# Patient Record
Sex: Female | Born: 1945
Health system: Southern US, Community
[De-identification: ages and names within clinical notes are randomized; demographics above are authoritative.]

## PROBLEM LIST (undated history)

## (undated) DIAGNOSIS — E119 Type 2 diabetes mellitus without complications: Secondary | ICD-10-CM

## (undated) DIAGNOSIS — M1990 Unspecified osteoarthritis, unspecified site: Secondary | ICD-10-CM

## (undated) DIAGNOSIS — K449 Diaphragmatic hernia without obstruction or gangrene: Secondary | ICD-10-CM

## (undated) DIAGNOSIS — K219 Gastro-esophageal reflux disease without esophagitis: Secondary | ICD-10-CM

## (undated) DIAGNOSIS — C50911 Malignant neoplasm of unspecified site of right female breast: Secondary | ICD-10-CM

## (undated) DIAGNOSIS — C801 Malignant (primary) neoplasm, unspecified: Secondary | ICD-10-CM

## (undated) DIAGNOSIS — C50919 Malignant neoplasm of unspecified site of unspecified female breast: Secondary | ICD-10-CM

## (undated) DIAGNOSIS — R112 Nausea with vomiting, unspecified: Secondary | ICD-10-CM

## (undated) DIAGNOSIS — I10 Essential (primary) hypertension: Secondary | ICD-10-CM

## (undated) DIAGNOSIS — I509 Heart failure, unspecified: Secondary | ICD-10-CM

## (undated) DIAGNOSIS — I428 Other cardiomyopathies: Secondary | ICD-10-CM

## (undated) DIAGNOSIS — R0600 Dyspnea, unspecified: Secondary | ICD-10-CM

## (undated) DIAGNOSIS — Z9889 Other specified postprocedural states: Secondary | ICD-10-CM

## (undated) DIAGNOSIS — R252 Cramp and spasm: Secondary | ICD-10-CM

## (undated) DIAGNOSIS — I2699 Other pulmonary embolism without acute cor pulmonale: Secondary | ICD-10-CM

## (undated) DIAGNOSIS — H269 Unspecified cataract: Secondary | ICD-10-CM

## (undated) DIAGNOSIS — I447 Left bundle-branch block, unspecified: Secondary | ICD-10-CM

## (undated) DIAGNOSIS — E7800 Pure hypercholesterolemia, unspecified: Secondary | ICD-10-CM

## (undated) DIAGNOSIS — R5383 Other fatigue: Secondary | ICD-10-CM

## (undated) DIAGNOSIS — Z789 Other specified health status: Secondary | ICD-10-CM

## (undated) DIAGNOSIS — E860 Dehydration: Secondary | ICD-10-CM

## (undated) DIAGNOSIS — T7840XA Allergy, unspecified, initial encounter: Secondary | ICD-10-CM

## (undated) DIAGNOSIS — C50912 Malignant neoplasm of unspecified site of left female breast: Secondary | ICD-10-CM

## (undated) HISTORY — PX: EYE SURGERY: SHX253

## (undated) HISTORY — PX: BREAST SURGERY: SHX581

## (undated) HISTORY — DX: Other cardiomyopathies: I42.8

## (undated) HISTORY — DX: Malignant neoplasm of unspecified site of unspecified female breast: C50.919

## (undated) HISTORY — PX: JOINT REPLACEMENT: SHX530

## (undated) HISTORY — DX: Left bundle-branch block, unspecified: I44.7

## (undated) HISTORY — DX: Malignant neoplasm of unspecified site of right female breast: C50.911

## (undated) HISTORY — DX: Type 2 diabetes mellitus without complications: E11.9

## (undated) HISTORY — DX: Other fatigue: R53.83

## (undated) HISTORY — DX: Malignant neoplasm of unspecified site of left female breast: C50.912

## (undated) HISTORY — PX: MASTECTOMY: SHX3

## (undated) HISTORY — PX: DILATION AND CURETTAGE OF UTERUS: SHX78

## (undated) HISTORY — DX: Allergy, unspecified, initial encounter: T78.40XA

---

## 1998-07-09 ENCOUNTER — Encounter: Payer: Self-pay | Admitting: General Surgery

## 1998-07-11 ENCOUNTER — Ambulatory Visit (HOSPITAL_COMMUNITY): Admission: RE | Admit: 1998-07-11 | Discharge: 1998-07-11 | Payer: Self-pay | Admitting: General Surgery

## 1998-07-24 ENCOUNTER — Other Ambulatory Visit: Admission: RE | Admit: 1998-07-24 | Discharge: 1998-07-24 | Payer: Self-pay | Admitting: General Surgery

## 1998-07-30 ENCOUNTER — Inpatient Hospital Stay (HOSPITAL_COMMUNITY): Admission: RE | Admit: 1998-07-30 | Discharge: 1998-07-31 | Payer: Self-pay | Admitting: General Surgery

## 1998-08-09 ENCOUNTER — Encounter: Payer: Self-pay | Admitting: Oncology

## 1998-08-09 ENCOUNTER — Ambulatory Visit (HOSPITAL_COMMUNITY): Admission: RE | Admit: 1998-08-09 | Discharge: 1998-08-09 | Payer: Self-pay | Admitting: Oncology

## 1998-08-13 ENCOUNTER — Other Ambulatory Visit: Admission: RE | Admit: 1998-08-13 | Discharge: 1998-08-13 | Payer: Self-pay | Admitting: Obstetrics & Gynecology

## 1998-08-20 ENCOUNTER — Ambulatory Visit (HOSPITAL_COMMUNITY): Admission: RE | Admit: 1998-08-20 | Discharge: 1998-08-20 | Payer: Self-pay | Admitting: General Surgery

## 1998-08-20 ENCOUNTER — Encounter: Payer: Self-pay | Admitting: General Surgery

## 1998-08-22 ENCOUNTER — Encounter: Payer: Self-pay | Admitting: General Surgery

## 1998-08-22 ENCOUNTER — Ambulatory Visit (HOSPITAL_COMMUNITY): Admission: RE | Admit: 1998-08-22 | Discharge: 1998-08-22 | Payer: Self-pay | Admitting: Hematology and Oncology

## 1998-08-22 ENCOUNTER — Encounter: Payer: Self-pay | Admitting: Hematology and Oncology

## 1998-09-07 ENCOUNTER — Encounter: Payer: Self-pay | Admitting: Hematology & Oncology

## 1998-09-07 ENCOUNTER — Inpatient Hospital Stay (HOSPITAL_COMMUNITY): Admission: EM | Admit: 1998-09-07 | Discharge: 1998-09-09 | Payer: Self-pay | Admitting: Emergency Medicine

## 1998-11-13 ENCOUNTER — Encounter: Payer: Self-pay | Admitting: Oncology

## 1998-11-13 ENCOUNTER — Ambulatory Visit (HOSPITAL_COMMUNITY): Admission: RE | Admit: 1998-11-13 | Discharge: 1998-11-13 | Payer: Self-pay | Admitting: Oncology

## 1998-11-14 ENCOUNTER — Ambulatory Visit (HOSPITAL_BASED_OUTPATIENT_CLINIC_OR_DEPARTMENT_OTHER): Admission: RE | Admit: 1998-11-14 | Discharge: 1998-11-14 | Payer: Self-pay | Admitting: General Surgery

## 1999-05-13 ENCOUNTER — Ambulatory Visit (HOSPITAL_COMMUNITY): Admission: RE | Admit: 1999-05-13 | Discharge: 1999-05-13 | Payer: Self-pay | Admitting: Oncology

## 1999-05-13 ENCOUNTER — Encounter: Payer: Self-pay | Admitting: Oncology

## 1999-09-11 ENCOUNTER — Encounter: Admission: RE | Admit: 1999-09-11 | Discharge: 1999-12-10 | Payer: Self-pay | Admitting: Podiatry

## 1999-10-06 DIAGNOSIS — I2699 Other pulmonary embolism without acute cor pulmonale: Secondary | ICD-10-CM

## 1999-10-06 HISTORY — PX: RECONSTRUCTION BREAST W/ TRAM FLAP: SUR1079

## 1999-10-06 HISTORY — DX: Other pulmonary embolism without acute cor pulmonale: I26.99

## 1999-10-09 ENCOUNTER — Encounter: Payer: Self-pay | Admitting: Oncology

## 1999-10-09 ENCOUNTER — Encounter: Admission: RE | Admit: 1999-10-09 | Discharge: 1999-10-10 | Payer: Self-pay | Admitting: Oncology

## 2000-03-29 ENCOUNTER — Encounter: Admission: RE | Admit: 2000-03-29 | Discharge: 2000-06-27 | Payer: Self-pay | Admitting: Family Medicine

## 2000-05-17 ENCOUNTER — Encounter: Payer: Self-pay | Admitting: Oncology

## 2000-05-17 ENCOUNTER — Encounter: Admission: RE | Admit: 2000-05-17 | Discharge: 2000-05-17 | Payer: Self-pay | Admitting: Oncology

## 2000-09-17 ENCOUNTER — Ambulatory Visit (HOSPITAL_COMMUNITY): Admission: RE | Admit: 2000-09-17 | Discharge: 2000-09-17 | Payer: Self-pay | Admitting: Family Medicine

## 2000-09-20 ENCOUNTER — Encounter: Admission: RE | Admit: 2000-09-20 | Discharge: 2000-12-19 | Payer: Self-pay | Admitting: Family Medicine

## 2001-05-11 ENCOUNTER — Ambulatory Visit (HOSPITAL_COMMUNITY): Admission: RE | Admit: 2001-05-11 | Discharge: 2001-05-11 | Payer: Self-pay | Admitting: Family Medicine

## 2001-05-11 ENCOUNTER — Encounter: Payer: Self-pay | Admitting: Family Medicine

## 2001-05-24 ENCOUNTER — Encounter: Payer: Self-pay | Admitting: Oncology

## 2001-05-24 ENCOUNTER — Ambulatory Visit (HOSPITAL_COMMUNITY): Admission: RE | Admit: 2001-05-24 | Discharge: 2001-05-24 | Payer: Self-pay | Admitting: Oncology

## 2002-05-26 ENCOUNTER — Encounter: Admission: RE | Admit: 2002-05-26 | Discharge: 2002-05-26 | Payer: Self-pay | Admitting: Oncology

## 2002-05-26 ENCOUNTER — Encounter: Payer: Self-pay | Admitting: Oncology

## 2002-08-24 ENCOUNTER — Ambulatory Visit (HOSPITAL_COMMUNITY): Admission: RE | Admit: 2002-08-24 | Discharge: 2002-08-24 | Payer: Self-pay | Admitting: Gastroenterology

## 2002-10-17 ENCOUNTER — Ambulatory Visit (HOSPITAL_COMMUNITY): Admission: RE | Admit: 2002-10-17 | Discharge: 2002-10-17 | Payer: Self-pay | Admitting: Gastroenterology

## 2002-12-14 ENCOUNTER — Other Ambulatory Visit: Admission: RE | Admit: 2002-12-14 | Discharge: 2002-12-14 | Payer: Self-pay | Admitting: *Deleted

## 2002-12-22 ENCOUNTER — Encounter: Payer: Self-pay | Admitting: Gastroenterology

## 2002-12-22 ENCOUNTER — Encounter: Admission: RE | Admit: 2002-12-22 | Discharge: 2002-12-22 | Payer: Self-pay | Admitting: Gastroenterology

## 2003-05-28 ENCOUNTER — Encounter: Payer: Self-pay | Admitting: Oncology

## 2003-05-28 ENCOUNTER — Ambulatory Visit (HOSPITAL_COMMUNITY): Admission: RE | Admit: 2003-05-28 | Discharge: 2003-05-28 | Payer: Self-pay | Admitting: Oncology

## 2003-10-17 ENCOUNTER — Encounter: Admission: RE | Admit: 2003-10-17 | Discharge: 2003-10-17 | Payer: Self-pay | Admitting: Family Medicine

## 2003-10-18 ENCOUNTER — Encounter: Admission: RE | Admit: 2003-10-18 | Discharge: 2003-10-24 | Payer: Self-pay | Admitting: Family Medicine

## 2004-02-07 ENCOUNTER — Other Ambulatory Visit: Admission: RE | Admit: 2004-02-07 | Discharge: 2004-02-07 | Payer: Self-pay | Admitting: *Deleted

## 2004-05-28 ENCOUNTER — Ambulatory Visit (HOSPITAL_COMMUNITY): Admission: RE | Admit: 2004-05-28 | Discharge: 2004-05-28 | Payer: Self-pay | Admitting: Oncology

## 2004-06-16 ENCOUNTER — Encounter (HOSPITAL_COMMUNITY): Admission: RE | Admit: 2004-06-16 | Discharge: 2004-07-02 | Payer: Self-pay | Admitting: *Deleted

## 2004-10-05 HISTORY — PX: KNEE SURGERY: SHX244

## 2004-10-08 ENCOUNTER — Ambulatory Visit: Payer: Self-pay | Admitting: Internal Medicine

## 2004-10-17 ENCOUNTER — Ambulatory Visit: Payer: Self-pay | Admitting: Internal Medicine

## 2004-10-24 ENCOUNTER — Ambulatory Visit: Payer: Self-pay | Admitting: Internal Medicine

## 2004-10-30 ENCOUNTER — Ambulatory Visit: Payer: Self-pay | Admitting: Internal Medicine

## 2004-11-13 ENCOUNTER — Ambulatory Visit: Payer: Self-pay | Admitting: Internal Medicine

## 2004-11-27 ENCOUNTER — Ambulatory Visit: Payer: Self-pay | Admitting: Internal Medicine

## 2004-12-04 ENCOUNTER — Ambulatory Visit: Payer: Self-pay | Admitting: Internal Medicine

## 2004-12-18 ENCOUNTER — Ambulatory Visit: Payer: Self-pay | Admitting: Internal Medicine

## 2005-01-01 ENCOUNTER — Ambulatory Visit: Payer: Self-pay | Admitting: Internal Medicine

## 2005-01-07 ENCOUNTER — Ambulatory Visit: Payer: Self-pay | Admitting: Internal Medicine

## 2005-01-07 ENCOUNTER — Encounter: Admission: RE | Admit: 2005-01-07 | Discharge: 2005-01-07 | Payer: Self-pay | Admitting: Family Medicine

## 2005-01-19 ENCOUNTER — Ambulatory Visit: Payer: Self-pay | Admitting: Internal Medicine

## 2005-02-02 ENCOUNTER — Ambulatory Visit: Payer: Self-pay | Admitting: Internal Medicine

## 2005-02-13 ENCOUNTER — Ambulatory Visit: Payer: Self-pay | Admitting: Internal Medicine

## 2005-02-19 ENCOUNTER — Encounter: Admission: RE | Admit: 2005-02-19 | Discharge: 2005-04-20 | Payer: Self-pay | Admitting: Orthopaedic Surgery

## 2005-02-26 ENCOUNTER — Ambulatory Visit: Payer: Self-pay | Admitting: Internal Medicine

## 2005-03-09 ENCOUNTER — Ambulatory Visit: Payer: Self-pay | Admitting: Oncology

## 2005-03-09 ENCOUNTER — Ambulatory Visit: Payer: Self-pay | Admitting: Internal Medicine

## 2005-03-20 ENCOUNTER — Ambulatory Visit: Payer: Self-pay | Admitting: Internal Medicine

## 2005-03-30 ENCOUNTER — Ambulatory Visit: Payer: Self-pay | Admitting: Internal Medicine

## 2005-04-17 ENCOUNTER — Ambulatory Visit: Payer: Self-pay | Admitting: Internal Medicine

## 2005-04-30 ENCOUNTER — Ambulatory Visit: Payer: Self-pay | Admitting: Internal Medicine

## 2005-05-07 ENCOUNTER — Ambulatory Visit: Payer: Self-pay | Admitting: Internal Medicine

## 2005-05-14 ENCOUNTER — Ambulatory Visit: Payer: Self-pay | Admitting: Internal Medicine

## 2005-05-19 ENCOUNTER — Ambulatory Visit: Payer: Self-pay | Admitting: Internal Medicine

## 2005-06-01 ENCOUNTER — Ambulatory Visit: Payer: Self-pay | Admitting: Internal Medicine

## 2005-06-12 ENCOUNTER — Ambulatory Visit: Payer: Self-pay | Admitting: Internal Medicine

## 2005-06-22 ENCOUNTER — Ambulatory Visit: Payer: Self-pay | Admitting: Internal Medicine

## 2005-06-23 ENCOUNTER — Ambulatory Visit: Payer: Self-pay | Admitting: Internal Medicine

## 2005-06-30 ENCOUNTER — Ambulatory Visit: Payer: Self-pay | Admitting: Internal Medicine

## 2005-07-08 ENCOUNTER — Encounter: Admission: RE | Admit: 2005-07-08 | Discharge: 2005-07-08 | Payer: Self-pay | Admitting: Family Medicine

## 2005-07-08 ENCOUNTER — Ambulatory Visit: Payer: Self-pay | Admitting: Internal Medicine

## 2005-07-15 ENCOUNTER — Ambulatory Visit: Payer: Self-pay | Admitting: Internal Medicine

## 2005-07-20 ENCOUNTER — Ambulatory Visit: Payer: Self-pay | Admitting: Internal Medicine

## 2005-07-31 ENCOUNTER — Ambulatory Visit: Payer: Self-pay | Admitting: Internal Medicine

## 2005-08-06 ENCOUNTER — Ambulatory Visit: Payer: Self-pay | Admitting: Internal Medicine

## 2005-08-14 ENCOUNTER — Ambulatory Visit: Payer: Self-pay | Admitting: Internal Medicine

## 2005-08-21 ENCOUNTER — Ambulatory Visit: Payer: Self-pay | Admitting: Internal Medicine

## 2005-08-28 ENCOUNTER — Ambulatory Visit: Payer: Self-pay | Admitting: Internal Medicine

## 2005-09-04 ENCOUNTER — Ambulatory Visit: Payer: Self-pay | Admitting: Internal Medicine

## 2005-09-11 ENCOUNTER — Ambulatory Visit: Payer: Self-pay | Admitting: Internal Medicine

## 2005-09-18 ENCOUNTER — Ambulatory Visit: Payer: Self-pay | Admitting: Internal Medicine

## 2005-09-25 ENCOUNTER — Ambulatory Visit: Payer: Self-pay | Admitting: Internal Medicine

## 2005-10-02 ENCOUNTER — Ambulatory Visit: Payer: Self-pay | Admitting: Internal Medicine

## 2005-10-13 ENCOUNTER — Ambulatory Visit: Payer: Self-pay | Admitting: Internal Medicine

## 2005-10-20 ENCOUNTER — Ambulatory Visit: Payer: Self-pay | Admitting: Internal Medicine

## 2005-11-06 ENCOUNTER — Ambulatory Visit: Payer: Self-pay | Admitting: Internal Medicine

## 2005-11-19 ENCOUNTER — Ambulatory Visit: Payer: Self-pay | Admitting: Internal Medicine

## 2005-11-26 ENCOUNTER — Ambulatory Visit: Payer: Self-pay | Admitting: Internal Medicine

## 2005-12-03 ENCOUNTER — Ambulatory Visit: Payer: Self-pay | Admitting: Internal Medicine

## 2005-12-14 ENCOUNTER — Ambulatory Visit: Payer: Self-pay | Admitting: Internal Medicine

## 2005-12-15 ENCOUNTER — Ambulatory Visit: Payer: Self-pay | Admitting: Internal Medicine

## 2005-12-22 ENCOUNTER — Ambulatory Visit: Payer: Self-pay | Admitting: Internal Medicine

## 2006-01-13 ENCOUNTER — Ambulatory Visit: Payer: Self-pay | Admitting: Internal Medicine

## 2006-01-20 ENCOUNTER — Ambulatory Visit: Payer: Self-pay | Admitting: Internal Medicine

## 2006-01-27 ENCOUNTER — Ambulatory Visit: Payer: Self-pay | Admitting: Internal Medicine

## 2006-02-10 ENCOUNTER — Ambulatory Visit: Payer: Self-pay | Admitting: Internal Medicine

## 2006-02-19 ENCOUNTER — Ambulatory Visit: Payer: Self-pay | Admitting: Internal Medicine

## 2006-02-26 ENCOUNTER — Ambulatory Visit: Payer: Self-pay | Admitting: Internal Medicine

## 2006-03-03 ENCOUNTER — Ambulatory Visit: Payer: Self-pay | Admitting: Internal Medicine

## 2006-03-15 ENCOUNTER — Ambulatory Visit: Payer: Self-pay | Admitting: Internal Medicine

## 2006-03-18 ENCOUNTER — Ambulatory Visit: Payer: Self-pay | Admitting: Oncology

## 2006-03-22 LAB — COMPREHENSIVE METABOLIC PANEL
Albumin: 4 g/dL (ref 3.5–5.2)
BUN: 14 mg/dL (ref 6–23)
Calcium: 8.8 mg/dL (ref 8.4–10.5)
Chloride: 109 mEq/L (ref 96–112)
Glucose, Bld: 192 mg/dL — ABNORMAL HIGH (ref 70–99)
Potassium: 3.7 mEq/L (ref 3.5–5.3)
Sodium: 144 mEq/L (ref 135–145)
Total Protein: 6.8 g/dL (ref 6.0–8.3)

## 2006-03-22 LAB — CBC WITH DIFFERENTIAL/PLATELET
Basophils Absolute: 0 10*3/uL (ref 0.0–0.1)
Eosinophils Absolute: 0.1 10*3/uL (ref 0.0–0.5)
HGB: 12.7 g/dL (ref 11.6–15.9)
MONO#: 0.3 10*3/uL (ref 0.1–0.9)
NEUT#: 3.3 10*3/uL (ref 1.5–6.5)
RBC: 4.09 10*6/uL (ref 3.70–5.32)
RDW: 13.3 % (ref 11.3–14.5)
WBC: 6.1 10*3/uL (ref 3.9–10.0)
lymph#: 2.3 10*3/uL (ref 0.9–3.3)

## 2006-03-30 ENCOUNTER — Ambulatory Visit: Payer: Self-pay | Admitting: Internal Medicine

## 2006-04-06 ENCOUNTER — Ambulatory Visit: Payer: Self-pay | Admitting: Internal Medicine

## 2006-04-13 ENCOUNTER — Encounter: Admission: RE | Admit: 2006-04-13 | Discharge: 2006-07-12 | Payer: Self-pay | Admitting: Orthopaedic Surgery

## 2006-04-15 ENCOUNTER — Ambulatory Visit: Payer: Self-pay | Admitting: Internal Medicine

## 2006-04-27 ENCOUNTER — Ambulatory Visit: Payer: Self-pay | Admitting: Internal Medicine

## 2006-05-06 ENCOUNTER — Ambulatory Visit: Payer: Self-pay | Admitting: Internal Medicine

## 2006-07-13 ENCOUNTER — Encounter: Admission: RE | Admit: 2006-07-13 | Discharge: 2006-10-11 | Payer: Self-pay | Admitting: Orthopaedic Surgery

## 2006-10-12 ENCOUNTER — Encounter: Admission: RE | Admit: 2006-10-12 | Discharge: 2006-11-11 | Payer: Self-pay | Admitting: Orthopaedic Surgery

## 2006-11-12 ENCOUNTER — Ambulatory Visit: Payer: Self-pay | Admitting: Gastroenterology

## 2006-11-17 ENCOUNTER — Ambulatory Visit: Payer: Self-pay | Admitting: Gastroenterology

## 2007-01-18 ENCOUNTER — Encounter: Admission: RE | Admit: 2007-01-18 | Discharge: 2007-01-18 | Payer: Self-pay | Admitting: Radiology

## 2007-01-28 ENCOUNTER — Ambulatory Visit: Payer: Self-pay | Admitting: Oncology

## 2007-02-02 ENCOUNTER — Encounter: Admission: RE | Admit: 2007-02-02 | Discharge: 2007-02-02 | Payer: Self-pay | Admitting: General Surgery

## 2007-02-04 ENCOUNTER — Ambulatory Visit (HOSPITAL_BASED_OUTPATIENT_CLINIC_OR_DEPARTMENT_OTHER): Admission: RE | Admit: 2007-02-04 | Discharge: 2007-02-05 | Payer: Self-pay | Admitting: General Surgery

## 2007-02-04 ENCOUNTER — Encounter (INDEPENDENT_AMBULATORY_CARE_PROVIDER_SITE_OTHER): Payer: Self-pay | Admitting: Specialist

## 2007-02-04 ENCOUNTER — Ambulatory Visit (HOSPITAL_COMMUNITY): Admission: RE | Admit: 2007-02-04 | Discharge: 2007-02-04 | Payer: Self-pay | Admitting: General Surgery

## 2007-02-24 ENCOUNTER — Ambulatory Visit (HOSPITAL_COMMUNITY): Admission: RE | Admit: 2007-02-24 | Discharge: 2007-02-24 | Payer: Self-pay | Admitting: Oncology

## 2007-03-01 ENCOUNTER — Ambulatory Visit (HOSPITAL_COMMUNITY): Admission: RE | Admit: 2007-03-01 | Discharge: 2007-03-01 | Payer: Self-pay | Admitting: Oncology

## 2007-03-01 LAB — CBC WITH DIFFERENTIAL/PLATELET
BASO%: 0.5 % (ref 0.0–2.0)
Basophils Absolute: 0 10*3/uL (ref 0.0–0.1)
EOS%: 4.3 % (ref 0.0–7.0)
HGB: 12.4 g/dL (ref 11.6–15.9)
MCH: 31.6 pg (ref 26.0–34.0)
MCHC: 34.8 g/dL (ref 32.0–36.0)
MCV: 90.8 fL (ref 81.0–101.0)
MONO%: 6 % (ref 0.0–13.0)
NEUT%: 50.1 % (ref 39.6–76.8)
RDW: 13.1 % (ref 11.3–14.5)

## 2007-03-01 LAB — COMPREHENSIVE METABOLIC PANEL
AST: 12 U/L (ref 0–37)
Alkaline Phosphatase: 99 U/L (ref 39–117)
BUN: 17 mg/dL (ref 6–23)
Creatinine, Ser: 0.73 mg/dL (ref 0.40–1.20)
Potassium: 3.6 mEq/L (ref 3.5–5.3)

## 2007-03-07 ENCOUNTER — Ambulatory Visit: Payer: Self-pay | Admitting: Oncology

## 2007-03-16 LAB — CBC WITH DIFFERENTIAL/PLATELET
Basophils Absolute: 0 10*3/uL (ref 0.0–0.1)
EOS%: 0.6 % (ref 0.0–7.0)
HCT: 33.8 % — ABNORMAL LOW (ref 34.8–46.6)
HGB: 11.9 g/dL (ref 11.6–15.9)
LYMPH%: 62.4 % — ABNORMAL HIGH (ref 14.0–48.0)
MCH: 31.8 pg (ref 26.0–34.0)
MCV: 90.3 fL (ref 81.0–101.0)
MONO%: 18.2 % — ABNORMAL HIGH (ref 0.0–13.0)
NEUT%: 18 % — ABNORMAL LOW (ref 39.6–76.8)

## 2007-03-16 LAB — COMPREHENSIVE METABOLIC PANEL
AST: 13 U/L (ref 0–37)
Alkaline Phosphatase: 93 U/L (ref 39–117)
BUN: 18 mg/dL (ref 6–23)
Calcium: 8.9 mg/dL (ref 8.4–10.5)
Creatinine, Ser: 0.62 mg/dL (ref 0.40–1.20)
Total Bilirubin: 0.3 mg/dL (ref 0.3–1.2)

## 2007-03-23 LAB — CBC WITH DIFFERENTIAL/PLATELET
BASO%: 0.2 % (ref 0.0–2.0)
EOS%: 0 % (ref 0.0–7.0)
MCH: 31.5 pg (ref 26.0–34.0)
MCHC: 35.3 g/dL (ref 32.0–36.0)
MCV: 89.2 fL (ref 81.0–101.0)
MONO%: 3 % (ref 0.0–13.0)
NEUT%: 82.4 % — ABNORMAL HIGH (ref 39.6–76.8)
RDW: 11.9 % (ref 11.3–14.5)
lymph#: 1.8 10*3/uL (ref 0.9–3.3)

## 2007-03-28 ENCOUNTER — Ambulatory Visit: Payer: Self-pay | Admitting: Vascular Surgery

## 2007-03-29 ENCOUNTER — Encounter: Payer: Self-pay | Admitting: Oncology

## 2007-03-29 ENCOUNTER — Ambulatory Visit (HOSPITAL_COMMUNITY): Admission: RE | Admit: 2007-03-29 | Discharge: 2007-03-29 | Payer: Self-pay | Admitting: Oncology

## 2007-03-29 LAB — COMPREHENSIVE METABOLIC PANEL
ALT: 26 U/L (ref 0–35)
BUN: 17 mg/dL (ref 6–23)
CO2: 24 mEq/L (ref 19–32)
Calcium: 8.6 mg/dL (ref 8.4–10.5)
Creatinine, Ser: 0.85 mg/dL (ref 0.40–1.20)
Total Bilirubin: 0.4 mg/dL (ref 0.3–1.2)

## 2007-03-29 LAB — PROTIME-INR: INR: 1.1 — ABNORMAL LOW (ref 2.00–3.50)

## 2007-03-31 LAB — CBC WITH DIFFERENTIAL/PLATELET
Basophils Absolute: 0 10*3/uL (ref 0.0–0.1)
Eosinophils Absolute: 0 10*3/uL (ref 0.0–0.5)
HCT: 31.9 % — ABNORMAL LOW (ref 34.8–46.6)
HGB: 11.1 g/dL — ABNORMAL LOW (ref 11.6–15.9)
LYMPH%: 26.8 % (ref 14.0–48.0)
MCHC: 35 g/dL (ref 32.0–36.0)
MONO#: 0.9 10*3/uL (ref 0.1–0.9)
NEUT#: 5.8 10*3/uL (ref 1.5–6.5)
NEUT%: 63.1 % (ref 39.6–76.8)
Platelets: 132 10*3/uL — ABNORMAL LOW (ref 145–400)
WBC: 9.2 10*3/uL (ref 3.9–10.0)
lymph#: 2.5 10*3/uL (ref 0.9–3.3)

## 2007-04-01 ENCOUNTER — Ambulatory Visit (HOSPITAL_COMMUNITY): Admission: RE | Admit: 2007-04-01 | Discharge: 2007-04-01 | Payer: Self-pay | Admitting: Oncology

## 2007-04-04 LAB — PROTIME-INR

## 2007-04-05 LAB — CBC WITH DIFFERENTIAL/PLATELET
BASO%: 0.7 % (ref 0.0–2.0)
EOS%: 0.5 % (ref 0.0–7.0)
HCT: 32.1 % — ABNORMAL LOW (ref 34.8–46.6)
LYMPH%: 27.2 % (ref 14.0–48.0)
MCH: 31.2 pg (ref 26.0–34.0)
MCHC: 35.1 g/dL (ref 32.0–36.0)
NEUT%: 65.6 % (ref 39.6–76.8)
Platelets: 230 10*3/uL (ref 145–400)

## 2007-04-05 LAB — LACTATE DEHYDROGENASE: LDH: 175 U/L (ref 94–250)

## 2007-04-05 LAB — COMPREHENSIVE METABOLIC PANEL
ALT: 36 U/L — ABNORMAL HIGH (ref 0–35)
AST: 15 U/L (ref 0–37)
BUN: 14 mg/dL (ref 6–23)
CO2: 26 mEq/L (ref 19–32)
Creatinine, Ser: 0.65 mg/dL (ref 0.40–1.20)
Total Bilirubin: 0.2 mg/dL — ABNORMAL LOW (ref 0.3–1.2)

## 2007-04-07 LAB — PROTIME-INR: INR: 1.6 — ABNORMAL LOW (ref 2.00–3.50)

## 2007-04-11 LAB — PROTIME-INR: Protime: 24 Seconds — ABNORMAL HIGH (ref 10.6–13.4)

## 2007-04-13 LAB — CBC WITH DIFFERENTIAL/PLATELET
Eosinophils Absolute: 0 10*3/uL (ref 0.0–0.5)
HGB: 12 g/dL (ref 11.6–15.9)
MONO#: 0.1 10*3/uL (ref 0.1–0.9)
NEUT#: 7.4 10*3/uL — ABNORMAL HIGH (ref 1.5–6.5)
RBC: 3.75 10*6/uL (ref 3.70–5.32)
RDW: 12.7 % (ref 11.3–14.5)
WBC: 8.6 10*3/uL (ref 3.9–10.0)
lymph#: 1 10*3/uL (ref 0.9–3.3)

## 2007-04-13 LAB — PROTIME-INR: Protime: 24 Seconds — ABNORMAL HIGH (ref 10.6–13.4)

## 2007-04-20 ENCOUNTER — Ambulatory Visit: Payer: Self-pay | Admitting: Oncology

## 2007-04-20 LAB — PROTIME-INR
INR: 2.5 (ref 2.00–3.50)
Protime: 30 Seconds — ABNORMAL HIGH (ref 10.6–13.4)

## 2007-05-04 LAB — CBC WITH DIFFERENTIAL/PLATELET
Eosinophils Absolute: 0 10*3/uL (ref 0.0–0.5)
MCV: 90.6 fL (ref 81.0–101.0)
MONO#: 0.3 10*3/uL (ref 0.1–0.9)
MONO%: 3.5 % (ref 0.0–13.0)
NEUT#: 8.3 10*3/uL — ABNORMAL HIGH (ref 1.5–6.5)
RBC: 3.47 10*6/uL — ABNORMAL LOW (ref 3.70–5.32)
RDW: 13.4 % (ref 11.3–14.5)
WBC: 9.7 10*3/uL (ref 3.9–10.0)
lymph#: 1.1 10*3/uL (ref 0.9–3.3)

## 2007-05-17 LAB — COMPREHENSIVE METABOLIC PANEL
Albumin: 4.1 g/dL (ref 3.5–5.2)
CO2: 22 mEq/L (ref 19–32)
Calcium: 8.7 mg/dL (ref 8.4–10.5)
Glucose, Bld: 153 mg/dL — ABNORMAL HIGH (ref 70–99)
Potassium: 3.6 mEq/L (ref 3.5–5.3)
Sodium: 141 mEq/L (ref 135–145)
Total Bilirubin: 0.3 mg/dL (ref 0.3–1.2)
Total Protein: 6.6 g/dL (ref 6.0–8.3)

## 2007-05-17 LAB — CBC WITH DIFFERENTIAL/PLATELET
Eosinophils Absolute: 0 10*3/uL (ref 0.0–0.5)
LYMPH%: 23.6 % (ref 14.0–48.0)
MONO#: 0.4 10*3/uL (ref 0.1–0.9)
NEUT#: 4.6 10*3/uL (ref 1.5–6.5)
Platelets: 178 10*3/uL (ref 145–400)
RBC: 3.45 10*6/uL — ABNORMAL LOW (ref 3.70–5.32)
WBC: 6.6 10*3/uL (ref 3.9–10.0)

## 2007-05-17 LAB — LACTATE DEHYDROGENASE: LDH: 218 U/L (ref 94–250)

## 2007-05-17 LAB — PROTIME-INR: INR: 1.2 — ABNORMAL LOW (ref 2.00–3.50)

## 2007-05-24 LAB — PROTIME-INR: Protime: 20.4 Seconds — ABNORMAL HIGH (ref 10.6–13.4)

## 2007-05-31 ENCOUNTER — Ambulatory Visit: Admission: RE | Admit: 2007-05-31 | Discharge: 2007-06-28 | Payer: Self-pay | Admitting: Radiation Oncology

## 2007-06-08 ENCOUNTER — Ambulatory Visit: Payer: Self-pay | Admitting: Oncology

## 2007-06-08 LAB — PROTIME-INR: Protime: 34.8 Seconds — ABNORMAL HIGH (ref 10.6–13.4)

## 2007-06-20 LAB — COMPREHENSIVE METABOLIC PANEL
Alkaline Phosphatase: 97 U/L (ref 39–117)
BUN: 21 mg/dL (ref 6–23)
Creatinine, Ser: 0.68 mg/dL (ref 0.40–1.20)
Glucose, Bld: 151 mg/dL — ABNORMAL HIGH (ref 70–99)
Total Bilirubin: 0.3 mg/dL (ref 0.3–1.2)

## 2007-06-20 LAB — CBC WITH DIFFERENTIAL/PLATELET
Basophils Absolute: 0.1 10*3/uL (ref 0.0–0.1)
Eosinophils Absolute: 0.1 10*3/uL (ref 0.0–0.5)
HCT: 33 % — ABNORMAL LOW (ref 34.8–46.6)
LYMPH%: 36.1 % (ref 14.0–48.0)
MCV: 92.7 fL (ref 81.0–101.0)
MONO#: 0.4 10*3/uL (ref 0.1–0.9)
MONO%: 7.8 % (ref 0.0–13.0)
NEUT#: 2.8 10*3/uL (ref 1.5–6.5)
NEUT%: 52 % (ref 39.6–76.8)
Platelets: 254 10*3/uL (ref 145–400)
RBC: 3.56 10*6/uL — ABNORMAL LOW (ref 3.70–5.32)
WBC: 5.4 10*3/uL (ref 3.9–10.0)

## 2007-06-20 LAB — LACTATE DEHYDROGENASE: LDH: 189 U/L (ref 94–250)

## 2007-06-20 LAB — PROTIME-INR: Protime: 51.6 Seconds — ABNORMAL HIGH (ref 10.6–13.4)

## 2007-06-27 LAB — PROTIME-INR
INR: 2.6 (ref 2.00–3.50)
Protime: 31.2 Seconds — ABNORMAL HIGH (ref 10.6–13.4)

## 2007-07-18 LAB — CBC WITH DIFFERENTIAL/PLATELET
BASO%: 1.2 % (ref 0.0–2.0)
Eosinophils Absolute: 0.2 10*3/uL (ref 0.0–0.5)
MCHC: 35.4 g/dL (ref 32.0–36.0)
MCV: 94.4 fL (ref 81.0–101.0)
MONO#: 0.3 10*3/uL (ref 0.1–0.9)
MONO%: 5.6 % (ref 0.0–13.0)
NEUT#: 2.8 10*3/uL (ref 1.5–6.5)
RBC: 3.71 10*6/uL (ref 3.70–5.32)
RDW: 10.4 % — ABNORMAL LOW (ref 11.3–14.5)
WBC: 6.1 10*3/uL (ref 3.9–10.0)

## 2007-07-18 LAB — PROTIME-INR
INR: 2.2 (ref 2.00–3.50)
Protime: 26.4 Seconds — ABNORMAL HIGH (ref 10.6–13.4)

## 2007-10-07 ENCOUNTER — Ambulatory Visit: Payer: Self-pay | Admitting: Oncology

## 2007-10-11 LAB — CBC WITH DIFFERENTIAL/PLATELET
Basophils Absolute: 0 10*3/uL (ref 0.0–0.1)
Eosinophils Absolute: 0.1 10*3/uL (ref 0.0–0.5)
HGB: 12.2 g/dL (ref 11.6–15.9)
MCV: 91.9 fL (ref 81.0–101.0)
MONO#: 0.5 10*3/uL (ref 0.1–0.9)
NEUT#: 4.1 10*3/uL (ref 1.5–6.5)
RBC: 3.82 10*6/uL (ref 3.70–5.32)
RDW: 13.5 % (ref 11.3–14.5)
WBC: 7.3 10*3/uL (ref 3.9–10.0)
lymph#: 2.6 10*3/uL (ref 0.9–3.3)

## 2007-10-11 LAB — COMPREHENSIVE METABOLIC PANEL
Albumin: 4.2 g/dL (ref 3.5–5.2)
Alkaline Phosphatase: 97 U/L (ref 39–117)
BUN: 25 mg/dL — ABNORMAL HIGH (ref 6–23)
CO2: 26 mEq/L (ref 19–32)
Calcium: 9.3 mg/dL (ref 8.4–10.5)
Chloride: 104 mEq/L (ref 96–112)
Glucose, Bld: 131 mg/dL — ABNORMAL HIGH (ref 70–99)
Potassium: 3.3 mEq/L — ABNORMAL LOW (ref 3.5–5.3)
Sodium: 141 mEq/L (ref 135–145)
Total Protein: 6.9 g/dL (ref 6.0–8.3)

## 2007-12-08 ENCOUNTER — Encounter (INDEPENDENT_AMBULATORY_CARE_PROVIDER_SITE_OTHER): Payer: Self-pay | Admitting: Diagnostic Radiology

## 2007-12-08 ENCOUNTER — Ambulatory Visit (HOSPITAL_COMMUNITY): Admission: RE | Admit: 2007-12-08 | Discharge: 2007-12-08 | Payer: Self-pay | Admitting: Oncology

## 2007-12-14 ENCOUNTER — Ambulatory Visit (HOSPITAL_COMMUNITY): Admission: RE | Admit: 2007-12-14 | Discharge: 2007-12-14 | Payer: Self-pay | Admitting: Oncology

## 2007-12-28 ENCOUNTER — Ambulatory Visit: Payer: Self-pay | Admitting: Oncology

## 2008-02-21 ENCOUNTER — Ambulatory Visit: Payer: Self-pay | Admitting: Oncology

## 2008-04-20 ENCOUNTER — Ambulatory Visit: Payer: Self-pay | Admitting: Oncology

## 2008-04-25 LAB — CBC WITH DIFFERENTIAL/PLATELET
Basophils Absolute: 0 10*3/uL (ref 0.0–0.1)
EOS%: 1.9 % (ref 0.0–7.0)
Eosinophils Absolute: 0.1 10*3/uL (ref 0.0–0.5)
HCT: 36.7 % (ref 34.8–46.6)
HGB: 12.7 g/dL (ref 11.6–15.9)
MCH: 31.5 pg (ref 26.0–34.0)
MCV: 90.9 fL (ref 81.0–101.0)
NEUT%: 57.1 % (ref 39.6–76.8)
lymph#: 2 10*3/uL (ref 0.9–3.3)

## 2008-04-25 LAB — COMPREHENSIVE METABOLIC PANEL
AST: 13 U/L (ref 0–37)
BUN: 16 mg/dL (ref 6–23)
Calcium: 9.6 mg/dL (ref 8.4–10.5)
Chloride: 104 mEq/L (ref 96–112)
Creatinine, Ser: 0.75 mg/dL (ref 0.40–1.20)
Glucose, Bld: 207 mg/dL — ABNORMAL HIGH (ref 70–99)

## 2008-04-25 LAB — LACTATE DEHYDROGENASE: LDH: 156 U/L (ref 94–250)

## 2008-04-27 ENCOUNTER — Ambulatory Visit (HOSPITAL_COMMUNITY): Admission: RE | Admit: 2008-04-27 | Discharge: 2008-04-27 | Payer: Self-pay | Admitting: Oncology

## 2008-05-07 ENCOUNTER — Ambulatory Visit: Payer: Self-pay | Admitting: Cardiology

## 2008-05-16 ENCOUNTER — Ambulatory Visit: Payer: Self-pay

## 2008-05-16 ENCOUNTER — Encounter: Payer: Self-pay | Admitting: Cardiology

## 2008-06-12 ENCOUNTER — Ambulatory Visit: Payer: Self-pay | Admitting: Cardiology

## 2008-07-02 ENCOUNTER — Ambulatory Visit: Payer: Self-pay | Admitting: Oncology

## 2008-07-04 LAB — COMPREHENSIVE METABOLIC PANEL
ALT: 21 U/L (ref 0–35)
AST: 15 U/L (ref 0–37)
Albumin: 4.3 g/dL (ref 3.5–5.2)
Alkaline Phosphatase: 95 U/L (ref 39–117)
BUN: 15 mg/dL (ref 6–23)
CO2: 27 mEq/L (ref 19–32)
Calcium: 9.8 mg/dL (ref 8.4–10.5)
Chloride: 102 mEq/L (ref 96–112)
Creatinine, Ser: 0.74 mg/dL (ref 0.40–1.20)
Glucose, Bld: 113 mg/dL — ABNORMAL HIGH (ref 70–99)
Potassium: 3.2 mEq/L — ABNORMAL LOW (ref 3.5–5.3)
Sodium: 141 mEq/L (ref 135–145)
Total Bilirubin: 0.4 mg/dL (ref 0.3–1.2)
Total Protein: 6.7 g/dL (ref 6.0–8.3)

## 2008-07-04 LAB — CBC WITH DIFFERENTIAL/PLATELET
Eosinophils Absolute: 0.1 10*3/uL (ref 0.0–0.5)
MCV: 91.9 fL (ref 81.0–101.0)
MONO#: 0.5 10*3/uL (ref 0.1–0.9)
MONO%: 7.7 % (ref 0.0–13.0)
NEUT#: 3.3 10*3/uL (ref 1.5–6.5)
RBC: 3.99 10*6/uL (ref 3.70–5.32)
RDW: 12.9 % (ref 11.3–14.5)
WBC: 6.6 10*3/uL (ref 3.9–10.0)
lymph#: 2.7 10*3/uL (ref 0.9–3.3)

## 2008-07-04 LAB — CANCER ANTIGEN 27.29: CA 27.29: 25 U/mL (ref 0–39)

## 2008-09-03 ENCOUNTER — Ambulatory Visit: Payer: Self-pay | Admitting: Oncology

## 2008-09-05 LAB — CBC WITH DIFFERENTIAL/PLATELET
BASO%: 0.4 % (ref 0.0–2.0)
Eosinophils Absolute: 0.1 10*3/uL (ref 0.0–0.5)
LYMPH%: 41.5 % (ref 14.0–48.0)
MCHC: 34.4 g/dL (ref 32.0–36.0)
MONO#: 0.5 10*3/uL (ref 0.1–0.9)
MONO%: 6.6 % (ref 0.0–13.0)
NEUT#: 3.7 10*3/uL (ref 1.5–6.5)
RBC: 4.04 10*6/uL (ref 3.70–5.32)
RDW: 12.7 % (ref 11.3–14.5)
WBC: 7.4 10*3/uL (ref 3.9–10.0)

## 2008-09-05 LAB — COMPREHENSIVE METABOLIC PANEL
ALT: 20 U/L (ref 0–35)
Albumin: 4.5 g/dL (ref 3.5–5.2)
Alkaline Phosphatase: 112 U/L (ref 39–117)
CO2: 26 mEq/L (ref 19–32)
Glucose, Bld: 167 mg/dL — ABNORMAL HIGH (ref 70–99)
Potassium: 3 mEq/L — ABNORMAL LOW (ref 3.5–5.3)
Sodium: 140 mEq/L (ref 135–145)
Total Bilirubin: 0.3 mg/dL (ref 0.3–1.2)
Total Protein: 7.1 g/dL (ref 6.0–8.3)

## 2008-09-05 LAB — LACTATE DEHYDROGENASE: LDH: 157 U/L (ref 94–250)

## 2008-09-19 ENCOUNTER — Ambulatory Visit (HOSPITAL_COMMUNITY): Admission: RE | Admit: 2008-09-19 | Discharge: 2008-09-19 | Payer: Self-pay | Admitting: Oncology

## 2008-09-19 DIAGNOSIS — I10 Essential (primary) hypertension: Secondary | ICD-10-CM

## 2008-09-19 DIAGNOSIS — E785 Hyperlipidemia, unspecified: Secondary | ICD-10-CM

## 2008-09-26 LAB — BASIC METABOLIC PANEL
BUN: 15 mg/dL (ref 6–23)
Calcium: 9.3 mg/dL (ref 8.4–10.5)
Creatinine, Ser: 0.81 mg/dL (ref 0.40–1.20)

## 2009-01-16 ENCOUNTER — Ambulatory Visit: Payer: Self-pay | Admitting: Oncology

## 2009-01-18 LAB — COMPREHENSIVE METABOLIC PANEL
Albumin: 4.1 g/dL (ref 3.5–5.2)
BUN: 15 mg/dL (ref 6–23)
CO2: 24 mEq/L (ref 19–32)
Calcium: 9.5 mg/dL (ref 8.4–10.5)
Chloride: 102 mEq/L (ref 96–112)
Glucose, Bld: 236 mg/dL — ABNORMAL HIGH (ref 70–99)
Potassium: 3.1 mEq/L — ABNORMAL LOW (ref 3.5–5.3)
Sodium: 140 mEq/L (ref 135–145)
Total Protein: 7.1 g/dL (ref 6.0–8.3)

## 2009-01-18 LAB — CBC WITH DIFFERENTIAL/PLATELET
Basophils Absolute: 0 10*3/uL (ref 0.0–0.1)
Eosinophils Absolute: 0.1 10*3/uL (ref 0.0–0.5)
HGB: 13 g/dL (ref 11.6–15.9)
MCV: 92.1 fL (ref 79.5–101.0)
MONO#: 0.8 10*3/uL (ref 0.1–0.9)
NEUT#: 6.5 10*3/uL (ref 1.5–6.5)
RBC: 4.15 10*6/uL (ref 3.70–5.45)
RDW: 13 % (ref 11.2–14.5)
WBC: 10.6 10*3/uL — ABNORMAL HIGH (ref 3.9–10.3)
lymph#: 3.1 10*3/uL (ref 0.9–3.3)

## 2009-02-27 ENCOUNTER — Encounter: Admission: RE | Admit: 2009-02-27 | Discharge: 2009-03-14 | Payer: Self-pay | Admitting: Family Medicine

## 2009-05-16 ENCOUNTER — Ambulatory Visit: Payer: Self-pay | Admitting: Oncology

## 2009-05-20 LAB — COMPREHENSIVE METABOLIC PANEL
AST: 12 U/L (ref 0–37)
Albumin: 4.3 g/dL (ref 3.5–5.2)
Alkaline Phosphatase: 99 U/L (ref 39–117)
BUN: 23 mg/dL (ref 6–23)
Calcium: 9.9 mg/dL (ref 8.4–10.5)
Chloride: 104 mEq/L (ref 96–112)
Glucose, Bld: 102 mg/dL — ABNORMAL HIGH (ref 70–99)
Potassium: 3.6 mEq/L (ref 3.5–5.3)
Sodium: 142 mEq/L (ref 135–145)
Total Protein: 6.8 g/dL (ref 6.0–8.3)

## 2009-05-20 LAB — CBC WITH DIFFERENTIAL/PLATELET
Basophils Absolute: 0 10*3/uL (ref 0.0–0.1)
EOS%: 2 % (ref 0.0–7.0)
Eosinophils Absolute: 0.1 10*3/uL (ref 0.0–0.5)
HGB: 12.5 g/dL (ref 11.6–15.9)
MONO%: 6.7 % (ref 0.0–14.0)
NEUT#: 4 10*3/uL (ref 1.5–6.5)
RBC: 4 10*6/uL (ref 3.70–5.45)
RDW: 13.3 % (ref 11.2–14.5)
lymph#: 2.6 10*3/uL (ref 0.9–3.3)

## 2009-05-29 ENCOUNTER — Encounter (INDEPENDENT_AMBULATORY_CARE_PROVIDER_SITE_OTHER): Payer: Self-pay | Admitting: *Deleted

## 2009-06-12 ENCOUNTER — Encounter: Admission: RE | Admit: 2009-06-12 | Discharge: 2009-06-12 | Payer: Self-pay | Admitting: Internal Medicine

## 2009-06-13 ENCOUNTER — Inpatient Hospital Stay (HOSPITAL_COMMUNITY): Admission: EM | Admit: 2009-06-13 | Discharge: 2009-06-14 | Payer: Self-pay | Admitting: Emergency Medicine

## 2009-06-26 ENCOUNTER — Encounter: Payer: Self-pay | Admitting: Cardiology

## 2009-07-25 ENCOUNTER — Ambulatory Visit: Payer: Self-pay | Admitting: Cardiology

## 2009-07-25 DIAGNOSIS — R0602 Shortness of breath: Secondary | ICD-10-CM | POA: Insufficient documentation

## 2009-09-05 ENCOUNTER — Ambulatory Visit: Payer: Self-pay | Admitting: Oncology

## 2009-09-09 LAB — COMPREHENSIVE METABOLIC PANEL
Albumin: 4.5 g/dL (ref 3.5–5.2)
BUN: 22 mg/dL (ref 6–23)
CO2: 25 mEq/L (ref 19–32)
Calcium: 9.5 mg/dL (ref 8.4–10.5)
Chloride: 105 mEq/L (ref 96–112)
Creatinine, Ser: 0.81 mg/dL (ref 0.40–1.20)
Glucose, Bld: 96 mg/dL (ref 70–99)
Potassium: 3.9 mEq/L (ref 3.5–5.3)

## 2009-09-09 LAB — CBC WITH DIFFERENTIAL/PLATELET
Basophils Absolute: 0.1 10*3/uL (ref 0.0–0.1)
Eosinophils Absolute: 0.1 10*3/uL (ref 0.0–0.5)
HCT: 39.3 % (ref 34.8–46.6)
HGB: 13.4 g/dL (ref 11.6–15.9)
MCH: 32 pg (ref 25.1–34.0)
MONO#: 0.5 10*3/uL (ref 0.1–0.9)
NEUT#: 3.4 10*3/uL (ref 1.5–6.5)
NEUT%: 53.2 % (ref 38.4–76.8)
lymph#: 2.3 10*3/uL (ref 0.9–3.3)

## 2009-09-09 LAB — LACTATE DEHYDROGENASE: LDH: 157 U/L (ref 94–250)

## 2010-01-15 ENCOUNTER — Ambulatory Visit: Payer: Self-pay | Admitting: Oncology

## 2010-01-17 LAB — CBC WITH DIFFERENTIAL/PLATELET
Basophils Absolute: 0 10*3/uL (ref 0.0–0.1)
Eosinophils Absolute: 0.1 10*3/uL (ref 0.0–0.5)
HGB: 12.9 g/dL (ref 11.6–15.9)
MCV: 92.1 fL (ref 79.5–101.0)
MONO#: 0.5 10*3/uL (ref 0.1–0.9)
MONO%: 6.1 % (ref 0.0–14.0)
NEUT#: 3.9 10*3/uL (ref 1.5–6.5)
RBC: 4.16 10*6/uL (ref 3.70–5.45)
RDW: 12.9 % (ref 11.2–14.5)
WBC: 7.6 10*3/uL (ref 3.9–10.3)
lymph#: 3.1 10*3/uL (ref 0.9–3.3)

## 2010-01-17 LAB — COMPREHENSIVE METABOLIC PANEL
Albumin: 4.5 g/dL (ref 3.5–5.2)
Alkaline Phosphatase: 93 U/L (ref 39–117)
Calcium: 9.5 mg/dL (ref 8.4–10.5)
Chloride: 107 mEq/L (ref 96–112)
Glucose, Bld: 110 mg/dL — ABNORMAL HIGH (ref 70–99)
Potassium: 3.7 mEq/L (ref 3.5–5.3)
Sodium: 143 mEq/L (ref 135–145)
Total Protein: 6.7 g/dL (ref 6.0–8.3)

## 2010-02-18 ENCOUNTER — Encounter: Admission: RE | Admit: 2010-02-18 | Discharge: 2010-02-18 | Payer: Self-pay | Admitting: Oncology

## 2010-05-19 ENCOUNTER — Ambulatory Visit: Payer: Self-pay | Admitting: Oncology

## 2010-05-27 ENCOUNTER — Other Ambulatory Visit: Payer: Self-pay | Admitting: Oncology

## 2010-05-27 LAB — COMPREHENSIVE METABOLIC PANEL
AST: 21 U/L (ref 0–37)
BUN: 18 mg/dL (ref 6–23)
Calcium: 9.4 mg/dL (ref 8.4–10.5)
Creatinine, Ser: 0.67 mg/dL (ref 0.40–1.20)
Potassium: 3.7 mEq/L (ref 3.5–5.3)
Sodium: 140 mEq/L (ref 135–145)
Total Bilirubin: 0.3 mg/dL (ref 0.3–1.2)
Total Protein: 6.7 g/dL (ref 6.0–8.3)

## 2010-05-27 LAB — LACTATE DEHYDROGENASE: LDH: 156 U/L (ref 94–250)

## 2010-05-27 LAB — CBC WITH DIFFERENTIAL/PLATELET
Basophils Absolute: 0 10*3/uL (ref 0.0–0.1)
EOS%: 1.6 % (ref 0.0–7.0)
MCV: 93.7 fL (ref 79.5–101.0)
NEUT#: 3.4 10*3/uL (ref 1.5–6.5)
NEUT%: 52.7 % (ref 38.4–76.8)
lymph#: 2.5 10*3/uL (ref 0.9–3.3)

## 2010-05-27 LAB — CANCER ANTIGEN 27.29: CA 27.29: 21 U/mL (ref 0–39)

## 2010-06-05 ENCOUNTER — Ambulatory Visit (HOSPITAL_COMMUNITY): Admission: RE | Admit: 2010-06-05 | Discharge: 2010-06-05 | Payer: Self-pay | Admitting: Oncology

## 2010-09-11 ENCOUNTER — Ambulatory Visit: Payer: Self-pay | Admitting: Oncology

## 2010-09-15 LAB — CBC WITH DIFFERENTIAL/PLATELET
BASO%: 0.5 % (ref 0.0–2.0)
LYMPH%: 41 % (ref 14.0–49.7)
MCHC: 34.7 g/dL (ref 31.5–36.0)
MCV: 93.8 fL (ref 79.5–101.0)
MONO%: 6.8 % (ref 0.0–14.0)
Platelets: 261 10*3/uL (ref 145–400)
RBC: 4.03 10*6/uL (ref 3.70–5.45)
RDW: 13.3 % (ref 11.2–14.5)
WBC: 5.9 10*3/uL (ref 3.9–10.3)

## 2010-09-15 LAB — COMPREHENSIVE METABOLIC PANEL
ALT: 17 U/L (ref 0–35)
AST: 15 U/L (ref 0–37)
Alkaline Phosphatase: 81 U/L (ref 39–117)
Sodium: 141 mEq/L (ref 135–145)
Total Bilirubin: 0.3 mg/dL (ref 0.3–1.2)
Total Protein: 6.7 g/dL (ref 6.0–8.3)

## 2011-01-09 LAB — GLUCOSE, CAPILLARY
Glucose-Capillary: 115 mg/dL — ABNORMAL HIGH (ref 70–99)
Glucose-Capillary: 136 mg/dL — ABNORMAL HIGH (ref 70–99)
Glucose-Capillary: 93 mg/dL (ref 70–99)
Glucose-Capillary: 98 mg/dL (ref 70–99)

## 2011-01-09 LAB — DIFFERENTIAL
Basophils Absolute: 0 10*3/uL (ref 0.0–0.1)
Lymphocytes Relative: 18 % (ref 12–46)
Monocytes Absolute: 0.8 10*3/uL (ref 0.1–1.0)
Neutro Abs: 9.6 10*3/uL — ABNORMAL HIGH (ref 1.7–7.7)
Neutrophils Relative %: 75 % (ref 43–77)

## 2011-01-09 LAB — PROTIME-INR
INR: 1 (ref 0.00–1.49)
INR: 1.1 (ref 0.00–1.49)

## 2011-01-09 LAB — COMPREHENSIVE METABOLIC PANEL
ALT: 20 U/L (ref 0–35)
AST: 16 U/L (ref 0–37)
Albumin: 4.3 g/dL (ref 3.5–5.2)
Alkaline Phosphatase: 101 U/L (ref 39–117)
BUN: 14 mg/dL (ref 6–23)
CO2: 28 mEq/L (ref 19–32)
Calcium: 9.6 mg/dL (ref 8.4–10.5)
Chloride: 101 mEq/L (ref 96–112)
Creatinine, Ser: 0.74 mg/dL (ref 0.4–1.2)
GFR calc Af Amer: >60 mL/min (ref >60)
GFR calc non Af Amer: >60 mL/min (ref >60)
Glucose, Bld: 102 mg/dL — ABNORMAL HIGH (ref 70–99)
Potassium: 3.3 mEq/L — ABNORMAL LOW (ref 3.5–5.1)
Sodium: 139 mEq/L (ref 135–145)
Total Bilirubin: 0.9 mg/dL (ref 0.3–1.2)
Total Protein: 7.7 g/dL (ref 6.0–8.3)

## 2011-01-09 LAB — CBC
HCT: 36.6 % (ref 36.0–46.0)
Hemoglobin: 12.3 g/dL (ref 12.0–15.0)
Hemoglobin: 14 g/dL (ref 12.0–15.0)
RBC: 3.9 MIL/uL (ref 3.87–5.11)
RBC: 4.41 MIL/uL (ref 3.87–5.11)
RDW: 13.2 % (ref 11.5–15.5)
RDW: 13.5 % (ref 11.5–15.5)
WBC: 10.1 10*3/uL (ref 4.0–10.5)
WBC: 12.8 10*3/uL — ABNORMAL HIGH (ref 4.0–10.5)

## 2011-01-09 LAB — URINALYSIS, DIPSTICK ONLY
Bilirubin Urine: NEGATIVE
Glucose, UA: NEGATIVE mg/dL
Hgb urine dipstick: NEGATIVE
Ketones, ur: NEGATIVE mg/dL
Nitrite: NEGATIVE
Protein, ur: NEGATIVE mg/dL
Specific Gravity, Urine: 1.018 (ref 1.005–1.030)
Urobilinogen, UA: 0.2 mg/dL (ref 0.0–1.0)
pH: 6.5 (ref 5.0–8.0)

## 2011-01-09 LAB — BASIC METABOLIC PANEL
BUN: 10 mg/dL (ref 6–23)
CO2: 28 mEq/L (ref 19–32)
Calcium: 8.8 mg/dL (ref 8.4–10.5)
Calcium: 9.2 mg/dL (ref 8.4–10.5)
GFR calc Af Amer: >60 mL/min (ref >60)
GFR calc non Af Amer: >60 mL/min (ref >60)
GFR calc non Af Amer: >60 mL/min (ref >60)
Glucose, Bld: 124 mg/dL — ABNORMAL HIGH (ref 70–99)
Potassium: 3.3 mEq/L — ABNORMAL LOW (ref 3.5–5.1)
Sodium: 138 mEq/L (ref 135–145)
Sodium: 143 mEq/L (ref 135–145)

## 2011-01-09 LAB — ABO/RH: ABO/RH(D): A POS

## 2011-01-09 LAB — TYPE AND SCREEN
ABO/RH(D): A POS
Antibody Screen: NEGATIVE

## 2011-01-09 LAB — CULTURE, BLOOD (ROUTINE X 2)
Culture: NO GROWTH
Culture: NO GROWTH

## 2011-01-09 LAB — LACTIC ACID, PLASMA: Lactic Acid, Venous: 0.9 mmol/L (ref 0.5–2.2)

## 2011-01-09 LAB — APTT: aPTT: 21 seconds — ABNORMAL LOW (ref 24–37)

## 2011-02-17 NOTE — Assessment & Plan Note (Signed)
[facility]                            CARDIOLOGY OFFICE NOTE   HALIEGH, KHURANA                       MRN:          [id number]  DATE:06/12/2008                            DOB:          09-Apr-1946    PRIMARY CARE PHYSICIAN:  Dr. Charlesetta Shanks.   HISTORY OF PRESENT ILLNESS:  This is a 65 year old with a history of  asthma and hypertension who was seen in our office for workup of  exertional dyspnea.  Since initial visit, the patient has had a  echocardiogram, which was unremarkable, and an exercise Myoview which  did not show any indication for ischemia.  Of note, the patient had very  poor exercise capacity on the exercise Myoview.  However, she does state  that she was not feeling really tired and they told her that she could  stop.  Her symptoms are basically unchanged.  She still gets shortness  of breath after walking up an incline, climbing a flight of stairs fast,  or doing any especially strenuous activity.  She does not get short of  breath walking on flat ground or doing housework like vacuuming.  Overall, she is not particularly active due to the osteoarthritis in her  left knee.  These episodes are not associated with wheezing.  However,  she states that she has had a long history of asthma, and her symptoms  have not been very often associated with wheezing.  She has taken  allergy shots for asthma in the past.  Also of note, she has now been  using her Advair very often; in fact, in the last week she has not been  using it at all.  She is not complaining of any chest tightness or chest  pain.   PAST MEDICATIONS:  1. Zafirlukast 20 mg b.i.d.  2. Nexium 40 mg daily.  3. Potassium 20 mEq daily.  4. Advair 250/50 b.i.d.; however, the patient is not using it very      often.  5. Aspirin 81 mg daily.  6. Lipitor 20 mg daily.  7. Hydrochlorothiazide 25 mg daily.  8. Allegra.  9. Aromasin.   PAST MEDICAL HISTORY:  1.  Hypercholesterolemia.  2. Hypertension.  3. History of pulmonary embolism in 2001.  4. History of breast cancer in her right breast in 1999.  She had a      mastectomy followed by chemotherapy.  She had mastectomy of her      left breast in 2008.  She is currently on therapy with Aromasin.  5. Asthma.  6. Gastroesophageal reflux disease.  7. Osteoarthritis in the left knee.  8. Family history of sudden cardiac death.  The patient does have a      number of people in her family who dropped dead.  This could be due      to coronary artery disease or could be from arrhythmic problem.      The patient does have a completely normal EKG, with no prolonged      QTc and no evidence of Brugada syndrome etc.   SOCIAL HISTORY:  The  patient does not smoke.  She does not drink alcohol  or use illicit drugs.  She lives with her husband.  She is retired.   STUDIES:  1. The patient had an echocardiogram in August 2009.  This is      essentially a normal scan.  EF was 60% .  There are no regional      wall motion abnormalities.  No significant valvular abnormalities.  2. Exercise Myoview in August 2009.  The patient exercised for 3      minutes and 16 seconds reaching 81% of her maximum predicted heart      rate and 4.9 METS, and she stopped secondary to fatigue.  There are      no diagnostic ST-T wave changes.  EF was 60%.  There were no      significant perfusion defects.  Of note, this shows poor exercise      capacity.  However, the patient actually says that they told her to      stop because she had done enough.  She said she was not really      tired, so I am not sure what the situation exactly was.   PHYSICAL EXAMINATION:  VITALS:  Weight is 184 pounds, blood pressure  138/79, heart rate 87 and regular.  GENERAL:  In no apparent distress.  This is a mildly obese female.  NEUROLOGICAL:  Alert and oriented x3.  There is normal affect.  NECK:  No JVD.  No thyromegaly or nodule.  HEART:   Regular, S1 and S2.  No S3, no S4, no murmur.  There is no  edema.  LUNGS:  Clear to auscultation bilaterally with normal respiratory  excursion.  ABDOMEN:  Soft, nontender.  No hepatosplenomegaly.  EXTREMITIES:  No clubbing or cyanosis.   ASSESSMENT AND PLAN:  This is a 65 year old with a history of exertional  dyspnea, hypertension, hypercholesterolemia, and asthma who presents to  Cardiology Clinic for followup.  1. Dyspnea on exertion.  The patient did have an unremarkable      echocardiogram and exercise Myoview.  The exercise Myoview was      notable for poor exercise capacity, and she did not quite reach 85%      of MPHR.  However, there is no evidence of ischemia.  The patient      may be having episodes of exercise-induced asthma.  However, she      says she does not wheeze with these episodes though I do think they      could possibly represent exercise-induced asthma.  She has not been      using her Advair very regularly, so I told her to go ahead and      start taking her Advair 250/50 mg inhaled twice a day and to use      this every day and see if this helps with the symptoms.  She does      have a number of risk factors for coronary artery disease, so it      would be very important for her to continue on her aspirin, to keep      her blood pressure controlled, and to keep her LDL at least below      100.  2. Hypertension.  The patient's blood pressure today is 138/79 on the      increased dose of hydrochlorothiazide.  This is reasonable.  We      will continue on her current medications.  3.  Hypercholesterolemia.  We increased the patient's Lipitor to 20 mg      daily.  We will go ahead and get a lipid profile in 2 months from      now.  4. The patient can follow up in the office here to see how she is      doing in about a year unless she has problems in the meantime.     Marca Ancona, MD  Electronically Signed    DM/MedQ  DD: 06/12/2008  DT: 06/13/2008  Job  #: [id number]   cc:   Doylene Canard Eden Emms, MD, [facility]

## 2011-02-17 NOTE — Assessment & Plan Note (Signed)
[name]                            CARDIOLOGY OFFICE NOTE   [name], [name]                       MRN:          [id number]  DATE:05/07/2008                            DOB:          Jan 22, 1946    PRIMARY CARE PHYSICIAN:  Dr. Charlesetta Shanks.   HISTORY OF PRESENT ILLNESS:  This is a 65 year old with a history of  hypercholesterolemia, hypertension, and breast cancer who presents to  Cardiology Clinic for evaluation of shortness of breath.  The patient  states that this summer she has begun to notice shortness of breath  especially when she is walking up a mild incline in her yard.  She also  notices shortness of breath with especially strenuous activity.  She  does not get short of breath walking on flat ground.  She does not get  short of breath doing housework like vacuuming.  However, overall, she  is not very active because of osteoarthritis in her left knee.  She  really just started noticing the symptoms this summer.  She also has a  history of asthma.  However, these episodes of shortness of breath are  not associated with wheezing.  Her last significant asthma attack she  says was about 2 to 3 month ago with trigger back, cold air, indigent  fall, and significant wheezing.  She denies chest pain, palpitations, or  syncope.   MEDICATIONS:  1. Zafirlukast 20 mg b.i.d.  2. Nexium 40 mg daily.  3. Potassium.  4. Advair 250/50 b.i.d.  5. Aspirin 81 mg daily.  6. Lipitor 10 mg daily.  7. Hydrochlorothiazide 12.5 mg daily.  8. Allegra.  9. Aromasin.   PAST MEDICAL HISTORY:  1. Hypercholesterolemia.  2. Hypertension.  3. History of pulmonary embolism in 2001.  4. History of breast cancer in her right breast in 1999.  She had      mastectomy followed by chemotherapy.  She had mastectomy of her      left breast in 2008.  She currently is on therapy with Aromasin.  5. Asthma.  6. Gastroesophageal reflux disease.  7. Osteoarthritis in the  left knee.  8. Family history of apparent sudden cardiac death.  The patient does      have a number of patients in her family who have drop dead.  This      could be due to coronary disease or it could be a familial      arrhythmic problem.  The patient does have a completely normal EKG      today.  No prolonged QTc.  No evidence of Brugada syndrome, etc.   SOCIAL HISTORY:  The patient denies smoking.  She does not drink,  alcohol use, or illicit drugs.  She lives with her husband.  She is  retired.   FAMILY HISTORY:  The patient had an uncle with sudden cardiac death in  his 80s and aunt with sudden cardiac death and a grandmother with  apparent sudden cardiac death.  Her parents died in a car accident when  she was a child.  Her brother also apparently  had sudden cardiac death.   REVIEW OF SYSTEMS:  Negative except as noted in the history of present  illness.  She is not currently wheezing or coughing.  She has no history  of stroke-like symptoms.   IMAGING STUDIES:  The patient had an EKG today that showed normal sinus  rhythm and it was an overall normal EKG. and the patient states that she  had a MUGA scan about a year ago at [facility] with an EF of 60% as she  remembers it.  Most recent labs April 2009 with an LDL of 106, HDL of  38, and creatinine of 0.7.   PHYSICAL EXAMINATION:  VITAL SIGNS:  Blood pressure 140/68, heart rate  87 and regular, and weight 184 pounds.  GENERAL:  No apparent distress.  Mildly obese female.  NEUROLOGIC:  Alert and oriented x3.  There is normal affect.  NECK:  No JVD.  LUNGS:  Clear to auscultation bilaterally.  No wheezes.  HEART:  Regular S1 and S2.  No S3 and no S4.  No murmur. No carotid  bruits.  No pedal edema. 2+ dorsalis pedis pulses bilaterally.  ABDOMEN:  Soft.  Obese, nontender, and no hepatosplenomegaly.  EXTREMITIES:  No clubbing or cyanosis.  MSK: No abnormalities.  SKIN: No abnormalities.  HEENT: No abnormalities.    ASSESSMENT AND PLAN:  This is a 65 year old with a history of  hypertension, hypercholesterolemia, and breast cancer, who presents for  evaluation of shortness of breath on exertion.  1. Dyspnea on exertion.  The patient does have episodes of dyspnea on      exertion.  She has no history of dyspnea at rest.  These episodes      could possibly represent exercise-induced asthma.  However, she      denies wheezing with these episodes, and she also does have some      risk factors for heart disease including hypercholesterolemia,      hypertension, and a strong family history of apparent sudden      cardiac death.  I think at this point probably the best course of      action would be to work her up for cardiac causes of dyspnea on      exertion with an echocardiogram and an exercise Myoview.  She will      continue on her aspirin 81 mg a day.  2. Hypertension.  The patient's blood pressure today is 140/68 which      is on the high side.  We will go ahead and increase her      hydrochlorothiazide to 25 mg daily.  3. Hypercholesterolemia.  The patient's last lipid showed an LDL of      106.  She is on Lipitor 10      mg daily.  I will go ahead and increase that to 20 mg daily.  It      would be nice to see her with an LDL less than 100.     Victoria Ancona, MD  Electronically Signed    DM/MedQ  DD: 05/07/2008  DT: 05/07/2008  Job #: [id number]   cc:   Quentin Angst, MD, [facility]

## 2011-02-20 NOTE — Op Note (Signed)
   NAME:  [name], [name]                          ACCOUNT NO.:  [id number]   MEDICAL RECORD NO.:  [id number]                   PATIENT TYPE:  AMB   LOCATION:  ENDO                                 FACILITY:  [facility]   PHYSICIAN:  James L. Malon Kindle., M.D.          DATE OF BIRTH:  [birth year]   DATE OF PROCEDURE:  10/17/2002  DATE OF DISCHARGE:                                 OPERATIVE REPORT   PROCEDURE PERFORMED:  Esophagogastroduodenoscopy.   MEDICATIONS:  Cetacaine spray, fentanyl 50 mcg, Versed 5 mg IV.   ENDOSCOPIST:  Llana Aliment. Randa Evens, M.D.   INDICATIONS FOR PROCEDURE:  New onset of epigastric pain that seems somewhat  atypical.   DESCRIPTION OF PROCEDURE:  The procedure had been explained to the patient  and consent obtained.  With the patient in left lateral decubitus position,  the Olympus video endoscope was inserted blindly and advanced under direct  visualization.  The stomach was entered.  The pylorus identified and passed.  The duodenum including the bulb and second portion were seen well and were  unremarkable.  The pyloric channel was normal.  The antrum and body were  normal.  Fundus and cardia were seen on the retroflex view and were normal.  The distal esophagus was reddened but there were no ulcerations.  There was  no significant hiatal hernia.  The scope was withdrawn.  The proximal  esophagus was normal.  The vocal cords were seen briefly and passed and  appeared grossly normal.  The patient tolerated the procedure well and was  maintained on low flow oxygen and pulse oximeter throughout the procedure.   ASSESSMENT:  Gastroesophageal reflux disease.    PLAN:  Will continue the patient on Aciphex.  Antireflux instructions and  see back in the office in six to eight weeks.                                               James L. Malon Kindle., M.D.    Waldron Session  D:  10/17/2002  T:  10/17/2002  Job:  [id number]   cc:   Thelma Barge P. Modesto Charon, M.D.  [address]  [place]  [place] [address]  Fax: [phone]   Genene Churn. Cyndie Chime, M.D.  [address] [facility]  [place]  [place] [address]  Fax: [phone]

## 2011-02-20 NOTE — Assessment & Plan Note (Signed)
[facility]                               PULMONARY OFFICE NOTE   Victoria, Terry                       MRN:          [id number]  DATE:04/27/2006                            DOB:          1946-01-19    PROBLEM LIST:  1.  Asthma.  2.  Allergic rhinitis.  3.  Last mastectomy in 1999.  4.  Esophageal reflux.  5.  Deep vein thrombosis with embolism in 2001.   HISTORY:  Last here in January 2006, at which time she was complaining of  increased exertional dyspnea.  That resolved, and she did not keep followup  here.  She notices that occasional exposure to moldy areas, house dust, or  her fairly active yard work with dust and pollen exposures are common  triggers for her.  After a particularly heavy episode of yard work, she  recently restarted her Advair at 250/50, but usually had not been using  anything.  She has taken the carpet out of her bedroom.  She freezes the  bedsheets because her water heater will not get hot enough in the washer to  kill mites.  Little seasonal change.  Allegra has helped blunt this.  She  continues allergy vaccine here at 1:10 with no problems.  We talked about  vaccine goals and duration.  She has been on allergy shots now 5 years.  We  have suggested it is time to stop and see how she does without.  Options  were reviewed.   MEDICATIONS:  1.  Advair 250/50.  2.  Accolate 20 mg b.i.d.  3.  Lipitor.  4.  Allegra 180 mg.  5.  Nexium.  6.  Actonel.   Drug intolerance to HYDROCODONE.   OBJECTIVE:  VITAL SIGNS:  Weight 188 pounds, BP 140/80, pulse regular at 58,  room air saturation 98%.  HEENT:  Eyes, nose, and throat are clear.  LUNGS:  Clear to P&A.  HEART:  Heart sounds regular, without murmur.   IMPRESSION:  1.  Stable asthma.  2.  Other problems as noted above.   PLAN:  1.  Stop allergy vaccine when current supplies are finished, with option to      retest or restart p.r.n.  2.  Refill Accolate 20 mg  b.i.d.  3.  Schedule return in one year, earlier p.r.n.                                   Clinton D. Maple Hudson, MD, FCCP, FACP   CDY/MedQ  DD:  04/27/2006  DT:  04/28/2006  Job #:  [id number]   cc:   Genene Churn. Cyndie Chime, MD

## 2011-02-20 NOTE — Assessment & Plan Note (Signed)
[facility]                         GASTROENTEROLOGY OFFICE NOTE   EMERIE, VANDERKOLK                       MRN:          [id number]  DATE:11/12/2006                            DOB:          [birth year]    REFERRING PHYSICIAN:  Charlesetta Shanks   OFFICE CONSULTATION:   REASON FOR REFERRAL:  Nausea and epigastric pain.   HISTORY OF PRESENT ILLNESS:  [name] is a 65 year old white female,  referred through the courtesy of Dr. Celene Skeen.  She relates problems with  GERD in the past.  She underwent upper endoscopy in January of 2004 by  Dr. Randa Evens, which was normal.  She was treated with AcipHex and  antireflux measures at the time.  She previously underwent colonoscopy  by Dr. Randa Evens for a family history of colon cancer in November of 2003,  which was normal, as well.  For about the past two to three months, she  has had an annoying low-grade nausea that is present most days of the  week.  Some days it is not there.  Other days she will wake up with it  and it will last all day.  It is not changed by position, meals,  movement or any particular activity.  She had an episode of vomiting  this past weekend, but that was unusual for her.  She has intermittent  burning epigastric pain.  She is currently maintained on Nexium and she  states she was advised to use it twice a day, but generally uses it only  once a day.  She has no dysphagia, odynophagia, change in bowel habits,  melena, hematochezia, weight-loss, change in appetite, headaches or  focal weakness.   FAMILY HISTORY:  Remarkable for an aunt with colon cancer.  The patient  has a personal history of breast cancer.   PAST MEDICAL HISTORY:  Asthma, hyperlipidemia, arthritis, breast cancer  in 1999-status post mastectomy and chemotherapy; allergies, GERD.   MEDICATIONS:  Listed on the chart, updated and reviewed.   MEDICATION ALLERGIES:  Intolerance to HYDROCODONE, leading to nausea.   SOCIAL HISTORY:  Per the handwritten form.   REVIEW OF SYSTEMS:  Per the handwritten form.   PHYSICAL EXAM:  No acute distress, height 5 feet 2 inches, weight 184.8  pounds.  Blood pressure is 120/68, pulse 84 and regular.  HEENT EXAM:  Anicteric sclerae, oropharynx clear.  CHEST:  Clear to auscultation bilaterally.  CARDIAC:  Regular rate and rhythm without murmurs appreciated.  ABDOMEN:  Soft, nontender, nondistended.  Normoactive bowel sounds.  No  palpable organomegaly, masses or hernias.  EXTREMITIES:  Without clubbing, cyanosis or edema.  NEUROLOGIC:  Alert and oriented times three.  Grossly nonfocal.   ASSESSMENT AND PLAN:  Unexplained nausea with mild epigastric pain.  Known GERD.  Rule out GERD, gastritis, duodenitis, ulcer disease and  gastroparesis.  Her nausea may be unrelated to any gastrointestinal  disorder and this may require further evaluation by Dr. Celene Skeen.  Risks,  benefits, and alternatives to upper endoscopy with possible biopsy  discussed with the patient.  She consents to proceed.  This will be  scheduled electively.  Recent abdominal ultrasound imaging from October 28, 2006, at [facility] showed a mild increase in  echogenicity of the liver.  No other abnormalities were noted.  Continue  standard antireflux measures and Nexium 40 mg daily for now.  If the  upper endoscopy is unremarkable, we will consider a trial of Nexium  b.i.d. for four to six weeks.     Venita Lick. Russella Dar, MD, [facility]  Electronically Signed    MTS/MedQ  DD: 11/12/2006  DT: 11/12/2006  Job #: [id number]   cc:   Charlesetta Shanks

## 2011-02-20 NOTE — Op Note (Signed)
   NAME:  [name], [name]                          ACCOUNT NO.:  [id number]   MEDICAL RECORD NO.:  [id number]                   PATIENT TYPE:  AMB   LOCATION:  ENDO                                 FACILITY:  [facility]   PHYSICIAN:  James L. Malon Kindle., M.D.          DATE OF BIRTH:  Feb 14, 1946   DATE OF PROCEDURE:  08/24/2002  DATE OF DISCHARGE:                                 OPERATIVE REPORT   PROCEDURE:  Colonoscopy.   MEDICATIONS:  Fentanyl 75 mcg, Versed 7.5 mg IV.   INDICATIONS FOR PROCEDURE:  Strong family history of colon cancer.   SCOPE:  Olympus pediatric scope.   DESCRIPTION OF PROCEDURE:  The procedure had been explained to the patient  and consent obtained. With the patient in the left lateral decubitus  position, the Olympus pediatric video colonoscope was inserted and advanced  under direct visualization. The prep was excellent. We were able to reach  the cecum without difficulty. The ileocecal valve was seen. The scope was  withdrawn and the cecum, ascending colon, hepatic flexure, transverse colon,  splenic flexure, descending and sigmoid colon were seen well upon removal.  No polyps or other lesions were seen. There were no significant diverticular  disease. The scope was withdrawn. The patient tolerated the procedure well  and was maintained on low flow oxygen and pulse oximeter throughout the  procedure.   ASSESSMENT:  Essentially normal colonoscopy in a woman with a strong family  history of colon cancer.   PLAN:  Will recommend repeating in five years. Will place her on a list and  contact her then.                                               James L. Malon Kindle., M.D.    Waldron Session  D:  08/24/2002  T:  08/24/2002  Job:  [id number]   cc:   Thelma Barge P. Modesto Charon, M.D.   Genene Churn. Cyndie Chime, M.D.  [address] [facility]  [place]  [place] [address]  Fax: [phone]

## 2011-02-20 NOTE — Op Note (Signed)
[name], [name]                ACCOUNT NO.:  [id number]   MEDICAL RECORD NO.:  [id number]          PATIENT TYPE:  OUT   LOCATION:  [facility]                          FACILITY:  [facility]   PHYSICIAN:  Leonie Man, M.D.   DATE OF BIRTH:  May 09, 1946   DATE OF PROCEDURE:  02/04/2007  DATE OF DISCHARGE:                               OPERATIVE REPORT   PREOPERATIVE DIAGNOSIS:  Carcinoma right breast.   POSTOPERATIVE DIAGNOSIS:  Carcinoma right breast.   PROCEDURES:  Right total mastectomy with sentinel lymph node biopsy.   SURGEON:  Leonie Man, M.D.   ASSISTANT:  OR nurse.   ANESTHESIA:  General.   SPECIMENS TO LAB:  Right breast and sentinel lymph nodes.   ESTIMATED BLOOD LOSS:  100 mL.   COMPLICATIONS:  None.  The patient to the PACU in excellent condition.   INDICATIONS FOR PROCEDURE:  This is a 65 year old female who is status  post left-sided mastectomy in the past for a carcinoma of the left  breast.  She presented recently with a right-sided breast mass which on  evaluation on mammogram is suspicious and core biopsy shows invasive  carcinoma.  The tumor is estrogen and progesterone receptor positive  with a low proliferation ratio and her2 negative.  The patient's  original ultrasound and mammogram showed a lesion to be approximately  1.2 cm however on MRI enhancement this showed to be an area  approximately 3 cm in total diameter.  After discussions with respect to  alternatives for surgical therapy.  The patient decided that she wished  to go forward with a mastectomy and she comes to the operating room  today after risks and potential benefits of same have been fully  discussed, all questions answered and consent obtained.   Following induction of satisfactory general anesthesia I injected 4 mL  of Lymphazurin dye into the right breast and massaged this into the  lymphatics.  The chest was then prepped and draped to be included in the  sterile operative field.  An  elliptical incision is carried down around  the breast inclusive of the nipple, raising a superior medial flap to  the clavicle and to the sternal border and inferior lateral flap to the  rectus muscle and to the anterior border of the latissimus dorsi.  Having raised the flaps the gamma probe was used to locate the sentinel  node in the right axilla.  This was dissected free and forwarded for  pathologic evaluation.  A total of two sentinel lymph nodes were sent  down both of which on touch prep showed to be negative.  They were  significantly in the lower axillary content.  There were significantly  more nodes in a fat pad here and these were dissected free and sent with  a permanent specimen.  The breast tissues were then dissected free from  the chest wall taking the anterior pectoralis fascia and carrying the  dissection laterally over the pectoralis major and minor muscles.  We  did take the intercostals humeral nerve and lateral aspect of dissection  of long thoracic and thoracodorsal  nerves were identified and spared.  The remaining attachments to the serratus anterior were then taken down.  The breast was removed and forwarded for pathologic evaluation.  The  wound was then thoroughly irrigated with normal saline.  Additional  bleeding points treated electrocautery.  Sponge and instrument counts  were verified.  Two 19-French Blake drains were placed over the flaps  for drainage and the wound was closed in layers as follows.  Subcutaneous tissues closed with interrupted 2-0 Vicryl.  Skin closed  with stainless steel staples.  A sterile compressive dressing was placed  on the incision.  Anesthetic reversed and the patient removed from the  operating room to the recovery room in stable condition.  She tolerated  the procedure well.      Leonie Man, M.D.  Electronically Signed     PB/MEDQ  D:  02/04/2007  T:  02/04/2007  Job:  [id number]

## 2011-03-16 ENCOUNTER — Encounter (HOSPITAL_BASED_OUTPATIENT_CLINIC_OR_DEPARTMENT_OTHER): Payer: Medicare Other | Admitting: Oncology

## 2011-03-16 ENCOUNTER — Other Ambulatory Visit: Payer: Self-pay | Admitting: Oncology

## 2011-03-16 DIAGNOSIS — C50419 Malignant neoplasm of upper-outer quadrant of unspecified female breast: Secondary | ICD-10-CM

## 2011-03-16 DIAGNOSIS — E119 Type 2 diabetes mellitus without complications: Secondary | ICD-10-CM

## 2011-03-16 LAB — COMPREHENSIVE METABOLIC PANEL
Albumin: 4.3 g/dL (ref 3.5–5.2)
BUN: 19 mg/dL (ref 6–23)
CO2: 24 mEq/L (ref 19–32)
Calcium: 9.2 mg/dL (ref 8.4–10.5)
Chloride: 106 mEq/L (ref 96–112)
Creatinine, Ser: 0.85 mg/dL (ref 0.50–1.10)
Glucose, Bld: 120 mg/dL — ABNORMAL HIGH (ref 70–99)
Potassium: 3.8 mEq/L (ref 3.5–5.3)

## 2011-03-16 LAB — CBC WITH DIFFERENTIAL/PLATELET
Basophils Absolute: 0 10*3/uL (ref 0.0–0.1)
Eosinophils Absolute: 0.2 10*3/uL (ref 0.0–0.5)
HCT: 36.8 % (ref 34.8–46.6)
HGB: 12.5 g/dL (ref 11.6–15.9)
NEUT#: 3.3 10*3/uL (ref 1.5–6.5)
NEUT%: 47.8 % (ref 38.4–76.8)
RDW: 12.8 % (ref 11.2–14.5)
lymph#: 3 10*3/uL (ref 0.9–3.3)

## 2011-03-16 LAB — LACTATE DEHYDROGENASE: LDH: 155 U/L (ref 94–250)

## 2011-03-23 ENCOUNTER — Encounter (HOSPITAL_BASED_OUTPATIENT_CLINIC_OR_DEPARTMENT_OTHER): Payer: Medicare Other | Admitting: Oncology

## 2011-03-23 DIAGNOSIS — C50419 Malignant neoplasm of upper-outer quadrant of unspecified female breast: Secondary | ICD-10-CM

## 2011-03-23 DIAGNOSIS — Z170 Estrogen receptor positive status [ER+]: Secondary | ICD-10-CM

## 2011-03-23 DIAGNOSIS — E119 Type 2 diabetes mellitus without complications: Secondary | ICD-10-CM

## 2011-07-10 ENCOUNTER — Other Ambulatory Visit: Payer: Self-pay | Admitting: Oncology

## 2011-07-10 DIAGNOSIS — C50919 Malignant neoplasm of unspecified site of unspecified female breast: Secondary | ICD-10-CM

## 2011-07-15 ENCOUNTER — Other Ambulatory Visit: Payer: Self-pay | Admitting: Oncology

## 2011-07-15 ENCOUNTER — Encounter (HOSPITAL_BASED_OUTPATIENT_CLINIC_OR_DEPARTMENT_OTHER): Payer: Medicare Other | Admitting: Oncology

## 2011-07-15 DIAGNOSIS — Z170 Estrogen receptor positive status [ER+]: Secondary | ICD-10-CM

## 2011-07-15 DIAGNOSIS — C50419 Malignant neoplasm of upper-outer quadrant of unspecified female breast: Secondary | ICD-10-CM

## 2011-07-15 DIAGNOSIS — E119 Type 2 diabetes mellitus without complications: Secondary | ICD-10-CM

## 2011-07-15 LAB — BASIC METABOLIC PANEL
Calcium: 9.8 mg/dL (ref 8.4–10.5)
Glucose, Bld: 126 mg/dL — ABNORMAL HIGH (ref 70–99)
Potassium: 3.4 mEq/L — ABNORMAL LOW (ref 3.5–5.3)
Sodium: 139 mEq/L (ref 135–145)

## 2011-07-16 ENCOUNTER — Other Ambulatory Visit (HOSPITAL_COMMUNITY): Payer: Medicare Other

## 2011-07-16 ENCOUNTER — Ambulatory Visit (HOSPITAL_COMMUNITY)
Admission: RE | Admit: 2011-07-16 | Discharge: 2011-07-16 | Disposition: A | Discharge status: Home / Self Care | Payer: Medicare Other | Source: Ambulatory Visit | Attending: Oncology | Admitting: Oncology

## 2011-07-16 ENCOUNTER — Encounter (HOSPITAL_COMMUNITY): Payer: Self-pay

## 2011-07-16 DIAGNOSIS — R0602 Shortness of breath: Secondary | ICD-10-CM | POA: Insufficient documentation

## 2011-07-16 DIAGNOSIS — M542 Cervicalgia: Secondary | ICD-10-CM | POA: Insufficient documentation

## 2011-07-16 DIAGNOSIS — Z79899 Other long term (current) drug therapy: Secondary | ICD-10-CM | POA: Insufficient documentation

## 2011-07-16 DIAGNOSIS — C50919 Malignant neoplasm of unspecified site of unspecified female breast: Secondary | ICD-10-CM | POA: Insufficient documentation

## 2011-07-16 DIAGNOSIS — Z9010 Acquired absence of unspecified breast and nipple: Secondary | ICD-10-CM | POA: Insufficient documentation

## 2011-07-16 DIAGNOSIS — R1310 Dysphagia, unspecified: Secondary | ICD-10-CM | POA: Insufficient documentation

## 2011-07-16 HISTORY — DX: Malignant (primary) neoplasm, unspecified: C80.1

## 2011-07-16 MED ORDER — IOHEXOL 300 MG/ML  SOLN
100.0000 mL | Freq: Once | INTRAMUSCULAR | Status: AC | PRN
  Administered 2011-07-16: 100 mL via INTRAVENOUS

## 2011-07-22 ENCOUNTER — Other Ambulatory Visit: Payer: Self-pay | Admitting: Oncology

## 2011-07-22 DIAGNOSIS — C770 Secondary and unspecified malignant neoplasm of lymph nodes of head, face and neck: Secondary | ICD-10-CM

## 2011-08-12 ENCOUNTER — Other Ambulatory Visit (HOSPITAL_COMMUNITY): Payer: Self-pay | Admitting: Neurology

## 2011-08-13 ENCOUNTER — Ambulatory Visit (HOSPITAL_COMMUNITY)
Admission: RE | Admit: 2011-08-13 | Discharge: 2011-08-13 | Disposition: A | Discharge status: Home / Self Care | Payer: Medicare Other | Source: Ambulatory Visit | Attending: Oncology | Admitting: Oncology

## 2011-08-13 ENCOUNTER — Encounter (HOSPITAL_COMMUNITY): Payer: Self-pay

## 2011-08-13 DIAGNOSIS — I10 Essential (primary) hypertension: Secondary | ICD-10-CM | POA: Insufficient documentation

## 2011-08-13 DIAGNOSIS — R599 Enlarged lymph nodes, unspecified: Secondary | ICD-10-CM | POA: Insufficient documentation

## 2011-08-13 DIAGNOSIS — Z7982 Long term (current) use of aspirin: Secondary | ICD-10-CM | POA: Insufficient documentation

## 2011-08-13 DIAGNOSIS — E119 Type 2 diabetes mellitus without complications: Secondary | ICD-10-CM | POA: Insufficient documentation

## 2011-08-13 DIAGNOSIS — Z79899 Other long term (current) drug therapy: Secondary | ICD-10-CM | POA: Insufficient documentation

## 2011-08-13 DIAGNOSIS — K449 Diaphragmatic hernia without obstruction or gangrene: Secondary | ICD-10-CM | POA: Insufficient documentation

## 2011-08-13 DIAGNOSIS — K219 Gastro-esophageal reflux disease without esophagitis: Secondary | ICD-10-CM | POA: Insufficient documentation

## 2011-08-13 DIAGNOSIS — Z853 Personal history of malignant neoplasm of breast: Secondary | ICD-10-CM | POA: Insufficient documentation

## 2011-08-13 DIAGNOSIS — M129 Arthropathy, unspecified: Secondary | ICD-10-CM | POA: Insufficient documentation

## 2011-08-13 DIAGNOSIS — C770 Secondary and unspecified malignant neoplasm of lymph nodes of head, face and neck: Secondary | ICD-10-CM

## 2011-08-13 DIAGNOSIS — J45909 Unspecified asthma, uncomplicated: Secondary | ICD-10-CM | POA: Insufficient documentation

## 2011-08-13 DIAGNOSIS — Z9010 Acquired absence of unspecified breast and nipple: Secondary | ICD-10-CM | POA: Insufficient documentation

## 2011-08-13 HISTORY — DX: Unspecified osteoarthritis, unspecified site: M19.90

## 2011-08-13 HISTORY — DX: Cramp and spasm: R25.2

## 2011-08-13 HISTORY — DX: Pure hypercholesterolemia, unspecified: E78.00

## 2011-08-13 HISTORY — DX: Other specified health status: Z78.9

## 2011-08-13 HISTORY — DX: Essential (primary) hypertension: I10

## 2011-08-13 HISTORY — DX: Diaphragmatic hernia without obstruction or gangrene: K44.9

## 2011-08-13 HISTORY — DX: Gastro-esophageal reflux disease without esophagitis: K21.9

## 2011-08-13 LAB — CBC
Platelets: 227 10*3/uL (ref 150–400)
RBC: 3.96 MIL/uL (ref 3.87–5.11)
WBC: 5.8 10*3/uL (ref 4.0–10.5)

## 2011-08-13 LAB — PROTIME-INR: Prothrombin Time: 13.3 seconds (ref 11.6–15.2)

## 2011-08-13 MED ORDER — FENTANYL CITRATE 0.05 MG/ML IJ SOLN
INTRAMUSCULAR | Status: AC | PRN
  Administered 2011-08-13: 100 ug via INTRAVENOUS

## 2011-08-13 MED ORDER — SODIUM CHLORIDE 0.9 % IV SOLN: INTRAVENOUS | Status: DC

## 2011-08-13 MED ORDER — ONDANSETRON HCL 4 MG/2ML IJ SOLN
INTRAMUSCULAR | Status: AC
  Filled 2011-08-13: qty 2

## 2011-08-13 MED ORDER — ONDANSETRON HCL 4 MG/2ML IJ SOLN
4.0000 mg | Freq: Once | INTRAMUSCULAR | Status: AC
  Administered 2011-08-13: 4 mg via INTRAVENOUS
  Filled 2011-08-13: qty 2

## 2011-08-13 MED ORDER — MIDAZOLAM HCL 5 MG/5ML IJ SOLN
INTRAMUSCULAR | Status: AC | PRN
  Administered 2011-08-13: 2 mg via INTRAVENOUS

## 2011-08-13 MED ORDER — ALBUMIN HUMAN 25 % IV SOLN
25.0000 g | Freq: Once | INTRAVENOUS | Status: DC
  Filled 2011-08-13: qty 100

## 2011-08-13 NOTE — ED Notes (Signed)
MD at bedside. 

## 2011-08-13 NOTE — H&P (Signed)
[name] is an 65 y.o. female.   Chief Complaint: enlarged left supraclavicular lymph node HPI: Patient with history of bilateral breast cancer and enlarged left supraclavicular lymph node. She presents today for elective US guided biopsy of the enlarged left supraclavicular lymph node.  Past Medical History  Diagnosis Date  . Cancer     breast ca  . Asthma   . GERD (gastroesophageal reflux disease)   . Diabetes mellitus   . Arthritis   . Allergy history unknown   . Hiatal hernia   . Leg cramps   . Hypertension   . Elevated cholesterol     Past Surgical History  Procedure Date  . Mastectomy     bilateral  . Knee surgery     left  . Dilation and curettage of uterus     Social History:  reports that she has never smoked. She has never used smokeless tobacco. She reports that she does not drink alcohol or use illicit drugs.  Allergies:  Allergies  Allergen Reactions  . Adhesive (Tape) Other (See Comments)    Skin turns red   . Diphenhydramine Other (See Comments)    Pt feels wired   . Vicodin (Hydrocodone-Acetaminophen) Nausea And Vomiting    Medications:aspirin,lipitor,calcium,nexium,aromasin,flonase,advair,glucophage, multivitamin,micardis,accolate  Labs: WBC 5.8  HGB 12.6   PLT 227K   PT 13.3  INR  0.99  PTT 27 Review of Systems  Constitutional: Negative for fever and chills.  HENT: Positive for congestion.   Respiratory: Positive for cough. Negative for shortness of breath.   Cardiovascular: Negative for chest pain.  Gastrointestinal: Negative for nausea, vomiting and abdominal pain.  Neurological: Positive for headaches.  Endo/Heme/Allergies: Does not bruise/bleed easily.   Vitals:  BP 132/83   HR 81  R 18  TEMP 98.1  O2 SATS 96% RA Physical Exam  Constitutional: She is oriented to person, place, and time. She appears well-developed and well-nourished.  Neck: Normal range of motion.       Mildly enlarged left supraclavicular lymph node    Cardiovascular: Normal rate, regular rhythm and normal heart sounds.   Respiratory: Effort normal and breath sounds normal.  GI: Soft. Bowel sounds are normal.  Musculoskeletal: Normal range of motion.  Neurological: She is alert and oriented to person, place, and time.  Psychiatric: She has a normal mood and affect.     Assessment/Plan Patient with history of breast cancer and mildly enlarged left supraclaviular lymph node (s/p nondiagnostic biopsy 3/09); plan is for US guided biopsy of left supraclavicular lymph node.  Ellis Mehaffey,D KEVIN 08/13/2011, 1:36 PM

## 2011-08-13 NOTE — Procedures (Signed)
Successful US guided FNA biopsy of minimally enlarged L supraclavicular LN.  No immediate complications.

## 2011-08-14 LAB — GLUCOSE, CAPILLARY

## 2011-08-17 ENCOUNTER — Other Ambulatory Visit: Payer: Self-pay | Admitting: Oncology

## 2011-08-17 DIAGNOSIS — C50919 Malignant neoplasm of unspecified site of unspecified female breast: Secondary | ICD-10-CM

## 2011-08-17 NOTE — Progress Notes (Signed)
CS N. [id number]  I called the patient to let her know that V. lymph node biopsy done on the left supraclavicular lymph node a few days ago is negative for malignancy. This is the second negative biopsy of this area.

## 2011-08-22 ENCOUNTER — Telehealth: Payer: Self-pay | Admitting: Oncology

## 2011-08-22 NOTE — Telephone Encounter (Signed)
Talked to pt left,gave her appt for January , reminded her of appt for December.

## 2011-09-14 ENCOUNTER — Other Ambulatory Visit: Payer: Medicare Other | Admitting: Lab

## 2011-10-15 ENCOUNTER — Other Ambulatory Visit: Payer: Self-pay | Admitting: Oncology

## 2011-10-15 ENCOUNTER — Ambulatory Visit (HOSPITAL_BASED_OUTPATIENT_CLINIC_OR_DEPARTMENT_OTHER): Payer: Medicare Other

## 2011-10-15 DIAGNOSIS — E119 Type 2 diabetes mellitus without complications: Secondary | ICD-10-CM | POA: Diagnosis not present

## 2011-10-15 DIAGNOSIS — C50419 Malignant neoplasm of upper-outer quadrant of unspecified female breast: Secondary | ICD-10-CM | POA: Diagnosis not present

## 2011-10-15 LAB — COMPREHENSIVE METABOLIC PANEL
ALT: 24 U/L (ref 0–35)
Albumin: 4.4 g/dL (ref 3.5–5.2)
CO2: 24 mEq/L (ref 19–32)
Calcium: 9.8 mg/dL (ref 8.4–10.5)
Chloride: 106 mEq/L (ref 96–112)
Glucose, Bld: 96 mg/dL (ref 70–99)
Sodium: 141 mEq/L (ref 135–145)
Total Bilirubin: 0.4 mg/dL (ref 0.3–1.2)
Total Protein: 6.8 g/dL (ref 6.0–8.3)

## 2011-10-15 LAB — CBC WITH DIFFERENTIAL/PLATELET
BASO%: 0.6 % (ref 0.0–2.0)
Eosinophils Absolute: 0.2 10*3/uL (ref 0.0–0.5)
HCT: 37.6 % (ref 34.8–46.6)
LYMPH%: 39.6 % (ref 14.0–49.7)
MONO#: 0.5 10*3/uL (ref 0.1–0.9)
NEUT#: 2.6 10*3/uL (ref 1.5–6.5)
Platelets: 265 10*3/uL (ref 145–400)
RBC: 4.14 10*6/uL (ref 3.70–5.45)
WBC: 5.5 10*3/uL (ref 3.9–10.3)
lymph#: 2.2 10*3/uL (ref 0.9–3.3)

## 2011-10-15 LAB — LACTATE DEHYDROGENASE: LDH: 159 U/L (ref 94–250)

## 2011-10-29 DIAGNOSIS — J45909 Unspecified asthma, uncomplicated: Secondary | ICD-10-CM | POA: Diagnosis not present

## 2011-10-29 DIAGNOSIS — L2089 Other atopic dermatitis: Secondary | ICD-10-CM | POA: Diagnosis not present

## 2011-10-30 ENCOUNTER — Encounter: Payer: Self-pay | Admitting: Oncology

## 2011-10-30 ENCOUNTER — Telehealth: Payer: Self-pay | Admitting: Oncology

## 2011-10-30 ENCOUNTER — Ambulatory Visit (HOSPITAL_BASED_OUTPATIENT_CLINIC_OR_DEPARTMENT_OTHER): Payer: Medicare Other | Admitting: Oncology

## 2011-10-30 VITALS — BP 149/89 | HR 84 | Temp 98.8°F | Ht 62.5 in | Wt 175.8 lb

## 2011-10-30 DIAGNOSIS — M1990 Unspecified osteoarthritis, unspecified site: Secondary | ICD-10-CM

## 2011-10-30 DIAGNOSIS — C50919 Malignant neoplasm of unspecified site of unspecified female breast: Secondary | ICD-10-CM

## 2011-10-30 DIAGNOSIS — E119 Type 2 diabetes mellitus without complications: Secondary | ICD-10-CM

## 2011-10-30 NOTE — Telephone Encounter (Signed)
Mammogram for 2013 has been done , per pt. She will call them so Dr. Reece Agar will have a copy.

## 2011-11-01 NOTE — Progress Notes (Signed)
Hematology and Oncology Follow Up Visit  [name] [id number] May 04, 1946 66 y.o. 11/01/2011 12:01 PM   Principle Diagnosis: Encounter Diagnoses  Name Primary?  . Breast cancer, stage 2 Yes  . DM w/o complication type II      Interim History:  Followup visit for this pleasant 66 year old woman with history of metachronous primary bilateral breast cancers. She is doing well. She has had no interim medical problems. Her arthritis is acting up. She is taking some chondroitin sulfate and feels this is helping. No new areas of bone pain other than those related to her arthritis. No headache no change in vision. No vaginal bleeding. No change in bowel habit.  Medications: reviewed  Allergies:  Allergies  Allergen Reactions  . Adhesive (Tape) Other (See Comments)    Skin turns red   . Diphenhydramine Other (See Comments)    Pt feels wired   . Vicodin (Hydrocodone-Acetaminophen) Nausea And Vomiting    Review of Systems: Constitutional:   No constitutional symptoms Respiratory: No cough or dyspnea Cardiovascular:  No chest pain or palpitations Gastrointestinal: No abdominal pain hematochezia or melena Genito-Urinary: See above Musculoskeletal: See above Neurologic: See above Skin: No rash Remaining ROS negative.  Physical Exam: Blood pressure 149/89, pulse 84, temperature 98.8 F (37.1 C), temperature source Oral, height 5' 2.5" (1.588 m), weight 175 lb 12.8 oz (79.742 kg). Wt Readings from Last 3 Encounters:  10/30/11 175 lb 12.8 oz (79.742 kg)  08/13/11 170 lb (77.111 kg)  07/25/09 168 lb (76.204 kg)     General appearance: Well-nourished Caucasian woman HENNT: Pharynx no erythema or exudate Lymph nodes: No cervical supraclavicular or axillary adenopathy Breasts: Right mastectomy left breast implant Lungs: Clear to auscultation resonant to percussion Heart: Regular cardiac rhythm no murmur Abdomen: Soft nontender no mass no organomegaly Extremities: No edema no  calf tenderness Vascular: No cyanosis Neurologic: Motor strength 5 over 5 reflexes 1+ symmetric Skin: No rash or ecchymosis  Lab Results: Lab Results  Component Value Date   WBC 5.5 10/15/2011   HGB 12.8 10/15/2011   HCT 37.6 10/15/2011   MCV 90.8 10/15/2011   PLT 265 10/15/2011     Chemistry      Component Value Date/Time   NA 141 10/15/2011 1120   K 3.9 10/15/2011 1120   CL 106 10/15/2011 1120   CO2 24 10/15/2011 1120   BUN 22 10/15/2011 1120   CREATININE 0.79 10/15/2011 1120      Component Value Date/Time   CALCIUM 9.8 10/15/2011 1120   ALKPHOS 75 10/15/2011 1120   AST 22 10/15/2011 1120   ALT 24 10/15/2011 1120   BILITOT 0.4 10/15/2011 1120       Radiological Studies: Most recent mammogram done 02/17/11 it [facility] shows a TRAM reconstruction on the left unremarkable no mass. Right mastectomy.   Impression and Plan: #1. Metachronous primary bilateral breast cancers. Initial stage II 1.5 cm ER negative one node positive cancer left breast diagnosed October 1999 treated with mastectomy with immediate TRAM reconstruction followed by 4 cycles of Adriamycin Cytoxan chemotherapy then 5 years of tamoxifen hormonal therapy. Second primary 3.2 cm ER PR positive HER-2 negative one node positive cancer of the right breast diagnosed in April 2008 status post right mastectomy 02/04/2007 followed by 4 cycles of carboplatinum plus Taxotere chemotherapy given between may 28th and 05/04/2007. Initial Arimidex hormonal therapy changed to Aromasin do to poor tolerance of Arimidex. She remains on Aromasin at this time. She will complete 5 years this  July. No gross signs of new disease at this time. #2. Type 2 diabetes. Controlled on oral agent. #3. Degenerative arthritis.   CC:. Dr. Charlesetta Shanks;  [facility]   Levert Feinstein, MD 1/27/201312:01 PM

## 2011-11-04 NOTE — Progress Notes (Signed)
Pt given script signed by Dr Cyndie Chime on 1/25 stating:  Prosthesis Bras #2 DX Breast Ca; S/P Mastectomy.   dph

## 2011-11-11 ENCOUNTER — Encounter: Payer: Self-pay | Admitting: Oncology

## 2011-11-11 DIAGNOSIS — L301 Dyshidrosis [pompholyx]: Secondary | ICD-10-CM | POA: Diagnosis not present

## 2011-12-24 DIAGNOSIS — E119 Type 2 diabetes mellitus without complications: Secondary | ICD-10-CM | POA: Diagnosis not present

## 2011-12-24 DIAGNOSIS — J309 Allergic rhinitis, unspecified: Secondary | ICD-10-CM | POA: Diagnosis not present

## 2011-12-24 DIAGNOSIS — J45909 Unspecified asthma, uncomplicated: Secondary | ICD-10-CM | POA: Diagnosis not present

## 2011-12-24 DIAGNOSIS — E785 Hyperlipidemia, unspecified: Secondary | ICD-10-CM | POA: Diagnosis not present

## 2012-03-02 NOTE — Progress Notes (Signed)
Script mailed to pt's home for 2 additional prosthesis bras. dph

## 2012-03-11 DIAGNOSIS — M779 Enthesopathy, unspecified: Secondary | ICD-10-CM | POA: Diagnosis not present

## 2012-03-23 DIAGNOSIS — E785 Hyperlipidemia, unspecified: Secondary | ICD-10-CM | POA: Diagnosis not present

## 2012-03-23 DIAGNOSIS — E119 Type 2 diabetes mellitus without complications: Secondary | ICD-10-CM | POA: Diagnosis not present

## 2012-04-29 DIAGNOSIS — M79609 Pain in unspecified limb: Secondary | ICD-10-CM | POA: Diagnosis not present

## 2012-04-29 DIAGNOSIS — Z79899 Other long term (current) drug therapy: Secondary | ICD-10-CM | POA: Diagnosis not present

## 2012-04-29 DIAGNOSIS — E236 Other disorders of pituitary gland: Secondary | ICD-10-CM | POA: Diagnosis not present

## 2012-04-29 DIAGNOSIS — G589 Mononeuropathy, unspecified: Secondary | ICD-10-CM | POA: Diagnosis not present

## 2012-04-29 DIAGNOSIS — R6889 Other general symptoms and signs: Secondary | ICD-10-CM | POA: Diagnosis not present

## 2012-05-17 DIAGNOSIS — M779 Enthesopathy, unspecified: Secondary | ICD-10-CM | POA: Diagnosis not present

## 2012-05-17 DIAGNOSIS — M79609 Pain in unspecified limb: Secondary | ICD-10-CM | POA: Diagnosis not present

## 2012-05-17 DIAGNOSIS — M722 Plantar fascial fibromatosis: Secondary | ICD-10-CM | POA: Diagnosis not present

## 2012-05-18 DIAGNOSIS — Z853 Personal history of malignant neoplasm of breast: Secondary | ICD-10-CM | POA: Diagnosis not present

## 2012-05-31 DIAGNOSIS — M722 Plantar fascial fibromatosis: Secondary | ICD-10-CM | POA: Diagnosis not present

## 2012-05-31 DIAGNOSIS — Z79899 Other long term (current) drug therapy: Secondary | ICD-10-CM | POA: Diagnosis not present

## 2012-06-13 ENCOUNTER — Inpatient Hospital Stay (HOSPITAL_COMMUNITY)
Admission: EM | Admit: 2012-06-13 | Discharge: 2012-06-15 | DRG: 287 | Disposition: A | Discharge status: Home / Self Care | Payer: Medicare Other | Attending: Cardiology | Admitting: Cardiology

## 2012-06-13 ENCOUNTER — Emergency Department (HOSPITAL_COMMUNITY): Payer: Medicare Other

## 2012-06-13 ENCOUNTER — Encounter (HOSPITAL_COMMUNITY): Payer: Self-pay

## 2012-06-13 DIAGNOSIS — J45909 Unspecified asthma, uncomplicated: Secondary | ICD-10-CM | POA: Diagnosis present

## 2012-06-13 DIAGNOSIS — R0789 Other chest pain: Secondary | ICD-10-CM | POA: Diagnosis not present

## 2012-06-13 DIAGNOSIS — E785 Hyperlipidemia, unspecified: Secondary | ICD-10-CM | POA: Diagnosis not present

## 2012-06-13 DIAGNOSIS — I499 Cardiac arrhythmia, unspecified: Secondary | ICD-10-CM | POA: Diagnosis not present

## 2012-06-13 DIAGNOSIS — E119 Type 2 diabetes mellitus without complications: Secondary | ICD-10-CM | POA: Diagnosis not present

## 2012-06-13 DIAGNOSIS — I517 Cardiomegaly: Secondary | ICD-10-CM | POA: Diagnosis not present

## 2012-06-13 DIAGNOSIS — I428 Other cardiomyopathies: Secondary | ICD-10-CM | POA: Diagnosis not present

## 2012-06-13 DIAGNOSIS — I10 Essential (primary) hypertension: Secondary | ICD-10-CM | POA: Diagnosis not present

## 2012-06-13 DIAGNOSIS — R079 Chest pain, unspecified: Secondary | ICD-10-CM

## 2012-06-13 DIAGNOSIS — I200 Unstable angina: Secondary | ICD-10-CM | POA: Diagnosis present

## 2012-06-13 DIAGNOSIS — R072 Precordial pain: Secondary | ICD-10-CM | POA: Diagnosis not present

## 2012-06-13 DIAGNOSIS — R9431 Abnormal electrocardiogram [ECG] [EKG]: Secondary | ICD-10-CM

## 2012-06-13 DIAGNOSIS — R0609 Other forms of dyspnea: Secondary | ICD-10-CM | POA: Diagnosis not present

## 2012-06-13 DIAGNOSIS — K449 Diaphragmatic hernia without obstruction or gangrene: Secondary | ICD-10-CM | POA: Diagnosis present

## 2012-06-13 DIAGNOSIS — I447 Left bundle-branch block, unspecified: Secondary | ICD-10-CM | POA: Diagnosis present

## 2012-06-13 DIAGNOSIS — Z853 Personal history of malignant neoplasm of breast: Secondary | ICD-10-CM | POA: Diagnosis not present

## 2012-06-13 DIAGNOSIS — M129 Arthropathy, unspecified: Secondary | ICD-10-CM | POA: Diagnosis present

## 2012-06-13 DIAGNOSIS — K219 Gastro-esophageal reflux disease without esophagitis: Secondary | ICD-10-CM | POA: Diagnosis present

## 2012-06-13 DIAGNOSIS — E782 Mixed hyperlipidemia: Secondary | ICD-10-CM | POA: Diagnosis present

## 2012-06-13 DIAGNOSIS — R0989 Other specified symptoms and signs involving the circulatory and respiratory systems: Secondary | ICD-10-CM | POA: Diagnosis not present

## 2012-06-13 LAB — URINALYSIS, ROUTINE W REFLEX MICROSCOPIC
Bilirubin Urine: NEGATIVE
Glucose, UA: NEGATIVE mg/dL
Ketones, ur: NEGATIVE mg/dL
Nitrite: NEGATIVE
Specific Gravity, Urine: 1.011 (ref 1.005–1.030)
pH: 5.5 (ref 5.0–8.0)

## 2012-06-13 LAB — COMPREHENSIVE METABOLIC PANEL
ALT: 25 U/L (ref 0–35)
AST: 23 U/L (ref 0–37)
Albumin: 3.8 g/dL (ref 3.5–5.2)
Alkaline Phosphatase: 90 U/L (ref 39–117)
BUN: 15 mg/dL (ref 6–23)
Chloride: 102 mEq/L (ref 96–112)
Potassium: 4.2 mEq/L (ref 3.5–5.1)
Sodium: 136 mEq/L (ref 135–145)
Total Bilirubin: 0.3 mg/dL (ref 0.3–1.2)
Total Protein: 7.1 g/dL (ref 6.0–8.3)

## 2012-06-13 LAB — CBC WITH DIFFERENTIAL/PLATELET
Basophils Absolute: 0 10*3/uL (ref 0.0–0.1)
Basophils Relative: 0 % (ref 0–1)
Eosinophils Absolute: 0.2 10*3/uL (ref 0.0–0.7)
Hemoglobin: 13 g/dL (ref 12.0–15.0)
MCH: 31.4 pg (ref 26.0–34.0)
MCHC: 34.2 g/dL (ref 30.0–36.0)
Neutro Abs: 3.2 10*3/uL (ref 1.7–7.7)
Neutrophils Relative %: 48 % (ref 43–77)
Platelets: 242 10*3/uL (ref 150–400)
RDW: 12.8 % (ref 11.5–15.5)

## 2012-06-13 LAB — PRO B NATRIURETIC PEPTIDE: Pro B Natriuretic peptide (BNP): 155.2 pg/mL — ABNORMAL HIGH (ref 0–125)

## 2012-06-13 LAB — PROTIME-INR
INR: 1.04 (ref 0.00–1.49)
Prothrombin Time: 13.8 seconds (ref 11.6–15.2)

## 2012-06-13 LAB — TROPONIN I
Troponin I: <0.3 ng/mL (ref <0.30)
Troponin I: <0.3 ng/mL (ref <0.30)

## 2012-06-13 LAB — URINE MICROSCOPIC-ADD ON

## 2012-06-13 MED ORDER — ATORVASTATIN CALCIUM 80 MG PO TABS
80.0000 mg | ORAL_TABLET | Freq: Every day | ORAL | Status: DC
  Administered 2012-06-13 – 2012-06-14 (×2): 80 mg via ORAL
  Filled 2012-06-13 (×3): qty 1

## 2012-06-13 MED ORDER — HEPARIN (PORCINE) IN NACL 100-0.45 UNIT/ML-% IJ SOLN
1000.0000 [IU]/h | INTRAMUSCULAR | Status: DC
  Administered 2012-06-14: 1000 [IU]/h via INTRAVENOUS
  Filled 2012-06-13: qty 250

## 2012-06-13 MED ORDER — ACETAMINOPHEN 325 MG PO TABS: 650.0000 mg | ORAL_TABLET | ORAL | Status: DC | PRN

## 2012-06-13 MED ORDER — ONDANSETRON HCL 4 MG/2ML IJ SOLN: 4.0000 mg | Freq: Four times a day (QID) | INTRAMUSCULAR | Status: DC | PRN

## 2012-06-13 MED ORDER — PANTOPRAZOLE SODIUM 40 MG PO TBEC
40.0000 mg | DELAYED_RELEASE_TABLET | Freq: Every day | ORAL | Status: DC
  Administered 2012-06-14: 40 mg via ORAL
  Filled 2012-06-13: qty 1

## 2012-06-13 MED ORDER — CO Q 10 10 MG PO CAPS: 10.0000 mg | ORAL_CAPSULE | Freq: Every day | ORAL | Status: DC

## 2012-06-13 MED ORDER — ADULT MULTIVITAMIN W/MINERALS CH
1.0000 | ORAL_TABLET | Freq: Every day | ORAL | Status: DC
  Administered 2012-06-14: 1 via ORAL
  Filled 2012-06-13 (×2): qty 1

## 2012-06-13 MED ORDER — METOPROLOL TARTRATE 12.5 MG HALF TABLET
12.5000 mg | ORAL_TABLET | Freq: Two times a day (BID) | ORAL | Status: DC
  Administered 2012-06-14 (×2): 12.5 mg via ORAL
  Filled 2012-06-13 (×5): qty 1

## 2012-06-13 MED ORDER — FLUTICASONE PROPIONATE 50 MCG/ACT NA SUSP: 2.0000 | Freq: Two times a day (BID) | NASAL | Status: DC | PRN

## 2012-06-13 MED ORDER — THERA M PLUS PO TABS: 1.0000 | ORAL_TABLET | Freq: Every day | ORAL | Status: DC

## 2012-06-13 MED ORDER — ASPIRIN EC 81 MG PO TBEC
81.0000 mg | DELAYED_RELEASE_TABLET | Freq: Every day | ORAL | Status: DC
  Administered 2012-06-14: 81 mg via ORAL
  Filled 2012-06-13 (×2): qty 1

## 2012-06-13 MED ORDER — VITAMIN C 500 MG PO TABS
500.0000 mg | ORAL_TABLET | Freq: Two times a day (BID) | ORAL | Status: DC
  Administered 2012-06-14 (×2): 500 mg via ORAL
  Filled 2012-06-13 (×5): qty 1

## 2012-06-13 MED ORDER — HEPARIN BOLUS VIA INFUSION
4000.0000 [IU] | Freq: Once | INTRAVENOUS | Status: AC
  Administered 2012-06-14: 4000 [IU] via INTRAVENOUS
  Filled 2012-06-13: qty 4000

## 2012-06-13 MED ORDER — CALCIUM CARBONATE-VITAMIN D 500-200 MG-UNIT PO TABS
1.0000 | ORAL_TABLET | Freq: Two times a day (BID) | ORAL | Status: DC
  Administered 2012-06-14 (×2): 1 via ORAL
  Filled 2012-06-13 (×5): qty 1

## 2012-06-13 MED ORDER — MONTELUKAST SODIUM 10 MG PO TABS
10.0000 mg | ORAL_TABLET | Freq: Every day | ORAL | Status: DC
  Administered 2012-06-14: 10 mg via ORAL
  Filled 2012-06-13 (×3): qty 1

## 2012-06-13 MED ORDER — FLUTICASONE-SALMETEROL 250-50 MCG/DOSE IN AEPB: 1.0000 | INHALATION_SPRAY | Freq: Two times a day (BID) | RESPIRATORY_TRACT | Status: DC | PRN

## 2012-06-13 MED ORDER — CALCIUM-VITAMIN D 500-200 MG-UNIT PO TABS: 1.0000 | ORAL_TABLET | Freq: Two times a day (BID) | ORAL | Status: DC

## 2012-06-13 MED ORDER — VITAMIN E 180 MG (400 UNIT) PO CAPS
400.0000 [IU] | ORAL_CAPSULE | Freq: Every day | ORAL | Status: DC
  Administered 2012-06-14: 400 [IU] via ORAL
  Filled 2012-06-13 (×2): qty 1

## 2012-06-13 MED ORDER — IOHEXOL 350 MG/ML SOLN
80.0000 mL | Freq: Once | INTRAVENOUS | Status: AC | PRN
  Administered 2012-06-13: 80 mL via INTRAVENOUS

## 2012-06-13 MED ORDER — NITROGLYCERIN 0.4 MG SL SUBL: 0.4000 mg | SUBLINGUAL_TABLET | SUBLINGUAL | Status: DC | PRN

## 2012-06-13 MED ORDER — IRBESARTAN 75 MG PO TABS
75.0000 mg | ORAL_TABLET | Freq: Every day | ORAL | Status: DC
  Administered 2012-06-14: 75 mg via ORAL
  Filled 2012-06-13 (×2): qty 1

## 2012-06-13 NOTE — Progress Notes (Signed)
ANTICOAGULATION CONSULT NOTE - Initial Consult  Pharmacy Consult for Heparin Indication: chest pain/ACS  Allergies  Allergen Reactions  . Adhesive (Tape) Other (See Comments)    Skin turns red   . Diphenhydramine Other (See Comments)    Pt feels wired   . Vicodin (Hydrocodone-Acetaminophen) Nausea And Vomiting    Patient Measurements: Height: 5\' 2"  (157.5 cm) Weight: 174 lb 9.7 oz (79.2 kg) IBW/kg (Calculated) : 50.1  Heparin Dosing Weight: 70   Vital Signs: Temp: 98.2 F (36.8 C) (09/09 1755) Temp src: Oral (09/09 1755) BP: 131/75 mmHg (09/09 2202) Pulse Rate: 83  (09/09 2202)  Labs:  Basename 06/13/12 1830 06/13/12 1829  HGB -- 13.0  HCT -- 38.0  PLT -- 242  APTT -- --  LABPROT -- 13.8  INR -- 1.04  HEPARINUNFRC -- --  CREATININE -- 0.64  CKTOTAL -- --  CKMB -- --  TROPONINI <0.30 --    Estimated Creatinine Clearance: 65.6 ml/min (by C-G formula based on Cr of 0.64).   Medical History: Past Medical History  Diagnosis Date  . Cancer     breast ca  . Asthma   . GERD (gastroesophageal reflux disease)   . Diabetes mellitus   . Arthritis   . Allergy history unknown   . Hiatal hernia   . Leg cramps   . Hypertension   . Elevated cholesterol   . DM w/o complication type II 10/30/2011    Medications:  Prescriptions prior to admission  Medication Sig Dispense Refill  . aspirin EC 81 MG tablet Take 81 mg by mouth daily.        Marland Kitchen atorvastatin (LIPITOR) 20 MG tablet Take 40 mg by mouth every evening.       . Calcium Carbonate-Vitamin D (CALCIUM-VITAMIN D) 500-200 MG-UNIT per tablet Take 1 tablet by mouth 2 (two) times daily with a meal.       . cetirizine (ZYRTEC) 10 MG tablet Take 10 mg by mouth every evening.      Marland Kitchen CINNAMON PO Take 1 tablet by mouth daily.        . Coenzyme Q10 (CO Q 10 PO) Take 1 capsule by mouth daily.        Marland Kitchen esomeprazole (NEXIUM) 20 MG capsule Take 20 mg by mouth daily before breakfast.        . Flaxseed, Linseed, (FLAX SEED OIL  PO) Take 1 capsule by mouth daily.        . fluticasone (FLONASE) 50 MCG/ACT nasal spray Place 2 sprays into the nose 2 (two) times daily as needed. allergies       . Fluticasone-Salmeterol (ADVAIR) 250-50 MCG/DOSE AEPB Inhale 1 puff into the lungs every 12 (twelve) hours as needed. Wheezing        . Glucosamine-Chondroitin (GLUCOSAMINE CHONDR COMPLEX PO) Take 1 tablet by mouth daily.        . metFORMIN (GLUCOPHAGE-XR) 500 MG 24 hr tablet Take 500 mg by mouth every evening.       . Multiple Vitamins-Minerals (MULTIVITAMINS THER. W/MINERALS) TABS Take 1 tablet by mouth daily.        Marland Kitchen OVER THE COUNTER MEDICATION Take 1 tablet by mouth daily. Leg cramps       . telmisartan (MICARDIS) 20 MG tablet Take 20 mg by mouth daily.        . vitamin C (ASCORBIC ACID) 500 MG tablet Take 500 mg by mouth 2 (two) times daily.        . vitamin E  400 UNIT capsule Take 400 Units by mouth daily.        . zafirlukast (ACCOLATE) 20 MG tablet Take 20 mg by mouth 2 (two) times daily.          Assessment: 66 yo female with chest pain for Heparin Goal of Therapy:  Heparin level 0.3-0.7 units/ml Monitor platelets by anticoagulation protocol: Yes   Plan:  Heparin 4000 units IV bolus, then 1000 units/hr Check heparin level in 6 hours.  Tabitha Riggins, Gary Fleet 06/13/2012,10:47 PM

## 2012-06-13 NOTE — ED Provider Notes (Signed)
History     CSN: [id number]  Arrival date & time 06/13/12  1815   First MD Initiated Contact with Patient 06/13/12 1818      Chief Complaint  Patient presents with  . Shortness of Breath  . Chest Pain    (Consider location/radiation/quality/duration/timing/severity/associated sxs/prior treatment) HPI Comments: Patient presents via EMS from her PCPs office with chest pressure and shortness of breath as been intermittent over the past several months. She's not felt well for the past 2 days. Her PCPs office she was told she had a heart murmur and an abnormal EKG. She denies any cardiac history and had negative stress test 8 years ago. She problems with her breathing throughout the summer which he thought was related to her asthma. Chest pressure she gets is not exertional and does not radiate. She denies any leg pain or swelling. His remote history of breast cancer and took a recent plane trip.  The history is provided by the patient.    Past Medical History  Diagnosis Date  . Cancer     breast ca  . Asthma   . GERD (gastroesophageal reflux disease)   . Diabetes mellitus   . Arthritis   . Allergy history unknown   . Hiatal hernia   . Leg cramps   . Hypertension   . Elevated cholesterol   . DM w/o complication type II 10/30/2011    Past Surgical History  Procedure Date  . Mastectomy     bilateral  . Knee surgery     left  . Dilation and curettage of uterus     History reviewed. No pertinent family history.  History  Substance Use Topics  . Smoking status: Never Smoker   . Smokeless tobacco: Never Used  . Alcohol Use: No    OB History    Grav Para Term Preterm Abortions TAB SAB Ect Mult Living                  Review of Systems  Constitutional: Negative for fever, activity change and appetite change.  HENT: Negative for congestion, sore throat and rhinorrhea.   Respiratory: Positive for chest tightness and shortness of breath. Negative for cough.     Cardiovascular: Positive for chest pain.  Gastrointestinal: Negative for nausea, vomiting and abdominal pain.  Genitourinary: Negative for dysuria and hematuria.  Musculoskeletal: Negative for back pain.  Skin: Negative for rash.  Neurological: Negative for dizziness, light-headedness and headaches.    Allergies  Adhesive; Diphenhydramine; and Vicodin  Home Medications   No current outpatient prescriptions on file.  BP 132/62  Pulse 79  Temp 98.3 F (36.8 C) (Oral)  Resp 14  Ht 5\' 2"  (1.575 m)  Wt 174 lb 9.7 oz (79.2 kg)  BMI 31.94 kg/m2  SpO2 96%  Physical Exam  Constitutional: She is oriented to person, place, and time. She appears well-developed and well-nourished. No distress.  HENT:  Head: Normocephalic and atraumatic.  Mouth/Throat: Oropharynx is clear and moist. No oropharyngeal exudate.  Eyes: Conjunctivae are normal. Pupils are equal, round, and reactive to light.  Neck: Normal range of motion. Neck supple.  Cardiovascular: Normal rate, regular rhythm and normal heart sounds.   No murmur heard.      +2 femoral, DP, PT pulses  Pulmonary/Chest: Effort normal and breath sounds normal. No respiratory distress.  Abdominal: Soft. There is no tenderness. There is no rebound and no guarding.  Musculoskeletal: Normal range of motion. She exhibits no edema and no tenderness.  Neurological: She is alert and oriented to person, place, and time. No cranial nerve deficit.  Skin: Skin is warm.    ED Course  Procedures (including critical care time)  Labs Reviewed  COMPREHENSIVE METABOLIC PANEL - Abnormal; Notable for the following:    Glucose, Bld 127 (*)     GFR calc non Af Amer 90 (*)     All other components within normal limits  URINALYSIS, ROUTINE W REFLEX MICROSCOPIC - Abnormal; Notable for the following:    APPearance HAZY (*)     Leukocytes, UA MODERATE (*)     All other components within normal limits  PRO B NATRIURETIC PEPTIDE - Abnormal; Notable for the  following:    Pro B Natriuretic peptide (BNP) 155.2 (*)     All other components within normal limits  URINE MICROSCOPIC-ADD ON - Abnormal; Notable for the following:    Squamous Epithelial / LPF FEW (*)     All other components within normal limits  CBC WITH DIFFERENTIAL  TROPONIN I  D-DIMER, QUANTITATIVE  PROTIME-INR  TROPONIN I  TROPONIN I  TROPONIN I  TSH  HEMOGLOBIN A1C  MRSA PCR SCREENING  LIPID PANEL  CBC  BASIC METABOLIC PANEL  PROTIME-INR  HEPARIN LEVEL (UNFRACTIONATED)   Dg Chest 2 View  06/13/2012  *RADIOLOGY REPORT*  Clinical Data: Chest pain, history of breast carcinoma  CHEST - 2 VIEW  Comparison: CT chest of 07/16/2011 and chest x-ray of 02/02/2007  Findings: No active infiltrate or effusion is seen.  The heart is mildly enlarged and stable.  Surgical clips overlie the left axilla from prior axillary nodal dissection and mastectomy.  No bony abnormality is seen.  IMPRESSION: Stable chest x-ray.  No active lung disease.   Original Report Authenticated By: Juline Patch, M.D.    Ct Angio Chest W/cm &/or Wo Cm  06/13/2012  *RADIOLOGY REPORT*  Clinical Data: Shortness of breath, some chest pain, also history of bilateral breast carcinoma  CT ANGIOGRAPHY CHEST  Technique:  Multidetector CT imaging of the chest using the standard protocol during bolus administration of intravenous contrast. Multiplanar reconstructed images including MIPs were obtained and reviewed to evaluate the vascular anatomy.  Contrast: 80mL OMNIPAQUE IOHEXOL 350 MG/ML SOLN  Comparison: Chest x-ray of 06/13/2012, and CT chest of 07/16/2011  Findings: The pulmonary arteries are relatively well opacified. There is no evidence of acute pulmonary embolism.  The thoracic aorta is not well opacified and cannot be evaluated.  There is mild cardiomegaly present.  No pericardial effusion is seen.  There are a few slightly prominent hilar and mediastinal nodes, none of which are pathologically enlarged.  On the lung  window images there is patchy airspace disease bilaterally.  This may reflect atelectasis and/or obstructive pulmonary disease but no focal infiltrate or effusion is seen.  The central airway is patent.  Surgical clips overlie both axilla.  No bony abnormality is seen.  IMPRESSION:  1.  Negative CT chest for pulmonary embolism. 2.  Patchy airspace disease bilaterally which is nonspecific, possibly due to atelectasis and/or obstructive pulmonary disease. No focal infiltrate or effusion is seen.   Original Report Authenticated By: Juline Patch, M.D.      1. Chest pain   2. Abnormal EKG       MDM  Intermittent chest pressure and shortness of breath for the past several months. Lungs clear, no distress, EKG changes noted. Left bundle branch block on EMS EKG has resolved  Troponin negative. CTPE negative.  Presentation  and EKG changes d/w Dr. Daleen Squibb.  He agrees patient needs admission for further workup.     Date: 06/13/2012  Rate: 77  Rhythm: normal sinus rhythm  QRS Axis: normal  Intervals: normal  ST/T Wave abnormalities: nonspecific ST/T changes  Conduction Disutrbances:none  Narrative Interpretation: There is a new partial right bundle  branch block, T wave inversions ST depressions in the anterior inferior leads.  Old EKG Reviewed: changes noted    Glynn Octave, MD 06/13/12 2351

## 2012-06-13 NOTE — H&P (Signed)
Cardiology History and Physical  Redmond Baseman, MD  History of Present Illness (and review of medical records): [name] is a 66 y.o. female who presents for evaluation of as a transfer from her PCP office for further evaluation of abnormal ecg.  She reports generalized malaise and fatigue since am.  She also reported chest pressure 3/10 throughout the day.  Her husband had an appt today with PCP and she asked to be seen afterwards.  She had ecg revealing LBBB and was sent to ED after getting ASA/NTG.  She states that she has had some dyspnea on exertion and has had 2-3 prior episodes where she felt similar this summer but has not been evaluated.  She denies any palpitations, presynope, syncope, PND or orthopnea.  Previous diagnostic testing for coronary artery disease includes: MUGA scan. Previous history of cardiac disease includes None. Coronary artery disease risk factors include: advanced age (older than 44 for men, 81 for women), diabetes mellitus, dyslipidemia, hypertension and obesity (BMI >= 30 kg/m2). Patient denies history of CHF, coronary artery disease, previous M.I. and valvular disease.  Review of Systems Further review of systems was otherwise negative other than stated in HPI.  Patient Active Problem List   Diagnosis Date Noted  . Unstable angina 06/13/2012  . DM w/o complication type II 10/30/2011  . Breast cancer, stage 2 08/17/2011  . DYSPNEA 07/25/2009  . HYPERLIPIDEMIA-MIXED 09/19/2008  . HYPERTENSION, UNSPECIFIED 09/19/2008   Past Medical History  Diagnosis Date  . Cancer     breast ca  . Asthma   . GERD (gastroesophageal reflux disease)   . Diabetes mellitus   . Arthritis   . Allergy history unknown   . Hiatal hernia   . Leg cramps   . Hypertension   . Elevated cholesterol   . DM w/o complication type II 10/30/2011    Past Surgical History  Procedure Date  . Mastectomy     bilateral  . Knee surgery     left  . Dilation and curettage of  uterus      (Not in a hospital admission) Allergies  Allergen Reactions  . Adhesive (Tape) Other (See Comments)    Skin turns red   . Diphenhydramine Other (See Comments)    Pt feels wired   . Vicodin (Hydrocodone-Acetaminophen) Nausea And Vomiting    History  Substance Use Topics  . Smoking status: Never Smoker   . Smokeless tobacco: Never Used  . Alcohol Use: No    No family history on file.   Objective: Patient Vitals for the past 8 hrs:  BP Temp Temp src Pulse Resp SpO2  06/13/12 2045 131/81 mmHg - - 71  12  96 %  06/13/12 2032 122/73 mmHg - - 74  16  96 %  06/13/12 2030 122/73 mmHg - - 74  15  94 %  06/13/12 1801 - - - - - 98 %  06/13/12 1755 131/76 mmHg 98.2 F (36.8 C) Oral 89  20  96 %   General Appearance:    Alert, cooperative, no distress, appears stated age, obese female  Head:    Normocephalic, without obvious abnormality, atraumatic  Eyes:     PERRL, EOMI, anicteric sclerae  Neck:   Supple, no carotid bruit or JVD  Lungs:     Clear to auscultation bilaterally, respirations unlabored  Heart:    Regular rate and rhythm, S1 and S2 normal, 2/6 systolic murmur  Abdomen:     Soft, non-tender, normoactive bowel  sounds  Extremities:   Extremities normal, atraumatic, no cyanosis or edema  Pulses:   2+ and symmetric all extremities  Skin:   no rashes or lesions  Neurologic:   No focal deficits. AAO x3   Results for orders placed during the hospital encounter of 06/13/12 (from the past 48 hour(s))  URINALYSIS, ROUTINE W REFLEX MICROSCOPIC     Status: Abnormal   Collection Time   06/13/12  6:27 PM      Component Value Range Comment   Color, Urine YELLOW  YELLOW    APPearance HAZY (*) CLEAR    Specific Gravity, Urine 1.011  1.005 - 1.030    pH 5.5  5.0 - 8.0    Glucose, UA NEGATIVE  NEGATIVE mg/dL    Hgb urine dipstick NEGATIVE  NEGATIVE    Bilirubin Urine NEGATIVE  NEGATIVE    Ketones, ur NEGATIVE  NEGATIVE mg/dL    Protein, ur NEGATIVE  NEGATIVE mg/dL     Urobilinogen, UA 0.2  0.0 - 1.0 mg/dL    Nitrite NEGATIVE  NEGATIVE    Leukocytes, UA MODERATE (*) NEGATIVE   URINE MICROSCOPIC-ADD ON     Status: Abnormal   Collection Time   06/13/12  6:27 PM      Component Value Range Comment   Squamous Epithelial / LPF FEW (*) RARE    WBC, UA 7-10  <3 WBC/hpf    RBC / HPF 0-2  <3 RBC/hpf    Bacteria, UA RARE  RARE   CBC WITH DIFFERENTIAL     Status: Normal   Collection Time   06/13/12  6:29 PM      Component Value Range Comment   WBC 6.6  4.0 - 10.5 K/uL    RBC 4.14  3.87 - 5.11 MIL/uL    Hemoglobin 13.0  12.0 - 15.0 g/dL    HCT 16.1  09.6 - 04.5 %    MCV 91.8  78.0 - 100.0 fL    MCH 31.4  26.0 - 34.0 pg    MCHC 34.2  30.0 - 36.0 g/dL    RDW 40.9  81.1 - 91.4 %    Platelets 242  150 - 400 K/uL    Neutrophils Relative 48  43 - 77 %    Neutro Abs 3.2  1.7 - 7.7 K/uL    Lymphocytes Relative 40  12 - 46 %    Lymphs Abs 2.6  0.7 - 4.0 K/uL    Monocytes Relative 9  3 - 12 %    Monocytes Absolute 0.6  0.1 - 1.0 K/uL    Eosinophils Relative 2  0 - 5 %    Eosinophils Absolute 0.2  0.0 - 0.7 K/uL    Basophils Relative 0  0 - 1 %    Basophils Absolute 0.0  0.0 - 0.1 K/uL   COMPREHENSIVE METABOLIC PANEL     Status: Abnormal   Collection Time   06/13/12  6:29 PM      Component Value Range Comment   Sodium 136  135 - 145 mEq/L    Potassium 4.2  3.5 - 5.1 mEq/L HEMOLYSIS AT THIS LEVEL MAY AFFECT RESULT   Chloride 102  96 - 112 mEq/L    CO2 24  19 - 32 mEq/L    Glucose, Bld 127 (*) 70 - 99 mg/dL    BUN 15  6 - 23 mg/dL    Creatinine, Ser 7.82  0.50 - 1.10 mg/dL    Calcium 9.7  8.4 -  10.5 mg/dL    Total Protein 7.1  6.0 - 8.3 g/dL    Albumin 3.8  3.5 - 5.2 g/dL    AST 23  0 - 37 U/L    ALT 25  0 - 35 U/L    Alkaline Phosphatase 90  39 - 117 U/L    Total Bilirubin 0.3  0.3 - 1.2 mg/dL    GFR calc non Af Amer 90 (*) >90 mL/min    GFR calc Af Amer >90  >90 mL/min   D-DIMER, QUANTITATIVE     Status: Normal   Collection Time   06/13/12  6:29 PM       Component Value Range Comment   D-Dimer, Quant 0.31  0.00 - 0.48 ug/mL-FEU   PROTIME-INR     Status: Normal   Collection Time   06/13/12  6:29 PM      Component Value Range Comment   Prothrombin Time 13.8  11.6 - 15.2 seconds    INR 1.04  0.00 - 1.49   TROPONIN I     Status: Normal   Collection Time   06/13/12  6:30 PM      Component Value Range Comment   Troponin I <0.30  <0.30 ng/mL   PRO B NATRIURETIC PEPTIDE     Status: Abnormal   Collection Time   06/13/12  6:30 PM      Component Value Range Comment   Pro B Natriuretic peptide (BNP) 155.2 (*) 0 - 125 pg/mL    Dg Chest 2 View  06/13/2012  *RADIOLOGY REPORT*  Clinical Data: Chest pain, history of breast carcinoma  CHEST - 2 VIEW  Comparison: CT chest of 07/16/2011 and chest x-ray of 02/02/2007  Findings: No active infiltrate or effusion is seen.  The heart is mildly enlarged and stable.  Surgical clips overlie the left axilla from prior axillary nodal dissection and mastectomy.  No bony abnormality is seen.  IMPRESSION: Stable chest x-ray.  No active lung disease.   Original Report Authenticated By: Juline Patch, M.D.    Ct Angio Chest W/cm &/or Wo Cm  06/13/2012  *RADIOLOGY REPORT*  Clinical Data: Shortness of breath, some chest pain, also history of bilateral breast carcinoma  CT ANGIOGRAPHY CHEST  Technique:  Multidetector CT imaging of the chest using the standard protocol during bolus administration of intravenous contrast. Multiplanar reconstructed images including MIPs were obtained and reviewed to evaluate the vascular anatomy.  Contrast: 80mL OMNIPAQUE IOHEXOL 350 MG/ML SOLN  Comparison: Chest x-ray of 06/13/2012, and CT chest of 07/16/2011  Findings: The pulmonary arteries are relatively well opacified. There is no evidence of acute pulmonary embolism.  The thoracic aorta is not well opacified and cannot be evaluated.  There is mild cardiomegaly present.  No pericardial effusion is seen.  There are a few slightly prominent hilar and  mediastinal nodes, none of which are pathologically enlarged.  On the lung window images there is patchy airspace disease bilaterally.  This may reflect atelectasis and/or obstructive pulmonary disease but no focal infiltrate or effusion is seen.  The central airway is patent.  Surgical clips overlie both axilla.  No bony abnormality is seen.  IMPRESSION:  1.  Negative CT chest for pulmonary embolism. 2.  Patchy airspace disease bilaterally which is nonspecific, possibly due to atelectasis and/or obstructive pulmonary disease. No focal infiltrate or effusion is seen.   Original Report Authenticated By: Juline Patch, M.D.     ECG:  Sinus rhythm ST-T abnormalities in anterior leads  which are new from prior.  In addition, patient has had intermittent LBBB on ecg and rhythm strips in transit to ED  Assessment: 67F hx of DM, HTN, HLD, bilateral breast ca s/p mastectomy and chemotherapy presents with chest pressure, dyspnea on exertion, with abnormal ecg concerning for unstable angina.  Plan:  1. Admit to Cardiology, 2. Continuous monitoring on Telemetry. 3. Repeat ekg on admit, prn chest pain or arrythmia 4. Trend cardiac biomarkers, check lipids, hgba1c, tsh 5. Medical management to include ASA,Heparin gtt,BB, Statin, NTG prn 6. Will reassess in am and determine further ischemic evaluation at that time.

## 2012-06-13 NOTE — ED Notes (Signed)
Pt has had SOB during the summer, but has hx of asthma and thought it was related to that.  Pt has had some CP over the last two days and general feeling of not feeling well.  Pt took husband to PCP today, but ended up being seen herself.  Pt's 12 lead demonstrates LBBB.  Pt has no old EKG with PCP so it is unknown if this is new onset.  Pt given 324 mg ASA and 1 SL Nitro by PCP prior to EMS arrival.  Pt now denies CP or chest pressure.

## 2012-06-14 DIAGNOSIS — I517 Cardiomegaly: Secondary | ICD-10-CM

## 2012-06-14 DIAGNOSIS — I200 Unstable angina: Secondary | ICD-10-CM

## 2012-06-14 DIAGNOSIS — R079 Chest pain, unspecified: Secondary | ICD-10-CM

## 2012-06-14 DIAGNOSIS — R9431 Abnormal electrocardiogram [ECG] [EKG]: Secondary | ICD-10-CM

## 2012-06-14 LAB — BASIC METABOLIC PANEL
BUN: 17 mg/dL (ref 6–23)
CO2: 28 mEq/L (ref 19–32)
Calcium: 9.5 mg/dL (ref 8.4–10.5)
Creatinine, Ser: 0.76 mg/dL (ref 0.50–1.10)
Glucose, Bld: 100 mg/dL — ABNORMAL HIGH (ref 70–99)

## 2012-06-14 LAB — CBC
MCH: 31.2 pg (ref 26.0–34.0)
MCV: 92 fL (ref 78.0–100.0)
Platelets: 218 10*3/uL (ref 150–400)
RDW: 12.8 % (ref 11.5–15.5)

## 2012-06-14 LAB — LIPID PANEL
Cholesterol: 145 mg/dL (ref 0–200)
HDL: 42 mg/dL (ref >39)
Total CHOL/HDL Ratio: 3.5 RATIO
Triglycerides: 81 mg/dL (ref <150)
VLDL: 16 mg/dL (ref 0–40)

## 2012-06-14 LAB — TROPONIN I: Troponin I: <0.3 ng/mL (ref <0.30)

## 2012-06-14 LAB — GLUCOSE, CAPILLARY: Glucose-Capillary: 105 mg/dL — ABNORMAL HIGH (ref 70–99)

## 2012-06-14 LAB — TSH: TSH: 4.884 u[IU]/mL — ABNORMAL HIGH (ref 0.350–4.500)

## 2012-06-14 LAB — HEPARIN LEVEL (UNFRACTIONATED): Heparin Unfractionated: 0.32 IU/mL (ref 0.30–0.70)

## 2012-06-14 LAB — MRSA PCR SCREENING: MRSA by PCR: NEGATIVE

## 2012-06-14 MED ORDER — SODIUM CHLORIDE 0.9 % IV SOLN: 250.0000 mL | INTRAVENOUS | Status: DC | PRN

## 2012-06-14 MED ORDER — SODIUM CHLORIDE 0.9 % IJ SOLN: 3.0000 mL | INTRAMUSCULAR | Status: DC | PRN

## 2012-06-14 MED ORDER — SODIUM CHLORIDE 0.9 % IJ SOLN: 3.0000 mL | Freq: Two times a day (BID) | INTRAMUSCULAR | Status: DC

## 2012-06-14 MED ORDER — SODIUM CHLORIDE 0.9 % IV SOLN
1.0000 mL/kg/h | INTRAVENOUS | Status: DC
  Administered 2012-06-15: 1 mL/kg/h via INTRAVENOUS

## 2012-06-14 MED ORDER — HEPARIN (PORCINE) IN NACL 100-0.45 UNIT/ML-% IJ SOLN
1350.0000 [IU]/h | INTRAMUSCULAR | Status: DC
  Administered 2012-06-14: 1250 [IU]/h via INTRAVENOUS
  Filled 2012-06-14 (×3): qty 250

## 2012-06-14 MED ORDER — ASPIRIN 81 MG PO CHEW
324.0000 mg | CHEWABLE_TABLET | ORAL | Status: AC
  Administered 2012-06-15: 324 mg via ORAL
  Filled 2012-06-14: qty 4

## 2012-06-14 MED ORDER — DIAZEPAM 2 MG PO TABS
2.0000 mg | ORAL_TABLET | ORAL | Status: AC
  Administered 2012-06-15: 2 mg via ORAL
  Filled 2012-06-14: qty 1

## 2012-06-14 MED ORDER — HEPARIN BOLUS VIA INFUSION
2500.0000 [IU] | Freq: Once | INTRAVENOUS | Status: AC
  Administered 2012-06-14: 2500 [IU] via INTRAVENOUS
  Filled 2012-06-14: qty 2500

## 2012-06-14 NOTE — Progress Notes (Signed)
ANTICOAGULATION CONSULT NOTE - Initial Consult  Pharmacy Consult for Heparin Indication: chest pain/ACS  Allergies  Allergen Reactions  . Adhesive (Tape) Other (See Comments)    Skin turns red   . Diphenhydramine Other (See Comments)    Pt feels wired   . Vicodin (Hydrocodone-Acetaminophen) Nausea And Vomiting    Patient Measurements: Height: 5\' 2"  (157.5 cm) Weight: 174 lb 9.7 oz (79.2 kg) IBW/kg (Calculated) : 50.1  Heparin Dosing Weight: 70   Vital Signs: Temp: 97.5 F (36.4 C) (09/10 0813) Temp src: Oral (09/10 0813) BP: 123/58 mmHg (09/10 0813) Pulse Rate: 72  (09/10 0550)  Labs:  Basename 06/14/12 0701 06/13/12 2206 06/13/12 1830 06/13/12 1829  HGB 12.8 -- -- 13.0  HCT 37.7 -- -- 38.0  PLT 218 -- -- 242  APTT -- -- -- --  LABPROT 14.8 -- -- 13.8  INR 1.14 -- -- 1.04  HEPARINUNFRC <0.10* -- -- --  CREATININE 0.76 -- -- 0.64  CKTOTAL -- -- -- --  CKMB -- -- -- --  TROPONINI <0.30 <0.30 <0.30 --    Estimated Creatinine Clearance: 65.6 ml/min (by C-G formula based on Cr of 0.76).   Medical History: Past Medical History  Diagnosis Date  . Cancer     breast ca  . Asthma   . GERD (gastroesophageal reflux disease)   . Diabetes mellitus   . Arthritis   . Allergy history unknown   . Hiatal hernia   . Leg cramps   . Hypertension   . Elevated cholesterol   . DM w/o complication type II 10/30/2011    Medications:  Prescriptions prior to admission  Medication Sig Dispense Refill  . aspirin EC 81 MG tablet Take 81 mg by mouth daily.        Marland Kitchen atorvastatin (LIPITOR) 20 MG tablet Take 40 mg by mouth every evening.       . Calcium Carbonate-Vitamin D (CALCIUM-VITAMIN D) 500-200 MG-UNIT per tablet Take 1 tablet by mouth 2 (two) times daily with a meal.       . cetirizine (ZYRTEC) 10 MG tablet Take 10 mg by mouth every evening.      Marland Kitchen CINNAMON PO Take 1 tablet by mouth daily.        . Coenzyme Q10 (CO Q 10 PO) Take 1 capsule by mouth daily.        Marland Kitchen  esomeprazole (NEXIUM) 20 MG capsule Take 20 mg by mouth daily before breakfast.        . Flaxseed, Linseed, (FLAX SEED OIL PO) Take 1 capsule by mouth daily.        . fluticasone (FLONASE) 50 MCG/ACT nasal spray Place 2 sprays into the nose 2 (two) times daily as needed. allergies       . Fluticasone-Salmeterol (ADVAIR) 250-50 MCG/DOSE AEPB Inhale 1 puff into the lungs every 12 (twelve) hours as needed. Wheezing        . Glucosamine-Chondroitin (GLUCOSAMINE CHONDR COMPLEX PO) Take 1 tablet by mouth daily.        . metFORMIN (GLUCOPHAGE-XR) 500 MG 24 hr tablet Take 500 mg by mouth every evening.       . Multiple Vitamins-Minerals (MULTIVITAMINS THER. W/MINERALS) TABS Take 1 tablet by mouth daily.        Marland Kitchen OVER THE COUNTER MEDICATION Take 1 tablet by mouth daily. Leg cramps       . telmisartan (MICARDIS) 20 MG tablet Take 20 mg by mouth daily.        Marland Kitchen  vitamin C (ASCORBIC ACID) 500 MG tablet Take 500 mg by mouth 2 (two) times daily.        . vitamin E 400 UNIT capsule Take 400 Units by mouth daily.        . zafirlukast (ACCOLATE) 20 MG tablet Take 20 mg by mouth 2 (two) times daily.          Assessment: 66 yo female with chest pain for Heparin. Level subtherapeutic this AM. Plan for cath in AM.   Goal of Therapy:  Heparin level 0.3-0.7 units/ml Monitor platelets by anticoagulation protocol: Yes   Plan:   Rebolus with 2500 units x1 Increase heparin to 1250 units/hr Check 6 hr level

## 2012-06-14 NOTE — Progress Notes (Signed)
Subjective:  Patient had chest heaviness yesterday at rest, and some during pm.  Now better.  No current pain.  Enzymes negative.    Objective:  Vital Signs in the last 24 hours: Temp:  [97.4 F (36.3 C)-98.3 F (36.8 C)] 97.4 F (36.3 C) (09/10 0550) Pulse Rate:  [65-89] 72  (09/10 0550) Resp:  [12-20] 18  (09/10 0550) BP: (122-139)/(62-81) 139/69 mmHg (09/10 0550) SpO2:  [94 %-100 %] 100 % (09/10 0550) Weight:  [174 lb 9.7 oz (79.2 kg)] 174 lb 9.7 oz (79.2 kg) (09/09 2243)  Intake/Output from previous day: 09/09 0701 - 09/10 0700 In: 48.3 [I.V.:48.3] Out: 1 [Urine:1]   Physical Exam: General: Well developed, well nourished, in no acute distress. Head:  Normocephalic and atraumatic. Lungs: Clear to auscultation and percussion. Heart: Normal S1 and S2.  2/6 SEM USLE Pulses: Pulses normal in all 4 extremities. Extremities: No clubbing or cyanosis. No edema. Neurologic: Alert and oriented x 3.    Lab Results:  [facility] 06/13/12 1829  WBC 6.6  HGB 13.0  PLT 242    Basename 06/13/12 1829  NA 136  K 4.2  CL 102  CO2 24  GLUCOSE 127*  BUN 15  CREATININE 0.64    Basename 06/13/12 2206 06/13/12 1830  TROPONINI <0.30 <0.30   Hepatic Function Panel  Basename 06/13/12 1829  PROT 7.1  ALBUMIN 3.8  AST 23  ALT 25  ALKPHOS 90  BILITOT 0.3  BILIDIR --  IBILI --   No results found for this basename: CHOL in the last 72 hours No results found for this basename: PROTIME in the last 72 hours  Imaging: Dg Chest 2 View  06/13/2012  *RADIOLOGY REPORT*  Clinical Data: Chest pain, history of breast carcinoma  CHEST - 2 VIEW  Comparison: CT chest of 07/16/2011 and chest x-ray of 02/02/2007  Findings: No active infiltrate or effusion is seen.  The heart is mildly enlarged and stable.  Surgical clips overlie the left axilla from prior axillary nodal dissection and mastectomy.  No bony abnormality is seen.  IMPRESSION: Stable chest x-ray.  No active lung disease.   Original  Report Authenticated By: Juline Patch, M.D.    Ct Angio Chest W/cm &/or Wo Cm  06/13/2012  *RADIOLOGY REPORT*  Clinical Data: Shortness of breath, some chest pain, also history of bilateral breast carcinoma  CT ANGIOGRAPHY CHEST  Technique:  Multidetector CT imaging of the chest using the standard protocol during bolus administration of intravenous contrast. Multiplanar reconstructed images including MIPs were obtained and reviewed to evaluate the vascular anatomy.  Contrast: 80mL OMNIPAQUE IOHEXOL 350 MG/ML SOLN  Comparison: Chest x-ray of 06/13/2012, and CT chest of 07/16/2011  Findings: The pulmonary arteries are relatively well opacified. There is no evidence of acute pulmonary embolism.  The thoracic aorta is not well opacified and cannot be evaluated.  There is mild cardiomegaly present.  No pericardial effusion is seen.  There are a few slightly prominent hilar and mediastinal nodes, none of which are pathologically enlarged.  On the lung window images there is patchy airspace disease bilaterally.  This may reflect atelectasis and/or obstructive pulmonary disease but no focal infiltrate or effusion is seen.  The central airway is patent.  Surgical clips overlie both axilla.  No bony abnormality is seen.  IMPRESSION:  1.  Negative CT chest for pulmonary embolism. 2.  Patchy airspace disease bilaterally which is nonspecific, possibly due to atelectasis and/or obstructive pulmonary disease. No focal infiltrate or effusion is  seen.   Original Report Authenticated By: Juline Patch, M.D.     EKG:  Anterior T wave changes.  Today is pending.   Cardiac Studies:  Enzymes are negative.    Assessment/Plan:  Patient Active Hospital Problem List: Unstable angina (06/13/2012)   Assessment: enzymes neg.  Repeat ECG pending.     Plan: I discussed with her various options.  Would favor cath for definition of anatomy.  She is agreeable after discussion of risks.  We will place on schedule for tomorrow am.  Check 2  D today. Start CBGs.   Reviewed with patient.       Shawnie Pons, MD, [facility], FSCAI 06/14/2012, 7:18 AM

## 2012-06-14 NOTE — Progress Notes (Signed)
ANTICOAGULATION CONSULT NOTE - Follow-up Consult  Pharmacy Consult for Heparin Indication: chest pain/ACS  Allergies  Allergen Reactions  . Adhesive (Tape) Other (See Comments)    Skin turns red   . Diphenhydramine Other (See Comments)    Pt feels wired   . Vicodin (Hydrocodone-Acetaminophen) Nausea And Vomiting    Patient Measurements: Height: 5\' 2"  (157.5 cm) Weight: 174 lb 9.7 oz (79.2 kg) IBW/kg (Calculated) : 50.1  Heparin Dosing Weight: 70 kg   Vital Signs: Temp: 98.2 F (36.8 C) (09/10 1700) Temp src: Oral (09/10 1700) BP: 131/80 mmHg (09/10 1700) Pulse Rate: 78  (09/10 1700)  Labs:  Basename 06/14/12 1528 06/14/12 1103 06/14/12 0701 06/13/12 2206 06/13/12 1829  HGB -- -- 12.8 -- 13.0  HCT -- -- 37.7 -- 38.0  PLT -- -- 218 -- 242  APTT -- -- -- -- --  LABPROT -- -- 14.8 -- 13.8  INR -- -- 1.14 -- 1.04  HEPARINUNFRC 0.32 -- <0.10* -- --  CREATININE -- -- 0.76 -- 0.64  CKTOTAL -- -- -- -- --  CKMB -- -- -- -- --  TROPONINI -- <0.30 <0.30 <0.30 --    Estimated Creatinine Clearance: 65.6 ml/min (by C-G formula based on Cr of 0.76).   Medical History: Past Medical History  Diagnosis Date  . Cancer     breast ca  . Asthma   . GERD (gastroesophageal reflux disease)   . Diabetes mellitus   . Arthritis   . Allergy history unknown   . Hiatal hernia   . Leg cramps   . Hypertension   . Elevated cholesterol   . DM w/o complication type II 10/30/2011    Medications:  Prescriptions prior to admission  Medication Sig Dispense Refill  . aspirin EC 81 MG tablet Take 81 mg by mouth daily.        Marland Kitchen atorvastatin (LIPITOR) 20 MG tablet Take 40 mg by mouth every evening.       . Calcium Carbonate-Vitamin D (CALCIUM-VITAMIN D) 500-200 MG-UNIT per tablet Take 1 tablet by mouth 2 (two) times daily with a meal.       . cetirizine (ZYRTEC) 10 MG tablet Take 10 mg by mouth every evening.      Marland Kitchen CINNAMON PO Take 1 tablet by mouth daily.        . Coenzyme Q10 (CO Q 10  PO) Take 1 capsule by mouth daily.        Marland Kitchen esomeprazole (NEXIUM) 20 MG capsule Take 20 mg by mouth daily before breakfast.        . Flaxseed, Linseed, (FLAX SEED OIL PO) Take 1 capsule by mouth daily.        . fluticasone (FLONASE) 50 MCG/ACT nasal spray Place 2 sprays into the nose 2 (two) times daily as needed. allergies       . Fluticasone-Salmeterol (ADVAIR) 250-50 MCG/DOSE AEPB Inhale 1 puff into the lungs every 12 (twelve) hours as needed. Wheezing        . Glucosamine-Chondroitin (GLUCOSAMINE CHONDR COMPLEX PO) Take 1 tablet by mouth daily.        . metFORMIN (GLUCOPHAGE-XR) 500 MG 24 hr tablet Take 500 mg by mouth every evening.       . Multiple Vitamins-Minerals (MULTIVITAMINS THER. W/MINERALS) TABS Take 1 tablet by mouth daily.        Marland Kitchen OVER THE COUNTER MEDICATION Take 1 tablet by mouth daily. Leg cramps       . telmisartan (MICARDIS) 20 MG tablet  Take 20 mg by mouth daily.        . vitamin C (ASCORBIC ACID) 500 MG tablet Take 500 mg by mouth 2 (two) times daily.        . vitamin E 400 UNIT capsule Take 400 Units by mouth daily.        . zafirlukast (ACCOLATE) 20 MG tablet Take 20 mg by mouth 2 (two) times daily.          Assessment: 66 yo female with chest pain for Heparin. Level now at low end of therapeutic goal on 1250 units/hr.  Plan for cath in AM.   Goal of Therapy:  Heparin level 0.3-0.7 units/ml Monitor platelets by anticoagulation protocol: Yes   Plan:   Increase heparin to 1350 units/hr to maintain goal range Follow-up with AM labs.  [employer], Pharm.D., BCPS Clinical Pharmacist Pager [phone] 06/14/2012 5:17 PM

## 2012-06-14 NOTE — Progress Notes (Signed)
Refused to watch PCI video.

## 2012-06-14 NOTE — Progress Notes (Signed)
Utilization Review Completed.  [name], [name]  06/14/2012

## 2012-06-14 NOTE — Progress Notes (Signed)
  Echocardiogram 2D Echocardiogram has been performed.  [name] 06/14/2012, 9:08 AM

## 2012-06-15 ENCOUNTER — Encounter (HOSPITAL_COMMUNITY)
Admission: EM | Disposition: A | Discharge status: Home / Self Care | Payer: Self-pay | Source: Home / Self Care | Attending: Cardiology

## 2012-06-15 DIAGNOSIS — R079 Chest pain, unspecified: Secondary | ICD-10-CM

## 2012-06-15 HISTORY — PX: LEFT HEART CATHETERIZATION WITH CORONARY ANGIOGRAM: SHX5451

## 2012-06-15 LAB — GLUCOSE, CAPILLARY
Glucose-Capillary: 105 mg/dL — ABNORMAL HIGH (ref 70–99)
Glucose-Capillary: 102 mg/dL — ABNORMAL HIGH (ref 70–99)

## 2012-06-15 LAB — CBC
HCT: 37.1 % (ref 36.0–46.0)
MCHC: 34 g/dL (ref 30.0–36.0)
RDW: 12.8 % (ref 11.5–15.5)

## 2012-06-15 LAB — HEPARIN LEVEL (UNFRACTIONATED): Heparin Unfractionated: 0.77 IU/mL — ABNORMAL HIGH (ref 0.30–0.70)

## 2012-06-15 SURGERY — LEFT HEART CATHETERIZATION WITH CORONARY ANGIOGRAM
Anesthesia: LOCAL

## 2012-06-15 MED ORDER — HEPARIN SODIUM (PORCINE) 1000 UNIT/ML IJ SOLN
INTRAMUSCULAR | Status: AC
  Filled 2012-06-15: qty 1

## 2012-06-15 MED ORDER — LIDOCAINE HCL (PF) 1 % IJ SOLN
INTRAMUSCULAR | Status: AC
  Filled 2012-06-15: qty 30

## 2012-06-15 MED ORDER — VERAPAMIL HCL 2.5 MG/ML IV SOLN
INTRAVENOUS | Status: AC
  Filled 2012-06-15: qty 2

## 2012-06-15 MED ORDER — FENTANYL CITRATE 0.05 MG/ML IJ SOLN
INTRAMUSCULAR | Status: AC
  Filled 2012-06-15: qty 2

## 2012-06-15 MED ORDER — HEPARIN (PORCINE) IN NACL 2-0.9 UNIT/ML-% IJ SOLN
INTRAMUSCULAR | Status: AC
  Filled 2012-06-15: qty 2000

## 2012-06-15 MED ORDER — METFORMIN HCL ER 500 MG PO TB24: 500.0000 mg | ORAL_TABLET | Freq: Every evening | ORAL | Status: DC

## 2012-06-15 MED ORDER — NITROGLYCERIN 0.2 MG/ML ON CALL CATH LAB
INTRAVENOUS | Status: AC
  Filled 2012-06-15: qty 1

## 2012-06-15 MED ORDER — SODIUM CHLORIDE 0.9 % IV SOLN: INTRAVENOUS | Status: AC

## 2012-06-15 MED ORDER — CARVEDILOL 3.125 MG PO TABS: 3.1250 mg | ORAL_TABLET | Freq: Two times a day (BID) | ORAL | Status: DC

## 2012-06-15 MED ORDER — MIDAZOLAM HCL 2 MG/2ML IJ SOLN
INTRAMUSCULAR | Status: AC
  Filled 2012-06-15: qty 2

## 2012-06-15 MED ORDER — ONDANSETRON HCL 4 MG/2ML IJ SOLN
INTRAMUSCULAR | Status: AC
  Filled 2012-06-15: qty 2

## 2012-06-15 NOTE — CV Procedure (Signed)
   Cardiac Catheterization Procedure Note  Name: SAOIRSE LEGERE MRN: [id number] DOB: 02/20/1946  Procedure: Left Heart Cath, Selective Coronary Angiography, LV angiography  Indication: Chest pain with left bundle branch block. Possible unstable angina.  Medications:  Sedation:  2 mg IV Versed, 50 mcg IV Fentanyl  Contrast:  90 mL Omnipaque   Procedural Details: The right wrist was prepped, draped, and anesthetized with 1% lidocaine. Using the modified Seldinger technique, a 5 French sheath was introduced into the right radial artery. 3 mg of verapamil was administered through the sheath, weight-based unfractionated heparin was administered intravenously. A JR 4 catheter was used to engage the right coronary artery. A JL 3.5 catheter could not engage the left main coronary artery. I used an AL-1 catheter which also did not work. A TIG 4 catheter was used for left coronary angiography. A pigtail catheter was used for left ventriculography. Catheter exchanges were performed over an exchange length guidewire. There were no immediate procedural complications. A TR band was used for radial hemostasis at the completion of the procedure.  The patient was transferred to the post catheterization recovery area for further monitoring.  Procedural Findings:  Hemodynamics: AO:  132/67   mmHg LV:  133/10    mmHg LVEDP: 15  mmHg  Coronary angiography: Coronary dominance: Right   Left Main:  Normal.  Left Anterior Descending (LAD):  Normal.  1st diagonal (D1):  Large in size and free of significant disease.  2nd diagonal (D2):  Small in size and normal.  3rd diagonal (D3):  Very small in size.  Circumflex (LCx):  Normal in size and nondominant. The vessel is free of any significant disease.  1st obtuse marginal:  Medium in size and free of significant disease.  2nd obtuse marginal:  Large in size with no significant disease.  3rd obtuse marginal:  Small in size.   Right Coronary Artery:  Normal in size and dominant. The vessel has no significant coronary artery disease.  posterior descending artery: Normal in size and free of significant disease.  posterior lateral branch:  2 normal posterior-lateral branchs.   Left ventriculography: Left ventricular systolic function is moderately reduced , LVEF is estimated at 35-40 %, there is no significant mitral regurgitation . Global hypokinesis.  Final Conclusions:   1. Normal coronary arteries. 2. Moderately reduced LV systolic function with an ejection fraction of 35-40%. Mildly elevated LVEDP.  Recommendations:  The patient has nonischemic cardiomyopathy with left bundle branch block. Continue treatment with an ARB. I will switch metoprolol to carvedilol. The patient can likely be discharged home later today with close followup in cardiology. She will need gradual up titration of carvedilol dose.  Lorine Bears MD, [facility] 06/15/2012, 9:18 AM

## 2012-06-15 NOTE — Interval H&P Note (Signed)
History and Physical Interval Note:  06/15/2012 8:35 AM  [name]  has presented today for surgery, with the diagnosis of Chest pain  The various methods of treatment have been discussed with the patient and family. After consideration of risks, benefits and other options for treatment, the patient has consented to  Procedure(s) (LRB) with comments: LEFT HEART CATHETERIZATION WITH CORONARY ANGIOGRAM (N/A) as a surgical intervention .  The patient's history has been reviewed, patient examined, no change in status, stable for surgery.  I have reviewed the patient's chart and labs.  Questions were answered to the patient's satisfaction.     Lorine Bears

## 2012-06-15 NOTE — Discharge Summary (Signed)
CARDIOLOGY DISCHARGE SUMMARY   Patient ID: [name] MRN: [id number] DOB/AGE: 1946/08/21 66 y.o.  Admit date: 06/13/2012 Discharge date: 06/15/2012  Primary Discharge Diagnosis:  Chest pain Secondary Discharge Diagnosis:  Past Medical History  Diagnosis Date  . Cancer     breast ca  . Asthma   . GERD (gastroesophageal reflux disease)   . Diabetes mellitus   . Arthritis   . Allergy history unknown   . Hiatal hernia   . Leg cramps   . Hypertension   . Elevated cholesterol   . DM w/o complication type II 10/30/2011   Procedures:Left Heart Cath, Selective Coronary Angiography, LV angiography  Hospital Course: [name] is a 66 year old female with no previous history of  CAD. Her ECG was noted to be abnormal and she was complaining of chest pressure. She was sent to the ER and was admitted for further evaluation and treatment.   Her cardiac enzymes were negative for MI. Her symptoms were concerning and she has multiple cardiac risk factors so she was taken to the cath lab for further evaluation and treatment. The results are below, but she had a non-ischemic cardiomyopathy, medical therapy recommended.   She is on a statin and an ARB. A low-dose of Coreg was added and can be up-titrated as an outpatient. A 2D echocardiogram was performed and showed a normal EF, but this will need to be repeated as an outpatient, in 3 months or so.  Post-cath, [name] is ambulating without chest pain or SOB and is considered stable for discharge, to follow up as an outpatient.  Labs:   Lab Results  Component Value Date   WBC 7.9 06/15/2012   HGB 12.6 06/15/2012   HCT 37.1 06/15/2012   MCV 92.5 06/15/2012   PLT 229 06/15/2012    Lab 06/14/12 0701 06/13/12 1829  NA 143 --  K 3.8 --  CL 106 --  CO2 28 --  BUN 17 --  CREATININE 0.76 --  CALCIUM 9.5 --  PROT -- 7.1  BILITOT -- 0.3  ALKPHOS -- 90  ALT -- 25  AST -- 23  GLUCOSE 100* --    Basename 06/14/12 1103 06/14/12 0701  06/13/12 2206  CKTOTAL -- -- --  CKMB -- -- --  CKMBINDEX -- -- --  TROPONINI <0.30 <0.30 <0.30   Lipid Panel     Component Value Date/Time   CHOL 145 06/14/2012 0701   TRIG 81 06/14/2012 0701   HDL 42 06/14/2012 0701   CHOLHDL 3.5 06/14/2012 0701   VLDL 16 06/14/2012 0701   LDLCALC 87 06/14/2012 0701    Pro B Natriuretic peptide (BNP)  Date/Time Value Range Status  06/13/2012  6:30 PM 155.2* 0 - 125 pg/mL Final    Basename 06/14/12 0701  INR 1.14      Radiology: Dg Chest 2 View  06/13/2012  *RADIOLOGY REPORT*  Clinical Data: Chest pain, history of breast carcinoma  CHEST - 2 VIEW  Comparison: CT chest of 07/16/2011 and chest x-ray of 02/02/2007  Findings: No active infiltrate or effusion is seen.  The heart is mildly enlarged and stable.  Surgical clips overlie the left axilla from prior axillary nodal dissection and mastectomy.  No bony abnormality is seen.  IMPRESSION: Stable chest x-ray.  No active lung disease.   Original Report Authenticated By: Juline Patch, M.D.    Ct Angio Chest W/cm &/or Wo Cm  06/13/2012  *RADIOLOGY REPORT*  Clinical Data: Shortness of breath,  some chest pain, also history of bilateral breast carcinoma  CT ANGIOGRAPHY CHEST  Technique:  Multidetector CT imaging of the chest using the standard protocol during bolus administration of intravenous contrast. Multiplanar reconstructed images including MIPs were obtained and reviewed to evaluate the vascular anatomy.  Contrast: 80mL OMNIPAQUE IOHEXOL 350 MG/ML SOLN  Comparison: Chest x-ray of 06/13/2012, and CT chest of 07/16/2011  Findings: The pulmonary arteries are relatively well opacified. There is no evidence of acute pulmonary embolism.  The thoracic aorta is not well opacified and cannot be evaluated.  There is mild cardiomegaly present.  No pericardial effusion is seen.  There are a few slightly prominent hilar and mediastinal nodes, none of which are pathologically enlarged.  On the lung window images there is  patchy airspace disease bilaterally.  This may reflect atelectasis and/or obstructive pulmonary disease but no focal infiltrate or effusion is seen.  The central airway is patent.  Surgical clips overlie both axilla.  No bony abnormality is seen.  IMPRESSION:  1.  Negative CT chest for pulmonary embolism. 2.  Patchy airspace disease bilaterally which is nonspecific, possibly due to atelectasis and/or obstructive pulmonary disease. No focal infiltrate or effusion is seen.   Original Report Authenticated By: Juline Patch, M.D.     Cardiac Cath: 06/15/2012 Left Main: Normal.  Left Anterior Descending (LAD): Normal.  1st diagonal (D1): Large in size and free of significant disease.  2nd diagonal (D2): Small in size and normal.  3rd diagonal (D3): Very small in size.  Circumflex (LCx): Normal in size and nondominant. The vessel is free of any significant disease.  1st obtuse marginal: Medium in size and free of significant disease.  2nd obtuse marginal: Large in size with no significant disease.  3rd obtuse marginal: Small in size.  Right Coronary Artery: Normal in size and dominant. The vessel has no significant coronary artery disease.  posterior descending artery: Normal in size and free of significant disease.  posterior lateral branch: 2 normal posterior-lateral branchs.  Left ventriculography: Left ventricular systolic function is moderately reduced , LVEF is estimated at 35-40 %, there is no significant mitral regurgitation . Global hypokinesis.  Final Conclusions:  1. Normal coronary arteries.  2. Moderately reduced LV systolic function with an ejection fraction of 35-40%. Mildly elevated LVEDP.  Recommendations:  The patient has nonischemic cardiomyopathy with left bundle branch block.   EKG: 06/13/2012 13-Jun-2012 18:25:43 [facility]/ED ROUTINE RECORD SINUS RHYTHM ~ normal P axis, V-rate 50- 99 EARLY PRECORDIAL R/S TRANSITION ~ QRS area positive in V2 LEFT VENTRICULAR  HYPERTROPHY ~ (SV1+RV5)>3.5/(RaVL+SV3)>2.80 ABNORMAL T, CONSIDER ISCHEMIA, ANTERIOR LEADS ~ T <-0.97mV, V2-V4 Standard 12 Lead Report ~ Unconfirmed Interpretation Abnormal ECG 54mm/s 104mm/mV 150Hz  8.0.1 12SL 235 CID: [id number] Referred by: Unconfirmed Vent. rate 77 BPM PR interval 168 Victoria QRS duration 108 Victoria QT/QTc 388/439 Victoria P-R-T axes 4 0 -15  Echo: 06/14/2012 Study Conclusions - Left ventricle: Poor endocardial definition Possible septal hypokinesis The cavity size was normal. Systolic function was normal. The estimated ejection fraction was in the range of 50% to 55%. Wall motion was normal; there were no regional wall motion abnormalities. - Left atrium: The atrium was mildly dilated. - Atrial septum: No defect or patent foramen ovale was identified.    FOLLOW UP PLANS AND APPOINTMENTS Allergies  Allergen Reactions  . Adhesive (Tape) Other (See Comments)    Skin turns red   . Diphenhydramine Other (See Comments)    Pt feels wired   .  Vicodin (Hydrocodone-Acetaminophen) Nausea And Vomiting     Medication List     As of 06/15/2012  2:16 PM    TAKE these medications         aspirin EC 81 MG tablet   Take 81 mg by mouth daily.      atorvastatin 20 MG tablet   Commonly known as: LIPITOR   Take 40 mg by mouth every evening.      calcium-vitamin D 500-200 MG-UNIT per tablet   Take 1 tablet by mouth 2 (two) times daily with a meal.      carvedilol 3.125 MG tablet   Commonly known as: COREG   Take 1 tablet (3.125 mg total) by mouth 2 (two) times daily with a meal.      cetirizine 10 MG tablet   Commonly known as: ZYRTEC   Take 10 mg by mouth every evening.      CINNAMON PO   Take 1 tablet by mouth daily.      CO Q 10 PO   Take 1 capsule by mouth daily.      esomeprazole 20 MG capsule   Commonly known as: NEXIUM   Take 20 mg by mouth daily before breakfast.      FLAX SEED OIL PO   Take 1 capsule by mouth daily.      fluticasone 50 MCG/ACT nasal spray    Commonly known as: FLONASE   Place 2 sprays into the nose 2 (two) times daily as needed. allergies      Fluticasone-Salmeterol 250-50 MCG/DOSE Aepb   Commonly known as: ADVAIR   Inhale 1 puff into the lungs every 12 (twelve) hours as needed. Wheezing        GLUCOSAMINE CHONDR COMPLEX PO   Take 1 tablet by mouth daily.      metFORMIN 500 MG 24 hr tablet   Commonly known as: GLUCOPHAGE-XR   Take 1 tablet (500 mg total) by mouth every evening. HOLD for 48 hours, restart on 06/18/2012.      multivitamins ther. w/minerals Tabs   Take 1 tablet by mouth daily.      OVER THE COUNTER MEDICATION   Take 1 tablet by mouth daily. Leg cramps      telmisartan 20 MG tablet   Commonly known as: MICARDIS   Take 20 mg by mouth daily.      vitamin C 500 MG tablet   Commonly known as: ASCORBIC ACID   Take 500 mg by mouth 2 (two) times daily.      vitamin E 400 UNIT capsule   Take 400 Units by mouth daily.      zafirlukast 20 MG tablet   Commonly known as: ACCOLATE   Take 20 mg by mouth 2 (two) times daily.            Discharge Orders    Future Appointments: Provider: Department: Dept Phone: Center:   06/29/2012 10:10 AM Beatrice Lecher, PA [facility] [phone] [facility]   10/21/2012 11:00 AM Delcie Roch [facility] [phone] None   10/28/2012 12:00 PM Levert Feinstein, MD [facility] [phone] None     Follow-up Information    Follow up with Tereso Newcomer, PA. On 06/29/2012. (at 10:10 am)    Contact information:   [address] [place] [address] [phone]          BRING ALL MEDICATIONS WITH YOU TO FOLLOW UP APPOINTMENTS  Time spent with patient to include physician  time: 32 min Signed: Theodore Demark 06/15/2012, 2:16 PM Co-Sign MD

## 2012-06-15 NOTE — Progress Notes (Signed)
ANTICOAGULATION CONSULT NOTE - Follow-up Consult  Pharmacy Consult for Heparin Indication: chest pain/ACS  Allergies  Allergen Reactions  . Adhesive (Tape) Other (See Comments)    Skin turns red   . Diphenhydramine Other (See Comments)    Pt feels wired   . Vicodin (Hydrocodone-Acetaminophen) Nausea And Vomiting    Patient Measurements: Height: 5\' 2"  (157.5 cm) Weight: 174 lb 9.7 oz (79.2 kg) IBW/kg (Calculated) : 50.1  Heparin Dosing Weight: 70 kg   Vital Signs: Temp: 97.5 F (36.4 C) (09/11 0537) Temp src: Oral (09/11 0537) BP: 161/103 mmHg (09/11 0806) Pulse Rate: 78  (09/11 0806)  Labs:  Basename 06/15/12 0420 06/14/12 1528 06/14/12 1103 06/14/12 0701 06/13/12 2206 06/13/12 1829  HGB 12.6 -- -- 12.8 -- --  HCT 37.1 -- -- 37.7 -- 38.0  PLT 229 -- -- 218 -- 242  APTT -- -- -- -- -- --  LABPROT -- -- -- 14.8 -- 13.8  INR -- -- -- 1.14 -- 1.04  HEPARINUNFRC 0.77* 0.32 -- <0.10* -- --  CREATININE -- -- -- 0.76 -- 0.64  CKTOTAL -- -- -- -- -- --  CKMB -- -- -- -- -- --  TROPONINI -- -- <0.30 <0.30 <0.30 --    Estimated Creatinine Clearance: 65.6 ml/min (by C-G formula based on Cr of 0.76).   Medical History: Past Medical History  Diagnosis Date  . Cancer     breast ca  . Asthma   . GERD (gastroesophageal reflux disease)   . Diabetes mellitus   . Arthritis   . Allergy history unknown   . Hiatal hernia   . Leg cramps   . Hypertension   . Elevated cholesterol   . DM w/o complication type II 10/30/2011    Medications:  Prescriptions prior to admission  Medication Sig Dispense Refill  . aspirin EC 81 MG tablet Take 81 mg by mouth daily.        Marland Kitchen atorvastatin (LIPITOR) 20 MG tablet Take 40 mg by mouth every evening.       . Calcium Carbonate-Vitamin D (CALCIUM-VITAMIN D) 500-200 MG-UNIT per tablet Take 1 tablet by mouth 2 (two) times daily with a meal.       . cetirizine (ZYRTEC) 10 MG tablet Take 10 mg by mouth every evening.      Marland Kitchen CINNAMON PO Take 1  tablet by mouth daily.        . Coenzyme Q10 (CO Q 10 PO) Take 1 capsule by mouth daily.        Marland Kitchen esomeprazole (NEXIUM) 20 MG capsule Take 20 mg by mouth daily before breakfast.        . Flaxseed, Linseed, (FLAX SEED OIL PO) Take 1 capsule by mouth daily.        . fluticasone (FLONASE) 50 MCG/ACT nasal spray Place 2 sprays into the nose 2 (two) times daily as needed. allergies       . Fluticasone-Salmeterol (ADVAIR) 250-50 MCG/DOSE AEPB Inhale 1 puff into the lungs every 12 (twelve) hours as needed. Wheezing        . Glucosamine-Chondroitin (GLUCOSAMINE CHONDR COMPLEX PO) Take 1 tablet by mouth daily.        . metFORMIN (GLUCOPHAGE-XR) 500 MG 24 hr tablet Take 500 mg by mouth every evening.       . Multiple Vitamins-Minerals (MULTIVITAMINS THER. W/MINERALS) TABS Take 1 tablet by mouth daily.        Marland Kitchen OVER THE COUNTER MEDICATION Take 1 tablet by mouth daily. Leg  cramps       . telmisartan (MICARDIS) 20 MG tablet Take 20 mg by mouth daily.        . vitamin C (ASCORBIC ACID) 500 MG tablet Take 500 mg by mouth 2 (two) times daily.        . vitamin E 400 UNIT capsule Take 400 Units by mouth daily.        . zafirlukast (ACCOLATE) 20 MG tablet Take 20 mg by mouth 2 (two) times daily.          Assessment: 66 yo female with chest pain for Heparin. Heparin level supratherapeutic this AM. No complication.  At cath this AM. Will f/u after cath.  Goal of Therapy:  Heparin level 0.3-0.7 units/ml Monitor platelets by anticoagulation protocol: Yes   Plan:   F/u after cath

## 2012-06-15 NOTE — Progress Notes (Signed)
To cardiac cath lab via stretcher accompanied by nurse on ccm.

## 2012-06-15 NOTE — H&P (View-Only) (Signed)
Subjective:  Patient had chest heaviness yesterday at rest, and some during pm.  Now better.  No current pain.  Enzymes negative.    Objective:  Vital Signs in the last 24 hours: Temp:  [97.4 F (36.3 C)-98.3 F (36.8 C)] 97.4 F (36.3 C) (09/10 0550) Pulse Rate:  [65-89] 72  (09/10 0550) Resp:  [12-20] 18  (09/10 0550) BP: (122-139)/(62-81) 139/69 mmHg (09/10 0550) SpO2:  [94 %-100 %] 100 % (09/10 0550) Weight:  [174 lb 9.7 oz (79.2 kg)] 174 lb 9.7 oz (79.2 kg) (09/09 2243)  Intake/Output from previous day: 09/09 0701 - 09/10 0700 In: 48.3 [I.V.:48.3] Out: 1 [Urine:1]   Physical Exam: General: Well developed, well nourished, in no acute distress. Head:  Normocephalic and atraumatic. Lungs: Clear to auscultation and percussion. Heart: Normal S1 and S2.  2/6 SEM USLE Pulses: Pulses normal in all 4 extremities. Extremities: No clubbing or cyanosis. No edema. Neurologic: Alert and oriented x 3.    Lab Results:  Basename 06/13/12 1829  WBC 6.6  HGB 13.0  PLT 242    Basename 06/13/12 1829  NA 136  K 4.2  CL 102  CO2 24  GLUCOSE 127*  BUN 15  CREATININE 0.64    Basename 06/13/12 2206 06/13/12 1830  TROPONINI <0.30 <0.30   Hepatic Function Panel  Basename 06/13/12 1829  PROT 7.1  ALBUMIN 3.8  AST 23  ALT 25  ALKPHOS 90  BILITOT 0.3  BILIDIR --  IBILI --   No results found for this basename: CHOL in the last 72 hours No results found for this basename: PROTIME in the last 72 hours  Imaging: Dg Chest 2 View  06/13/2012  *RADIOLOGY REPORT*  Clinical Data: Chest pain, history of breast carcinoma  CHEST - 2 VIEW  Comparison: CT chest of 07/16/2011 and chest x-ray of 02/02/2007  Findings: No active infiltrate or effusion is seen.  The heart is mildly enlarged and stable.  Surgical clips overlie the left axilla from prior axillary nodal dissection and mastectomy.  No bony abnormality is seen.  IMPRESSION: Stable chest x-ray.  No active lung disease.   Original  Report Authenticated By: PAUL D. BARRY, M.D.    Ct Angio Chest W/cm &/or Wo Cm  06/13/2012  *RADIOLOGY REPORT*  Clinical Data: Shortness of breath, some chest pain, also history of bilateral breast carcinoma  CT ANGIOGRAPHY CHEST  Technique:  Multidetector CT imaging of the chest using the standard protocol during bolus administration of intravenous contrast. Multiplanar reconstructed images including MIPs were obtained and reviewed to evaluate the vascular anatomy.  Contrast: 80mL OMNIPAQUE IOHEXOL 350 MG/ML SOLN  Comparison: Chest x-ray of 06/13/2012, and CT chest of 07/16/2011  Findings: The pulmonary arteries are relatively well opacified. There is no evidence of acute pulmonary embolism.  The thoracic aorta is not well opacified and cannot be evaluated.  There is mild cardiomegaly present.  No pericardial effusion is seen.  There are a few slightly prominent hilar and mediastinal nodes, none of which are pathologically enlarged.  On the lung window images there is patchy airspace disease bilaterally.  This may reflect atelectasis and/or obstructive pulmonary disease but no focal infiltrate or effusion is seen.  The central airway is patent.  Surgical clips overlie both axilla.  No bony abnormality is seen.  IMPRESSION:  1.  Negative CT chest for pulmonary embolism. 2.  Patchy airspace disease bilaterally which is nonspecific, possibly due to atelectasis and/or obstructive pulmonary disease. No focal infiltrate or effusion is   seen.   Original Report Authenticated By: PAUL D. BARRY, M.D.     EKG:  Anterior T wave changes.  Today is pending.   Cardiac Studies:  Enzymes are negative.    Assessment/Plan:  Patient Active Hospital Problem List: Unstable angina (06/13/2012)   Assessment: enzymes neg.  Repeat ECG pending.     Plan: I discussed with her various options.  Would favor cath for definition of anatomy.  She is agreeable after discussion of risks.  We will place on schedule for tomorrow am.  Check 2  D today. Start CBGs.   Reviewed with patient.       Uday Jantz, MD, FACC, FSCAI 06/14/2012, 7:18 AM    

## 2012-06-17 ENCOUNTER — Telehealth: Payer: Self-pay | Admitting: *Deleted

## 2012-06-17 NOTE — Telephone Encounter (Signed)
Pt. Called.  She has noticed a small red spot on the end of her mastectomy scar.  It has been there about a month.  It is the size of a dime.  Has not gotten any smaller or bigger.  It is not painful and there is no drainage..  It is were her bra hits it.  She just got out of the hospital with heart issues and states her heart function is at 35-40%. Discussed with Lonna Cobb NP and instructed patient to see her surgeon.  States both of her surgeons are retired.  Let her know that she is still a patient in that practice and she can call them for an appt. To be seen.  She will call them and appreciated the direction in this situation.

## 2012-06-20 DIAGNOSIS — Z23 Encounter for immunization: Secondary | ICD-10-CM | POA: Diagnosis not present

## 2012-06-21 ENCOUNTER — Telehealth: Payer: Self-pay | Admitting: Cardiology

## 2012-06-21 NOTE — Telephone Encounter (Signed)
Dr. Elease Hashimoto DOD Recommended for pt to increase Carvedilol to 6.25 mg twice. Pt aware of recommendations. Pt is aware to call the office if any concerns. Pt also is aware of  appointment with Tereso Newcomer on 06/29/12.

## 2012-06-21 NOTE — Telephone Encounter (Signed)
Pt is on carvedilol and one day she took a extra dose by mistake and the day she did that she felt fine and now she took her regular dose and she is having chest discomfort should her dose be increased or what.

## 2012-06-21 NOTE — Telephone Encounter (Signed)
Patient had a cardiac cath  last Wednesday. Pt was started on Carvedilol 3.125 mg twice a day. On Saturday pt took a second carvedilol 3.125 mg in Am by mistake. Pt  took the regular dose in the evening. The morning second pill made her feel better for two days. Today pt C/O of having the discomfort across the chest like before the went to the ER and had THE CATH. PT WOULD LIKE TO KNOW IF SHE CAN TAKE THE SECOND DOSE Of CARVEDILOL in Am.

## 2012-06-24 DIAGNOSIS — R072 Precordial pain: Secondary | ICD-10-CM | POA: Diagnosis not present

## 2012-06-29 ENCOUNTER — Ambulatory Visit (INDEPENDENT_AMBULATORY_CARE_PROVIDER_SITE_OTHER): Payer: Medicare Other | Admitting: Physician Assistant

## 2012-06-29 ENCOUNTER — Encounter: Payer: Self-pay | Admitting: Physician Assistant

## 2012-06-29 VITALS — BP 114/78 | HR 82 | Ht 62.0 in | Wt 181.6 lb

## 2012-06-29 DIAGNOSIS — R0602 Shortness of breath: Secondary | ICD-10-CM | POA: Diagnosis not present

## 2012-06-29 DIAGNOSIS — R079 Chest pain, unspecified: Secondary | ICD-10-CM

## 2012-06-29 DIAGNOSIS — I429 Cardiomyopathy, unspecified: Secondary | ICD-10-CM | POA: Diagnosis not present

## 2012-06-29 LAB — BASIC METABOLIC PANEL
BUN: 17 mg/dL (ref 6–23)
Calcium: 9.2 mg/dL (ref 8.4–10.5)
Creatinine, Ser: 0.7 mg/dL (ref 0.4–1.2)
GFR: 88.4 mL/min (ref >60.00)

## 2012-06-29 MED ORDER — FUROSEMIDE 20 MG PO TABS: 20.0000 mg | ORAL_TABLET | Freq: Every day | ORAL | Status: DC

## 2012-06-29 MED ORDER — POTASSIUM CHLORIDE ER 10 MEQ PO TBCR: 10.0000 meq | EXTENDED_RELEASE_TABLET | Freq: Every day | ORAL | Status: DC

## 2012-06-29 MED ORDER — CARVEDILOL 6.25 MG PO TABS: 6.2500 mg | ORAL_TABLET | Freq: Two times a day (BID) | ORAL | Status: DC

## 2012-06-29 MED ORDER — NITROGLYCERIN 0.4 MG SL SUBL: 0.4000 mg | SUBLINGUAL_TABLET | SUBLINGUAL | Status: DC | PRN

## 2012-06-29 NOTE — Progress Notes (Signed)
[address] [place], [place]  [address] Phone: [phone] Fax:  [phone]  Date:  06/29/2012   Name:  [name]   DOB:  07/19/1946   MRN:  [id number]  PCP:  Redmond Baseman, MD  Primary Cardiologist:  Dr.  Shawnie Pons  Primary Electrophysiologist:  None    History of Present Illness: [name] is a 66 y.o. female who returns for post hospital follow up.  She has a history of DM 2, HTN, HL, breast cancer, GERD. She was admitted 9/9-9/11 with chest pain consistent with unstable angina. She ruled out for myocardial infarction by enzymes. Chest CT 06/13/12: No pulmonary embolism, patchy air space disease that is nonspecific. Echo 06/14/12: Poor endocardial definition, possible septal HK, EF 50-55%, normal wall motion, mild LAE. LHC 06/15/12: Normal coronary arteries, EF 35-40%, mildly elevated LVEDP. She was diagnosed with a nonischemic cardiomyopathy.    Since discharge from the hospital, she continues to have occasional chest heaviness. This seems to be mainly associated with meals. She is also troubled by significant dyspnea at exertion. She describes class IIb-3 symptoms. She denies orthopnea, PND or edema. She denies syncope. She tells me that she took meloxicam for about 25 days back in June prior to a lot of her symptoms starting. Some days are better than others. She did call in recently with continued chest discomfort and was told to increase her Coreg to 6.25 mg twice a day.  Labs (9/13):  K 3.8, creatinine 0.76, ALT 25, proBNP 155.2, LDL 87, Hgb 12.6, A1C 6.3, TSH 4.884  Wt Readings from Last 3 Encounters:  06/13/12 174 lb 9.7 oz (79.2 kg)  06/13/12 174 lb 9.7 oz (79.2 kg)  10/30/11 175 lb 12.8 oz (79.742 kg)     Past Medical History  Diagnosis Date  . Cancer     breast ca  . Asthma   . GERD (gastroesophageal reflux disease)   . DM2 (diabetes mellitus, type 2)   . Arthritis   . Allergy history unknown   . Hiatal hernia   . Leg  cramps   . Hypertension   . Elevated cholesterol   . NICM (nonischemic cardiomyopathy)     a. Echo:  06/14/12: Poor endocardial definition, possible septal HK, EF 50-55%, normal wall motion, mild LAE ;  b.  [facility] 9/13:  normal cors, EF 35-40%,     Current Outpatient Prescriptions  Medication Sig Dispense Refill  . aspirin EC 81 MG tablet Take 81 mg by mouth daily.        Marland Kitchen atorvastatin (LIPITOR) 20 MG tablet Take 40 mg by mouth every evening.       . Calcium Carbonate-Vitamin D (CALCIUM-VITAMIN D) 500-200 MG-UNIT per tablet Take 1 tablet by mouth 2 (two) times daily with a meal.       . carvedilol (COREG) 3.125 MG tablet Take 1 tablet (3.125 mg total) by mouth 2 (two) times daily with a meal.  60 tablet  11  . cetirizine (ZYRTEC) 10 MG tablet Take 10 mg by mouth every evening.      Marland Kitchen CINNAMON PO Take 1 tablet by mouth daily.        . Coenzyme Q10 (CO Q 10 PO) Take 1 capsule by mouth daily.        Marland Kitchen esomeprazole (NEXIUM) 20 MG capsule Take 20 mg by mouth daily before breakfast.        . Flaxseed, Linseed, (FLAX SEED OIL PO) Take 1 capsule by  mouth daily.        . fluticasone (FLONASE) 50 MCG/ACT nasal spray Place 2 sprays into the nose 2 (two) times daily as needed. allergies       . Fluticasone-Salmeterol (ADVAIR) 250-50 MCG/DOSE AEPB Inhale 1 puff into the lungs every 12 (twelve) hours as needed. Wheezing        . Glucosamine-Chondroitin (GLUCOSAMINE CHONDR COMPLEX PO) Take 1 tablet by mouth daily.        . metFORMIN (GLUCOPHAGE-XR) 500 MG 24 hr tablet Take 1 tablet (500 mg total) by mouth every evening. HOLD for 48 hours, restart on 06/18/2012.  30 tablet  11  . Multiple Vitamins-Minerals (MULTIVITAMINS THER. W/MINERALS) TABS Take 1 tablet by mouth daily.        Marland Kitchen OVER THE COUNTER MEDICATION Take 1 tablet by mouth daily. Leg cramps       . telmisartan (MICARDIS) 20 MG tablet Take 20 mg by mouth daily.        . vitamin C (ASCORBIC ACID) 500 MG tablet Take 500 mg by mouth 2 (two) times daily.         . vitamin E 400 UNIT capsule Take 400 Units by mouth daily.        . zafirlukast (ACCOLATE) 20 MG tablet Take 20 mg by mouth 2 (two) times daily.          Allergies: Allergies  Allergen Reactions  . Adhesive (Tape) Other (See Comments)    Skin turns red   . Diphenhydramine Other (See Comments)    Pt feels wired   . Vicodin (Hydrocodone-Acetaminophen) Nausea And Vomiting    History  Substance Use Topics  . Smoking status: Never Smoker   . Smokeless tobacco: Never Used  . Alcohol Use: No     ROS:  Please see the history of present illness.   No fevers or chills. She has a mild cough that is mainly nonproductive. She denies melena, hematochezia, hematemesis, vomiting or diarrhea.  All other systems reviewed and negative.   PHYSICAL EXAM: VS:  BP 114/78  Pulse 82  Ht 5\' 2"  (1.575 m)  Wt 181 lb 9.6 oz (82.373 kg)  BMI 33.21 kg/m2 Well nourished, well developed, in no acute distress HEENT: normal Neck: no JVD Cardiac:  normal S1, S2; RRR; no murmur Lungs:  clear to auscultation bilaterally, no wheezing, rhonchi or rales Abd: soft, nontender, no hepatomegaly Ext: no edema; right wrist without hematoma or bruit  Skin: warm and dry Neuro:  CNs 2-12 intact, no focal abnormalities noted  EKG:  NSR, HR 82, normal axis, interventricular conduction delay, poor R wave progression, no change from prior tracing.      ASSESSMENT AND PLAN:  1. Chest Pain: She had normal coronary arteries on cardiac catheterization.  She was recently placed on Carafate in addition to Nexium at twice a day dosing. Her symptoms had improved somewhat with this. Given her history of taking meloxicam for an extended period of time, I suspect her chest discomfort is likely GI in nature. She has followup planned with her PCP. She may need to see gastroenterology. She does have diabetes. She could have microvascular ischemia. However, this was not evident on her cardiac catheterization. I will give her  prescription for when necessary nitroglycerin to see if this helps. We can always consider adding isosorbide or amlodipine in the future. She will followup as noted below.  2. Shortness of Breath:  She had a mildly elevated LVEDP on her cardiac catheterization. Her ejection  fraction is also depressed. She does not particularly look volume overloaded on exam. However, I will give her a trial of low-dose Lasix to see if this improves her symptoms. She'll take Lasix 20 mg daily along with potassium 10 mEq daily for one week. I will check a basic metabolic panel and BNP today. Repeat a basic metabolic panel in one week. She can followup with me or Dr. Riley Kill in one to 2 weeks.  3. Nonischemic Cardiomyopathy: Ejection fraction was depressed on cardiac catheterization. However, on echocardiogram it was normal. She will likely need reassessment of her ejection fraction in 3-6 months. Question if we should consider MRI. Continue current dose of beta blocker and ARB. Consider adding spironolactone in the future.  4. Asthma: This may be playing a role in her symptoms given her worsening acid reflux.  5. GERD: She is on proton pump inhibitor therapy along with Carafate. She has followup planned with her PCP. As noted she may need to see gastroenterology.  Luna Glasgow, PA-C  9:54 AM 06/29/2012

## 2012-06-29 NOTE — Patient Instructions (Addendum)
Your physician has recommended you make the following change in your medication:  1.)  REFILLED YOUR COREG ONE TABLET ( 6.25 MG) TWICE A DAY 2.) START LASIX ONE TABLET (20 MG) DAILY FOR 7 DAYS 3.)  START POTASSIUM (10 MEQ) DAILY FOR 7 DAYS 4.) START NITROGLYCERIN AS NEEDED FOR CHEST PAIN   Your physician recommends that you HAVE lab work TODAY: BMET/BNP    Your physician recommends that you return for lab work in: ONE WEEK: BMET  Your physician recommends that you schedule a follow-up appointment in: 1-2 WEEKS WITH SCOTT WEAVER, PA-C WHEN DR. Riley Kill IS IN THE OFFICE

## 2012-06-30 ENCOUNTER — Telehealth: Payer: Self-pay | Admitting: *Deleted

## 2012-06-30 NOTE — Telephone Encounter (Signed)
lmom labs ok 

## 2012-06-30 NOTE — Telephone Encounter (Signed)
Message copied by Tarri Fuller on Thu Jun 30, 2012 10:16 AM ------      Message from: [place], [place] T      Created: Thu Jun 30, 2012  8:23 AM       Renal fxn and K+ ok.      Tereso Newcomer, PA-C  8:23 AM 06/30/2012

## 2012-07-06 ENCOUNTER — Other Ambulatory Visit (INDEPENDENT_AMBULATORY_CARE_PROVIDER_SITE_OTHER): Payer: Medicare Other

## 2012-07-06 DIAGNOSIS — R0602 Shortness of breath: Secondary | ICD-10-CM | POA: Diagnosis not present

## 2012-07-06 DIAGNOSIS — R079 Chest pain, unspecified: Secondary | ICD-10-CM

## 2012-07-06 DIAGNOSIS — I429 Cardiomyopathy, unspecified: Secondary | ICD-10-CM | POA: Diagnosis not present

## 2012-07-06 LAB — BASIC METABOLIC PANEL
Calcium: 9 mg/dL (ref 8.4–10.5)
GFR: 80.39 mL/min (ref >60.00)
Glucose, Bld: 117 mg/dL — ABNORMAL HIGH (ref 70–99)
Potassium: 3.6 mEq/L (ref 3.5–5.1)
Sodium: 141 mEq/L (ref 135–145)

## 2012-07-07 ENCOUNTER — Telehealth: Payer: Self-pay | Admitting: *Deleted

## 2012-07-07 NOTE — Telephone Encounter (Signed)
lmom labs ok 

## 2012-07-07 NOTE — Telephone Encounter (Signed)
Message copied by Tarri Fuller on Thu Jul 07, 2012 12:29 PM ------      Message from: [place], [place] T      Created: Wed Jul 06, 2012  4:55 PM       [facility]      Continue with current treatment plan.      Tereso Newcomer, PA-C  4:55 PM 07/06/2012

## 2012-07-19 ENCOUNTER — Ambulatory Visit (INDEPENDENT_AMBULATORY_CARE_PROVIDER_SITE_OTHER): Payer: Medicare Other | Admitting: Cardiology

## 2012-07-19 ENCOUNTER — Encounter: Payer: Self-pay | Admitting: Cardiology

## 2012-07-19 VITALS — BP 132/68 | HR 83 | Ht 62.0 in | Wt 181.0 lb

## 2012-07-19 DIAGNOSIS — I10 Essential (primary) hypertension: Secondary | ICD-10-CM | POA: Diagnosis not present

## 2012-07-19 DIAGNOSIS — I200 Unstable angina: Secondary | ICD-10-CM

## 2012-07-19 DIAGNOSIS — R079 Chest pain, unspecified: Secondary | ICD-10-CM

## 2012-07-19 NOTE — Patient Instructions (Addendum)
Your physician recommends that you schedule a follow-up appointment in: 2 MONTHS with Dr Shirlee Latch  Your physician has requested that you have an echocardiogram in 2 MONTHS. Echocardiography is a painless test that uses sound waves to create images of your heart. It provides your doctor with information about the size and shape of your heart and how well your heart's chambers and valves are working. This procedure takes approximately one hour. There are no restrictions for this procedure.  Your physician recommends that you continue on your current medications as directed. Please refer to the Current Medication list given to you today.  You have been referred to Dr Matthias Hughs for further evaluation of non-cardiac Chest Pain

## 2012-07-20 ENCOUNTER — Other Ambulatory Visit: Payer: Self-pay | Admitting: Gastroenterology

## 2012-07-20 DIAGNOSIS — R079 Chest pain, unspecified: Secondary | ICD-10-CM | POA: Diagnosis not present

## 2012-07-20 DIAGNOSIS — K219 Gastro-esophageal reflux disease without esophagitis: Secondary | ICD-10-CM | POA: Diagnosis not present

## 2012-07-20 DIAGNOSIS — R1013 Epigastric pain: Secondary | ICD-10-CM

## 2012-07-20 DIAGNOSIS — Z800 Family history of malignant neoplasm of digestive organs: Secondary | ICD-10-CM | POA: Diagnosis not present

## 2012-07-20 NOTE — Assessment & Plan Note (Signed)
See assessment.

## 2012-07-20 NOTE — Assessment & Plan Note (Signed)
On meds at present.  Will continue and repeat echo.

## 2012-07-20 NOTE — Progress Notes (Signed)
HPI:  The patient comes in for follow up.  She was admitted by her primary MD and we saw her in consult.  She had some chest discomfort.  She thinks it was coming from the some medication she was taking.  She is feeling some better.  She had LBBB, then some anterior T change.  She underwent cardiac cath with the following findings:  Coronary angiography:  Coronary dominance: Right  Left Main: Normal.  Left Anterior Descending (LAD): Normal.  1st diagonal (D1): Large in size and free of significant disease.  2nd diagonal (D2): Small in size and normal.  3rd diagonal (D3): Very small in size.  Circumflex (LCx): Normal in size and nondominant. The vessel is free of any significant disease.  1st obtuse marginal: Medium in size and free of significant disease.  2nd obtuse marginal: Large in size with no significant disease.  3rd obtuse marginal: Small in size.  Right Coronary Artery: Normal in size and dominant. The vessel has no significant coronary artery disease.  posterior descending artery: Normal in size and free of significant disease.  posterior lateral branch: 2 normal posterior-lateral branchs.  Left ventriculography: Left ventricular systolic function is moderately reduced , LVEF is estimated at 35-40 %, there is no significant mitral regurgitation . Global hypokinesis.  Final Conclusions:  1. Normal coronary arteries.  2. Moderately reduced LV systolic function with an ejection fraction of 35-40%. Mildly elevated LVEDP.  Recommendations:  The patient has nonischemic cardiomyopathy with left bundle branch block. Continue treatment with an ARB. I will switch metoprolol to carvedilol. The patient can likely be discharged home later today with close followup in cardiology. She will need gradual up titration of carvedilol dose.  Her echo was read slightly differently  Study Conclusions  - Left ventricle: Poor endocardial definition Possible septal hypokinesis The cavity size was  normal. Systolic function was normal. The estimated ejection fraction was in the range of 50% to 55%. Wall motion was normal; there were no regional wall motion abnormalities. - Left atrium: The atrium was mildly dilated. - Atrial septum: No defect or patent foramen ovale was identified.  Of note, her d dimer was also normal.  So no evidence of MI or PE.     Current Outpatient Prescriptions  Medication Sig Dispense Refill  . aspirin EC 81 MG tablet Take 81 mg by mouth daily.        Marland Kitchen atorvastatin (LIPITOR) 20 MG tablet Take 40 mg by mouth every evening.       . Calcium Carbonate-Vitamin D (CALCIUM-VITAMIN D) 500-200 MG-UNIT per tablet Take 1 tablet by mouth 2 (two) times daily with a meal.       . carvedilol (COREG) 6.25 MG tablet Take 1 tablet (6.25 mg total) by mouth 2 (two) times daily with a meal.  60 tablet  9  . cetirizine (ZYRTEC) 10 MG tablet Take 10 mg by mouth every evening.      Marland Kitchen CINNAMON PO Take 1 tablet by mouth daily.        . Coenzyme Q10 (CO Q 10 PO) Take 1 capsule by mouth daily.        Marland Kitchen esomeprazole (NEXIUM) 20 MG capsule Take 20 mg by mouth 2 (two) times daily.       . Flaxseed, Linseed, (FLAX SEED OIL PO) Take 1 capsule by mouth daily.        . fluticasone (FLONASE) 50 MCG/ACT nasal spray Place 2 sprays into the nose 2 (two) times daily  as needed. allergies       . Fluticasone-Salmeterol (ADVAIR) 250-50 MCG/DOSE AEPB Inhale 1 puff into the lungs every 12 (twelve) hours as needed. Wheezing        . Glucosamine-Chondroitin (GLUCOSAMINE CHONDR COMPLEX PO) Take 1 tablet by mouth daily.        . metFORMIN (GLUCOPHAGE-XR) 500 MG 24 hr tablet Take 500 mg by mouth every evening.      . Multiple Vitamins-Minerals (MULTIVITAMINS THER. W/MINERALS) TABS Take 1 tablet by mouth daily.        . nitroGLYCERIN (NITROSTAT) 0.4 MG SL tablet Place 1 tablet (0.4 mg total) under the tongue every 5 (five) minutes as needed for chest pain.  25 tablet  3  . OVER THE COUNTER MEDICATION Take  1 tablet by mouth daily. Leg cramps       . sucralfate (CARAFATE) 1 G tablet Take 1 g by mouth 4 (four) times daily.      Marland Kitchen telmisartan (MICARDIS) 20 MG tablet Take 20 mg by mouth daily.        . vitamin C (ASCORBIC ACID) 500 MG tablet Take 500 mg by mouth 2 (two) times daily.        . vitamin E 400 UNIT capsule Take 400 Units by mouth daily.        . zafirlukast (ACCOLATE) 20 MG tablet Take 20 mg by mouth 2 (two) times daily.          Allergies  Allergen Reactions  . Adhesive (Tape) Other (See Comments)    Skin turns red   . Diphenhydramine Other (See Comments)    Pt feels wired   . Vicodin (Hydrocodone-Acetaminophen) Nausea And Vomiting    Past Medical History  Diagnosis Date  . Cancer     breast ca  . Asthma   . GERD (gastroesophageal reflux disease)   . DM2 (diabetes mellitus, type 2)   . Arthritis   . Allergy history unknown   . Hiatal hernia   . Leg cramps   . Hypertension   . Elevated cholesterol   . NICM (nonischemic cardiomyopathy)     a. Echo:  06/14/12: Poor endocardial definition, possible septal HK, EF 50-55%, normal wall motion, mild LAE ;  b.  [facility] 9/13:  normal cors, EF 35-40%,     Past Surgical History  Procedure Date  . Mastectomy     bilateral  . Knee surgery     left  . Dilation and curettage of uterus     No family history on file.  History   Social History  . Marital Status: Married    Spouse Name: N/A    Number of Children: N/A  . Years of Education: N/A   Occupational History  . Not on file.   Social History Main Topics  . Smoking status: Never Smoker   . Smokeless tobacco: Never Used  . Alcohol Use: No  . Drug Use: No  . Sexually Active: No   Other Topics Concern  . Not on file   Social History Narrative  . No narrative on file    ROS: Please see the HPI.  All other systems reviewed and negative.  PHYSICAL EXAM:  BP 132/68  Pulse 83  Ht 5\' 2"  (1.575 m)  Wt 181 lb (82.101 kg)  BMI 33.11 kg/m2  SpO2 97%  General:  Well developed, well nourished, in no acute distress. Head:  Normocephalic and atraumatic. Neck: no JVD Lungs: Clear to auscultation and percussion. Heart: Normal S1  and S2.  No murmur, rubs or gallops.  Abdomen:  Normal bowel sounds; soft; non tender; no organomegaly Pulses: Pulses normal in all 4 extremities. Extremities: No clubbing or cyanosis. No edema. Neurologic: Alert and oriented x 3.  EKG:  NSR.  Nonspecific T changes.  Rare PVC.    ASSESSMENT AND PLAN:  1.  Chest Pain  The etiology of this is unclear.  ? Related to meloxicam and upper GI issues.  She had normal coronaries.  Some discrepancy in cath and echo data, but coronaries are normal.  Would be worthwhile having her see Dr. Clent Ridges who she has seen in the past  ? Any benefit of upper endo? Will repeat echo, continue meds, and see her in early follow up with a repeat echocardiogram.

## 2012-07-21 DIAGNOSIS — E119 Type 2 diabetes mellitus without complications: Secondary | ICD-10-CM | POA: Diagnosis not present

## 2012-07-21 DIAGNOSIS — I10 Essential (primary) hypertension: Secondary | ICD-10-CM | POA: Diagnosis not present

## 2012-07-26 DIAGNOSIS — R0789 Other chest pain: Secondary | ICD-10-CM | POA: Diagnosis not present

## 2012-07-26 DIAGNOSIS — K449 Diaphragmatic hernia without obstruction or gangrene: Secondary | ICD-10-CM | POA: Diagnosis not present

## 2012-07-26 DIAGNOSIS — K219 Gastro-esophageal reflux disease without esophagitis: Secondary | ICD-10-CM | POA: Diagnosis not present

## 2012-07-28 ENCOUNTER — Ambulatory Visit
Admission: RE | Admit: 2012-07-28 | Discharge: 2012-07-28 | Disposition: A | Discharge status: Home / Self Care | Payer: Medicare Other | Source: Ambulatory Visit | Attending: Gastroenterology | Admitting: Gastroenterology

## 2012-07-28 DIAGNOSIS — Z853 Personal history of malignant neoplasm of breast: Secondary | ICD-10-CM | POA: Diagnosis not present

## 2012-07-28 DIAGNOSIS — R1013 Epigastric pain: Secondary | ICD-10-CM

## 2012-07-28 DIAGNOSIS — R110 Nausea: Secondary | ICD-10-CM | POA: Diagnosis not present

## 2012-08-22 DIAGNOSIS — R079 Chest pain, unspecified: Secondary | ICD-10-CM | POA: Diagnosis not present

## 2012-08-29 DIAGNOSIS — R197 Diarrhea, unspecified: Secondary | ICD-10-CM | POA: Diagnosis not present

## 2012-09-21 ENCOUNTER — Ambulatory Visit (HOSPITAL_COMMUNITY): Payer: Medicare Other | Attending: Cardiology | Admitting: Radiology

## 2012-09-21 ENCOUNTER — Ambulatory Visit (INDEPENDENT_AMBULATORY_CARE_PROVIDER_SITE_OTHER): Payer: Medicare Other | Admitting: Cardiology

## 2012-09-21 ENCOUNTER — Encounter: Payer: Self-pay | Admitting: Cardiology

## 2012-09-21 VITALS — BP 141/75 | HR 75 | Ht 62.0 in | Wt 177.0 lb

## 2012-09-21 DIAGNOSIS — R079 Chest pain, unspecified: Secondary | ICD-10-CM | POA: Diagnosis not present

## 2012-09-21 DIAGNOSIS — I429 Cardiomyopathy, unspecified: Secondary | ICD-10-CM

## 2012-09-21 DIAGNOSIS — I428 Other cardiomyopathies: Secondary | ICD-10-CM | POA: Diagnosis not present

## 2012-09-21 DIAGNOSIS — I10 Essential (primary) hypertension: Secondary | ICD-10-CM | POA: Insufficient documentation

## 2012-09-21 DIAGNOSIS — E785 Hyperlipidemia, unspecified: Secondary | ICD-10-CM | POA: Diagnosis not present

## 2012-09-21 DIAGNOSIS — I5022 Chronic systolic (congestive) heart failure: Secondary | ICD-10-CM | POA: Diagnosis not present

## 2012-09-21 DIAGNOSIS — I517 Cardiomegaly: Secondary | ICD-10-CM | POA: Diagnosis not present

## 2012-09-21 DIAGNOSIS — I447 Left bundle-branch block, unspecified: Secondary | ICD-10-CM | POA: Insufficient documentation

## 2012-09-21 DIAGNOSIS — R0602 Shortness of breath: Secondary | ICD-10-CM | POA: Diagnosis not present

## 2012-09-21 DIAGNOSIS — E119 Type 2 diabetes mellitus without complications: Secondary | ICD-10-CM | POA: Diagnosis not present

## 2012-09-21 DIAGNOSIS — I200 Unstable angina: Secondary | ICD-10-CM

## 2012-09-21 DIAGNOSIS — R0609 Other forms of dyspnea: Secondary | ICD-10-CM | POA: Diagnosis not present

## 2012-09-21 DIAGNOSIS — R0989 Other specified symptoms and signs involving the circulatory and respiratory systems: Secondary | ICD-10-CM | POA: Insufficient documentation

## 2012-09-21 MED ORDER — VALSARTAN 160 MG PO TABS: ORAL_TABLET | ORAL | Status: DC

## 2012-09-21 NOTE — Patient Instructions (Addendum)
Stop micardis.   Start valsartan 240mg  daily. This will be 1 and 1/2 of your 160mg  tablets daily.   Your physician recommends that you return for lab work in: about 2 weeks--BMET/BNP/TSH/Free T4/Free T3/ANA/ serum immunofixation/urine immunofixation.   Your physician has requested that you have a cardiac MRI. Cardiac MRI uses a computer to create images of your heart as its beating, producing both still and moving pictures of your heart and major blood vessels. For further information please visit [email or web address]. Please follow the instruction sheet given to you today for more information.  Your physician recommends that you schedule a follow-up appointment in: 3 months with Dr Shirlee Latch.

## 2012-09-21 NOTE — Progress Notes (Signed)
Patient ID: [name], female   DOB: January 20, 1946, 66 y.o.   MRN: [id number] PCP: Dr. Modesto Charon  66 yo with history of HTN and type II diabetes presents for evaluation of nonischemic cardiomyopathy.  Back in 2009, she had an echo with normal EF.  She was admitted in 9/13 with chest pain and LBBB.  She had LHC showing no CAD but EF 35-40%.  Echo was a technically difficult study with EF reported as 50-55%.  She has been taking ARB and Coreg at home.  Repeat echo today showed EF 40% with septal hypokinesis.  At last appointment, she had an ECG that did not show a LBBB, suggesting that the LBBB was transient.    She has been feeling good in general.  Actually, considerably better than prior to admission.  She had had exertional dyspnea prior to admission.  Now she can do all her normal activities without shortness of breath and rides and exercise bike with no problems.  No further significant chest pain.  No orthopnea/PND.  She had an EGD in 11/13 showing no ulcer.  She is mainly limited by arthritis pain in the knee and hip.    Labs (9/13): TSH 4.884 (elevated), proBNP 155 Labs (10/13): K 3.6, creatinine 0.8  PMH: 1. Transient LBBB: most recent ECG in our office does not show LBBB.  2. Breast cancer s/p bilateral mastectomy. 3. GERD 4. Type II diabetes mellitus 5. HTN 6. Asthma 7. PE in 2001 8. Hyperlipidemia 9. Nonischemic Cardiomyopathy: Echo 8/09 with 60%.  Admitted in 9/13 with chest pain.  LHC showed no angiographic CAD and EF 35-40%.  Echo at that time was read as showing EF 50-55% but was a technically difficult study.  Echo (12/13): EF 40% with septal hypokinesis, normal RV size and systolic function, no significant valvular abnormalities.   SH: Married, lives in [facility], never smoked.  FH: Uncle with ? SCD.  Brother with ? SCD.    ROS: All systems reviewed and negative except as per HPI.   Current Outpatient Prescriptions  Medication Sig Dispense Refill  . aspirin EC  81 MG tablet Take 81 mg by mouth daily.        Marland Kitchen atorvastatin (LIPITOR) 20 MG tablet Take 40 mg by mouth every evening.       . Calcium Carbonate-Vitamin D (CALCIUM-VITAMIN D) 500-200 MG-UNIT per tablet Take 1 tablet by mouth 2 (two) times daily with a meal.       . carvedilol (COREG) 6.25 MG tablet Take 1 tablet (6.25 mg total) by mouth 2 (two) times daily with a meal.  60 tablet  9  . cetirizine (ZYRTEC) 10 MG tablet Take 10 mg by mouth every evening.      Marland Kitchen CINNAMON PO Take 1 tablet by mouth daily.        . Coenzyme Q10 (CO Q 10 PO) Take 1 capsule by mouth daily.        Marland Kitchen esomeprazole (NEXIUM) 20 MG capsule Take 20 mg by mouth 2 (two) times daily.       . Flaxseed, Linseed, (FLAX SEED OIL PO) Take 1 capsule by mouth daily.        . fluticasone (FLONASE) 50 MCG/ACT nasal spray Place 2 sprays into the nose 2 (two) times daily as needed. allergies       . Fluticasone-Salmeterol (ADVAIR) 250-50 MCG/DOSE AEPB Inhale 1 puff into the lungs every 12 (twelve) hours as needed. Wheezing        .  Glucosamine-Chondroitin (GLUCOSAMINE CHONDR COMPLEX PO) Take 1 tablet by mouth daily.        . metFORMIN (GLUCOPHAGE-XR) 500 MG 24 hr tablet Take 500 mg by mouth every evening.      . Multiple Vitamins-Minerals (MULTIVITAMINS THER. W/MINERALS) TABS Take 1 tablet by mouth daily.        . nitroGLYCERIN (NITROSTAT) 0.4 MG SL tablet Place 1 tablet (0.4 mg total) under the tongue every 5 (five) minutes as needed for chest pain.  25 tablet  3  . OVER THE COUNTER MEDICATION Take 1 tablet by mouth daily. Leg cramps       . sucralfate (CARAFATE) 1 G tablet Take 1 g by mouth 4 (four) times daily.      . vitamin C (ASCORBIC ACID) 500 MG tablet Take 500 mg by mouth 2 (two) times daily.        . vitamin E 400 UNIT capsule Take 400 Units by mouth daily.        . zafirlukast (ACCOLATE) 20 MG tablet Take 20 mg by mouth 2 (two) times daily.        . valsartan (DIOVAN) 160 MG tablet 1 and 1/2 tablets (total 240mg ) daily  45  tablet  6    BP 141/75  Pulse 75  Ht 5\' 2"  (1.575 m)  Wt 177 lb (80.287 kg)  BMI 32.37 kg/m2 General: NAD Neck: No JVD, no thyromegaly or thyroid nodule.  Lungs: Clear to auscultation bilaterally with normal respiratory effort. CV: Nondisplaced PMI.  Heart regular S1/S2, no S3/S4, no murmur.  No peripheral edema.  No carotid bruit.  Normal pedal pulses.  Abdomen: Soft, nontender, no hepatosplenomegaly, no distention.  Neurologic: Alert and oriented x 3.  Psych: Normal affect. Extremities: No clubbing or cyanosis.   Assessment/Plan: 1. Nonischemic cardiomyopathy: NYHA class I-II symptoms, not volume overloaded on exam.  No CAD on recent cath.  EF 40% with septal hypokinesis on today's echo.  Had apparent transient LBBB so hard to explain cardiomyopathy as a LBBB-related cardiomyopathy.  No viral illness that she remembers prior to 9/13 admission.  EF was normal in 2009 so doubt this was related to breast cancer treatment.  BNP was mildly elevated in 9/13.  Interestingly, she has a possible family history of sudden cardiac death in her brother and her uncle.   - Repeat TSH, free T4 and free T3 given elevated TSH recently (though mildly).  - Check ANA, serum/urine immunofixation.  - Continue Coreg.  Replace telmisartan with valsartan 240 mg daily (ARB that is more studied in heart failure).  BMET in 2 wks.  - Cardiac MRI to assess for evidence of infiltrative disease or myocarditis.  - Followup in 3 months.  2. Hyperlipidemia: Continue statin given risk factors (diabetes, HTN).    Marca Ancona 09/21/2012

## 2012-09-21 NOTE — Progress Notes (Signed)
Echocardiogram performed.  

## 2012-10-06 ENCOUNTER — Encounter: Payer: Self-pay | Admitting: Cardiology

## 2012-10-06 ENCOUNTER — Other Ambulatory Visit (INDEPENDENT_AMBULATORY_CARE_PROVIDER_SITE_OTHER): Payer: Medicare Other

## 2012-10-06 DIAGNOSIS — R0602 Shortness of breath: Secondary | ICD-10-CM

## 2012-10-06 DIAGNOSIS — I429 Cardiomyopathy, unspecified: Secondary | ICD-10-CM | POA: Diagnosis not present

## 2012-10-06 DIAGNOSIS — I5022 Chronic systolic (congestive) heart failure: Secondary | ICD-10-CM | POA: Diagnosis not present

## 2012-10-06 DIAGNOSIS — I10 Essential (primary) hypertension: Secondary | ICD-10-CM

## 2012-10-06 DIAGNOSIS — R079 Chest pain, unspecified: Secondary | ICD-10-CM | POA: Diagnosis not present

## 2012-10-06 LAB — BASIC METABOLIC PANEL
BUN: 17 mg/dL (ref 6–23)
CO2: 25 mEq/L (ref 19–32)
Calcium: 9 mg/dL (ref 8.4–10.5)
Chloride: 106 mEq/L (ref 96–112)
Creatinine, Ser: 0.8 mg/dL (ref 0.4–1.2)
Glucose, Bld: 138 mg/dL — ABNORMAL HIGH (ref 70–99)

## 2012-10-06 LAB — T3, FREE: T3, Free: 2.5 pg/mL (ref 2.3–4.2)

## 2012-10-06 LAB — TSH: TSH: 1.34 u[IU]/mL (ref 0.35–5.50)

## 2012-10-07 LAB — ANTI-NUCLEAR AB-TITER (ANA TITER): ANA Titer 1: 1:40 {titer} — ABNORMAL HIGH

## 2012-10-11 LAB — IMMUNOFIXATION ELECTROPHORESIS: Total Protein, Serum Electrophoresis: 6.7 g/dL (ref 6.0–8.3)

## 2012-10-12 ENCOUNTER — Ambulatory Visit (HOSPITAL_COMMUNITY)
Admission: RE | Admit: 2012-10-12 | Discharge: 2012-10-12 | Disposition: A | Discharge status: Home / Self Care | Payer: Medicare Other | Source: Ambulatory Visit | Attending: Cardiology | Admitting: Cardiology

## 2012-10-12 DIAGNOSIS — I5022 Chronic systolic (congestive) heart failure: Secondary | ICD-10-CM

## 2012-10-12 DIAGNOSIS — I428 Other cardiomyopathies: Secondary | ICD-10-CM | POA: Diagnosis not present

## 2012-10-12 MED ORDER — GADOBENATE DIMEGLUMINE 529 MG/ML IV SOLN
25.0000 mL | Freq: Once | INTRAVENOUS | Status: AC
  Administered 2012-10-12: 25 mL via INTRAVENOUS

## 2012-10-13 DIAGNOSIS — M999 Biomechanical lesion, unspecified: Secondary | ICD-10-CM | POA: Diagnosis not present

## 2012-10-13 DIAGNOSIS — M25559 Pain in unspecified hip: Secondary | ICD-10-CM | POA: Diagnosis not present

## 2012-10-17 DIAGNOSIS — M999 Biomechanical lesion, unspecified: Secondary | ICD-10-CM | POA: Diagnosis not present

## 2012-10-17 DIAGNOSIS — M25559 Pain in unspecified hip: Secondary | ICD-10-CM | POA: Diagnosis not present

## 2012-10-18 DIAGNOSIS — K648 Other hemorrhoids: Secondary | ICD-10-CM | POA: Diagnosis not present

## 2012-10-18 DIAGNOSIS — Z1211 Encounter for screening for malignant neoplasm of colon: Secondary | ICD-10-CM | POA: Diagnosis not present

## 2012-10-18 DIAGNOSIS — Z800 Family history of malignant neoplasm of digestive organs: Secondary | ICD-10-CM | POA: Diagnosis not present

## 2012-10-19 DIAGNOSIS — I10 Essential (primary) hypertension: Secondary | ICD-10-CM | POA: Diagnosis not present

## 2012-10-19 DIAGNOSIS — E119 Type 2 diabetes mellitus without complications: Secondary | ICD-10-CM | POA: Diagnosis not present

## 2012-10-19 DIAGNOSIS — M999 Biomechanical lesion, unspecified: Secondary | ICD-10-CM | POA: Diagnosis not present

## 2012-10-19 DIAGNOSIS — M25559 Pain in unspecified hip: Secondary | ICD-10-CM | POA: Diagnosis not present

## 2012-10-20 ENCOUNTER — Telehealth: Payer: Self-pay | Admitting: Oncology

## 2012-10-20 DIAGNOSIS — M25559 Pain in unspecified hip: Secondary | ICD-10-CM | POA: Diagnosis not present

## 2012-10-20 DIAGNOSIS — M999 Biomechanical lesion, unspecified: Secondary | ICD-10-CM | POA: Diagnosis not present

## 2012-10-20 NOTE — Telephone Encounter (Signed)
Talked to patient and gave her new appt date r/s from 1/24 due to MD's call day, there is no availability on 11/11/12

## 2012-10-21 ENCOUNTER — Other Ambulatory Visit (HOSPITAL_BASED_OUTPATIENT_CLINIC_OR_DEPARTMENT_OTHER): Payer: Medicare Other | Admitting: Lab

## 2012-10-21 DIAGNOSIS — C50919 Malignant neoplasm of unspecified site of unspecified female breast: Secondary | ICD-10-CM

## 2012-10-21 DIAGNOSIS — E119 Type 2 diabetes mellitus without complications: Secondary | ICD-10-CM | POA: Diagnosis not present

## 2012-10-21 LAB — COMPREHENSIVE METABOLIC PANEL (CC13)
Albumin: 3.6 g/dL (ref 3.5–5.0)
CO2: 24 mEq/L (ref 22–29)
Calcium: 9.3 mg/dL (ref 8.4–10.4)
Chloride: 107 mEq/L (ref 98–107)
Glucose: 117 mg/dl — ABNORMAL HIGH (ref 70–99)
Potassium: 3.7 mEq/L (ref 3.5–5.1)
Sodium: 141 mEq/L (ref 136–145)
Total Protein: 7 g/dL (ref 6.4–8.3)

## 2012-10-21 LAB — CBC WITH DIFFERENTIAL/PLATELET
Basophils Absolute: 0 10*3/uL (ref 0.0–0.1)
Eosinophils Absolute: 0.2 10*3/uL (ref 0.0–0.5)
HGB: 12.9 g/dL (ref 11.6–15.9)
MONO#: 0.4 10*3/uL (ref 0.1–0.9)
NEUT#: 3.1 10*3/uL (ref 1.5–6.5)
RBC: 4.03 10*6/uL (ref 3.70–5.45)
RDW: 12.8 % (ref 11.2–14.5)
WBC: 6 10*3/uL (ref 3.9–10.3)

## 2012-10-21 LAB — LACTATE DEHYDROGENASE (CC13): LDH: 166 U/L (ref 125–245)

## 2012-10-24 DIAGNOSIS — M999 Biomechanical lesion, unspecified: Secondary | ICD-10-CM | POA: Diagnosis not present

## 2012-10-24 DIAGNOSIS — M25559 Pain in unspecified hip: Secondary | ICD-10-CM | POA: Diagnosis not present

## 2012-10-25 ENCOUNTER — Telehealth: Payer: Self-pay | Admitting: Oncology

## 2012-10-25 ENCOUNTER — Ambulatory Visit (HOSPITAL_BASED_OUTPATIENT_CLINIC_OR_DEPARTMENT_OTHER): Payer: Medicare Other | Admitting: Oncology

## 2012-10-25 VITALS — BP 121/66 | HR 90 | Temp 98.1°F | Resp 20 | Ht 62.0 in | Wt 179.2 lb

## 2012-10-25 DIAGNOSIS — Z853 Personal history of malignant neoplasm of breast: Secondary | ICD-10-CM

## 2012-10-25 DIAGNOSIS — I447 Left bundle-branch block, unspecified: Secondary | ICD-10-CM

## 2012-10-25 DIAGNOSIS — M999 Biomechanical lesion, unspecified: Secondary | ICD-10-CM | POA: Diagnosis not present

## 2012-10-25 DIAGNOSIS — E119 Type 2 diabetes mellitus without complications: Secondary | ICD-10-CM

## 2012-10-25 DIAGNOSIS — C50911 Malignant neoplasm of unspecified site of right female breast: Secondary | ICD-10-CM

## 2012-10-25 DIAGNOSIS — M25559 Pain in unspecified hip: Secondary | ICD-10-CM | POA: Diagnosis not present

## 2012-10-25 DIAGNOSIS — C50919 Malignant neoplasm of unspecified site of unspecified female breast: Secondary | ICD-10-CM

## 2012-10-25 DIAGNOSIS — C50912 Malignant neoplasm of unspecified site of left female breast: Secondary | ICD-10-CM

## 2012-10-25 NOTE — Telephone Encounter (Signed)
Gave pt appt for [facility] on 05/23/13 , mammogram then see MD on January 2015 with labs

## 2012-10-26 DIAGNOSIS — H2510 Age-related nuclear cataract, unspecified eye: Secondary | ICD-10-CM | POA: Diagnosis not present

## 2012-10-26 DIAGNOSIS — H538 Other visual disturbances: Secondary | ICD-10-CM | POA: Diagnosis not present

## 2012-10-26 DIAGNOSIS — H524 Presbyopia: Secondary | ICD-10-CM | POA: Diagnosis not present

## 2012-10-26 DIAGNOSIS — E119 Type 2 diabetes mellitus without complications: Secondary | ICD-10-CM | POA: Diagnosis not present

## 2012-10-26 DIAGNOSIS — H02839 Dermatochalasis of unspecified eye, unspecified eyelid: Secondary | ICD-10-CM | POA: Diagnosis not present

## 2012-10-27 ENCOUNTER — Encounter: Payer: Self-pay | Admitting: Oncology

## 2012-10-27 DIAGNOSIS — C50911 Malignant neoplasm of unspecified site of right female breast: Secondary | ICD-10-CM

## 2012-10-27 DIAGNOSIS — I447 Left bundle-branch block, unspecified: Secondary | ICD-10-CM | POA: Insufficient documentation

## 2012-10-27 DIAGNOSIS — M999 Biomechanical lesion, unspecified: Secondary | ICD-10-CM | POA: Diagnosis not present

## 2012-10-27 DIAGNOSIS — M25559 Pain in unspecified hip: Secondary | ICD-10-CM | POA: Diagnosis not present

## 2012-10-27 DIAGNOSIS — C50912 Malignant neoplasm of unspecified site of left female breast: Secondary | ICD-10-CM

## 2012-10-27 HISTORY — DX: Malignant neoplasm of unspecified site of left female breast: C50.912

## 2012-10-27 HISTORY — DX: Left bundle-branch block, unspecified: I44.7

## 2012-10-27 HISTORY — DX: Malignant neoplasm of unspecified site of right female breast: C50.911

## 2012-10-27 NOTE — Progress Notes (Signed)
Hematology and Oncology Follow Up Visit  [name] [id number] 1946-10-05 67 y.o. 10/27/2012 11:52 AM   Principle Diagnosis: Encounter Diagnosis  Name Primary?  . Breast cancer, stage 2 Yes     Interim History:   Followup visit for this pleasant 67 year old woman with a history of eetachronous primary bilateral breast cancers.  Initial stage II 1.5 cm ER negative one node positive cancer left breast diagnosed October 1999 treated with mastectomy with immediate TRAM reconstruction followed by 4 cycles of Adriamycin Cytoxan chemotherapy then 5 years of tamoxifen hormonal therapy.  Second primary 3.2 cm ER PR positive HER-2 negative one node positive cancer of the right breast diagnosed in April 2008 status post right mastectomy 02/04/2007 followed by 4 cycles of carboplatinum plus Taxotere chemotherapy given between may 28th and 05/04/2007. Initial Arimidex hormonal therapy changed to Aromasin do to poor tolerance of Arimidex. She completed 5 years of Arimidex in July 2013  We have been following a small, left, supraclavicular lymph node now for a few years. It has been biopsied twice on 12/08/2007 and again on 08/17/2011 with no malignant diagnosis obtained. She has had followup CT scans of her neck in the area remains unchanged. Most recent CT neck done one month prior to most recent biopsy on 07/16/2011. Most recent mammogram done 05/18/2012 on the left breast showed no new disease. She had previous right mastectomy.  Since last visit with me, she has had a GI evaluation for atypical chest pain. Upper endoscopy done 07/26/2012 and colonoscopy done 10/18/2012 unrevealing. She underwent a cardiac catheterization procedure prior to the GI evaluation on 06/15/2012. She had a left bundle branch block pattern on EKG and some anterior T wave changes. The catheterization showed normal coronary arteries with a moderate reduction in left ventricular systolic function estimated ejection fraction  35-40%. Findings were felt to be consistent with a non-ischemic cardiomyopathy and associated left bundle-branch block. She was started on an ARB. (Valsartan). She has no chest symptoms at this time. A recent cardiac MRI estimated ejection fraction at 50% with mild, global, hypokinesis of the left ventricle but normal size and function of the ventricle. .       Medications: reviewed  Allergies:  Allergies  Allergen Reactions  . Adhesive (Tape) Other (See Comments)    Skin turns red   . Diphenhydramine Other (See Comments)    Pt feels wired   . Vicodin (Hydrocodone-Acetaminophen) Nausea And Vomiting    Review of Systems: Constitutional:   No constitutional symptoms Respiratory: No cough or dyspnea Cardiovascular: No chest pain or palpitations  Gastrointestinal: No abdominal pain or change in bowel habit Genito-Urinary: No vaginal bleeding Musculoskeletal: No muscle, bone, or joint pain except for arthritis of her knees Neurologic: No headache or change in vision Skin: No rash or ecchymosis Remaining ROS negative.  Physical Exam: Blood pressure 121/66, pulse 90, temperature 98.1 F (36.7 C), temperature source Oral, resp. rate 20, height 5\' 2"  (1.575 m), weight 179 lb 3.2 oz (81.285 kg). Wt Readings from Last 3 Encounters:  10/25/12 179 lb 3.2 oz (81.285 kg)  09/21/12 177 lb (80.287 kg)  07/19/12 181 lb (82.101 kg)     General appearance: Well-nourished Caucasian woman HENNT: Pharynx no erythema or exudate Lymph nodes: Persistent 0.5 cm left supraclavicular lymph node unchanged from multiple prior exams. No other areas of adenopathy in the neck, right supraclavicular region, or axillae Breasts: Right mastectomy, no chest wall lesions, left breast TRAM reconstruction. Lungs: Clear to auscultation resonant to  percussion Heart: Regular rhythm no murmur Abdomen: Soft, nontender, no mass, no organomegaly Extremities: No edema, no calf tenderness Vascular: No  cyanosis Neurologic: No focal deficit Skin: No rash  Lab Results: Lab Results  Component Value Date   WBC 6.0 10/21/2012   HGB 12.9 10/21/2012   HCT 37.5 10/21/2012   MCV 93.0 10/21/2012   PLT 243 10/21/2012     Chemistry      Component Value Date/Time   NA 141 10/21/2012 1045   NA 139 10/06/2012 0930   K 3.7 10/21/2012 1045   K 3.6 10/06/2012 0930   CL 107 10/21/2012 1045   CL 106 10/06/2012 0930   CO2 24 10/21/2012 1045   CO2 25 10/06/2012 0930   BUN 14.0 10/21/2012 1045   BUN 17 10/06/2012 0930   CREATININE 0.8 10/21/2012 1045   CREATININE 0.8 10/06/2012 0930      Component Value Date/Time   CALCIUM 9.3 10/21/2012 1045   CALCIUM 9.0 10/06/2012 0930   ALKPHOS 91 10/21/2012 1045   ALKPHOS 90 06/13/2012 1829   AST 14 10/21/2012 1045   AST 23 06/13/2012 1829   ALT 24 10/21/2012 1045   ALT 25 06/13/2012 1829   BILITOT 0.37 10/21/2012 1045   BILITOT 0.3 06/13/2012 1829       Radiological Studies: Mr Card Morphology Wo/w Cm  10/12/2012  *RADIOLOGY REPORT*  Clinical Data: Cardiomyopathy  MR CARDIA MORPHOLOGY WITHOUT AND WITH CONTRAST  GE 1.5 T magnet with dedicated cardiac coil.  FIESTA sequences for function and morphology.  10 minutes after 25 mL Multihance constrast was injected, inversion recovery sequences were done to assess for myocardial delayed enhancement.  EF was calculated at a dedicated workstation.  Contrast: 25mL MULTIHANCE GADOBENATE DIMEGLUMINE 529 MG/ML IV SOLN  Comparison: None.  Findings: Normal left ventricular size and wall thickness.  EF 50% with mild global hypokinesis.  Normal right ventricular size and systolic function.  Mild left atrial enlargement.  Normal right atrial size.  Trileaflet aortic valve without significant stenosis or regurgitation.  No significant mitral regurgitation noted.  On delayed enhancement imaging, there was no myocardial delayed enhancement.  Measurements:  LV EDV 144 mL  LV SV 72 mL  LV EF 50%  IMPRESSION: 1. Normal left ventricular size and systolic function,  EF 50%. There was mild global hypokinesis.  2. No myocardial delayed enhancement, so no definitive evidence for infiltrative disease, prior MI, or prior myocarditis.   Original Report Authenticated By: Marca Ancona     Impression and Plan: #1. Metachronous bilateral primary breast cancers treated as outlined above. No evidence for new disease now 15 years from the right breast cancer and almost 6 years from diagnosis of the left breast cancer.  #2. Persistent, presumably benign, subcentimeter left supraclavicular lymph node with negative biopsy x2 and stable on current clinical exam.  #3. Isolated episode of atypical chest pain with normal coronary arteries and normal GI evaluation.  #4. Asymptomatic left bundle branch block  #5. Type 2 diabetes on oral agent  #6. Degenerative arthritis   CC:.    Levert Feinstein, MD 1/23/201411:52 AM

## 2012-10-28 ENCOUNTER — Ambulatory Visit: Payer: Medicare Other | Admitting: Oncology

## 2012-10-31 DIAGNOSIS — J45909 Unspecified asthma, uncomplicated: Secondary | ICD-10-CM | POA: Diagnosis not present

## 2012-11-01 DIAGNOSIS — M999 Biomechanical lesion, unspecified: Secondary | ICD-10-CM | POA: Diagnosis not present

## 2012-11-01 DIAGNOSIS — M25559 Pain in unspecified hip: Secondary | ICD-10-CM | POA: Diagnosis not present

## 2012-11-03 DIAGNOSIS — M999 Biomechanical lesion, unspecified: Secondary | ICD-10-CM | POA: Diagnosis not present

## 2012-11-03 DIAGNOSIS — M25559 Pain in unspecified hip: Secondary | ICD-10-CM | POA: Diagnosis not present

## 2012-11-07 ENCOUNTER — Ambulatory Visit (INDEPENDENT_AMBULATORY_CARE_PROVIDER_SITE_OTHER)
Admission: RE | Admit: 2012-11-07 | Discharge: 2012-11-07 | Disposition: A | Discharge status: Home / Self Care | Payer: Medicare Other | Source: Ambulatory Visit | Attending: Internal Medicine | Admitting: Internal Medicine

## 2012-11-07 ENCOUNTER — Institutional Professional Consult (permissible substitution): Payer: Medicare Other | Admitting: Pulmonary Disease

## 2012-11-07 ENCOUNTER — Ambulatory Visit (INDEPENDENT_AMBULATORY_CARE_PROVIDER_SITE_OTHER): Payer: Medicare Other | Admitting: Internal Medicine

## 2012-11-07 ENCOUNTER — Encounter: Payer: Self-pay | Admitting: Internal Medicine

## 2012-11-07 VITALS — BP 110/78 | HR 82 | Temp 99.0°F | Ht 62.0 in | Wt 183.4 lb

## 2012-11-07 DIAGNOSIS — R05 Cough: Secondary | ICD-10-CM

## 2012-11-07 DIAGNOSIS — J45909 Unspecified asthma, uncomplicated: Secondary | ICD-10-CM | POA: Diagnosis not present

## 2012-11-07 DIAGNOSIS — I10 Essential (primary) hypertension: Secondary | ICD-10-CM | POA: Diagnosis not present

## 2012-11-07 DIAGNOSIS — R0602 Shortness of breath: Secondary | ICD-10-CM | POA: Diagnosis not present

## 2012-11-07 DIAGNOSIS — M999 Biomechanical lesion, unspecified: Secondary | ICD-10-CM | POA: Diagnosis not present

## 2012-11-07 DIAGNOSIS — M25559 Pain in unspecified hip: Secondary | ICD-10-CM | POA: Diagnosis not present

## 2012-11-07 MED ORDER — TRAMADOL HCL 50 MG PO TABS: ORAL_TABLET | ORAL | Status: DC

## 2012-11-07 MED ORDER — NEBIVOLOL HCL 10 MG PO TABS: 10.0000 mg | ORAL_TABLET | Freq: Every day | ORAL | Status: DC

## 2012-11-07 MED ORDER — PREDNISONE (PAK) 10 MG PO TABS: ORAL_TABLET | ORAL | Status: DC

## 2012-11-07 MED ORDER — MOMETASONE FURO-FORMOTEROL FUM 100-5 MCG/ACT IN AERO: INHALATION_SPRAY | RESPIRATORY_TRACT | Status: DC

## 2012-11-07 NOTE — Assessment & Plan Note (Signed)
Symptoms are markedly disproportionate to objective findings and not clear this is a lung problem but pt does appear to have difficult airway management issues. DDX of  difficult airways managment all start with A and  include Adherence, Ace Inhibitors, Acid Reflux, Active Sinus Disease, Alpha 1 Antitripsin deficiency, Anxiety masquerading as Airways dz,  ABPA,  allergy(esp in young), Aspiration (esp in elderly), Adverse effects of DPI,  Active smokers, plus two Bs  = Bronchiectasis and Beta blocker use..and one C= CHF   Adherence is always the initial "prime suspect" and is a multilayered concern that requires a "trust but verify" approach in every patient - starting with knowing how to use medications, especially inhalers, correctly, keeping up with refills and understanding the fundamental difference between maintenance and prns vs those medications only taken for a very short course and then stopped and not refilled. The proper method of use, as well as anticipated side effects, of a metered-dose inhaler are discussed and demonstrated to the patient. Improved effectiveness after extensive coaching during this visit to a level of approximately  75% so change to dulera 100 2 bid  ? Acid reflux > max rx  ? Allergy > continue accolate  ? Beta blocker effect > see HBP/ change to bystolic   See instructions for specific recommendations which were reviewed directly with the patient who was given a copy with highlighter outlining the key components.

## 2012-11-07 NOTE — Progress Notes (Signed)
  Subjective:    Patient ID: [name], female    DOB: [birth year]  MRN: [id number]  HPI   13 yowf never smoker with previous eval for asthma in 1990's by [facility] on allergy shots x 5 years but quit at around early 2000's maintained on accolate with variable sob ? better on advair but much worse since mid Jan 2014 and not responding to inhalers including advair so self referred 11/07/2012 to pulmonary clinic.   11/07/2012 1st pulmonary eval ccindolent onset progressively worsening daily wheeze / cough/ sob   x 2 weeks and no relief from advair or saba with sob with min acitivty and mostly dry cough 24 h per day but worse in hs and early in ams  No obvious daytime variabilty or assoc purulent  or cp or chest tightness, subjective wheeze overt sinus or hb symptoms. No unusual exp hx     Also denies any obvious fluctuation of symptoms with weather or environmental changes or other aggravating or alleviating factors except as outlined above    Started on coreg September 2014 per  [facility]Clean    Review of Systems  Constitutional: Positive for activity change. Negative for fever and unexpected weight change.  HENT: Positive for congestion, sneezing and postnasal drip. Negative for ear pain, nosebleeds, sore throat, rhinorrhea, trouble swallowing, dental problem and sinus pressure.   Eyes: Negative for redness and itching.  Respiratory: Positive for cough, shortness of breath and wheezing. Negative for chest tightness.   Cardiovascular: Negative for palpitations and leg swelling.  Gastrointestinal: Negative for nausea and vomiting.  Genitourinary: Negative for dysuria.  Musculoskeletal: Negative for joint swelling.  Skin: Negative for rash.  Neurological: Negative for headaches.  Hematological: Does not bruise/bleed easily.  Psychiatric/Behavioral: Negative for dysphoric mood. The patient is not nervous/anxious.        Objective:   Physical Exam Wt Readings from Last 3 Encounters:   11/07/12 183 lb 6.4 oz (83.19 kg)  10/25/12 179 lb 3.2 oz (81.285 kg)  09/21/12 177 lb (80.287 kg)   pleasant amb wf anxious/ nad  HEENT: nl dentition, turbinates, and orophanx. Nl external ear canals without cough reflex   NECK :  without JVD/Nodes/TM/ nl carotid upstrokes bilaterally   LUNGS: no acc muscle use, clear to A and P bilaterally with cough/ upper airway exp maneuvers   CV:  RRR  no s3 or murmur or increase in P2, no edema   ABD:  soft and nontender with nl excursion in the supine position. No bruits or organomegaly, bowel sounds nl  MS:  warm without deformities, calf tenderness, cyanosis or clubbing  SKIN: warm and dry without lesions    NEURO:  alert, approp, no deficits   CXR  11/07/2012 :    No acute cardiopulmonary process.        Assessment & Plan:

## 2012-11-07 NOTE — Patient Instructions (Addendum)
Change nexium Take 30- 60 min before your first and last meals of the day   Stop coreg advair and all oil based vitamins  Start bystolic 10 mg one daily   Dulera 100 Take 2 puffs first thing in am and then another 2 puffs about 12 hours later - work on smooth deep slow breath in  Prednisone 10 mg take  4 each am x 2 days,   2 each am x 2 days,  1 each am x2days and stop   Take delsym two tsp every 12 hours and supplement if needed with  tramadol 50 mg up to 2 every 4 hours to suppress the urge to cough. Swallowing water or using ice chips/non mint and menthol containing candies (such as lifesavers or sugarless jolly ranchers) are also effective.  You should rest your voice and avoid activities that you know make you cough.  Once you have eliminated the cough for 3 straight days try reducing the tramadol first,  then the delsym as tolerated.    Please schedule a follow up office visit in 2 weeks, sooner if needed

## 2012-11-07 NOTE — Assessment & Plan Note (Signed)
Strongly prefer in this setting: Bystolic, the most beta -1  selective Beta blocker available in sample form, with bisoprolol the most selective generic choice  on the market.  

## 2012-11-09 DIAGNOSIS — M999 Biomechanical lesion, unspecified: Secondary | ICD-10-CM | POA: Diagnosis not present

## 2012-11-09 DIAGNOSIS — M25559 Pain in unspecified hip: Secondary | ICD-10-CM | POA: Diagnosis not present

## 2012-11-10 DIAGNOSIS — M25559 Pain in unspecified hip: Secondary | ICD-10-CM | POA: Diagnosis not present

## 2012-11-10 DIAGNOSIS — M999 Biomechanical lesion, unspecified: Secondary | ICD-10-CM | POA: Diagnosis not present

## 2012-11-14 DIAGNOSIS — M62838 Other muscle spasm: Secondary | ICD-10-CM | POA: Diagnosis not present

## 2012-11-14 DIAGNOSIS — M25559 Pain in unspecified hip: Secondary | ICD-10-CM | POA: Diagnosis not present

## 2012-11-14 DIAGNOSIS — M999 Biomechanical lesion, unspecified: Secondary | ICD-10-CM | POA: Diagnosis not present

## 2012-11-21 ENCOUNTER — Ambulatory Visit (INDEPENDENT_AMBULATORY_CARE_PROVIDER_SITE_OTHER): Payer: Medicare Other | Admitting: Internal Medicine

## 2012-11-21 ENCOUNTER — Encounter: Payer: Self-pay | Admitting: Internal Medicine

## 2012-11-21 VITALS — BP 130/82 | HR 75 | Temp 97.6°F | Ht 62.0 in | Wt 184.0 lb

## 2012-11-21 DIAGNOSIS — M999 Biomechanical lesion, unspecified: Secondary | ICD-10-CM | POA: Diagnosis not present

## 2012-11-21 DIAGNOSIS — I10 Essential (primary) hypertension: Secondary | ICD-10-CM

## 2012-11-21 DIAGNOSIS — J45909 Unspecified asthma, uncomplicated: Secondary | ICD-10-CM

## 2012-11-21 DIAGNOSIS — M25559 Pain in unspecified hip: Secondary | ICD-10-CM | POA: Diagnosis not present

## 2012-11-21 DIAGNOSIS — R0602 Shortness of breath: Secondary | ICD-10-CM | POA: Diagnosis not present

## 2012-11-21 DIAGNOSIS — M62838 Other muscle spasm: Secondary | ICD-10-CM | POA: Diagnosis not present

## 2012-11-21 MED ORDER — MOMETASONE FURO-FORMOTEROL FUM 100-5 MCG/ACT IN AERO: INHALATION_SPRAY | RESPIRATORY_TRACT | Status: DC

## 2012-11-21 NOTE — Assessment & Plan Note (Signed)
-   HFA 75% 11/07/2012  - PFT's wnl 11/21/2012 including [id number] ? Took dulera same day(pt not sure)  Not clear why she's better at this point - she's convinced it was just tincture of time and nothing we did here.  Since it's not even clear this was asthma rec rechallenge with coreg and just use dulera prn with low threshold to change to bystolic or bisoprolol.  See instructions for specific recommendations which were reviewed directly with the patient who was given a copy with highlighter outlining the key components.

## 2012-11-21 NOTE — Assessment & Plan Note (Signed)
Changed coreg to bystolic 11/07/2012 due to refractory asthma symtpoms > ? Benefit > changed back to coreg effective 11/21/2012   Defer maint rx to Dr Jearld Pies but if airway symptoms recur Strongly prefer in this setting: Bystolic, the most beta -1  selective Beta blocker available in sample form, with bisoprolol the most selective generic choice  on the market.

## 2012-11-21 NOTE — Assessment & Plan Note (Signed)
-   11/21/2012  Walked RA x 3 laps @ 185 ft each stopped due to  End of study, no desat, no symtpoms  Symptoms are markedly disproportionate to objective findings and not clear this is a lung problem but pt does appear to have difficult airway management issues.   DDX of  difficult airways managment all start with A and  include Adherence, Ace Inhibitors, Acid Reflux, Active Sinus Disease, Alpha 1 Antitripsin deficiency, Anxiety masquerading as Airways dz,  ABPA,  allergy(esp in young), Aspiration (esp in elderly), Adverse effects of DPI,  Active smokers, plus two Bs  = Bronchiectasis and Beta blocker use..and one C= CHF   ? Anxiety  ? Acid reflux > max rx ? Adverse effect of dpi > leave off advair and just use low dose dulera prn

## 2012-11-21 NOTE — Patient Instructions (Addendum)
Ok to just use the dulera 100 2 puffs every 12 hours if you feel your breathing limits you from doing desired activities   Ok to resume coreg as before but if you find any of your previous symptoms of cough and shortness of breath worsen we'll need to start you back on bystolic until you see Dr Jearld Pies again (we can give you samples until you see him)  Continue nexium Take 30- 60 min before your first and last meals of the day  GERD (REFLUX)  is an extremely common cause of respiratory symptoms, many times with no significant heartburn at all.    It can be treated with medication, but also with lifestyle changes including avoidance of late meals, excessive alcohol, smoking cessation, and avoid fatty foods, chocolate, peppermint, colas, red wine, and acidic juices such as orange juice.  NO MINT OR MENTHOL PRODUCTS SO NO COUGH DROPS  USE SUGARLESS CANDY INSTEAD (jolley ranchers or Stover's)  NO OIL BASED VITAMINS - use powdered substitutes.  Pulmonary follow up is as needed

## 2012-11-21 NOTE — Progress Notes (Signed)
Subjective:    Patient ID: [name], female    DOB: [birth year]  MRN: [id number]     Brief patient profile:  68 yowf never smoker with previous eval for asthma in 1990's by [facility] on allergy shots x 5 years but quit at around early 2000's maintained on accolate with variable sob ? better on advair but much worse since mid Jan 2014 and not responding to inhalers including advair so self referred 11/07/2012 to pulmonary clinic.   11/07/2012 1st pulmonary eval cc indolent onset progressively worsening daily wheeze / cough/ sob   x 2 weeks and no relief from advair or saba with sob with min acitivty and mostly dry cough 24 h per day but worse in hs and early in ams rec Stop coreg advair and all oil based vitamins Start bystolic 10 mg one daily  Dulera 100 Take 2 puffs first thing in am and then another 2 puffs about 12 hours later - work on smooth deep slow breath in Prednisone 10 mg take  4 each am x 2 days,   2 each am x 2 days,  1 each am x2days and stop  Take delsym two tsp every 12 hours and supplement if needed with  tramadol 50 mg up to 2 every 4 hours to suppress the urge to cough.      11/21/2012 f/u ov/Aquan Kope cc much better breathing more active and no sign cough but limited by chest discomfort with exertion, reproducible but takes more than walking to bringing it on like picking up something heavy and walking with it.   No obvious daytime variabilty or cough or cp or chest tightness, subjective wheeze overt sinus or hb symptoms. No unusual exp hx   Sleeping ok without nocturnal  or early am exacerbation  of respiratory  c/o's or need for noct saba. Also denies any obvious fluctuation of symptoms with weather or environmental changes or other aggravating or alleviating factors except as outlined above   ROS  The following are not active complaints unless bolded sore throat, dysphagia, dental problems, itching, sneezing,  nasal congestion or excess/ purulent secretions, ear ache,    fever, chills, sweats, unintended wt loss, pleuritic or exertional cp, hemoptysis,  orthopnea pnd or leg swelling, presyncope, palpitations, heartburn, abdominal pain, anorexia, nausea, vomiting, diarrhea  or change in bowel or urinary habits, change in stools or urine, dysuria,hematuria,  rash, arthralgias, visual complaints, headache, numbness weakness or ataxia or problems with walking or coordination,  change in mood/affect or memory.                 Objective:   Physical Exam  11/21/2012  184  Wt Readings from Last 3 Encounters:  11/07/12 183 lb 6.4 oz (83.19 kg)  10/25/12 179 lb 3.2 oz (81.285 kg)  09/21/12 177 lb (80.287 kg)   pleasant amb wf anxious/ nad but trace pseudowheeze  HEENT: nl dentition, turbinates, and orophanx. Nl external ear canals without cough reflex   NECK :  without JVD/Nodes/TM/ nl carotid upstrokes bilaterally   LUNGS: no acc muscle use, clear to A and P bilaterally with cough/ upper airway exp maneuvers   CV:  RRR  no s3 or murmur or increase in P2, no edema   ABD:  soft and nontender with nl excursion in the supine position. No bruits or organomegaly, bowel sounds nl  MS:  warm without deformities, calf tenderness, cyanosis or clubbing  SKIN: warm and dry without lesions    NEURO:  alert, approp, no deficits     CXR  11/07/2012 : No acute cardiopulmonary process.        Assessment & Plan:

## 2012-11-24 DIAGNOSIS — M62838 Other muscle spasm: Secondary | ICD-10-CM | POA: Diagnosis not present

## 2012-11-24 DIAGNOSIS — M999 Biomechanical lesion, unspecified: Secondary | ICD-10-CM | POA: Diagnosis not present

## 2012-11-24 DIAGNOSIS — M25559 Pain in unspecified hip: Secondary | ICD-10-CM | POA: Diagnosis not present

## 2012-12-08 DIAGNOSIS — M62838 Other muscle spasm: Secondary | ICD-10-CM | POA: Diagnosis not present

## 2012-12-08 DIAGNOSIS — M25559 Pain in unspecified hip: Secondary | ICD-10-CM | POA: Diagnosis not present

## 2012-12-08 DIAGNOSIS — M999 Biomechanical lesion, unspecified: Secondary | ICD-10-CM | POA: Diagnosis not present

## 2012-12-13 DIAGNOSIS — M25559 Pain in unspecified hip: Secondary | ICD-10-CM | POA: Diagnosis not present

## 2012-12-13 DIAGNOSIS — M999 Biomechanical lesion, unspecified: Secondary | ICD-10-CM | POA: Diagnosis not present

## 2012-12-20 DIAGNOSIS — M25559 Pain in unspecified hip: Secondary | ICD-10-CM | POA: Diagnosis not present

## 2012-12-20 DIAGNOSIS — M62838 Other muscle spasm: Secondary | ICD-10-CM | POA: Diagnosis not present

## 2012-12-20 DIAGNOSIS — M999 Biomechanical lesion, unspecified: Secondary | ICD-10-CM | POA: Diagnosis not present

## 2012-12-26 ENCOUNTER — Encounter: Payer: Self-pay | Admitting: Cardiology

## 2012-12-26 ENCOUNTER — Ambulatory Visit (INDEPENDENT_AMBULATORY_CARE_PROVIDER_SITE_OTHER): Payer: Medicare Other | Admitting: Cardiology

## 2012-12-26 VITALS — BP 128/82 | HR 81 | Ht 62.0 in | Wt 181.0 lb

## 2012-12-26 DIAGNOSIS — I5022 Chronic systolic (congestive) heart failure: Secondary | ICD-10-CM | POA: Diagnosis not present

## 2012-12-26 DIAGNOSIS — M62838 Other muscle spasm: Secondary | ICD-10-CM | POA: Diagnosis not present

## 2012-12-26 DIAGNOSIS — E785 Hyperlipidemia, unspecified: Secondary | ICD-10-CM | POA: Diagnosis not present

## 2012-12-26 DIAGNOSIS — I428 Other cardiomyopathies: Secondary | ICD-10-CM

## 2012-12-26 DIAGNOSIS — M25559 Pain in unspecified hip: Secondary | ICD-10-CM | POA: Diagnosis not present

## 2012-12-26 DIAGNOSIS — M999 Biomechanical lesion, unspecified: Secondary | ICD-10-CM | POA: Diagnosis not present

## 2012-12-26 NOTE — Progress Notes (Signed)
Patient ID: [name], female   DOB: 16-Apr-1946, 67 y.o.   MRN: [id number] PCP: Dr. Modesto Charon  67 yo with history of HTN and type II diabetes presents for evaluation of nonischemic cardiomyopathy.  Back in 2009, she had an echo with normal EF.  She was admitted in 9/13 with chest pain and LBBB => the LBBB has proven transient.  She had LHC showing no CAD but EF 35-40%.  Echo was a technically difficult study with EF reported as 50-55%.  She has been taking ARB and Coreg at home.  Repeat echo in 12/13 showed EF 40% with septal hypokinesis.  Cardiac MRI was done in 12/13 as well, with calculated EF 50%, global hypokinesis, and no delayed enhancement.   Lately, she has been doing well.  No significant exertional dyspnea.  She has been doing a lot of yardwork to clean up from the ice storms. Main complaint at this time is knee pain.   Labs (9/13): TSH 4.884 (elevated), proBNP 155 Labs (10/13): K 3.6, creatinine 0.8 Labs (1/14): K 3.7, creatinine 0.8, BNP not elevated, ANA weakly positive, TSH normal  PMH: 1. Transient LBBB: most recent ECG in our office does not show LBBB.  2. Breast cancer s/p bilateral mastectomy. 3. GERD 4. Type II diabetes mellitus 5. HTN 6. Asthma 7. PE in 2001 8. Hyperlipidemia 9. Nonischemic Cardiomyopathy: Echo 8/09 with 60%.  Admitted in 9/13 with chest pain.  LHC showed no angiographic CAD and EF 35-40%.  Echo at that time was read as showing EF 50-55% but was a technically difficult study.  Echo (12/13): EF 40% with septal hypokinesis, normal RV size and systolic function, no significant valvular abnormalities.  Cardiac MRI (12/13): EF 50%, global hypokinesis, no delayed enhancement.   SH: Married, lives in [facility], never smoked.  FH: Uncle with ? SCD.  Brother with ? SCD.    Current Outpatient Prescriptions  Medication Sig Dispense Refill  . aspirin EC 81 MG tablet Take 81 mg by mouth daily.        Marland Kitchen atorvastatin (LIPITOR) 20 MG tablet Take 40 mg  by mouth every evening.       . Calcium Carbonate-Vitamin D (CALCIUM-VITAMIN D) 500-200 MG-UNIT per tablet Take 1 tablet by mouth 2 (two) times daily with a meal.       . carvedilol (COREG) 6.25 MG tablet Take 1 tablet by mouth 2 (two) times daily.      . cetirizine (ZYRTEC) 10 MG tablet Take 10 mg by mouth every evening.      Marland Kitchen CINNAMON PO Take 1 tablet by mouth daily.        . Coenzyme Q10 (CO Q 10 PO) Take 1 capsule by mouth daily.        . cyanocobalamin 500 MCG tablet Take 500 mcg by mouth daily. 10/25/12--Pt not sure of dose      . esomeprazole (NEXIUM) 20 MG capsule Take 20 mg by mouth 2 (two) times daily.       . fluticasone (FLONASE) 50 MCG/ACT nasal spray Place 2 sprays into the nose 2 (two) times daily as needed. allergies       . Glucosamine-Chondroitin (GLUCOSAMINE CHONDR COMPLEX PO) Take 1 tablet by mouth daily.        . metFORMIN (GLUCOPHAGE-XR) 500 MG 24 hr tablet Take 500 mg by mouth every evening.      . mometasone-formoterol (DULERA) 100-5 MCG/ACT AERO Take 2 puffs first thing in am and then another 2 puffs  about 12 hours later.  1 Inhaler  11  . Multiple Vitamins-Minerals (MULTIVITAMINS THER. W/MINERALS) TABS Take 1 tablet by mouth daily.        . nitroGLYCERIN (NITROSTAT) 0.4 MG SL tablet Place 1 tablet (0.4 mg total) under the tongue every 5 (five) minutes as needed for chest pain.  25 tablet  3  . OVER THE COUNTER MEDICATION Take 1 tablet by mouth daily. Leg cramps       . valsartan (DIOVAN) 160 MG tablet 1 and 1/2 tablets (total 240mg ) daily  45 tablet  6  . vitamin C (ASCORBIC ACID) 500 MG tablet Take 500 mg by mouth 2 (two) times daily.        . zafirlukast (ACCOLATE) 20 MG tablet Take 20 mg by mouth 2 (two) times daily.         No current facility-administered medications for this visit.    BP 128/82  Pulse 81  Ht 5\' 2"  (1.575 m)  Wt 181 lb (82.101 kg)  BMI 33.1 kg/m2 General: NAD Neck: No JVD, no thyromegaly or thyroid nodule.  Lungs: Clear to auscultation  bilaterally with normal respiratory effort. CV: Nondisplaced PMI.  Heart regular S1/S2, no S3/S4, no murmur.  No peripheral edema.  No carotid bruit.  Normal pedal pulses.  Abdomen: Soft, nontender, no hepatosplenomegaly, no distention.  Neurologic: Alert and oriented x 3.  Psych: Normal affect. Extremities: No clubbing or cyanosis.   Assessment/Plan: 1. Nonischemic cardiomyopathy: NYHA class I-II symptoms, not volume overloaded on exam.  No CAD on cath last fall.  EF 40% with septal hypokinesis on echo in 12/13, but cardiac MRI actually showed a calculated EF of 50% with global hypokinesis.  No myocardial delayed enhancement.  Had apparent transient LBBB so hard to explain cardiomyopathy as a LBBB-related cardiomyopathy.  No viral illness that she remembers prior to 9/13 admission.  EF was normal in 2009 so doubt this was related to breast cancer treatment.  BNP was mildly elevated in 9/13 but normal when checked in 1/14. Interestingly, she has a possible family history of sudden cardiac death in her brother and her uncle.   - Continue Coreg and valsartan at current doses. - Followup in 6 months, would consider repeating an echo in a year.  2. Hyperlipidemia: Continue statin given risk factors (diabetes, HTN).    Marca Ancona 12/26/2012

## 2012-12-26 NOTE — Patient Instructions (Addendum)
Your physician wants you to follow-up in: 6 months with Dr McLean. (September 2014).  You will receive a reminder letter in the mail two months in advance. If you don't receive a letter, please call our office to schedule the follow-up appointment.  

## 2012-12-27 DIAGNOSIS — M999 Biomechanical lesion, unspecified: Secondary | ICD-10-CM | POA: Diagnosis not present

## 2012-12-27 DIAGNOSIS — M62838 Other muscle spasm: Secondary | ICD-10-CM | POA: Diagnosis not present

## 2012-12-27 DIAGNOSIS — M25559 Pain in unspecified hip: Secondary | ICD-10-CM | POA: Diagnosis not present

## 2013-01-10 DIAGNOSIS — M62838 Other muscle spasm: Secondary | ICD-10-CM | POA: Diagnosis not present

## 2013-01-10 DIAGNOSIS — M25559 Pain in unspecified hip: Secondary | ICD-10-CM | POA: Diagnosis not present

## 2013-01-10 DIAGNOSIS — M999 Biomechanical lesion, unspecified: Secondary | ICD-10-CM | POA: Diagnosis not present

## 2013-01-11 ENCOUNTER — Telehealth: Payer: Self-pay | Admitting: Family Medicine

## 2013-01-11 DIAGNOSIS — M1710 Unilateral primary osteoarthritis, unspecified knee: Secondary | ICD-10-CM | POA: Diagnosis not present

## 2013-01-11 MED ORDER — GLUCOSE BLOOD VI STRP: ORAL_STRIP | Status: DC

## 2013-01-11 NOTE — Telephone Encounter (Signed)
rx refilled.

## 2013-02-06 ENCOUNTER — Telehealth: Payer: Self-pay | Admitting: Family Medicine

## 2013-02-06 MED ORDER — GLUCOSE BLOOD VI STRP: ORAL_STRIP | Status: DC

## 2013-02-06 NOTE — Telephone Encounter (Signed)
Rx Refilled  

## 2013-02-07 DIAGNOSIS — M1710 Unilateral primary osteoarthritis, unspecified knee: Secondary | ICD-10-CM | POA: Diagnosis not present

## 2013-02-14 ENCOUNTER — Other Ambulatory Visit: Payer: Self-pay | Admitting: Cardiology

## 2013-02-15 ENCOUNTER — Other Ambulatory Visit: Payer: Self-pay | Admitting: Physician Assistant

## 2013-02-15 NOTE — Telephone Encounter (Signed)
Fax Received. Refill Completed. [name] (R.M.A)   

## 2013-02-16 ENCOUNTER — Encounter: Payer: Self-pay | Admitting: Family Medicine

## 2013-02-16 ENCOUNTER — Ambulatory Visit (INDEPENDENT_AMBULATORY_CARE_PROVIDER_SITE_OTHER): Payer: Medicare Other | Admitting: Family Medicine

## 2013-02-16 VITALS — BP 120/80 | HR 78 | Temp 98.1°F | Resp 18 | Wt 182.0 lb

## 2013-02-16 DIAGNOSIS — I10 Essential (primary) hypertension: Secondary | ICD-10-CM

## 2013-02-16 DIAGNOSIS — E785 Hyperlipidemia, unspecified: Secondary | ICD-10-CM | POA: Diagnosis not present

## 2013-02-16 DIAGNOSIS — E119 Type 2 diabetes mellitus without complications: Secondary | ICD-10-CM | POA: Diagnosis not present

## 2013-02-16 LAB — BASIC METABOLIC PANEL
BUN: 21 mg/dL (ref 6–23)
CO2: 27 mEq/L (ref 19–32)
Calcium: 9.7 mg/dL (ref 8.4–10.5)
Creat: 0.74 mg/dL (ref 0.50–1.10)
Glucose, Bld: 109 mg/dL — ABNORMAL HIGH (ref 70–99)

## 2013-02-16 LAB — HEPATIC FUNCTION PANEL
ALT: 19 U/L (ref 0–35)
AST: 14 U/L (ref 0–37)
Total Protein: 6.6 g/dL (ref 6.0–8.3)

## 2013-02-16 LAB — HEMOGLOBIN A1C: Hgb A1c MFr Bld: 6 % — ABNORMAL HIGH (ref <5.7)

## 2013-02-16 LAB — LIPID PANEL
Cholesterol: 153 mg/dL (ref 0–200)
LDL Cholesterol: 84 mg/dL (ref 0–99)
Triglycerides: 105 mg/dL (ref <150)

## 2013-02-16 NOTE — Progress Notes (Signed)
Subjective:    Patient ID: [name], female    DOB: 28-Oct-1945, 67 y.o.   MRN: [id number]  HPI  Patient is a very pleasant 67 year old female here today for followup of her medical conditions. She currently has diabetes mellitus without complication type II. She has hypertension. She has hyperlipidemia. She has a history of nonischemic cardiomyopathy.  With regards to her diabetes, she is currently using metformin 500 mg once a day XR.  Her fasting blood sugars are well 120. Her 2 hour postprandial sugars are well below 130. She denies any episodes of hypoglycemia. She denies any burning or numbness in her feet.  With regards to hyperlipidemia, she is currently taking Lipitor 40 mg by mouth daily. She denies any myalgias or right upper quadrant pain.    Regards to hypertension she is currently on carvedilol, Diovan. She denies any chest pain, shortness of breath, or dyspnea on exertion. Past Medical History  Diagnosis Date  . Cancer     breast ca  . Asthma   . GERD (gastroesophageal reflux disease)   . DM2 (diabetes mellitus, type 2)   . Arthritis   . Allergy history unknown   . Hiatal hernia   . Leg cramps   . Hypertension   . Elevated cholesterol   . NICM (nonischemic cardiomyopathy)     a. Echo:  06/14/12: Poor endocardial definition, possible septal HK, EF 50-55%, normal wall motion, mild LAE ;  b.  [facility] 9/13:  normal cors, EF 35-40%,   . LBBB (left bundle branch block) 10/27/2012  . Breast cancer, left breast 10/27/2012    3.2 cm ER positive, Her-2 negative, 1 node positive S/P mastectomy/TTRAM reconstruction 4/08  . Breast cancer, right breast 10/27/2012    3.2 cm ER/PR positive, 1 node positive, Her-2 negative Rx w mastectomy, AC -T chemo, followed by aromatase inhibitor x 5 years dx 4/08  . Diabetes    Current Outpatient Prescriptions on File Prior to Visit  Medication Sig Dispense Refill  . aspirin EC 81 MG tablet Take 81 mg by mouth daily.        Marland Kitchen atorvastatin  (LIPITOR) 20 MG tablet Take 40 mg by mouth every evening.       . Calcium Carbonate-Vitamin D (CALCIUM-VITAMIN D) 500-200 MG-UNIT per tablet Take 1 tablet by mouth 2 (two) times daily with a meal.       . carvedilol (COREG) 6.25 MG tablet Take 1 tablet by mouth 2 (two) times daily.      . carvedilol (COREG) 6.25 MG tablet TAKE 1 TABLET (6.25 MG TOTAL) BY MOUTH 2 (TWO) TIMES DAILY WITH A MEAL.  60 tablet  5  . carvedilol (COREG) 6.25 MG tablet TAKE 1 TABLET (6.25 MG TOTAL) BY MOUTH 2 (TWO) TIMES DAILY WITH A MEAL.  60 tablet  6  . cetirizine (ZYRTEC) 10 MG tablet Take 10 mg by mouth every evening.      Marland Kitchen CINNAMON PO Take 1 tablet by mouth daily.        . Coenzyme Q10 (CO Q 10 PO) Take 1 capsule by mouth daily.        . cyanocobalamin 500 MCG tablet Take 500 mcg by mouth daily. 10/25/12--Pt not sure of dose      . DIOVAN 160 MG tablet TAKE 1 AND 1/2 TABLETS (TOTAL 240MG ) DAILY  45 tablet  6  . esomeprazole (NEXIUM) 20 MG capsule Take 20 mg by mouth 2 (two) times daily.       Marland Kitchen  fluticasone (FLONASE) 50 MCG/ACT nasal spray Place 2 sprays into the nose 2 (two) times daily as needed. allergies       . Glucosamine-Chondroitin (GLUCOSAMINE CHONDR COMPLEX PO) Take 1 tablet by mouth daily.        Marland Kitchen glucose blood (ACCU-CHEK COMFORT CURVE) test strip Check bs bid  180 each  11  . metFORMIN (GLUCOPHAGE-XR) 500 MG 24 hr tablet Take 500 mg by mouth every evening.      . mometasone-formoterol (DULERA) 100-5 MCG/ACT AERO Take 2 puffs first thing in am and then another 2 puffs about 12 hours later.  1 Inhaler  11  . Multiple Vitamins-Minerals (MULTIVITAMINS THER. W/MINERALS) TABS Take 1 tablet by mouth daily.        . nitroGLYCERIN (NITROSTAT) 0.4 MG SL tablet Place 1 tablet (0.4 mg total) under the tongue every 5 (five) minutes as needed for chest pain.  25 tablet  3  . OVER THE COUNTER MEDICATION Take 1 tablet by mouth daily. Leg cramps      . vitamin C (ASCORBIC ACID) 500 MG tablet Take 500 mg by mouth 2 (two)  times daily.        . zafirlukast (ACCOLATE) 20 MG tablet Take 20 mg by mouth 2 (two) times daily.         No current facility-administered medications on file prior to visit.   Allergies  Allergen Reactions  . Adhesive (Tape) Other (See Comments)    Skin turns red   . Diphenhydramine Other (See Comments)    Pt feels wired   . Vicodin (Hydrocodone-Acetaminophen) Nausea And Vomiting     Review of Systems  All other systems reviewed and are negative.       Objective:   Physical Exam  Vitals reviewed. Constitutional: She is oriented to person, place, and time. She appears well-developed and well-nourished.  Cardiovascular: Normal rate, regular rhythm, normal heart sounds and intact distal pulses.  Exam reveals no gallop and no friction rub.   No murmur heard. Pulmonary/Chest: Effort normal and breath sounds normal. No respiratory distress. She has no wheezes. She has no rales. She exhibits no tenderness.  Abdominal: Soft. Bowel sounds are normal. She exhibits no distension and no mass. There is no tenderness. There is no rebound and no guarding.  Neurological: She is alert and oriented to person, place, and time. She has normal reflexes. She exhibits normal muscle tone. Coordination normal.  Skin: Skin is warm and dry. No rash noted. No erythema. No pallor.   diabetic foot exam is performed        Assessment & Plan:  1. Type II or unspecified type diabetes mellitus without mention of complication, not stated as uncontrolled Check hemoglobin A1c. Goal hemoglobin A1c is less than 6.5. The patient's foot exam is normal. She seen an ophthalmologist every year. Her blood pressures currently well controlled. Advised her to continue a low carbohydrate diet. - Basic Metabolic Panel - Hemoglobin A1c - Hepatic Function Panel - Lipid Panel #2 hypertension. Currently well controlled, continue current medications at current dosages.  #3 is hyperlipidemia. Check fasting lipid panel.  Goal LDL is less than 100.

## 2013-02-20 ENCOUNTER — Telehealth: Payer: Self-pay | Admitting: Family Medicine

## 2013-02-21 ENCOUNTER — Telehealth: Payer: Self-pay | Admitting: Family Medicine

## 2013-02-21 MED ORDER — METFORMIN HCL ER 500 MG PO TB24: 500.0000 mg | ORAL_TABLET | Freq: Every evening | ORAL | Status: DC

## 2013-02-21 NOTE — Telephone Encounter (Signed)
Medication refilled per protocol. 

## 2013-02-23 NOTE — Telephone Encounter (Signed)
Done 02/21/13

## 2013-04-18 ENCOUNTER — Telehealth: Payer: Self-pay | Admitting: Family Medicine

## 2013-04-18 NOTE — Telephone Encounter (Signed)
Pt had dropped off blood glucose readings for provider.  Patient was called to report per provider that reading are excellent.  To continue current course of treatment/medications

## 2013-04-20 ENCOUNTER — Encounter: Payer: Self-pay | Admitting: Physician Assistant

## 2013-04-20 ENCOUNTER — Ambulatory Visit (INDEPENDENT_AMBULATORY_CARE_PROVIDER_SITE_OTHER): Payer: Medicare Other | Admitting: Physician Assistant

## 2013-04-20 VITALS — BP 120/79 | HR 77 | Ht 62.5 in | Wt 180.0 lb

## 2013-04-20 DIAGNOSIS — R5383 Other fatigue: Secondary | ICD-10-CM | POA: Diagnosis not present

## 2013-04-20 DIAGNOSIS — I429 Cardiomyopathy, unspecified: Secondary | ICD-10-CM

## 2013-04-20 DIAGNOSIS — E785 Hyperlipidemia, unspecified: Secondary | ICD-10-CM

## 2013-04-20 DIAGNOSIS — I10 Essential (primary) hypertension: Secondary | ICD-10-CM

## 2013-04-20 DIAGNOSIS — R0602 Shortness of breath: Secondary | ICD-10-CM

## 2013-04-20 DIAGNOSIS — K219 Gastro-esophageal reflux disease without esophagitis: Secondary | ICD-10-CM

## 2013-04-20 DIAGNOSIS — R5381 Other malaise: Secondary | ICD-10-CM | POA: Diagnosis not present

## 2013-04-20 DIAGNOSIS — R079 Chest pain, unspecified: Secondary | ICD-10-CM

## 2013-04-20 LAB — CBC WITH DIFFERENTIAL/PLATELET
Basophils Relative: 0.6 % (ref 0.0–3.0)
Eosinophils Absolute: 0.2 10*3/uL (ref 0.0–0.7)
Eosinophils Relative: 3.3 % (ref 0.0–5.0)
HCT: 36.6 % (ref 36.0–46.0)
Lymphs Abs: 2.4 10*3/uL (ref 0.7–4.0)
MCHC: 34.4 g/dL (ref 30.0–36.0)
MCV: 94 fl (ref 78.0–100.0)
Monocytes Absolute: 0.5 10*3/uL (ref 0.1–1.0)
Neutrophils Relative %: 47.7 % (ref 43.0–77.0)
RBC: 3.89 Mil/uL (ref 3.87–5.11)
WBC: 6 10*3/uL (ref 4.5–10.5)

## 2013-04-20 LAB — BRAIN NATRIURETIC PEPTIDE: Pro B Natriuretic peptide (BNP): 19 pg/mL (ref 0.0–100.0)

## 2013-04-20 LAB — BASIC METABOLIC PANEL
BUN: 19 mg/dL (ref 6–23)
CO2: 28 mEq/L (ref 19–32)
Chloride: 107 mEq/L (ref 96–112)
Creatinine, Ser: 0.7 mg/dL (ref 0.4–1.2)
Potassium: 3.6 mEq/L (ref 3.5–5.1)

## 2013-04-20 MED ORDER — ESOMEPRAZOLE MAGNESIUM 40 MG PO CPDR: 40.0000 mg | DELAYED_RELEASE_CAPSULE | Freq: Two times a day (BID) | ORAL | Status: DC

## 2013-04-20 MED ORDER — NITROGLYCERIN 0.4 MG SL SUBL: 0.4000 mg | SUBLINGUAL_TABLET | SUBLINGUAL | Status: DC | PRN

## 2013-04-20 NOTE — Progress Notes (Signed)
[address] [place], [place]  [address] Phone: [phone] Fax:  [phone]  Date:  04/20/2013   ID:  [name], [name] 12/13/1945, MRN [id number]  PCP:  Leo Grosser, MD  Cardiologist:  Dr. Marca Ancona     History of Present Illness: [name] is a 67 y.o. female who returns for evaluation of chest pain.  She has a history of HTN, type II diabetes presents, nonischemic cardiomyopathy. Back in 2009, she had an echo with normal EF. She was admitted in 9/13 with chest pain and LBBB => the LBBB has proven transient. She had LHC showing no CAD but EF 35-40%. Echo was a technically difficult study with EF reported as 50-55%. She is taking ARB and Coreg. Repeat echo in 12/13 showed EF 40% with septal hypokinesis. Cardiac MRI was done in 12/13 as well, with calculated EF 50%, global hypokinesis, and no delayed enhancement. Last seen by Dr. Shirlee Latch in 12/2012.  Over the last 2 months, she has noted chest discomfort. It comes and goes. It may last hours. Usually occurs at rest. She denies exertional chest discomfort. She does note discomfort after eating at times. She notes difficulty in swallowing pills. She denies belching or water brash symptoms. She denies vomiting or hematemesis. She does note dyspnea with exertion. She probably describes NYHA class II-IIb symptoms. She denies orthopnea, PND or edema. She denies syncope.  She denies pleuritic chest pain. She denies chest pain with lying supine. She has not tried antacids. She has tried nitroglycerin without significant improvement.  Labs (9/13): TSH 4.884 (elevated), proBNP 155  Labs (10/13): K 3.6, creatinine 0.8  Labs (1/14): K 3.7, creatinine 0.8, BNP not elevated, ANA weakly positive, TSH normal  Labs (5/14):  K 4.3, Cr 0.74, ALT 19, LDL 84, A1c 6.0   Wt Readings from Last 3 Encounters:  04/20/13 180 lb (81.647 kg)  02/16/13 182 lb (82.555 kg)  12/26/12 181 lb (82.101 kg)    Past Medical History: 1.  Transient LBBB: most recent ECG in our office does not show LBBB.  2. Breast cancer s/p bilateral mastectomy.  3. GERD  4. Type II diabetes mellitus  5. HTN  6. Asthma  7. PE in 2001  8. Hyperlipidemia  9. Nonischemic Cardiomyopathy: Echo 8/09 with 60%. Admitted in 9/13 with chest pain. LHC showed no angiographic CAD and EF 35-40%. Echo at that time was read as showing EF 50-55% but was a technically difficult study. Echo (12/13): EF 40% with septal hypokinesis, normal RV size and systolic function, no significant valvular abnormalities. Cardiac MRI (12/13): EF 50%, global hypokinesis, no delayed enhancement.   Past Medical History  Diagnosis Date  . Cancer     breast ca  . Asthma   . GERD (gastroesophageal reflux disease)   . DM2 (diabetes mellitus, type 2)   . Arthritis   . Allergy history unknown   . Hiatal hernia   . Leg cramps   . Hypertension   . Elevated cholesterol   . NICM (nonischemic cardiomyopathy)     a. Echo:  06/14/12: Poor endocardial definition, possible septal HK, EF 50-55%, normal wall motion, mild LAE ;  b.  [facility] 9/13:  normal cors, EF 35-40%,   . LBBB (left bundle branch block) 10/27/2012  . Breast cancer, left breast 10/27/2012    3.2 cm ER positive, Her-2 negative, 1 node positive S/P mastectomy/TTRAM reconstruction 4/08  . Breast cancer, right breast 10/27/2012    3.2 cm ER/PR  positive, 1 node positive, Her-2 negative Rx w mastectomy, AC -T chemo, followed by aromatase inhibitor x 5 years dx 4/08  . Diabetes     Current Outpatient Prescriptions  Medication Sig Dispense Refill  . aspirin EC 81 MG tablet Take 81 mg by mouth daily.        Marland Kitchen atorvastatin (LIPITOR) 20 MG tablet Take 40 mg by mouth every evening.       . Calcium Carbonate-Vitamin D (CALCIUM-VITAMIN D) 500-200 MG-UNIT per tablet Take 1 tablet by mouth 2 (two) times daily with a meal.       . carvedilol (COREG) 6.25 MG tablet TAKE 1 TABLET (6.25 MG TOTAL) BY MOUTH 2 (TWO) TIMES DAILY WITH A MEAL.  60  tablet  5  . cetirizine (ZYRTEC) 10 MG tablet Take 10 mg by mouth every evening.      Marland Kitchen CINNAMON PO Take 1 tablet by mouth daily.        . Coenzyme Q10 (CO Q 10 PO) Take 1 capsule by mouth daily.        . cyanocobalamin 500 MCG tablet Take 500 mcg by mouth daily. 10/25/12--Pt not sure of dose      . DIOVAN 160 MG tablet TAKE 1 AND 1/2 TABLETS (TOTAL 240MG ) DAILY  45 tablet  6  . esomeprazole (NEXIUM) 20 MG capsule Take 20 mg by mouth 2 (two) times daily.       . fluticasone (FLONASE) 50 MCG/ACT nasal spray Place 2 sprays into the nose 2 (two) times daily as needed. allergies       . Glucosamine-Chondroitin (GLUCOSAMINE CHONDR COMPLEX PO) Take 1 tablet by mouth daily.        Marland Kitchen glucose blood (ACCU-CHEK COMFORT CURVE) test strip Check bs bid  180 each  11  . metFORMIN (GLUCOPHAGE-XR) 500 MG 24 hr tablet Take 1 tablet (500 mg total) by mouth every evening.  90 tablet  1  . mometasone-formoterol (DULERA) 100-5 MCG/ACT AERO Take 2 puffs first thing in am and then another 2 puffs about 12 hours later.  1 Inhaler  11  . Multiple Vitamins-Minerals (MULTIVITAMINS THER. W/MINERALS) TABS Take 1 tablet by mouth daily.        . nitroGLYCERIN (NITROSTAT) 0.4 MG SL tablet Place 1 tablet (0.4 mg total) under the tongue every 5 (five) minutes as needed for chest pain.  25 tablet  3  . OVER THE COUNTER MEDICATION Take 1 tablet by mouth daily. Leg cramps      . vitamin C (ASCORBIC ACID) 500 MG tablet Take 500 mg by mouth 2 (two) times daily.        . zafirlukast (ACCOLATE) 20 MG tablet Take 20 mg by mouth 2 (two) times daily.         No current facility-administered medications for this visit.    Allergies:    Allergies  Allergen Reactions  . Adhesive (Tape) Other (See Comments)    Skin turns red   . Diphenhydramine Other (See Comments)    Pt feels wired   . Vicodin (Hydrocodone-Acetaminophen) Nausea And Vomiting    Social History:  The patient  reports that she has never smoked. She has never used  smokeless tobacco. She reports that she does not drink alcohol or use illicit drugs.   ROS:  Please see the history of present illness.   She takes frequent naps. She does not believe that she snores.   All other systems reviewed and negative.   PHYSICAL EXAM: VS:  BP 120/79  Pulse 77  Ht 5' 2.5" (1.588 m)  Wt 180 lb (81.647 kg)  BMI 32.38 kg/m2 Well nourished, well developed, in no acute distress HEENT: normal Neck: no JVD Cardiac:  normal S1, S2; RRR; no murmur Lungs:  clear to auscultation bilaterally, no wheezing, rhonchi or rales Abd: soft, nontender, no hepatomegaly Ext: no edema Skin: warm and dry Neuro:  CNs 2-12 intact, no focal abnormalities noted  EKG:  NSR, HR 77, normal axis, nonspecific ST-T wave changes     ASSESSMENT AND PLAN:  1. Chest Pain:  Etiology not entirely clear. She certainly has a component that is suggestive of GERD as a cause of her symptoms. I have recommended that she increase her Nexium to 40 mg twice a day. If she has some improvement with this, I have advised her to go ahead and schedule follow up with gastroenterology.  She had normal coronary arteries by cardiac catheterization last year. At this point, I am not convinced she needs follow up stress testing.  I have encouraged her to refill her NTG (current bottle is a year old) and try it again to see if it helps.   2. Non-Ischemic Cardiomyopathy:  She does note some dyspnea. She also notes fatigue. I will arrange a follow up echocardiogram to assess for worsening LV function.  Check a basic metabolic panel and BNP today. 3. Fatigue: She does not really have significant symptoms suggestive of sleep apnea. Obtain testing as noted above. Check a CBC and TSH today. 4. Hypertension:  Controlled. 5. Hyperlipidemia:  Continue statin. 6. GERD: Increase PPI as noted. 7. Disposition:  Follow up in 4 weeks with either me or Dr. Shirlee Latch  Signed, Tereso Newcomer, PA-C  04/20/2013 10:33 AM

## 2013-04-20 NOTE — Patient Instructions (Addendum)
Will obtain labs today and call you with the results (bmet/cbc/tsh/bnp)  INCREASE YOUR NEXIUM TO 40 MG TWICE A DAY  Your physician recommends that you schedule a follow-up appointment in: 3-4 weeks with Dr Shirlee Latch or with Bing Neighbors PA (on a day when Dr Shirlee Latch is in the office)  Your physician has requested that you have an echocardiogram. Echocardiography is a painless test that uses sound waves to create images of your heart. It provides your doctor with information about the size and shape of your heart and how well your heart's chambers and valves are working. This procedure takes approximately one hour. There are no restrictions for this procedure.

## 2013-05-03 ENCOUNTER — Encounter: Payer: Self-pay | Admitting: Physician Assistant

## 2013-05-03 ENCOUNTER — Ambulatory Visit (HOSPITAL_COMMUNITY): Payer: Medicare Other | Attending: Cardiovascular Disease

## 2013-05-03 DIAGNOSIS — R079 Chest pain, unspecified: Secondary | ICD-10-CM | POA: Diagnosis not present

## 2013-05-03 DIAGNOSIS — E119 Type 2 diabetes mellitus without complications: Secondary | ICD-10-CM | POA: Diagnosis not present

## 2013-05-03 DIAGNOSIS — E785 Hyperlipidemia, unspecified: Secondary | ICD-10-CM | POA: Insufficient documentation

## 2013-05-03 DIAGNOSIS — R5383 Other fatigue: Secondary | ICD-10-CM | POA: Insufficient documentation

## 2013-05-03 DIAGNOSIS — R0602 Shortness of breath: Secondary | ICD-10-CM | POA: Diagnosis not present

## 2013-05-03 DIAGNOSIS — Z853 Personal history of malignant neoplasm of breast: Secondary | ICD-10-CM | POA: Diagnosis not present

## 2013-05-03 DIAGNOSIS — I428 Other cardiomyopathies: Secondary | ICD-10-CM | POA: Diagnosis not present

## 2013-05-03 DIAGNOSIS — I10 Essential (primary) hypertension: Secondary | ICD-10-CM | POA: Diagnosis not present

## 2013-05-03 DIAGNOSIS — R072 Precordial pain: Secondary | ICD-10-CM

## 2013-05-03 DIAGNOSIS — R5381 Other malaise: Secondary | ICD-10-CM | POA: Insufficient documentation

## 2013-05-03 DIAGNOSIS — I447 Left bundle-branch block, unspecified: Secondary | ICD-10-CM | POA: Insufficient documentation

## 2013-05-03 NOTE — Progress Notes (Signed)
Echocardiogram performed.  

## 2013-05-04 ENCOUNTER — Telehealth: Payer: Self-pay | Admitting: *Deleted

## 2013-05-04 NOTE — Telephone Encounter (Signed)
Message copied by Tarri Fuller on Thu May 04, 2013  4:06 PM ------      Message from: [place], [place] T      Created: Wed May 03, 2013  9:21 PM       EF normal      Continue with current treatment plan.      Tereso Newcomer, PA-C        05/03/2013 9:20 PM ------

## 2013-05-04 NOTE — Telephone Encounter (Signed)
pt notified about echo results with verbal understanding 

## 2013-05-10 ENCOUNTER — Ambulatory Visit (INDEPENDENT_AMBULATORY_CARE_PROVIDER_SITE_OTHER): Payer: Medicare Other | Admitting: Physician Assistant

## 2013-05-10 ENCOUNTER — Other Ambulatory Visit: Payer: Self-pay

## 2013-05-10 ENCOUNTER — Encounter: Payer: Self-pay | Admitting: Physician Assistant

## 2013-05-10 VITALS — BP 119/74 | HR 72 | Ht 62.0 in | Wt 182.0 lb

## 2013-05-10 DIAGNOSIS — J45909 Unspecified asthma, uncomplicated: Secondary | ICD-10-CM | POA: Diagnosis not present

## 2013-05-10 DIAGNOSIS — K219 Gastro-esophageal reflux disease without esophagitis: Secondary | ICD-10-CM

## 2013-05-10 DIAGNOSIS — E785 Hyperlipidemia, unspecified: Secondary | ICD-10-CM

## 2013-05-10 DIAGNOSIS — R0602 Shortness of breath: Secondary | ICD-10-CM | POA: Diagnosis not present

## 2013-05-10 DIAGNOSIS — I10 Essential (primary) hypertension: Secondary | ICD-10-CM

## 2013-05-10 DIAGNOSIS — I429 Cardiomyopathy, unspecified: Secondary | ICD-10-CM | POA: Diagnosis not present

## 2013-05-10 DIAGNOSIS — R079 Chest pain, unspecified: Secondary | ICD-10-CM | POA: Diagnosis not present

## 2013-05-10 NOTE — Patient Instructions (Addendum)
NO CHANGES WERE MADE TODAY  PLEASE CALL DR.EDWARDS FOR A FOLLOW UP APPT FOR GERD  PLEASE CALL DR.WART FOR A FOLLOW UP APPT FOR ASTHMA  FOLLOW UP WITH DR.MCLEAN IN 4 MONTHS

## 2013-05-10 NOTE — Progress Notes (Signed)
[address] [place], [place]  [address] Phone: [phone] Fax:  [phone]  Date:  05/10/2013   ID:  Victoria, Terry October 17, 1945, MRN [id number]  PCP:  Leo Grosser, MD  Cardiologist:  Dr. Marca Ancona     History of Present Illness: [name] is a 67 y.o. female who returns for f/u.  She has a hx of HTN, DM2, nonischemic cardiomyopathy. In 2009, she had an echo with normal EF. She was admitted in 9/13 with chest pain and LBBB => the LBBB has proven transient. She had LHC showing no CAD but EF 35-40%. Echo was a technically difficult study with EF reported as 50-55%. She is taking ARB and Coreg. Repeat echo in 12/13 showed EF 40% with septal hypokinesis. Cardiac MRI was done in 12/13 as well, with calculated EF 50%, global hypokinesis, and no delayed enhancement.   I saw her 04/20/13 with a 2 month hx of chest discomfort. It would come and go and may last hours. It would usually occur at rest. She denied exertional chest discomfort. She did note discomfort after eating at times. She had difficulty in swallowing pills.  She did note dyspnea with exertion. She is NYHA class II-IIb. I increased her PPI.  Echo was arranged and demonstrated EF 50-55%, mild LAE.  Since increasing her PPI to twice daily dosing, she has not had any further chest discomfort. She is still discouraged by dyspnea with exertion. She describes NYHA class IIb symptoms. She denies orthopnea, PND or edema. She denies syncope.  Labs (9/13): TSH 4.884 (elevated), proBNP 155  Labs (10/13): K 3.6, creatinine 0.8  Labs (1/14): K 3.7, creatinine 0.8, BNP not elevated, ANA weakly positive, TSH normal  Labs (5/14):  K 4.3, Cr 0.74, ALT 19, LDL 84, A1c 6.0 Labs (7/14):  K 3.6, creatinine 0.7, BNP 19, Hgb 12.6, TSH 1.18  Wt Readings from Last 3 Encounters:  05/10/13 182 lb (82.555 kg)  04/20/13 180 lb (81.647 kg)  02/16/13 182 lb (82.555 kg)    Past Medical History: 1. Transient LBBB: most  recent ECG in our office does not show LBBB.  2. Breast cancer s/p bilateral mastectomy.  3. GERD  4. Type II diabetes mellitus  5. HTN  6. Asthma  7. PE in 2001  8. Hyperlipidemia  9. Nonischemic Cardiomyopathy: Echo 8/09 with 60%. Admitted in 9/13 with chest pain. LHC showed no angiographic CAD and EF 35-40%. Echo at that time was read as showing EF 50-55% but was a technically difficult study. Echo (12/13): EF 40% with septal hypokinesis, normal RV size and systolic function, no significant valvular abnormalities. Cardiac MRI (12/13): EF 50%, global hypokinesis, no delayed enhancement.  Echo 7/14: EF 50-55%, mild LAE   Past Medical History  Diagnosis Date  . Cancer     breast ca  . Asthma   . GERD (gastroesophageal reflux disease)   . DM2 (diabetes mellitus, type 2)   . Arthritis   . Allergy history unknown   . Hiatal hernia   . Leg cramps   . Hypertension   . Elevated cholesterol   . NICM (nonischemic cardiomyopathy)     a. Echo:  06/14/12: Poor endocardial definition, possible septal HK, EF 50-55%, normal wall motion, mild LAE ;  b.  [facility] 9/13:  normal cors, EF 35-40%,  c. Echo 7/14: EF 50-55%, mild LAE  . LBBB (left bundle branch block) 10/27/2012  . Breast cancer, left breast 10/27/2012    3.2  cm ER positive, Her-2 negative, 1 node positive S/P mastectomy/TTRAM reconstruction 4/08  . Breast cancer, right breast 10/27/2012    3.2 cm ER/PR positive, 1 node positive, Her-2 negative Rx w mastectomy, AC -T chemo, followed by aromatase inhibitor x 5 years dx 4/08  . Diabetes     Current Outpatient Prescriptions  Medication Sig Dispense Refill  . aspirin EC 81 MG tablet Take 81 mg by mouth daily.        Marland Kitchen atorvastatin (LIPITOR) 20 MG tablet Take 40 mg by mouth every evening.       . Calcium Carbonate-Vitamin D (CALCIUM-VITAMIN D) 500-200 MG-UNIT per tablet Take 1 tablet by mouth 2 (two) times daily with a meal.       . carvedilol (COREG) 6.25 MG tablet TAKE 1 TABLET (6.25 MG TOTAL)  BY MOUTH 2 (TWO) TIMES DAILY WITH A MEAL.  60 tablet  5  . cetirizine (ZYRTEC) 10 MG tablet Take 10 mg by mouth every evening.      Marland Kitchen CINNAMON PO Take 1 tablet by mouth daily.        . Coenzyme Q10 (CO Q 10 PO) Take 1 capsule by mouth daily.        . cyanocobalamin 500 MCG tablet Take 500 mcg by mouth daily. 10/25/12--Pt not sure of dose      . DIOVAN 160 MG tablet TAKE 1 AND 1/2 TABLETS (TOTAL 240MG ) DAILY  45 tablet  6  . esomeprazole (NEXIUM) 40 MG capsule Take 1 capsule (40 mg total) by mouth 2 (two) times daily.  60 capsule  5  . fluticasone (FLONASE) 50 MCG/ACT nasal spray Place 2 sprays into the nose 2 (two) times daily as needed. allergies       . Glucosamine-Chondroitin (GLUCOSAMINE CHONDR COMPLEX PO) Take 1 tablet by mouth daily.        Marland Kitchen glucose blood (ACCU-CHEK COMFORT CURVE) test strip Check bs bid  180 each  11  . metFORMIN (GLUCOPHAGE-XR) 500 MG 24 hr tablet Take 1 tablet (500 mg total) by mouth every evening.  90 tablet  1  . mometasone-formoterol (DULERA) 100-5 MCG/ACT AERO Take 2 puffs first thing in am and then another 2 puffs about 12 hours later.  1 Inhaler  11  . Multiple Vitamins-Minerals (MULTIVITAMINS THER. W/MINERALS) TABS Take 1 tablet by mouth daily.        . nitroGLYCERIN (NITROSTAT) 0.4 MG SL tablet Place 1 tablet (0.4 mg total) under the tongue every 5 (five) minutes as needed for chest pain.  25 tablet  prn  . OVER THE COUNTER MEDICATION Take 1 tablet by mouth daily. Leg cramps      . vitamin C (ASCORBIC ACID) 500 MG tablet Take 500 mg by mouth 2 (two) times daily.        . zafirlukast (ACCOLATE) 20 MG tablet Take 20 mg by mouth 2 (two) times daily.         No current facility-administered medications for this visit.    Allergies:    Allergies  Allergen Reactions  . Adhesive (Tape) Other (See Comments)    Skin turns red   . Diphenhydramine Other (See Comments)    Pt feels wired   . Vicodin (Hydrocodone-Acetaminophen) Nausea And Vomiting    Social  History:  The patient  reports that she has never smoked. She has never used smokeless tobacco. She reports that she does not drink alcohol or use illicit drugs.   ROS:  Please see the history of present  illness.    No melena, hematochezia, fevers. She does note an episode of nausea and vomiting a few weeks ago.  All other systems reviewed and negative.   PHYSICAL EXAM: VS:  BP 119/74  Pulse 72  Ht 5\' 2"  (1.575 m)  Wt 182 lb (82.555 kg)  BMI 33.28 kg/m2 Well nourished, well developed, in no acute distress HEENT: normal Neck: no JVD at 90 Cardiac:  normal S1, S2; RRR; no murmur Lungs:  clear to auscultation bilaterally, no wheezing, rhonchi or rales Abd: soft, nontender, no hepatomegaly Ext: no edema Skin: warm and dry Neuro:  CNs 2-12 intact, no focal abnormalities noted  EKG:  NSR, HR 72, normal axis, no change from prior tracing    ASSESSMENT AND PLAN:  1. Chest Pain:  Resolved. I suspect an element of acid reflux is contributing to her symptoms. No further cardiac workup. 2. Non-Ischemic Cardiomyopathy:  Volume is stable. Continue current therapy. Recent echocardiogram with normal LV function. 3. Asthma: I suspect asthma is at least in part contributing to her shortness of breath. Acid reflux would likely make this worse. I have asked her to followup with Dr. Sherene Sires. 4. Hypertension:  Controlled. 5. Hyperlipidemia:  Continue statin. 6. GERD:  Given her recent symptoms including nausea and vomiting, I have encouraged her to arrange followup with Dr. Randa Evens (GI). 7. Disposition:  Follow up with Dr. Shirlee Latch in 4 months.   Signed, Tereso Newcomer, PA-C  05/10/2013 8:54 AM

## 2013-05-23 DIAGNOSIS — Z1231 Encounter for screening mammogram for malignant neoplasm of breast: Secondary | ICD-10-CM | POA: Diagnosis not present

## 2013-06-06 ENCOUNTER — Encounter: Payer: Self-pay | Admitting: Family Medicine

## 2013-06-12 ENCOUNTER — Encounter: Payer: Self-pay | Admitting: Family Medicine

## 2013-06-12 ENCOUNTER — Ambulatory Visit (INDEPENDENT_AMBULATORY_CARE_PROVIDER_SITE_OTHER): Payer: Medicare Other | Admitting: Family Medicine

## 2013-06-12 VITALS — BP 118/70 | HR 78 | Temp 97.9°F | Resp 18 | Wt 181.0 lb

## 2013-06-12 DIAGNOSIS — K5732 Diverticulitis of large intestine without perforation or abscess without bleeding: Secondary | ICD-10-CM | POA: Diagnosis not present

## 2013-06-12 MED ORDER — METRONIDAZOLE 500 MG PO TABS: 500.0000 mg | ORAL_TABLET | Freq: Three times a day (TID) | ORAL | Status: DC

## 2013-06-12 MED ORDER — CIPROFLOXACIN HCL 500 MG PO TABS: 500.0000 mg | ORAL_TABLET | Freq: Two times a day (BID) | ORAL | Status: DC

## 2013-06-12 NOTE — Progress Notes (Signed)
Subjective:    Patient ID: [name], female    DOB: 1946/03/16, 67 y.o.   MRN: [id number]  HPI  Patient presents with lower abdominal pain. She was constipated all last week while on vacation. Beginning Saturday she started experiencing left lower quadrant abdominal pain. The pain is 5/10. She denies any fevers nausea or vomiting. The constipation has resolved. She is now having one to 2 loose stools per day. She denies any hematochezia or melena. She denies any upper abdominal pain or right upper quadrant pain. She denies any right lower quadrant pain. She denies any dysuria, hematuria, urgency. She denies any vaginal discharge or vaginal bleeding. Past Medical History  Diagnosis Date  . Cancer     breast ca  . Asthma   . GERD (gastroesophageal reflux disease)   . DM2 (diabetes mellitus, type 2)   . Arthritis   . Allergy history unknown   . Hiatal hernia   . Leg cramps   . Hypertension   . Elevated cholesterol   . NICM (nonischemic cardiomyopathy)     a. Echo:  06/14/12: Poor endocardial definition, possible septal HK, EF 50-55%, normal wall motion, mild LAE ;  b.  [facility] 9/13:  normal cors, EF 35-40%,  c. Echo 7/14: EF 50-55%, mild LAE  . LBBB (left bundle branch block) 10/27/2012  . Breast cancer, left breast 10/27/2012    3.2 cm ER positive, Her-2 negative, 1 node positive S/P mastectomy/TTRAM reconstruction 4/08  . Breast cancer, right breast 10/27/2012    3.2 cm ER/PR positive, 1 node positive, Her-2 negative Rx w mastectomy, AC -T chemo, followed by aromatase inhibitor x 5 years dx 4/08  . Diabetes    Current Outpatient Prescriptions on File Prior to Visit  Medication Sig Dispense Refill  . aspirin EC 81 MG tablet Take 81 mg by mouth daily.        Marland Kitchen atorvastatin (LIPITOR) 20 MG tablet Take 40 mg by mouth every evening.       . Calcium Carbonate-Vitamin D (CALCIUM-VITAMIN D) 500-200 MG-UNIT per tablet Take 1 tablet by mouth 2 (two) times daily with a meal.       . carvedilol  (COREG) 6.25 MG tablet TAKE 1 TABLET (6.25 MG TOTAL) BY MOUTH 2 (TWO) TIMES DAILY WITH A MEAL.  60 tablet  5  . cetirizine (ZYRTEC) 10 MG tablet Take 10 mg by mouth every evening.      Marland Kitchen CINNAMON PO Take 1 tablet by mouth daily.        . Coenzyme Q10 (CO Q 10 PO) Take 1 capsule by mouth daily.        . cyanocobalamin 500 MCG tablet Take 500 mcg by mouth daily. 10/25/12--Pt not sure of dose      . DIOVAN 160 MG tablet TAKE 1 AND 1/2 TABLETS (TOTAL 240MG ) DAILY  45 tablet  6  . esomeprazole (NEXIUM) 40 MG capsule Take 1 capsule (40 mg total) by mouth 2 (two) times daily.  60 capsule  5  . fluticasone (FLONASE) 50 MCG/ACT nasal spray Place 2 sprays into the nose 2 (two) times daily as needed. allergies       . Glucosamine-Chondroitin (GLUCOSAMINE CHONDR COMPLEX PO) Take 1 tablet by mouth daily.        Marland Kitchen glucose blood (ACCU-CHEK COMFORT CURVE) test strip Check bs bid  180 each  11  . metFORMIN (GLUCOPHAGE-XR) 500 MG 24 hr tablet Take 1 tablet (500 mg total) by mouth every evening.  90  tablet  1  . mometasone-formoterol (DULERA) 100-5 MCG/ACT AERO Take 2 puffs first thing in am and then another 2 puffs about 12 hours later.  1 Inhaler  11  . Multiple Vitamins-Minerals (MULTIVITAMINS THER. W/MINERALS) TABS Take 1 tablet by mouth daily.        . nitroGLYCERIN (NITROSTAT) 0.4 MG SL tablet Place 1 tablet (0.4 mg total) under the tongue every 5 (five) minutes as needed for chest pain.  25 tablet  prn  . OVER THE COUNTER MEDICATION Take 1 tablet by mouth daily. Leg cramps      . vitamin C (ASCORBIC ACID) 500 MG tablet Take 500 mg by mouth 2 (two) times daily.        . zafirlukast (ACCOLATE) 20 MG tablet Take 20 mg by mouth 2 (two) times daily.         No current facility-administered medications on file prior to visit.   Allergies  Allergen Reactions  . Adhesive [Tape] Other (See Comments)    Skin turns red   . Diphenhydramine Other (See Comments)    Pt feels wired   . Vicodin  [Hydrocodone-Acetaminophen] Nausea And Vomiting   History   Social History  . Marital Status: Married    Spouse Name: N/A    Number of Children: 2  . Years of Education: N/A   Occupational History  . retired     post Nurse, mental health   Social History Main Topics  . Smoking status: Never Smoker   . Smokeless tobacco: Never Used  . Alcohol Use: No  . Drug Use: No  . Sexual Activity: No   Other Topics Concern  . Not on file   Social History Narrative  . No narrative on file     Review of Systems  All other systems reviewed and are negative.       Objective:   Physical Exam  Vitals reviewed. Cardiovascular: Normal rate, regular rhythm and normal heart sounds.   No murmur heard. Pulmonary/Chest: Effort normal and breath sounds normal. No respiratory distress. She has no wheezes. She has no rales.  Abdominal: Soft. She exhibits no distension and no mass. There is tenderness (To palpation in the left lower quadrant.). There is no rebound and no guarding.   patient also has hypoactive bowel sounds        Assessment & Plan:  1. Diverticulitis of colon (without mention of hemorrhage) Clinically I believe the patient may be developing diverticulitis. Begin Cipro 500 mg by mouth twice a day for 10 days. Begin Flagyl 500 mg by mouth 3 times a day for 10 days. Recheck here in 48-72 hours. Come in sooner if she is getting worse. If symptoms worsen drastically, go to the emergency room for a CT scan to rule out worsening diverticulitis or other causes of an acute abdomen such as appendicitis.   - ciprofloxacin (CIPRO) 500 MG tablet; Take 1 tablet (500 mg total) by mouth 2 (two) times daily.  Dispense: 20 tablet; Refill: 0 - metroNIDAZOLE (FLAGYL) 500 MG tablet; Take 1 tablet (500 mg total) by mouth 3 (three) times daily.  Dispense: 30 tablet; Refill: 0

## 2013-06-16 ENCOUNTER — Encounter: Payer: Self-pay | Admitting: Family Medicine

## 2013-06-16 ENCOUNTER — Ambulatory Visit (INDEPENDENT_AMBULATORY_CARE_PROVIDER_SITE_OTHER): Payer: Medicare Other | Admitting: Family Medicine

## 2013-06-16 ENCOUNTER — Ambulatory Visit
Admission: RE | Admit: 2013-06-16 | Discharge: 2013-06-16 | Disposition: A | Discharge status: Home / Self Care | Payer: Medicare Other | Source: Ambulatory Visit | Attending: Family Medicine | Admitting: Family Medicine

## 2013-06-16 VITALS — BP 110/80 | HR 80 | Temp 98.2°F | Resp 18 | Wt 183.0 lb

## 2013-06-16 DIAGNOSIS — R0609 Other forms of dyspnea: Secondary | ICD-10-CM

## 2013-06-16 DIAGNOSIS — K5732 Diverticulitis of large intestine without perforation or abscess without bleeding: Secondary | ICD-10-CM

## 2013-06-16 DIAGNOSIS — R0600 Dyspnea, unspecified: Secondary | ICD-10-CM

## 2013-06-16 DIAGNOSIS — R0602 Shortness of breath: Secondary | ICD-10-CM | POA: Diagnosis not present

## 2013-06-16 NOTE — Progress Notes (Signed)
Subjective:    Patient ID: [name], female    DOB: 03-08-1946, 67 y.o.   MRN: [id number]  HPI  06/13/13 Patient presents with lower abdominal pain. She was constipated all last week while on vacation. Beginning Saturday she started experiencing left lower quadrant abdominal pain. The pain is 5/10. She denies any fevers nausea or vomiting. The constipation has resolved. She is now having one to 2 loose stools per day. She denies any hematochezia or melena. She denies any upper abdominal pain or right upper quadrant pain. She denies any right lower quadrant pain. She denies any dysuria, hematuria, urgency. She denies any vaginal discharge or vaginal bleeding.  At that time, my plan was: 1. Diverticulitis of colon (without mention of hemorrhage) Clinically I believe the patient may be developing diverticulitis. Begin Cipro 500 mg by mouth twice a day for 10 days. Begin Flagyl 500 mg by mouth 3 times a day for 10 days. Recheck here in 48-72 hours. Come in sooner if she is getting worse. If symptoms worsen drastically, go to the emergency room for a CT scan to rule out worsening diverticulitis or other causes of an acute abdomen such as appendicitis.   - ciprofloxacin (CIPRO) 500 MG tablet; Take 1 tablet (500 mg total) by mouth 2 (two) times daily.  Dispense: 20 tablet; Refill: 0 - metroNIDAZOLE (FLAGYL) 500 MG tablet; Take 1 tablet (500 mg total) by mouth 3 (three) times daily.  Dispense: 30 tablet; Refill: 0  06/16/13 Patient is here today for recheck. Her left lower quadrant bowel pain has improved dramatically. She is having less diarrhea. Her symptoms haven't process 75%. She denies hematochezia. She denies nausea vomiting. She denies melena. She is no longer having right lower quadrant abdominal pain. She is tolerating antibiotics well.  She also reports several months of increasing dyspnea on exertion. Labwork has ruled out anemia. She has a history of asthma but she is not using Dulera  regularly because she does not feel that the medication helps. She reports dyspnea walking up is. She reports dyspnea on exertion working in the garden. She has seen her cardiologist and they have ruled out a cardiac etiology.  She does have a history of nonischemic cardiomyopathy.  She underwent an echocardiogram in July which revealed: Study Conclusions  - Left ventricle: The cavity size was mildly dilated. Systolic function was normal. The estimated ejection fraction was in the range of 50% to 55%. - Left atrium: The atrium was mildly dilated. - Atrial septum: No defect or patent foramen ovale was identified.   Past Medical History  Diagnosis Date  . Cancer     breast ca  . Asthma   . GERD (gastroesophageal reflux disease)   . DM2 (diabetes mellitus, type 2)   . Arthritis   . Allergy history unknown   . Hiatal hernia   . Leg cramps   . Hypertension   . Elevated cholesterol   . NICM (nonischemic cardiomyopathy)     a. Echo:  06/14/12: Poor endocardial definition, possible septal HK, EF 50-55%, normal wall motion, mild LAE ;  b.  [facility] 9/13:  normal cors, EF 35-40%,  c. Echo 7/14: EF 50-55%, mild LAE  . LBBB (left bundle branch block) 10/27/2012  . Breast cancer, left breast 10/27/2012    3.2 cm ER positive, Her-2 negative, 1 node positive S/P mastectomy/TTRAM reconstruction 4/08  . Breast cancer, right breast 10/27/2012    3.2 cm ER/PR positive, 1 node positive, Her-2 negative Rx w  mastectomy, AC -T chemo, followed by aromatase inhibitor x 5 years dx 4/08  . Diabetes    Current Outpatient Prescriptions on File Prior to Visit  Medication Sig Dispense Refill  . aspirin EC 81 MG tablet Take 81 mg by mouth daily.        Marland Kitchen atorvastatin (LIPITOR) 20 MG tablet Take 40 mg by mouth every evening.       . Calcium Carbonate-Vitamin D (CALCIUM-VITAMIN D) 500-200 MG-UNIT per tablet Take 1 tablet by mouth 2 (two) times daily with a meal.       . carvedilol (COREG) 6.25 MG tablet TAKE 1 TABLET  (6.25 MG TOTAL) BY MOUTH 2 (TWO) TIMES DAILY WITH A MEAL.  60 tablet  5  . cetirizine (ZYRTEC) 10 MG tablet Take 10 mg by mouth every evening.      Marland Kitchen CINNAMON PO Take 1 tablet by mouth daily.        . ciprofloxacin (CIPRO) 500 MG tablet Take 1 tablet (500 mg total) by mouth 2 (two) times daily.  20 tablet  0  . Coenzyme Q10 (CO Q 10 PO) Take 1 capsule by mouth daily.        . cyanocobalamin 500 MCG tablet Take 500 mcg by mouth daily. 10/25/12--Pt not sure of dose      . DIOVAN 160 MG tablet TAKE 1 AND 1/2 TABLETS (TOTAL 240MG ) DAILY  45 tablet  6  . esomeprazole (NEXIUM) 40 MG capsule Take 1 capsule (40 mg total) by mouth 2 (two) times daily.  60 capsule  5  . fluticasone (FLONASE) 50 MCG/ACT nasal spray Place 2 sprays into the nose 2 (two) times daily as needed. allergies       . Glucosamine-Chondroitin (GLUCOSAMINE CHONDR COMPLEX PO) Take 1 tablet by mouth daily.        Marland Kitchen glucose blood (ACCU-CHEK COMFORT CURVE) test strip Check bs bid  180 each  11  . metFORMIN (GLUCOPHAGE-XR) 500 MG 24 hr tablet Take 1 tablet (500 mg total) by mouth every evening.  90 tablet  1  . metroNIDAZOLE (FLAGYL) 500 MG tablet Take 1 tablet (500 mg total) by mouth 3 (three) times daily.  30 tablet  0  . mometasone-formoterol (DULERA) 100-5 MCG/ACT AERO Take 2 puffs first thing in am and then another 2 puffs about 12 hours later.  1 Inhaler  11  . Multiple Vitamins-Minerals (MULTIVITAMINS THER. W/MINERALS) TABS Take 1 tablet by mouth daily.        . nitroGLYCERIN (NITROSTAT) 0.4 MG SL tablet Place 1 tablet (0.4 mg total) under the tongue every 5 (five) minutes as needed for chest pain.  25 tablet  prn  . OVER THE COUNTER MEDICATION Take 1 tablet by mouth daily. Leg cramps      . vitamin C (ASCORBIC ACID) 500 MG tablet Take 500 mg by mouth 2 (two) times daily.        . zafirlukast (ACCOLATE) 20 MG tablet Take 20 mg by mouth 2 (two) times daily.         No current facility-administered medications on file prior to visit.    Allergies  Allergen Reactions  . Adhesive [Tape] Other (See Comments)    Skin turns red   . Diphenhydramine Other (See Comments)    Pt feels wired   . Vicodin [Hydrocodone-Acetaminophen] Nausea And Vomiting   History   Social History  . Marital Status: Married    Spouse Name: N/A    Number of Children: 2  .  Years of Education: N/A   Occupational History  . retired     post Nurse, mental health   Social History Main Topics  . Smoking status: Never Smoker   . Smokeless tobacco: Never Used  . Alcohol Use: No  . Drug Use: No  . Sexual Activity: No   Other Topics Concern  . Not on file   Social History Narrative  . No narrative on file     Review of Systems  All other systems reviewed and are negative.       Objective:   Physical Exam  Vitals reviewed. Cardiovascular: Normal rate, regular rhythm and normal heart sounds.   No murmur heard. Pulmonary/Chest: Effort normal and breath sounds normal. No respiratory distress. She has no wheezes. She has no rales.  Abdominal: Soft. She exhibits no distension and no mass. There is no tenderness. There is no rebound and no guarding.           Assessment & Plan:  1. Dyspnea I believe this is most likely due to her asthma and her irregular use of her preventative medication. There may also be a component of gastroesophageal reflux disease and her hiatal hernia contributing to her asthma. For now we will maximize therapy for her asthma by switching her from [facility] to Symbicort 160 mg/4.5 mg 2 puffs inhaled twice a day when necessary.  Also obtain a chest x-ray. She is to follow up with me in 2-3 weeks to see if she is noticing improvement. If her symptoms persist we may also want to consider a GI consultation and possible 24 hr ambulatory manometry. - DG Chest 2 View; Future  2. Diverticulitis of colon without hemorrhage Diverticulitis seems to be clinically improving. Complete Cipro and Flagyl.

## 2013-06-19 DIAGNOSIS — Z23 Encounter for immunization: Secondary | ICD-10-CM | POA: Diagnosis not present

## 2013-06-26 ENCOUNTER — Telehealth: Payer: Self-pay | Admitting: Family Medicine

## 2013-06-26 ENCOUNTER — Other Ambulatory Visit: Payer: Self-pay | Admitting: Family Medicine

## 2013-06-26 DIAGNOSIS — K5732 Diverticulitis of large intestine without perforation or abscess without bleeding: Secondary | ICD-10-CM

## 2013-06-26 NOTE — Telephone Encounter (Signed)
Pt said that she is not any better. The abx made the pain go away, but that is about it. She wants to know if you still want to do the CT scan. If so, she is willing to do it.

## 2013-06-26 NOTE — Telephone Encounter (Signed)
Start Restora OTC 1 poqday for diarrhea.  Meanwhile, I will schedule CT.

## 2013-06-27 NOTE — Telephone Encounter (Signed)
CT is scheduled for 05/29/13. We just need to send in medication.

## 2013-06-28 MED ORDER — RESTORA PO CAPS: ORAL_CAPSULE | ORAL | Status: DC

## 2013-06-28 NOTE — Telephone Encounter (Signed)
rx sent

## 2013-06-29 ENCOUNTER — Inpatient Hospital Stay: Admission: RE | Admit: 2013-06-29 | Payer: Medicare Other | Source: Ambulatory Visit

## 2013-06-29 ENCOUNTER — Ambulatory Visit
Admission: RE | Admit: 2013-06-29 | Discharge: 2013-06-29 | Disposition: A | Discharge status: Home / Self Care | Payer: Medicare Other | Source: Ambulatory Visit | Attending: Family Medicine | Admitting: Family Medicine

## 2013-06-29 DIAGNOSIS — K5732 Diverticulitis of large intestine without perforation or abscess without bleeding: Secondary | ICD-10-CM

## 2013-06-29 DIAGNOSIS — K5730 Diverticulosis of large intestine without perforation or abscess without bleeding: Secondary | ICD-10-CM | POA: Diagnosis not present

## 2013-06-29 MED ORDER — IOHEXOL 300 MG/ML  SOLN
100.0000 mL | Freq: Once | INTRAMUSCULAR | Status: AC | PRN
  Administered 2013-06-29: 100 mL via INTRAVENOUS

## 2013-07-04 ENCOUNTER — Ambulatory Visit (INDEPENDENT_AMBULATORY_CARE_PROVIDER_SITE_OTHER): Payer: Medicare Other | Admitting: Family Medicine

## 2013-07-04 ENCOUNTER — Encounter: Payer: Self-pay | Admitting: Family Medicine

## 2013-07-04 VITALS — BP 110/70 | HR 86 | Temp 98.4°F | Resp 18 | Ht 62.0 in | Wt 181.0 lb

## 2013-07-04 DIAGNOSIS — R197 Diarrhea, unspecified: Secondary | ICD-10-CM

## 2013-07-04 NOTE — Progress Notes (Signed)
Subjective:    Patient ID: [name], female    DOB: January 18, 1946, 67 y.o.   MRN: [id number]  HPI  06/12/13 Patient presents with lower abdominal pain. She was constipated all last week while on vacation. Beginning Saturday she started experiencing left lower quadrant abdominal pain. The pain is 5/10. She denies any fevers nausea or vomiting. The constipation has resolved. She is now having one to 2 loose stools per day. She denies any hematochezia or melena. She denies any upper abdominal pain or right upper quadrant pain. She denies any right lower quadrant pain. She denies any dysuria, hematuria, urgency. She denies any vaginal discharge or vaginal bleeding.  At that time, my plan was: 1. Diverticulitis of colon (without mention of hemorrhage) Clinically I believe the patient may be developing diverticulitis. Begin Cipro 500 mg by mouth twice a day for 10 days. Begin Flagyl 500 mg by mouth 3 times a day for 10 days. Recheck here in 48-72 hours. Come in sooner if she is getting worse. If symptoms worsen drastically, go to the emergency room for a CT scan to rule out worsening diverticulitis or other causes of an acute abdomen such as appendicitis.   - ciprofloxacin (CIPRO) 500 MG tablet; Take 1 tablet (500 mg total) by mouth 2 (two) times daily.  Dispense: 20 tablet; Refill: 0 - metroNIDAZOLE (FLAGYL) 500 MG tablet; Take 1 tablet (500 mg total) by mouth 3 (three) times daily.  CT scan was obtained which revealed: Diverticulosis of the distal colon. No acute abnormalities.  07/04/13 Patient states that she has had severe diarrhea ever since beginning the antibiotics. She is going to the restroom 5-6 times a day. Is extremely loose. She denies any blood in the stool. The left lower quadrant abdominal pain has completely subsided now after the antibiotics. The diarrhea did not begin until she began antibiotics. She never filled the prescription for the probiotics which I gave her. At the same time  she is also increased her Nexium which could be contributing to the diarrhea. She has no known exposure to Clostridium difficile. She's had no travel out of the country.  Her thyroid was checked in July and was normal. She is also taking metformin although she has been on this for years without  Past Medical History  Diagnosis Date  . Cancer     breast ca  . Asthma   . GERD (gastroesophageal reflux disease)   . DM2 (diabetes mellitus, type 2)   . Arthritis   . Allergy history unknown   . Hiatal hernia   . Leg cramps   . Hypertension   . Elevated cholesterol   . NICM (nonischemic cardiomyopathy)     a. Echo:  06/14/12: Poor endocardial definition, possible septal HK, EF 50-55%, normal wall motion, mild LAE ;  b.  [facility] 9/13:  normal cors, EF 35-40%,  c. Echo 7/14: EF 50-55%, mild LAE  . LBBB (left bundle branch block) 10/27/2012  . Breast cancer, left breast 10/27/2012    3.2 cm ER positive, Her-2 negative, 1 node positive S/P mastectomy/TTRAM reconstruction 4/08  . Breast cancer, right breast 10/27/2012    3.2 cm ER/PR positive, 1 node positive, Her-2 negative Rx w mastectomy, AC -T chemo, followed by aromatase inhibitor x 5 years dx 4/08  . Diabetes    Current Outpatient Prescriptions on File Prior to Visit  Medication Sig Dispense Refill  . aspirin EC 81 MG tablet Take 81 mg by mouth daily.        Marland Kitchen  atorvastatin (LIPITOR) 20 MG tablet Take 40 mg by mouth every evening.       . Calcium Carbonate-Vitamin D (CALCIUM-VITAMIN D) 500-200 MG-UNIT per tablet Take 1 tablet by mouth 2 (two) times daily with a meal.       . carvedilol (COREG) 6.25 MG tablet TAKE 1 TABLET (6.25 MG TOTAL) BY MOUTH 2 (TWO) TIMES DAILY WITH A MEAL.  60 tablet  5  . cetirizine (ZYRTEC) 10 MG tablet Take 10 mg by mouth every evening.      Marland Kitchen CINNAMON PO Take 1 tablet by mouth daily.        . Coenzyme Q10 (CO Q 10 PO) Take 1 capsule by mouth daily.        . cyanocobalamin 500 MCG tablet Take 500 mcg by mouth daily.  10/25/12--Pt not sure of dose      . DIOVAN 160 MG tablet TAKE 1 AND 1/2 TABLETS (TOTAL 240MG ) DAILY  45 tablet  6  . esomeprazole (NEXIUM) 40 MG capsule Take 1 capsule (40 mg total) by mouth 2 (two) times daily.  60 capsule  5  . fluticasone (FLONASE) 50 MCG/ACT nasal spray Place 2 sprays into the nose 2 (two) times daily as needed. allergies       . Glucosamine-Chondroitin (GLUCOSAMINE CHONDR COMPLEX PO) Take 1 tablet by mouth daily.        Marland Kitchen glucose blood (ACCU-CHEK COMFORT CURVE) test strip Check bs bid  180 each  11  . metFORMIN (GLUCOPHAGE-XR) 500 MG 24 hr tablet Take 1 tablet (500 mg total) by mouth every evening.  90 tablet  1  . mometasone-formoterol (DULERA) 100-5 MCG/ACT AERO Take 2 puffs first thing in am and then another 2 puffs about 12 hours later.  1 Inhaler  11  . Multiple Vitamins-Minerals (MULTIVITAMINS THER. W/MINERALS) TABS Take 1 tablet by mouth daily.        . nitroGLYCERIN (NITROSTAT) 0.4 MG SL tablet Place 1 tablet (0.4 mg total) under the tongue every 5 (five) minutes as needed for chest pain.  25 tablet  prn  . OVER THE COUNTER MEDICATION Take 1 tablet by mouth daily. Leg cramps      . Probiotic Product (RESTORA) CAPS 1 cap po qd  30 capsule  11  . vitamin C (ASCORBIC ACID) 500 MG tablet Take 500 mg by mouth 2 (two) times daily.        . zafirlukast (ACCOLATE) 20 MG tablet Take 20 mg by mouth 2 (two) times daily.         No current facility-administered medications on file prior to visit.   Allergies  Allergen Reactions  . Adhesive [Tape] Other (See Comments)    Skin turns red   . Diphenhydramine Other (See Comments)    Pt feels wired   . Vicodin [Hydrocodone-Acetaminophen] Nausea And Vomiting   History   Social History  . Marital Status: Married    Spouse Name: N/A    Number of Children: 2  . Years of Education: N/A   Occupational History  . retired     post Nurse, mental health   Social History Main Topics  . Smoking status: Never Smoker   .  Smokeless tobacco: Never Used  . Alcohol Use: No  . Drug Use: No  . Sexual Activity: No   Other Topics Concern  . Not on file   Social History Narrative  . No narrative on file     Review of Systems  All other systems reviewed and  are negative.       Objective:   Physical Exam  Vitals reviewed. Cardiovascular: Normal rate, regular rhythm and normal heart sounds.   No murmur heard. Pulmonary/Chest: Effort normal and breath sounds normal. No respiratory distress. She has no wheezes. She has no rales.  Abdominal: Soft. She exhibits no distension and no mass. There is no tenderness. There is no rebound and no guarding.          Assessment & Plan:   1. Diarrhea I think this is likely due to the antibiotics. I instructed the patient to begin taking a probiotic once a day. I've also asked her to begin taking Imodium as needed for the diarrhea.  I also asked her to decrease Nexium to once a day. Recheck in one week. If she continues to have diarrhea I recommend stool cultures, stool O&P, and C. difficile assays.

## 2013-07-05 DIAGNOSIS — Z124 Encounter for screening for malignant neoplasm of cervix: Secondary | ICD-10-CM | POA: Diagnosis not present

## 2013-07-05 DIAGNOSIS — Z01419 Encounter for gynecological examination (general) (routine) without abnormal findings: Secondary | ICD-10-CM | POA: Diagnosis not present

## 2013-08-03 ENCOUNTER — Other Ambulatory Visit: Payer: Self-pay | Admitting: Cardiology

## 2013-08-07 ENCOUNTER — Telehealth: Payer: Self-pay | Admitting: Family Medicine

## 2013-08-07 MED ORDER — SUCRALFATE 1 G PO TABS: 1.0000 g | ORAL_TABLET | Freq: Four times a day (QID) | ORAL | Status: DC

## 2013-08-07 NOTE — Telephone Encounter (Signed)
Pt has appt tomorrow but she has been having pain on her left side near her rib cage area. It gets better then resurfaces. She wants to know what to do for the pain through the night until she can get in to see you tomorrow. Also her stomach has been burning really bad. She is taking her Nexium and some mylanta to help with the burning sensation.

## 2013-08-07 NOTE — Telephone Encounter (Signed)
Per wtp can try Carafate until seen at [facility] tomorrow but if pin is severe then she needs to go to er.

## 2013-08-07 NOTE — Telephone Encounter (Signed)
.  Patient aware and medication sent to pharm

## 2013-08-08 ENCOUNTER — Encounter: Payer: Self-pay | Admitting: Family Medicine

## 2013-08-08 ENCOUNTER — Other Ambulatory Visit: Payer: Self-pay | Admitting: Cardiology

## 2013-08-08 ENCOUNTER — Ambulatory Visit (INDEPENDENT_AMBULATORY_CARE_PROVIDER_SITE_OTHER): Payer: Medicare Other | Admitting: Family Medicine

## 2013-08-08 VITALS — BP 100/60 | HR 76 | Temp 98.0°F | Resp 20 | Ht 62.0 in | Wt 178.0 lb

## 2013-08-08 DIAGNOSIS — K2970 Gastritis, unspecified, without bleeding: Secondary | ICD-10-CM | POA: Diagnosis not present

## 2013-08-08 DIAGNOSIS — R1012 Left upper quadrant pain: Secondary | ICD-10-CM | POA: Diagnosis not present

## 2013-08-08 LAB — CBC W/MCH & 3 PART DIFF
HCT: 41.5 % (ref 36.0–46.0)
Hemoglobin: 14 g/dL (ref 12.0–15.0)
Lymphocytes Relative: 38 % (ref 12–46)
Lymphs Abs: 2.5 10*3/uL (ref 0.7–4.0)
MCV: 95.4 fL (ref 78.0–100.0)
RBC: 4.35 MIL/uL (ref 3.87–5.11)
WBC mixed population: 0.3 10*3/uL (ref 0.1–1.8)
WBC: 6.5 10*3/uL (ref 4.0–10.5)

## 2013-08-08 LAB — COMPLETE METABOLIC PANEL WITH GFR
ALT: 22 U/L (ref 0–35)
AST: 15 U/L (ref 0–37)
Alkaline Phosphatase: 88 U/L (ref 39–117)
Chloride: 103 mEq/L (ref 96–112)
Creat: 0.73 mg/dL (ref 0.50–1.10)
Total Bilirubin: 0.4 mg/dL (ref 0.3–1.2)

## 2013-08-08 MED ORDER — DEXLANSOPRAZOLE 60 MG PO CPDR: 60.0000 mg | DELAYED_RELEASE_CAPSULE | Freq: Every day | ORAL | Status: DC

## 2013-08-08 NOTE — Addendum Note (Signed)
Addended by: Legrand Rams B on: 08/08/2013 11:24 AM   Modules accepted: Orders

## 2013-08-08 NOTE — Progress Notes (Signed)
Subjective:    Patient ID: [name], female    DOB: 08/10/1946, 67 y.o.   MRN: [id number]  HPI  06/12/13 Patient presents with lower abdominal pain. She was constipated all last week while on vacation. Beginning Saturday she started experiencing left lower quadrant abdominal pain. The pain is 5/10. She denies any fevers nausea or vomiting. The constipation has resolved. She is now having one to 2 loose stools per day. She denies any hematochezia or melena. She denies any upper abdominal pain or right upper quadrant pain. She denies any right lower quadrant pain. She denies any dysuria, hematuria, urgency. She denies any vaginal discharge or vaginal bleeding.  At that time, my plan was: 1. Diverticulitis of colon (without mention of hemorrhage) Clinically I believe the patient may be developing diverticulitis. Begin Cipro 500 mg by mouth twice a day for 10 days. Begin Flagyl 500 mg by mouth 3 times a day for 10 days. Recheck here in 48-72 hours. Come in sooner if she is getting worse. If symptoms worsen drastically, go to the emergency room for a CT scan to rule out worsening diverticulitis or other causes of an acute abdomen such as appendicitis.   - ciprofloxacin (CIPRO) 500 MG tablet; Take 1 tablet (500 mg total) by mouth 2 (two) times daily.  Dispense: 20 tablet; Refill: 0 - metroNIDAZOLE (FLAGYL) 500 MG tablet; Take 1 tablet (500 mg total) by mouth 3 (three) times daily.  CT scan was obtained which revealed: Diverticulosis of the distal colon. No acute abnormalities.  07/04/13 Patient states that she has had severe diarrhea ever since beginning the antibiotics. She is going to the restroom 5-6 times a day. Is extremely loose. She denies any blood in the stool. The left lower quadrant abdominal pain has completely subsided now after the antibiotics. The diarrhea did not begin until she began antibiotics. She never filled the prescription for the probiotics which I gave her. At the same time  she is also increased her Nexium which could be contributing to the diarrhea. She has no known exposure to Clostridium difficile. She's had no travel out of the country.  Her thyroid was checked in July and was normal. She is also taking metformin although she has been on this for years without problems.  At that time, my plan was:  1. Diarrhea I think this is likely due to the antibiotics. I instructed the patient to begin taking a probiotic once a day. I've also asked her to begin taking Imodium as needed for the diarrhea.  I also asked her to decrease Nexium to once a day. Recheck in one week. If she continues to have diarrhea I recommend stool cultures, stool O&P, and C. difficile assays.  08/08/13 Patient reports left upper quadrant abdominal pain that is burning in nature. It comes and goes in spasms. This began approximate 3 days ago. She denies any melena or hematochezia. She denies any fevers. There are no exacerbating factors. No alleviating factors. She denies any hematuria. She denies any dysuria. She denies any nausea vomiting constipation or diarrhea. She denies any cough. She denies any angina. She denies any chest pain or shortness of breath. However the pain in her left upper quadrant is intense.  She has a history of a hiatal hernia as well as refractory reflux.    Past Medical History  Diagnosis Date  . Cancer     breast ca  . Asthma   . GERD (gastroesophageal reflux disease)   . DM2 (  diabetes mellitus, type 2)   . Arthritis   . Allergy history unknown   . Hiatal hernia   . Leg cramps   . Hypertension   . Elevated cholesterol   . NICM (nonischemic cardiomyopathy)     a. Echo:  06/14/12: Poor endocardial definition, possible septal HK, EF 50-55%, normal wall motion, mild LAE ;  b.  [facility] 9/13:  normal cors, EF 35-40%,  c. Echo 7/14: EF 50-55%, mild LAE  . LBBB (left bundle branch block) 10/27/2012  . Breast cancer, left breast 10/27/2012    3.2 cm ER positive, Her-2 negative, 1  node positive S/P mastectomy/TTRAM reconstruction 4/08  . Breast cancer, right breast 10/27/2012    3.2 cm ER/PR positive, 1 node positive, Her-2 negative Rx w mastectomy, AC -T chemo, followed by aromatase inhibitor x 5 years dx 4/08  . Diabetes    Current Outpatient Prescriptions on File Prior to Visit  Medication Sig Dispense Refill  . aspirin EC 81 MG tablet Take 81 mg by mouth daily.        Marland Kitchen atorvastatin (LIPITOR) 20 MG tablet Take 40 mg by mouth every evening.       . Calcium Carbonate-Vitamin D (CALCIUM-VITAMIN D) 500-200 MG-UNIT per tablet Take 1 tablet by mouth 2 (two) times daily with a meal.       . carvedilol (COREG) 6.25 MG tablet TAKE 1 TABLET (6.25 MG TOTAL) BY MOUTH 2 (TWO) TIMES DAILY WITH A MEAL.  60 tablet  5  . cetirizine (ZYRTEC) 10 MG tablet Take 10 mg by mouth every evening.      Marland Kitchen CINNAMON PO Take 1 tablet by mouth daily.        . Coenzyme Q10 (CO Q 10 PO) Take 1 capsule by mouth daily.        . cyanocobalamin 500 MCG tablet Take 500 mcg by mouth daily. 10/25/12--Pt not sure of dose      . esomeprazole (NEXIUM) 40 MG capsule Take 1 capsule (40 mg total) by mouth 2 (two) times daily.  60 capsule  5  . fluticasone (FLONASE) 50 MCG/ACT nasal spray Place 2 sprays into the nose 2 (two) times daily as needed. allergies       . Glucosamine-Chondroitin (GLUCOSAMINE CHONDR COMPLEX PO) Take 1 tablet by mouth daily.        Marland Kitchen glucose blood (ACCU-CHEK COMFORT CURVE) test strip Check bs bid  180 each  11  . metFORMIN (GLUCOPHAGE-XR) 500 MG 24 hr tablet Take 1 tablet (500 mg total) by mouth every evening.  90 tablet  1  . mometasone-formoterol (DULERA) 100-5 MCG/ACT AERO Take 2 puffs first thing in am and then another 2 puffs about 12 hours later.  1 Inhaler  11  . Multiple Vitamins-Minerals (MULTIVITAMINS THER. W/MINERALS) TABS Take 1 tablet by mouth daily.        . nitroGLYCERIN (NITROSTAT) 0.4 MG SL tablet Place 1 tablet (0.4 mg total) under the tongue every 5 (five) minutes as  needed for chest pain.  25 tablet  prn  . OVER THE COUNTER MEDICATION Take 1 tablet by mouth daily. Leg cramps      . Probiotic Product (RESTORA) CAPS 1 cap po qd  30 capsule  11  . sucralfate (CARAFATE) 1 G tablet Take 1 tablet (1 g total) by mouth 4 (four) times daily.  60 tablet  0  . valsartan (DIOVAN) 160 MG tablet TAKE 1 AND 1/2 TABLETS (TOTAL 240MG ) DAILY  45 tablet  0  .  vitamin C (ASCORBIC ACID) 500 MG tablet Take 500 mg by mouth 2 (two) times daily.        . zafirlukast (ACCOLATE) 20 MG tablet Take 20 mg by mouth 2 (two) times daily.         No current facility-administered medications on file prior to visit.   Allergies  Allergen Reactions  . Adhesive [Tape] Other (See Comments)    Skin turns red   . Diphenhydramine Other (See Comments)    Pt feels wired   . Vicodin [Hydrocodone-Acetaminophen] Nausea And Vomiting   History   Social History  . Marital Status: Married    Spouse Name: N/A    Number of Children: 2  . Years of Education: N/A   Occupational History  . retired     post Nurse, mental health   Social History Main Topics  . Smoking status: Never Smoker   . Smokeless tobacco: Never Used  . Alcohol Use: No  . Drug Use: No  . Sexual Activity: No   Other Topics Concern  . Not on file   Social History Narrative  . No narrative on file     Review of Systems  All other systems reviewed and are negative.       Objective:   Physical Exam  Vitals reviewed. Cardiovascular: Normal rate, regular rhythm and normal heart sounds.   No murmur heard. Pulmonary/Chest: Effort normal and breath sounds normal. No respiratory distress. She has no wheezes. She has no rales.  Abdominal: Soft. She exhibits no distension and no mass. There is no tenderness. There is no rebound and no guarding.          Assessment & Plan:   1. Gastritis I am concerned that patient may have gastritis or possibly an ulcer. Discontinue Nexium. Start Carafate 1 g by mouth q.  a.c. at bedtime. Start dexilant 60 mg poqday .  Recheck in 3-4 days or sooner if worse.  Given the recurrent nature of her left sided abd discomfort, I will consult GI for possible egd. - dexlansoprazole (DEXILANT) 60 MG capsule; Take 1 capsule (60 mg total) by mouth daily.  Dispense: 30 capsule; Refill: 1  2. Abdominal pain, left upper quadrant - CBC with Differential - COMPLETE METABOLIC PANEL WITH GFR - Lipase

## 2013-08-10 DIAGNOSIS — R1012 Left upper quadrant pain: Secondary | ICD-10-CM | POA: Diagnosis not present

## 2013-08-11 DIAGNOSIS — R1012 Left upper quadrant pain: Secondary | ICD-10-CM | POA: Diagnosis not present

## 2013-08-11 DIAGNOSIS — K3189 Other diseases of stomach and duodenum: Secondary | ICD-10-CM | POA: Diagnosis not present

## 2013-08-11 DIAGNOSIS — K219 Gastro-esophageal reflux disease without esophagitis: Secondary | ICD-10-CM | POA: Diagnosis not present

## 2013-08-14 ENCOUNTER — Other Ambulatory Visit: Payer: Self-pay | Admitting: Family Medicine

## 2013-09-01 ENCOUNTER — Other Ambulatory Visit: Payer: Self-pay | Admitting: Family Medicine

## 2013-09-01 MED ORDER — ONETOUCH ULTRA 2 W/DEVICE KIT: PACK | Status: DC

## 2013-09-01 NOTE — Telephone Encounter (Signed)
Glucose monitor sent to pharm

## 2013-09-04 ENCOUNTER — Telehealth: Payer: Self-pay | Admitting: Family Medicine

## 2013-09-04 NOTE — Telephone Encounter (Signed)
?   OK to Refill  

## 2013-09-04 NOTE — Telephone Encounter (Signed)
Pt is calling because she was suppose to have seen Dr Randa Evens At [facility] on 12-4 from a referral that we had sent. However she had seen Dr Randa Evens on 11-6 and had an endoscopy on 11-7 before she had went on vacation. She is calling today to see if she should have not cancelled her apt on 12-4 with dr Randa Evens.  Call back number is [phone]

## 2013-09-05 NOTE — Telephone Encounter (Signed)
Dr. Randa Evens wanted her to follow up after the endoscopy.  I am not sure when.  She should call their office and verify with him when her next follow up appointment should be.

## 2013-09-05 NOTE — Telephone Encounter (Signed)
Patient aware.

## 2013-09-13 ENCOUNTER — Other Ambulatory Visit: Payer: Self-pay | Admitting: Gastroenterology

## 2013-09-13 DIAGNOSIS — R109 Unspecified abdominal pain: Secondary | ICD-10-CM

## 2013-09-13 DIAGNOSIS — R110 Nausea: Secondary | ICD-10-CM | POA: Diagnosis not present

## 2013-09-13 DIAGNOSIS — R197 Diarrhea, unspecified: Secondary | ICD-10-CM | POA: Diagnosis not present

## 2013-09-14 ENCOUNTER — Ambulatory Visit (INDEPENDENT_AMBULATORY_CARE_PROVIDER_SITE_OTHER): Payer: Medicare Other | Admitting: Cardiology

## 2013-09-14 ENCOUNTER — Encounter: Payer: Self-pay | Admitting: Cardiology

## 2013-09-14 VITALS — BP 124/90 | HR 72 | Ht 62.5 in | Wt 180.1 lb

## 2013-09-14 DIAGNOSIS — E785 Hyperlipidemia, unspecified: Secondary | ICD-10-CM

## 2013-09-14 DIAGNOSIS — I428 Other cardiomyopathies: Secondary | ICD-10-CM

## 2013-09-14 DIAGNOSIS — I447 Left bundle-branch block, unspecified: Secondary | ICD-10-CM

## 2013-09-14 NOTE — Progress Notes (Signed)
Patient ID: [name], female   DOB: [birth year], 67 y.o.   MRN: [id number] PCP: Dr. Tanya Nones  67 yo with history of HTN and type II diabetes presents for evaluation of nonischemic cardiomyopathy.  Back in 2009, she had an echo with normal EF.  She was admitted in 9/13 with chest pain and LBBB.  She had LHC showing no CAD but EF 35-40%.  Echo was a technically difficult study with EF reported as 50-55%.  She has been taking ARB and Coreg.  Repeat echo in 12/13 showed EF 40% with septal hypokinesis.  Cardiac MRI was done in 12/13 as well, with calculated EF 50%, global hypokinesis, and no delayed enhancement.  Most recent echo in 7/14 showed EF 50-55% with mild LV dilation and mild LVH.   She can walk on flat ground without exertional dyspnea.  She has mild shortness of breath with steps.  No chest pain.  No tachypalpitations.  Today, ECG shows IVCD.  Her main complaint has been abdominal cramping.  She is seeing GI.   Labs (9/13): TSH 4.884 (elevated), proBNP 155 Labs (10/13): K 3.6, creatinine 0.8 Labs (1/14): K 3.7, creatinine 0.8, BNP not elevated, ANA weakly positive, TSH normal Labs (7/14): BNP normal Labs (11/14): K 4.1, creatinine 0.73  PMH: 1. LBBB/IVCD  2. Breast cancer s/p bilateral mastectomy. 3. GERD 4. Type II diabetes mellitus 5. HTN 6. Asthma 7. PE in 2001 8. Hyperlipidemia 9. Nonischemic Cardiomyopathy: Echo 8/09 with 60%.  Admitted in 9/13 with chest pain.  LHC showed no angiographic CAD and EF 35-40%.  Echo at that time was read as showing EF 50-55% but was a technically difficult study.  Echo (12/13): EF 40% with septal hypokinesis, normal RV size and systolic function, no significant valvular abnormalities.  Cardiac MRI (12/13): EF 50%, global hypokinesis, no delayed enhancement.  Echo (7/14) with EF 50-55%, mild LV dilation, mild LAE.   SH: Married, lives in [facility], never smoked.  FH: Uncle with ? SCD.  Brother with ? SCD.    Current Outpatient  Prescriptions  Medication Sig Dispense Refill  . aspirin EC 81 MG tablet Take 81 mg by mouth daily.        Marland Kitchen atorvastatin (LIPITOR) 20 MG tablet Take 40 mg by mouth every evening.       . Blood Glucose Monitoring Suppl (ONE TOUCH ULTRA 2) W/DEVICE KIT Check FBS qam - Dx:250.00 and she will need lancets (1 box) , strips (1 bottle = 100) and controls also thanks  1 each  0  . Calcium Carbonate-Vitamin D (CALCIUM-VITAMIN D) 500-200 MG-UNIT per tablet Take 1 tablet by mouth 2 (two) times daily with a meal.       . carvedilol (COREG) 6.25 MG tablet TAKE 1 TABLET (6.25 MG TOTAL) BY MOUTH 2 (TWO) TIMES DAILY WITH A MEAL.  60 tablet  0  . cetirizine (ZYRTEC) 10 MG tablet Take 10 mg by mouth every evening.      Marland Kitchen CINNAMON PO Take 1 tablet by mouth daily.        . Coenzyme Q10 (CO Q 10 PO) Take 1 capsule by mouth daily.        . cyanocobalamin 500 MCG tablet Take 500 mcg by mouth daily. 10/25/12--Pt not sure of dose      . dexlansoprazole (DEXILANT) 60 MG capsule Take 1 capsule (60 mg total) by mouth daily.  30 capsule  1  . fluticasone (FLONASE) 50 MCG/ACT nasal spray INHALE 2 SPRAYS IN  EACH NOSTRIL DAILY FOR 3 MONTHS  48 g  1  . Glucosamine-Chondroitin (GLUCOSAMINE CHONDR COMPLEX PO) Take 1 tablet by mouth daily.        . Multiple Vitamins-Minerals (MULTIVITAMINS THER. W/MINERALS) TABS Take 1 tablet by mouth daily.        . nitroGLYCERIN (NITROSTAT) 0.4 MG SL tablet Place 1 tablet (0.4 mg total) under the tongue every 5 (five) minutes as needed for chest pain.  25 tablet  prn  . OVER THE COUNTER MEDICATION Take 1 tablet by mouth daily. Leg cramps      . Probiotic Product (RESTORA) CAPS 1 cap po qd  30 capsule  11  . sucralfate (CARAFATE) 1 G tablet Take 1 tablet (1 g total) by mouth 4 (four) times daily.  60 tablet  0  . valsartan (DIOVAN) 160 MG tablet TAKE 1 AND 1/2 TABLETS (TOTAL 240MG ) DAILY  45 tablet  0  . vitamin C (ASCORBIC ACID) 500 MG tablet Take 500 mg by mouth 2 (two) times daily.        .  zafirlukast (ACCOLATE) 20 MG tablet Take 20 mg by mouth 2 (two) times daily.         No current facility-administered medications for this visit.    BP 124/90  Pulse 72  Ht 5' 2.5" (1.588 m)  Wt 81.702 kg (180 lb 1.9 oz)  BMI 32.40 kg/m2 General: NAD Neck: No JVD, no thyromegaly or thyroid nodule.  Lungs: Clear to auscultation bilaterally with normal respiratory effort. CV: Nondisplaced PMI.  Heart regular S1/S2, no S3/S4, no murmur.  No peripheral edema.  No carotid bruit.  Normal pedal pulses.  Abdomen: Soft, nontender, no hepatosplenomegaly, no distention.  Neurologic: Alert and oriented x 3.  Psych: Normal affect. Extremities: No clubbing or cyanosis.   Assessment/Plan: 1. Nonischemic cardiomyopathy: NYHA class I-II symptoms, not volume overloaded on exam.  No CAD on cath 9/13.  EF 40% with septal hypokinesis on echo in 12/13, but cardiac MRI actually showed a calculated EF of 50% with global hypokinesis.  No myocardial delayed enhancement.  Most recent echo in 7/14 showed EF 50-55%. ECG today shows IVCD => ?cardiomyopathy related to IVCD/LBBB.  - Continue Coreg and valsartan at current doses.  2. Hyperlipidemia: Continue statin given risk factors (diabetes, HTN).  Good lipids in 5/14.   Marca Ancona 09/14/2013

## 2013-09-14 NOTE — Patient Instructions (Signed)
Your physician wants you to follow-up in: 1 year with Dr McLean. (December 2015). You will receive a reminder letter in the mail two months in advance. If you don't receive a letter, please call our office to schedule the follow-up appointment.  

## 2013-09-15 ENCOUNTER — Ambulatory Visit
Admission: RE | Admit: 2013-09-15 | Discharge: 2013-09-15 | Disposition: A | Discharge status: Home / Self Care | Payer: Medicare Other | Source: Ambulatory Visit | Attending: Gastroenterology | Admitting: Gastroenterology

## 2013-09-15 DIAGNOSIS — R197 Diarrhea, unspecified: Secondary | ICD-10-CM | POA: Diagnosis not present

## 2013-09-15 DIAGNOSIS — R109 Unspecified abdominal pain: Secondary | ICD-10-CM

## 2013-09-18 ENCOUNTER — Telehealth: Payer: Self-pay | Admitting: Oncology

## 2013-09-18 NOTE — Telephone Encounter (Signed)
pt called and r/s md from january to February 2015

## 2013-09-20 ENCOUNTER — Other Ambulatory Visit: Payer: Self-pay | Admitting: Cardiology

## 2013-09-25 ENCOUNTER — Encounter: Payer: Self-pay | Admitting: Family Medicine

## 2013-09-25 ENCOUNTER — Ambulatory Visit (INDEPENDENT_AMBULATORY_CARE_PROVIDER_SITE_OTHER): Payer: Medicare Other | Admitting: Family Medicine

## 2013-09-25 VITALS — BP 130/64 | HR 76 | Temp 97.6°F | Resp 20 | Ht 62.0 in | Wt 180.0 lb

## 2013-09-25 DIAGNOSIS — R197 Diarrhea, unspecified: Secondary | ICD-10-CM

## 2013-09-25 DIAGNOSIS — E119 Type 2 diabetes mellitus without complications: Secondary | ICD-10-CM

## 2013-09-25 NOTE — Progress Notes (Signed)
Subjective:    Patient ID: [name], female    DOB: [birth year], 67 y.o.   MRN: [id number]  HPI Patient presents today complaining of diarrhea. He has not actually diarrhea. She denies any watery stools. She denies any melena or hematochezia. Rather is just the frequency of defecation. She Percell Locus goes 3-5 times a day. Sometimes urgency her quickly and she is worried about fecal incontinence. She is seeing gastroenterology and the workup so far has been negative. The only study today that has not been performed as a colonoscopy to rule out inflammatory bowel disease. However her weight is completely stable over the last 3-6 months. She denies any fever or arthralgias. Otherwise she feels well. She also has a history of hyperlipidemia as well as diabetes mellitus type 2. She is due for an HgA1c. Past Medical History  Diagnosis Date  . Cancer     breast ca  . Asthma   . GERD (gastroesophageal reflux disease)   . DM2 (diabetes mellitus, type 2)   . Arthritis   . Allergy history unknown   . Hiatal hernia   . Leg cramps   . Hypertension   . Elevated cholesterol   . NICM (nonischemic cardiomyopathy)     a. Echo:  06/14/12: Poor endocardial definition, possible septal HK, EF 50-55%, normal wall motion, mild LAE ;  b.  [facility] 9/13:  normal cors, EF 35-40%,  c. Echo 7/14: EF 50-55%, mild LAE  . LBBB (left bundle branch block) 10/27/2012  . Breast cancer, left breast 10/27/2012    3.2 cm ER positive, Her-2 negative, 1 node positive S/P mastectomy/TTRAM reconstruction 4/08  . Breast cancer, right breast 10/27/2012    3.2 cm ER/PR positive, 1 node positive, Her-2 negative Rx w mastectomy, AC -T chemo, followed by aromatase inhibitor x 5 years dx 4/08  . Diabetes    Current Outpatient Prescriptions on File Prior to Visit  Medication Sig Dispense Refill  . aspirin EC 81 MG tablet Take 81 mg by mouth daily.        Marland Kitchen atorvastatin (LIPITOR) 20 MG tablet Take 40 mg by mouth every evening.       . Blood  Glucose Monitoring Suppl (ONE TOUCH ULTRA 2) W/DEVICE KIT Check FBS qam - Dx:250.00 and she will need lancets (1 box) , strips (1 bottle = 100) and controls also thanks  1 each  0  . Calcium Carbonate-Vitamin D (CALCIUM-VITAMIN D) 500-200 MG-UNIT per tablet Take 1 tablet by mouth 2 (two) times daily with a meal.       . carvedilol (COREG) 6.25 MG tablet TAKE 1 TABLET (6.25 MG TOTAL) BY MOUTH 2 (TWO) TIMES DAILY WITH A MEAL.  60 tablet  0  . cetirizine (ZYRTEC) 10 MG tablet Take 10 mg by mouth every evening.      Marland Kitchen CINNAMON PO Take 1 tablet by mouth daily.        . Coenzyme Q10 (CO Q 10 PO) Take 1 capsule by mouth daily.        . cyanocobalamin 500 MCG tablet Take 500 mcg by mouth daily. 10/25/12--Pt not sure of dose      . dexlansoprazole (DEXILANT) 60 MG capsule Take 1 capsule (60 mg total) by mouth daily.  30 capsule  1  . fluticasone (FLONASE) 50 MCG/ACT nasal spray INHALE 2 SPRAYS IN EACH NOSTRIL DAILY FOR 3 MONTHS  48 g  1  . Glucosamine-Chondroitin (GLUCOSAMINE CHONDR COMPLEX PO) Take 1 tablet by mouth daily.        Marland Kitchen  Multiple Vitamins-Minerals (MULTIVITAMINS THER. W/MINERALS) TABS Take 1 tablet by mouth daily.        . nitroGLYCERIN (NITROSTAT) 0.4 MG SL tablet Place 1 tablet (0.4 mg total) under the tongue every 5 (five) minutes as needed for chest pain.  25 tablet  prn  . OVER THE COUNTER MEDICATION Take 1 tablet by mouth daily. Leg cramps      . Probiotic Product (RESTORA) CAPS 1 cap po qd  30 capsule  11  . sucralfate (CARAFATE) 1 G tablet Take 1 tablet (1 g total) by mouth 4 (four) times daily.  60 tablet  0  . valsartan (DIOVAN) 160 MG tablet TAKE 1 AND 1/2 TABLETS (TOTAL 240MG ) DAILY  45 tablet  11  . vitamin C (ASCORBIC ACID) 500 MG tablet Take 500 mg by mouth 2 (two) times daily.        . zafirlukast (ACCOLATE) 20 MG tablet Take 20 mg by mouth 2 (two) times daily.         No current facility-administered medications on file prior to visit.   Allergies  Allergen Reactions  .  Adhesive [Tape] Other (See Comments)    Skin turns red   . Diphenhydramine Other (See Comments)    Pt feels wired   . Vicodin [Hydrocodone-Acetaminophen] Nausea And Vomiting   History   Social History  . Marital Status: Married    Spouse Name: N/A    Number of Children: 2  . Years of Education: N/A   Occupational History  . retired     post Nurse, mental health   Social History Main Topics  . Smoking status: Never Smoker   . Smokeless tobacco: Never Used  . Alcohol Use: No  . Drug Use: No  . Sexual Activity: No   Other Topics Concern  . Not on file   Social History Narrative  . No narrative on file      Review of Systems  All other systems reviewed and are negative.       Objective:   Physical Exam  Vitals reviewed. Cardiovascular: Normal rate, regular rhythm and normal heart sounds.   Pulmonary/Chest: Effort normal and breath sounds normal. No respiratory distress. She has no wheezes. She has no rales.  Abdominal: Soft. Bowel sounds are normal. She exhibits no distension. There is no tenderness. There is no rebound.          Assessment & Plan:  1. Type II or unspecified type diabetes mellitus without mention of complication, not stated as uncontrolled Check hemoglobin A1c. Goal A1c is less than 6.5 - COMPLETE METABOLIC PANEL WITH GFR - Hemoglobin A1c  2. Diarrhea At this point I believe the patient has irritable bowel syndrome. I gave the patient the option of taking Imodium on an as-needed basis to help control the symptoms. We could also consider starting the patient on Welchol to help address hyperglycemia, lower her LDL, and help decrease the frequency of defecation. The patient like to see the lab work first and consider the Imodium first prior to starting the WelChol.

## 2013-09-26 LAB — COMPLETE METABOLIC PANEL WITH GFR
ALT: 27 U/L (ref 0–35)
Alkaline Phosphatase: 79 U/L (ref 39–117)
GFR, Est Non African American: >89 mL/min
Sodium: 140 mEq/L (ref 135–145)
Total Bilirubin: 0.3 mg/dL (ref 0.3–1.2)
Total Protein: 6.3 g/dL (ref 6.0–8.3)

## 2013-09-26 LAB — HEMOGLOBIN A1C: Mean Plasma Glucose: 143 mg/dL — ABNORMAL HIGH (ref <117)

## 2013-10-03 ENCOUNTER — Other Ambulatory Visit: Payer: Self-pay | Admitting: Family Medicine

## 2013-10-03 MED ORDER — COLESEVELAM HCL 625 MG PO TABS: 1875.0000 mg | ORAL_TABLET | Freq: Two times a day (BID) | ORAL | Status: DC

## 2013-10-07 ENCOUNTER — Other Ambulatory Visit: Payer: Self-pay | Admitting: Cardiology

## 2013-10-10 ENCOUNTER — Other Ambulatory Visit: Payer: Self-pay | Admitting: *Deleted

## 2013-10-10 DIAGNOSIS — K2970 Gastritis, unspecified, without bleeding: Secondary | ICD-10-CM

## 2013-10-10 MED ORDER — DEXLANSOPRAZOLE 60 MG PO CPDR: 60.0000 mg | DELAYED_RELEASE_CAPSULE | Freq: Every day | ORAL | Status: DC

## 2013-10-10 NOTE — Telephone Encounter (Signed)
Med refilled.

## 2013-10-12 ENCOUNTER — Other Ambulatory Visit: Payer: Self-pay

## 2013-10-12 MED ORDER — VALSARTAN 160 MG PO TABS: ORAL_TABLET | ORAL | Status: DC

## 2013-10-20 ENCOUNTER — Other Ambulatory Visit (HOSPITAL_BASED_OUTPATIENT_CLINIC_OR_DEPARTMENT_OTHER): Payer: Medicare Other

## 2013-10-20 DIAGNOSIS — C50919 Malignant neoplasm of unspecified site of unspecified female breast: Secondary | ICD-10-CM

## 2013-10-20 LAB — CBC WITH DIFFERENTIAL/PLATELET
BASO%: 0.6 % (ref 0.0–2.0)
BASOS ABS: 0 10*3/uL (ref 0.0–0.1)
EOS%: 2.5 % (ref 0.0–7.0)
Eosinophils Absolute: 0.1 10*3/uL (ref 0.0–0.5)
HCT: 37.5 % (ref 34.8–46.6)
HEMOGLOBIN: 12.8 g/dL (ref 11.6–15.9)
LYMPH%: 31 % (ref 14.0–49.7)
MCH: 31.8 pg (ref 25.1–34.0)
MCHC: 34.1 g/dL (ref 31.5–36.0)
MCV: 93.3 fL (ref 79.5–101.0)
MONO#: 0.5 10*3/uL (ref 0.1–0.9)
MONO%: 7.9 % (ref 0.0–14.0)
NEUT#: 3.5 10*3/uL (ref 1.5–6.5)
NEUT%: 58 % (ref 38.4–76.8)
Platelets: 242 10*3/uL (ref 145–400)
RBC: 4.02 10*6/uL (ref 3.70–5.45)
RDW: 13 % (ref 11.2–14.5)
WBC: 6 10*3/uL (ref 3.9–10.3)
lymph#: 1.9 10*3/uL (ref 0.9–3.3)

## 2013-10-20 LAB — COMPREHENSIVE METABOLIC PANEL (CC13)
ALK PHOS: 89 U/L (ref 40–150)
ALT: 24 U/L (ref 0–55)
AST: 17 U/L (ref 5–34)
Albumin: 3.8 g/dL (ref 3.5–5.0)
Anion Gap: 9 mEq/L (ref 3–11)
BUN: 14.7 mg/dL (ref 7.0–26.0)
CO2: 25 mEq/L (ref 22–29)
CREATININE: 0.8 mg/dL (ref 0.6–1.1)
Calcium: 9.3 mg/dL (ref 8.4–10.4)
Chloride: 107 mEq/L (ref 98–109)
Glucose: 126 mg/dl (ref 70–140)
POTASSIUM: 4 meq/L (ref 3.5–5.1)
Sodium: 142 mEq/L (ref 136–145)
Total Bilirubin: 0.36 mg/dL (ref 0.20–1.20)
Total Protein: 6.9 g/dL (ref 6.4–8.3)

## 2013-10-24 ENCOUNTER — Ambulatory Visit: Payer: Medicare Other | Admitting: Oncology

## 2013-11-01 ENCOUNTER — Telehealth: Payer: Self-pay | Admitting: *Deleted

## 2013-11-01 NOTE — Telephone Encounter (Signed)
sw pt informed her that G has death in the family. gv appt for 11/09/13@ 10:30am. Pt is aware...td

## 2013-11-05 ENCOUNTER — Other Ambulatory Visit: Payer: Self-pay | Admitting: Cardiology

## 2013-11-06 ENCOUNTER — Ambulatory Visit: Payer: Medicare Other | Admitting: Oncology

## 2013-11-09 ENCOUNTER — Telehealth: Payer: Self-pay | Admitting: Oncology

## 2013-11-09 ENCOUNTER — Ambulatory Visit (HOSPITAL_BASED_OUTPATIENT_CLINIC_OR_DEPARTMENT_OTHER): Payer: Medicare Other | Admitting: Oncology

## 2013-11-09 VITALS — BP 123/83 | HR 86 | Temp 97.8°F | Resp 18 | Ht 62.0 in | Wt 179.6 lb

## 2013-11-09 DIAGNOSIS — C50912 Malignant neoplasm of unspecified site of left female breast: Secondary | ICD-10-CM

## 2013-11-09 DIAGNOSIS — C50911 Malignant neoplasm of unspecified site of right female breast: Secondary | ICD-10-CM

## 2013-11-09 DIAGNOSIS — Z853 Personal history of malignant neoplasm of breast: Secondary | ICD-10-CM | POA: Diagnosis not present

## 2013-11-09 NOTE — Telephone Encounter (Signed)
[facility] PT APPT SCHEDULE FOR FEB 2016 AND MAMMO @ [facility] 05/28/14 @ 10:30AM. FORMER PT OF [facility] TO SEE [facility] IN 37YR PER 11/09/13 POF.

## 2013-11-09 NOTE — Progress Notes (Signed)
Hematology and Oncology Follow Up Visit  [name] [id number] 05/02/1946 68 y.o. 11/09/2013 12:24 PM   Principle Diagnosis: Encounter Diagnoses  Name Primary?  . Breast cancer, right breast Yes  . Breast cancer, left breast      Interim History:  Followup visit for this pleasant 68 year old woman with a history of metachronous primary bilateral breast cancers.  Initial stage II, 1.5 cm,  ER negative,  one node positive,  cancer left breast diagnosed October 1999 treated with mastectomy with immediate TRAM reconstruction followed by 4 cycles of Adriamycin Cytoxan chemotherapy then 5 years of tamoxifen hormonal therapy.  Second primary 3.2 cm, ER PR positive, HER-2 negative, one node positive, cancer of the right breast diagnosed in April 2008 status post right mastectomy 02/04/2007 followed by 4 cycles of carboplatinum plus Taxotere chemotherapy given between May 28th and 05/04/2007. Initial Arimidex hormonal therapy changed to Aromasin due to poor tolerance of Arimidex. She completed 5 years of hormonal therapy in July 2013.  I have been following a small, left, supraclavicular lymph node now for a few years. It has been biopsied twice on 12/08/2007 and again on 08/17/2011 with no malignant diagnosis obtained. She has had followup CT scans of her neck and the area remains unchanged. Most recent CT neck done one month prior to most recent biopsy on 07/16/2011.  She has had no interim medical problems. She still has chronic problems with her GI tract. Probably irritable bowel syndrome. About 5 bowel movements daily. No hematochezia or melena.  She had a normal Pap smear in 2014. Most recent breast imaging of the left TRAM reconstruction done 05/23/2013 also unremarkable. An abdominal ultrasound done 09/15/2013 for atypical left-sided abdominal discomfort was unremarkable.    Medications: reviewed  Allergies:  Allergies  Allergen Reactions  . Adhesive [Tape] Other (See Comments)   Skin turns red   . Diphenhydramine Other (See Comments)    Pt feels wired   . Vicodin [Hydrocodone-Acetaminophen] Nausea And Vomiting    Review of Systems: Hematology:  No bleeding or bruising ENT ROS:  Breast ROS:  Respiratory ROS: No cough or dyspnea Cardiovascular ROS:  No recent chest pain or palpitations Gastrointestinal ROS: Chronic GI problems-idiopathic. Irritable bowel? 5 bowel movements daily. Recent GI evaluation with upper and lower endoscopy in November 2013 reported the patient i as unremarkable Genito-Urinary ROS: No urinary tract symptoms Musculoskeletal ROS: No bone pain Neurological ROS: No headache or change in vision Dermatological ROS: No rash Remaining ROS negative:   Physical Exam: Blood pressure 123/83, pulse 86, temperature 97.8 F (36.6 C), temperature source Oral, resp. rate 18, height 5' 2"  (1.575 m), weight 179 lb 9.6 oz (81.466 kg), SpO2 98.00%. Wt Readings from Last 3 Encounters:  11/09/13 179 lb 9.6 oz (81.466 kg)  09/25/13 180 lb (81.647 kg)  09/14/13 180 lb 1.9 oz (81.702 kg)     General appearance: Well-nourished Caucasian woman HENNT: Pharynx no erythema, exudate, mass, or ulcer. No thyromegaly or thyroid nodules Lymph nodes: Stable, subcentimeter, left supraclavicular lymph node palpable, no cervical or axillary lymphadenopathy Breasts: Right mastectomy. No chest wall lesions. Left TRAM reconstruction. Lungs: Clear to auscultation, resonant to percussion throughout Heart: Regular rhythm, no murmur, no gallop, no rub, no click, no edema Abdomen: Soft, nontender, normal bowel sounds, no mass, no organomegaly Extremities: No edema, no calf tenderness Musculoskeletal: no joint deformities GU:  Vascular: Carotid pulses 2+, no bruits,  Neurologic: Alert, oriented, PERRLA, , cranial nerves grossly normal, motor strength 5 over 5,  reflexes 1+ symmetric, upper body coordination normal, gait normal, Skin: No rash or ecchymosis  Lab Results: CBC  W/Diff    Component Value Date/Time   WBC 6.0 10/20/2013 1026   WBC 6.5 08/08/2013 1125   RBC 4.02 10/20/2013 1026   RBC 4.35 08/08/2013 1125   HGB 12.8 10/20/2013 1026   HGB 14.0 08/08/2013 1125   HCT 37.5 10/20/2013 1026   HCT 41.5 08/08/2013 1125   PLT 242 10/20/2013 1026   PLT 272 08/08/2013 1125   MCV 93.3 10/20/2013 1026   MCV 95.4 08/08/2013 1125   MCH 31.8 10/20/2013 1026   MCH 32.2 08/08/2013 1125   MCHC 34.1 10/20/2013 1026   MCHC 33.7 08/08/2013 1125   RDW 13.0 10/20/2013 1026   RDW 13.2 08/08/2013 1125   LYMPHSABS 1.9 10/20/2013 1026   LYMPHSABS 2.5 08/08/2013 1125   MONOABS 0.5 10/20/2013 1026   MONOABS 0.5 04/20/2013 1125   EOSABS 0.1 10/20/2013 1026   EOSABS 0.2 04/20/2013 1125   BASOSABS 0.0 10/20/2013 1026   BASOSABS 0.0 04/20/2013 1125     Chemistry      Component Value Date/Time   NA 142 10/20/2013 1026   NA 140 09/25/2013 1503   K 4.0 10/20/2013 1026   K 3.8 09/25/2013 1503   CL 105 09/25/2013 1503   CL 107 10/21/2012 1045   CO2 25 10/20/2013 1026   CO2 24 09/25/2013 1503   BUN 14.7 10/20/2013 1026   BUN 14 09/25/2013 1503   CREATININE 0.8 10/20/2013 1026   CREATININE 0.65 09/25/2013 1503   CREATININE 0.7 04/20/2013 1125      Component Value Date/Time   CALCIUM 9.3 10/20/2013 1026   CALCIUM 9.1 09/25/2013 1503   ALKPHOS 89 10/20/2013 1026   ALKPHOS 79 09/25/2013 1503   AST 17 10/20/2013 1026   AST 19 09/25/2013 1503   ALT 24 10/20/2013 1026   ALT 27 09/25/2013 1503   BILITOT 0.36 10/20/2013 1026   BILITOT 0.3 09/25/2013 1503       Radiological Studies: See discussion above   Impression:  #1. Metachronous bilateral primary breast cancers treated as outlined above.  No evidence for new disease now 16 years from the right breast cancer and almost 7 years from diagnosis of the left breast cancer.   #2. Persistent, presumably benign, subcentimeter left supraclavicular lymph node with negative biopsy x2 and stable on current clinical exam.   #3. Isolated episode of  atypical chest pain with normal coronary arteries and normal GI evaluation. 06/2012  #4. Asymptomatic left bundle branch block found at time of 2013 eval.  #5. Type 2 diabetes on oral agent   #6. Degenerative arthritis  #7. Irritable bowel syndrome  #8. Essential hypertension  #9. Nonischemic cardiomyopathy  I will transition her care to Dr. Gunnar Bulla Magrinat the time of her next visit here.    CC: Patient Care Team: Susy Frizzle, MD as PCP - General (Family Medicine)   Annia Belt, MD 2/5/201512:24 PM

## 2013-11-16 DIAGNOSIS — H524 Presbyopia: Secondary | ICD-10-CM | POA: Diagnosis not present

## 2013-11-16 DIAGNOSIS — H35039 Hypertensive retinopathy, unspecified eye: Secondary | ICD-10-CM | POA: Diagnosis not present

## 2013-11-16 DIAGNOSIS — H2510 Age-related nuclear cataract, unspecified eye: Secondary | ICD-10-CM | POA: Diagnosis not present

## 2013-11-16 DIAGNOSIS — H40019 Open angle with borderline findings, low risk, unspecified eye: Secondary | ICD-10-CM | POA: Diagnosis not present

## 2013-11-16 DIAGNOSIS — H02839 Dermatochalasis of unspecified eye, unspecified eyelid: Secondary | ICD-10-CM | POA: Diagnosis not present

## 2013-11-16 DIAGNOSIS — E119 Type 2 diabetes mellitus without complications: Secondary | ICD-10-CM | POA: Diagnosis not present

## 2013-12-02 ENCOUNTER — Encounter: Payer: Self-pay | Admitting: Oncology

## 2013-12-04 ENCOUNTER — Telehealth: Payer: Self-pay | Admitting: Family Medicine

## 2013-12-04 MED ORDER — ZAFIRLUKAST 20 MG PO TABS: 20.0000 mg | ORAL_TABLET | Freq: Two times a day (BID) | ORAL | Status: DC

## 2013-12-04 MED ORDER — COLESEVELAM HCL 625 MG PO TABS: 1875.0000 mg | ORAL_TABLET | Freq: Two times a day (BID) | ORAL | Status: DC

## 2013-12-04 NOTE — Telephone Encounter (Signed)
Med already refilled by another provider

## 2013-12-04 NOTE — Telephone Encounter (Signed)
Pt called stating needs 90 day supply on her medication refilled in order for insurance to cover.

## 2013-12-04 NOTE — Telephone Encounter (Signed)
Call back number is [name] Pt is needing a 3 month refill Welchol and she is needing a refill on carvedilol (COREG) 6.25 MG tablet

## 2013-12-05 ENCOUNTER — Telehealth: Payer: Self-pay | Admitting: Family Medicine

## 2013-12-05 MED ORDER — COLESEVELAM HCL 625 MG PO TABS: 1875.0000 mg | ORAL_TABLET | Freq: Two times a day (BID) | ORAL | Status: DC

## 2013-12-05 NOTE — Telephone Encounter (Signed)
Medication refilled per protocol. 

## 2013-12-11 ENCOUNTER — Telehealth: Payer: Self-pay | Admitting: Family Medicine

## 2013-12-11 DIAGNOSIS — K591 Functional diarrhea: Secondary | ICD-10-CM | POA: Diagnosis not present

## 2013-12-11 DIAGNOSIS — K219 Gastro-esophageal reflux disease without esophagitis: Secondary | ICD-10-CM | POA: Diagnosis not present

## 2013-12-11 MED ORDER — BUDESONIDE-FORMOTEROL FUMARATE 160-4.5 MCG/ACT IN AERO: 2.0000 | INHALATION_SPRAY | Freq: Two times a day (BID) | RESPIRATORY_TRACT | Status: DC

## 2013-12-11 NOTE — Telephone Encounter (Signed)
Call back number is [facility] is want a prescription for Cymbacort (i know prob not spelt right)

## 2013-12-11 NOTE — Telephone Encounter (Signed)
Rx Refilled  

## 2013-12-12 ENCOUNTER — Telehealth: Payer: Self-pay | Admitting: Family Medicine

## 2013-12-12 NOTE — Telephone Encounter (Signed)
Call back number is [phone] Pt would like a 3 month supply of budesonide-formoterol (SYMBICORT) 160-4.5 MCG/ACT inhaler [facility]

## 2013-12-13 NOTE — Telephone Encounter (Signed)
Rx Refilled  

## 2013-12-29 ENCOUNTER — Telehealth: Payer: Self-pay | Admitting: *Deleted

## 2013-12-29 ENCOUNTER — Other Ambulatory Visit: Payer: Self-pay | Admitting: Family Medicine

## 2013-12-29 NOTE — Telephone Encounter (Signed)
Pt called asking for script for her mastectomy bras.  She usually gets these from [facility].  She doesn't see Dr Jana Hakim until feb 2016.  Discussed with Dr Magrinat's nurse, Val & she will fax script to [facility].

## 2014-01-03 ENCOUNTER — Encounter: Payer: Self-pay | Admitting: Family Medicine

## 2014-01-08 ENCOUNTER — Other Ambulatory Visit: Payer: Self-pay | Admitting: Family Medicine

## 2014-01-13 ENCOUNTER — Other Ambulatory Visit: Payer: Self-pay | Admitting: Family Medicine

## 2014-01-15 ENCOUNTER — Telehealth: Payer: Self-pay | Admitting: Family Medicine

## 2014-01-15 MED ORDER — ATORVASTATIN CALCIUM 20 MG PO TABS: 40.0000 mg | ORAL_TABLET | Freq: Every evening | ORAL | Status: DC

## 2014-01-15 NOTE — Telephone Encounter (Signed)
Rx Refilled  

## 2014-01-15 NOTE — Telephone Encounter (Signed)
Needs refill on lipitor [phone]

## 2014-01-16 ENCOUNTER — Other Ambulatory Visit: Payer: Self-pay | Admitting: Family Medicine

## 2014-01-16 DIAGNOSIS — H21239 Degeneration of iris (pigmentary), unspecified eye: Secondary | ICD-10-CM | POA: Diagnosis not present

## 2014-01-16 DIAGNOSIS — H25019 Cortical age-related cataract, unspecified eye: Secondary | ICD-10-CM | POA: Diagnosis not present

## 2014-01-16 DIAGNOSIS — H40019 Open angle with borderline findings, low risk, unspecified eye: Secondary | ICD-10-CM | POA: Diagnosis not present

## 2014-01-16 DIAGNOSIS — H2510 Age-related nuclear cataract, unspecified eye: Secondary | ICD-10-CM | POA: Diagnosis not present

## 2014-01-18 MED ORDER — ATORVASTATIN CALCIUM 20 MG PO TABS: 20.0000 mg | ORAL_TABLET | Freq: Every evening | ORAL | Status: DC

## 2014-01-18 NOTE — Telephone Encounter (Signed)
Med corrected and sent to pharm

## 2014-01-22 ENCOUNTER — Ambulatory Visit: Payer: Medicare Other | Admitting: Family Medicine

## 2014-01-26 ENCOUNTER — Ambulatory Visit (INDEPENDENT_AMBULATORY_CARE_PROVIDER_SITE_OTHER): Payer: Medicare Other | Admitting: Family Medicine

## 2014-01-26 ENCOUNTER — Encounter: Payer: Self-pay | Admitting: Family Medicine

## 2014-01-26 VITALS — BP 120/68 | HR 74 | Temp 97.1°F | Resp 16 | Ht 62.0 in | Wt 178.0 lb

## 2014-01-26 DIAGNOSIS — Z0000 Encounter for general adult medical examination without abnormal findings: Secondary | ICD-10-CM | POA: Diagnosis not present

## 2014-01-26 DIAGNOSIS — Z23 Encounter for immunization: Secondary | ICD-10-CM | POA: Diagnosis not present

## 2014-01-26 DIAGNOSIS — Z79899 Other long term (current) drug therapy: Secondary | ICD-10-CM | POA: Diagnosis not present

## 2014-01-26 DIAGNOSIS — E785 Hyperlipidemia, unspecified: Secondary | ICD-10-CM | POA: Diagnosis not present

## 2014-01-26 LAB — CBC WITH DIFFERENTIAL/PLATELET
Basophils Absolute: 0 10*3/uL (ref 0.0–0.1)
Basophils Relative: 0 % (ref 0–1)
EOS ABS: 0.2 10*3/uL (ref 0.0–0.7)
Eosinophils Relative: 3 % (ref 0–5)
HCT: 36.4 % (ref 36.0–46.0)
HEMOGLOBIN: 12.6 g/dL (ref 12.0–15.0)
Lymphocytes Relative: 40 % (ref 12–46)
Lymphs Abs: 2.2 10*3/uL (ref 0.7–4.0)
MCH: 31.3 pg (ref 26.0–34.0)
MCHC: 34.6 g/dL (ref 30.0–36.0)
MCV: 90.5 fL (ref 78.0–100.0)
MONOS PCT: 7 % (ref 3–12)
Monocytes Absolute: 0.4 10*3/uL (ref 0.1–1.0)
NEUTROS PCT: 50 % (ref 43–77)
Neutro Abs: 2.7 10*3/uL (ref 1.7–7.7)
Platelets: 252 10*3/uL (ref 150–400)
RBC: 4.02 MIL/uL (ref 3.87–5.11)
RDW: 14.2 % (ref 11.5–15.5)
WBC: 5.4 10*3/uL (ref 4.0–10.5)

## 2014-01-26 LAB — COMPLETE METABOLIC PANEL WITH GFR
ALBUMIN: 4 g/dL (ref 3.5–5.2)
ALT: 21 U/L (ref 0–35)
AST: 17 U/L (ref 0–37)
Alkaline Phosphatase: 85 U/L (ref 39–117)
BUN: 22 mg/dL (ref 6–23)
CALCIUM: 8.9 mg/dL (ref 8.4–10.5)
CHLORIDE: 108 meq/L (ref 96–112)
CO2: 24 meq/L (ref 19–32)
Creat: 0.64 mg/dL (ref 0.50–1.10)
GLUCOSE: 102 mg/dL — AB (ref 70–99)
POTASSIUM: 4.1 meq/L (ref 3.5–5.3)
Sodium: 142 mEq/L (ref 135–145)
Total Bilirubin: 0.5 mg/dL (ref 0.2–1.2)
Total Protein: 6.6 g/dL (ref 6.0–8.3)

## 2014-01-26 LAB — LIPID PANEL
CHOLESTEROL: 110 mg/dL (ref 0–200)
HDL: 42 mg/dL (ref >39)
LDL Cholesterol: 49 mg/dL (ref 0–99)
TRIGLYCERIDES: 93 mg/dL (ref <150)
Total CHOL/HDL Ratio: 2.6 Ratio
VLDL: 19 mg/dL (ref 0–40)

## 2014-01-26 LAB — HEMOGLOBIN A1C
Hgb A1c MFr Bld: 6.5 % — ABNORMAL HIGH (ref <5.7)
Mean Plasma Glucose: 140 mg/dL — ABNORMAL HIGH (ref <117)

## 2014-01-26 LAB — TSH: TSH: 2.245 u[IU]/mL (ref 0.350–4.500)

## 2014-01-26 NOTE — Progress Notes (Signed)
Subjective:    Patient ID: [name], female    DOB: 1946-06-19, 68 y.o.   MRN: [id number]  HPI Patient is here today for complete physical exam. She is also due to check her fasting lab work for her hypertension, hyperlipidemia, and type 2 diabetes. Her colonoscopy was performed 3 years ago and is up to date. She had a mammogram in August. Her immunizations are up-to-date except Prevnar 13. Subjective:   Patient presents for Medicare Annual/Subsequent preventive examination.   Review Past Medical/Family/Social:   Risk Factors  Current exercise habits: Daily 30 minutes of aerobic exercise. Dietary issues discussed: Tries to consume a well-balanced diet  Cardiac risk factors: Obesity (BMI >= 30 kg/m2).   Depression Screen  (Note: if answer to either of the following is "Yes", a more complete depression screening is indicated)  Over the past two weeks, have you felt down, depressed or hopeless? No Over the past two weeks, have you felt little interest or pleasure in doing things? No Have you lost interest or pleasure in daily life? No Do you often feel hopeless? No Do you cry easily over simple problems? No   Activities of Daily Living  In your present state of health, do you have any difficulty performing the following activities?:  Driving? No  Managing money? No  Feeding yourself? No  Getting from bed to chair? No  Climbing a flight of stairs? No  Preparing food and eating?: No  Bathing or showering? No  Getting dressed: No  Getting to the toilet? No  Using the toilet:No  Moving around from place to place: No  In the past year have you fallen or had a near fall?:No  Are you sexually active? No  Do you have more than one partner? No   Hearing Difficulties: No  Do you often ask people to speak up or repeat themselves? No  Do you experience ringing or noises in your ears? No Do you have difficulty understanding soft or whispered voices? No  Do you feel that you  have a problem with memory? No Do you often misplace items? No  Do you feel safe at home? Yes  Cognitive Testing  Alert? Yes Normal Appearance?Yes  Oriented to person? Yes Place? Yes  Time? Yes  Recall of three objects? Yes  Can perform simple calculations? Yes  Displays appropriate judgment?Yes  Can read the correct time from a watch face?Yes    Screening Tests / Date Colonoscopy  2014                   Zostavax has had Mammogram 2014 Influenza Vaccine has had Tetanus/tdap UTD  Past Medical History  Diagnosis Date  . Cancer     breast ca  . Asthma   . GERD (gastroesophageal reflux disease)   . DM2 (diabetes mellitus, type 2)   . Arthritis   . Allergy history unknown   . Hiatal hernia   . Leg cramps   . Hypertension   . Elevated cholesterol   . NICM (nonischemic cardiomyopathy)     a. Echo:  06/14/12: Poor endocardial definition, possible septal HK, EF 50-55%, normal wall motion, mild LAE ;  b.  [facility] 9/13:  normal cors, EF 35-40%,  c. Echo 7/14: EF 50-55%, mild LAE  . LBBB (left bundle branch block) 10/27/2012  . Breast cancer, left breast 10/27/2012    3.2 cm ER positive, Her-2 negative, 1 node positive S/P mastectomy/TTRAM reconstruction 4/08  . Breast cancer, right  breast 10/27/2012    3.2 cm ER/PR positive, 1 node positive, Her-2 negative Rx w mastectomy, AC -T chemo, followed by aromatase inhibitor x 5 years dx 4/08  . Diabetes    Past Surgical History  Procedure Laterality Date  . Mastectomy      bilateral  . Knee surgery      left  . Dilation and curettage of uterus     Current Outpatient Prescriptions on File Prior to Visit  Medication Sig Dispense Refill  . aspirin EC 81 MG tablet Take 81 mg by mouth daily.        Marland Kitchen atorvastatin (LIPITOR) 20 MG tablet Take 1 tablet (20 mg total) by mouth every evening.  90 tablet  3  . Blood Glucose Monitoring Suppl (ONE TOUCH ULTRA 2) W/DEVICE KIT Check FBS qam - Dx:250.00 and she will need lancets (1 box) , strips (1  bottle = 100) and controls also thanks  1 each  0  . budesonide-formoterol (SYMBICORT) 160-4.5 MCG/ACT inhaler Inhale 2 puffs into the lungs 2 (two) times daily.  1 Inhaler  3  . Calcium Carbonate-Vitamin D (CALCIUM-VITAMIN D) 500-200 MG-UNIT per tablet Take 1 tablet by mouth 2 (two) times daily with a meal.       . carvedilol (COREG) 6.25 MG tablet TAKE 1 TABLET (6.25 MG TOTAL) BY MOUTH 2 (TWO) TIMES DAILY WITH A MEAL.  60 tablet  5  . cetirizine (ZYRTEC) 10 MG tablet Take 10 mg by mouth every evening.      Marland Kitchen CINNAMON PO Take 1 tablet by mouth daily.        . Coenzyme Q10 (CO Q 10 PO) Take 1 capsule by mouth daily.        . colesevelam (WELCHOL) 625 MG tablet Take 3 tablets (1,875 mg total) by mouth 2 (two) times daily with a meal.  540 tablet  3  . cyanocobalamin 500 MCG tablet Take 500 mcg by mouth daily. 10/25/12--Pt not sure of dose      . dexlansoprazole (DEXILANT) 60 MG capsule Take 1 capsule (60 mg total) by mouth daily.  30 capsule  2  . fluticasone (FLONASE) 50 MCG/ACT nasal spray prn      . Glucosamine-Chondroitin (GLUCOSAMINE CHONDR COMPLEX PO) Take 1 tablet by mouth daily.        . Multiple Vitamins-Minerals (MULTIVITAMINS THER. W/MINERALS) TABS Take 1 tablet by mouth daily.        . nitroGLYCERIN (NITROSTAT) 0.4 MG SL tablet Place 1 tablet (0.4 mg total) under the tongue every 5 (five) minutes as needed for chest pain.  25 tablet  prn  . OVER THE COUNTER MEDICATION Take 1 tablet by mouth daily. Leg cramps      . Probiotic Product (RESTORA) CAPS 1 cap po qd  30 capsule  11  . valsartan (DIOVAN) 160 MG tablet TAKE 1 AND 1/2 TABLETS (TOTAL 240MG) DAILY  135 tablet  3  . vitamin C (ASCORBIC ACID) 500 MG tablet Take 500 mg by mouth 2 (two) times daily.        . zafirlukast (ACCOLATE) 20 MG tablet Take 1 tablet (20 mg total) by mouth 2 (two) times daily.  180 tablet  1   No current facility-administered medications on file prior to visit.   Allergies  Allergen Reactions  . Adhesive  [Tape] Other (See Comments)    Skin turns red   . Diphenhydramine Other (See Comments)    Pt feels wired   . Vicodin [Hydrocodone-Acetaminophen] Nausea  And Vomiting   History   Social History  . Marital Status: Married    Spouse Name: N/A    Number of Children: 2  . Years of Education: N/A   Occupational History  . retired     post Furniture conservator/restorer   Social History Main Topics  . Smoking status: Never Smoker   . Smokeless tobacco: Never Used  . Alcohol Use: No  . Drug Use: No  . Sexual Activity: No   Other Topics Concern  . Not on file   Social History Narrative  . No narrative on file   Family History  Problem Relation Age of Onset  . Cancer Maternal Grandmother     stomach/breast  . Heart attack Paternal Grandfather   . Asthma Maternal Grandmother      Assessment:    Annual wellness medicare exam   Plan:    During the course of the visit the patient was educated and counseled about appropriate screening and preventive services including:  Screening mammography  Colorectal cancer screening  Shingles vaccine. Prescription given to that she can get the vaccine at the pharmacy or Medicare part D.  Screen + for depression. PHQ- 9 score of 12 (moderate depression). We discussed the options of counseling versus possibly a medication. I encouraged her strongly think about the counseling. She is going through some medical problems currently and her husband is as well Mrs. been very stressful for her. She says she will think about it. She does have Xanax to use as needed. Though she may benefit from an SSRI for her more depressive type symptoms but she wants to hold off at this time.  I aksed her to please have her cardioloist send records since we have none on file.  Diet review for nutrition referral? Yes ____ Not Indicated __x__  Patient Instructions (the written plan) was given to the patient.  Medicare Attestation  I have personally reviewed:  The patient's  medical and social history  Their use of alcohol, tobacco or illicit drugs  Their current medications and supplements  The patient's functional ability including ADLs,fall risks, home safety risks, cognitive, and hearing and visual impairment  Diet and physical activities  Evidence for depression or mood disorders  The patient's weight, height, BMI, and visual acuity have been recorded in the chart. I have made referrals, counseling, and provided education to the patient based on review of the above and I have provided the patient with a written personalized care plan for preventive services.        Review of Systems  All other systems reviewed and are negative.      Objective:   Physical Exam  Vitals reviewed. Constitutional: She is oriented to person, place, and time. She appears well-developed and well-nourished. No distress.  HENT:  Head: Normocephalic and atraumatic.  Right Ear: External ear normal.  Left Ear: External ear normal.  Nose: Nose normal.  Mouth/Throat: Oropharynx is clear and moist. No oropharyngeal exudate.  Eyes: Conjunctivae and EOM are normal. Pupils are equal, round, and reactive to light. Right eye exhibits no discharge. Left eye exhibits no discharge. No scleral icterus.  Neck: Normal range of motion. Neck supple. No JVD present. No tracheal deviation present. No thyromegaly present.  Cardiovascular: Normal rate, regular rhythm, normal heart sounds and intact distal pulses.  Exam reveals no gallop and no friction rub.   No murmur heard. Pulmonary/Chest: Effort normal and breath sounds normal. No stridor. No respiratory distress. She has no wheezes.  She has no rales. She exhibits no tenderness.  Abdominal: Soft. Bowel sounds are normal. She exhibits no distension and no mass. There is no tenderness. There is no rebound and no guarding.  Musculoskeletal: Normal range of motion. She exhibits no edema and no tenderness.  Lymphadenopathy:    She has no cervical  adenopathy.  Neurological: She is alert and oriented to person, place, and time. She has normal reflexes. She displays normal reflexes. No cranial nerve deficit. She exhibits normal muscle tone. Coordination normal.  Skin: Skin is warm. No rash noted. She is not diaphoretic. No erythema. No pallor.  Psychiatric: She has a normal mood and affect. Her behavior is normal. Judgment and thought content normal.          Assessment & Plan:  Encounter for long-term (current) use of other medications - Plan: COMPLETE METABOLIC PANEL WITH GFR, Lipid panel, CBC with Differential, TSH, Hemoglobin A1c  Routine general medical examination at a health care facility  Patient's physical exam is up to date. She received Prevnar 13 today in the office. Her cancer screening is up to date. Immunizations are up to date. Regular anticipatory guidance is provided. Check a CBC, CMP, TSH, hemoglobin A1c, and fasting lipid panel.

## 2014-01-26 NOTE — Addendum Note (Signed)
Addended by: Shary Decamp B on: 01/26/2014 09:31 AM   Modules accepted: Orders

## 2014-01-31 DIAGNOSIS — M1710 Unilateral primary osteoarthritis, unspecified knee: Secondary | ICD-10-CM | POA: Diagnosis not present

## 2014-02-14 DIAGNOSIS — M1710 Unilateral primary osteoarthritis, unspecified knee: Secondary | ICD-10-CM | POA: Diagnosis not present

## 2014-02-25 ENCOUNTER — Other Ambulatory Visit: Payer: Self-pay | Admitting: Family Medicine

## 2014-03-26 ENCOUNTER — Telehealth: Payer: Self-pay | Admitting: Cardiology

## 2014-03-26 ENCOUNTER — Encounter: Payer: Self-pay | Admitting: Nurse Practitioner

## 2014-03-26 ENCOUNTER — Ambulatory Visit (INDEPENDENT_AMBULATORY_CARE_PROVIDER_SITE_OTHER): Payer: Medicare Other | Admitting: Nurse Practitioner

## 2014-03-26 VITALS — BP 140/82 | HR 67 | Ht 62.0 in | Wt 177.0 lb

## 2014-03-26 DIAGNOSIS — I428 Other cardiomyopathies: Secondary | ICD-10-CM

## 2014-03-26 DIAGNOSIS — I10 Essential (primary) hypertension: Secondary | ICD-10-CM | POA: Diagnosis not present

## 2014-03-26 DIAGNOSIS — R5381 Other malaise: Secondary | ICD-10-CM | POA: Diagnosis not present

## 2014-03-26 DIAGNOSIS — I447 Left bundle-branch block, unspecified: Secondary | ICD-10-CM

## 2014-03-26 DIAGNOSIS — R5383 Other fatigue: Secondary | ICD-10-CM | POA: Diagnosis not present

## 2014-03-26 DIAGNOSIS — R0600 Dyspnea, unspecified: Secondary | ICD-10-CM

## 2014-03-26 DIAGNOSIS — E785 Hyperlipidemia, unspecified: Secondary | ICD-10-CM

## 2014-03-26 DIAGNOSIS — R0609 Other forms of dyspnea: Secondary | ICD-10-CM

## 2014-03-26 DIAGNOSIS — R0989 Other specified symptoms and signs involving the circulatory and respiratory systems: Secondary | ICD-10-CM

## 2014-03-26 LAB — BASIC METABOLIC PANEL
BUN: 19 mg/dL (ref 6–23)
CO2: 26 mEq/L (ref 19–32)
Calcium: 9.3 mg/dL (ref 8.4–10.5)
Chloride: 108 mEq/L (ref 96–112)
Creatinine, Ser: 0.8 mg/dL (ref 0.4–1.2)
GFR: 81.21 mL/min (ref >60.00)
Glucose, Bld: 104 mg/dL — ABNORMAL HIGH (ref 70–99)
Potassium: 3.7 mEq/L (ref 3.5–5.1)
Sodium: 141 mEq/L (ref 135–145)

## 2014-03-26 LAB — CBC
HCT: 36.4 % (ref 36.0–46.0)
Hemoglobin: 12.2 g/dL (ref 12.0–15.0)
MCHC: 33.6 g/dL (ref 30.0–36.0)
MCV: 94.5 fl (ref 78.0–100.0)
Platelets: 233 10*3/uL (ref 150.0–400.0)
RBC: 3.86 Mil/uL — ABNORMAL LOW (ref 3.87–5.11)
RDW: 13.7 % (ref 11.5–15.5)
WBC: 6.7 10*3/uL (ref 4.0–10.5)

## 2014-03-26 LAB — BRAIN NATRIURETIC PEPTIDE: Pro B Natriuretic peptide (BNP): 50 pg/mL (ref 0.0–100.0)

## 2014-03-26 MED ORDER — CARVEDILOL 12.5 MG PO TABS: 12.5000 mg | ORAL_TABLET | Freq: Two times a day (BID) | ORAL | Status: DC

## 2014-03-26 NOTE — Progress Notes (Signed)
[name] Date of Birth: 14-Jun-1946 Medical Record #[id number]  History of Present Illness: [name] is seen back today for a work in visit. Seen for Dr. Aundra Dubin. She is a 68 year old female with a transient LBBB/IVCD, breast cancer, GERD, type 2 DM, HTN, asthma, remote PE in 2001, HLD and NICM. EF in 2013 was 35 to 40% by cath with no angiographic CAD - MRI calculated EF of 50% with global hypokinesis. Marland Kitchen Echo read as 50 to 55% but TDS. Last echo from July of 2014 showed EF 50 to 55% with mild LV dilation and mild LAE. It has been questioned if her cardiomyopathy is related to her IVCD/LBBB.   Last seen here in December of 2014 and felt to be doing well.  Called earlier today - "she was with her niece over the week-end and felt her heart was not beating right. Her niece, a Energy manager, did an EKG from an app on her phone. Pt did not know what the EKG showed but was told by her niece she had a heart rhythm problem. Pt has a copy of the EKG. Pt asking if she should/could make any adjustments in her medication. I have scheduled pt an appt with Kathrene Alu today at 11:30AM. Pt advised to bring copy of EKG with her to the appointment."  Comes in today. Here alone. She notes that she continues to have spells of "not feeling good". This has been a chronic issue since she was found to have her NICM. She feels "bad" when this happens. Chest is uncomfortable. Feels smothery. No energy. Short of breath. This last spell started Wednesday and lasted until Saturday night. Starting to feel better now. Her "EKG" from the phone shows a LBBB. She has had no dizzy spells. No syncope. Feels unsteady at times. No regular exercise.   Current Outpatient Prescriptions  Medication Sig Dispense Refill  . aspirin EC 81 MG tablet Take 81 mg by mouth daily.        Marland Kitchen atorvastatin (LIPITOR) 20 MG tablet Take 1 tablet (20 mg total) by mouth every evening.  90 tablet  3  . Blood Glucose Monitoring Suppl (ONE  TOUCH ULTRA 2) W/DEVICE KIT Check FBS qam - Dx:250.00 and she will need lancets (1 box) , strips (1 bottle = 100) and controls also thanks  1 each  0  . budesonide-formoterol (SYMBICORT) 160-4.5 MCG/ACT inhaler Inhale 2 puffs into the lungs 2 (two) times daily.  1 Inhaler  3  . Calcium Carbonate-Vitamin D (CALCIUM-VITAMIN D) 500-200 MG-UNIT per tablet Take 1 tablet by mouth 2 (two) times daily with a meal.       . carvedilol (COREG) 6.25 MG tablet TAKE 1 TABLET (6.25 MG TOTAL) BY MOUTH 2 (TWO) TIMES DAILY WITH A MEAL.  60 tablet  5  . cetirizine (ZYRTEC) 10 MG tablet Take 10 mg by mouth every evening.      Marland Kitchen CINNAMON PO Take 1 tablet by mouth daily.        . Coenzyme Q10 (CO Q 10 PO) Take 1 capsule by mouth daily.        . colesevelam (WELCHOL) 625 MG tablet Take 3 tablets (1,875 mg total) by mouth 2 (two) times daily with a meal.  540 tablet  3  . cyanocobalamin 500 MCG tablet Take 500 mcg by mouth daily. 10/25/12--Pt not sure of dose      . dexlansoprazole (DEXILANT) 60 MG capsule Take 1 capsule (60 mg total) by  mouth daily.  30 capsule  2  . fluticasone (FLONASE) 50 MCG/ACT nasal spray prn      . Glucosamine-Chondroitin (GLUCOSAMINE CHONDR COMPLEX PO) Take 1 tablet by mouth daily.        . Multiple Vitamins-Minerals (MULTIVITAMINS THER. W/MINERALS) TABS Take 1 tablet by mouth daily.        . nitroGLYCERIN (NITROSTAT) 0.4 MG SL tablet Place 1 tablet (0.4 mg total) under the tongue every 5 (five) minutes as needed for chest pain.  25 tablet  prn  . OVER THE COUNTER MEDICATION Take 1 tablet by mouth daily. Leg cramps      . Probiotic Product (RESTORA) CAPS 1 cap po qd  30 capsule  11  . valsartan (DIOVAN) 160 MG tablet TAKE 1 AND 1/2 TABLETS (TOTAL 240MG) DAILY  135 tablet  3  . vitamin C (ASCORBIC ACID) 500 MG tablet Take 500 mg by mouth 2 (two) times daily.        . WELCHOL 625 MG tablet CMD FOR 90D TAKE 3 TABLETS (1,875 MG TOTAL) BY MOUTH 2 (TWO) TIMES DAILY WITH A MEAL.  270 tablet  3  .  zafirlukast (ACCOLATE) 20 MG tablet Take 1 tablet (20 mg total) by mouth 2 (two) times daily.  180 tablet  1   No current facility-administered medications for this visit.    Allergies  Allergen Reactions  . Adhesive [Tape] Other (See Comments)    Skin turns red   . Diphenhydramine Other (See Comments)    Pt feels wired   . Vicodin [Hydrocodone-Acetaminophen] Nausea And Vomiting    Past Medical History  Diagnosis Date  . Cancer     breast ca  . Asthma   . GERD (gastroesophageal reflux disease)   . DM2 (diabetes mellitus, type 2)   . Arthritis   . Allergy history unknown   . Hiatal hernia   . Leg cramps   . Hypertension   . Elevated cholesterol   . NICM (nonischemic cardiomyopathy)     a. Echo:  06/14/12: Poor endocardial definition, possible septal HK, EF 50-55%, normal wall motion, mild LAE ;  b.  [facility] 9/13:  normal cors, EF 35-40%,  c. Echo 7/14: EF 50-55%, mild LAE  . LBBB (left bundle branch block) 10/27/2012  . Breast cancer, left breast 10/27/2012    3.2 cm ER positive, Her-2 negative, 1 node positive S/P mastectomy/TTRAM reconstruction 4/08  . Breast cancer, right breast 10/27/2012    3.2 cm ER/PR positive, 1 node positive, Her-2 negative Rx w mastectomy, AC -T chemo, followed by aromatase inhibitor x 5 years dx 4/08  . Diabetes     Past Surgical History  Procedure Laterality Date  . Mastectomy      bilateral  . Knee surgery      left  . Dilation and curettage of uterus      History  Smoking status  . Never Smoker   Smokeless tobacco  . Never Used    History  Alcohol Use No    Family History  Problem Relation Age of Onset  . Cancer Maternal Grandmother     stomach/breast  . Heart attack Paternal Grandfather   . Asthma Maternal Grandmother     Review of Systems: The review of systems is per the HPI.  All other systems were reviewed and are negative.  Physical Exam: BP 140/82  Pulse 67  Ht 5' 2"  (1.575 m)  Wt 177 lb (80.287 kg)  BMI 32.37  kg/m2 Patient is  very pleasant and in no acute distress. Skin is warm and dry. Color is normal.  HEENT is unremarkable. Normocephalic/atraumatic. PERRL. Sclera are nonicteric. Neck is supple. No masses. No JVD. Lungs are clear. Cardiac exam shows a regular rate and rhythm. Abdomen is soft. Extremities are without edema. Gait and ROM are intact. No gross neurologic deficits noted.  Wt Readings from Last 3 Encounters:  03/26/14 177 lb (80.287 kg)  01/26/14 178 lb (80.74 kg)  11/09/13 179 lb 9.6 oz (81.466 kg)     LABORATORY DATA:  EKG today shows sinus rhythm. Unchanged from prior tracings.   Lab Results  Component Value Date   WBC 5.4 01/26/2014   HGB 12.6 01/26/2014   HCT 36.4 01/26/2014   PLT 252 01/26/2014   GLUCOSE 102* 01/26/2014   CHOL 110 01/26/2014   TRIG 93 01/26/2014   HDL 42 01/26/2014   LDLCALC 49 01/26/2014   ALT 21 01/26/2014   AST 17 01/26/2014   NA 142 01/26/2014   K 4.1 01/26/2014   CL 108 01/26/2014   CREATININE 0.64 01/26/2014   BUN 22 01/26/2014   CO2 24 01/26/2014   TSH 2.245 01/26/2014   INR 1.14 06/14/2012   HGBA1C 6.5* 01/26/2014    BNP (last 3 results)  Recent Labs  04/20/13 1125  PROBNP 19.0    Assessment / Plan: 1. Transient LBBB - most likely symptomatic - discussed with Dr. Caryl Comes who was here in the office - feels like this is a rate related LBBB and that when she is having the LBBB she gets symptomatic. Will try to increase her Coreg to 12.5 mg BID and "block her down" more. Recheck echo and baseline labs today. Further disposition to follow. Will get her back to recheck on a day that Dr. Aundra Dubin is here as well.   2. HTN -  Coreg is increased today.   3. NICM with recovery of LV function - recheck echo.   Patient is agreeable to this plan and will call if any problems develop in the interim.   Burtis Junes, RN, [place] [address] [place], [place]  [address] [phone]

## 2014-03-26 NOTE — Telephone Encounter (Signed)
New Message  Pt called states that she can tell when her heart is not 'Doing right". She states that her niece did an EKG on her and that it was not normal.. requests a call back to discuss alternative medications.. Please call back to discuss.

## 2014-03-26 NOTE — Telephone Encounter (Signed)
Pt states she was with her niece over the week-end and felt her heart was not beating right. Her niece, a Energy manager, did an EKG from an app on her phone. Pt did not know what the EKG showed but was told by her niece she had a heart rhythm problem. Pt has a copy of the EKG. Pt asking if she should/could make any adjustments in her medication. I have scheduled pt an appt with Kathrene Alu today at 11:30AM. Pt advised to bring copy of EKG with her to the appointment.

## 2014-03-26 NOTE — Patient Instructions (Addendum)
Increase the Coreg to 12.5 mg two times a day  We will check lab today  We will get an echocardiogram  See me in about a month (on day that Dr. Aundra Dubin is here)  Try to monitor your blood pressure at home (Omron is a good brand)  Call the [place] office at [phone] if you have any questions, problems or concerns.

## 2014-03-27 ENCOUNTER — Other Ambulatory Visit: Payer: Self-pay | Admitting: Family Medicine

## 2014-03-28 DIAGNOSIS — K591 Functional diarrhea: Secondary | ICD-10-CM | POA: Diagnosis not present

## 2014-04-13 ENCOUNTER — Ambulatory Visit (HOSPITAL_COMMUNITY): Payer: Medicare Other | Attending: Internal Medicine | Admitting: Cardiology

## 2014-04-13 DIAGNOSIS — I517 Cardiomegaly: Secondary | ICD-10-CM | POA: Insufficient documentation

## 2014-04-13 DIAGNOSIS — E785 Hyperlipidemia, unspecified: Secondary | ICD-10-CM | POA: Insufficient documentation

## 2014-04-13 DIAGNOSIS — I059 Rheumatic mitral valve disease, unspecified: Secondary | ICD-10-CM | POA: Insufficient documentation

## 2014-04-13 DIAGNOSIS — I079 Rheumatic tricuspid valve disease, unspecified: Secondary | ICD-10-CM | POA: Insufficient documentation

## 2014-04-13 DIAGNOSIS — I2589 Other forms of chronic ischemic heart disease: Secondary | ICD-10-CM | POA: Insufficient documentation

## 2014-04-13 DIAGNOSIS — R0600 Dyspnea, unspecified: Secondary | ICD-10-CM

## 2014-04-13 DIAGNOSIS — E119 Type 2 diabetes mellitus without complications: Secondary | ICD-10-CM | POA: Insufficient documentation

## 2014-04-13 DIAGNOSIS — I447 Left bundle-branch block, unspecified: Secondary | ICD-10-CM | POA: Diagnosis not present

## 2014-04-13 DIAGNOSIS — I428 Other cardiomyopathies: Secondary | ICD-10-CM

## 2014-04-13 DIAGNOSIS — C50919 Malignant neoplasm of unspecified site of unspecified female breast: Secondary | ICD-10-CM | POA: Insufficient documentation

## 2014-04-13 DIAGNOSIS — R0602 Shortness of breath: Secondary | ICD-10-CM

## 2014-04-13 DIAGNOSIS — I10 Essential (primary) hypertension: Secondary | ICD-10-CM | POA: Diagnosis not present

## 2014-04-13 DIAGNOSIS — R5383 Other fatigue: Secondary | ICD-10-CM

## 2014-04-13 NOTE — Progress Notes (Signed)
Echo performed. 

## 2014-05-11 ENCOUNTER — Ambulatory Visit: Payer: Medicare Other | Admitting: Nurse Practitioner

## 2014-05-14 ENCOUNTER — Ambulatory Visit (INDEPENDENT_AMBULATORY_CARE_PROVIDER_SITE_OTHER): Payer: Medicare Other | Admitting: Nurse Practitioner

## 2014-05-14 ENCOUNTER — Encounter: Payer: Self-pay | Admitting: Nurse Practitioner

## 2014-05-14 VITALS — BP 124/72 | HR 75 | Ht 62.0 in | Wt 179.1 lb

## 2014-05-14 DIAGNOSIS — I428 Other cardiomyopathies: Secondary | ICD-10-CM | POA: Diagnosis not present

## 2014-05-14 DIAGNOSIS — I447 Left bundle-branch block, unspecified: Secondary | ICD-10-CM | POA: Diagnosis not present

## 2014-05-14 NOTE — Progress Notes (Signed)
Garen Grams Date of Birth: 1946-09-09 Medical Record #[id number]  History of Present Illness: Ms. Gengler is seen back today for a follow up visit. Seen for Dr. Aundra Dubin. She is a 68 year old female with a transient LBBB/IVCD, breast cancer, GERD, type 2 DM, HTN, asthma, remote PE in 2001, HLD and NICM. EF in 2013 was 35 to 40% by cath with no angiographic CAD - MRI calculated EF of 50% with global hypokinesis. Marland Kitchen Echo read as 50 to 55% but TDS. Last echo from July of 2014 showed EF 50 to 55% with mild LV dilation and mild LAE. It has been questioned if her cardiomyopathy is related to her IVCD/LBBB.   Last seen here in December of 2014 and felt to be doing well.   I saw her back in June after a spell of "her heart not doing right". Sounded like she was having symptomatic rate related LBBB. We increased her Coreg to try and block her down. Updated her echo as well.  Comes back today. Here alone today. She is feeling better. Feels better with the increase in Coreg. Has had less spells of where she feels fatigued/short of breath. She brought in 2 strips from an Iphone - looks like LBBB again. No strips brought in of where she feels ok. Overall, she is happy with how she is now doing.    Current Outpatient Prescriptions  Medication Sig Dispense Refill  . aspirin EC 81 MG tablet Take 81 mg by mouth daily.        Marland Kitchen atorvastatin (LIPITOR) 20 MG tablet Take 1 tablet (20 mg total) by mouth every evening.  90 tablet  3  . Blood Glucose Monitoring Suppl (ONE TOUCH ULTRA 2) W/DEVICE KIT Check FBS qam - Dx:250.00 and she will need lancets (1 box) , strips (1 bottle = 100) and controls also thanks  1 each  0  . budesonide-formoterol (SYMBICORT) 160-4.5 MCG/ACT inhaler Inhale 2 puffs into the lungs 2 (two) times daily.  1 Inhaler  3  . Calcium Carbonate-Vitamin D (CALCIUM-VITAMIN D) 500-200 MG-UNIT per tablet Take 1 tablet by mouth 2 (two) times daily with a meal.       . carvedilol (COREG) 12.5 MG tablet  Take 1 tablet (12.5 mg total) by mouth 2 (two) times daily.  180 tablet  3  . cetirizine (ZYRTEC) 10 MG tablet Take 10 mg by mouth every evening.      Marland Kitchen CINNAMON PO Take 1 tablet by mouth daily.        . Coenzyme Q10 (CO Q 10 PO) Take 1 capsule by mouth daily.        . cyanocobalamin 500 MCG tablet Take 500 mcg by mouth daily. 10/25/12--Pt not sure of dose      . DEXILANT 60 MG capsule TAKE 1 CAPSULE (60 MG TOTAL) BY MOUTH DAILY.  90 capsule  3  . fluticasone (FLONASE) 50 MCG/ACT nasal spray prn      . Glucosamine-Chondroitin (GLUCOSAMINE CHONDR COMPLEX PO) Take 1 tablet by mouth daily.        . Multiple Vitamins-Minerals (MULTIVITAMINS THER. W/MINERALS) TABS Take 1 tablet by mouth daily.        . nitroGLYCERIN (NITROSTAT) 0.4 MG SL tablet Place 1 tablet (0.4 mg total) under the tongue every 5 (five) minutes as needed for chest pain.  25 tablet  prn  . OVER THE COUNTER MEDICATION Take 1 tablet by mouth daily. Leg cramps      . Probiotic  Product (RESTORA) CAPS 1 cap po qd  30 capsule  11  . valsartan (DIOVAN) 160 MG tablet TAKE 1 AND 1/2 TABLETS (TOTAL 240MG) DAILY  135 tablet  3  . vitamin C (ASCORBIC ACID) 500 MG tablet Take 500 mg by mouth 2 (two) times daily.        . WELCHOL 625 MG tablet CMD FOR 90D TAKE 3 TABLETS (1,875 MG TOTAL) BY MOUTH 2 (TWO) TIMES DAILY WITH A MEAL.  270 tablet  3  . zafirlukast (ACCOLATE) 20 MG tablet Take 1 tablet (20 mg total) by mouth 2 (two) times daily.  180 tablet  1   No current facility-administered medications for this visit.    Allergies  Allergen Reactions  . Adhesive [Tape] Other (See Comments)    Skin turns red   . Diphenhydramine Other (See Comments)    Pt feels wired   . Vicodin [Hydrocodone-Acetaminophen] Nausea And Vomiting    Past Medical History  Diagnosis Date  . Cancer     breast ca  . Asthma   . GERD (gastroesophageal reflux disease)   . DM2 (diabetes mellitus, type 2)   . Arthritis   . Allergy history unknown   . Hiatal hernia    . Leg cramps   . Hypertension   . Elevated cholesterol   . NICM (nonischemic cardiomyopathy)     a. Echo:  06/14/12: Poor endocardial definition, possible septal HK, EF 50-55%, normal wall motion, mild LAE ;  b.  [facility] 9/13:  normal cors, EF 35-40%,  c. Echo 7/14: EF 50-55%, mild LAE  . LBBB (left bundle branch block) 10/27/2012  . Breast cancer, left breast 10/27/2012    3.2 cm ER positive, Her-2 negative, 1 node positive S/P mastectomy/TTRAM reconstruction 4/08  . Breast cancer, right breast 10/27/2012    3.2 cm ER/PR positive, 1 node positive, Her-2 negative Rx w mastectomy, AC -T chemo, followed by aromatase inhibitor x 5 years dx 4/08  . Diabetes     Past Surgical History  Procedure Laterality Date  . Mastectomy      bilateral  . Knee surgery      left  . Dilation and curettage of uterus      History  Smoking status  . Never Smoker   Smokeless tobacco  . Never Used    History  Alcohol Use No    Family History  Problem Relation Age of Onset  . Cancer Maternal Grandmother     stomach/breast  . Heart attack Paternal Grandfather   . Asthma Maternal Grandmother     Review of Systems: The review of systems is per the HPI.  All other systems were reviewed and are negative.  Physical Exam: BP 124/72  Pulse 75  Ht 5' 2"  (1.575 m)  Wt 179 lb 1.9 oz (81.248 kg)  BMI 32.75 kg/m2  SpO2 96% Patient is very pleasant and in no acute distress. Skin is warm and dry. Color is normal.  HEENT is unremarkable. Normocephalic/atraumatic. PERRL. Sclera are nonicteric. Neck is supple. No masses. No JVD. Lungs are clear. Cardiac exam shows a regular rate and rhythm. Abdomen is soft. Extremities are without edema. Gait and ROM are intact. No gross neurologic deficits noted.  Wt Readings from Last 3 Encounters:  05/14/14 179 lb 1.9 oz (81.248 kg)  03/26/14 177 lb (80.287 kg)  01/26/14 178 lb (80.74 kg)    LABORATORY DATA/PROCEDURES:  Lab Results  Component Value Date   WBC 6.7  03/26/2014   HGB  12.2 03/26/2014   HCT 36.4 03/26/2014   PLT 233.0 03/26/2014   GLUCOSE 104* 03/26/2014   CHOL 110 01/26/2014   TRIG 93 01/26/2014   HDL 42 01/26/2014   LDLCALC 49 01/26/2014   ALT 21 01/26/2014   AST 17 01/26/2014   NA 141 03/26/2014   K 3.7 03/26/2014   CL 108 03/26/2014   CREATININE 0.8 03/26/2014   BUN 19 03/26/2014   CO2 26 03/26/2014   TSH 2.245 01/26/2014   INR 1.14 06/14/2012   HGBA1C 6.5* 01/26/2014    BNP (last 3 results)  Recent Labs  03/26/14 1203  PROBNP 50.0   Echo Study Conclusions from July 2015  - Left ventricle: The cavity size was normal. Wall thickness was normal. Systolic function was normal. The estimated ejection fraction was in the range of 55% to 60%. Regional wall motion abnormalities cannot be excluded. Doppler parameters are consistent with abnormal left ventricular relaxation (grade 1 diastolic dysfunction). The E/e&' ratio is between 8-15, suggesting indeterminate LV filling pressure. - Mitral valve: Calcified annulus. There was mild regurgitation. - Left atrium: Moderately dilated 42 ml/m2. - Tricuspid valve: There was mild regurgitation. - Pulmonary arteries: PA peak pressure: 23 mm Hg (S).  Impressions:  - Compared to the prior echo in 04/2013, the EF has normalized.    Assessment / Plan: 1. Transient LBBB - most likely symptomatic - felt to be a rate related LBBB and that when she is having the LBBB she gets symptomatic. Coreg already increased. She has improved. See back in 4 months. She is to let us know if she has increased number of spells. EF is normal which is reassuring to know.   2. HTN - BP is good on current regimen.  3. NICM with recovery of LV function per recent echo.  See back in 4 months.   Patient is agreeable to this plan and will call if any problems develop in the interim.   Burtis Junes, RN, [facility] [address] [place], [place]  [address] [phone]

## 2014-05-14 NOTE — Patient Instructions (Addendum)
Stay on your current medicines  See Dr. Aundra Dubin in 4 months  Let us know if you have any more spells  Call the [place] office at [phone] if you have any questions, problems or concerns.

## 2014-05-18 ENCOUNTER — Ambulatory Visit: Payer: Medicare Other | Admitting: Nurse Practitioner

## 2014-05-24 ENCOUNTER — Ambulatory Visit (INDEPENDENT_AMBULATORY_CARE_PROVIDER_SITE_OTHER): Payer: Medicare Other | Admitting: Family Medicine

## 2014-05-24 DIAGNOSIS — Z23 Encounter for immunization: Secondary | ICD-10-CM | POA: Diagnosis not present

## 2014-05-26 ENCOUNTER — Other Ambulatory Visit: Payer: Self-pay | Admitting: Family Medicine

## 2014-05-26 NOTE — Telephone Encounter (Signed)
Refill appropriate and filled per protocol. 

## 2014-05-28 DIAGNOSIS — Z1231 Encounter for screening mammogram for malignant neoplasm of breast: Secondary | ICD-10-CM | POA: Diagnosis not present

## 2014-05-28 DIAGNOSIS — Z853 Personal history of malignant neoplasm of breast: Secondary | ICD-10-CM | POA: Diagnosis not present

## 2014-06-12 DIAGNOSIS — Z23 Encounter for immunization: Secondary | ICD-10-CM | POA: Diagnosis not present

## 2014-06-22 ENCOUNTER — Other Ambulatory Visit: Payer: Self-pay | Admitting: Family Medicine

## 2014-07-10 ENCOUNTER — Encounter: Payer: Self-pay | Admitting: Family Medicine

## 2014-07-10 ENCOUNTER — Ambulatory Visit (INDEPENDENT_AMBULATORY_CARE_PROVIDER_SITE_OTHER): Payer: Medicare Other | Admitting: Family Medicine

## 2014-07-10 VITALS — BP 126/70 | HR 82 | Temp 98.3°F | Resp 18 | Ht 62.0 in | Wt 179.0 lb

## 2014-07-10 DIAGNOSIS — M2660 Temporomandibular joint disorder, unspecified: Secondary | ICD-10-CM

## 2014-07-10 DIAGNOSIS — R590 Localized enlarged lymph nodes: Secondary | ICD-10-CM

## 2014-07-10 DIAGNOSIS — R599 Enlarged lymph nodes, unspecified: Secondary | ICD-10-CM | POA: Diagnosis not present

## 2014-07-10 DIAGNOSIS — E119 Type 2 diabetes mellitus without complications: Secondary | ICD-10-CM | POA: Diagnosis not present

## 2014-07-10 DIAGNOSIS — M26609 Unspecified temporomandibular joint disorder, unspecified side: Secondary | ICD-10-CM

## 2014-07-10 LAB — COMPLETE METABOLIC PANEL WITH GFR
ALBUMIN: 4.2 g/dL (ref 3.5–5.2)
ALT: 46 U/L — AB (ref 0–35)
AST: 21 U/L (ref 0–37)
Alkaline Phosphatase: 80 U/L (ref 39–117)
BUN: 16 mg/dL (ref 6–23)
CO2: 26 meq/L (ref 19–32)
Calcium: 9.2 mg/dL (ref 8.4–10.5)
Chloride: 105 mEq/L (ref 96–112)
Creat: 0.73 mg/dL (ref 0.50–1.10)
GFR, EST NON AFRICAN AMERICAN: 84 mL/min
GFR, Est African American: >89 mL/min
GLUCOSE: 116 mg/dL — AB (ref 70–99)
POTASSIUM: 4.2 meq/L (ref 3.5–5.3)
SODIUM: 139 meq/L (ref 135–145)
TOTAL PROTEIN: 6.8 g/dL (ref 6.0–8.3)
Total Bilirubin: 0.4 mg/dL (ref 0.2–1.2)

## 2014-07-10 LAB — LIPID PANEL
Cholesterol: 115 mg/dL (ref 0–200)
HDL: 43 mg/dL (ref >39)
LDL CALC: 45 mg/dL (ref 0–99)
Total CHOL/HDL Ratio: 2.7 Ratio
Triglycerides: 137 mg/dL (ref <150)
VLDL: 27 mg/dL (ref 0–40)

## 2014-07-10 LAB — HEMOGLOBIN A1C
HEMOGLOBIN A1C: 6.7 % — AB (ref <5.7)
MEAN PLASMA GLUCOSE: 146 mg/dL — AB (ref <117)

## 2014-07-10 MED ORDER — DIAZEPAM 10 MG PO TABS: 10.0000 mg | ORAL_TABLET | Freq: Every evening | ORAL | Status: DC | PRN

## 2014-07-10 NOTE — Progress Notes (Signed)
Subjective:    Patient ID: [name], female    DOB: 31-Jul-1946, 68 y.o.   MRN: [id number]  HPI Patient has a history of breast cancer. Since 2009, she is complaining of a palpable lump above her left clavicle. She showed this to oncologist on numerous occasions. Previous fine needle aspirations have been inconclusive. The patient had a CT scan of the soft tissues of the neck in 2009 and again in 2012. The area of concern correspond to supraclavicular lymph nodes each less than 10 mm in size and benign appearing.  They were stable in size from the time period from 2009 2012. The patient has had continual mild swelling in this area ever since. Today she is concerned because she is also having pain in her left jaw the TMJ joint and she believes it to maybe related. She reports pain with range of motion the TMJ joint and chewing.  She has palpable crepitus in the TMJ joint. She reports pain and tenderness in the masseter muscle. Examination of the tympanic membranes, posterior oropharynx, and cervical lymph nodes are completely normal. Past Medical History  Diagnosis Date  . Cancer     breast ca  . Asthma   . GERD (gastroesophageal reflux disease)   . DM2 (diabetes mellitus, type 2)   . Arthritis   . Allergy history unknown   . Hiatal hernia   . Leg cramps   . Hypertension   . Elevated cholesterol   . NICM (nonischemic cardiomyopathy)     a. Echo:  06/14/12: Poor endocardial definition, possible septal HK, EF 50-55%, normal wall motion, mild LAE ;  b.  [facility] 9/13:  normal cors, EF 35-40%,  c. Echo 7/14: EF 50-55%, mild LAE  . LBBB (left bundle branch block) 10/27/2012  . Breast cancer, left breast 10/27/2012    3.2 cm ER positive, Her-2 negative, 1 node positive S/P mastectomy/TTRAM reconstruction 4/08  . Breast cancer, right breast 10/27/2012    3.2 cm ER/PR positive, 1 node positive, Her-2 negative Rx w mastectomy, AC -T chemo, followed by aromatase inhibitor x 5 years dx 4/08  . Diabetes      Current Outpatient Prescriptions on File Prior to Visit  Medication Sig Dispense Refill  . aspirin EC 81 MG tablet Take 81 mg by mouth daily.        Marland Kitchen atorvastatin (LIPITOR) 20 MG tablet Take 1 tablet (20 mg total) by mouth every evening.  90 tablet  3  . Blood Glucose Monitoring Suppl (ONE TOUCH ULTRA 2) W/DEVICE KIT Check FBS qam - Dx:250.00 and she will need lancets (1 box) , strips (1 bottle = 100) and controls also thanks  1 each  0  . budesonide-formoterol (SYMBICORT) 160-4.5 MCG/ACT inhaler Inhale 2 puffs into the lungs 2 (two) times daily.  1 Inhaler  3  . Calcium Carbonate-Vitamin D (CALCIUM-VITAMIN D) 500-200 MG-UNIT per tablet Take 1 tablet by mouth 2 (two) times daily with a meal.       . carvedilol (COREG) 12.5 MG tablet Take 1 tablet (12.5 mg total) by mouth 2 (two) times daily.  180 tablet  3  . cetirizine (ZYRTEC) 10 MG tablet Take 10 mg by mouth every evening.      Marland Kitchen CINNAMON PO Take 1 tablet by mouth daily.        . Coenzyme Q10 (CO Q 10 PO) Take 1 capsule by mouth daily.        . cyanocobalamin 500 MCG tablet Take 500  mcg by mouth daily. 10/25/12--Pt not sure of dose      . DEXILANT 60 MG capsule TAKE 1 CAPSULE (60 MG TOTAL) BY MOUTH DAILY.  90 capsule  3  . fluticasone (FLONASE) 50 MCG/ACT nasal spray prn      . Glucosamine-Chondroitin (GLUCOSAMINE CHONDR COMPLEX PO) Take 1 tablet by mouth daily.        . Multiple Vitamins-Minerals (MULTIVITAMINS THER. W/MINERALS) TABS Take 1 tablet by mouth daily.        . nitroGLYCERIN (NITROSTAT) 0.4 MG SL tablet Place 1 tablet (0.4 mg total) under the tongue every 5 (five) minutes as needed for chest pain.  25 tablet  prn  . OVER THE COUNTER MEDICATION Take 1 tablet by mouth daily. Leg cramps      . Probiotic Product (RESTORA) CAPS 1 cap po qd  30 capsule  11  . valsartan (DIOVAN) 160 MG tablet TAKE 1 AND 1/2 TABLETS (TOTAL 240MG) DAILY  135 tablet  3  . vitamin C (ASCORBIC ACID) 500 MG tablet Take 500 mg by mouth 2 (two) times daily.         . WELCHOL 625 MG tablet CMD FOR 90D TAKE 3 TABLETS (1,875 MG TOTAL) BY MOUTH 2 (TWO) TIMES DAILY WITH A MEAL.  270 tablet  3  . zafirlukast (ACCOLATE) 20 MG tablet TAKE 1 TABLET (20 MG TOTAL) BY MOUTH 2 (TWO) TIMES DAILY.  180 tablet  1   No current facility-administered medications on file prior to visit.   Allergies  Allergen Reactions  . Adhesive [Tape] Other (See Comments)    Skin turns red   . Diphenhydramine Other (See Comments)    Pt feels wired   . Vicodin [Hydrocodone-Acetaminophen] Nausea And Vomiting   History   Social History  . Marital Status: Married    Spouse Name: N/A    Number of Children: 2  . Years of Education: N/A   Occupational History  . retired     post Furniture conservator/restorer   Social History Main Topics  . Smoking status: Never Smoker   . Smokeless tobacco: Never Used  . Alcohol Use: No  . Drug Use: No  . Sexual Activity: No   Other Topics Concern  . Not on file   Social History Narrative  . No narrative on file      Review of Systems  All other systems reviewed and are negative.      Objective:   Physical Exam  Vitals reviewed. HENT:  Right Ear: Tympanic membrane, external ear and ear canal normal.  Left Ear: Tympanic membrane, external ear and ear canal normal.  Nose: Nose normal.  Mouth/Throat: Oropharynx is clear and moist and mucous membranes are normal. No oropharyngeal exudate.  Eyes: Conjunctivae are normal. Pupils are equal, round, and reactive to light.  Neck: Neck supple. No thyromegaly present.  Cardiovascular: Normal rate, regular rhythm and normal heart sounds.   Pulmonary/Chest: Effort normal and breath sounds normal. No respiratory distress. She has no wheezes. She has no rales.  Lymphadenopathy:       Head (right side): No submental, no submandibular, no tonsillar, no preauricular and no posterior auricular adenopathy present.       Head (left side): No submental, no submandibular, no tonsillar, no  preauricular and no posterior auricular adenopathy present.    She has no cervical adenopathy.       Right: No supraclavicular adenopathy present.       Left: No supraclavicular adenopathy present.  I am unable to appreciate any left supraclavicular lymph nodes. There is some mild swelling above her left clavicle and which is chronic.        Assessment & Plan:  TMJ (temporomandibular joint syndrome) - Plan: diazepam (VALIUM) 10 MG tablet  Diabetes mellitus type II, controlled - Plan: COMPLETE METABOLIC PANEL WITH GFR, Lipid panel, Hemoglobin A1c  Supraclavicular lymphadenopathy  I believe the patient is having TMJ pain. I recommended Valium 10 mg by mouth each bedtime. Recommended Aleve 500 mg by mouth twice a day, and I recommend she wear mouth guard at night. I anticipate over the next 10-14 days the pain in her left TMJ joint will improve. I stressed to the patient that I do not believe this is related to the left supraclavicular lymph nodes that she's been concerned about for quite some time. Offered the patient a repeat CT scan of the soft tissues of the neck to evaluate for any change in size of lymph nodes because I'm unable to appreciate them today on exam. The patient declines this at the present time. The patient is overdue for a fasting lipid panel and she wants to get these while she is here

## 2014-07-20 DIAGNOSIS — N904 Leukoplakia of vulva: Secondary | ICD-10-CM | POA: Diagnosis not present

## 2014-07-20 DIAGNOSIS — Z124 Encounter for screening for malignant neoplasm of cervix: Secondary | ICD-10-CM | POA: Diagnosis not present

## 2014-07-20 DIAGNOSIS — M1711 Unilateral primary osteoarthritis, right knee: Secondary | ICD-10-CM | POA: Diagnosis not present

## 2014-07-20 DIAGNOSIS — Z01419 Encounter for gynecological examination (general) (routine) without abnormal findings: Secondary | ICD-10-CM | POA: Diagnosis not present

## 2014-07-20 DIAGNOSIS — C50919 Malignant neoplasm of unspecified site of unspecified female breast: Secondary | ICD-10-CM | POA: Diagnosis not present

## 2014-08-03 DIAGNOSIS — M170 Bilateral primary osteoarthritis of knee: Secondary | ICD-10-CM | POA: Diagnosis not present

## 2014-08-03 DIAGNOSIS — M25562 Pain in left knee: Secondary | ICD-10-CM | POA: Diagnosis not present

## 2014-08-10 ENCOUNTER — Ambulatory Visit (INDEPENDENT_AMBULATORY_CARE_PROVIDER_SITE_OTHER): Payer: Medicare Other | Admitting: Family Medicine

## 2014-08-10 ENCOUNTER — Encounter: Payer: Self-pay | Admitting: Family Medicine

## 2014-08-10 VITALS — BP 128/76 | HR 64 | Temp 98.6°F | Resp 16 | Ht 62.0 in | Wt 178.0 lb

## 2014-08-10 DIAGNOSIS — J209 Acute bronchitis, unspecified: Secondary | ICD-10-CM

## 2014-08-10 MED ORDER — DOXYCYCLINE HYCLATE 100 MG PO TABS: 100.0000 mg | ORAL_TABLET | Freq: Two times a day (BID) | ORAL | Status: DC

## 2014-08-10 MED ORDER — PREDNISONE 10 MG PO TABS: ORAL_TABLET | ORAL | Status: DC

## 2014-08-10 NOTE — Progress Notes (Signed)
Patient ID: [name], female   DOB: 1946-08-30, 68 y.o.   MRN: [id number]   Subjective:    Patient ID: [name], female    DOB: Jan 16, 1946, 68 y.o.   MRN: [id number]  Patient presents for Illness patient here with cough with production worsening over the past 10 days. She does have history of asthma. She is not noticing any significant wheezing or shortness of breath. She has not had any fever no chest pain. She's been using cough drops and honey for cough suppressant. She is leaving tomorrow to travel to [place] the next 2 weeks   Review Of Systems:  GEN- denies fatigue, fever, weight loss,weakness, recent illness HEENT- denies eye drainage, change in vision, nasal discharge, CVS- denies chest pain, palpitations RESP- denies SOB,+ cough, wheeze ABD- denies N/V, change in stools, abd pain GU- denies dysuria, hematuria, dribbling, incontinence MSK- denies joint pain, muscle aches, injury Neuro- denies headache, dizziness, syncope, seizure activity       Objective:    BP 128/76 mmHg  Pulse 64  Temp(Src) 98.6 F (37 C) (Oral)  Resp 16  Ht 5\' 2"  (1.575 m)  Wt 178 lb (80.74 kg)  BMI 32.55 kg/m2  SpO2 97% GEN- NAD, alert and oriented x3 HEENT- PERRL, EOMI, non injected sclera, pink conjunctiva, MMM, oropharynx mild injection, TM clear bilat no effusion,  No  maxillary sinus tenderness,nares clear Neck- Supple, no LAD CVS- RRR, no murmur RESP- course BS, occasional wheeze EXT- No edema Pulses- Radial 2+          Assessment & Plan:      Problem List Items Addressed This Visit    None    Visit Diagnoses    Acute bronchitis, unspecified organism    -  Primary    Cover with doxy, mucinex DM, given prednisone if she worsens, note not using symbicort since the spring, advised to use this regulary       Note: This dictation was prepared with Dragon dictation along with smaller phrase technology. Any transcriptional errors that result from this process are  unintentional.

## 2014-08-10 NOTE — Patient Instructions (Signed)
Mucinex DM 1 tablet twice cough Doxycycline 1 tablet twice a day cough Take prednisone as prescribed if you get worse F/U as needed

## 2014-09-10 ENCOUNTER — Ambulatory Visit (INDEPENDENT_AMBULATORY_CARE_PROVIDER_SITE_OTHER): Payer: Medicare Other | Admitting: Cardiology

## 2014-09-10 ENCOUNTER — Encounter: Payer: Self-pay | Admitting: Cardiology

## 2014-09-10 VITALS — BP 132/84 | HR 78 | Ht 62.0 in | Wt 177.0 lb

## 2014-09-10 DIAGNOSIS — I429 Cardiomyopathy, unspecified: Secondary | ICD-10-CM | POA: Diagnosis not present

## 2014-09-10 DIAGNOSIS — E785 Hyperlipidemia, unspecified: Secondary | ICD-10-CM | POA: Diagnosis not present

## 2014-09-10 DIAGNOSIS — I428 Other cardiomyopathies: Secondary | ICD-10-CM

## 2014-09-10 NOTE — Patient Instructions (Signed)
Your physician wants you to follow-up in: 1 year with Dr McLean. (December 2016). You will receive a reminder letter in the mail two months in advance. If you don't receive a letter, please call our office to schedule the follow-up appointment.  

## 2014-09-10 NOTE — Progress Notes (Signed)
Patient ID: [name], female   DOB: 1946-03-25, 68 y.o.   MRN: [id number]  PCP: Dr. Dennard Schaumann  68 yo with history of HTN and type II diabetes presents for evaluation of nonischemic cardiomyopathy.  Back in 2009, she had an echo with normal EF.  She was admitted in 9/13 with chest pain and LBBB.  She had LHC showing no CAD but EF 35-40%.  Echo was a technically difficult study with EF reported as 50-55%.  She has been taking ARB and Coreg.  Repeat echo in 12/13 showed EF 40% with septal hypokinesis.  Cardiac MRI was done in 12/13 as well, with calculated EF 50%, global hypokinesis, and no delayed enhancement.  Most recent echo in 7/15 showed EF 55-60% with mild MR and mild TR.  She can walk on flat ground without exertional dyspnea.  She has very mild shortness of breath with steps.  No chest pain.  No tachypalpitations.  No orthopnea/PND.  No lightheadedness/syncope.   Labs (9/13): TSH 4.884 (elevated), proBNP 155 Labs (10/13): K 3.6, creatinine 0.8 Labs (1/14): K 3.7, creatinine 0.8, BNP not elevated, ANA weakly positive, TSH normal Labs (7/14): BNP normal Labs (11/14): K 4.1, creatinine 0.73 Labs (10/15): K 4.2, creatinine 0.73, LDL 45  PMH: 1. LBBB/IVCD  2. Breast cancer s/p bilateral mastectomy. 3. GERD 4. Type II diabetes mellitus 5. HTN 6. Asthma 7. PE in 2001 8. Hyperlipidemia 9. Nonischemic Cardiomyopathy: Echo 8/09 with 60%.  Admitted in 9/13 with chest pain.  LHC showed no angiographic CAD and EF 35-40%.  Echo at that time was read as showing EF 50-55% but was a technically difficult study.  Echo (12/13): EF 40% with septal hypokinesis, normal RV size and systolic function, no significant valvular abnormalities.  Cardiac MRI (12/13): EF 50%, global hypokinesis, no delayed enhancement.  Echo (7/14) with EF 50-55%, mild LV dilation, mild LAE.  Echo (7/15) with EF 55-60%, mild MR and mild TR.  SH: Married, lives in [facility], never smoked.  FH: Uncle with ? SCD.   Brother with ? SCD.    Current Outpatient Prescriptions  Medication Sig Dispense Refill  . aspirin EC 81 MG tablet Take 81 mg by mouth daily.      Marland Kitchen atorvastatin (LIPITOR) 20 MG tablet Take 1 tablet (20 mg total) by mouth every evening. 90 tablet 3  . Blood Glucose Monitoring Suppl (ONE TOUCH ULTRA 2) W/DEVICE KIT Check FBS qam - Dx:250.00 and she will need lancets (1 box) , strips (1 bottle = 100) and controls also thanks 1 each 0  . budesonide-formoterol (SYMBICORT) 160-4.5 MCG/ACT inhaler Inhale 2 puffs into the lungs 2 (two) times daily. 1 Inhaler 3  . Calcium Carbonate-Vitamin D (CALCIUM-VITAMIN D) 500-200 MG-UNIT per tablet Take 1 tablet by mouth 2 (two) times daily with a meal.     . carvedilol (COREG) 12.5 MG tablet Take 1 tablet (12.5 mg total) by mouth 2 (two) times daily. 180 tablet 3  . cetirizine (ZYRTEC) 10 MG tablet Take 10 mg by mouth every evening.    Marland Kitchen CINNAMON PO Take 1 tablet by mouth daily.      . Coenzyme Q10 (CO Q 10 PO) Take 1 capsule by mouth daily.      . cyanocobalamin 500 MCG tablet Take 500 mcg by mouth daily. 10/25/12--Pt not sure of dose    . DEXILANT 60 MG capsule TAKE 1 CAPSULE (60 MG TOTAL) BY MOUTH DAILY. 90 capsule 3  . fluticasone (FLONASE) 50 MCG/ACT nasal  spray prn    . Glucosamine-Chondroitin (GLUCOSAMINE CHONDR COMPLEX PO) Take 1 tablet by mouth daily.      . Multiple Vitamins-Minerals (MULTIVITAMINS THER. W/MINERALS) TABS Take 1 tablet by mouth daily.      . nitroGLYCERIN (NITROSTAT) 0.4 MG SL tablet Place 1 tablet (0.4 mg total) under the tongue every 5 (five) minutes as needed for chest pain. 25 tablet prn  . OVER THE COUNTER MEDICATION Take 1 tablet by mouth daily. Leg cramps    . Probiotic Product (RESTORA) CAPS 1 cap po qd 30 capsule 11  . valsartan (DIOVAN) 160 MG tablet TAKE 1 AND 1/2 TABLETS (TOTAL 240MG) DAILY 135 tablet 3  . vitamin C (ASCORBIC ACID) 500 MG tablet Take 500 mg by mouth 2 (two) times daily.      . WELCHOL 625 MG tablet CMD FOR  90D TAKE 3 TABLETS (1,875 MG TOTAL) BY MOUTH 2 (TWO) TIMES DAILY WITH A MEAL. 270 tablet 3  . zafirlukast (ACCOLATE) 20 MG tablet TAKE 1 TABLET (20 MG TOTAL) BY MOUTH 2 (TWO) TIMES DAILY. 180 tablet 1   No current facility-administered medications for this visit.    BP 132/84 mmHg  Pulse 78  Ht 5' 2"  (1.575 m)  Wt 177 lb (80.287 kg)  BMI 32.37 kg/m2 General: NAD Neck: No JVD, no thyromegaly or thyroid nodule.  Lungs: Clear to auscultation bilaterally with normal respiratory effort. CV: Nondisplaced PMI.  Heart regular S1/S2, no S3/S4, no murmur.  No peripheral edema.  No carotid bruit.  Normal pedal pulses.  Abdomen: Soft, nontender, no hepatosplenomegaly, no distention.  Neurologic: Alert and oriented x 3.  Psych: Normal affect. Extremities: No clubbing or cyanosis.   Assessment/Plan: 1. Nonischemic cardiomyopathy: NYHA class I-II symptoms, not volume overloaded on exam.  No CAD on cath 9/13.  EF 40% with septal hypokinesis on echo in 12/13, but cardiac MRI actually showed a calculated EF of 50% with global hypokinesis.  No myocardial delayed enhancement.  Most recent echo in 7/15 showed EF 55-60%. ECGs have shown  IVCD/LBBB => ?cardiomyopathy related to IVCD/LBBB.  - Continue Coreg and valsartan at current doses.  2. Hyperlipidemia: Continue statin given risk factors (diabetes, HTN).  Good lipids in 10/15.   Followup 1 year.  Loralie Champagne 09/10/2014

## 2014-09-12 ENCOUNTER — Other Ambulatory Visit: Payer: Self-pay | Admitting: Cardiology

## 2014-09-13 ENCOUNTER — Encounter (HOSPITAL_COMMUNITY): Payer: Self-pay | Admitting: Cardiovascular Disease

## 2014-10-15 ENCOUNTER — Telehealth: Payer: Self-pay | Admitting: Family Medicine

## 2014-10-15 NOTE — Telephone Encounter (Signed)
Patient would like to speak with you regarding her hep b shots  Please call her back at [phone]

## 2014-10-16 NOTE — Telephone Encounter (Signed)
Spoke to pt's husband and questions answered - Pt will need to start [facility] series

## 2014-10-21 ENCOUNTER — Other Ambulatory Visit: Payer: Self-pay | Admitting: Family Medicine

## 2014-11-01 ENCOUNTER — Ambulatory Visit (INDEPENDENT_AMBULATORY_CARE_PROVIDER_SITE_OTHER): Payer: Medicare Other | Admitting: *Deleted

## 2014-11-01 DIAGNOSIS — Z23 Encounter for immunization: Secondary | ICD-10-CM

## 2014-11-05 ENCOUNTER — Telehealth: Payer: Self-pay | Admitting: Hematology and Oncology

## 2014-11-05 NOTE — Telephone Encounter (Signed)
returned pt call and s.w. pt and r/s appt due to pt out of town...pt awareof new d.t

## 2014-11-08 ENCOUNTER — Ambulatory Visit: Payer: Medicare Other | Admitting: Hematology and Oncology

## 2014-11-16 ENCOUNTER — Other Ambulatory Visit: Payer: Self-pay | Admitting: Family Medicine

## 2014-11-19 DIAGNOSIS — Z1211 Encounter for screening for malignant neoplasm of colon: Secondary | ICD-10-CM | POA: Diagnosis not present

## 2014-11-22 ENCOUNTER — Other Ambulatory Visit: Payer: Self-pay | Admitting: Family Medicine

## 2014-11-22 NOTE — Telephone Encounter (Signed)
[facility] [address]  Patient is asking for refill on her Symbicort if possible  [phone] if any questions

## 2014-11-23 MED ORDER — BUDESONIDE-FORMOTEROL FUMARATE 160-4.5 MCG/ACT IN AERO: 2.0000 | INHALATION_SPRAY | Freq: Two times a day (BID) | RESPIRATORY_TRACT | Status: DC

## 2014-11-23 NOTE — Telephone Encounter (Signed)
Prescription symbicort refilled

## 2014-12-27 ENCOUNTER — Encounter: Payer: Self-pay | Admitting: Family Medicine

## 2014-12-27 ENCOUNTER — Ambulatory Visit (INDEPENDENT_AMBULATORY_CARE_PROVIDER_SITE_OTHER): Payer: Medicare Other | Admitting: Family Medicine

## 2014-12-27 VITALS — BP 132/70 | HR 78 | Temp 97.6°F | Resp 14 | Ht 62.0 in | Wt 182.0 lb

## 2014-12-27 DIAGNOSIS — M25562 Pain in left knee: Secondary | ICD-10-CM

## 2014-12-27 DIAGNOSIS — M76891 Other specified enthesopathies of right lower limb, excluding foot: Secondary | ICD-10-CM

## 2014-12-27 DIAGNOSIS — M65851 Other synovitis and tenosynovitis, right thigh: Secondary | ICD-10-CM | POA: Diagnosis not present

## 2014-12-27 MED ORDER — DICLOFENAC SODIUM 75 MG PO TBEC: 75.0000 mg | DELAYED_RELEASE_TABLET | Freq: Two times a day (BID) | ORAL | Status: DC

## 2014-12-27 NOTE — Progress Notes (Signed)
 Subjective:    Patient ID: [name], female    DOB: 06/27/1946, 68 y.o.   MRN: [id number]  HPI Patient has a history of left knee pain due to arthritis. Recently she was on vacation where she was doing a lot of walking on uneven streets and cobblestone pathways. Ever since that period of time she has had severe pain in her left knee. It hurts to walk. She also injured her right hip. She has severe pain in the anterior portion of her hip with hip flexion even against gravity. However on examination today she has normal internal and external range of motion in the hip with no pain. Passively I can flex the hip to her chest without pain. She has no tenderness to palpation around the hip joint. However resisted hip dorsiflexion causes severe pain in the anterior portion of the hip. She denies any falls or injuries Past Medical History  Diagnosis Date  . Cancer     breast ca  . Asthma   . GERD (gastroesophageal reflux disease)   . DM2 (diabetes mellitus, type 2)   . Arthritis   . Allergy history unknown   . Hiatal hernia   . Leg cramps   . Hypertension   . Elevated cholesterol   . NICM (nonischemic cardiomyopathy)     a. Echo:  06/14/12: Poor endocardial definition, possible septal HK, EF 50-55%, normal wall motion, mild LAE ;  b.  [facility] 9/13:  normal cors, EF 35-40%,  c. Echo 7/14: EF 50-55%, mild LAE  . LBBB (left bundle branch block) 10/27/2012  . Breast cancer, left breast 10/27/2012    3.2 cm ER positive, Her-2 negative, 1 node positive S/P mastectomy/TTRAM reconstruction 4/08  . Breast cancer, right breast 10/27/2012    3.2 cm ER/PR positive, 1 node positive, Her-2 negative Rx w mastectomy, AC -T chemo, followed by aromatase inhibitor x 5 years dx 4/08  . Diabetes    Past Surgical History  Procedure Laterality Date  . Mastectomy      bilateral  . Knee surgery      left  . Dilation and curettage of uterus    . Left heart catheterization with coronary angiogram N/A 06/15/2012   Procedure: LEFT HEART CATHETERIZATION WITH CORONARY ANGIOGRAM;  Surgeon: Muhammad A Arida, MD;  Location: [facility] CATH LAB;  Service: Cardiovascular;  Laterality: N/A;   Current Outpatient Prescriptions on File Prior to Visit  Medication Sig Dispense Refill  . aspirin EC 81 MG tablet Take 81 mg by mouth daily.      . atorvastatin (LIPITOR) 20 MG tablet Take 1 tablet (20 mg total) by mouth every evening. 90 tablet 3  . Blood Glucose Monitoring Suppl (ONE TOUCH ULTRA 2) W/DEVICE KIT Check FBS qam - Dx:250.00 and she will need lancets (1 box) , strips (1 bottle = 100) and controls also thanks 1 each 0  . budesonide-formoterol (SYMBICORT) 160-4.5 MCG/ACT inhaler Inhale 2 puffs into the lungs 2 (two) times daily. 1 Inhaler 3  . Calcium Carbonate-Vitamin D (CALCIUM-VITAMIN D) 500-200 MG-UNIT per tablet Take 1 tablet by mouth 2 (two) times daily with a meal.     . carvedilol (COREG) 12.5 MG tablet Take 1 tablet (12.5 mg total) by mouth 2 (two) times daily. 180 tablet 3  . cetirizine (ZYRTEC) 10 MG tablet Take 10 mg by mouth every evening.    . CINNAMON PO Take 1 tablet by mouth daily.      . Coenzyme Q10 (CO Q 10   PO) Take 1 capsule by mouth daily.      . cyanocobalamin 500 MCG tablet Take 500 mcg by mouth daily. 10/25/12--Pt not sure of dose    . DEXILANT 60 MG capsule TAKE 1 CAPSULE (60 MG TOTAL) BY MOUTH DAILY. 90 capsule 3  . fluticasone (FLONASE) 50 MCG/ACT nasal spray prn    . fluticasone (FLONASE) 50 MCG/ACT nasal spray INHALE 2 SPRAYS IN EACH NOSTRIL DAILY FOR 3 MONTHS 48 g 1  . Glucosamine-Chondroitin (GLUCOSAMINE CHONDR COMPLEX PO) Take 1 tablet by mouth daily.      . Multiple Vitamins-Minerals (MULTIVITAMINS THER. W/MINERALS) TABS Take 1 tablet by mouth daily.      . nitroGLYCERIN (NITROSTAT) 0.4 MG SL tablet Place 1 tablet (0.4 mg total) under the tongue every 5 (five) minutes as needed for chest pain. 25 tablet prn  . OVER THE COUNTER MEDICATION Take 1 tablet by mouth daily. Leg cramps    .  Probiotic Product (RESTORA) CAPS 1 cap po qd 30 capsule 11  . valsartan (DIOVAN) 160 MG tablet TAKE 1 AND 1/2 TABLETS (TOTAL 240MG) DAILY 135 tablet 3  . vitamin C (ASCORBIC ACID) 500 MG tablet Take 500 mg by mouth 2 (two) times daily.      . WELCHOL 625 MG tablet CMD FOR 90D TAKE 3 TABLETS (1,875 MG TOTAL) BY MOUTH 2 (TWO) TIMES DAILY WITH A MEAL. 270 tablet 3  . zafirlukast (ACCOLATE) 20 MG tablet TAKE 1 TABLET (20 MG TOTAL) BY MOUTH 2 (TWO) TIMES DAILY. 180 tablet 1   No current facility-administered medications on file prior to visit.   Allergies  Allergen Reactions  . Adhesive [Tape] Other (See Comments)    Skin turns red   . Diphenhydramine Other (See Comments)    Pt feels wired   . Vicodin [Hydrocodone-Acetaminophen] Nausea And Vomiting   History   Social History  . Marital Status: Married    Spouse Name: N/A  . Number of Children: 2  . Years of Education: N/A   Occupational History  . retired     post Furniture conservator/restorer   Social History Main Topics  . Smoking status: Never Smoker   . Smokeless tobacco: Never Used  . Alcohol Use: No  . Drug Use: No  . Sexual Activity: No   Other Topics Concern  . Not on file   Social History Narrative      Review of Systems  All other systems reviewed and are negative.      Objective:   Physical Exam  Cardiovascular: Normal rate, regular rhythm and normal heart sounds.   Pulmonary/Chest: Effort normal and breath sounds normal.  Musculoskeletal:       Right hip: She exhibits decreased strength and tenderness. She exhibits normal range of motion and no bony tenderness.       Left knee: She exhibits decreased range of motion and bony tenderness. She exhibits no swelling, no effusion, no LCL laxity, normal meniscus and no MCL laxity. Tenderness found. Medial joint line and lateral joint line tenderness noted. No patellar tendon tenderness noted.  Vitals reviewed.         Assessment & Plan:  Hip flexor tendinitis,  right - Plan: diclofenac (VOLTAREN) 75 MG EC tablet  Left knee pain  Patients left knee pain is due to osteoarthritis. Using sterile technique, we injected the left knee with a mixture of 2 mL of lidocaine, 2 mL of Marcaine, and 2 mL of 40 mg per mL Kenalog. The patient tolerated the  procedure well without complication. I believe the pain in her right hip is due to hip flexor tendinitis in the iliopsoas tendon. I recommended alternating ice and heat. I recommended rest. Also recommended diclofenac 75 mg by mouth twice a day for up to 2 weeks. Recheck at that time if no better or immediately if worse. If worsening, I would obtain an x-ray of the right hip. 

## 2015-01-01 ENCOUNTER — Other Ambulatory Visit: Payer: Self-pay | Admitting: Family Medicine

## 2015-01-01 ENCOUNTER — Telehealth: Payer: Self-pay | Admitting: Cardiology

## 2015-01-01 ENCOUNTER — Ambulatory Visit (HOSPITAL_BASED_OUTPATIENT_CLINIC_OR_DEPARTMENT_OTHER): Payer: Medicare Other | Admitting: Hematology and Oncology

## 2015-01-01 ENCOUNTER — Encounter: Payer: Self-pay | Admitting: Nurse Practitioner

## 2015-01-01 ENCOUNTER — Telehealth: Payer: Self-pay | Admitting: *Deleted

## 2015-01-01 ENCOUNTER — Other Ambulatory Visit (HOSPITAL_BASED_OUTPATIENT_CLINIC_OR_DEPARTMENT_OTHER): Payer: Medicare Other

## 2015-01-01 ENCOUNTER — Encounter: Payer: Self-pay | Admitting: Hematology and Oncology

## 2015-01-01 ENCOUNTER — Telehealth: Payer: Self-pay | Admitting: Hematology and Oncology

## 2015-01-01 VITALS — BP 131/70 | HR 92 | Temp 97.8°F | Resp 18 | Ht 62.0 in | Wt 180.5 lb

## 2015-01-01 DIAGNOSIS — R0600 Dyspnea, unspecified: Secondary | ICD-10-CM

## 2015-01-01 DIAGNOSIS — Z853 Personal history of malignant neoplasm of breast: Secondary | ICD-10-CM

## 2015-01-01 DIAGNOSIS — R0602 Shortness of breath: Secondary | ICD-10-CM

## 2015-01-01 DIAGNOSIS — R5383 Other fatigue: Secondary | ICD-10-CM | POA: Diagnosis not present

## 2015-01-01 DIAGNOSIS — C50911 Malignant neoplasm of unspecified site of right female breast: Secondary | ICD-10-CM

## 2015-01-01 DIAGNOSIS — I517 Cardiomegaly: Secondary | ICD-10-CM

## 2015-01-01 DIAGNOSIS — C50912 Malignant neoplasm of unspecified site of left female breast: Secondary | ICD-10-CM

## 2015-01-01 DIAGNOSIS — R7989 Other specified abnormal findings of blood chemistry: Secondary | ICD-10-CM

## 2015-01-01 HISTORY — DX: Other fatigue: R53.83

## 2015-01-01 LAB — CBC WITH DIFFERENTIAL/PLATELET
BASO%: 0.1 % (ref 0.0–2.0)
Basophils Absolute: 0 10*3/uL (ref 0.0–0.1)
EOS%: 0.3 % (ref 0.0–7.0)
Eosinophils Absolute: 0 10*3/uL (ref 0.0–0.5)
HEMATOCRIT: 42.2 % (ref 34.8–46.6)
HEMOGLOBIN: 13.8 g/dL (ref 11.6–15.9)
LYMPH%: 23.9 % (ref 14.0–49.7)
MCH: 31.1 pg (ref 25.1–34.0)
MCHC: 32.6 g/dL (ref 31.5–36.0)
MCV: 95.3 fL (ref 79.5–101.0)
MONO#: 0.9 10*3/uL (ref 0.1–0.9)
MONO%: 8.5 % (ref 0.0–14.0)
NEUT%: 67.2 % (ref 38.4–76.8)
NEUTROS ABS: 7.4 10*3/uL — AB (ref 1.5–6.5)
Platelets: 264 10*3/uL (ref 145–400)
RBC: 4.43 10*6/uL (ref 3.70–5.45)
RDW: 12.7 % (ref 11.2–14.5)
WBC: 11 10*3/uL — ABNORMAL HIGH (ref 3.9–10.3)
lymph#: 2.6 10*3/uL (ref 0.9–3.3)

## 2015-01-01 LAB — COMPREHENSIVE METABOLIC PANEL (CC13)
ALT: 22 U/L (ref 0–55)
AST: 8 U/L (ref 5–34)
Albumin: 3.6 g/dL (ref 3.5–5.0)
Alkaline Phosphatase: 70 U/L (ref 40–150)
Anion Gap: 12 mEq/L — ABNORMAL HIGH (ref 3–11)
BUN: 16.4 mg/dL (ref 7.0–26.0)
CO2: 22 mEq/L (ref 22–29)
CREATININE: 0.7 mg/dL (ref 0.6–1.1)
Calcium: 9.1 mg/dL (ref 8.4–10.4)
Chloride: 106 mEq/L (ref 98–109)
EGFR: 82 mL/min/{1.73_m2} — ABNORMAL LOW (ref >90)
GLUCOSE: 95 mg/dL (ref 70–140)
Potassium: 4 mEq/L (ref 3.5–5.1)
SODIUM: 140 meq/L (ref 136–145)
TOTAL PROTEIN: 6.4 g/dL (ref 6.4–8.3)
Total Bilirubin: 0.4 mg/dL (ref 0.20–1.20)

## 2015-01-01 LAB — TSH CHCC: TSH: 1.38 m[IU]/L (ref 0.308–3.960)

## 2015-01-01 NOTE — Telephone Encounter (Signed)
Pt c/o Shortness Of Breath: STAT if SOB developed within the last 24 hours or pt is noticeably SOB on the phone  1. Are you currently SOB (can you hear that pt is SOB on the phone)? "Pt stated she don't know"  2. How long have you been experiencing SOB?4-5 days  3. Are you SOB when sitting or when up moving around? Both  4. Are you currently experiencing any other symptoms? Tired and very exhausted

## 2015-01-01 NOTE — Assessment & Plan Note (Signed)
Clinically, she has no signs of cancer recurrence. I will discharge her from the clinic. The patient had genetic screening test was negative for genetic mutation as a cause of her bilateral breast cancer diagnosis.

## 2015-01-01 NOTE — Telephone Encounter (Signed)
-----   Message from Heath Lark, MD sent at 01/01/2015  1:58 PM EDT ----- Regarding: tests Please tell her all labs and CXR are normal

## 2015-01-01 NOTE — Telephone Encounter (Signed)
Informed pt of labs and cxr normal.  She verbalized understanding.

## 2015-01-01 NOTE — Telephone Encounter (Signed)
Refill appropriate and filled per protocol. 

## 2015-01-01 NOTE — Telephone Encounter (Signed)
Gave avs. Sent patient for labs & xray

## 2015-01-01 NOTE — Assessment & Plan Note (Signed)
Clinically, she has no cancer recurrence. I will discharge the patient from the clinic

## 2015-01-01 NOTE — Progress Notes (Signed)
[facility] progress notes  Patient Care Team: Susy Frizzle, MD as PCP - General (Family Medicine) Larey Dresser, MD as Consulting Physician (Cardiology) Heath Lark, MD as Consulting Physician (Hematology and Oncology)  CHIEF COMPLAINTS/PURPOSE OF VISIT:  History of bilateral breast cancer  HISTORY OF PRESENTING ILLNESS:  [name] 69 y.o. female was transferred to my care after her prior physician has left.  I reviewed the patient's records extensive and collaborated the history with the patient. Summary of her history is as follows: Followup visit for this pleasant woman with a history of metachronous primary bilateral breast cancers.  Initial stage II, 1.5 cm,  ER negative,  one node positive,  cancer left breast diagnosed October 1999 treated with mastectomy with immediate TRAM reconstruction followed by 4 cycles of Adriamycin Cytoxan chemotherapy then 5 years of tamoxifen hormonal therapy.  Second primary 3.2 cm, ER PR positive, HER-2 negative, one node positive, cancer of the right breast diagnosed in April 2008 status post right mastectomy 02/04/2007 followed by 4 cycles of carboplatinum plus Taxotere chemotherapy given between May 28th and 05/04/2007. Initial Arimidex hormonal therapy changed to Aromasin due to poor tolerance of Arimidex. She completed 5 years of hormonal therapy in July 2013.  She had small lymphadenopathy over left supraclavicular region that has been biopsied twice on 12/08/2007 and again on 08/17/2011 with no malignant diagnosis obtained. She has had followup CT scans of her neck and the area remains unchanged. Most recent CT neck done one month prior to most recent biopsy on 07/16/2011. She was found to have mild cardiomyopathy with last echocardiogram in July 2015 and follow closely with cardiologist. She have asthma with chronic shortness of breath on exertion. She was found to have borderline diabetes but her metformin was  discontinued due to poor tolerance. She denies any recent abnormal breast examination, palpable mass, abnormal breast appearance or nipple changes She is up-to-date with all screening modality. Genetic screening reportedly was negative per patient.  MEDICAL HISTORY:  Past Medical History  Diagnosis Date  . Cancer     breast ca  . Asthma   . GERD (gastroesophageal reflux disease)   . DM2 (diabetes mellitus, type 2)   . Arthritis   . Allergy history unknown   . Hiatal hernia   . Leg cramps   . Hypertension   . Elevated cholesterol   . NICM (nonischemic cardiomyopathy)     a. Echo:  06/14/12: Poor endocardial definition, possible septal HK, EF 50-55%, normal wall motion, mild LAE ;  b.  [facility] 9/13:  normal cors, EF 35-40%,  c. Echo 7/14: EF 50-55%, mild LAE  . LBBB (left bundle branch block) 10/27/2012  . Breast cancer, left breast 10/27/2012    3.2 cm ER positive, Her-2 negative, 1 node positive S/P mastectomy/TTRAM reconstruction 4/08  . Breast cancer, right breast 10/27/2012    3.2 cm ER/PR positive, 1 node positive, Her-2 negative Rx w mastectomy, AC -T chemo, followed by aromatase inhibitor x 5 years dx 4/08  . Diabetes   . Other fatigue 01/01/2015    SURGICAL HISTORY: Past Surgical History  Procedure Laterality Date  . Mastectomy      bilateral  . Knee surgery      left  . Dilation and curettage of uterus    . Left heart catheterization with coronary angiogram N/A 06/15/2012    Procedure: LEFT HEART CATHETERIZATION WITH CORONARY ANGIOGRAM;  Surgeon: Wellington Hampshire, MD;  Location: [facility];  Service: Cardiovascular;  Laterality: N/A;    SOCIAL HISTORY: History   Social History  . Marital Status: Married    Spouse Name: N/A  . Number of Children: 2  . Years of Education: N/A   Occupational History  . retired     post Furniture conservator/restorer   Social History Main Topics  . Smoking status: Never Smoker   . Smokeless tobacco: Never Used  . Alcohol Use: No  . Drug  Use: No  . Sexual Activity: No   Other Topics Concern  . Not on file   Social History Narrative    FAMILY HISTORY: Family History  Problem Relation Age of Onset  . Cancer Maternal Grandmother     stomach/breast  . Heart attack Paternal Grandfather   . Asthma Maternal Grandmother     ALLERGIES:  is allergic to adhesive; diphenhydramine; and vicodin.  MEDICATIONS:  Current Outpatient Prescriptions  Medication Sig Dispense Refill  . aspirin EC 81 MG tablet Take 81 mg by mouth daily.      . Blood Glucose Monitoring Suppl (ONE TOUCH ULTRA 2) W/DEVICE KIT Check FBS qam - Dx:250.00 and she will need lancets (1 box) , strips (1 bottle = 100) and controls also thanks 1 each 0  . budesonide-formoterol (SYMBICORT) 160-4.5 MCG/ACT inhaler Inhale 2 puffs into the lungs 2 (two) times daily. 1 Inhaler 3  . Calcium Carbonate-Vitamin D (CALCIUM-VITAMIN D) 500-200 MG-UNIT per tablet Take 1 tablet by mouth 2 (two) times daily with a meal.     . carvedilol (COREG) 12.5 MG tablet Take 1 tablet (12.5 mg total) by mouth 2 (two) times daily. 180 tablet 3  . cetirizine (ZYRTEC) 10 MG tablet Take 10 mg by mouth every evening.    Marland Kitchen CINNAMON PO Take 1 tablet by mouth daily.      . Coenzyme Q10 (CO Q 10 PO) Take 1 capsule by mouth daily.      . cyanocobalamin 500 MCG tablet Take 500 mcg by mouth daily. 10/25/12--Pt not sure of dose    . fluticasone (FLONASE) 50 MCG/ACT nasal spray prn    . fluticasone (FLONASE) 50 MCG/ACT nasal spray INHALE 2 SPRAYS IN EACH NOSTRIL DAILY FOR 3 MONTHS 48 g 1  . Glucosamine-Chondroitin (GLUCOSAMINE CHONDR COMPLEX PO) Take 1 tablet by mouth daily.      . Multiple Vitamins-Minerals (MULTIVITAMINS THER. W/MINERALS) TABS Take 1 tablet by mouth daily.      . nitroGLYCERIN (NITROSTAT) 0.4 MG SL tablet Place 1 tablet (0.4 mg total) under the tongue every 5 (five) minutes as needed for chest pain. 25 tablet prn  . OVER THE COUNTER MEDICATION Take 1 tablet by mouth daily. Leg cramps     . Probiotic Product (RESTORA) CAPS 1 cap po qd 30 capsule 11  . valsartan (DIOVAN) 160 MG tablet TAKE 1 AND 1/2 TABLETS (TOTAL 240MG) DAILY 135 tablet 3  . vitamin C (ASCORBIC ACID) 500 MG tablet Take 500 mg by mouth 2 (two) times daily.      . WELCHOL 625 MG tablet CMD FOR 90D TAKE 3 TABLETS (1,875 MG TOTAL) BY MOUTH 2 (TWO) TIMES DAILY WITH A MEAL. 270 tablet 3  . zafirlukast (ACCOLATE) 20 MG tablet TAKE 1 TABLET (20 MG TOTAL) BY MOUTH 2 (TWO) TIMES DAILY. 180 tablet 1  . atorvastatin (LIPITOR) 20 MG tablet TAKE 2 TABLETS (40 MG TOTAL) BY MOUTH EVERY EVENING. 60 tablet 2  . DEXILANT 60 MG capsule TAKE 1 CAPSULE (60 MG TOTAL) BY MOUTH DAILY.  90 capsule 3  . diclofenac (VOLTAREN) 75 MG EC tablet Take 1 tablet (75 mg total) by mouth 2 (two) times daily. (Patient not taking: Reported on 01/01/2015) 60 tablet 0   No current facility-administered medications for this visit.    REVIEW OF SYSTEMS:   Constitutional: Denies fevers, chills or abnormal night sweats Eyes: Denies blurriness of vision, double vision or watery eyes Ears, nose, mouth, throat, and face: Denies mucositis or sore throat Cardiovascular: Denies palpitation, chest discomfort or lower extremity swelling Gastrointestinal:  Denies nausea, heartburn or change in bowel habits Skin: Denies abnormal skin rashes Lymphatics: Denies new lymphadenopathy or easy bruising Neurological:Denies numbness, tingling or new weaknesses Behavioral/Psych: Mood is stable, no new changes  All other systems were reviewed with the patient and are negative.  PHYSICAL EXAMINATION: ECOG PERFORMANCE STATUS: 1 - Symptomatic but completely ambulatory  Filed Vitals:   01/01/15 1154  BP: 131/70  Pulse: 92  Temp: 97.8 F (36.6 C)  Resp: 18   Filed Weights   01/01/15 1154  Weight: 180 lb 8 oz (81.874 kg)    GENERAL:alert, no distress and comfortable SKIN: skin color, texture, turgor are normal, no rashes or significant lesions EYES: normal,  conjunctiva are pink and non-injected, sclera clear OROPHARYNX:no exudate, normal lips, buccal mucosa, and tongue  NECK: supple, thyroid normal size, non-tender, without nodularity LYMPH:  no palpable lymphadenopathy in the cervical, axillary or inguinal LUNGS: clear to auscultation and percussion with normal breathing effort HEART: regular rate & rhythm and no murmurs without lower extremity edema ABDOMEN:abdomen soft, non-tender and normal bowel sounds Musculoskeletal:no cyanosis of digits and no clubbing  PSYCH: alert & oriented x 3 with fluent speech NEURO: no focal motor/sensory deficits Chest wall examination was performed. Well normal mastectomy scar with no other abnormalities. She had TRAM flap reconstruction surgery on the left breast. LABORATORY DATA:  I have reviewed the data as listed Lab Results  Component Value Date   WBC 11.0* 01/01/2015   HGB 13.8 01/01/2015   HCT 42.2 01/01/2015   MCV 95.3 01/01/2015   PLT 264 01/01/2015    Recent Labs  01/26/14 0845 03/26/14 1203 07/10/14 1054 01/01/15 1230  NA 142 141 139 140  K 4.1 3.7 4.2 4.0  CL 108 108 105  --   CO2 24 26 26 22   GLUCOSE 102* 104* 116* 95  BUN 22 19 16  16.4  CREATININE 0.64 0.8 0.73 0.7  CALCIUM 8.9 9.3 9.2 9.1  GFRNONAA >89  --  84  --   GFRAA >89  --  >89  --   PROT 6.6  --  6.8 6.4  ALBUMIN 4.0  --  4.2 3.6  AST 17  --  21 8  ALT 21  --  46* 22  ALKPHOS 85  --  80 70  BILITOT 0.5  --  0.4 0.40   CXR today is negative  ASSESSMENT & PLAN:  Breast cancer, left breast Clinically, she has no cancer recurrence. I will discharge the patient from the clinic   Breast cancer, right breast Clinically, she has no signs of cancer recurrence. I will discharge her from the clinic. The patient had genetic screening test was negative for genetic mutation as a cause of her bilateral breast cancer diagnosis.   DYSPNEA The patient have chronic shortness of breath on minimal exertion. Her last  echocardiogram showed diastolic dysfunction and mild cardiomyopathy. I reassured the patient, in view of absence of anemia and a normal chest x-ray, the most likely  cause that contributes to her shortness of breath is likely cardiac in origin. I recommend she consult with PCP and cardiologist for further management.   Other fatigue She has chronic fatigue. I will check thyroid function tests and will call the patient with test results. I suspect this is cardiac in nature.    Orders Placed This Encounter  Procedures  . DG Chest 2 View    Standing Status: Future     Number of Occurrences: 1     Standing Expiration Date: 02/05/2016    Order Specific Question:  Reason for exam:    Answer:  SOB, Hx breast ca    Order Specific Question:  Preferred imaging location?    Answer:  [facility]  . CBC with Differential/Platelet    Standing Status: Future     Number of Occurrences: 1     Standing Expiration Date: 02/05/2016  . Comprehensive metabolic panel    Standing Status: Future     Number of Occurrences: 1     Standing Expiration Date: 02/05/2016  . TSH    Standing Status: Future     Number of Occurrences: 1     Standing Expiration Date: 02/05/2016    All questions were answered. The patient knows to call the clinic with any problems, questions or concerns. I spent 15 minutes counseling the patient face to face. The total time spent in the appointment was 20 minutes and more than 50% was on counseling.     [facility], [place], MD 01/01/2015 1:39 PM

## 2015-01-01 NOTE — Assessment & Plan Note (Signed)
The patient have chronic shortness of breath on minimal exertion. Her last echocardiogram showed diastolic dysfunction and mild cardiomyopathy. I reassured the patient, in view of absence of anemia and a normal chest x-ray, the most likely cause that contributes to her shortness of breath is likely cardiac in origin. I recommend she consult with PCP and cardiologist for further management.

## 2015-01-01 NOTE — Assessment & Plan Note (Signed)
She has chronic fatigue. I will check thyroid function tests and will call the patient with test results. I suspect this is cardiac in nature.

## 2015-01-01 NOTE — Telephone Encounter (Signed)
Patient calling to report increase in fatigue and SOB even at rest. Reports labored breathing over past several days. States she recently was on an airplane and had to "gasp" for an hour to keep her breath. BP 130/70, HR 60. Denies CP, palpitations, edema, cough, arm/jaw pain, dizziness. She had an appointment today and labs were drawn and a CXR done.   The following the note from the Dr. Alvy Bimler visit today (3/29)/Oncology:  "The patient have chronic shortness of breath on minimal exertion. Her last echocardiogram showed diastolic dysfunction and mild cardiomyopathy. I reassured the patient, in view of absence of anemia and a normal chest x-ray, the most likely cause that contributes to her shortness of breath is likely cardiac in origin. I recommend she consult with PCP and cardiologist for further management."  Patient is scheduled to see Truitt Merle, NP, tomorrow, 01/02/15 @0900 . Patient verbalized understanding and appreciation for appointment."

## 2015-01-02 ENCOUNTER — Encounter: Payer: Self-pay | Admitting: Nurse Practitioner

## 2015-01-02 ENCOUNTER — Ambulatory Visit (INDEPENDENT_AMBULATORY_CARE_PROVIDER_SITE_OTHER)
Admission: RE | Admit: 2015-01-02 | Discharge: 2015-01-02 | Disposition: A | Discharge status: Home / Self Care | Payer: Medicare Other | Source: Ambulatory Visit | Attending: Nurse Practitioner | Admitting: Nurse Practitioner

## 2015-01-02 ENCOUNTER — Ambulatory Visit (INDEPENDENT_AMBULATORY_CARE_PROVIDER_SITE_OTHER): Payer: Medicare Other | Admitting: Nurse Practitioner

## 2015-01-02 ENCOUNTER — Ambulatory Visit (HOSPITAL_COMMUNITY)
Admission: RE | Admit: 2015-01-02 | Discharge: 2015-01-02 | Disposition: A | Discharge status: Home / Self Care | Payer: Medicare Other | Source: Ambulatory Visit | Attending: Hematology and Oncology | Admitting: Hematology and Oncology

## 2015-01-02 ENCOUNTER — Ambulatory Visit (HOSPITAL_BASED_OUTPATIENT_CLINIC_OR_DEPARTMENT_OTHER): Payer: Medicare Other

## 2015-01-02 VITALS — BP 106/62 | HR 79 | Ht 62.0 in | Wt 179.8 lb

## 2015-01-02 DIAGNOSIS — I429 Cardiomyopathy, unspecified: Secondary | ICD-10-CM | POA: Diagnosis not present

## 2015-01-02 DIAGNOSIS — R791 Abnormal coagulation profile: Secondary | ICD-10-CM

## 2015-01-02 DIAGNOSIS — R0602 Shortness of breath: Secondary | ICD-10-CM | POA: Diagnosis not present

## 2015-01-02 DIAGNOSIS — I428 Other cardiomyopathies: Secondary | ICD-10-CM

## 2015-01-02 DIAGNOSIS — E785 Hyperlipidemia, unspecified: Secondary | ICD-10-CM

## 2015-01-02 DIAGNOSIS — R5383 Other fatigue: Secondary | ICD-10-CM

## 2015-01-02 DIAGNOSIS — C50911 Malignant neoplasm of unspecified site of right female breast: Secondary | ICD-10-CM

## 2015-01-02 DIAGNOSIS — Z853 Personal history of malignant neoplasm of breast: Secondary | ICD-10-CM | POA: Diagnosis not present

## 2015-01-02 DIAGNOSIS — R7989 Other specified abnormal findings of blood chemistry: Secondary | ICD-10-CM

## 2015-01-02 LAB — BASIC METABOLIC PANEL
BUN: 20 mg/dL (ref 6–23)
CO2: 29 mEq/L (ref 19–32)
Calcium: 9.5 mg/dL (ref 8.4–10.5)
Chloride: 105 mEq/L (ref 96–112)
Creatinine, Ser: 0.79 mg/dL (ref 0.40–1.20)
GFR: 76.32 mL/min (ref >60.00)
Glucose, Bld: 116 mg/dL — ABNORMAL HIGH (ref 70–99)
Potassium: 4.1 mEq/L (ref 3.5–5.1)
Sodium: 137 mEq/L (ref 135–145)

## 2015-01-02 LAB — D-DIMER, QUANTITATIVE: D-Dimer, Quant: 0.56 ug/mL-FEU — ABNORMAL HIGH (ref 0.00–0.48)

## 2015-01-02 LAB — BRAIN NATRIURETIC PEPTIDE: Pro B Natriuretic peptide (BNP): 46 pg/mL (ref 0.0–100.0)

## 2015-01-02 MED ORDER — FUROSEMIDE 20 MG PO TABS: 20.0000 mg | ORAL_TABLET | Freq: Every day | ORAL | Status: DC

## 2015-01-02 MED ORDER — IOHEXOL 350 MG/ML SOLN
75.0000 mL | Freq: Once | INTRAVENOUS | Status: AC | PRN
  Administered 2015-01-02: 75 mL via INTRAVENOUS

## 2015-01-02 NOTE — Addendum Note (Signed)
Addended by: Eulis Foster on: 01/02/2015 10:03 AM   Modules accepted: Orders

## 2015-01-02 NOTE — Addendum Note (Signed)
Addended by: Leanord Asal T on: 01/02/2015 02:06 PM   Modules accepted: Orders

## 2015-01-02 NOTE — Progress Notes (Signed)
Limited 2D Echo completed. 01/02/2015

## 2015-01-02 NOTE — Patient Instructions (Addendum)
We will be checking the following labs today BNP and a STAT d dimer  If your d dimer is +, you will need to have a scan on your lungs  I am adding Lasix 20 mg - this is a fluid pill - take one every day - start today.   We will arrange for a CPX (Cardiopulmonary stress) test to see where your shortness of breath is coming from  We will get a Limited echo ? Today YOU HAVE AN APPT TODAY AT 12:00 FOR LIMITED ECHO  BMET in one week  Call the [facility] office at [phone] if you have any questions, problems or concerns.

## 2015-01-02 NOTE — Telephone Encounter (Signed)
-----   Message from Burtis Junes, NP sent at 01/02/2015 11:36 AM EDT ----- Ok to report. This is a little elevated. She is going to need a CT angio of the chest - PE protocol - ok to do here if can get scheduled.  I need to know where the BNP is too????

## 2015-01-02 NOTE — Progress Notes (Addendum)
CARDIOLOGY OFFICE NOTE  Date:  01/02/2015    [name] Date of Birth: 07-10-1946 Medical Record #[id number]  PCP:  Odette Fraction, MD  Cardiologist:  Aundra Dubin    Chief Complaint  Patient presents with  . Shortness of Breath    Work in visit - seen for Dr. Aundra Dubin    History of Present Illness: [name] is a 69 y.o. female who presents today for a work in visit. Seen for Dr. Aundra Dubin. She has a history of a transient LBBB/IVCD, breast cancer, GERD, type 2 DM, HTN, asthma, remote PE in 2001, HLD and NICM. EF in 2013 was 35 to 40% by cath with no angiographic CAD - MRI calculated EF of 50% with global hypokinesis. Echo read as 50 to 55% but TDS. Last echo from July of 2015 showed normal EF.  It has been questioned if her cardiomyopathy is related to her IVCD/LBBB.   I saw her back in June of 2015 after a spell of "her heart not doing right". Sounded like she was having symptomatic rate related LBBB. We increased her Coreg to try and block her down. Updated her echo as well - this showed a normal EF.  Last seen by Dr. Aundra Dubin back in December. Felt to be doing ok.   Phone call yesterday - "Patient calling to report increase in fatigue and SOB even at rest. Reports labored breathing over past several days. States she recently was on an airplane and had to "gasp" for an hour to keep her breath. BP 130/70, HR 60. Denies CP, palpitations, edema, cough, arm/jaw pain, dizziness. She had an appointment today and labs were drawn and a CXR done.  The following the note from the Dr. Alvy Bimler visit today (3/29)/Oncology:  "The patient have chronic shortness of breath on minimal exertion. Her last echocardiogram showed diastolic dysfunction and mild cardiomyopathy. I reassured the patient, in view of absence of anemia and a normal chest x-ray, the most likely cause that contributes to her shortness of breath is likely cardiac in origin. I recommend she consult with PCP and cardiologist for  further management." Patient is scheduled to see Truitt Merle, NP, tomorrow, 01/02/15 _0 . Patient verbalized understanding and appreciation for appointment."  Thus added to the FLEX for today.   Comes back today. Here alone today. She has not felt well for the past couple of weeks. Has traveled to [facility] - this involved 2 8 hour flights. She was very short of breath during the first flight. She is no better - may be getting a little worse. No real cough. Little swelling. Weight is fairly stable. She is very tired and fatigued. Remains short of breath with activity. No hemoptysis. She has a monitor strip showing LBBB. EKG today with LBBB - this has typically been a rate related phenomenon. She has had prior PE.   Past Medical History  Diagnosis Date  . Cancer     breast ca  . Asthma   . GERD (gastroesophageal reflux disease)   . DM2 (diabetes mellitus, type 2)   . Arthritis   . Allergy history unknown   . Hiatal hernia   . Leg cramps   . Hypertension   . Elevated cholesterol   . NICM (nonischemic cardiomyopathy)     a. Echo:  06/14/12: Poor endocardial definition, possible septal HK, EF 50-55%, normal wall motion, mild LAE ;  b.  [facility] 9/13:  normal cors, EF 35-40%,  c. Echo 7/14: EF 50-55%, mild  LAE  . LBBB (left bundle branch block) 10/27/2012  . Breast cancer, left breast 10/27/2012    3.2 cm ER positive, Her-2 negative, 1 node positive S/P mastectomy/TTRAM reconstruction 4/08  . Breast cancer, right breast 10/27/2012    3.2 cm ER/PR positive, 1 node positive, Her-2 negative Rx w mastectomy, AC -T chemo, followed by aromatase inhibitor x 5 years dx 4/08  . Diabetes   . Other fatigue 01/01/2015   PMH: 1. LBBB/IVCD  2. Breast cancer s/p bilateral mastectomy. 3. GERD 4. Type II diabetes mellitus 5. HTN 6. Asthma 7. PE in 2001 8. Hyperlipidemia 9. Nonischemic Cardiomyopathy: Echo 8/09 with 60%. Admitted in 9/13 with chest pain. LHC showed no angiographic CAD and EF 35-40%. Echo at  that time was read as showing EF 50-55% but was a technically difficult study. Echo (12/13): EF 40% with septal hypokinesis, normal RV size and systolic function, no significant valvular abnormalities. Cardiac MRI (12/13): EF 50%, global hypokinesis, no delayed enhancement. Echo (7/14) with EF 50-55%, mild LV dilation, mild LAE. Echo (7/15) with EF 55-60%, mild MR and mild TR.    Past Surgical History  Procedure Laterality Date  . Mastectomy      bilateral  . Knee surgery      left  . Dilation and curettage of uterus    . Left heart catheterization with coronary angiogram N/A 06/15/2012    Procedure: LEFT HEART CATHETERIZATION WITH CORONARY ANGIOGRAM;  Surgeon: Wellington Hampshire, MD;  Location: [facility];  Service: Cardiovascular;  Laterality: N/A;     Medications: Current Outpatient Prescriptions  Medication Sig Dispense Refill  . aspirin EC 81 MG tablet Take 81 mg by mouth daily.      Marland Kitchen atorvastatin (LIPITOR) 20 MG tablet TAKE 2 TABLETS (40 MG TOTAL) BY MOUTH EVERY EVENING. 60 tablet 2  . Blood Glucose Monitoring Suppl (ONE TOUCH ULTRA 2) W/DEVICE KIT Check FBS qam - Dx:250.00 and she will need lancets (1 box) , strips (1 bottle = 100) and controls also thanks 1 each 0  . budesonide-formoterol (SYMBICORT) 160-4.5 MCG/ACT inhaler Inhale 2 puffs into the lungs 2 (two) times daily. 1 Inhaler 3  . Calcium Carbonate-Vitamin D (CALCIUM-VITAMIN D) 500-200 MG-UNIT per tablet Take 1 tablet by mouth 2 (two) times daily with a meal.     . carvedilol (COREG) 12.5 MG tablet Take 1 tablet (12.5 mg total) by mouth 2 (two) times daily. 180 tablet 3  . cetirizine (ZYRTEC) 10 MG tablet Take 10 mg by mouth every evening.    Marland Kitchen CINNAMON PO Take 1 tablet by mouth daily.      . Coenzyme Q10 (CO Q 10 PO) Take 1 capsule by mouth daily.      . cyanocobalamin 500 MCG tablet Take 500 mcg by mouth daily. 10/25/12--Pt not sure of dose    . DEXILANT 60 MG capsule TAKE 1 CAPSULE (60 MG TOTAL) BY MOUTH DAILY. 90  capsule 3  . diclofenac (VOLTAREN) 75 MG EC tablet Take 1 tablet (75 mg total) by mouth 2 (two) times daily. 60 tablet 0  . fluticasone (FLONASE) 50 MCG/ACT nasal spray prn    . fluticasone (FLONASE) 50 MCG/ACT nasal spray INHALE 2 SPRAYS IN EACH NOSTRIL DAILY FOR 3 MONTHS 48 g 1  . Glucosamine-Chondroitin (GLUCOSAMINE CHONDR COMPLEX PO) Take 1 tablet by mouth daily.      . Multiple Vitamins-Minerals (MULTIVITAMINS THER. W/MINERALS) TABS Take 1 tablet by mouth daily.      . nitroGLYCERIN (NITROSTAT) 0.4  MG SL tablet Place 1 tablet (0.4 mg total) under the tongue every 5 (five) minutes as needed for chest pain. 25 tablet prn  . OVER THE COUNTER MEDICATION Take 1 tablet by mouth daily. Leg cramps    . Probiotic Product (RESTORA) CAPS 1 cap po qd 30 capsule 11  . valsartan (DIOVAN) 160 MG tablet TAKE 1 AND 1/2 TABLETS (TOTAL 240MG) DAILY 135 tablet 3  . vitamin C (ASCORBIC ACID) 500 MG tablet Take 500 mg by mouth 2 (two) times daily.      . WELCHOL 625 MG tablet CMD FOR 90D TAKE 3 TABLETS (1,875 MG TOTAL) BY MOUTH 2 (TWO) TIMES DAILY WITH A MEAL. 270 tablet 3  . zafirlukast (ACCOLATE) 20 MG tablet TAKE 1 TABLET (20 MG TOTAL) BY MOUTH 2 (TWO) TIMES DAILY. 180 tablet 1  . furosemide (LASIX) 20 MG tablet Take 1 tablet (20 mg total) by mouth daily. 30 tablet 6   No current facility-administered medications for this visit.    Allergies: Allergies  Allergen Reactions  . Adhesive [Tape] Other (See Comments)    Skin turns red   . Diphenhydramine Other (See Comments)    Pt feels wired   . Vicodin [Hydrocodone-Acetaminophen] Nausea And Vomiting    Social History: The patient  reports that she has never smoked. She has never used smokeless tobacco. She reports that she does not drink alcohol or use illicit drugs.   Family History: The patient's family history includes Asthma in her maternal grandmother; Cancer in her maternal grandmother; Heart attack in her paternal grandfather.   Review of  Systems: Please see the history of present illness.   Otherwise, the review of systems is positive for fatigue, dyspnea.   All other systems are reviewed and negative.   Physical Exam: VS:  BP 106/62 mmHg  Pulse 79  Ht _0  (1.575 m)  Wt 179 lb 12.8 oz (81.557 kg)  BMI 32.88 kg/m2 .  BMI Body mass index is 32.88 kg/(m^2).  Wt Readings from Last 3 Encounters:  01/02/15 179 lb 12.8 oz (81.557 kg)  01/01/15 180 lb 8 oz (81.874 kg)  12/27/14 182 lb (82.555 kg)    General: Pleasant. Well developed, well nourished and in no acute distress. She is obese. HEENT: Normal. Neck: Supple, no JVD, carotid bruits, or masses noted.  Cardiac: Regular rate and rhythm. No murmurs, rubs, or gallops. No edema.  Respiratory:  Lungs are clear to auscultation bilaterally with normal work of breathing.  GI: Soft and nontender.  MS: No deformity or atrophy. Gait and ROM intact. Skin: Warm and dry. Color is normal.  Neuro:  Strength and sensation are intact and no gross focal deficits noted.  Psych: Alert, appropriate and with normal affect.   LABORATORY DATA:  EKG:  EKG is ordered today. This demonstrates NSR with LBBB  Lab Results  Component Value Date   WBC 11.0* 01/01/2015   HGB 13.8 01/01/2015   HCT 42.2 01/01/2015   PLT 264 01/01/2015   GLUCOSE 95 01/01/2015   CHOL 115 07/10/2014   TRIG 137 07/10/2014   HDL 43 07/10/2014   LDLCALC 45 07/10/2014   ALT 22 01/01/2015   AST 8 01/01/2015   NA 140 01/01/2015   K 4.0 01/01/2015   CL 105 07/10/2014   CREATININE 0.7 01/01/2015   BUN 16.4 01/01/2015   CO2 22 01/01/2015   TSH 1.380 01/01/2015   INR 1.14 06/14/2012   HGBA1C 6.7* 07/10/2014    BNP (last 3 results)  No results for input(s): BNP in the last 8760 hours.  ProBNP (last 3 results)  Recent Labs  03/26/14 1203  PROBNP 50.0     Other Studies Reviewed Today:  Echo Study Conclusions from July of 2015  - Left ventricle: The cavity size was normal. Wall thickness  was normal. Systolic function was normal. The estimated ejection fraction was in the range of 55% to 60%. Regional wall motion abnormalities cannot be excluded. Doppler parameters are consistent with abnormal left ventricular relaxation (grade 1 diastolic dysfunction). The E/e&' ratio is between 8-15, suggesting indeterminate LV filling pressure. - Mitral valve: Calcified annulus. There was mild regurgitation. - Left atrium: Moderately dilated 42 ml/m2. - Tricuspid valve: There was mild regurgitation. - Pulmonary arteries: PA peak pressure: 23 mm Hg (S).  Impressions:  - Compared to the prior echo in 04/2013, the EF has normalized.  Assessment/Plan: 1. Dyspnea - has history of NICM but last echo with normal EF. Now with LBBB on EKG - EF may have dropped - will check limited echo to get an EF. Arrange for CPX - spoke with Dr. Aundra Dubin who said the LBBB will not be contraindication for CPX testing. Checking BNP today. Start Lasix 20 mg a day. Checking d dimer due to recent long air travel.   2. Chronic fatigue  3. Transient LBBB - on today's EKG as well  4. HTN - BP is good on current regimen.  5. NICM with recovery of LV function per recent echo.   Current medicines are reviewed with the patient today.  The patient does not have concerns regarding medicines other than what has been noted above.  The following changes have been made:  See above.  Labs/ tests ordered today include:    Orders Placed This Encounter  Procedures  . Cardiopulmonary exercise test  . Brain natriuretic peptide  . D-Dimer, Quantitative  . Basic metabolic panel  . EKG 12-Lead  . 2D Echocardiogram without contrast     Disposition:   Further disposition to follow  Patient is agreeable to this plan and will call if any problems develop in the interim.   Signed: Burtis Junes, RN, ANP-C 01/02/2015 9:32 AM  [facility] [address] [place], [place]  [address] Phone: [phone] Fax: [phone]       Addendum: 01/04/2015 Have talked with Dr. Aundra Dubin as well as Dr. Caryl Comes. Will try to increase her Coreg to 18.75 BID. Arrange for the CPX. Dr. Aundra Dubin would like to follow in the HF clinic after CPX for further discussion. She will have to go thru 3 months of medical therapy with repeat echo prior to CRT therapy.  Our hope is that her LBBB is still rate related and that with an increased dose of Coreg, her HR will decrease and thus have improved symptoms. She is on ARB therapy with Diovan.   I have spoken to the patient on the phone this afternoon. CPX for 4/18. Will need follow up visit in CHF clinic afterwards.   Burtis Junes, RN, [facility] [address] [place], [place]  [address] [phone]

## 2015-01-04 ENCOUNTER — Other Ambulatory Visit: Payer: Self-pay

## 2015-01-04 DIAGNOSIS — R0602 Shortness of breath: Secondary | ICD-10-CM

## 2015-01-09 ENCOUNTER — Other Ambulatory Visit (INDEPENDENT_AMBULATORY_CARE_PROVIDER_SITE_OTHER): Payer: Medicare Other | Admitting: *Deleted

## 2015-01-09 DIAGNOSIS — R0602 Shortness of breath: Secondary | ICD-10-CM | POA: Diagnosis not present

## 2015-01-09 LAB — BASIC METABOLIC PANEL
BUN: 16 mg/dL (ref 6–23)
CO2: 28 mEq/L (ref 19–32)
Calcium: 8.9 mg/dL (ref 8.4–10.5)
Chloride: 105 mEq/L (ref 96–112)
Creatinine, Ser: 0.78 mg/dL (ref 0.40–1.20)
GFR: 77.44 mL/min (ref >60.00)
Glucose, Bld: 90 mg/dL (ref 70–99)
Potassium: 4.1 mEq/L (ref 3.5–5.1)
Sodium: 139 mEq/L (ref 135–145)

## 2015-01-15 ENCOUNTER — Telehealth: Payer: Self-pay | Admitting: Cardiology

## 2015-01-15 NOTE — Telephone Encounter (Signed)
Pt called. HR fast at home "160". She is better now. I suggested she take an extra 6.25 mg of Coreg. If her HR is still fast she should come to the ED.  Kerin Ransom PA-C 01/15/2015 7:53 PM

## 2015-01-16 ENCOUNTER — Encounter: Payer: Self-pay | Admitting: Physician Assistant

## 2015-01-16 ENCOUNTER — Telehealth: Payer: Self-pay | Admitting: Nurse Practitioner

## 2015-01-16 ENCOUNTER — Ambulatory Visit (INDEPENDENT_AMBULATORY_CARE_PROVIDER_SITE_OTHER): Payer: Medicare Other | Admitting: Physician Assistant

## 2015-01-16 VITALS — BP 98/54 | HR 75 | Ht 62.0 in | Wt 181.0 lb

## 2015-01-16 DIAGNOSIS — I447 Left bundle-branch block, unspecified: Secondary | ICD-10-CM | POA: Diagnosis not present

## 2015-01-16 DIAGNOSIS — R000 Tachycardia, unspecified: Secondary | ICD-10-CM | POA: Diagnosis not present

## 2015-01-16 DIAGNOSIS — R0602 Shortness of breath: Secondary | ICD-10-CM

## 2015-01-16 DIAGNOSIS — I4891 Unspecified atrial fibrillation: Secondary | ICD-10-CM

## 2015-01-16 MED ORDER — DIGOXIN 125 MCG PO TABS: 0.1250 mg | ORAL_TABLET | Freq: Every day | ORAL | Status: DC

## 2015-01-16 MED ORDER — APIXABAN 5 MG PO TABS: 5.0000 mg | ORAL_TABLET | Freq: Two times a day (BID) | ORAL | Status: DC

## 2015-01-16 MED ORDER — CARVEDILOL 12.5 MG PO TABS: 12.5000 mg | ORAL_TABLET | Freq: Two times a day (BID) | ORAL | Status: DC

## 2015-01-16 NOTE — Telephone Encounter (Signed)
Pt c/o of not feeling well checked her HR on [employer] monitor on her phone and it stated she was in A. Fib and HR was 160.  Pt called office spoke with on call PA was instructed to go to the emergency room if extra dose of Coreg didn't help.  Pt stated it eventually calmed down about sometime in the night and this morning HR was 70-80's and pt stated she still does not feel great.  Pt stated she is having some "slight dizziness" at times but not always.  Pt stated she feels worse most times after she eats but no c/o of CP just uncomfortable around her neck. Pt has occasional SOB with activity.

## 2015-01-16 NOTE — Progress Notes (Addendum)
Cardiology Office Note   Date:  01/16/2015   ID:  [name] 1946/06/20, MRN [id number]  PCP:  Odette Fraction, MD  Cardiologist: Dr Waynetta Pean, PA-C   Chief Complaint  Patient presents with  . Tachycardia    History of Present Illness: [name] is a 69 y.o. female with a history of transient LBBB/IVCD, breast cancer, GERD, type 2 DM, HTN, asthma, remote PE in 2001, HLD and NICM. EF previously was 35-40% at cath, by echo was in July 2015, EF was normal. Echo rechecked 01/02/2015 and her EF was 35-40 percent with ventricular dyssynergy but no regional wall motion abnormalities noted.  She was traveling at the end of February and came back the middle of March. She had approximately 1 hr episode of SOB on the trip out, during an 8 hour flight. Symptoms resolved and there was no recurrence on the trip back. She was on a cruise and due to poor weather, was not that active. Did not notice any SOB or chest fullness.  Since returning, she has noticed episodes of upper chest fullness. They are intermittent and have generally not lasted very long. The fatigue (described in 03/30 office visit) started before then and has been making her feel generally bad. Some DOE, no sig change since Lasix started 03/30.   Last pm, pt sitting still and had onset of upper chest fullness. She had more caffeine than usual yesterday, in the morning. She took usual evening medications about 7 pm.  She did the LiveCor monitor and her HR was as high as 160. She called and took an extra 1/2 Coreg as directed. Her HR finally settled down about an hour after she went to bed.   Today, she began feeling bad and had a small amount of fullness, but her HR was regular, SR by monitor. Currently she feels well. On her Livecor monitor, there was paroxysmal rapid atrial fibrillation which correlates with the time of her symptoms. The strips were reviewed by Dr. Meda Coffee who agreed.   Past Medical  History  Diagnosis Date  . Cancer     breast CA  . Asthma   . GERD (gastroesophageal reflux disease)   . DM2 (diabetes mellitus, type 2)   . Arthritis   . Allergy history unknown   . Hiatal hernia   . Leg cramps   . Hypertension   . Elevated cholesterol   . NICM (nonischemic cardiomyopathy)     a. Echo:  06/14/12: Poor endocardial definition, possible septal HK, EF 50-55%, normal wall motion, mild LAE ;  b.  [facility] 9/13:  normal cors, EF 35-40%,  c. Echo 7/14: EF 50-55%, mild LAE  . LBBB (left bundle branch block) 10/27/2012  . Breast cancer, left breast 10/27/2012    3.2 cm ER positive, Her-2 negative, 1 node positive S/P mastectomy/TTRAM reconstruction 4/08  . Breast cancer, right breast 10/27/2012    3.2 cm ER/PR positive, 1 node positive, Her-2 negative Rx w mastectomy, AC -T chemo, followed by aromatase inhibitor x 5 years dx 4/08  . Diabetes   . Other fatigue 01/01/2015    Past Surgical History  Procedure Laterality Date  . Mastectomy      bilateral  . Knee surgery      left  . Dilation and curettage of uterus    . Left heart catheterization with coronary angiogram N/A 06/15/2012    Procedure: LEFT HEART CATHETERIZATION WITH CORONARY ANGIOGRAM;  Surgeon: Wellington Hampshire,  MD;  Location: [facility]ATH [facility];  Service: Cardiovascular;  Laterality: N/A;    Current Outpatient Prescriptions  Medication Sig Dispense Refill  . aspirin EC 81 MG tablet Take 81 mg by mouth daily.      Marland Kitchen atorvastatin (LIPITOR) 20 MG tablet TAKE 2 TABLETS (40 MG TOTAL) BY MOUTH EVERY EVENING. 60 tablet 2  . Blood Glucose Monitoring Suppl (ONE TOUCH ULTRA 2) W/DEVICE KIT Check FBS qam - Dx:250.00 and she will need lancets (1 box) , strips (1 bottle = 100) and controls also thanks 1 each 0  . Calcium Carbonate-Vitamin D (CALCIUM-VITAMIN D) 500-200 MG-UNIT per tablet Take 1 tablet by mouth 2 (two) times daily with a meal.     . carvedilol (COREG) 12.5 MG tablet Take 1 tablet (12.5 mg total) by mouth 2 (two) times  daily. 180 tablet 3  . cetirizine (ZYRTEC) 10 MG tablet Take 10 mg by mouth every evening.    Marland Kitchen CINNAMON PO Take 1 tablet by mouth daily.      . Coenzyme Q10 (CO Q 10 PO) Take 1 capsule by mouth daily.      . cyanocobalamin 500 MCG tablet Take 500 mcg by mouth daily. 10/25/12--Pt not sure of dose    . DEXILANT 60 MG capsule TAKE 1 CAPSULE (60 MG TOTAL) BY MOUTH DAILY. 90 capsule 3  . diclofenac (VOLTAREN) 75 MG EC tablet Take 1 tablet (75 mg total) by mouth 2 (two) times daily. 60 tablet 0  . fluticasone (FLONASE) 50 MCG/ACT nasal spray INHALE 2 SPRAYS IN EACH NOSTRIL DAILY FOR 3 MONTHS 48 g 1  . furosemide (LASIX) 20 MG tablet Take 1 tablet (20 mg total) by mouth daily. 30 tablet 6  . Glucosamine-Chondroitin (GLUCOSAMINE CHONDR COMPLEX PO) Take 1 tablet by mouth daily.      . Multiple Vitamins-Minerals (MULTIVITAMINS THER. W/MINERALS) TABS Take 1 tablet by mouth daily.      . nitroGLYCERIN (NITROSTAT) 0.4 MG SL tablet Place 1 tablet (0.4 mg total) under the tongue every 5 (five) minutes as needed for chest pain. 25 tablet prn  . OVER THE COUNTER MEDICATION Take 1 tablet by mouth daily. Leg cramps    . Probiotic Product (RESTORA) CAPS 1 cap po qd 30 capsule 11  . valsartan (DIOVAN) 160 MG tablet TAKE 1 AND 1/2 TABLETS (TOTAL 240MG) DAILY 135 tablet 3  . vitamin C (ASCORBIC ACID) 500 MG tablet Take 500 mg by mouth 2 (two) times daily.      . WELCHOL 625 MG tablet CMD FOR 90D TAKE 3 TABLETS (1,875 MG TOTAL) BY MOUTH 2 (TWO) TIMES DAILY WITH A MEAL. 270 tablet 3  . zafirlukast (ACCOLATE) 20 MG tablet TAKE 1 TABLET (20 MG TOTAL) BY MOUTH 2 (TWO) TIMES DAILY. 180 tablet 1  . SYMBICORT 160-4.5 MCG/ACT inhaler Inhale 2 puffs into the lungs as needed. For wheezing  3   No current facility-administered medications for this visit.    Allergies:   Adhesive; Diphenhydramine; and Vicodin    Social History:  The patient  reports that she has never smoked. She has never used smokeless tobacco. She reports  that she does not drink alcohol or use illicit drugs.   Family History:  The patient's family history includes Asthma in her maternal grandmother; Cancer in her maternal grandmother; Heart attack in her paternal grandfather.    ROS:  Please see the history of present illness. All other systems are reviewed and negative.    PHYSICAL EXAM: VS:  BP  98/54 mmHg  Pulse 75  Ht 5' 2"  (1.575 m)  Wt 181 lb (82.101 kg)  BMI 33.10 kg/m2 , BMI Body mass index is 33.1 kg/(m^2). GEN: Well nourished, well developed, in no acute distress HEENT: normal Neck: no JVD, carotid bruits, or masses Cardiac: RRR; no murmurs, rubs, or gallops, no edema  Respiratory:  clear to auscultation bilaterally, normal work of breathing GI: soft, nontender, nondistended, + BS MS: no deformity or atrophy Skin: warm and dry, no rash Neuro:  Strength and sensation are intact Psych: euthymic mood, full affect   EKG:  EKG is ordered today. The ekg ordered today demonstrates SR, LBBB is old   Recent Labs: 01/01/2015: ALT 22; Hemoglobin 13.8; Platelets 264; TSH 1.380 01/02/2015: Pro B Natriuretic peptide (BNP) 46.0 01/09/2015: BUN 16; Creatinine 0.78; Potassium 4.1; Sodium 139    Lipid Panel    Component Value Date/Time   CHOL 115 07/10/2014 1054   TRIG 137 07/10/2014 1054   HDL 43 07/10/2014 1054   CHOLHDL 2.7 07/10/2014 1054   VLDL 27 07/10/2014 1054   LDLCALC 45 07/10/2014 1054     Wt Readings from Last 3 Encounters:  01/16/15 181 lb (82.101 kg)  01/02/15 179 lb 12.8 oz (81.557 kg)  01/01/15 180 lb 8 oz (81.874 kg)     Other studies Reviewed: Additional studies/ records that were reviewed today include: Previous office visits, pt telemetry reports.  ASSESSMENT AND PLAN:  1. Atrial fibrillation, PAF, RVR:  Seen on the strips from her home monitoring system, but these correlate with the times of her symptoms, and her current readings reflect her current heart rate and rhythm.   Therefore, we will add  dig, prn Coreg, no change in baseline BB. Recent TSH was normal. Ck PT/INR, CBC. Do Holter monitor to see if burden of afib is higher than thought.  2. Cardiomyopathy: continue ARB, but will decrease dose to allow for increased BB for HR control  3. Anticoagulation: This patients CHA2DS2-VASc Score and unadjusted Ischemic Stroke Rate (% per year) is equal to 3.2 % stroke rate/year from a score of 3 Above score calculated as 1 point each if present [CHF, HTN, DM, Vascular=MI/PAD/Aortic Plaque, Age if 65-74, or Female] Above score calculated as 2 points each if present [Age > 75, or Stroke/TIA/TE] She agrees to anticoagulation, will add Eliquis   Current medicines are reviewed at length with the patient today.  The patient does not have concerns regarding medicines.  The following changes have been made:  Add digoxin, prn plus scheduled Coreg, Holter, f/u w/ MD   Orders Placed This Encounter  Procedures  . EKG 12-Lead     Disposition:   FU with Dr Aundra Dubin in 3 months   Signed, Rosaria Ferries, PA-C  01/16/2015 3:57 PM    [facility], [place], [place]  [address] Phone: [phone]; Fax: [phone]

## 2015-01-16 NOTE — Telephone Encounter (Signed)
New message      Pt states her HR was 82 at 10am and she does not feel good.  Pt called the oncall doctor and he told her to take 1/2 coreg.  After a while it got better. Today, she does not feel good.  Please advise

## 2015-01-16 NOTE — Patient Instructions (Addendum)
Medication Instructions:  Your physician has recommended you make the following change in your medication:   1- START Eliquis 5 mg by mouth twice daily  2- START Digoxin 0.125 mg by mouth daily, take two tablets for first dose.  3-STOP Aspirin   4-KEEP taking coreg 12.5 mg by mouth daily, also as needed if heart rate elevated above 120 sustained take 1/2 a tablet up to twice daily  Labwork:  Your physician recommends that you have labs today CBC and PT/INR  Testing/Procedures:  Your physician has recommended that you wear a 48 hour holter monitor. Holter monitors are medical devices that record the heart's electrical activity. Doctors most often use these monitors to diagnose arrhythmias. Arrhythmias are problems with the speed or rhythm of the heartbeat. The monitor is a small, portable device. You can wear one while you do your normal daily activities. This is usually used to diagnose what is causing palpitations/syncope (passing out).   Follow-Up:  Your physician recommends that you schedule a follow-up appointment as needed.   Any Other Special Instructions Will Be Listed Below (If Applicable).  Holter Monitoring A Holter monitor is a small device with electrodes (small sticky patches) that attach to your chest. It records the electrical activity of your heart and is worn continuously for 24-48 hours.  A HOLTER MONITOR IS USED TO  Detect heart problems such as:  Heart arrhythmia. Is an abnormal or irregular heartbeat. With some heart arrhythmias, you may not feel or know that you have an irregular heart rhythm.  Palpitations, such as feeling your heart racing or fluttering. It is possible to have heart palpitations and not have a heart arrhythmia.  A heart rhythm that is too slow or too fast.  If you have problems fainting, near fainting or feeling light-headed, a Holter monitor may be worn to see if your heart is the cause. HOLTER MONITOR PREPARATION   Electrodes will  be attached to the skin on your chest.  If you have hair on your chest, small areas may have to be shaved. This is done to help the patches stick better and make the recording more accurate.  The electrodes are attached by wires to the Holter monitor. The Holter monitor clips to your clothing. You will wear the monitor at all times, even while exercising and sleeping. HOME CARE INSTRUCTIONS   Wear your monitor at all times.  The wires and the monitor must stay dry. Do not get the monitor wet.  Do not bathe, swim or use a hot tub with it on.  You may do a "sponge" bath while you have the monitor on.  Keep your skin clean, do not put body lotion or moisturizer on your chest.  It's possible that your skin under the electrodes could become irritated. To keep this from happening, you may put the electrodes in slightly different places on your chest.  Your caregiver will also ask you to keep a diary of your activities, such as walking or doing chores. Be sure to note what you are doing if you experience heart symptoms such as palpitations. This will help your caregiver determine what might be contributing to your symptoms. The information stored in your monitor will be reviewed by your caregiver alongside your diary entries.  Make sure the monitor is safely clipped to your clothing or in a location close to your body that your caregiver recommends.  The monitor and electrodes are removed when the test is over. Return the monitor as directed.  Be sure to follow up with your caregiver and discuss your Holter monitor results. SEEK IMMEDIATE MEDICAL CARE IF:  You faint or feel lightheaded.  You have trouble breathing.  You get pain in your chest, upper arm or jaw.  You feel sick to your stomach and your skin is pale, cool, or damp.  You think something is wrong with the way your heart is beating. MAKE SURE YOU:   Understand these instructions.  Will watch your condition.  Will get  help right away if you are not doing well or get worse. Document Released: 06/19/2004 Document Revised: 12/14/2011 Document Reviewed: 11/01/2008 [facility] Patient Information 2015 [place], [place]. This information is not intended to replace advice given to you by your health care provider. Make sure you discuss any questions you have with your health care provider.

## 2015-01-17 LAB — CBC WITH DIFFERENTIAL/PLATELET
BASOS PCT: 0.5 % (ref 0.0–3.0)
Basophils Absolute: 0 10*3/uL (ref 0.0–0.1)
EOS PCT: 1.7 % (ref 0.0–5.0)
Eosinophils Absolute: 0.1 10*3/uL (ref 0.0–0.7)
HCT: 39 % (ref 36.0–46.0)
Hemoglobin: 13.2 g/dL (ref 12.0–15.0)
LYMPHS PCT: 37 % (ref 12.0–46.0)
Lymphs Abs: 2.5 10*3/uL (ref 0.7–4.0)
MCHC: 34 g/dL (ref 30.0–36.0)
MCV: 94 fl (ref 78.0–100.0)
MONOS PCT: 8.2 % (ref 3.0–12.0)
Monocytes Absolute: 0.5 10*3/uL (ref 0.1–1.0)
NEUTROS ABS: 3.5 10*3/uL (ref 1.4–7.7)
Neutrophils Relative %: 52.6 % (ref 43.0–77.0)
Platelets: 205 10*3/uL (ref 150.0–400.0)
RBC: 4.15 Mil/uL (ref 3.87–5.11)
RDW: 12.6 % (ref 11.5–15.5)
WBC: 6.7 10*3/uL (ref 4.0–10.5)

## 2015-01-17 LAB — PROTIME-INR
INR: 1 ratio (ref 0.8–1.0)
Prothrombin Time: 10.8 s (ref 9.6–13.1)

## 2015-01-18 ENCOUNTER — Telehealth: Payer: Self-pay

## 2015-01-18 NOTE — Telephone Encounter (Signed)
Patient called back. Patient states she is feeling better, but she feels just a little off. Informed patient that Rosaria Ferries PA would be notified of changes. Patient stated she thinks she is fine. Will forward to [facility] PA so she knows. Encouraged patient to call office with any problems, questions, or concerns. Patient verbalized understanding.

## 2015-01-18 NOTE — Telephone Encounter (Signed)
Left message to call back to check on patient. Patient had some new medication orders and making sure she is not having any problems or questions after taking new medication for a day.

## 2015-01-21 ENCOUNTER — Encounter (HOSPITAL_COMMUNITY): Payer: Self-pay

## 2015-01-21 ENCOUNTER — Encounter: Payer: Self-pay | Admitting: *Deleted

## 2015-01-21 ENCOUNTER — Encounter (INDEPENDENT_AMBULATORY_CARE_PROVIDER_SITE_OTHER): Payer: Medicare Other

## 2015-01-21 ENCOUNTER — Ambulatory Visit (HOSPITAL_COMMUNITY)
Admission: RE | Admit: 2015-01-21 | Discharge: 2015-01-21 | Disposition: A | Discharge status: Home / Self Care | Payer: Medicare Other | Source: Ambulatory Visit | Attending: Cardiology | Admitting: Cardiology

## 2015-01-21 VITALS — BP 118/68 | HR 79 | Wt 183.0 lb

## 2015-01-21 DIAGNOSIS — I10 Essential (primary) hypertension: Secondary | ICD-10-CM | POA: Insufficient documentation

## 2015-01-21 DIAGNOSIS — I447 Left bundle-branch block, unspecified: Secondary | ICD-10-CM

## 2015-01-21 DIAGNOSIS — Z7901 Long term (current) use of anticoagulants: Secondary | ICD-10-CM | POA: Insufficient documentation

## 2015-01-21 DIAGNOSIS — I480 Paroxysmal atrial fibrillation: Secondary | ICD-10-CM | POA: Diagnosis not present

## 2015-01-21 DIAGNOSIS — E119 Type 2 diabetes mellitus without complications: Secondary | ICD-10-CM | POA: Diagnosis not present

## 2015-01-21 DIAGNOSIS — E785 Hyperlipidemia, unspecified: Secondary | ICD-10-CM | POA: Insufficient documentation

## 2015-01-21 DIAGNOSIS — J45909 Unspecified asthma, uncomplicated: Secondary | ICD-10-CM | POA: Diagnosis not present

## 2015-01-21 DIAGNOSIS — R079 Chest pain, unspecified: Secondary | ICD-10-CM | POA: Diagnosis not present

## 2015-01-21 DIAGNOSIS — I429 Cardiomyopathy, unspecified: Secondary | ICD-10-CM | POA: Diagnosis not present

## 2015-01-21 DIAGNOSIS — Z79899 Other long term (current) drug therapy: Secondary | ICD-10-CM | POA: Diagnosis not present

## 2015-01-21 DIAGNOSIS — R0789 Other chest pain: Secondary | ICD-10-CM

## 2015-01-21 DIAGNOSIS — I428 Other cardiomyopathies: Secondary | ICD-10-CM

## 2015-01-21 DIAGNOSIS — R000 Tachycardia, unspecified: Secondary | ICD-10-CM | POA: Diagnosis not present

## 2015-01-21 DIAGNOSIS — I4891 Unspecified atrial fibrillation: Secondary | ICD-10-CM

## 2015-01-21 DIAGNOSIS — K219 Gastro-esophageal reflux disease without esophagitis: Secondary | ICD-10-CM | POA: Insufficient documentation

## 2015-01-21 MED ORDER — CARVEDILOL 12.5 MG PO TABS: 12.5000 mg | ORAL_TABLET | Freq: Two times a day (BID) | ORAL | Status: DC

## 2015-01-21 MED ORDER — SPIRONOLACTONE 25 MG PO TABS: 12.5000 mg | ORAL_TABLET | Freq: Every day | ORAL | Status: DC

## 2015-01-21 NOTE — Patient Instructions (Signed)
Start Spironolactone 12.5 mg (1/2 tab) daily  Labs in 10 days (bmet, cbc)  Your physician recommends that you schedule a follow-up appointment in: 2 weeks after your CPX test

## 2015-01-21 NOTE — Progress Notes (Signed)
Patient ID: [name], female   DOB: 1946-05-18, 69 y.o.   MRN: [id number] EC 48 hour Holter Monitor applied

## 2015-01-21 NOTE — Progress Notes (Signed)
Patient ID: [name], female   DOB: 10-Apr-1946, 69 y.o.   MRN: [id number] PCP: Dr. Dennard Schaumann  69 yo with history of HTN and type II diabetes presents for evaluation of nonischemic cardiomyopathy.  Back in 2009, she had an echo with normal EF.  She was admitted in 9/13 with chest pain and LBBB.  She had LHC showing no CAD but EF 35-40%.  Echo was a technically difficult study with EF reported as 50-55%.  She has been taking ARB and Coreg.  Repeat echo in 12/13 showed EF 40% with septal hypokinesis.  Cardiac MRI was done in 12/13 as well, with calculated EF 50%, global hypokinesis, and no delayed enhancement.  Echo in 7/15 showed EF 55-60% with mild MR and mild TR (no LBBB at this time).   In 3/16, she began to develop increased dyspnea.  She was seen by Truitt Merle and was noted to have a LBBB again. Echo in 3/16 showed EF down to 35-40%.  She was then seen in followup by Rosaria Ferries.  Alivecor on her phone showed a period of HR up to 160s that appeared to be atrial fibrillation.  She felt tachypalpitations. Coreg was increased and she was started on apixaban 5 mg bid.   She remains more fatigued than her baseline. She has not felt any further significant tachypalpitations.  She has been on apixaban without melena or BRBPR.  She is short of breath walking up stairs or with heavier exertion.  She was able to garden yesterday without much problem.  No chest pain.  She remains in a LBBB.   ECG: NSR, LBBB  Labs (9/13): TSH 4.884 (elevated), proBNP 155 Labs (10/13): K 3.6, creatinine 0.8 Labs (1/14): K 3.7, creatinine 0.8, BNP not elevated, ANA weakly positive, TSH normal Labs (7/14): BNP normal Labs (11/14): K 4.1, creatinine 0.73 Labs (10/15): K 4.2, creatinine 0.73, LDL 45 Labs (3/16): BNP 46, HCT 42.2, TSH normal Labs (4/16): K 4.1, creatinine 0.78  PMH: 1. LBBB/IVCD: Now persistent.   2. Breast cancer s/p bilateral mastectomy. 3. GERD 4. Type II diabetes mellitus 5. HTN 6. Asthma 7.  PE in 2001 8. Hyperlipidemia 9. Nonischemic Cardiomyopathy: Echo 8/09 with 60%.  Admitted in 9/13 with chest pain.  LHC showed no angiographic CAD and EF 35-40%.  Echo at that time was read as showing EF 50-55% but was a technically difficult study.  Echo (12/13): EF 40% with septal hypokinesis, normal RV size and systolic function, no significant valvular abnormalities.  Cardiac MRI (12/13): EF 50%, global hypokinesis, no delayed enhancement.  Echo (7/14) with EF 50-55%, mild LV dilation, mild LAE.  Echo (7/15) with EF 55-60%, mild MR and mild TR.  Echo (3/16) with EF 35-40%, septal-lateral dyssynchrony.   SH: Married, lives in [facility], never smoked.  FH: Uncle with ? SCD.  Brother with ? SCD.    Current Outpatient Prescriptions  Medication Sig Dispense Refill  . apixaban (ELIQUIS) 5 MG TABS tablet Take 1 tablet (5 mg total) by mouth 2 (two) times daily. 90 tablet 3  . atorvastatin (LIPITOR) 20 MG tablet TAKE 2 TABLETS (40 MG TOTAL) BY MOUTH EVERY EVENING. 60 tablet 2  . Blood Glucose Monitoring Suppl (ONE TOUCH ULTRA 2) W/DEVICE KIT Check FBS qam - Dx:250.00 and she will need lancets (1 box) , strips (1 bottle = 100) and controls also thanks 1 each 0  . Calcium Carbonate-Vitamin D (CALCIUM-VITAMIN D) 500-200 MG-UNIT per tablet Take 1 tablet by mouth 2 (  two) times daily with a meal.     . carvedilol (COREG) 12.5 MG tablet Take 1 tablet (12.5 mg total) by mouth 2 (two) times daily. Take an extra 1/2 tablet for sustained elevated heart rate over 120, up to twice daily. 210 tablet 3  . cetirizine (ZYRTEC) 10 MG tablet Take 10 mg by mouth every evening.    Marland Kitchen CINNAMON PO Take 1 tablet by mouth daily.      . Coenzyme Q10 (CO Q 10 PO) Take 1 capsule by mouth daily.      . cyanocobalamin 500 MCG tablet Take 500 mcg by mouth daily. 10/25/12--Pt not sure of dose    . DEXILANT 60 MG capsule TAKE 1 CAPSULE (60 MG TOTAL) BY MOUTH DAILY. 90 capsule 3  . digoxin (LANOXIN) 0.125 MG tablet Take 1  tablet (0.125 mg total) by mouth daily. Take two tablets for first dose. 90 tablet 3  . fluticasone (FLONASE) 50 MCG/ACT nasal spray INHALE 2 SPRAYS IN EACH NOSTRIL DAILY FOR 3 MONTHS 48 g 1  . furosemide (LASIX) 20 MG tablet Take 1 tablet (20 mg total) by mouth daily. 30 tablet 6  . Glucosamine-Chondroitin (GLUCOSAMINE CHONDR COMPLEX PO) Take 1 tablet by mouth daily.      . Multiple Vitamins-Minerals (MULTIVITAMINS THER. W/MINERALS) TABS Take 1 tablet by mouth daily.      Marland Kitchen OVER THE COUNTER MEDICATION Take 1 tablet by mouth daily. Leg cramps    . Probiotic Product (RESTORA) CAPS 1 cap po qd 30 capsule 11  . SYMBICORT 160-4.5 MCG/ACT inhaler Inhale 2 puffs into the lungs as needed. For wheezing  3  . valsartan (DIOVAN) 160 MG tablet TAKE 1 AND 1/2 TABLETS (TOTAL 240MG) DAILY 135 tablet 3  . vitamin C (ASCORBIC ACID) 500 MG tablet Take 500 mg by mouth 2 (two) times daily.      . WELCHOL 625 MG tablet CMD FOR 90D TAKE 3 TABLETS (1,875 MG TOTAL) BY MOUTH 2 (TWO) TIMES DAILY WITH A MEAL. 270 tablet 3  . zafirlukast (ACCOLATE) 20 MG tablet TAKE 1 TABLET (20 MG TOTAL) BY MOUTH 2 (TWO) TIMES DAILY. 180 tablet 1  . diclofenac (VOLTAREN) 75 MG EC tablet Take 1 tablet (75 mg total) by mouth 2 (two) times daily. (Patient not taking: Reported on 01/21/2015) 60 tablet 0  . nitroGLYCERIN (NITROSTAT) 0.4 MG SL tablet Place 1 tablet (0.4 mg total) under the tongue every 5 (five) minutes as needed for chest pain. (Patient not taking: Reported on 01/21/2015) 25 tablet prn  . spironolactone (ALDACTONE) 25 MG tablet Take 0.5 tablets (12.5 mg total) by mouth daily. 15 tablet 3   No current facility-administered medications for this encounter.    BP 118/68 mmHg  Pulse 79  Wt 183 lb (83.008 kg)  SpO2 98% General: NAD Neck: No JVD, no thyromegaly or thyroid nodule.  Lungs: Clear to auscultation bilaterally with normal respiratory effort. CV: Nondisplaced PMI.  Heart regular S1/S2 with paradoxical S2 split, no S3/S4,  no murmur.  No peripheral edema.  No carotid bruit.  Normal pedal pulses.  Abdomen: Soft, nontender, no hepatosplenomegaly, no distention.  Neurologic: Alert and oriented x 3.  Psych: Normal affect. Extremities: No clubbing or cyanosis.   Assessment/Plan: 1. Nonischemic cardiomyopathy: NYHA class II-III symptoms (dyspnea with relatively heavy exertion), not volume overloaded on exam.  No CAD on cath 9/13 but EF 35-40% at that time.  ECG showed LBBB.  LBBB resolved and EF normalized.  However, more recently, patient has had  some fatigue and dyspnea => repeat echo with EF back down to 35-40%.  No chest pain.  This may be a LBBB cardiomyopathy, doubt CAD.   - Continue Coreg and valsartan at current doses.  - Can continue digoxin. - I will add spironolactone 12.5 mg daily, BMET 10 days.  - She does not looks volume overloaded on exam, continue current Lasix.  - Symptomatic despite minimal volume overload on exam => arrange for CPX.   2. Hyperlipidemia: Continue statin given risk factors (diabetes, HTN).  Good lipids in 10/15.  3. Atrial fibrillation: Paroxysmal.  Noted on Alivecor from phone.  No recent tachypalpitations.  She is set up for holter monitoring to try to get an idea of atrial fibrillation burden.  Continue Eliquis 5 mg bid. CBC with labs in 10 days.  4. LBBB: This was transient in the past, now seems persistent.  LBBB itself can contribute to cardiomyopathy.   Followup in 2 wks.   Loralie Champagne 01/21/2015

## 2015-01-24 ENCOUNTER — Ambulatory Visit (HOSPITAL_COMMUNITY): Payer: Medicare Other | Attending: Cardiology

## 2015-01-24 DIAGNOSIS — R0600 Dyspnea, unspecified: Secondary | ICD-10-CM | POA: Insufficient documentation

## 2015-01-24 DIAGNOSIS — I428 Other cardiomyopathies: Secondary | ICD-10-CM

## 2015-01-24 DIAGNOSIS — E785 Hyperlipidemia, unspecified: Secondary | ICD-10-CM

## 2015-01-24 DIAGNOSIS — R0602 Shortness of breath: Secondary | ICD-10-CM

## 2015-01-30 ENCOUNTER — Other Ambulatory Visit: Payer: Self-pay | Admitting: Family Medicine

## 2015-01-31 ENCOUNTER — Ambulatory Visit (INDEPENDENT_AMBULATORY_CARE_PROVIDER_SITE_OTHER): Payer: Medicare Other | Admitting: Family Medicine

## 2015-01-31 ENCOUNTER — Encounter: Payer: Self-pay | Admitting: Family Medicine

## 2015-01-31 VITALS — BP 110/70 | HR 78 | Temp 98.2°F | Resp 16 | Ht 62.0 in | Wt 180.0 lb

## 2015-01-31 DIAGNOSIS — R221 Localized swelling, mass and lump, neck: Secondary | ICD-10-CM | POA: Diagnosis not present

## 2015-01-31 DIAGNOSIS — R220 Localized swelling, mass and lump, head: Secondary | ICD-10-CM

## 2015-02-01 ENCOUNTER — Other Ambulatory Visit (INDEPENDENT_AMBULATORY_CARE_PROVIDER_SITE_OTHER): Payer: Medicare Other | Admitting: *Deleted

## 2015-02-01 ENCOUNTER — Encounter: Payer: Self-pay | Admitting: Family Medicine

## 2015-02-01 DIAGNOSIS — R0789 Other chest pain: Secondary | ICD-10-CM | POA: Diagnosis not present

## 2015-02-01 LAB — CBC
HCT: 39.3 % (ref 36.0–46.0)
Hemoglobin: 13.4 g/dL (ref 12.0–15.0)
MCHC: 34.2 g/dL (ref 30.0–36.0)
MCV: 92.9 fl (ref 78.0–100.0)
Platelets: 269 10*3/uL (ref 150.0–400.0)
RBC: 4.23 Mil/uL (ref 3.87–5.11)
RDW: 13.1 % (ref 11.5–15.5)
WBC: 5.7 10*3/uL (ref 4.0–10.5)

## 2015-02-01 LAB — BASIC METABOLIC PANEL
BUN: 15 mg/dL (ref 6–23)
CALCIUM: 9.1 mg/dL (ref 8.4–10.5)
CO2: 27 mEq/L (ref 19–32)
CREATININE: 0.8 mg/dL (ref 0.40–1.20)
Chloride: 105 mEq/L (ref 96–112)
GFR: 75.2 mL/min (ref >60.00)
Glucose, Bld: 148 mg/dL — ABNORMAL HIGH (ref 70–99)
POTASSIUM: 4.1 meq/L (ref 3.5–5.1)
Sodium: 137 mEq/L (ref 135–145)

## 2015-02-01 NOTE — Progress Notes (Signed)
Subjective:    Patient ID: [name], female    DOB: 06/23/1946, 69 y.o.   MRN: [id number]  HPI Patient has history of stage II breast cancer. She also has a history of a small enlarged left supraclavicular lymph node that has been there for several years. I have copied relevant portions of her office visit with her oncologist, Dr. Waymon Budge, discussing this:  We have been following a small, left, supraclavicular lymph node now for a few years. It has been biopsied twice on 12/08/2007 and again on 08/17/2011 with no malignant diagnosis obtained. She has had followup CT scans of her neck in the area remains unchanged. Most recent CT neck done one month prior to most recent biopsy on 07/16/2011. Most recent mammogram done 05/18/2012 on the left breast showed no new disease. She had previous right mastectomy.  Patient presents today complaining that the area is now painful. She states that it hurts in the exact area of the lymph node whenever she turns her head. She believes the mass is growing. On examination I am unable to palpate any definite mass although there is a fullness in the left supraclavicular space consistent with lymphedema in that area versus possibly a lipoma. Patient winces in pain every time I palpate in the area. She is adamant that she wants this area treated and she wants to see a surgeon to have the lymph node removed.  Unfortunately, in the interval time, patient has developed stage III NYHA heart failure due to non-ischemic cardiomyopathy. She is on maximum medical therapy for this including an ARB, beta blocker, spironolactone,  Lasix for edema, and digoxin for dyspnea on exertion.  Despite this, she continues to complain of dyspnea with minimal exertion. She is also developed paroxysmal atrial fibrillation and is currently on Eliquis for prevention of stroke. I spent over 20 minutes explaining these diagnoses to the patient and the risk of elective surgery. Past Medical  History  Diagnosis Date  . Cancer     breast CA  . Asthma   . GERD (gastroesophageal reflux disease)   . DM2 (diabetes mellitus, type 2)   . Arthritis   . Allergy history unknown   . Hiatal hernia   . Leg cramps   . Hypertension   . Elevated cholesterol   . NICM (nonischemic cardiomyopathy)     a. Echo:  06/14/12: Poor endocardial definition, possible septal HK, EF 50-55%, normal wall motion, mild LAE ;  b.  [facility] 9/13:  normal cors, EF 35-40%,  c. Echo 7/14: EF 50-55%, mild LAE  . LBBB (left bundle branch block) 10/27/2012  . Breast cancer, left breast 10/27/2012    3.2 cm ER positive, Her-2 negative, 1 node positive S/P mastectomy/TTRAM reconstruction 4/08  . Breast cancer, right breast 10/27/2012    3.2 cm ER/PR positive, 1 node positive, Her-2 negative Rx w mastectomy, AC -T chemo, followed by aromatase inhibitor x 5 years dx 4/08  . Diabetes   . Other fatigue 01/01/2015   Past Surgical History  Procedure Laterality Date  . Mastectomy      bilateral  . Knee surgery      left  . Dilation and curettage of uterus    . Left heart catheterization with coronary angiogram N/A 06/15/2012    Procedure: LEFT HEART CATHETERIZATION WITH CORONARY ANGIOGRAM;  Surgeon: Wellington Hampshire, MD;  Location: [facility] CATH [facility];  Service: Cardiovascular;  Laterality: N/A;   Current Outpatient Prescriptions on File Prior to Visit  Medication  Sig Dispense Refill  . apixaban (ELIQUIS) 5 MG TABS tablet Take 1 tablet (5 mg total) by mouth 2 (two) times daily. 90 tablet 3  . atorvastatin (LIPITOR) 20 MG tablet TAKE 2 TABLETS (40 MG TOTAL) BY MOUTH EVERY EVENING. 60 tablet 2  . atorvastatin (LIPITOR) 20 MG tablet TAKE 2 TABLETS (40 MG TOTAL) BY MOUTH EVERY EVENING. 60 tablet 2  . Blood Glucose Monitoring Suppl (ONE TOUCH ULTRA 2) W/DEVICE KIT Check FBS qam - Dx:250.00 and she will need lancets (1 box) , strips (1 bottle = 100) and controls also thanks 1 each 0  . Calcium Carbonate-Vitamin D (CALCIUM-VITAMIN D) 500-200  MG-UNIT per tablet Take 1 tablet by mouth 2 (two) times daily with a meal.     . carvedilol (COREG) 12.5 MG tablet Take 1 tablet (12.5 mg total) by mouth 2 (two) times daily. Take an extra 1/2 tablet for sustained elevated heart rate over 120, up to twice daily. 210 tablet 3  . cetirizine (ZYRTEC) 10 MG tablet Take 10 mg by mouth every evening.    Marland Kitchen CINNAMON PO Take 1 tablet by mouth daily.      . Coenzyme Q10 (CO Q 10 PO) Take 1 capsule by mouth daily.      . cyanocobalamin 500 MCG tablet Take 500 mcg by mouth daily. 10/25/12--Pt not sure of dose    . DEXILANT 60 MG capsule TAKE 1 CAPSULE (60 MG TOTAL) BY MOUTH DAILY. 90 capsule 3  . DEXILANT 60 MG capsule TAKE 1 CAPSULE (60 MG TOTAL) BY MOUTH DAILY. 90 capsule 3  . diclofenac (VOLTAREN) 75 MG EC tablet Take 1 tablet (75 mg total) by mouth 2 (two) times daily. 60 tablet 0  . digoxin (LANOXIN) 0.125 MG tablet Take 1 tablet (0.125 mg total) by mouth daily. Take two tablets for first dose. 90 tablet 3  . fluticasone (FLONASE) 50 MCG/ACT nasal spray INHALE 2 SPRAYS IN EACH NOSTRIL DAILY FOR 3 MONTHS 48 g 1  . furosemide (LASIX) 20 MG tablet Take 1 tablet (20 mg total) by mouth daily. 30 tablet 6  . Glucosamine-Chondroitin (GLUCOSAMINE CHONDR COMPLEX PO) Take 1 tablet by mouth daily.      . Multiple Vitamins-Minerals (MULTIVITAMINS THER. W/MINERALS) TABS Take 1 tablet by mouth daily.      . nitroGLYCERIN (NITROSTAT) 0.4 MG SL tablet Place 1 tablet (0.4 mg total) under the tongue every 5 (five) minutes as needed for chest pain. 25 tablet prn  . OVER THE COUNTER MEDICATION Take 1 tablet by mouth daily. Leg cramps    . Probiotic Product (RESTORA) CAPS 1 cap po qd 30 capsule 11  . spironolactone (ALDACTONE) 25 MG tablet Take 0.5 tablets (12.5 mg total) by mouth daily. 15 tablet 3  . SYMBICORT 160-4.5 MCG/ACT inhaler Inhale 2 puffs into the lungs as needed. For wheezing  3  . valsartan (DIOVAN) 160 MG tablet TAKE 1 AND 1/2 TABLETS (TOTAL 240MG) DAILY 135  tablet 3  . vitamin C (ASCORBIC ACID) 500 MG tablet Take 500 mg by mouth 2 (two) times daily.      . WELCHOL 625 MG tablet CMD FOR 90D TAKE 3 TABLETS (1,875 MG TOTAL) BY MOUTH 2 (TWO) TIMES DAILY WITH A MEAL. 270 tablet 3  . zafirlukast (ACCOLATE) 20 MG tablet TAKE 1 TABLET (20 MG TOTAL) BY MOUTH 2 (TWO) TIMES DAILY. 180 tablet 1   No current facility-administered medications on file prior to visit.   Allergies  Allergen Reactions  . Adhesive [Tape]  Other (See Comments)    Skin turns red   . Diphenhydramine Other (See Comments)    Pt feels wired   . Vicodin [Hydrocodone-Acetaminophen] Nausea And Vomiting   History   Social History  . Marital Status: Married    Spouse Name: N/A  . Number of Children: 2  . Years of Education: N/A   Occupational History  . retired     post Furniture conservator/restorer   Social History Main Topics  . Smoking status: Never Smoker   . Smokeless tobacco: Never Used  . Alcohol Use: No  . Drug Use: No  . Sexual Activity: No   Other Topics Concern  . Not on file   Social History Narrative      Review of Systems  All other systems reviewed and are negative.      Objective:   Physical Exam  Constitutional: She appears well-developed and well-nourished. No distress.  Cardiovascular: Normal rate, regular rhythm and normal heart sounds.   Pulmonary/Chest: Effort normal and breath sounds normal. No respiratory distress. She has no wheezes. She has no rales. She exhibits no tenderness.  Abdominal: Soft. Bowel sounds are normal.  Musculoskeletal: She exhibits no edema.  Skin: She is not diaphoretic.  Vitals reviewed.  fullness in the left supraclavicular space consistent with lymphedema.         Assessment & Plan:  Head or neck swelling, mass, or lump - Plan: CT Soft Tissue Neck W Contrast   patient is adamant that she wants this area evaluated. She is concerned due to the pain. However I believe she is worried it may be cancerous and that  this may be the real driving factor. Again I spent over 20 minutes discussing the risk of elective surgery given her nonischemic cardiomyopathy and her paroxysmal atrial fibrillation. I will obtain a CT scan of the neck to evaluate further. However if there is no definite mass, I would try to dissuade the patient from exploratory surgery in the supraclavicular area.

## 2015-02-01 NOTE — Addendum Note (Signed)
Encounter addended by: Scarlette Calico, RN on: 02/01/2015 10:59 AM<BR>     Documentation filed: Visit Diagnoses, Dx Association, Orders

## 2015-02-04 ENCOUNTER — Other Ambulatory Visit: Payer: Self-pay | Admitting: Family Medicine

## 2015-02-04 ENCOUNTER — Other Ambulatory Visit: Payer: Medicare Other

## 2015-02-08 ENCOUNTER — Ambulatory Visit
Admission: RE | Admit: 2015-02-08 | Discharge: 2015-02-08 | Disposition: A | Discharge status: Home / Self Care | Payer: Medicare Other | Source: Ambulatory Visit | Attending: Family Medicine | Admitting: Family Medicine

## 2015-02-08 DIAGNOSIS — R220 Localized swelling, mass and lump, head: Secondary | ICD-10-CM

## 2015-02-08 DIAGNOSIS — R221 Localized swelling, mass and lump, neck: Secondary | ICD-10-CM | POA: Diagnosis not present

## 2015-02-08 DIAGNOSIS — R590 Localized enlarged lymph nodes: Secondary | ICD-10-CM | POA: Diagnosis not present

## 2015-02-08 MED ORDER — IOPAMIDOL (ISOVUE-300) INJECTION 61%
75.0000 mL | Freq: Once | INTRAVENOUS | Status: AC | PRN
  Administered 2015-02-08: 75 mL via INTRAVENOUS

## 2015-02-12 ENCOUNTER — Ambulatory Visit (HOSPITAL_COMMUNITY)
Admission: RE | Admit: 2015-02-12 | Discharge: 2015-02-12 | Disposition: A | Discharge status: Home / Self Care | Payer: Medicare Other | Source: Ambulatory Visit | Attending: Cardiology | Admitting: Cardiology

## 2015-02-12 ENCOUNTER — Encounter (HOSPITAL_COMMUNITY): Payer: Self-pay

## 2015-02-12 VITALS — BP 102/58 | HR 78 | Wt 181.8 lb

## 2015-02-12 DIAGNOSIS — I428 Other cardiomyopathies: Secondary | ICD-10-CM

## 2015-02-12 DIAGNOSIS — I10 Essential (primary) hypertension: Secondary | ICD-10-CM | POA: Insufficient documentation

## 2015-02-12 DIAGNOSIS — I447 Left bundle-branch block, unspecified: Secondary | ICD-10-CM | POA: Insufficient documentation

## 2015-02-12 DIAGNOSIS — K219 Gastro-esophageal reflux disease without esophagitis: Secondary | ICD-10-CM | POA: Insufficient documentation

## 2015-02-12 DIAGNOSIS — Z86711 Personal history of pulmonary embolism: Secondary | ICD-10-CM | POA: Insufficient documentation

## 2015-02-12 DIAGNOSIS — R0789 Other chest pain: Secondary | ICD-10-CM

## 2015-02-12 DIAGNOSIS — Z7902 Long term (current) use of antithrombotics/antiplatelets: Secondary | ICD-10-CM | POA: Insufficient documentation

## 2015-02-12 DIAGNOSIS — I429 Cardiomyopathy, unspecified: Secondary | ICD-10-CM | POA: Diagnosis not present

## 2015-02-12 DIAGNOSIS — Z79899 Other long term (current) drug therapy: Secondary | ICD-10-CM | POA: Diagnosis not present

## 2015-02-12 DIAGNOSIS — E119 Type 2 diabetes mellitus without complications: Secondary | ICD-10-CM | POA: Diagnosis not present

## 2015-02-12 DIAGNOSIS — J45909 Unspecified asthma, uncomplicated: Secondary | ICD-10-CM | POA: Insufficient documentation

## 2015-02-12 DIAGNOSIS — Z853 Personal history of malignant neoplasm of breast: Secondary | ICD-10-CM | POA: Insufficient documentation

## 2015-02-12 DIAGNOSIS — E785 Hyperlipidemia, unspecified: Secondary | ICD-10-CM | POA: Diagnosis not present

## 2015-02-12 DIAGNOSIS — I480 Paroxysmal atrial fibrillation: Secondary | ICD-10-CM | POA: Diagnosis not present

## 2015-02-12 DIAGNOSIS — R590 Localized enlarged lymph nodes: Secondary | ICD-10-CM | POA: Insufficient documentation

## 2015-02-12 DIAGNOSIS — R0602 Shortness of breath: Secondary | ICD-10-CM

## 2015-02-12 DIAGNOSIS — R0600 Dyspnea, unspecified: Secondary | ICD-10-CM | POA: Diagnosis not present

## 2015-02-12 DIAGNOSIS — I200 Unstable angina: Secondary | ICD-10-CM

## 2015-02-12 MED ORDER — SPIRONOLACTONE 25 MG PO TABS: 25.0000 mg | ORAL_TABLET | Freq: Every day | ORAL | Status: DC

## 2015-02-12 MED ORDER — FUROSEMIDE 20 MG PO TABS: 20.0000 mg | ORAL_TABLET | Freq: Every day | ORAL | Status: DC

## 2015-02-12 MED ORDER — APIXABAN 5 MG PO TABS: 5.0000 mg | ORAL_TABLET | Freq: Two times a day (BID) | ORAL | Status: DC

## 2015-02-12 NOTE — Progress Notes (Signed)
Patient ID: [name], female   DOB: 1946-04-12, 69 y.o.   MRN: [id number] PCP: Dr. Dennard Schaumann  69 yo with history of HTN and type II diabetes presents for evaluation of nonischemic cardiomyopathy.  Back in 2009, she had an echo with normal EF.  She was admitted in 9/13 with chest pain and LBBB.  She had LHC showing no CAD but EF 35-40%.  Echo was a technically difficult study with EF reported as 50-55%.  She has been taking ARB and Coreg.  Repeat echo in 12/13 showed EF 40% with septal hypokinesis.  Cardiac MRI was done in 12/13 as well, with calculated EF 50%, global hypokinesis, and no delayed enhancement.  Echo in 7/15 showed EF 55-60% with mild MR and mild TR (no LBBB at this time).   In 3/16, she began to develop increased dyspnea.  She was seen by Truitt Merle and was noted to have a LBBB again. Echo in 3/16 showed EF down to 35-40%.  She was then seen in followup by Rosaria Ferries.  Alivecor on her phone showed a period of HR up to 160s that appeared to be atrial fibrillation.  She felt tachypalpitations. Coreg was increased and she was started on apixaban 5 mg bid.  At last appointment, I started her on a low dose of Lasix.  CPX in 4/16 showed a submaximal effort but normal spirometry and normal peak VO2.  She did have ST changes possibly concerning for ischemia (but has baseline LBBB so hard to interpret).    She has not felt any further significant tachypalpitations.  Holter in 4/16 showed a five beat run of SVT, otherwise unremarkable.  She has been on apixaban without melena or BRBPR.  Overall, she is doing better.  Weight is down 2 lbs.  She has been gardening without much problem.  She can run the tiller.  She is still short of breath walking up mild inclines, which is new.  No significant problems on flat ground.  She has had right supraclavicular area pain.  CT showed small nodes that are stable back to 2009 and unlikely to be malignant. No chest pain/pressure. No snoring, no marked daytime  sleepiness.   ECG: NSR, LBBB  Labs (9/13): TSH 4.884 (elevated), proBNP 155 Labs (10/13): K 3.6, creatinine 0.8 Labs (1/14): K 3.7, creatinine 0.8, BNP not elevated, ANA weakly positive, TSH normal Labs (7/14): BNP normal Labs (11/14): K 4.1, creatinine 0.73 Labs (10/15): K 4.2, creatinine 0.73, LDL 45 Labs (3/16): BNP 46, HCT 42.2, TSH normal Labs (4/16): K 4.1, creatinine 0.78 => 0.8, HCT 39.3  PMH: 1. LBBB/IVCD: Now persistent.   2. Breast cancer s/p bilateral mastectomy. 3. GERD 4. Type II diabetes mellitus 5. HTN 6. Asthma 7. PE in 2001 8. Hyperlipidemia 9. Nonischemic Cardiomyopathy: Echo 8/09 with 60%.  Admitted in 9/13 with chest pain.  LHC showed no angiographic CAD and EF 35-40%.  Echo at that time was read as showing EF 50-55% but was a technically difficult study.  Echo (12/13): EF 40% with septal hypokinesis, normal RV size and systolic function, no significant valvular abnormalities.  Cardiac MRI (12/13): EF 50%, global hypokinesis, no delayed enhancement.  Echo (7/14) with EF 50-55%, mild LV dilation, mild LAE.  Echo (7/15) with EF 55-60%, mild MR and mild TR.  Echo (3/16) with EF 35-40%, septal-lateral dyssynchrony.  CPX (4/16) with RER 0.98 (submaximal), peak VO2 19.5, VE/VCO2 slope 29.4 => normal spirometry, normal peak VO2, but ST changes suggest possibility of ischemia.  10. Supraclavicular lymphadenopathy: Stable by CT back to 2009, suspect benign.  11. Atrial fibrillation: Paroxysmal.  Holter (4/16) with rare PACs and PVCs, short run 5 beats SVT.   SH: Married, lives in [facility], never smoked.  FH: Uncle with ? SCD.  Brother with ? SCD.    Current Outpatient Prescriptions  Medication Sig Dispense Refill  . apixaban (ELIQUIS) 5 MG TABS tablet Take 1 tablet (5 mg total) by mouth 2 (two) times daily. 90 tablet 3  . atorvastatin (LIPITOR) 20 MG tablet TAKE 2 TABLETS (40 MG TOTAL) BY MOUTH EVERY EVENING. 60 tablet 2  . Blood Glucose Monitoring Suppl  (ONE TOUCH ULTRA 2) W/DEVICE KIT Check FBS qam - Dx:250.00 and she will need lancets (1 box) , strips (1 bottle = 100) and controls also thanks 1 each 0  . Calcium Carbonate-Vitamin D (CALCIUM-VITAMIN D) 500-200 MG-UNIT per tablet Take 1 tablet by mouth 2 (two) times daily with a meal.     . carvedilol (COREG) 12.5 MG tablet Take 1 tablet (12.5 mg total) by mouth 2 (two) times daily. Take an extra 1/2 tablet for sustained elevated heart rate over 120, up to twice daily. 210 tablet 3  . cetirizine (ZYRTEC) 10 MG tablet Take 10 mg by mouth every evening.    Marland Kitchen CINNAMON PO Take 1 tablet by mouth daily.      . Coenzyme Q10 (CO Q 10 PO) Take 1 capsule by mouth daily.      . cyanocobalamin 500 MCG tablet Take 500 mcg by mouth daily. 10/25/12--Pt not sure of dose    . DEXILANT 60 MG capsule TAKE 1 CAPSULE (60 MG TOTAL) BY MOUTH DAILY. 90 capsule 3  . diclofenac (VOLTAREN) 75 MG EC tablet Take 1 tablet (75 mg total) by mouth 2 (two) times daily. 60 tablet 0  . digoxin (LANOXIN) 0.125 MG tablet Take 1 tablet (0.125 mg total) by mouth daily. Take two tablets for first dose. 90 tablet 3  . fluticasone (FLONASE) 50 MCG/ACT nasal spray INHALE 2 SPRAYS IN EACH NOSTRIL DAILY FOR 3 MONTHS 48 g 1  . furosemide (LASIX) 20 MG tablet Take 1 tablet (20 mg total) by mouth daily. 30 tablet 6  . Glucosamine-Chondroitin (GLUCOSAMINE CHONDR COMPLEX PO) Take 1 tablet by mouth daily.      . Multiple Vitamins-Minerals (MULTIVITAMINS THER. W/MINERALS) TABS Take 1 tablet by mouth daily.      . nitroGLYCERIN (NITROSTAT) 0.4 MG SL tablet Place 1 tablet (0.4 mg total) under the tongue every 5 (five) minutes as needed for chest pain. 25 tablet prn  . OVER THE COUNTER MEDICATION Take 1 tablet by mouth daily. Leg cramps    . Probiotic Product (RESTORA) CAPS 1 cap po qd 30 capsule 11  . spironolactone (ALDACTONE) 25 MG tablet Take 1 tablet (25 mg total) by mouth daily. 15 tablet 3  . SYMBICORT 160-4.5 MCG/ACT inhaler Inhale 2 puffs into  the lungs as needed. For wheezing  3  . valsartan (DIOVAN) 160 MG tablet TAKE 1 AND 1/2 TABLETS (TOTAL 240MG) DAILY 135 tablet 3  . vitamin C (ASCORBIC ACID) 500 MG tablet Take 500 mg by mouth 2 (two) times daily.      . WELCHOL 625 MG tablet CMD FOR 90D TAKE 3 TABLETS (1,875 MG TOTAL) BY MOUTH 2 (TWO) TIMES DAILY WITH A MEAL. 270 tablet 3  . WELCHOL 625 MG tablet TAKE 3 TABLETS (1,875 MG TOTAL) BY MOUTH 2 (TWO) TIMES DAILY WITH A MEAL. [place]  tablet 3  . zafirlukast (ACCOLATE) 20 MG tablet TAKE 1 TABLET (20 MG TOTAL) BY MOUTH 2 (TWO) TIMES DAILY. 180 tablet 1   No current facility-administered medications for this encounter.    BP 102/58 mmHg  Pulse 78  Wt 181 lb 12 oz (82.441 kg)  SpO2 97% General: NAD Neck: No JVD, no thyromegaly or thyroid nodule.  Lungs: Clear to auscultation bilaterally with normal respiratory effort. CV: Nondisplaced PMI.  Heart regular S1/S2 with paradoxical S2 split, no S3/S4, no murmur.  No peripheral edema.  No carotid bruit.  Normal pedal pulses.  Abdomen: Soft, nontender, no hepatosplenomegaly, no distention.  Neurologic: Alert and oriented x 3.  Psych: Normal affect. Extremities: No clubbing or cyanosis.   Assessment/Plan: 1. Nonischemic cardiomyopathy: NYHA class II-III symptoms (dyspnea with relatively heavy exertion), not volume overloaded on exam.  Symptoms do seem to have improved recently.  No CAD on cath 9/13 but EF 35-40% at that time.  ECG showed LBBB.  LBBB resolved and EF normalized.  However, more recently, patient has had some fatigue and dyspnea => repeat echo with EF back down to 35-40%.  No chest pain.  This may be a LBBB cardiomyopathy.  CPX in 4/16 was submaximal but showed normal spirometry an actually near normal peak VO2.  However, there were ECG changes possibly suggestive of ischemia.  This is difficult to interpret with baseline LBBB.  - Continue Coreg and valsartan at current doses.  - Can continue digoxin, check level with labs in 1  week.  - Increase spironolactone to 25 mg daily with BMET in 1 week.   - She does not looks volume overloaded on exam, continue current Lasix.  - Given findings on CPX, would arrange for Lexiscan Cardiolite.  As she is improving symptomatically, I do not think that we have to jump right to cardiac cath.  - Repeat echo in 9/16.  2. Hyperlipidemia: Continue statin given risk factors (diabetes, HTN).  Good lipids in 10/15.  3. Atrial fibrillation: Paroxysmal.  Noted on Alivecor from phone.  No recent tachypalpitations.  Holter monitor in 4/16 showed a short runs SVT (5 beats).  Continue Eliquis 5 mg bid. CBC with labs in 1 week.  I think we need to consider Linq monitor implantation as I am not totally sure about this atrial fibrillation diagnosis and am somewhat reluctant to commit her to long-term anticoagulation without better evidence.  Will talk to her about this after stress test.   4. LBBB: This was transient in the past, now seems persistent.  LBBB itself can contribute to cardiomyopathy.    Loralie Champagne 02/12/2015

## 2015-02-12 NOTE — Patient Instructions (Addendum)
INCREASE Spironolactone to 25mg  daily (1 tablet)  LABS and MYOVIEW (lexiscan) in 1 WEEK.   FOLLOW UP in 2 MONTHS.

## 2015-02-13 ENCOUNTER — Telehealth (HOSPITAL_COMMUNITY): Payer: Self-pay | Admitting: Vascular Surgery

## 2015-02-13 DIAGNOSIS — E785 Hyperlipidemia, unspecified: Secondary | ICD-10-CM

## 2015-02-13 DIAGNOSIS — I200 Unstable angina: Secondary | ICD-10-CM

## 2015-02-13 DIAGNOSIS — I428 Other cardiomyopathies: Secondary | ICD-10-CM

## 2015-02-13 DIAGNOSIS — R0602 Shortness of breath: Secondary | ICD-10-CM

## 2015-02-13 MED ORDER — APIXABAN 5 MG PO TABS: 5.0000 mg | ORAL_TABLET | Freq: Two times a day (BID) | ORAL | Status: DC

## 2015-02-13 MED ORDER — FUROSEMIDE 20 MG PO TABS: 20.0000 mg | ORAL_TABLET | Freq: Every day | ORAL | Status: DC

## 2015-02-13 MED ORDER — SPIRONOLACTONE 25 MG PO TABS: 25.0000 mg | ORAL_TABLET | Freq: Every day | ORAL | Status: DC

## 2015-02-13 NOTE — Telephone Encounter (Signed)
Pt left message she she was told to increase some of her medications while she was her yesterday, she only got 15 tabs of Spironlactone , potassium and lasix she would to speak to a nurse to verfiy her dosage and quantity of medication.. please advise

## 2015-02-14 ENCOUNTER — Other Ambulatory Visit: Payer: Self-pay | Admitting: Family Medicine

## 2015-02-14 ENCOUNTER — Encounter: Payer: Self-pay | Admitting: Cardiology

## 2015-02-14 DIAGNOSIS — R221 Localized swelling, mass and lump, neck: Secondary | ICD-10-CM

## 2015-02-19 ENCOUNTER — Telehealth (HOSPITAL_COMMUNITY): Payer: Self-pay | Admitting: *Deleted

## 2015-02-19 NOTE — Telephone Encounter (Signed)
Patient given detailed instructions per Myocardial Perfusion Study Information Sheet for test on 02/20/15 at 0830. Patient verbalized understanding. Adarrius Graeff, Ranae Palms

## 2015-02-20 ENCOUNTER — Ambulatory Visit (HOSPITAL_COMMUNITY): Payer: Medicare Other | Attending: Cardiovascular Disease

## 2015-02-20 ENCOUNTER — Other Ambulatory Visit (INDEPENDENT_AMBULATORY_CARE_PROVIDER_SITE_OTHER): Payer: Medicare Other | Admitting: *Deleted

## 2015-02-20 DIAGNOSIS — I447 Left bundle-branch block, unspecified: Secondary | ICD-10-CM | POA: Insufficient documentation

## 2015-02-20 DIAGNOSIS — R0789 Other chest pain: Secondary | ICD-10-CM | POA: Diagnosis not present

## 2015-02-20 DIAGNOSIS — I200 Unstable angina: Secondary | ICD-10-CM

## 2015-02-20 DIAGNOSIS — E119 Type 2 diabetes mellitus without complications: Secondary | ICD-10-CM | POA: Diagnosis not present

## 2015-02-20 DIAGNOSIS — I10 Essential (primary) hypertension: Secondary | ICD-10-CM | POA: Diagnosis not present

## 2015-02-20 DIAGNOSIS — R0609 Other forms of dyspnea: Secondary | ICD-10-CM | POA: Diagnosis not present

## 2015-02-20 DIAGNOSIS — R002 Palpitations: Secondary | ICD-10-CM | POA: Diagnosis not present

## 2015-02-20 LAB — MYOCARDIAL PERFUSION IMAGING
CHL CUP NUCLEAR SSS: 7
CHL CUP STRESS STAGE 1 DBP: 53 mmHg
CHL CUP STRESS STAGE 1 SBP: 119 mmHg
CHL CUP STRESS STAGE 1 SPEED: 0 mph
CHL CUP STRESS STAGE 2 GRADE: 0 %
CHL CUP STRESS STAGE 2 HR: 61 {beats}/min
CHL CUP STRESS STAGE 3 GRADE: 0 %
CHL CUP STRESS STAGE 3 SBP: 114 mmHg
CHL CUP STRESS STAGE 3 SPEED: 0 mph
CHL CUP STRESS STAGE 4 HR: 92 {beats}/min
CHL CUP STRESS STAGE 5 DBP: 53 mmHg
CHL CUP STRESS STAGE 5 SPEED: 0 mph
CHL CUP STRESS STAGE 6 DBP: 55 mmHg
CHL CUP STRESS STAGE 6 GRADE: 0 %
CHL CUP STRESS STAGE 6 SPEED: 0 mph
LV dias vol: 95 mL
LV sys vol: 40 mL
Nuc Stress EF: 58 %
Peak HR: 92 {beats}/min
Percent of predicted max HR: 61 %
RATE: 0.31
Rest HR: 64 {beats}/min
SDS: 3
SRS: 4
Stage 1 Grade: 0 %
Stage 1 HR: 61 {beats}/min
Stage 2 Speed: 0 mph
Stage 3 DBP: 70 mmHg
Stage 3 HR: 85 {beats}/min
Stage 4 Grade: 0 %
Stage 4 Speed: 0 mph
Stage 5 Grade: 0 %
Stage 5 HR: 87 {beats}/min
Stage 5 SBP: 111 mmHg
Stage 6 HR: 76 {beats}/min
Stage 6 SBP: 91 mmHg
TID: 0.87

## 2015-02-20 LAB — BASIC METABOLIC PANEL
BUN: 16 mg/dL (ref 6–23)
CO2: 24 meq/L (ref 19–32)
Calcium: 9.4 mg/dL (ref 8.4–10.5)
Chloride: 105 mEq/L (ref 96–112)
Creatinine, Ser: 0.74 mg/dL (ref 0.40–1.20)
GFR: 82.27 mL/min (ref >60.00)
Glucose, Bld: 138 mg/dL — ABNORMAL HIGH (ref 70–99)
Potassium: 4.7 mEq/L (ref 3.5–5.1)
Sodium: 138 mEq/L (ref 135–145)

## 2015-02-20 LAB — CBC WITH DIFFERENTIAL/PLATELET
Basophils Absolute: 0 10*3/uL (ref 0.0–0.1)
Basophils Relative: 0.5 % (ref 0.0–3.0)
EOS ABS: 0.1 10*3/uL (ref 0.0–0.7)
Eosinophils Relative: 2.2 % (ref 0.0–5.0)
HEMATOCRIT: 38.7 % (ref 36.0–46.0)
HEMOGLOBIN: 13.2 g/dL (ref 12.0–15.0)
Lymphocytes Relative: 32.3 % (ref 12.0–46.0)
Lymphs Abs: 1.9 10*3/uL (ref 0.7–4.0)
MCHC: 34.1 g/dL (ref 30.0–36.0)
MCV: 93.4 fl (ref 78.0–100.0)
MONO ABS: 0.5 10*3/uL (ref 0.1–1.0)
Monocytes Relative: 8.6 % (ref 3.0–12.0)
NEUTROS ABS: 3.4 10*3/uL (ref 1.4–7.7)
Neutrophils Relative %: 56.4 % (ref 43.0–77.0)
Platelets: 228 10*3/uL (ref 150.0–400.0)
RBC: 4.14 Mil/uL (ref 3.87–5.11)
RDW: 13.5 % (ref 11.5–15.5)
WBC: 6 10*3/uL (ref 4.0–10.5)

## 2015-02-20 MED ORDER — TECHNETIUM TC 99M SESTAMIBI GENERIC - CARDIOLITE
11.0000 | Freq: Once | INTRAVENOUS | Status: AC | PRN
  Administered 2015-02-20: 11 via INTRAVENOUS

## 2015-02-20 MED ORDER — TECHNETIUM TC 99M SESTAMIBI GENERIC - CARDIOLITE
30.0000 | Freq: Once | INTRAVENOUS | Status: AC | PRN
  Administered 2015-02-20: 30 via INTRAVENOUS

## 2015-02-20 MED ORDER — REGADENOSON 0.4 MG/5ML IV SOLN
0.4000 mg | Freq: Once | INTRAVENOUS | Status: AC
  Administered 2015-02-20: 0.4 mg via INTRAVENOUS

## 2015-02-20 NOTE — Addendum Note (Signed)
Addended by: Eulis Foster on: 02/20/2015 09:22 AM   Modules accepted: Orders

## 2015-02-20 NOTE — Addendum Note (Signed)
Addended by: Eulis Foster on: 02/20/2015 09:23 AM   Modules accepted: Orders

## 2015-02-21 LAB — DIGOXIN LEVEL: Digoxin Level: 1.5 ng/mL (ref 0.8–2.0)

## 2015-02-22 DIAGNOSIS — M542 Cervicalgia: Secondary | ICD-10-CM | POA: Diagnosis not present

## 2015-02-22 DIAGNOSIS — R591 Generalized enlarged lymph nodes: Secondary | ICD-10-CM | POA: Diagnosis not present

## 2015-02-27 ENCOUNTER — Telehealth (HOSPITAL_COMMUNITY): Payer: Self-pay | Admitting: Cardiology

## 2015-02-27 NOTE — Telephone Encounter (Signed)
-----   Message from Larey Dresser, MD sent at 02/21/2015 10:41 PM EDT ----- Stop digoxin.  EF was back up to normal on Cardiolite.

## 2015-02-27 NOTE — Telephone Encounter (Signed)
Pt aware to d/c digoxin Pt states she feels something is not right stilll has decreased energy and very fatigued Advised pt to keep close watch on sxs, take medications as prescribed, and return call if she does not feel any better as her EF has improved

## 2015-03-01 NOTE — Telephone Encounter (Signed)
Patient had CT anigio, negative for PE and BNP reported

## 2015-03-27 ENCOUNTER — Other Ambulatory Visit: Payer: Self-pay | Admitting: Physician Assistant

## 2015-04-08 ENCOUNTER — Other Ambulatory Visit: Payer: Self-pay | Admitting: Family Medicine

## 2015-04-11 DIAGNOSIS — M542 Cervicalgia: Secondary | ICD-10-CM | POA: Diagnosis not present

## 2015-04-16 ENCOUNTER — Encounter: Payer: Self-pay | Admitting: Genetic Counselor

## 2015-05-02 ENCOUNTER — Telehealth: Payer: Self-pay | Admitting: Genetic Counselor

## 2015-05-02 NOTE — Telephone Encounter (Signed)
F/U Genetic appt-schedule genetic f/u appt for 08/16 @ 10 w/Kayla Boggs

## 2015-05-19 ENCOUNTER — Other Ambulatory Visit: Payer: Self-pay | Admitting: Family Medicine

## 2015-05-21 ENCOUNTER — Ambulatory Visit (HOSPITAL_BASED_OUTPATIENT_CLINIC_OR_DEPARTMENT_OTHER): Payer: Medicare Other | Admitting: Genetic Counselor

## 2015-05-21 ENCOUNTER — Other Ambulatory Visit: Payer: Medicare Other

## 2015-05-21 ENCOUNTER — Encounter: Payer: Self-pay | Admitting: Genetic Counselor

## 2015-05-21 DIAGNOSIS — Z853 Personal history of malignant neoplasm of breast: Secondary | ICD-10-CM | POA: Diagnosis not present

## 2015-05-21 DIAGNOSIS — Z315 Encounter for genetic counseling: Secondary | ICD-10-CM

## 2015-05-21 DIAGNOSIS — Z809 Family history of malignant neoplasm, unspecified: Secondary | ICD-10-CM

## 2015-05-21 DIAGNOSIS — Z803 Family history of malignant neoplasm of breast: Secondary | ICD-10-CM

## 2015-05-21 DIAGNOSIS — Z801 Family history of malignant neoplasm of trachea, bronchus and lung: Secondary | ICD-10-CM

## 2015-05-21 DIAGNOSIS — Z800 Family history of malignant neoplasm of digestive organs: Secondary | ICD-10-CM | POA: Diagnosis not present

## 2015-05-21 DIAGNOSIS — C50911 Malignant neoplasm of unspecified site of right female breast: Secondary | ICD-10-CM | POA: Diagnosis not present

## 2015-05-21 DIAGNOSIS — C50912 Malignant neoplasm of unspecified site of left female breast: Secondary | ICD-10-CM

## 2015-05-21 NOTE — Progress Notes (Signed)
REFERRING PROVIDER: Heath Lark, MD Roma Kayser, MS, CGC  PRIMARY PROVIDER:  Odette Fraction, MD  PRIMARY REASON FOR VISIT:  1. Breast cancer, left breast   2. Breast cancer, right breast   3. Family history of breast cancer in female   46. Family history of colon cancer   5. Family history of stomach cancer   6. Family history of lung cancer   7. Family history of cancer      HISTORY OF PRESENT ILLNESS:   [name], a 69 y.o. female, was first seen for a [facility] cancer genetics consultation around 2008 due to her personal history of metachronous, bilateral breast cancer and family history of cancer.  [name] presents to clinic today, following receipt of our recall letter regarding updated genetic testing to discuss the possibility of a hereditary predisposition to cancer, new genetic testing options, and to further clarify her future cancer risks, as well as potential cancer risks for family members.   In 1999, at the age of 33, [name] was diagnosed with cancer of the left breast.  Hormone receptor status was reportedly ER-. This was treated with left mastectomy chemotherapy, and tamoxifen.  In 2008, at the age of 51, [name] was diagnosed with invasive ductal carcinoma with DCIS of the right breast.  Hormone receptor status was ER/PR+, HER2-, with a Ki67 of 26%.  This was treated with right mastectomy and chemotherapy.  CANCER HISTORY:   No history exists.  1999 - left breast cancer; ER- 2008 - IDC + DCIS of right breast; ER/PR+, HER2-, Ki67 fo 26%   HORMONAL RISK FACTORS:  Menarche was at age 55.  First live birth at age 5.  OCP use for approximately less than one year.  Ovaries intact: yes.  Hysterectomy: no.  Menopausal status: postmenopausal.  HRT use: 2 years. Colonoscopy: yes; normal. Mammogram within the last year: n/a. Number of breast biopsies: 2. Up to date with pelvic exams:  yes. Any excessive radiation exposure in the past:  no  Past  Medical History  Diagnosis Date  . Cancer     breast CA  . Asthma   . GERD (gastroesophageal reflux disease)   . DM2 (diabetes mellitus, type 2)   . Arthritis   . Allergy history unknown   . Hiatal hernia   . Leg cramps   . Hypertension   . Elevated cholesterol   . NICM (nonischemic cardiomyopathy)     a. Echo:  06/14/12: Poor endocardial definition, possible septal HK, EF 50-55%, normal wall motion, mild LAE ;  b.  [facility] 9/13:  normal cors, EF 35-40%,  c. Echo 7/14: EF 50-55%, mild LAE  . LBBB (left bundle branch block) 10/27/2012  . Breast cancer, left breast 10/27/2012    3.2 cm ER positive, Her-2 negative, 1 node positive S/P mastectomy/TTRAM reconstruction 4/08  . Breast cancer, right breast 10/27/2012    3.2 cm ER/PR positive, 1 node positive, Her-2 negative Rx w mastectomy, AC -T chemo, followed by aromatase inhibitor x 5 years dx 4/08  . Diabetes   . Other fatigue 01/01/2015  . Breast cancer 1999; 2008    hx of bilateral breast cancer    Past Surgical History  Procedure Laterality Date  . Mastectomy      bilateral  . Knee surgery      left  . Dilation and curettage of uterus    . Left heart catheterization with coronary angiogram N/A 06/15/2012    Procedure: LEFT HEART CATHETERIZATION  WITH CORONARY ANGIOGRAM;  Surgeon: Wellington Hampshire, MD;  Location: [facility] CATH LAB;  Service: Cardiovascular;  Laterality: N/A;    Social History   Social History  . Marital Status: Married    Spouse Name: N/A  . Number of Children: 2  . Years of Education: N/A   Occupational History  . retired     post Furniture conservator/restorer   Social History Main Topics  . Smoking status: Never Smoker   . Smokeless tobacco: Never Used  . Alcohol Use: No  . Drug Use: No  . Sexual Activity: No   Other Topics Concern  . None   Social History Narrative     FAMILY HISTORY:  We obtained a detailed, 4-generation family history.  Significant diagnoses are listed below: Family History  Problem  Relation Age of Onset  . Asthma Maternal Grandmother   . Breast cancer Maternal Grandmother     dx. 105s  . Stomach cancer Maternal Grandmother 60    lots of stomach problems  . Heart attack Paternal Grandfather   . Other Daughter     cyst on ovary; unilateral oophorectomy  . Heart attack Brother   . Cancer Maternal Aunt     unknown type  . Celiac disease Maternal Aunt   . Diabetes Maternal Uncle   . Colon cancer Cousin     dx. 50s-60s  . Colon cancer Paternal Aunt     dx. late 60s  . Lung cancer Paternal Aunt     dx. late 60s-70s; former smoker  . Heart attack Paternal Aunt   . Heart attack Paternal Uncle     [name] has one daughter, age 58, and one son, age 6.  Her daughter has a history of unilateral oophorectomy due to an ovarian cyst, but has not had cancer.  [name] has one full brother who passed away at 20-60 of a heart attack.  Both of her parents died in a car accident while they were in their 65s.    [name]'s mother had three full brothers and one full sister.  Two brothers passed away in their 24s-80s; one passed away in his 26s from diabetes-related complications.  [name]'s sister died in her 82s-80s and had a history of cancer, but [name] is unaware of what type of cancer.  This sister had two sons and one daughter.  Her daughter was diagnosed with colon cancer in her 17s-60s, and [name] reports that, based on family gossip, this cousin could potentially be a paternal half sister.  [name] maternal grandmother died at 3; she was diagnosed with breast cancer in her 38s and stomach cancer sometime before that ([name] believes maybe around the age of 20).  [name] maternal grandfather died in his late 43s.  [name] father had one full sister and four full brothers.  Additionally he had three half brothers and two half sisters--some of which [name] has limited information for.  Of [name] full siblings, two brothers died in  their 49s and 30s--both in motor vehicle accidents.  Two other brothers passed away in their 32s-70s.  One of his sisters passed away at 26, the other died at 82 of a heart attack and had a history of colon cancer diagnosed in her late 9s, as well as lung cancer in her late 40s-70s (she was a former smoker).  [name] paternal grandmother died at 58, but never had cancer.  Her grandfather died in his 89s  and she thinks he might have had lung cancer, but was not sure.  She is unaware of any additional cancer diagnoses in the family.  Patient's maternal ancestors are of Irish/English descent, and paternal ancestors are of Scotch-Irish descent. There is no reported Ashkenazi Jewish ancestry. There no known consanguinity.  GENETIC COUNSELING ASSESSMENT: Victoria PUZZO is a 69 y.o. female with a personal and family history of cancer which is somewhat suggestive of a hereditary cancer syndrome and predisposition to cancer. We, therefore, discussed and recommended the following at today's visit.   DISCUSSION: We reviewed the characteristics, features and inheritance patterns of hereditary cancer syndromes, particularly those caused by mutations within the BRCA1/2 and Lynch syndrome genes. We also discussed genetic testing, including the appropriate family members to test, the process of testing, insurance coverage and turn-around-time for results. We discussed that testing for BRCA1/2 mutations has been updated since Ms. Buchta last had genetic testing and also that we now know of additional genes associated with an increased risk for breast and other related cancers.  We discussed the implications of a negative, positive and/or variant of uncertain significant result. We recommended Ms. Tweten pursue genetic testing for the 20-gene Breast/Ovarian Cancer Panel through [facility] Hope Pigeon, MD).  The Breast/Ovarian Cancer Panel offered by [facility] includes sequencing and deletion/duplication analysis  for the following 19 genes:  ATM, BARD1, BRCA1, BRCA2, BRIP1, CDH1, CHEK2, FANCC, MLH1, MSH2, MSH6, NBN, PALB2, PMS2, PTEN, RAD51C, RAD51D, TP53, and XRCC2.  This panel also includes deletion/duplication analysis (without sequencing) for one gene, EPCAM.   We discussed that genetic testing guidelines have changed somewhat since she had genetic testing.  Based on these updated guidelines, Ms. Guldin does not meet insurance criteria for genetic testing.  We discussed her family history, including her parents young ages at death, the limited information for some relatives, as well as the somewhat limited family history of cancer.  We discussed that there is a probably a low likelihood that we will obtain a positive result with updated genetic testing, but Ms. Heiden is highly motivated to have genetic testing for the benefit of her children.    Since she had genetic testing previously, we discussed that we could send a blood sample to the testing laboratory and see if this testing would be covered by her insurance company.  If it is not covered by her insurance company, she would always have the option of cancelling that test or we could discuss potentially cheaper genetic testing options at that point in time.  We discussed that if her out of pocket cost for testing is over $100, the laboratory will call and confirm whether she wants to proceed with testing.  If the out of pocket cost of testing is less than $100 she will be billed by the genetic testing laboratory.  PLAN: After considering the risks, benefits, and limitations, Ms. Fangman  provided informed consent to pursue genetic testing and the blood sample was sent to [facility] for analysis of the 20-gene Breast/Ovarian Cancer Panel test. Results should be available within approximately 2-3 weeks' time, at which point they will be disclosed by telephone to Ms. Chesterfield, as will any additional recommendations warranted by these results. Ms. Gau  will receive a summary of her genetic counseling visit and a copy of her results once available. This information will also be available in Epic. We encouraged Ms. Dang to remain in contact with cancer genetics annually so that we can continuously update the family history and  inform her of any changes in cancer genetics and testing that may be of benefit for her family. Ms. Dehaas questions were answered to her satisfaction today. Our contact information was provided should additional questions or concerns arise.  Thank you for the referral and allowing [place] to share in the care of your patient.   Jeanine Luz, MS Genetic Counselor [email or web address] Phone: [phone]  The patient was seen for a total of 60 minutes in face-to-face genetic counseling.  This patient was discussed with Drs. Magrinat, Lindi Adie and/or Burr Medico who agrees with the above.    _______________________________________________________________________ For Office Staff:  Number of people involved in session: 1 Was an Intern/ student involved with case: no

## 2015-06-05 ENCOUNTER — Telehealth: Payer: Self-pay | Admitting: Genetic Counselor

## 2015-06-05 ENCOUNTER — Ambulatory Visit: Payer: Self-pay | Admitting: Genetic Counselor

## 2015-06-05 DIAGNOSIS — Z1379 Encounter for other screening for genetic and chromosomal anomalies: Secondary | ICD-10-CM

## 2015-06-05 NOTE — Telephone Encounter (Signed)
Discussed that [name]'s genetic test results were negative for mutations within any of 20 genes that would cause her to be at an increased risk for breast, ovarian, or other related cancers.  Additionally, no variants of uncertain significance were found (VUSs).  We still do not have an explanation for the family history of cancer, but most cancers just happen by chance.  Women in the family are still considered to be at an increased risk for breast cancer just based on the family history.  They should begin annual mammogram screening at the age of 82.  Thus, [name] daughter should continue to have annual mammograms.  Self breast examinations and clinical breast examinations are also recommended for female relatives.  [name] is welcome to call with any further questions.

## 2015-06-14 DIAGNOSIS — Z1379 Encounter for other screening for genetic and chromosomal anomalies: Secondary | ICD-10-CM | POA: Insufficient documentation

## 2015-06-14 NOTE — Progress Notes (Signed)
GENETIC TEST RESULTS  HPI: Ms. Leveille was previously seen in the [place] clinic in 2008 when she had negative BRCAnalysis testing through [employer].  She returned for genetic evaluation recently for updated genetic testing. Please refer to our prior cancer genetics clinic note from May 21, 2015 for more information regarding Ms. Perin's medical, social and family histories, and our assessment and recommendations, at the time. Ms. Modeste recent genetic test results were disclosed to her, as were recommendations warranted by these results. These results and recommendations are discussed in more detail below.  GENETIC TEST RESULTS: At the time of Ms. Bourassa's visit on 05/21/15, we recommended she pursue genetic testing of the 20-gene Breast/Ovarian Cancer Panel through [facility] Hope Pigeon, MD).  The Breast/Ovarian gene panel offered by [facility] includes sequencing and deletion/duplication analysis for the following 19 genes:  ATM, BARD1, BRCA1, BRCA2, BRIP1, CDH1, CHEK2, FANCC, MLH1, MSH2, MSH6, NBN, PALB2, PMS2, PTEN, RAD51C, RAD51D, TP53, and XRCC2.  This panel also includes deletion/duplication analysis (without sequencing) for one gene, EPCAM.  Those results are now back, the report date for which is June 03, 2015.  Genetic testing was normal, and did not reveal a deleterious mutation in these genes.  Additionally, no variants of uncertain significance (VUSs) were found.  The test report will be scanned into EPIC and will be located under the Results Review tab in the Molecular Pathology section.   We discussed with Ms. Santizo that since the current genetic testing is not perfect, it is possible there may be a gene mutation in one of these genes that current testing cannot detect, but that chance is small. We also discussed, that it is possible that another gene that has not yet been discovered, or that we have not yet tested, is responsible for the cancer  diagnoses in the family, and it is, therefore, important to remain in touch with cancer genetics in the future so that we can continue to offer Ms. Ehresman the most up to date genetic testing.   CANCER SCREENING RECOMMENDATIONS: This result is reassuring and indicates that Ms. Swartz likely does not have an increased risk for a future cancer due to a mutation in one of these genes. This normal test also suggests that Ms. Faiella's cancer was most likely not due to an inherited predisposition associated with one of these genes.  Most cancers happen by chance and this negative test suggests that her cancer falls into this category.  We, therefore, recommended she continue to follow the cancer management and screening guidelines provided by her oncology and primary healthcare providers.   RECOMMENDATIONS FOR FAMILY MEMBERS: Women in this family might be at some increased risk of developing cancer, over the general population risk, simply due to the family history of cancer. We recommended women in this family have a yearly mammogram beginning at age 39, or 80 years younger than the earliest onset of cancer, an an annual clinical breast exam, and perform monthly breast self-exams. Thus, Ms. Dillow daughter should continue to have annual mammograms.  Women in this family should also have a gynecological exam as recommended by their primary provider. All family members should have a colonoscopy by age 50.  FOLLOW-UP: Lastly, we discussed with Ms. Scheller that cancer genetics is a rapidly advancing field and it is possible that new genetic tests will be appropriate for her and/or her family members in the future. We encouraged her to remain in contact with cancer genetics on an annual basis  so we can update her personal and family histories and let her know of advances in cancer genetics that may benefit this family.   Our contact number was provided. Ms. Dewing questions were answered to her satisfaction, and  she knows she is welcome to call us at anytime with additional questions or concerns.   Jeanine Luz MS Genetic Counselor [email or web address] Phone: [phone]

## 2015-06-25 ENCOUNTER — Other Ambulatory Visit: Payer: Self-pay | Admitting: Internal Medicine

## 2015-07-12 ENCOUNTER — Encounter (HOSPITAL_COMMUNITY): Payer: Self-pay

## 2015-07-15 ENCOUNTER — Ambulatory Visit (INDEPENDENT_AMBULATORY_CARE_PROVIDER_SITE_OTHER): Payer: Medicare Other | Admitting: Family Medicine

## 2015-07-15 ENCOUNTER — Encounter: Payer: Self-pay | Admitting: Family Medicine

## 2015-07-15 VITALS — BP 110/68 | HR 82 | Temp 98.1°F | Resp 16 | Wt 185.0 lb

## 2015-07-15 DIAGNOSIS — M1712 Unilateral primary osteoarthritis, left knee: Secondary | ICD-10-CM

## 2015-07-15 DIAGNOSIS — I200 Unstable angina: Secondary | ICD-10-CM | POA: Diagnosis not present

## 2015-07-15 NOTE — Progress Notes (Signed)
Subjective:    Patient ID: [name], female    DOB: 01/11/1946, 69 y.o.   MRN: [id number]  HPI Patient presents with left knee pain. She has osteoarthritis in the left knee and underwent arthroscopic surgery  For the same diagnosis in 2006. She received sterilely injections in her left knee periodically over the last 10 years due to pain. The pain is completely the same as it has been over the last 10 years. It is worse going up or coming down steps. It is worse with walking. She also has a mild knee effusion. There is no erythema or warmth around the knee. She denies any falls or injuries recently. She denies any night sweats or bone pain anywhere else in the body Past Medical History  Diagnosis Date  . Cancer [facility])     breast CA  . Asthma   . GERD (gastroesophageal reflux disease)   . DM2 (diabetes mellitus, type 2) ([place])   . Arthritis   . Allergy history unknown   . Hiatal hernia   . Leg cramps   . Hypertension   . Elevated cholesterol   . NICM (nonischemic cardiomyopathy) ([place])     a. Echo:  06/14/12: Poor endocardial definition, possible septal HK, EF 50-55%, normal wall motion, mild LAE ;  b.  [facility] 9/13:  normal cors, EF 35-40%,  c. Echo 7/14: EF 50-55%, mild LAE  . LBBB (left bundle branch block) 10/27/2012  . Breast cancer, left breast ([place]) 10/27/2012    3.2 cm ER positive, Her-2 negative, 1 node positive S/P mastectomy/TTRAM reconstruction 4/08  . Breast cancer, right breast ([place]) 10/27/2012    3.2 cm ER/PR positive, 1 node positive, Her-2 negative Rx w mastectomy, AC -T chemo, followed by aromatase inhibitor x 5 years dx 4/08  . Diabetes ([place])   . Other fatigue 01/01/2015  . Breast cancer ([place]) 1999; 2008    hx of bilateral breast cancer   Past Surgical History  Procedure Laterality Date  . Mastectomy      bilateral  . Knee surgery      left  . Dilation and curettage of uterus    . Left heart catheterization with coronary angiogram N/A 06/15/2012    Procedure: LEFT  HEART CATHETERIZATION WITH CORONARY ANGIOGRAM;  Surgeon: Wellington Hampshire, MD;  Location: [place] CATH LAB;  Service: Cardiovascular;  Laterality: N/A;   Current Outpatient Prescriptions on File Prior to Visit  Medication Sig Dispense Refill  . apixaban (ELIQUIS) 5 MG TABS tablet Take 1 tablet (5 mg total) by mouth 2 (two) times daily. 180 tablet 3  . atorvastatin (LIPITOR) 20 MG tablet TAKE 2 TABLETS (40 MG TOTAL) BY MOUTH EVERY EVENING. 60 tablet 2  . Blood Glucose Monitoring Suppl (ONE TOUCH ULTRA 2) W/DEVICE KIT Check FBS qam - Dx:250.00 and she will need lancets (1 box) , strips (1 bottle = 100) and controls also thanks 1 each 0  . Calcium Carbonate-Vitamin D (CALCIUM-VITAMIN D) 500-200 MG-UNIT per tablet Take 1 tablet by mouth 2 (two) times daily with a meal.     . carvedilol (COREG) 12.5 MG tablet TAKE 1 TWICE A DAY . TAKE AN EXTRA 1/2 TAB FOR SUSTAINED HEART RATE OVER 120 UP TO TWICE DAILY 210 tablet 0  . cetirizine (ZYRTEC) 10 MG tablet Take 10 mg by mouth every evening.    Marland Kitchen CINNAMON PO Take 1 tablet by mouth daily.      . Coenzyme Q10 (CO Q 10 PO) Take 1  capsule by mouth daily.      . cyanocobalamin 500 MCG tablet Take 500 mcg by mouth daily. 10/25/12--Pt not sure of dose    . DEXILANT 60 MG capsule TAKE 1 CAPSULE (60 MG TOTAL) BY MOUTH DAILY. 90 capsule 3  . diclofenac (VOLTAREN) 75 MG EC tablet Take 1 tablet (75 mg total) by mouth 2 (two) times daily. 60 tablet 0  . fluticasone (FLONASE) 50 MCG/ACT nasal spray INHALE 2 SPRAYS IN EACH NOSTRIL DAILY FOR 3 MONTHS 48 g 1  . furosemide (LASIX) 20 MG tablet Take 1 tablet (20 mg total) by mouth daily. 90 tablet 3  . Glucosamine-Chondroitin (GLUCOSAMINE CHONDR COMPLEX PO) Take 1 tablet by mouth daily.      . Multiple Vitamins-Minerals (MULTIVITAMINS THER. W/MINERALS) TABS Take 1 tablet by mouth daily.      . nitroGLYCERIN (NITROSTAT) 0.4 MG SL tablet Place 1 tablet (0.4 mg total) under the tongue every 5 (five) minutes as needed for chest pain. 25  tablet prn  . OVER THE COUNTER MEDICATION Take 1 tablet by mouth daily. Leg cramps    . Probiotic Product (RESTORA) CAPS 1 cap po qd 30 capsule 11  . spironolactone (ALDACTONE) 25 MG tablet Take 1 tablet (25 mg total) by mouth daily. 90 tablet 3  . SYMBICORT 160-4.5 MCG/ACT inhaler Inhale 2 puffs into the lungs as needed. For wheezing  3  . valsartan (DIOVAN) 160 MG tablet TAKE 1 AND 1/2 TABLETS (TOTAL 240MG) DAILY 135 tablet 3  . vitamin C (ASCORBIC ACID) 500 MG tablet Take 500 mg by mouth 2 (two) times daily.      . WELCHOL 625 MG tablet CMD FOR 90D TAKE 3 TABLETS (1,875 MG TOTAL) BY MOUTH 2 (TWO) TIMES DAILY WITH A MEAL. 270 tablet 3  . zafirlukast (ACCOLATE) 20 MG tablet TAKE 1 TABLET (20 MG TOTAL) BY MOUTH 2 (TWO) TIMES DAILY. 180 tablet 1   No current facility-administered medications on file prior to visit.   Allergies  Allergen Reactions  . Adhesive [Tape] Other (See Comments)    Skin turns red   . Diphenhydramine Other (See Comments)    Pt feels wired   . Vicodin [Hydrocodone-Acetaminophen] Nausea And Vomiting   Social History   Social History  . Marital Status: Married    Spouse Name: N/A  . Number of Children: 2  . Years of Education: N/A   Occupational History  . retired     post Furniture conservator/restorer   Social History Main Topics  . Smoking status: Never Smoker   . Smokeless tobacco: Never Used  . Alcohol Use: No  . Drug Use: No  . Sexual Activity: No   Other Topics Concern  . Not on file   Social History Narrative      Review of Systems  All other systems reviewed and are negative.      Objective:   Physical Exam  Constitutional: She appears well-developed and well-nourished.  Cardiovascular: Normal rate.   Pulmonary/Chest: Effort normal and breath sounds normal. No respiratory distress. She has no wheezes.  Musculoskeletal:       Left knee: She exhibits decreased range of motion, swelling and effusion. She exhibits no LCL laxity, normal  meniscus and no MCL laxity. Tenderness found. Medial joint line and lateral joint line tenderness noted. No MCL and no LCL tenderness noted.  Vitals reviewed.         Assessment & Plan:  Primary osteoarthritis of left knee  Patient has osteoarthritis in  the left knee. Using sterile technique, I injected the left knee with a mixture of 2 mL of lidocaine, 2 mL of Marcaine, and 2 mL of 40 mg per mL Kenalog. The patient tolerated the procedure well with no complications. Recheck in one week if no better or sooner if worse.

## 2015-07-18 DIAGNOSIS — Z23 Encounter for immunization: Secondary | ICD-10-CM | POA: Diagnosis not present

## 2015-09-03 ENCOUNTER — Ambulatory Visit (INDEPENDENT_AMBULATORY_CARE_PROVIDER_SITE_OTHER): Payer: Medicare Other | Admitting: Family Medicine

## 2015-09-03 ENCOUNTER — Encounter: Payer: Self-pay | Admitting: Family Medicine

## 2015-09-03 VITALS — BP 90/60 | HR 72 | Temp 98.3°F | Resp 18 | Ht 62.0 in | Wt 182.0 lb

## 2015-09-03 DIAGNOSIS — I200 Unstable angina: Secondary | ICD-10-CM | POA: Diagnosis not present

## 2015-09-03 DIAGNOSIS — R252 Cramp and spasm: Secondary | ICD-10-CM

## 2015-09-03 DIAGNOSIS — E119 Type 2 diabetes mellitus without complications: Secondary | ICD-10-CM

## 2015-09-03 NOTE — Progress Notes (Signed)
Subjective:    Patient ID: [name], female    DOB: 06-Sep-1946, 69 y.o.   MRN: [id number]  HPI  Patient is a very pleasant 69 year old white female who has a history of nonischemic cardiomyopathy as well as type 2 diabetes mellitus. Her most recent echocardiogram revealed an ejection fraction of 35-40%. She is currently on Lasix as well as spironolactone. Over the last several months she has started to experience daily cramps. They typically occur at night. They're unrelated to exercise. They typically occur in her calves and in her thighs. She denies any symptoms of claudication. She is interested in checking her potassium. She has not tried any tonic water with quinine due to the poor taste. She has not tried any magnesium supplement although she is taking some over-the-counter "cramp preventative pills" while she is checking her blood work she would like to have her hemoglobin A1c checked. Past Medical History  Diagnosis Date  . Cancer [facility])     breast CA  . Asthma   . GERD (gastroesophageal reflux disease)   . DM2 (diabetes mellitus, type 2) ([place])   . Arthritis   . Allergy history unknown   . Hiatal hernia   . Leg cramps   . Hypertension   . Elevated cholesterol   . NICM (nonischemic cardiomyopathy) ([place])     a. Echo:  06/14/12: Poor endocardial definition, possible septal HK, EF 50-55%, normal wall motion, mild LAE ;  b.  [facility] 9/13:  normal cors, EF 35-40%,  c. Echo 7/14: EF 50-55%, mild LAE  . LBBB (left bundle branch block) 10/27/2012  . Breast cancer, left breast ([place]) 10/27/2012    3.2 cm ER positive, Her-2 negative, 1 node positive S/P mastectomy/TTRAM reconstruction 4/08  . Breast cancer, right breast ([place]) 10/27/2012    3.2 cm ER/PR positive, 1 node positive, Her-2 negative Rx w mastectomy, AC -T chemo, followed by aromatase inhibitor x 5 years dx 4/08  . Diabetes ([place])   . Other fatigue 01/01/2015  . Breast cancer ([place]) 1999; 2008    hx of bilateral breast cancer   Past  Surgical History  Procedure Laterality Date  . Mastectomy      bilateral  . Knee surgery      left  . Dilation and curettage of uterus    . Left heart catheterization with coronary angiogram N/A 06/15/2012    Procedure: LEFT HEART CATHETERIZATION WITH CORONARY ANGIOGRAM;  Surgeon: Wellington Hampshire, MD;  Location: [place] CATH LAB;  Service: Cardiovascular;  Laterality: N/A;   Current Outpatient Prescriptions on File Prior to Visit  Medication Sig Dispense Refill  . apixaban (ELIQUIS) 5 MG TABS tablet Take 1 tablet (5 mg total) by mouth 2 (two) times daily. 180 tablet 3  . atorvastatin (LIPITOR) 20 MG tablet TAKE 2 TABLETS (40 MG TOTAL) BY MOUTH EVERY EVENING. 60 tablet 2  . Blood Glucose Monitoring Suppl (ONE TOUCH ULTRA 2) W/DEVICE KIT Check FBS qam - Dx:250.00 and she will need lancets (1 box) , strips (1 bottle = 100) and controls also thanks 1 each 0  . Calcium Carbonate-Vitamin D (CALCIUM-VITAMIN D) 500-200 MG-UNIT per tablet Take 1 tablet by mouth 2 (two) times daily with a meal.     . carvedilol (COREG) 12.5 MG tablet TAKE 1 TWICE A DAY . TAKE AN EXTRA 1/2 TAB FOR SUSTAINED HEART RATE OVER 120 UP TO TWICE DAILY 210 tablet 0  . cetirizine (ZYRTEC) 10 MG tablet Take 10 mg by mouth every evening.    Marland Kitchen  CINNAMON PO Take 1 tablet by mouth daily.      . Coenzyme Q10 (CO Q 10 PO) Take 1 capsule by mouth daily.      . cyanocobalamin 500 MCG tablet Take 500 mcg by mouth daily. 10/25/12--Pt not sure of dose    . DEXILANT 60 MG capsule TAKE 1 CAPSULE (60 MG TOTAL) BY MOUTH DAILY. 90 capsule 3  . diclofenac (VOLTAREN) 75 MG EC tablet Take 1 tablet (75 mg total) by mouth 2 (two) times daily. 60 tablet 0  . fluticasone (FLONASE) 50 MCG/ACT nasal spray INHALE 2 SPRAYS IN EACH NOSTRIL DAILY FOR 3 MONTHS 48 g 1  . furosemide (LASIX) 20 MG tablet Take 1 tablet (20 mg total) by mouth daily. 90 tablet 3  . Glucosamine-Chondroitin (GLUCOSAMINE CHONDR COMPLEX PO) Take 1 tablet by mouth daily.      . Multiple  Vitamins-Minerals (MULTIVITAMINS THER. W/MINERALS) TABS Take 1 tablet by mouth daily.      . nitroGLYCERIN (NITROSTAT) 0.4 MG SL tablet Place 1 tablet (0.4 mg total) under the tongue every 5 (five) minutes as needed for chest pain. 25 tablet prn  . OVER THE COUNTER MEDICATION Take 1 tablet by mouth daily. Leg cramps    . Probiotic Product (RESTORA) CAPS 1 cap po qd 30 capsule 11  . spironolactone (ALDACTONE) 25 MG tablet Take 1 tablet (25 mg total) by mouth daily. 90 tablet 3  . SYMBICORT 160-4.5 MCG/ACT inhaler Inhale 2 puffs into the lungs as needed. For wheezing  3  . valsartan (DIOVAN) 160 MG tablet TAKE 1 AND 1/2 TABLETS (TOTAL 240MG) DAILY 135 tablet 3  . vitamin C (ASCORBIC ACID) 500 MG tablet Take 500 mg by mouth 2 (two) times daily.      . WELCHOL 625 MG tablet CMD FOR 90D TAKE 3 TABLETS (1,875 MG TOTAL) BY MOUTH 2 (TWO) TIMES DAILY WITH A MEAL. 270 tablet 3  . zafirlukast (ACCOLATE) 20 MG tablet TAKE 1 TABLET (20 MG TOTAL) BY MOUTH 2 (TWO) TIMES DAILY. 180 tablet 1   No current facility-administered medications on file prior to visit.   Allergies  Allergen Reactions  . Adhesive [Tape] Other (See Comments)    Skin turns red   . Diphenhydramine Other (See Comments)    Pt feels wired   . Vicodin [Hydrocodone-Acetaminophen] Nausea And Vomiting   Social History   Social History  . Marital Status: Married    Spouse Name: N/A  . Number of Children: 2  . Years of Education: N/A   Occupational History  . retired     post Furniture conservator/restorer   Social History Main Topics  . Smoking status: Never Smoker   . Smokeless tobacco: Never Used  . Alcohol Use: No  . Drug Use: No  . Sexual Activity: No   Other Topics Concern  . Not on file   Social History Narrative     Review of Systems  All other systems reviewed and are negative.      Objective:   Physical Exam  Cardiovascular: Normal rate, regular rhythm, normal heart sounds and intact distal pulses.     Pulmonary/Chest: Effort normal and breath sounds normal. No respiratory distress. She has no wheezes. She has no rales.  Abdominal: Soft. Bowel sounds are normal.  Musculoskeletal: She exhibits no edema.  Vitals reviewed.         Assessment & Plan:  Nocturnal foot cramps - Plan: COMPLETE METABOLIC PANEL WITH GFR, Magnesium  Controlled type 2 diabetes mellitus  without complication, unspecified long term insulin use status ([place]) - Plan: COMPLETE METABOLIC PANEL WITH GFR, Magnesium, Hemoglobin A1c  I will check the patient's potassium, calcium, and magnesium assuming that her electrolytes are normal, I recommended trying tonic water with quinine over-the-counter. The patient states that she cannot tolerate the taste of that. She would be interested in trying hydroxychloroquine as a preventative to stop the nocturnal cramps given her severity. I will also check a hemoglobin A1c while the patient is drawing blood work. She denies any hypoglycemic episodes, polyuria, polydipsia, or blurred vision. She does occasionally still have episodes of diarrhea. GI workup has been negative. I have recommended trying Pepto-Bismol on an as-needed basis as Imodium is too strong for this patient.

## 2015-09-04 LAB — COMPLETE METABOLIC PANEL WITH GFR
ALT: 30 U/L — AB (ref 6–29)
AST: 17 U/L (ref 10–35)
Albumin: 4 g/dL (ref 3.6–5.1)
Alkaline Phosphatase: 67 U/L (ref 33–130)
BUN: 19 mg/dL (ref 7–25)
CALCIUM: 9.2 mg/dL (ref 8.6–10.4)
CO2: 26 mmol/L (ref 20–31)
CREATININE: 0.84 mg/dL (ref 0.60–0.93)
Chloride: 104 mmol/L (ref 98–110)
GFR, Est African American: 81 mL/min (ref >=60)
GFR, Est Non African American: 70 mL/min (ref >=60)
Glucose, Bld: 127 mg/dL — ABNORMAL HIGH (ref 70–99)
Potassium: 4.4 mmol/L (ref 3.5–5.3)
SODIUM: 137 mmol/L (ref 135–146)
Total Bilirubin: 0.3 mg/dL (ref 0.2–1.2)
Total Protein: 6.5 g/dL (ref 6.1–8.1)

## 2015-09-04 LAB — HEMOGLOBIN A1C
Hgb A1c MFr Bld: 6.9 % — ABNORMAL HIGH (ref <5.7)
Mean Plasma Glucose: 151 mg/dL — ABNORMAL HIGH (ref <117)

## 2015-09-04 LAB — MAGNESIUM: MAGNESIUM: 1.9 mg/dL (ref 1.5–2.5)

## 2015-09-06 ENCOUNTER — Encounter: Payer: Self-pay | Admitting: Family Medicine

## 2015-09-11 ENCOUNTER — Other Ambulatory Visit: Payer: Self-pay | Admitting: Cardiology

## 2015-09-21 ENCOUNTER — Other Ambulatory Visit: Payer: Self-pay | Admitting: Internal Medicine

## 2015-10-01 ENCOUNTER — Other Ambulatory Visit: Payer: Self-pay | Admitting: *Deleted

## 2015-10-01 DIAGNOSIS — E785 Hyperlipidemia, unspecified: Secondary | ICD-10-CM

## 2015-10-01 DIAGNOSIS — I428 Other cardiomyopathies: Secondary | ICD-10-CM

## 2015-10-01 DIAGNOSIS — R0602 Shortness of breath: Secondary | ICD-10-CM

## 2015-10-01 MED ORDER — FUROSEMIDE 20 MG PO TABS: 20.0000 mg | ORAL_TABLET | Freq: Every day | ORAL | Status: DC

## 2015-10-09 DIAGNOSIS — L509 Urticaria, unspecified: Secondary | ICD-10-CM | POA: Diagnosis not present

## 2015-10-11 ENCOUNTER — Telehealth: Payer: Self-pay | Admitting: Family Medicine

## 2015-10-11 MED ORDER — GLUCOSE BLOOD VI STRP: ORAL_STRIP | Status: DC

## 2015-10-11 NOTE — Telephone Encounter (Signed)
Patient called requesting a refill on her test strips accu check comfort curve she uses [facility] [address]

## 2015-10-11 NOTE — Telephone Encounter (Signed)
Medication called/sent to requested pharmacy  

## 2015-10-22 DIAGNOSIS — B079 Viral wart, unspecified: Secondary | ICD-10-CM | POA: Diagnosis not present

## 2015-10-22 DIAGNOSIS — L918 Other hypertrophic disorders of the skin: Secondary | ICD-10-CM | POA: Diagnosis not present

## 2015-10-22 DIAGNOSIS — Z1283 Encounter for screening for malignant neoplasm of skin: Secondary | ICD-10-CM | POA: Diagnosis not present

## 2015-10-28 ENCOUNTER — Telehealth (HOSPITAL_COMMUNITY): Payer: Self-pay | Admitting: Vascular Surgery

## 2015-10-28 NOTE — Telephone Encounter (Signed)
Pt called she is out of breath, she is taking a medication from a dermatologist she believes its making her heart beat fast and cause SOB, she states she feels uncomfortable and she would like to see someone today.. Please advise

## 2015-10-28 NOTE — Telephone Encounter (Signed)
Patient called with concerns regarding increased SOB Patient reports SOB increased after taking 2 doses of Vistaril, Prednisone, and Tagamet ordered by dermatology  Pt admits to increased weight b/c she sits around all day (does not check weight daily) and SOB Denies swelling

## 2015-10-28 NOTE — Telephone Encounter (Signed)
Spoke w/pt, appt sch for 1/24 w/PA

## 2015-10-28 NOTE — Telephone Encounter (Signed)
Arrange for followup in office with me or PA/NP this week.

## 2015-10-29 ENCOUNTER — Ambulatory Visit (HOSPITAL_COMMUNITY)
Admission: RE | Admit: 2015-10-29 | Discharge: 2015-10-29 | Disposition: A | Discharge status: Home / Self Care | Payer: Medicare Other | Source: Ambulatory Visit | Attending: Internal Medicine | Admitting: Internal Medicine

## 2015-10-29 ENCOUNTER — Encounter (HOSPITAL_COMMUNITY): Payer: Self-pay

## 2015-10-29 VITALS — BP 114/64 | HR 96 | Wt 182.0 lb

## 2015-10-29 DIAGNOSIS — J45909 Unspecified asthma, uncomplicated: Secondary | ICD-10-CM | POA: Diagnosis not present

## 2015-10-29 DIAGNOSIS — Z9013 Acquired absence of bilateral breasts and nipples: Secondary | ICD-10-CM | POA: Insufficient documentation

## 2015-10-29 DIAGNOSIS — I429 Cardiomyopathy, unspecified: Secondary | ICD-10-CM

## 2015-10-29 DIAGNOSIS — I10 Essential (primary) hypertension: Secondary | ICD-10-CM | POA: Insufficient documentation

## 2015-10-29 DIAGNOSIS — Z86711 Personal history of pulmonary embolism: Secondary | ICD-10-CM | POA: Diagnosis not present

## 2015-10-29 DIAGNOSIS — E785 Hyperlipidemia, unspecified: Secondary | ICD-10-CM | POA: Diagnosis not present

## 2015-10-29 DIAGNOSIS — R0789 Other chest pain: Secondary | ICD-10-CM | POA: Insufficient documentation

## 2015-10-29 DIAGNOSIS — E119 Type 2 diabetes mellitus without complications: Secondary | ICD-10-CM | POA: Insufficient documentation

## 2015-10-29 DIAGNOSIS — I200 Unstable angina: Secondary | ICD-10-CM | POA: Diagnosis not present

## 2015-10-29 DIAGNOSIS — Z7902 Long term (current) use of antithrombotics/antiplatelets: Secondary | ICD-10-CM | POA: Diagnosis not present

## 2015-10-29 DIAGNOSIS — Z853 Personal history of malignant neoplasm of breast: Secondary | ICD-10-CM | POA: Insufficient documentation

## 2015-10-29 DIAGNOSIS — I480 Paroxysmal atrial fibrillation: Secondary | ICD-10-CM | POA: Insufficient documentation

## 2015-10-29 DIAGNOSIS — I428 Other cardiomyopathies: Secondary | ICD-10-CM | POA: Diagnosis not present

## 2015-10-29 DIAGNOSIS — Z79899 Other long term (current) drug therapy: Secondary | ICD-10-CM | POA: Diagnosis not present

## 2015-10-29 DIAGNOSIS — K219 Gastro-esophageal reflux disease without esophagitis: Secondary | ICD-10-CM | POA: Diagnosis not present

## 2015-10-29 LAB — CBC
HCT: 38.6 % (ref 36.0–46.0)
Hemoglobin: 13.1 g/dL (ref 12.0–15.0)
MCH: 32.3 pg (ref 26.0–34.0)
MCHC: 33.9 g/dL (ref 30.0–36.0)
MCV: 95.3 fL (ref 78.0–100.0)
PLATELETS: 278 10*3/uL (ref 150–400)
RBC: 4.05 MIL/uL (ref 3.87–5.11)
RDW: 12.6 % (ref 11.5–15.5)
WBC: 6.1 10*3/uL (ref 4.0–10.5)

## 2015-10-29 LAB — BASIC METABOLIC PANEL
Anion gap: 13 (ref 5–15)
BUN: 21 mg/dL — AB (ref 6–20)
CALCIUM: 9.6 mg/dL (ref 8.9–10.3)
CO2: 25 mmol/L (ref 22–32)
Chloride: 103 mmol/L (ref 101–111)
Creatinine, Ser: 1.06 mg/dL — ABNORMAL HIGH (ref 0.44–1.00)
GFR calc Af Amer: 60 mL/min — ABNORMAL LOW (ref >60)
GFR, EST NON AFRICAN AMERICAN: 52 mL/min — AB (ref >60)
GLUCOSE: 143 mg/dL — AB (ref 65–99)
Potassium: 4.6 mmol/L (ref 3.5–5.1)
Sodium: 141 mmol/L (ref 135–145)

## 2015-10-29 LAB — BRAIN NATRIURETIC PEPTIDE: B Natriuretic Peptide: 22.3 pg/mL (ref 0.0–100.0)

## 2015-10-29 NOTE — Progress Notes (Signed)
Advanced Heart Failure Medication Review by a Pharmacist  Does the patient  feel that his/her medications are working for him/her?  yes  Has the patient been experiencing any side effects to the medications prescribed?  no  Does the patient measure his/her own blood pressure or blood glucose at home?  no   Does the patient have any problems obtaining medications due to transportation or finances?   no  Understanding of regimen: fair Understanding of indications: fair Potential of compliance: good Patient understands to avoid NSAIDs. Patient understands to avoid decongestants.  Issues to address at subsequent visits: None   Pharmacist comments:  [name] is a pleasant 70 yo F presenting without a medication list and unable to accurately verbalize her regimen to me. She does report good compliance with her regimen and states that her husband manages her medications for her. She was unsure if she was actually taking diclofenac scheduled but we did discuss the cardiovascular risks associated with this class of medication.   [name]. [name], PharmD, BCPS, CPP Clinical Pharmacist Pager: [phone] Phone: [phone] 10/29/2015 10:00 AM      Time with patient: 14 minutes Preparation and documentation time: 2 minutes Total time: 16 minutes

## 2015-10-29 NOTE — Patient Instructions (Signed)
Labs today  Your physician has requested that you have an echocardiogram. Echocardiography is a painless test that uses sound waves to create images of your heart. It provides your doctor with information about the size and shape of your heart and how well your heart's chambers and valves are working. This procedure takes approximately one hour. There are no restrictions for this procedure.  Your physician recommends that you schedule a follow-up appointment in: next available with Dr.McLean

## 2015-10-29 NOTE — Progress Notes (Signed)
Patient ID: [name], female   DOB: [birth year], 70 y.o.   MRN: [id number]    Advanced Heart Failure Clinic Note   PCP: Dr. Dennard Schaumann  70 yo with history of HTN and type II diabetes presents for evaluation of nonischemic cardiomyopathy.  Back in 2009, she had an echo with normal EF.  She was admitted in 9/13 with chest pain and LBBB.  She had LHC showing no CAD but EF 35-40%.  Echo was a technically difficult study with EF reported as 50-55%.  She has been taking ARB and Coreg.  Repeat echo in 12/13 showed EF 40% with septal hypokinesis.  Cardiac MRI was done in 12/13 as well, with calculated EF 50%, global hypokinesis, and no delayed enhancement.  Echo in 7/15 showed EF 55-60% with mild MR and mild TR (no LBBB at this time).   In 3/16, she began to develop increased dyspnea.  She was seen by Truitt Merle and was noted to have a LBBB again. Echo in 3/16 showed EF down to 35-40%.  She was then seen in followup by Rosaria Ferries.  Alivecor on her phone showed a period of HR up to 160s that appeared to be atrial fibrillation.  She felt tachypalpitations. Coreg was increased and she was started on apixaban 5 mg bid.  At last appointment, I started her on a low dose of Lasix.  CPX in 4/16 showed a submaximal effort but normal spirometry and normal peak VO2.  She did have ST changes possibly concerning for ischemia (but has baseline LBBB so hard to interpret).    Had stress test 02/20/15 with "probably no ischemia or infarction. EF has also improved.   She returns today for add-on with SOB/Chest pressure after starting Vistaril, Prednisone, and Tagamet per her dermatologist. Weight is stable from her last visit. Has had periodic "chest discomfort" since about January 5th, and periodically before that. No clear aggravating or relieving factors, apart from the medicines listed above. Has taken extra coreg x 3 times this month for palpitations.  Does have what sounds like some bendopnea. No DOE on flat ground,  but does have SOB with inclines. Avoids stairs with her arthritis. No melena or BRBPR.  She is not sure if anxiety has been adding to her symptoms.  No recent big events. Tries to avoid stressful situations. She does state that at one time her discomfort worsened while carrying a large box. Seemed almost muscular at that time.   ECG: NSR 84, LBBB stable from previous.  Labs (9/13): TSH 4.884 (elevated), proBNP 155 Labs (10/13): K 3.6, creatinine 0.8 Labs (1/14): K 3.7, creatinine 0.8, BNP not elevated, ANA weakly positive, TSH normal Labs (7/14): BNP normal Labs (11/14): K 4.1, creatinine 0.73 Labs (10/15): K 4.2, creatinine 0.73, LDL 45 Labs (3/16): BNP 46, HCT 42.2, TSH normal Labs (4/16): K 4.1, creatinine 0.78 => 0.8, HCT 39.3  PMH: 1. LBBB/IVCD: Now persistent.   2. Breast cancer s/p bilateral mastectomy. 3. GERD 4. Type II diabetes mellitus 5. HTN 6. Asthma 7. PE in 2001 8. Hyperlipidemia 9. Nonischemic Cardiomyopathy: Echo 8/09 with 60%.  Admitted in 9/13 with chest pain.  LHC showed no angiographic CAD and EF 35-40%.  Echo at that time was read as showing EF 50-55% but was a technically difficult study.  Echo (12/13): EF 40% with septal hypokinesis, normal RV size and systolic function, no significant valvular abnormalities.  Cardiac MRI (12/13): EF 50%, global hypokinesis, no delayed enhancement.  Echo (7/14) with EF  50-55%, mild LV dilation, mild LAE.  Echo (7/15) with EF 55-60%, mild MR and mild TR.  Echo (3/16) with EF 35-40%, septal-lateral dyssynchrony.  CPX (4/16) with RER 0.98 (submaximal), peak VO2 19.5, VE/VCO2 slope 29.4 => normal spirometry, normal peak VO2, but ST changes suggest possibility of ischemia.  10. Supraclavicular lymphadenopathy: Stable by CT back to 2009, suspect benign.  11. Atrial fibrillation: Paroxysmal.  Holter (4/16) with rare PACs and PVCs, short run 5 beats SVT.   SH: Married, lives in [facility], never smoked.  FH: Uncle with ? SCD.   Brother with ? SCD.    Current Outpatient Prescriptions  Medication Sig Dispense Refill  . apixaban (ELIQUIS) 5 MG TABS tablet Take 1 tablet (5 mg total) by mouth 2 (two) times daily. 180 tablet 3  . atorvastatin (LIPITOR) 20 MG tablet TAKE 2 TABLETS (40 MG TOTAL) BY MOUTH EVERY EVENING. 60 tablet 2  . Blood Glucose Monitoring Suppl (ONE TOUCH ULTRA 2) W/DEVICE KIT Check FBS qam - Dx:250.00 and she will need lancets (1 box) , strips (1 bottle = 100) and controls also thanks 1 each 0  . Calcium Carbonate-Vitamin D (CALCIUM-VITAMIN D) 500-200 MG-UNIT per tablet Take 1 tablet by mouth 2 (two) times daily with a meal.     . carvedilol (COREG) 12.5 MG tablet TAKE 1 TWICE A DAY . TAKE AN EXTRA 1/2 TAB FOR SUSTAINED HEART RATE OVER 120 UP TO TWICE DAILY 210 tablet 0  . cetirizine (ZYRTEC) 10 MG tablet Take 10 mg by mouth every evening.    Marland Kitchen CINNAMON PO Take 1 tablet by mouth daily.      . Coenzyme Q10 (CO Q 10 PO) Take 1 capsule by mouth daily.      . cyanocobalamin 500 MCG tablet Take 500 mcg by mouth daily. 10/25/12--Pt not sure of dose    . DEXILANT 60 MG capsule TAKE 1 CAPSULE (60 MG TOTAL) BY MOUTH DAILY. 90 capsule 3  . diclofenac (VOLTAREN) 75 MG EC tablet Take 1 tablet (75 mg total) by mouth 2 (two) times daily. 60 tablet 0  . fluticasone (FLONASE) 50 MCG/ACT nasal spray INHALE 2 SPRAYS IN EACH NOSTRIL DAILY FOR 3 MONTHS 48 g 1  . furosemide (LASIX) 20 MG tablet Take 1 tablet (20 mg total) by mouth daily. 90 tablet 3  . Glucosamine-Chondroitin (GLUCOSAMINE CHONDR COMPLEX PO) Take 1 tablet by mouth daily.      Marland Kitchen glucose blood test strip accu check comfort curve Checks FBS qam 100 each 5  . Multiple Vitamins-Minerals (MULTIVITAMINS THER. W/MINERALS) TABS Take 1 tablet by mouth daily.      Marland Kitchen OVER THE COUNTER MEDICATION Take 1 tablet by mouth daily. Leg cramps    . Probiotic Product (RESTORA) CAPS Take 1 capsule by mouth daily.    Marland Kitchen spironolactone (ALDACTONE) 25 MG tablet Take 1 tablet (25 mg  total) by mouth daily. 90 tablet 3  . valsartan (DIOVAN) 160 MG tablet TAKE 1 AND 1/2 TABLETS (TOTAL 240MG) DAILY 135 tablet 3  . vitamin C (ASCORBIC ACID) 500 MG tablet Take 500 mg by mouth 2 (two) times daily.      . WELCHOL 625 MG tablet CMD FOR 90D TAKE 3 TABLETS (1,875 MG TOTAL) BY MOUTH 2 (TWO) TIMES DAILY WITH A MEAL. 270 tablet 3  . zafirlukast (ACCOLATE) 20 MG tablet TAKE 1 TABLET (20 MG TOTAL) BY MOUTH 2 (TWO) TIMES DAILY. 180 tablet 1  . nitroGLYCERIN (NITROSTAT) 0.4 MG SL tablet Place 1  tablet (0.4 mg total) under the tongue every 5 (five) minutes as needed for chest pain. (Patient not taking: Reported on 10/29/2015) 25 tablet prn   No current facility-administered medications for this encounter.    BP 114/64 mmHg  Pulse 96  Wt 182 lb (82.555 kg)  SpO2 98%   Wt Readings from Last 3 Encounters:  10/29/15 182 lb (82.555 kg)  09/03/15 182 lb (82.555 kg)  07/15/15 185 lb (83.915 kg)    General: NAD Neck: JVD flat, no thyromegaly or nodule noted. Lungs: CTAB, normal effort CV: Nondisplaced PMI.  Regular S1/S2 with paradoxical S2 split, no S3/S4, no murmur appreciated.  No carotid bruit.  Normal pedal pulses. No chest wall tenderness. Abdomen: Soft, NT, ND, no HSM. No bruits or masses. +BS  Neurologic: Alert and oriented x 3.  Psych: Normal affect. Extremities: No clubbing or cyanosis. No edema  Assessment/Plan: 1. NICM: NYHA class II-III symptoms (dyspnea with relatively heavy exertion). Volume status stable on exam.    No CAD on cath 9/13 but EF 35-40% at that time.  ECG showed LBBB.  LBBB resolved and EF normalized.  However, more recently, patient has had some fatigue and dyspnea => repeat echo with EF back down to 35-40%. This may be a LBBB cardiomyopathy.   - CPX in 4/16 was submaximal but showed normal spirometry an actually near normal peak VO2.   - Most recent EF 58% by Cardiolite 02/20/15  - Continue Coreg 12.5 mg BID with extra 6.25 mg for palpitations.  - Continue  valsartan 240 mg daily. - Continue spironolactone 25 mg daily - Volume status looks stable on exam. Continue lasix 20 mg daily. - Lexiscan Cardiolite 02/2015 showed improved EF.   - Repeat echo due. (due in 06/2015) Will get echo and then see back in next few weeks with Echo for Dr Aundra Dubin.  2. Hyperlipidemia:  - Continue atorvastatin 40 mg qhs. 3. Atrial fibrillation: Paroxysmal.   - Having tachy palpitations, though no a-fib on EKG.  Dr. Aundra Dubin has been unsure about this diagnosis in the past.   - Holter monitor in 4/16 showed a short runs SVT (5 beats).  Continue Eliquis 5 mg bid.  4. LBBB:  - Persistent, and may be contributing to cardiomyopathy.  - EKG stable from previous.  5. Atypical Chest pressure - Has occasional tachy palpitations, but by her device report in 90s at that time. Taking extra coreg does seem to help. She thinks it could even be muscular in nature, with slight worsening when carrying a "heavy box". No other aggravating/relieving factors.  - No clear concern for acs at this time.  Will get labs today and repeat Echo. - Pt knows to call with changing/worsening symptoms, and to go to ER with chest pain, especially any related to activity.   Echo scheduled for 11/07/15. She will then follow up with Dr Aundra Dubin on 11/20/15. She knows to call with any changes to her symptoms, or call 911 or go to the ER with chest pain on exertion.   Historical documentation reviewed to assist in the decision making process of this visit includes: Previous visits, diagnostic studies, labwork, and medication reconciliation.   Satira Mccallum Lashanta Elbe PA-C 10/29/2015

## 2015-11-07 ENCOUNTER — Ambulatory Visit (HOSPITAL_COMMUNITY): Payer: Medicare Other | Attending: Cardiovascular Disease

## 2015-11-07 ENCOUNTER — Other Ambulatory Visit: Payer: Self-pay

## 2015-11-07 DIAGNOSIS — I480 Paroxysmal atrial fibrillation: Secondary | ICD-10-CM | POA: Diagnosis not present

## 2015-11-07 DIAGNOSIS — I509 Heart failure, unspecified: Secondary | ICD-10-CM | POA: Diagnosis not present

## 2015-11-07 DIAGNOSIS — I071 Rheumatic tricuspid insufficiency: Secondary | ICD-10-CM | POA: Insufficient documentation

## 2015-11-07 DIAGNOSIS — I5189 Other ill-defined heart diseases: Secondary | ICD-10-CM | POA: Insufficient documentation

## 2015-11-07 DIAGNOSIS — E785 Hyperlipidemia, unspecified: Secondary | ICD-10-CM | POA: Diagnosis not present

## 2015-11-07 DIAGNOSIS — I447 Left bundle-branch block, unspecified: Secondary | ICD-10-CM | POA: Diagnosis not present

## 2015-11-07 DIAGNOSIS — I340 Nonrheumatic mitral (valve) insufficiency: Secondary | ICD-10-CM | POA: Insufficient documentation

## 2015-11-07 DIAGNOSIS — I10 Essential (primary) hypertension: Secondary | ICD-10-CM | POA: Insufficient documentation

## 2015-11-07 DIAGNOSIS — E119 Type 2 diabetes mellitus without complications: Secondary | ICD-10-CM | POA: Diagnosis not present

## 2015-11-15 ENCOUNTER — Other Ambulatory Visit: Payer: Self-pay | Admitting: Family Medicine

## 2015-11-20 ENCOUNTER — Ambulatory Visit (HOSPITAL_COMMUNITY)
Admission: RE | Admit: 2015-11-20 | Discharge: 2015-11-20 | Disposition: A | Discharge status: Home / Self Care | Payer: Medicare Other | Source: Ambulatory Visit | Attending: Cardiology | Admitting: Cardiology

## 2015-11-20 VITALS — BP 100/60 | HR 77 | Wt 182.2 lb

## 2015-11-20 DIAGNOSIS — E119 Type 2 diabetes mellitus without complications: Secondary | ICD-10-CM | POA: Insufficient documentation

## 2015-11-20 DIAGNOSIS — Z9013 Acquired absence of bilateral breasts and nipples: Secondary | ICD-10-CM | POA: Insufficient documentation

## 2015-11-20 DIAGNOSIS — I428 Other cardiomyopathies: Secondary | ICD-10-CM | POA: Diagnosis not present

## 2015-11-20 DIAGNOSIS — E785 Hyperlipidemia, unspecified: Secondary | ICD-10-CM | POA: Insufficient documentation

## 2015-11-20 DIAGNOSIS — Z86711 Personal history of pulmonary embolism: Secondary | ICD-10-CM | POA: Diagnosis not present

## 2015-11-20 DIAGNOSIS — I10 Essential (primary) hypertension: Secondary | ICD-10-CM | POA: Diagnosis not present

## 2015-11-20 DIAGNOSIS — I447 Left bundle-branch block, unspecified: Secondary | ICD-10-CM | POA: Diagnosis not present

## 2015-11-20 DIAGNOSIS — Z79899 Other long term (current) drug therapy: Secondary | ICD-10-CM | POA: Insufficient documentation

## 2015-11-20 DIAGNOSIS — Z853 Personal history of malignant neoplasm of breast: Secondary | ICD-10-CM | POA: Diagnosis not present

## 2015-11-20 DIAGNOSIS — I480 Paroxysmal atrial fibrillation: Secondary | ICD-10-CM

## 2015-11-20 DIAGNOSIS — I429 Cardiomyopathy, unspecified: Secondary | ICD-10-CM

## 2015-11-20 DIAGNOSIS — K219 Gastro-esophageal reflux disease without esophagitis: Secondary | ICD-10-CM | POA: Insufficient documentation

## 2015-11-20 DIAGNOSIS — J45909 Unspecified asthma, uncomplicated: Secondary | ICD-10-CM | POA: Diagnosis not present

## 2015-11-20 DIAGNOSIS — Z7902 Long term (current) use of antithrombotics/antiplatelets: Secondary | ICD-10-CM | POA: Diagnosis not present

## 2015-11-20 NOTE — Patient Instructions (Signed)
We will contact you in 6 months to schedule your next appointment.  

## 2015-11-20 NOTE — Progress Notes (Signed)
Patient ID: [name], female   DOB: August 13, 1946, 70 y.o.   MRN: [id number]    Advanced Heart Failure Clinic Note   PCP: Dr. Dennard Schaumann  70 yo with history of HTN and type II diabetes presents for cardiology followup.  Back in 2009, she had an echo with normal EF.  She was admitted in 9/13 with chest pain and LBBB.  She had LHC showing no CAD but EF 35-40%.  Echo was a technically difficult study with EF reported as 50-55%.  She has been taking ARB and Coreg.  Repeat echo in 12/13 showed EF 40% with septal hypokinesis.  Cardiac MRI was done in 12/13 as well, with calculated EF 50%, global hypokinesis, and no delayed enhancement.  Echo in 7/15 showed EF 55-60% with mild MR and mild TR (no LBBB at this time).   In 3/16, she began to develop increased dyspnea.  She was seen by Truitt Merle and was noted to have a LBBB again. Echo in 3/16 showed EF down to 35-40%.  She was then seen in followup by Rosaria Ferries.  Alivecor on her phone showed a period of HR up to 160s that appeared to be atrial fibrillation.  She felt tachypalpitations. Coreg was increased and she was started on apixaban 5 mg bid. CPX in 4/16 showed a submaximal effort but normal spirometry and normal peak VO2.  She did have ST changes possibly concerning for ischemia (but has baseline LBBB so hard to interpret).    Cardiolite in 5/16 showed on ischemia or infarction.  Most recent echo in 2/17 showed EF 45-50%, diffuse hypokinesis, grade I diastolic dysfunction, normal RV size and systolic function.   She seems to be doing well currently.  No tachypalpitations.  She is in NSR today.  No chest pain.  Occasional dry cough. No dyspnea walking on flat ground, mild dyspnea walking up a hill.    Labs (9/13): TSH 4.884 (elevated), proBNP 155 Labs (10/13): K 3.6, creatinine 0.8 Labs (1/14): K 3.7, creatinine 0.8, BNP not elevated, ANA weakly positive, TSH normal Labs (7/14): BNP normal Labs (11/14): K 4.1, creatinine 0.73 Labs (10/15): K  4.2, creatinine 0.73, LDL 45 Labs (3/16): BNP 46, HCT 42.2, TSH normal Labs (4/16): K 4.1, creatinine 0.78 => 0.8, HCT 39.3 Labs (1/17): K 4.6, creatinine 1.06, hgb 13.1, BNP 22  PMH: 1. LBBB/IVCD: Now persistent.   2. Breast cancer s/p bilateral mastectomy. 3. GERD 4. Type II diabetes mellitus 5. HTN 6. Asthma 7. PE in 2001 8. Hyperlipidemia 9. Nonischemic Cardiomyopathy: Possible LBBB cardiomyopathy. Echo 8/09 with 60%.  Admitted in 9/13 with chest pain.  LHC showed no angiographic CAD and EF 35-40%.  Echo at that time was read as showing EF 50-55% but was a technically difficult study.  Echo (12/13): EF 40% with septal hypokinesis, normal RV size and systolic function, no significant valvular abnormalities.  Cardiac MRI (12/13): EF 50%, global hypokinesis, no delayed enhancement.  Echo (7/14) with EF 50-55%, mild LV dilation, mild LAE.  Echo (7/15) with EF 55-60%, mild MR and mild TR.  Echo (3/16) with EF 35-40%, septal-lateral dyssynchrony.  CPX (4/16) with RER 0.98 (submaximal), peak VO2 19.5, VE/VCO2 slope 29.4 => normal spirometry, normal peak VO2, but ST changes suggest possibility of ischemia.  Lexiscan Cardiolite (5/16) with no ischemia/infarction.  Echo (2/17) with EF 45-50%, diffuse hypokinesis, grade I diastolic dysfunction, normal RV size and systolic function.  10. Supraclavicular lymphadenopathy: Stable by CT back to 2009, suspect benign.  11. Atrial  fibrillation: Paroxysmal.  Holter (4/16) with rare PACs and PVCs, short run 5 beats SVT.   SH: Married, lives in [facility], never smoked.  FH: Uncle with ? SCD.  Brother with ? SCD.    Current Outpatient Prescriptions  Medication Sig Dispense Refill  . apixaban (ELIQUIS) 5 MG TABS tablet Take 1 tablet (5 mg total) by mouth 2 (two) times daily. 180 tablet 3  . atorvastatin (LIPITOR) 20 MG tablet TAKE 2 TABLETS (40 MG TOTAL) BY MOUTH EVERY EVENING. 60 tablet 2  . Blood Glucose Monitoring Suppl (ONE TOUCH ULTRA 2)  W/DEVICE KIT Check FBS qam - Dx:250.00 and she will need lancets (1 box) , strips (1 bottle = 100) and controls also thanks 1 each 0  . Calcium Carbonate-Vitamin D (CALCIUM-VITAMIN D) 500-200 MG-UNIT per tablet Take 1 tablet by mouth 2 (two) times daily with a meal.     . carvedilol (COREG) 12.5 MG tablet TAKE 1 TWICE A DAY . TAKE AN EXTRA 1/2 TAB FOR SUSTAINED HEART RATE OVER 120 UP TO TWICE DAILY 210 tablet 0  . cetirizine (ZYRTEC) 10 MG tablet Take 10 mg by mouth every evening.    . cyanocobalamin 500 MCG tablet Take 500 mcg by mouth daily. 10/25/12--Pt not sure of dose    . DEXILANT 60 MG capsule TAKE 1 CAPSULE (60 MG TOTAL) BY MOUTH DAILY. 90 capsule 3  . fluticasone (FLONASE) 50 MCG/ACT nasal spray INHALE 2 SPRAYS IN EACH NOSTRIL DAILY FOR 3 MONTHS 48 g 1  . furosemide (LASIX) 20 MG tablet Take 1 tablet (20 mg total) by mouth daily. 90 tablet 3  . glucose blood test strip accu check comfort curve Checks FBS qam 100 each 5  . nitroGLYCERIN (NITROSTAT) 0.4 MG SL tablet Place 1 tablet (0.4 mg total) under the tongue every 5 (five) minutes as needed for chest pain. 25 tablet prn  . spironolactone (ALDACTONE) 25 MG tablet Take 1 tablet (25 mg total) by mouth daily. 90 tablet 3  . valsartan (DIOVAN) 160 MG tablet TAKE 1 AND 1/2 TABLETS (TOTAL 240MG) DAILY 135 tablet 3  . vitamin C (ASCORBIC ACID) 500 MG tablet Take 500 mg by mouth 2 (two) times daily.      . WELCHOL 625 MG tablet CMD FOR 90D TAKE 3 TABLETS (1,875 MG TOTAL) BY MOUTH 2 (TWO) TIMES DAILY WITH A MEAL. 270 tablet 3  . zafirlukast (ACCOLATE) 20 MG tablet TAKE 1 TABLET (20 MG TOTAL) BY MOUTH 2 (TWO) TIMES DAILY. 180 tablet 4   No current facility-administered medications for this encounter.    BP 100/60 mmHg  Pulse 77  Wt 182 lb 4 oz (82.668 kg)  SpO2 95%   Wt Readings from Last 3 Encounters:  11/20/15 182 lb 4 oz (82.668 kg)  10/29/15 182 lb (82.555 kg)  09/03/15 182 lb (82.555 kg)    General: NAD Neck: JVD flat, no  thyromegaly or nodule noted. Lungs: CTAB, normal effort CV: Nondisplaced PMI.  Regular S1/S2 with paradoxical S2 split, no S3/S4, no murmur appreciated.  No carotid bruit.  Normal pedal pulses. No chest wall tenderness. Abdomen: Soft, NT, ND, no HSM. No bruits or masses. +BS  Neurologic: Alert and oriented x 3.  Psych: Normal affect. Extremities: No clubbing or cyanosis. No edema  Assessment/Plan: 1. Nonischemic cardiomyopathy:  NYHA class II symptoms. Volume status stable on exam.  No CAD on cath 9/13 but EF 35-40% at that time.  ECG showed LBBB.  LBBB resolved and EF normalized.  However, in 3/16, patient had fatigue and dyspnea => repeat echo with EF back down to 35-40%, she now has a persistent LBBB. This may be a LBBB cardiomyopathy.  Most recent echo in 2/17 showed EF 45-50%.  CPX in 4/16 was submaximal but showed normal spirometry an actually near normal peak VO2.   - Continue Coreg 12.5 mg BID with extra 6.25 mg for palpitations.  - Continue valsartan 240 mg daily. - Continue spironolactone 25 mg daily - Volume status looks stable on exam. Continue lasix 20 mg daily. 2. Hyperlipidemia: Continue atorvastatin 40 mg qhs. 3. Atrial fibrillation: Paroxysmal.  No recent palpitations. -Continue Eliquis 5 mg bid.  4. LBBB: Persistent and may be contributing to cardiomyopathy.    Loralie Champagne 11/20/2015

## 2015-12-10 ENCOUNTER — Encounter: Payer: Self-pay | Admitting: *Deleted

## 2015-12-10 ENCOUNTER — Other Ambulatory Visit: Payer: Self-pay | Admitting: Family Medicine

## 2015-12-10 MED ORDER — ATORVASTATIN CALCIUM 20 MG PO TABS: ORAL_TABLET | ORAL | Status: DC

## 2015-12-22 ENCOUNTER — Other Ambulatory Visit: Payer: Self-pay | Admitting: Internal Medicine

## 2016-01-15 DIAGNOSIS — H2511 Age-related nuclear cataract, right eye: Secondary | ICD-10-CM | POA: Diagnosis not present

## 2016-01-15 DIAGNOSIS — H02834 Dermatochalasis of left upper eyelid: Secondary | ICD-10-CM | POA: Diagnosis not present

## 2016-01-15 DIAGNOSIS — H43812 Vitreous degeneration, left eye: Secondary | ICD-10-CM | POA: Diagnosis not present

## 2016-01-15 DIAGNOSIS — H25812 Combined forms of age-related cataract, left eye: Secondary | ICD-10-CM | POA: Diagnosis not present

## 2016-01-15 DIAGNOSIS — E119 Type 2 diabetes mellitus without complications: Secondary | ICD-10-CM | POA: Diagnosis not present

## 2016-01-15 DIAGNOSIS — H02831 Dermatochalasis of right upper eyelid: Secondary | ICD-10-CM | POA: Diagnosis not present

## 2016-01-15 DIAGNOSIS — H1851 Endothelial corneal dystrophy: Secondary | ICD-10-CM | POA: Diagnosis not present

## 2016-01-17 ENCOUNTER — Ambulatory Visit: Payer: Medicare Other | Admitting: Family Medicine

## 2016-01-21 ENCOUNTER — Encounter: Payer: Self-pay | Admitting: Family Medicine

## 2016-01-21 ENCOUNTER — Ambulatory Visit (INDEPENDENT_AMBULATORY_CARE_PROVIDER_SITE_OTHER): Payer: Medicare Other | Admitting: Family Medicine

## 2016-01-21 VITALS — BP 100/70 | HR 68 | Temp 98.0°F | Resp 16 | Ht 62.0 in | Wt 183.0 lb

## 2016-01-21 DIAGNOSIS — M25562 Pain in left knee: Secondary | ICD-10-CM | POA: Diagnosis not present

## 2016-01-21 DIAGNOSIS — I200 Unstable angina: Secondary | ICD-10-CM

## 2016-01-21 NOTE — Progress Notes (Signed)
Subjective:    Patient ID: [name], female    DOB: 1946-01-22, 70 y.o.   MRN: [id number]  HPI 07/15/15 Patient presents with left knee pain. She has osteoarthritis in the left knee and underwent arthroscopic surgery  For the same diagnosis in 2006. She received steroid injections in her left knee periodically over the last 10 years due to pain. The pain is completely the same as it has been over the last 10 years. It is worse going up or coming down steps. It is worse with walking. She also has a mild knee effusion. There is no erythema or warmth around the knee. She denies any falls or injuries recently. She denies any night sweats or bone pain anywhere else in the body.  At that time,my plan was: Patient has osteoarthritis in the left knee. Using sterile technique, I injected the left knee with a mixture of 2 mL of lidocaine, 2 mL of Marcaine, and 2 mL of 40 mg per mL Kenalog. The patient tolerated the procedure well with no complications. Recheck in one week if no better or sooner if worse.  01/21/16 Patient is requesting a repeat injection in her left knee. The previous steroid injection lasted approximately 6 months. The pain just recently returned. She continues to have pain going down steps which is worsened with walking. She also has an effusion in her left knee because of stiffness and pain. Past Medical History  Diagnosis Date  . Cancer [facility])     breast CA  . Asthma   . GERD (gastroesophageal reflux disease)   . DM2 (diabetes mellitus, type 2) ([place])   . Arthritis   . Allergy history unknown   . Hiatal hernia   . Leg cramps   . Hypertension   . Elevated cholesterol   . NICM (nonischemic cardiomyopathy) ([place])     a. Echo:  06/14/12: Poor endocardial definition, possible septal HK, EF 50-55%, normal wall motion, mild LAE ;  b.  [facility] 9/13:  normal cors, EF 35-40%,  c. Echo 7/14: EF 50-55%, mild LAE  . LBBB (left bundle branch block) 10/27/2012  . Breast cancer, left breast ([place])  10/27/2012    3.2 cm ER positive, Her-2 negative, 1 node positive S/P mastectomy/TTRAM reconstruction 4/08  . Breast cancer, right breast ([place]) 10/27/2012    3.2 cm ER/PR positive, 1 node positive, Her-2 negative Rx w mastectomy, AC -T chemo, followed by aromatase inhibitor x 5 years dx 4/08  . Diabetes ([place])   . Other fatigue 01/01/2015  . Breast cancer ([place]) 1999; 2008    hx of bilateral breast cancer   Past Surgical History  Procedure Laterality Date  . Mastectomy      bilateral  . Knee surgery      left  . Dilation and curettage of uterus    . Left heart catheterization with coronary angiogram N/A 06/15/2012    Procedure: LEFT HEART CATHETERIZATION WITH CORONARY ANGIOGRAM;  Surgeon: Wellington Hampshire, MD;  Location: [place] CATH LAB;  Service: Cardiovascular;  Laterality: N/A;   Current Outpatient Prescriptions on File Prior to Visit  Medication Sig Dispense Refill  . apixaban (ELIQUIS) 5 MG TABS tablet Take 1 tablet (5 mg total) by mouth 2 (two) times daily. 180 tablet 3  . atorvastatin (LIPITOR) 20 MG tablet TAKE 2 TABLETS (40 MG TOTAL) BY MOUTH EVERY EVENING. 180 tablet 0  . Blood Glucose Monitoring Suppl (ONE TOUCH ULTRA 2) W/DEVICE KIT Check FBS qam - Dx:250.00 and she  will need lancets (1 box) , strips (1 bottle = 100) and controls also thanks 1 each 0  . Calcium Carbonate-Vitamin D (CALCIUM-VITAMIN D) 500-200 MG-UNIT per tablet Take 1 tablet by mouth 2 (two) times daily with a meal.     . carvedilol (COREG) 12.5 MG tablet TAKE 1 TWICE A DAY . TAKE AN EXTRA 1/2 TAB FOR SUSTAINED HEART RATE OVER 120 UP TO TWICE DAILY 210 tablet 0  . cetirizine (ZYRTEC) 10 MG tablet Take 10 mg by mouth every evening.    . cyanocobalamin 500 MCG tablet Take 500 mcg by mouth daily. 10/25/12--Pt not sure of dose    . DEXILANT 60 MG capsule TAKE 1 CAPSULE (60 MG TOTAL) BY MOUTH DAILY. 90 capsule 3  . fluticasone (FLONASE) 50 MCG/ACT nasal spray INHALE 2 SPRAYS IN EACH NOSTRIL DAILY FOR 3 MONTHS 48 g 1  .  furosemide (LASIX) 20 MG tablet Take 1 tablet (20 mg total) by mouth daily. 90 tablet 3  . glucose blood test strip accu check comfort curve Checks FBS qam 100 each 5  . nitroGLYCERIN (NITROSTAT) 0.4 MG SL tablet Place 1 tablet (0.4 mg total) under the tongue every 5 (five) minutes as needed for chest pain. 25 tablet prn  . spironolactone (ALDACTONE) 25 MG tablet Take 1 tablet (25 mg total) by mouth daily. 90 tablet 3  . valsartan (DIOVAN) 160 MG tablet TAKE 1 AND 1/2 TABLETS (TOTAL 240MG) DAILY 135 tablet 3  . vitamin C (ASCORBIC ACID) 500 MG tablet Take 500 mg by mouth 2 (two) times daily.      . WELCHOL 625 MG tablet CMD FOR 90D TAKE 3 TABLETS (1,875 MG TOTAL) BY MOUTH 2 (TWO) TIMES DAILY WITH A MEAL. 270 tablet 3  . zafirlukast (ACCOLATE) 20 MG tablet TAKE 1 TABLET (20 MG TOTAL) BY MOUTH 2 (TWO) TIMES DAILY. 180 tablet 4   No current facility-administered medications on file prior to visit.   Allergies  Allergen Reactions  . Adhesive [Tape] Other (See Comments)    Skin turns red   . Diphenhydramine Other (See Comments)    Pt feels wired   . Vicodin [Hydrocodone-Acetaminophen] Nausea And Vomiting   Social History   Social History  . Marital Status: Married    Spouse Name: N/A  . Number of Children: 2  . Years of Education: N/A   Occupational History  . retired     post Furniture conservator/restorer   Social History Main Topics  . Smoking status: Never Smoker   . Smokeless tobacco: Never Used  . Alcohol Use: No  . Drug Use: No  . Sexual Activity: No   Other Topics Concern  . Not on file   Social History Narrative      Review of Systems  All other systems reviewed and are negative.      Objective:   Physical Exam  Constitutional: She appears well-developed and well-nourished.  Cardiovascular: Normal rate.   Pulmonary/Chest: Effort normal and breath sounds normal. No respiratory distress. She has no wheezes.  Musculoskeletal:       Left knee: She exhibits decreased  range of motion, swelling and effusion. She exhibits no LCL laxity, normal meniscus and no MCL laxity. Tenderness found. Medial joint line and lateral joint line tenderness noted. No MCL and no LCL tenderness noted.  Vitals reviewed.         Assessment & Plan:  Left knee pain Patient has osteoarthritis in the left knee. Using sterile technique, I injected the  left knee with a mixture of 2 mL of lidocaine, 2 mL of Marcaine, and 2 mL of 40 mg per mL Kenalog. The patient tolerated the procedure well with no complications. Recheck in one week if no better or sooner if worse.

## 2016-02-06 ENCOUNTER — Other Ambulatory Visit: Payer: Self-pay | Admitting: Family Medicine

## 2016-02-06 NOTE — Telephone Encounter (Signed)
Refill appropriate and filled per protocol. 

## 2016-02-11 ENCOUNTER — Other Ambulatory Visit (HOSPITAL_COMMUNITY): Payer: Self-pay | Admitting: Cardiology

## 2016-02-12 ENCOUNTER — Other Ambulatory Visit (HOSPITAL_COMMUNITY): Payer: Self-pay | Admitting: Cardiology

## 2016-02-12 ENCOUNTER — Other Ambulatory Visit: Payer: Self-pay | Admitting: Family Medicine

## 2016-03-13 ENCOUNTER — Other Ambulatory Visit: Payer: Self-pay | Admitting: Family Medicine

## 2016-03-17 ENCOUNTER — Other Ambulatory Visit: Payer: Self-pay | Admitting: Family Medicine

## 2016-03-17 NOTE — Telephone Encounter (Signed)
Refill appropriate and filled per protocol. 

## 2016-03-20 ENCOUNTER — Other Ambulatory Visit: Payer: Self-pay | Admitting: Internal Medicine

## 2016-04-04 ENCOUNTER — Other Ambulatory Visit: Payer: Self-pay | Admitting: Family Medicine

## 2016-04-06 NOTE — Telephone Encounter (Signed)
Refill appropriate and filled per protocol. 

## 2016-05-02 ENCOUNTER — Other Ambulatory Visit: Payer: Self-pay | Admitting: Family Medicine

## 2016-05-27 ENCOUNTER — Encounter (HOSPITAL_COMMUNITY): Payer: Self-pay

## 2016-06-10 DIAGNOSIS — Z23 Encounter for immunization: Secondary | ICD-10-CM | POA: Diagnosis not present

## 2016-07-07 ENCOUNTER — Other Ambulatory Visit: Payer: Self-pay | Admitting: Family Medicine

## 2016-07-09 ENCOUNTER — Ambulatory Visit (HOSPITAL_COMMUNITY)
Admission: RE | Admit: 2016-07-09 | Discharge: 2016-07-09 | Disposition: A | Discharge status: Home / Self Care | Payer: Medicare Other | Source: Ambulatory Visit | Attending: Cardiology | Admitting: Cardiology

## 2016-07-09 VITALS — BP 126/86 | HR 87 | Wt 183.4 lb

## 2016-07-09 DIAGNOSIS — Z7901 Long term (current) use of anticoagulants: Secondary | ICD-10-CM | POA: Diagnosis not present

## 2016-07-09 DIAGNOSIS — E785 Hyperlipidemia, unspecified: Secondary | ICD-10-CM | POA: Insufficient documentation

## 2016-07-09 DIAGNOSIS — I480 Paroxysmal atrial fibrillation: Secondary | ICD-10-CM | POA: Insufficient documentation

## 2016-07-09 DIAGNOSIS — I10 Essential (primary) hypertension: Secondary | ICD-10-CM

## 2016-07-09 DIAGNOSIS — R5383 Other fatigue: Secondary | ICD-10-CM | POA: Insufficient documentation

## 2016-07-09 DIAGNOSIS — I428 Other cardiomyopathies: Secondary | ICD-10-CM | POA: Insufficient documentation

## 2016-07-09 DIAGNOSIS — Z79899 Other long term (current) drug therapy: Secondary | ICD-10-CM | POA: Diagnosis not present

## 2016-07-09 DIAGNOSIS — I447 Left bundle-branch block, unspecified: Secondary | ICD-10-CM | POA: Diagnosis not present

## 2016-07-09 DIAGNOSIS — E119 Type 2 diabetes mellitus without complications: Secondary | ICD-10-CM | POA: Diagnosis not present

## 2016-07-09 LAB — BASIC METABOLIC PANEL
Anion gap: 9 (ref 5–15)
BUN: 18 mg/dL (ref 6–20)
CHLORIDE: 104 mmol/L (ref 101–111)
CO2: 25 mmol/L (ref 22–32)
CREATININE: 0.9 mg/dL (ref 0.44–1.00)
Calcium: 8.9 mg/dL (ref 8.9–10.3)
GFR calc non Af Amer: >60 mL/min (ref >60)
Glucose, Bld: 159 mg/dL — ABNORMAL HIGH (ref 65–99)
POTASSIUM: 3.7 mmol/L (ref 3.5–5.1)
Sodium: 138 mmol/L (ref 135–145)

## 2016-07-09 LAB — LIPID PANEL
CHOLESTEROL: 106 mg/dL (ref 0–200)
HDL: 37 mg/dL — AB (ref >40)
LDL CALC: 49 mg/dL (ref 0–99)
TRIGLYCERIDES: 101 mg/dL (ref <150)
Total CHOL/HDL Ratio: 2.9 RATIO
VLDL: 20 mg/dL (ref 0–40)

## 2016-07-09 LAB — TSH: TSH: 1.528 u[IU]/mL (ref 0.350–4.500)

## 2016-07-09 LAB — BRAIN NATRIURETIC PEPTIDE: B Natriuretic Peptide: 62.4 pg/mL (ref 0.0–100.0)

## 2016-07-09 LAB — T4, FREE: Free T4: 0.86 ng/dL (ref 0.61–1.12)

## 2016-07-09 MED ORDER — CARVEDILOL 12.5 MG PO TABS: 18.7500 mg | ORAL_TABLET | Freq: Two times a day (BID) | ORAL | 3 refills | Status: DC

## 2016-07-09 NOTE — Patient Instructions (Addendum)
Increase coreg to18.75 mg (one and one half tab) twice a day  Labs today We will only contact you if something comes back abnormal or we need to make some changes. Otherwise no news is good news!  Your physician recommends that you schedule a follow-up appointment in: 3 months with Dr Aundra Dubin and echo  Your physician has requested that you have an echocardiogram. Echocardiography is a painless test that uses sound waves to create images of your heart. It provides your doctor with information about the size and shape of your heart and how well your heart's chambers and valves are working. This procedure takes approximately one hour. There are no restrictions for this procedure.  Do the following things EVERYDAY: 1) Weigh yourself in the morning before breakfast. Write it down and keep it in a log. 2) Take your medicines as prescribed 3) Eat low salt foods-Limit salt (sodium) to 2000 mg per day.  4) Stay as active as you can everyday 5) Limit all fluids for the day to less than 2 liters

## 2016-07-09 NOTE — Progress Notes (Signed)
Advanced Heart Failure Medication Review by a Pharmacist  Does the patient  feel that his/her medications are working for him/her?  yes  Has the patient been experiencing any side effects to the medications prescribed?  no  Does the patient measure his/her own blood pressure or blood glucose at home?  no   Does the patient have any problems obtaining medications due to transportation or finances?   no  Understanding of regimen: good Understanding of indications: good Potential of compliance: good Patient understands to avoid NSAIDs. Patient understands to avoid decongestants.  Issues to address at subsequent visits: None   Pharmacist comments:  Ms. Insinga is a pleasant 70 yo F presenting with a current medication list. She reports good compliance with her regimen. She did state that she has been having abnormal dreams recently that present her with an impossible problem to solve. It does seem like Dexilant carries a < 2% risk for this side effect. She did not have any other medication-related questions or concerns for me at this time.   Ruta Hinds. Velva Harman, PharmD, BCPS, CPP Clinical Pharmacist Pager: [phone] Phone: [phone] 07/09/2016 1:58 PM      Time with patient: 10 minutes Preparation and documentation time: 4 minutes Total time: 14 minutes

## 2016-07-09 NOTE — Progress Notes (Signed)
Patient ID: [name], female   DOB: 02/20/1946, 70 y.o.   MRN: [id number]    Advanced Heart Failure Clinic Note   PCP: Dr. Pickard HF: Dr. McLean   70 yo with history of HTN and type II diabetes presents for cardiology followup.  Back in 2009, she had an echo with normal EF.  She was admitted in 9/13 with chest pain and LBBB.  She had LHC showing no CAD but EF 35-40%.  Echo was a technically difficult study with EF reported as 50-55%.  She has been taking ARB and Coreg.  Repeat echo in 12/13 showed EF 40% with septal hypokinesis.  Cardiac MRI was done in 12/13 as well, with calculated EF 50%, global hypokinesis, and no delayed enhancement.  Echo in 7/15 showed EF 55-60% with mild MR and mild TR (no LBBB at this time).   In 3/16, she began to develop increased dyspnea.  She was seen by Lori Gerhardt and was noted to have a LBBB again. Echo in 3/16 showed EF down to 35-40%.  She was then seen in followup by Rhonda Barrett.  Alivecor on her phone showed a period of HR up to 160s that appeared to be atrial fibrillation.  She felt tachypalpitations. Coreg was increased and she was started on apixaban 5 mg bid. CPX in 4/16 showed a submaximal effort but normal spirometry and normal peak VO2.  She did have ST changes possibly concerning for ischemia (but has baseline LBBB so hard to interpret).    Cardiolite in 5/16 showed on ischemia or infarction.  Most recent echo in 2/17 showed EF 45-50%, diffuse hypokinesis, grade I diastolic dysfunction, normal RV size and systolic function.   She presents today for 6 month follow up.  States last week she just wasn't feeling good so called for appointment. Sometimes she can weed the yard and feel fine, and other days it wipes her out.  Has being doing landscaping with similar presentation, at times she doesn't have any difficulty and other times will need to rest the next day.  She did [place] pal[place]. No CP. No  dyspnea on flat ground. Does get SOB walking up a hill.   Labs (9/13): TSH 4.884 (elevated), proBNP 155 Labs (10/13): K 3.6, creatinine 0.8 Labs (1/14): K 3.7, creatinine 0.8, BNP not elevated, ANA weakly positive, TSH normal Labs (7/14): BNP normal Labs (11/14): K 4.1, creatinine 0.73 Labs (10/15): K 4.2, creatinine 0.73, LDL 45 Labs (3/16): BNP 46, HCT 42.2, TSH normal Labs (4/16): K 4.1, creatinine 0.78 => 0.8, HCT 39.3 Labs (1/17): K 4.6, creatinine 1.06, hgb 13.1, BNP 22  PMH: 1. LBBB/IVCD: Now persistent.   2. Breast cancer s/p bilateral mastectomy. 3. GERD 4. Type II diabetes mellitus 5. HTN 6. Asthma 7. PE in 2001 8. Hyperlipidemia 9. Nonischemic Cardiomyopathy: Possible LBBB cardiomyopathy. Echo 8/09 with 60%.  Admitted in 9/13 with chest pain.  LHC showed no angiographic CAD and EF 35-40%.  Echo at that time was read as showing EF 50-55% but was a technically difficult study.  Echo (12/13): EF 40% with septal hypokinesis, normal RV size and systolic function, no significant valvular abnormalities.  Cardiac MRI (12/13): EF 50%, global hypokinesis, no delayed enhancement.  Echo (7/14) with EF 50-55%, mild LV dilation, mild LAE.  Echo (7/15) with EF 55-60%, mild MR and mild TR.  Echo (3/16) with EF 35-40%, septal-lateral dyssynchrony.  CPX (4/16) with RER 0.98 (submaximal), peak VO2   19.5, VE/VCO2 slope 29.4 => normal spirometry, normal peak VO2, but ST changes suggest possibility of ischemia.  Lexiscan Cardiolite (5/16) with no ischemia/infarction.  Echo (2/17) with EF 45-50%, diffuse hypokinesis, grade I diastolic dysfunction, normal RV size and systolic function.  10. Supraclavicular lymphadenopathy: Stable by CT back to 2009, suspect benign.  11. Atrial fibrillation: Paroxysmal.  Holter (4/16) with rare PACs and PVCs, short run 5 beats SVT.   SH: Married, lives in [facility], never smoked.  FH: Uncle with ? SCD.  Brother with ? SCD.    Current Outpatient  Prescriptions  Medication Sig Dispense Refill  . atorvastatin (LIPITOR) 20 MG tablet TAKE 2 TABLETS (40 MG TOTAL) BY MOUTH EVERY EVENING. 180 tablet 0  . Blood Glucose Monitoring Suppl (ONE TOUCH ULTRA 2) W/DEVICE KIT Check FBS qam - Dx:250.00 and she will need lancets (1 box) , strips (1 bottle = 100) and controls also thanks 1 each 0  . Calcium Carbonate-Vitamin D (CALCIUM-VITAMIN D) 500-200 MG-UNIT per tablet Take 1 tablet by mouth 2 (two) times daily with a meal.     . carvedilol (COREG) 12.5 MG tablet TAKE 1 TWICE A DAY . TAKE AN EXTRA 1/2 TAB FOR SUSTAINED HEART RATE OVER 120 UP TO TWICE DAILY 210 tablet 3  . cetirizine (ZYRTEC) 10 MG tablet Take 10 mg by mouth every evening.    . cyanocobalamin 500 MCG tablet Take 500 mcg by mouth daily. 10/25/12--Pt not sure of dose    . DEXILANT 60 MG capsule TAKE 1 CAPSULE (60 MG TOTAL) BY MOUTH DAILY. 90 capsule 1  . ELIQUIS 5 MG TABS tablet TAKE 1 TABLET (5 MG TOTAL) BY MOUTH 2 (TWO) TIMES DAILY. 180 tablet 3  . fluticasone (FLONASE) 50 MCG/ACT nasal spray USE 2 SPRAYS IN EACH NOSTRIL DAILY FOR 3 MONTHS 48 g 0  . furosemide (LASIX) 20 MG tablet Take 1 tablet (20 mg total) by mouth daily. 90 tablet 3  . glucose blood test strip accu check comfort curve Checks FBS qam 100 each 5  . spironolactone (ALDACTONE) 25 MG tablet TAKE 1 TABLET (25 MG TOTAL) BY MOUTH DAILY. 90 tablet 3  . valsartan (DIOVAN) 160 MG tablet TAKE 1 AND 1/2 TABLETS (TOTAL 240MG) DAILY 135 tablet 3  . vitamin C (ASCORBIC ACID) 500 MG tablet Take 500 mg by mouth 2 (two) times daily.      . WELCHOL 625 MG tablet TAKE 3 TABLETS (1,875 MG TOTAL) BY MOUTH 2 (TWO) TIMES DAILY WITH A MEAL. 540 tablet 3  . zafirlukast (ACCOLATE) 20 MG tablet TAKE 1 TABLET (20 MG TOTAL) BY MOUTH 2 (TWO) TIMES DAILY. 180 tablet 4  . nitroGLYCERIN (NITROSTAT) 0.4 MG SL tablet Place 1 tablet (0.4 mg total) under the tongue every 5 (five) minutes as needed for chest pain. (Patient not taking: Reported on 07/09/2016) 25  tablet prn   No current facility-administered medications for this encounter.     BP 126/86 (BP Location: Left Arm, Patient Position: Sitting, Cuff Size: Normal)   Pulse 87   Wt 183 lb 6.4 oz (83.2 kg)   SpO2 95%   BMI 33.54 kg/m    Wt Readings from Last 3 Encounters:  07/09/16 183 lb 6.4 oz (83.2 kg)  01/21/16 183 lb (83 kg)  11/20/15 182 lb 4 oz (82.7 kg)    General: NAD Neck: JVD flat, no thyromegaly or nodule noted. Lungs: CTAB, normal effort CV: Nondisplaced PMI.  Regular S1/S2 with paradoxical S2 split, no S3/S4, no  murmur appreciated.  No carotid bruit.  Normal pedal pulses. No chest wall tenderness. Abdomen: Soft, NT, ND, no HSM. No bruits or masses. +BS  Neurologic: Alert and oriented x 3.  Psych: Normal affect. Extremities: No clubbing or cyanosis. No edema  EKG:  NSR 71 bpm, LBBB, QRS 154  Assessment/Plan: 1. Nonischemic cardiomyopathy:  NYHA class II symptoms. Volume status stable on exam.  No CAD on cath 9/13 but EF 35-40% at that time.  ECG showed LBBB.  LBBB resolved and EF normalized.  However, in 3/16, patient had fatigue and dyspnea => repeat echo with EF back down to 35-40%, she now has a persistent LBBB. This may be a LBBB cardiomyopathy.  Most recent echo in 2/17 showed EF 45-50%.  CPX in 4/16 was submaximal but showed normal spirometry an actually near normal peak VO2.   - Volume status looks great on exam. Continue lasix 20 mg daily.  BMET/BNP today. - Increase Coreg 18.75 mg BID   - Continue valsartan 240 mg daily. - Continue spironolactone 25 mg daily 2. Hyperlipidemia:  - Continue atorvastatin 40 mg qhs. - Lipid levels today.  3. Atrial fibrillation: Paroxysmal.   -Continue Eliquis 5 mg bid.  - Having more frequent palpitations. EKG shows NSR with LBBB.  4. LBBB: Persistent and may be contributing to cardiomyopathy.  5. Fatigue - Pt has many non-specific complaints. Will check TSH as well as other labs today.    Pt unable to pin down exactly how  she feels "bad". Will check thyroid panel as she has not had checked in sometime. No bleeding on eliquis.   She does feel somewhat "better" when she takes an extra half of Coreg, so will increase her dose to 18.75 as above.    Labs today. RTC 3 months with Echo.   Shirley Friar, PA-C 07/09/2016  Total time spent > 25 minutes. Over half that time spent discussing the above.

## 2016-07-20 ENCOUNTER — Ambulatory Visit (INDEPENDENT_AMBULATORY_CARE_PROVIDER_SITE_OTHER): Payer: Medicare Other | Admitting: Family Medicine

## 2016-07-20 ENCOUNTER — Encounter: Payer: Self-pay | Admitting: Family Medicine

## 2016-07-20 VITALS — BP 100/60 | HR 80 | Temp 98.1°F | Resp 18 | Ht 62.0 in | Wt 182.0 lb

## 2016-07-20 DIAGNOSIS — I200 Unstable angina: Secondary | ICD-10-CM | POA: Diagnosis not present

## 2016-07-20 DIAGNOSIS — M25562 Pain in left knee: Secondary | ICD-10-CM

## 2016-07-20 DIAGNOSIS — G8929 Other chronic pain: Secondary | ICD-10-CM | POA: Diagnosis not present

## 2016-07-20 NOTE — Progress Notes (Signed)
Subjective:    Patient ID: [name], female    DOB: 1946/07/04, 70 y.o.   MRN: [id number]  HPI10/10/16 Patient presents with left knee pain. She has osteoarthritis in the left knee and underwent arthroscopic surgery  For the same diagnosis in 2006. She received steroid injections in her left knee periodically over the last 10 years due to pain. The pain is completely the same as it has been over the last 10 years. It is worse going up or coming down steps. It is worse with walking. She also has a mild knee effusion. There is no erythema or warmth around the knee. She denies any falls or injuries recently. She denies any night sweats or bone pain anywhere else in the body.  At that time,my plan was: Patient has osteoarthritis in the left knee. Using sterile technique, I injected the left knee with a mixture of 2 mL of lidocaine, 2 mL of Marcaine, and 2 mL of 40 mg per mL Kenalog. The patient tolerated the procedure well with no complications. Recheck in one week if no better or sooner if worse.  01/21/16 Patient is requesting a repeat injection in her left knee. The previous steroid injection lasted approximately 6 months. The pain just recently returned. She continues to have pain going down steps which is worsened with walking. She also has an effusion in her left knee because of stiffness and pain.  At that time, my plan was: Patient has osteoarthritis in the left knee. Using sterile technique, I injected the left knee with a mixture of 2 mL of lidocaine, 2 mL of Marcaine, and 2 mL of 40 mg per mL Kenalog. The patient tolerated the procedure well with no complications. Recheck in one week if no better or sooner if worse.  07/20/16 The patient's left knee is hurting her again. She has been painting her house. She's been climbing up and down ladders. She is now having pain worse over the medial compartment of the left knee but also the lateral compartment. She is requesting a repeat cortisone  injection Past Medical History:  Diagnosis Date  . Allergy history unknown   . Arthritis   . Asthma   . Breast cancer ([place]) 1999; 2008   hx of bilateral breast cancer  . Breast cancer, left breast ([place]) 10/27/2012   3.2 cm ER positive, Her-2 negative, 1 node positive S/P mastectomy/TTRAM reconstruction 4/08  . Breast cancer, right breast ([place]) 10/27/2012   3.2 cm ER/PR positive, 1 node positive, Her-2 negative Rx w mastectomy, AC -T chemo, followed by aromatase inhibitor x 5 years dx 4/08  . Cancer [facility])    breast CA  . Diabetes ([place])   . DM2 (diabetes mellitus, type 2) ([place])   . Elevated cholesterol   . GERD (gastroesophageal reflux disease)   . Hiatal hernia   . Hypertension   . LBBB (left bundle branch block) 10/27/2012  . Leg cramps   . NICM (nonischemic cardiomyopathy) ([place])    a. Echo:  06/14/12: Poor endocardial definition, possible septal HK, EF 50-55%, normal wall motion, mild LAE ;  b.  [facility] 9/13:  normal cors, EF 35-40%,  c. Echo 7/14: EF 50-55%, mild LAE  . Other fatigue 01/01/2015   Past Surgical History:  Procedure Laterality Date  . DILATION AND CURETTAGE OF UTERUS    . KNEE SURGERY     left  . LEFT HEART CATHETERIZATION WITH CORONARY ANGIOGRAM N/A 06/15/2012   Procedure: LEFT HEART CATHETERIZATION WITH CORONARY  ANGIOGRAM;  Surgeon: Wellington Hampshire, MD;  Location: [facility] C[facility] LAB;  Service: Cardiovascular;  Laterality: N/A;  . MASTECTOMY     bilateral   Current Outpatient Prescriptions on File Prior to Visit  Medication Sig Dispense Refill  . atorvastatin (LIPITOR) 20 MG tablet TAKE 2 TABLETS (40 MG TOTAL) BY MOUTH EVERY EVENING. 180 tablet 0  . Blood Glucose Monitoring Suppl (ONE TOUCH ULTRA 2) W/DEVICE KIT Check FBS qam - Dx:250.00 and she will need lancets (1 box) , strips (1 bottle = 100) and controls also thanks 1 each 0  . Calcium Carbonate-Vitamin D (CALCIUM-VITAMIN D) 500-200 MG-UNIT per tablet Take 1 tablet by mouth 2 (two) times daily with a meal.     .  carvedilol (COREG) 12.5 MG tablet Take 1.5 tablets (18.75 mg total) by mouth 2 (two) times daily with a meal. 270 tablet 3  . cetirizine (ZYRTEC) 10 MG tablet Take 10 mg by mouth every evening.    . cyanocobalamin 500 MCG tablet Take 500 mcg by mouth daily. 10/25/12--Pt not sure of dose    . DEXILANT 60 MG capsule TAKE 1 CAPSULE (60 MG TOTAL) BY MOUTH DAILY. 90 capsule 1  . ELIQUIS 5 MG TABS tablet TAKE 1 TABLET (5 MG TOTAL) BY MOUTH 2 (TWO) TIMES DAILY. 180 tablet 3  . fluticasone (FLONASE) 50 MCG/ACT nasal spray USE 2 SPRAYS IN EACH NOSTRIL DAILY FOR 3 MONTHS 48 g 0  . furosemide (LASIX) 20 MG tablet Take 1 tablet (20 mg total) by mouth daily. 90 tablet 3  . glucose blood test strip accu check comfort curve Checks FBS qam 100 each 5  . nitroGLYCERIN (NITROSTAT) 0.4 MG SL tablet Place 1 tablet (0.4 mg total) under the tongue every 5 (five) minutes as needed for chest pain. 25 tablet prn  . spironolactone (ALDACTONE) 25 MG tablet TAKE 1 TABLET (25 MG TOTAL) BY MOUTH DAILY. 90 tablet 3  . valsartan (DIOVAN) 160 MG tablet TAKE 1 AND 1/2 TABLETS (TOTAL 240MG) DAILY 135 tablet 3  . vitamin C (ASCORBIC ACID) 500 MG tablet Take 500 mg by mouth 2 (two) times daily.      . WELCHOL 625 MG tablet TAKE 3 TABLETS (1,875 MG TOTAL) BY MOUTH 2 (TWO) TIMES DAILY WITH A MEAL. 540 tablet 3  . zafirlukast (ACCOLATE) 20 MG tablet TAKE 1 TABLET (20 MG TOTAL) BY MOUTH 2 (TWO) TIMES DAILY. 180 tablet 4   No current facility-administered medications on file prior to visit.    Allergies  Allergen Reactions  . Adhesive [Tape] Other (See Comments)    Skin turns red   . Diphenhydramine Other (See Comments)    Pt feels wired   . Vicodin [Hydrocodone-Acetaminophen] Nausea And Vomiting   Social History   Social History  . Marital status: Married    Spouse name: N/A  . Number of children: 2  . Years of education: N/A   Occupational History  . retired Retired    post Furniture conservator/restorer   Social History Main  Topics  . Smoking status: Never Smoker  . Smokeless tobacco: Never Used  . Alcohol use No  . Drug use: No  . Sexual activity: No   Other Topics Concern  . Not on file   Social History Narrative  . No narrative on file      Review of Systems  All other systems reviewed and are negative.      Objective:   Physical Exam  Constitutional: She appears well-developed and well-nourished.  Cardiovascular: Normal rate.   Pulmonary/Chest: Effort normal and breath sounds normal. No respiratory distress. She has no wheezes.  Musculoskeletal:       Left knee: She exhibits decreased range of motion, swelling and effusion. She exhibits no LCL laxity, normal meniscus and no MCL laxity. Tenderness found. Medial joint line and lateral joint line tenderness noted. No MCL and no LCL tenderness noted.  Vitals reviewed.         Assessment & Plan:  Left knee pain   Patient has osteoarthritis in the left knee. Using sterile technique, I injected the left knee with a mixture of 2 mL of lidocaine, 2 mL of Marcaine, and 2 mL of 40 mg per mL Kenalog. The patient tolerated the procedure well with no complications. Recheck in one week if no better or sooner if worse.

## 2016-07-27 ENCOUNTER — Other Ambulatory Visit: Payer: Medicare Other

## 2016-07-27 DIAGNOSIS — IMO0002 Reserved for concepts with insufficient information to code with codable children: Secondary | ICD-10-CM

## 2016-07-27 DIAGNOSIS — Z79899 Other long term (current) drug therapy: Secondary | ICD-10-CM | POA: Diagnosis not present

## 2016-07-27 DIAGNOSIS — E118 Type 2 diabetes mellitus with unspecified complications: Secondary | ICD-10-CM | POA: Diagnosis not present

## 2016-07-27 DIAGNOSIS — E1165 Type 2 diabetes mellitus with hyperglycemia: Secondary | ICD-10-CM

## 2016-07-28 ENCOUNTER — Ambulatory Visit: Payer: Self-pay | Admitting: Family Medicine

## 2016-07-28 ENCOUNTER — Ambulatory Visit: Payer: Medicare Other | Admitting: Family Medicine

## 2016-07-28 LAB — COMPLETE METABOLIC PANEL WITH GFR
ALT: 17 U/L (ref 6–29)
AST: 14 U/L (ref 10–35)
Albumin: 4 g/dL (ref 3.6–5.1)
Alkaline Phosphatase: 63 U/L (ref 33–130)
BUN: 24 mg/dL (ref 7–25)
CHLORIDE: 108 mmol/L (ref 98–110)
CO2: 21 mmol/L (ref 20–31)
CREATININE: 0.92 mg/dL (ref 0.60–0.93)
Calcium: 9.4 mg/dL (ref 8.6–10.4)
GFR, Est African American: 72 mL/min (ref >=60)
GFR, Est Non African American: 62 mL/min (ref >=60)
GLUCOSE: 120 mg/dL — AB (ref 70–99)
Potassium: 4.8 mmol/L (ref 3.5–5.3)
Sodium: 139 mmol/L (ref 135–146)
Total Bilirubin: 0.5 mg/dL (ref 0.2–1.2)
Total Protein: 6.6 g/dL (ref 6.1–8.1)

## 2016-07-28 LAB — HEMOGLOBIN A1C
Hgb A1c MFr Bld: 6.3 % — ABNORMAL HIGH (ref <5.7)
MEAN PLASMA GLUCOSE: 134 mg/dL

## 2016-08-24 ENCOUNTER — Ambulatory Visit (INDEPENDENT_AMBULATORY_CARE_PROVIDER_SITE_OTHER): Payer: Medicare Other | Admitting: Family Medicine

## 2016-08-24 ENCOUNTER — Encounter: Payer: Self-pay | Admitting: Family Medicine

## 2016-08-24 VITALS — BP 98/58 | HR 66 | Temp 98.1°F | Resp 18 | Ht 62.0 in | Wt 180.0 lb

## 2016-08-24 DIAGNOSIS — I200 Unstable angina: Secondary | ICD-10-CM | POA: Diagnosis not present

## 2016-08-24 DIAGNOSIS — E119 Type 2 diabetes mellitus without complications: Secondary | ICD-10-CM | POA: Diagnosis not present

## 2016-08-24 NOTE — Progress Notes (Signed)
Subjective:    Patient ID: [name], female    DOB: 09/08/1946, 70 y.o.   MRN: [id number]  HPI Patient has type 2 diabetes mellitus. She is currently on WelChol. She is on this medication because she cannot tolerate metformin. She also has chronic diarrhea. The WelChol has help manage her chronic diarrhea as well as reduce her sugars. Her most recent hemoglobin A1c was outstanding at 6.3. She also has a history of cardiomyopathy/congestive heart failure. We had a long discussion today about new therapies including entresto and switching welchol to jardiance for CV benefit. Past Medical History:  Diagnosis Date  . Allergy history unknown   . Arthritis   . Asthma   . Breast cancer ([place]) 1999; 2008   hx of bilateral breast cancer  . Breast cancer, left breast ([place]) 10/27/2012   3.2 cm ER positive, Her-2 negative, 1 node positive S/P mastectomy/TTRAM reconstruction 4/08  . Breast cancer, right breast ([place]) 10/27/2012   3.2 cm ER/PR positive, 1 node positive, Her-2 negative Rx w mastectomy, AC -T chemo, followed by aromatase inhibitor x 5 years dx 4/08  . Cancer [facility])    breast CA  . Diabetes ([place])   . DM2 (diabetes mellitus, type 2) ([place])   . Elevated cholesterol   . GERD (gastroesophageal reflux disease)   . Hiatal hernia   . Hypertension   . LBBB (left bundle branch block) 10/27/2012  . Leg cramps   . NICM (nonischemic cardiomyopathy) ([place])    a. Echo:  06/14/12: Poor endocardial definition, possible septal HK, EF 50-55%, normal wall motion, mild LAE ;  b.  [facility] 9/13:  normal cors, EF 35-40%,  c. Echo 7/14: EF 50-55%, mild LAE  . Other fatigue 01/01/2015   Past Surgical History:  Procedure Laterality Date  . DILATION AND CURETTAGE OF UTERUS    . KNEE SURGERY     left  . LEFT HEART CATHETERIZATION WITH CORONARY ANGIOGRAM N/A 06/15/2012   Procedure: LEFT HEART CATHETERIZATION WITH CORONARY ANGIOGRAM;  Surgeon: Wellington Hampshire, MD;  Location: [place] CATH LAB;  Service: Cardiovascular;   Laterality: N/A;  . MASTECTOMY     bilateral   Current Outpatient Prescriptions on File Prior to Visit  Medication Sig Dispense Refill  . atorvastatin (LIPITOR) 20 MG tablet TAKE 2 TABLETS (40 MG TOTAL) BY MOUTH EVERY EVENING. 180 tablet 0  . Blood Glucose Monitoring Suppl (ONE TOUCH ULTRA 2) W/DEVICE KIT Check FBS qam - Dx:250.00 and she will need lancets (1 box) , strips (1 bottle = 100) and controls also thanks 1 each 0  . Calcium Carbonate-Vitamin D (CALCIUM-VITAMIN D) 500-200 MG-UNIT per tablet Take 1 tablet by mouth 2 (two) times daily with a meal.     . carvedilol (COREG) 12.5 MG tablet Take 1.5 tablets (18.75 mg total) by mouth 2 (two) times daily with a meal. 270 tablet 3  . cetirizine (ZYRTEC) 10 MG tablet Take 10 mg by mouth every evening.    . cyanocobalamin 500 MCG tablet Take 500 mcg by mouth daily. 10/25/12--Pt not sure of dose    . DEXILANT 60 MG capsule TAKE 1 CAPSULE (60 MG TOTAL) BY MOUTH DAILY. 90 capsule 1  . ELIQUIS 5 MG TABS tablet TAKE 1 TABLET (5 MG TOTAL) BY MOUTH 2 (TWO) TIMES DAILY. 180 tablet 3  . fluticasone (FLONASE) 50 MCG/ACT nasal spray USE 2 SPRAYS IN EACH NOSTRIL DAILY FOR 3 MONTHS 48 g 0  . furosemide (LASIX) 20 MG tablet Take 1 tablet (  20 mg total) by mouth daily. 90 tablet 3  . glucose blood test strip accu check comfort curve Checks FBS qam 100 each 5  . nitroGLYCERIN (NITROSTAT) 0.4 MG SL tablet Place 1 tablet (0.4 mg total) under the tongue every 5 (five) minutes as needed for chest pain. 25 tablet prn  . spironolactone (ALDACTONE) 25 MG tablet TAKE 1 TABLET (25 MG TOTAL) BY MOUTH DAILY. 90 tablet 3  . valsartan (DIOVAN) 160 MG tablet TAKE 1 AND 1/2 TABLETS (TOTAL 240MG) DAILY 135 tablet 3  . vitamin C (ASCORBIC ACID) 500 MG tablet Take 500 mg by mouth 2 (two) times daily.      . WELCHOL 625 MG tablet TAKE 3 TABLETS (1,875 MG TOTAL) BY MOUTH 2 (TWO) TIMES DAILY WITH A MEAL. 540 tablet 3  . zafirlukast (ACCOLATE) 20 MG tablet TAKE 1 TABLET (20 MG TOTAL)  BY MOUTH 2 (TWO) TIMES DAILY. 180 tablet 4   No current facility-administered medications on file prior to visit.    Allergies  Allergen Reactions  . Adhesive [Tape] Other (See Comments)    Skin turns red   . Diphenhydramine Other (See Comments)    Pt feels wired   . Vicodin [Hydrocodone-Acetaminophen] Nausea And Vomiting   Social History   Social History  . Marital status: Married    Spouse name: N/A  . Number of children: 2  . Years of education: N/A   Occupational History  . retired Retired    post Furniture conservator/restorer   Social History Main Topics  . Smoking status: Never Smoker  . Smokeless tobacco: Never Used  . Alcohol use No  . Drug use: No  . Sexual activity: No   Other Topics Concern  . Not on file   Social History Narrative  . No narrative on file      Review of Systems  All other systems reviewed and are negative.      Objective:   Physical Exam  Constitutional: She appears well-developed and well-nourished.  Neck: No JVD present.  Cardiovascular: Normal rate, regular rhythm and normal heart sounds.   Pulmonary/Chest: Effort normal and breath sounds normal. No respiratory distress. She has no wheezes. She has no rales.  Abdominal: Soft. Bowel sounds are normal.  Musculoskeletal: She exhibits edema.  Vitals reviewed.         Assessment & Plan:  Controlled type 2 diabetes mellitus without complication, unspecified long term insulin use status ([place])  After a 20 minute discussion, the patient elects to continue WelChol and not switch to Jardiance due to the cost and also the benefit she gets from [facility] in managing her diarrhea.  Eye exam is up-to-date. Blood pressure is well controlled. If anything blood pressure slightly low. We did discuss discontinuing Diovan and possibly spironolactone and starting her on Entresto given er history of CHF.  I recommended the patient discuss this with her cardiologist

## 2016-09-04 ENCOUNTER — Other Ambulatory Visit: Payer: Self-pay | Admitting: Family Medicine

## 2016-09-04 ENCOUNTER — Other Ambulatory Visit: Payer: Self-pay | Admitting: Cardiology

## 2016-09-11 ENCOUNTER — Other Ambulatory Visit: Payer: Self-pay

## 2016-09-23 ENCOUNTER — Other Ambulatory Visit: Payer: Self-pay | Admitting: Physician Assistant

## 2016-09-23 DIAGNOSIS — E785 Hyperlipidemia, unspecified: Secondary | ICD-10-CM

## 2016-09-23 DIAGNOSIS — R0602 Shortness of breath: Secondary | ICD-10-CM

## 2016-09-23 DIAGNOSIS — I428 Other cardiomyopathies: Secondary | ICD-10-CM

## 2016-10-06 ENCOUNTER — Other Ambulatory Visit: Payer: Self-pay | Admitting: Family Medicine

## 2016-10-07 ENCOUNTER — Ambulatory Visit (HOSPITAL_BASED_OUTPATIENT_CLINIC_OR_DEPARTMENT_OTHER)
Admission: RE | Admit: 2016-10-07 | Discharge: 2016-10-07 | Disposition: A | Discharge status: Home / Self Care | Payer: Medicare Other | Source: Ambulatory Visit | Attending: Cardiology | Admitting: Cardiology

## 2016-10-07 ENCOUNTER — Ambulatory Visit (HOSPITAL_COMMUNITY)
Admission: RE | Admit: 2016-10-07 | Discharge: 2016-10-07 | Disposition: A | Discharge status: Home / Self Care | Payer: Medicare Other | Source: Ambulatory Visit | Attending: Family Medicine | Admitting: Family Medicine

## 2016-10-07 ENCOUNTER — Encounter (HOSPITAL_COMMUNITY): Payer: Self-pay

## 2016-10-07 VITALS — BP 128/66 | HR 72 | Wt 180.5 lb

## 2016-10-07 DIAGNOSIS — I10 Essential (primary) hypertension: Secondary | ICD-10-CM | POA: Insufficient documentation

## 2016-10-07 DIAGNOSIS — Z7951 Long term (current) use of inhaled steroids: Secondary | ICD-10-CM | POA: Insufficient documentation

## 2016-10-07 DIAGNOSIS — Z79899 Other long term (current) drug therapy: Secondary | ICD-10-CM | POA: Insufficient documentation

## 2016-10-07 DIAGNOSIS — E119 Type 2 diabetes mellitus without complications: Secondary | ICD-10-CM | POA: Diagnosis not present

## 2016-10-07 DIAGNOSIS — Z86711 Personal history of pulmonary embolism: Secondary | ICD-10-CM | POA: Insufficient documentation

## 2016-10-07 DIAGNOSIS — Z7901 Long term (current) use of anticoagulants: Secondary | ICD-10-CM | POA: Diagnosis not present

## 2016-10-07 DIAGNOSIS — J45909 Unspecified asthma, uncomplicated: Secondary | ICD-10-CM | POA: Diagnosis not present

## 2016-10-07 DIAGNOSIS — I447 Left bundle-branch block, unspecified: Secondary | ICD-10-CM | POA: Insufficient documentation

## 2016-10-07 DIAGNOSIS — I480 Paroxysmal atrial fibrillation: Secondary | ICD-10-CM

## 2016-10-07 DIAGNOSIS — Z9013 Acquired absence of bilateral breasts and nipples: Secondary | ICD-10-CM | POA: Insufficient documentation

## 2016-10-07 DIAGNOSIS — I428 Other cardiomyopathies: Secondary | ICD-10-CM | POA: Diagnosis not present

## 2016-10-07 DIAGNOSIS — E785 Hyperlipidemia, unspecified: Secondary | ICD-10-CM | POA: Diagnosis not present

## 2016-10-07 DIAGNOSIS — Z853 Personal history of malignant neoplasm of breast: Secondary | ICD-10-CM | POA: Insufficient documentation

## 2016-10-07 DIAGNOSIS — K219 Gastro-esophageal reflux disease without esophagitis: Secondary | ICD-10-CM | POA: Insufficient documentation

## 2016-10-07 LAB — BASIC METABOLIC PANEL
ANION GAP: 6 (ref 5–15)
BUN: 19 mg/dL (ref 6–20)
CHLORIDE: 104 mmol/L (ref 101–111)
CO2: 29 mmol/L (ref 22–32)
Calcium: 9.4 mg/dL (ref 8.9–10.3)
Creatinine, Ser: 0.9 mg/dL (ref 0.44–1.00)
GFR calc Af Amer: >60 mL/min (ref >60)
GLUCOSE: 114 mg/dL — AB (ref 65–99)
POTASSIUM: 4 mmol/L (ref 3.5–5.1)
Sodium: 139 mmol/L (ref 135–145)

## 2016-10-07 NOTE — Patient Instructions (Signed)
Lab today  We will contact you in 6 months to schedule your next appointment.  

## 2016-10-07 NOTE — Progress Notes (Signed)
  Echocardiogram 2D Echocardiogram has been performed.  Raghav Verrilli 10/07/2016, 11:10 AM

## 2016-10-08 NOTE — Progress Notes (Signed)
Patient ID: [name], female   DOB: 1946/07/07, 71 y.o.   MRN: [id number]    Advanced Heart Failure Clinic Note   PCP: Dr. Dennard Schaumann HF: Dr. Aundra Dubin   71 yo with history of HTN and type II diabetes presents for cardiology followup.  Back in 2009, she had an echo with normal EF.  She was admitted in 9/13 with chest pain and LBBB.  She had LHC showing no CAD but EF 35-40%.  Echo was a technically difficult study with EF reported as 50-55%.  She has been taking ARB and Coreg.  Repeat echo in 12/13 showed EF 40% with septal hypokinesis.  Cardiac MRI was done in 12/13 as well, with calculated EF 50%, global hypokinesis, and no delayed enhancement.  Echo in 7/15 showed EF 55-60% with mild MR and mild TR (no LBBB at this time).   In 3/16, she began to develop increased dyspnea.  She was seen by [name] and was noted to have a LBBB again. Echo in 3/16 showed EF down to 35-40%.  She was then seen in followup by [name].  Alivecor on her phone showed a period of HR up to 160s that appeared to be atrial fibrillation.  She felt tachypalpitations. Coreg was increased and she was started on apixaban 5 mg bid. CPX in 4/16 showed a submaximal effort but normal spirometry and normal peak VO2.  She did have ST changes possibly concerning for ischemia (but has baseline LBBB so hard to interpret).    Cardiolite in 5/16 showed on ischemia or infarction.  Most recent echo in 2/17 showed EF 45-50%, diffuse hypokinesis, grade I diastolic dysfunction, normal RV size and systolic function.  Echo 1/18 with EF stable at 45%.   Patient has been feeling reasonably well recently.  Mild dyspnea walking up a hill, no problems on flat ground.  She was able to paint her entire house recently. No chest pain.  No orthopnea/PND.  No palpitations. She has cut back her Coreg to 12.5 qam/18.75 qpm due to excessive fatigue on 18.75 bid.   Labs (9/13): TSH 4.884 (elevated), proBNP 155 Labs (10/13): K 3.6, creatinine 0.8 Labs  (1/14): K 3.7, creatinine 0.8, BNP not elevated, ANA weakly positive, TSH normal Labs (7/14): BNP normal Labs (11/14): K 4.1, creatinine 0.73 Labs (10/15): K 4.2, creatinine 0.73, LDL 45 Labs (3/16): BNP 46, HCT 42.2, TSH normal Labs (4/16): K 4.1, creatinine 0.78 => 0.8, HCT 39.3 Labs (1/17): K 4.6, creatinine 1.06, hgb 13.1, BNP 22 Labs (10/17): LDL 49, HDL 37, K 4.8, creatinine 0.92, TSH normal, BNP 62  PMH: 1. LBBB/IVCD: Now persistent.   2. Breast cancer s/p bilateral mastectomy. 3. GERD 4. Type II diabetes mellitus 5. HTN 6. Asthma 7. PE in 2001 8. Hyperlipidemia 9. Nonischemic Cardiomyopathy: Possible LBBB cardiomyopathy. Echo 8/09 with 60%.  Admitted in 9/13 with chest pain.  LHC showed no angiographic CAD and EF 35-40%.  Echo at that time was read as showing EF 50-55% but was a technically difficult study.  Echo (12/13): EF 40% with septal hypokinesis, normal RV size and systolic function, no significant valvular abnormalities.  Cardiac MRI (12/13): EF 50%, global hypokinesis, no delayed enhancement.  Echo (7/14) with EF 50-55%, mild LV dilation, mild LAE.  Echo (7/15) with EF 55-60%, mild MR and mild TR.  Echo (3/16) with EF 35-40%, septal-lateral dyssynchrony.  CPX (4/16) with RER 0.98 (submaximal), peak VO2 19.5, VE/VCO2 slope 29.4 => normal spirometry, normal peak VO2, but ST changes  suggest possibility of ischemia.  Lexiscan Cardiolite (5/16) with no ischemia/infarction.  Echo (2/17) with EF 45-50%, diffuse hypokinesis, grade I diastolic dysfunction, normal RV size and systolic function.  - Echo (1/18) with EF 45%, septal-lateral dyssynchrony, septal hypokinesis, normal RV size and systolic function.  10. Supraclavicular lymphadenopathy: Stable by CT back to 2009, suspect benign.  11. Atrial fibrillation: Paroxysmal.  Holter (4/16) with rare PACs and PVCs, short run 5 beats SVT.   SH: Married, lives in [facility], never smoked.  FH: Uncle with ? SCD.  Brother with  ? SCD.    Current Outpatient Prescriptions  Medication Sig Dispense Refill  . atorvastatin (LIPITOR) 20 MG tablet TAKE 2 TABLETS (40 MG TOTAL) BY MOUTH EVERY EVENING. 180 tablet 0  . Blood Glucose Monitoring Suppl (ONE TOUCH ULTRA 2) W/DEVICE KIT Check FBS qam - Dx:250.00 and she will need lancets (1 box) , strips (1 bottle = 100) and controls also thanks 1 each 0  . Calcium Carbonate-Vitamin D (CALCIUM-VITAMIN D) 500-200 MG-UNIT per tablet Take 1 tablet by mouth 2 (two) times daily with a meal.     . carvedilol (COREG) 12.5 MG tablet Take 1.5 tablets (18.75 mg total) by mouth 2 (two) times daily with a meal. 270 tablet 3  . cetirizine (ZYRTEC) 10 MG tablet Take 10 mg by mouth every evening.    . cyanocobalamin 500 MCG tablet Take 500 mcg by mouth daily. 10/25/12--Pt not sure of dose    . DEXILANT 60 MG capsule TAKE ONE CAPSULE BY MOUTH EVERY DAY 90 capsule 3  . ELIQUIS 5 MG TABS tablet TAKE 1 TABLET (5 MG TOTAL) BY MOUTH 2 (TWO) TIMES DAILY. 180 tablet 3  . fluticasone (FLONASE) 50 MCG/ACT nasal spray USE 2 SPRAYS IN EACH NOSTRIL DAILY FOR 3 MONTHS 48 g 0  . furosemide (LASIX) 20 MG tablet TAKE 1 TABLET (20 MG TOTAL) BY MOUTH DAILY. 90 tablet 3  . glucose blood test strip accu check comfort curve Checks FBS qam 100 each 5  . spironolactone (ALDACTONE) 25 MG tablet TAKE 1 TABLET (25 MG TOTAL) BY MOUTH DAILY. 90 tablet 3  . valsartan (DIOVAN) 160 MG tablet TAKE 1 AND 1/2 TABLETS (TOTAL 240MG) DAILY 135 tablet 3  . vitamin C (ASCORBIC ACID) 500 MG tablet Take 500 mg by mouth 2 (two) times daily.      . WELCHOL 625 MG tablet TAKE 3 TABLETS (1,875 MG TOTAL) BY MOUTH 2 (TWO) TIMES DAILY WITH A MEAL. 540 tablet 3  . zafirlukast (ACCOLATE) 20 MG tablet TAKE 1 TABLET (20 MG TOTAL) BY MOUTH 2 (TWO) TIMES DAILY. 180 tablet 4  . nitroGLYCERIN (NITROSTAT) 0.4 MG SL tablet Place 1 tablet (0.4 mg total) under the tongue every 5 (five) minutes as needed for chest pain. (Patient not taking: Reported on 10/07/2016)  25 tablet prn   No current facility-administered medications for this encounter.     BP 128/66   Pulse 72   Wt 180 lb 8 oz (81.9 kg)   SpO2 98%   BMI 33.01 kg/m    Wt Readings from Last 3 Encounters:  10/07/16 180 lb 8 oz (81.9 kg)  08/24/16 180 lb (81.6 kg)  07/20/16 182 lb (82.6 kg)    General: NAD Neck: JVD flat, no thyromegaly or nodule noted. Lungs: CTAB, normal effort CV: Nondisplaced PMI.  Regular S1/S2 with paradoxical S2 split, no S3/S4, no murmur appreciated.  No carotid bruit.  Normal pedal pulses. No chest wall tenderness. Abdomen: Soft,  NT, ND, no HSM. No bruits or masses. +BS  Neurologic: Alert and oriented x 3.  Psych: Normal affect. Extremities: No clubbing or cyanosis. No edema  Assessment/Plan: 1. Nonischemic cardiomyopathy:   No CAD on cath 9/13 but EF 35-40% at that time.  ECG showed LBBB.  LBBB resolved and EF normalized.  However, in 3/16, patient had fatigue and dyspnea => repeat echo with EF back down to 35-40%, she now has a persistent LBBB. This may be a LBBB cardiomyopathy.  CPX in 4/16 was submaximal but showed normal spirometry an actually near normal peak VO2.  I reviewed today's echo, EF stable at 45% with septal-lateral dyssynchrony.  NYHA class II symptoms. Volume status stable on exam. - Continue lasix 20 mg daily.  BMET today. - Continue Coreg 12.5 qam/18.75 qpm (she cannot tolerate 18.75 bid).   - Continue valsartan 240 mg daily. - Continue spironolactone 25 mg daily - As long as EF remains stable and symptoms are stable, I will continue the current regimen.  2. Hyperlipidemia:  Continue atorvastatin 40 mg qhs.  Good lipids 10/17.   3. Atrial fibrillation: Paroxysmal.  NSR today.  - Continue Eliquis 5 mg bid.   4. LBBB: Persistent and may be contributing to cardiomyopathy.   Followup in 6 months.   Loralie Champagne 10/08/2016

## 2016-11-18 DIAGNOSIS — M9903 Segmental and somatic dysfunction of lumbar region: Secondary | ICD-10-CM | POA: Diagnosis not present

## 2016-11-18 DIAGNOSIS — M9905 Segmental and somatic dysfunction of pelvic region: Secondary | ICD-10-CM | POA: Diagnosis not present

## 2016-11-18 DIAGNOSIS — M9902 Segmental and somatic dysfunction of thoracic region: Secondary | ICD-10-CM | POA: Diagnosis not present

## 2016-11-18 DIAGNOSIS — M25552 Pain in left hip: Secondary | ICD-10-CM | POA: Diagnosis not present

## 2016-11-18 DIAGNOSIS — M5137 Other intervertebral disc degeneration, lumbosacral region: Secondary | ICD-10-CM | POA: Diagnosis not present

## 2016-11-18 DIAGNOSIS — M4004 Postural kyphosis, thoracic region: Secondary | ICD-10-CM | POA: Diagnosis not present

## 2016-11-18 DIAGNOSIS — M9901 Segmental and somatic dysfunction of cervical region: Secondary | ICD-10-CM | POA: Diagnosis not present

## 2016-11-18 DIAGNOSIS — M50322 Other cervical disc degeneration at C5-C6 level: Secondary | ICD-10-CM | POA: Diagnosis not present

## 2016-11-18 DIAGNOSIS — M9904 Segmental and somatic dysfunction of sacral region: Secondary | ICD-10-CM | POA: Diagnosis not present

## 2016-11-18 DIAGNOSIS — M25551 Pain in right hip: Secondary | ICD-10-CM | POA: Diagnosis not present

## 2016-11-19 DIAGNOSIS — M25551 Pain in right hip: Secondary | ICD-10-CM | POA: Diagnosis not present

## 2016-11-19 DIAGNOSIS — M9902 Segmental and somatic dysfunction of thoracic region: Secondary | ICD-10-CM | POA: Diagnosis not present

## 2016-11-19 DIAGNOSIS — M4004 Postural kyphosis, thoracic region: Secondary | ICD-10-CM | POA: Diagnosis not present

## 2016-11-19 DIAGNOSIS — M9903 Segmental and somatic dysfunction of lumbar region: Secondary | ICD-10-CM | POA: Diagnosis not present

## 2016-11-19 DIAGNOSIS — M9905 Segmental and somatic dysfunction of pelvic region: Secondary | ICD-10-CM | POA: Diagnosis not present

## 2016-11-19 DIAGNOSIS — M25552 Pain in left hip: Secondary | ICD-10-CM | POA: Diagnosis not present

## 2016-11-19 DIAGNOSIS — M9904 Segmental and somatic dysfunction of sacral region: Secondary | ICD-10-CM | POA: Diagnosis not present

## 2016-11-19 DIAGNOSIS — M5137 Other intervertebral disc degeneration, lumbosacral region: Secondary | ICD-10-CM | POA: Diagnosis not present

## 2016-11-19 DIAGNOSIS — M9901 Segmental and somatic dysfunction of cervical region: Secondary | ICD-10-CM | POA: Diagnosis not present

## 2016-11-19 DIAGNOSIS — M50322 Other cervical disc degeneration at C5-C6 level: Secondary | ICD-10-CM | POA: Diagnosis not present

## 2016-11-23 DIAGNOSIS — M25551 Pain in right hip: Secondary | ICD-10-CM | POA: Diagnosis not present

## 2016-11-23 DIAGNOSIS — M4004 Postural kyphosis, thoracic region: Secondary | ICD-10-CM | POA: Diagnosis not present

## 2016-11-23 DIAGNOSIS — M9903 Segmental and somatic dysfunction of lumbar region: Secondary | ICD-10-CM | POA: Diagnosis not present

## 2016-11-23 DIAGNOSIS — M50322 Other cervical disc degeneration at C5-C6 level: Secondary | ICD-10-CM | POA: Diagnosis not present

## 2016-11-23 DIAGNOSIS — M5137 Other intervertebral disc degeneration, lumbosacral region: Secondary | ICD-10-CM | POA: Diagnosis not present

## 2016-11-23 DIAGNOSIS — M9902 Segmental and somatic dysfunction of thoracic region: Secondary | ICD-10-CM | POA: Diagnosis not present

## 2016-11-23 DIAGNOSIS — M9905 Segmental and somatic dysfunction of pelvic region: Secondary | ICD-10-CM | POA: Diagnosis not present

## 2016-11-23 DIAGNOSIS — M25552 Pain in left hip: Secondary | ICD-10-CM | POA: Diagnosis not present

## 2016-11-23 DIAGNOSIS — M9904 Segmental and somatic dysfunction of sacral region: Secondary | ICD-10-CM | POA: Diagnosis not present

## 2016-11-23 DIAGNOSIS — M9901 Segmental and somatic dysfunction of cervical region: Secondary | ICD-10-CM | POA: Diagnosis not present

## 2016-11-24 DIAGNOSIS — M25552 Pain in left hip: Secondary | ICD-10-CM | POA: Diagnosis not present

## 2016-11-24 DIAGNOSIS — M50322 Other cervical disc degeneration at C5-C6 level: Secondary | ICD-10-CM | POA: Diagnosis not present

## 2016-11-24 DIAGNOSIS — M9904 Segmental and somatic dysfunction of sacral region: Secondary | ICD-10-CM | POA: Diagnosis not present

## 2016-11-24 DIAGNOSIS — M4004 Postural kyphosis, thoracic region: Secondary | ICD-10-CM | POA: Diagnosis not present

## 2016-11-24 DIAGNOSIS — M9903 Segmental and somatic dysfunction of lumbar region: Secondary | ICD-10-CM | POA: Diagnosis not present

## 2016-11-24 DIAGNOSIS — M5137 Other intervertebral disc degeneration, lumbosacral region: Secondary | ICD-10-CM | POA: Diagnosis not present

## 2016-11-24 DIAGNOSIS — M9901 Segmental and somatic dysfunction of cervical region: Secondary | ICD-10-CM | POA: Diagnosis not present

## 2016-11-24 DIAGNOSIS — M9905 Segmental and somatic dysfunction of pelvic region: Secondary | ICD-10-CM | POA: Diagnosis not present

## 2016-11-24 DIAGNOSIS — M25551 Pain in right hip: Secondary | ICD-10-CM | POA: Diagnosis not present

## 2016-11-24 DIAGNOSIS — M9902 Segmental and somatic dysfunction of thoracic region: Secondary | ICD-10-CM | POA: Diagnosis not present

## 2016-11-26 DIAGNOSIS — M25551 Pain in right hip: Secondary | ICD-10-CM | POA: Diagnosis not present

## 2016-11-26 DIAGNOSIS — M9902 Segmental and somatic dysfunction of thoracic region: Secondary | ICD-10-CM | POA: Diagnosis not present

## 2016-11-26 DIAGNOSIS — M5137 Other intervertebral disc degeneration, lumbosacral region: Secondary | ICD-10-CM | POA: Diagnosis not present

## 2016-11-26 DIAGNOSIS — M4004 Postural kyphosis, thoracic region: Secondary | ICD-10-CM | POA: Diagnosis not present

## 2016-11-26 DIAGNOSIS — M25552 Pain in left hip: Secondary | ICD-10-CM | POA: Diagnosis not present

## 2016-11-26 DIAGNOSIS — M50322 Other cervical disc degeneration at C5-C6 level: Secondary | ICD-10-CM | POA: Diagnosis not present

## 2016-11-26 DIAGNOSIS — M9903 Segmental and somatic dysfunction of lumbar region: Secondary | ICD-10-CM | POA: Diagnosis not present

## 2016-11-26 DIAGNOSIS — M9901 Segmental and somatic dysfunction of cervical region: Secondary | ICD-10-CM | POA: Diagnosis not present

## 2016-11-26 DIAGNOSIS — M9904 Segmental and somatic dysfunction of sacral region: Secondary | ICD-10-CM | POA: Diagnosis not present

## 2016-11-26 DIAGNOSIS — M9905 Segmental and somatic dysfunction of pelvic region: Secondary | ICD-10-CM | POA: Diagnosis not present

## 2016-11-30 DIAGNOSIS — M9901 Segmental and somatic dysfunction of cervical region: Secondary | ICD-10-CM | POA: Diagnosis not present

## 2016-11-30 DIAGNOSIS — M5137 Other intervertebral disc degeneration, lumbosacral region: Secondary | ICD-10-CM | POA: Diagnosis not present

## 2016-11-30 DIAGNOSIS — M9902 Segmental and somatic dysfunction of thoracic region: Secondary | ICD-10-CM | POA: Diagnosis not present

## 2016-11-30 DIAGNOSIS — M25552 Pain in left hip: Secondary | ICD-10-CM | POA: Diagnosis not present

## 2016-11-30 DIAGNOSIS — M25551 Pain in right hip: Secondary | ICD-10-CM | POA: Diagnosis not present

## 2016-11-30 DIAGNOSIS — M4004 Postural kyphosis, thoracic region: Secondary | ICD-10-CM | POA: Diagnosis not present

## 2016-11-30 DIAGNOSIS — M9904 Segmental and somatic dysfunction of sacral region: Secondary | ICD-10-CM | POA: Diagnosis not present

## 2016-11-30 DIAGNOSIS — M50322 Other cervical disc degeneration at C5-C6 level: Secondary | ICD-10-CM | POA: Diagnosis not present

## 2016-11-30 DIAGNOSIS — M9905 Segmental and somatic dysfunction of pelvic region: Secondary | ICD-10-CM | POA: Diagnosis not present

## 2016-11-30 DIAGNOSIS — M9903 Segmental and somatic dysfunction of lumbar region: Secondary | ICD-10-CM | POA: Diagnosis not present

## 2016-12-01 DIAGNOSIS — M9902 Segmental and somatic dysfunction of thoracic region: Secondary | ICD-10-CM | POA: Diagnosis not present

## 2016-12-01 DIAGNOSIS — M5137 Other intervertebral disc degeneration, lumbosacral region: Secondary | ICD-10-CM | POA: Diagnosis not present

## 2016-12-01 DIAGNOSIS — M25551 Pain in right hip: Secondary | ICD-10-CM | POA: Diagnosis not present

## 2016-12-01 DIAGNOSIS — M9905 Segmental and somatic dysfunction of pelvic region: Secondary | ICD-10-CM | POA: Diagnosis not present

## 2016-12-01 DIAGNOSIS — M9903 Segmental and somatic dysfunction of lumbar region: Secondary | ICD-10-CM | POA: Diagnosis not present

## 2016-12-01 DIAGNOSIS — M25552 Pain in left hip: Secondary | ICD-10-CM | POA: Diagnosis not present

## 2016-12-01 DIAGNOSIS — M4004 Postural kyphosis, thoracic region: Secondary | ICD-10-CM | POA: Diagnosis not present

## 2016-12-01 DIAGNOSIS — M9901 Segmental and somatic dysfunction of cervical region: Secondary | ICD-10-CM | POA: Diagnosis not present

## 2016-12-01 DIAGNOSIS — M9904 Segmental and somatic dysfunction of sacral region: Secondary | ICD-10-CM | POA: Diagnosis not present

## 2016-12-01 DIAGNOSIS — M50322 Other cervical disc degeneration at C5-C6 level: Secondary | ICD-10-CM | POA: Diagnosis not present

## 2016-12-03 DIAGNOSIS — M9905 Segmental and somatic dysfunction of pelvic region: Secondary | ICD-10-CM | POA: Diagnosis not present

## 2016-12-03 DIAGNOSIS — M9902 Segmental and somatic dysfunction of thoracic region: Secondary | ICD-10-CM | POA: Diagnosis not present

## 2016-12-03 DIAGNOSIS — M9904 Segmental and somatic dysfunction of sacral region: Secondary | ICD-10-CM | POA: Diagnosis not present

## 2016-12-03 DIAGNOSIS — M9903 Segmental and somatic dysfunction of lumbar region: Secondary | ICD-10-CM | POA: Diagnosis not present

## 2016-12-03 DIAGNOSIS — M50322 Other cervical disc degeneration at C5-C6 level: Secondary | ICD-10-CM | POA: Diagnosis not present

## 2016-12-03 DIAGNOSIS — M5137 Other intervertebral disc degeneration, lumbosacral region: Secondary | ICD-10-CM | POA: Diagnosis not present

## 2016-12-03 DIAGNOSIS — M25551 Pain in right hip: Secondary | ICD-10-CM | POA: Diagnosis not present

## 2016-12-03 DIAGNOSIS — M4004 Postural kyphosis, thoracic region: Secondary | ICD-10-CM | POA: Diagnosis not present

## 2016-12-03 DIAGNOSIS — M25552 Pain in left hip: Secondary | ICD-10-CM | POA: Diagnosis not present

## 2016-12-03 DIAGNOSIS — M9901 Segmental and somatic dysfunction of cervical region: Secondary | ICD-10-CM | POA: Diagnosis not present

## 2016-12-07 DIAGNOSIS — M9901 Segmental and somatic dysfunction of cervical region: Secondary | ICD-10-CM | POA: Diagnosis not present

## 2016-12-07 DIAGNOSIS — M9904 Segmental and somatic dysfunction of sacral region: Secondary | ICD-10-CM | POA: Diagnosis not present

## 2016-12-07 DIAGNOSIS — M50322 Other cervical disc degeneration at C5-C6 level: Secondary | ICD-10-CM | POA: Diagnosis not present

## 2016-12-07 DIAGNOSIS — M25551 Pain in right hip: Secondary | ICD-10-CM | POA: Diagnosis not present

## 2016-12-07 DIAGNOSIS — M9903 Segmental and somatic dysfunction of lumbar region: Secondary | ICD-10-CM | POA: Diagnosis not present

## 2016-12-07 DIAGNOSIS — M4004 Postural kyphosis, thoracic region: Secondary | ICD-10-CM | POA: Diagnosis not present

## 2016-12-07 DIAGNOSIS — M5137 Other intervertebral disc degeneration, lumbosacral region: Secondary | ICD-10-CM | POA: Diagnosis not present

## 2016-12-07 DIAGNOSIS — M9905 Segmental and somatic dysfunction of pelvic region: Secondary | ICD-10-CM | POA: Diagnosis not present

## 2016-12-07 DIAGNOSIS — M9902 Segmental and somatic dysfunction of thoracic region: Secondary | ICD-10-CM | POA: Diagnosis not present

## 2016-12-07 DIAGNOSIS — M25552 Pain in left hip: Secondary | ICD-10-CM | POA: Diagnosis not present

## 2016-12-08 DIAGNOSIS — M9904 Segmental and somatic dysfunction of sacral region: Secondary | ICD-10-CM | POA: Diagnosis not present

## 2016-12-08 DIAGNOSIS — M25551 Pain in right hip: Secondary | ICD-10-CM | POA: Diagnosis not present

## 2016-12-08 DIAGNOSIS — M4004 Postural kyphosis, thoracic region: Secondary | ICD-10-CM | POA: Diagnosis not present

## 2016-12-08 DIAGNOSIS — M50322 Other cervical disc degeneration at C5-C6 level: Secondary | ICD-10-CM | POA: Diagnosis not present

## 2016-12-08 DIAGNOSIS — M9905 Segmental and somatic dysfunction of pelvic region: Secondary | ICD-10-CM | POA: Diagnosis not present

## 2016-12-08 DIAGNOSIS — M9902 Segmental and somatic dysfunction of thoracic region: Secondary | ICD-10-CM | POA: Diagnosis not present

## 2016-12-08 DIAGNOSIS — M9903 Segmental and somatic dysfunction of lumbar region: Secondary | ICD-10-CM | POA: Diagnosis not present

## 2016-12-08 DIAGNOSIS — M9901 Segmental and somatic dysfunction of cervical region: Secondary | ICD-10-CM | POA: Diagnosis not present

## 2016-12-08 DIAGNOSIS — M25552 Pain in left hip: Secondary | ICD-10-CM | POA: Diagnosis not present

## 2016-12-08 DIAGNOSIS — M5137 Other intervertebral disc degeneration, lumbosacral region: Secondary | ICD-10-CM | POA: Diagnosis not present

## 2016-12-10 DIAGNOSIS — M25552 Pain in left hip: Secondary | ICD-10-CM | POA: Diagnosis not present

## 2016-12-10 DIAGNOSIS — M50322 Other cervical disc degeneration at C5-C6 level: Secondary | ICD-10-CM | POA: Diagnosis not present

## 2016-12-10 DIAGNOSIS — M9905 Segmental and somatic dysfunction of pelvic region: Secondary | ICD-10-CM | POA: Diagnosis not present

## 2016-12-10 DIAGNOSIS — M4004 Postural kyphosis, thoracic region: Secondary | ICD-10-CM | POA: Diagnosis not present

## 2016-12-10 DIAGNOSIS — M9902 Segmental and somatic dysfunction of thoracic region: Secondary | ICD-10-CM | POA: Diagnosis not present

## 2016-12-10 DIAGNOSIS — M9903 Segmental and somatic dysfunction of lumbar region: Secondary | ICD-10-CM | POA: Diagnosis not present

## 2016-12-10 DIAGNOSIS — M25551 Pain in right hip: Secondary | ICD-10-CM | POA: Diagnosis not present

## 2016-12-10 DIAGNOSIS — M9901 Segmental and somatic dysfunction of cervical region: Secondary | ICD-10-CM | POA: Diagnosis not present

## 2016-12-10 DIAGNOSIS — M9904 Segmental and somatic dysfunction of sacral region: Secondary | ICD-10-CM | POA: Diagnosis not present

## 2016-12-10 DIAGNOSIS — M5137 Other intervertebral disc degeneration, lumbosacral region: Secondary | ICD-10-CM | POA: Diagnosis not present

## 2016-12-15 ENCOUNTER — Ambulatory Visit (INDEPENDENT_AMBULATORY_CARE_PROVIDER_SITE_OTHER): Payer: Medicare Other | Admitting: Family Medicine

## 2016-12-15 ENCOUNTER — Encounter: Payer: Self-pay | Admitting: Family Medicine

## 2016-12-15 VITALS — BP 122/62 | HR 68 | Temp 97.6°F | Resp 18 | Ht 62.0 in | Wt 179.0 lb

## 2016-12-15 DIAGNOSIS — H8113 Benign paroxysmal vertigo, bilateral: Secondary | ICD-10-CM

## 2016-12-15 DIAGNOSIS — R05 Cough: Secondary | ICD-10-CM | POA: Diagnosis not present

## 2016-12-15 DIAGNOSIS — R053 Chronic cough: Secondary | ICD-10-CM

## 2016-12-15 MED ORDER — MECLIZINE HCL 25 MG PO TABS: 25.0000 mg | ORAL_TABLET | Freq: Three times a day (TID) | ORAL | 0 refills | Status: DC | PRN

## 2016-12-15 NOTE — Progress Notes (Signed)
Subjective:    Patient ID: [name], female    DOB: Mar 08, 1946, 71 y.o.   MRN: [id number]  HPI One day history of dizziness and the room spinning when she changes positions.  Started last night abruptly. She stood up and she felt like she was staggering into walls. When she holds still, she does not feel dizzy. However simply turning her head or trigger the dizziness. Right now she feels better than she did this morning. However this is up from a Dix-Hallpike maneuver, the dizziness returned and it was also visible nystagmus which was horizontal. However cranial nerves II through XII are grossly intact muscle strength 5 over 5 equal and symmetric in the upper and lowers remedies and a normal cerebellar exam with no obvious ataxia. History is consistent with BPPV. Patient also reports a cough for more than one year. It comes and goes. It is nonproductive. It is worse when she is lying down. She does report postnasal drip. She also has chronic acid reflux in addition to asthma. She denies any hemoptysis. She denies any chest pain. She denies any night sweats or weight loss or fevers or pleurisy. Past Medical History:  Diagnosis Date  . Allergy history unknown   . Arthritis   . Asthma   . Breast cancer ([place]) 1999; 2008   hx of bilateral breast cancer  . Breast cancer, left breast ([place]) 10/27/2012   3.2 cm ER positive, Her-2 negative, 1 node positive S/P mastectomy/TTRAM reconstruction 4/08  . Breast cancer, right breast ([place]) 10/27/2012   3.2 cm ER/PR positive, 1 node positive, Her-2 negative Rx w mastectomy, AC -T chemo, followed by aromatase inhibitor x 5 years dx 4/08  . Cancer [facility])    breast CA  . Diabetes ([place])   . DM2 (diabetes mellitus, type 2) ([place])   . Elevated cholesterol   . GERD (gastroesophageal reflux disease)   . Hiatal hernia   . Hypertension   . LBBB (left bundle branch block) 10/27/2012  . Leg cramps   . NICM (nonischemic cardiomyopathy) ([place])    a. Echo:  06/14/12:  Poor endocardial definition, possible septal HK, EF 50-55%, normal wall motion, mild LAE ;  b.  [facility] 9/13:  normal cors, EF 35-40%,  c. Echo 7/14: EF 50-55%, mild LAE  . Other fatigue 01/01/2015   Past Surgical History:  Procedure Laterality Date  . DILATION AND CURETTAGE OF UTERUS    . KNEE SURGERY     left  . LEFT HEART CATHETERIZATION WITH CORONARY ANGIOGRAM N/A 06/15/2012   Procedure: LEFT HEART CATHETERIZATION WITH CORONARY ANGIOGRAM;  Surgeon: Wellington Hampshire, MD;  Location: [facility] CATH LAB;  Service: Cardiovascular;  Laterality: N/A;  . MASTECTOMY     bilateral   Current Outpatient Prescriptions on File Prior to Visit  Medication Sig Dispense Refill  . atorvastatin (LIPITOR) 20 MG tablet TAKE 2 TABLETS (40 MG TOTAL) BY MOUTH EVERY EVENING. 180 tablet 0  . Blood Glucose Monitoring Suppl (ONE TOUCH ULTRA 2) W/DEVICE KIT Check FBS qam - Dx:250.00 and she will need lancets (1 box) , strips (1 bottle = 100) and controls also thanks 1 each 0  . Calcium Carbonate-Vitamin D (CALCIUM-VITAMIN D) 500-200 MG-UNIT per tablet Take 1 tablet by mouth 2 (two) times daily with a meal.     . carvedilol (COREG) 12.5 MG tablet Take 1.5 tablets (18.75 mg total) by mouth 2 (two) times daily with a meal. 270 tablet 3  . cetirizine (ZYRTEC) 10 MG tablet  Take 10 mg by mouth every evening.    . cyanocobalamin 500 MCG tablet Take 500 mcg by mouth daily. 10/25/12--Pt not sure of dose    . DEXILANT 60 MG capsule TAKE ONE CAPSULE BY MOUTH EVERY DAY 90 capsule 3  . ELIQUIS 5 MG TABS tablet TAKE 1 TABLET (5 MG TOTAL) BY MOUTH 2 (TWO) TIMES DAILY. 180 tablet 3  . fluticasone (FLONASE) 50 MCG/ACT nasal spray USE 2 SPRAYS IN EACH NOSTRIL DAILY FOR 3 MONTHS 48 g 0  . furosemide (LASIX) 20 MG tablet TAKE 1 TABLET (20 MG TOTAL) BY MOUTH DAILY. 90 tablet 3  . glucose blood test strip accu check comfort curve Checks FBS qam 100 each 5  . nitroGLYCERIN (NITROSTAT) 0.4 MG SL tablet Place 1 tablet (0.4 mg total) under the tongue  every 5 (five) minutes as needed for chest pain. 25 tablet prn  . spironolactone (ALDACTONE) 25 MG tablet TAKE 1 TABLET (25 MG TOTAL) BY MOUTH DAILY. 90 tablet 3  . valsartan (DIOVAN) 160 MG tablet TAKE 1 AND 1/2 TABLETS (TOTAL 240MG) DAILY 135 tablet 3  . vitamin C (ASCORBIC ACID) 500 MG tablet Take 500 mg by mouth 2 (two) times daily.      . WELCHOL 625 MG tablet TAKE 3 TABLETS (1,875 MG TOTAL) BY MOUTH 2 (TWO) TIMES DAILY WITH A MEAL. 540 tablet 3  . zafirlukast (ACCOLATE) 20 MG tablet TAKE 1 TABLET (20 MG TOTAL) BY MOUTH 2 (TWO) TIMES DAILY. 180 tablet 4   No current facility-administered medications on file prior to visit.    Allergies  Allergen Reactions  . Adhesive [Tape] Other (See Comments)    Skin turns red   . Diphenhydramine Other (See Comments)    Pt feels wired   . Vicodin [Hydrocodone-Acetaminophen] Nausea And Vomiting   Social History   Social History  . Marital status: Married    Spouse name: N/A  . Number of children: 2  . Years of education: N/A   Occupational History  . retired Retired    post Furniture conservator/restorer   Social History Main Topics  . Smoking status: Never Smoker  . Smokeless tobacco: Never Used  . Alcohol use No  . Drug use: No  . Sexual activity: No   Other Topics Concern  . Not on file   Social History Narrative  . No narrative on file      Review of Systems  Neurological: Positive for dizziness.  All other systems reviewed and are negative.      Objective:   Physical Exam  Constitutional: She is oriented to person, place, and time. She appears well-developed and well-nourished. No distress.  HENT:  Right Ear: External ear normal.  Left Ear: External ear normal.  Nose: Nose normal.  Mouth/Throat: Oropharynx is clear and moist. No oropharyngeal exudate.  Eyes: Conjunctivae and EOM are normal. Pupils are equal, round, and reactive to light.  Neck: No JVD present.  Cardiovascular: Normal rate, regular rhythm and normal  heart sounds.   Pulmonary/Chest: Effort normal and breath sounds normal. No respiratory distress. She has no wheezes. She has no rales.  Abdominal: Soft. Bowel sounds are normal. She exhibits no distension and no mass. There is no tenderness. There is no rebound and no guarding.  Musculoskeletal: She exhibits no edema.  Neurological: She is alert and oriented to person, place, and time. She has normal reflexes. She displays normal reflexes. No cranial nerve deficit. She exhibits normal muscle tone. Coordination normal.  Skin: She  is not diaphoretic.  Vitals reviewed.         Assessment & Plan:  BPPV (benign paroxysmal positional vertigo), bilateral - Plan: meclizine (ANTIVERT) 25 MG tablet  Chronic cough  Begin meclizine 25 mg every 8 hours as needed for vertigo. Recommended tincture of time. Symptoms should gradually improve over the next week. I believe her cough is likely due to upper airway cough syndrome secondary to postnasal drip coupled with acid reflux. She's not on an ACE inhibitor. Add Zantac 150 mg by mouth daily at bedtime in addition to Flonase 2 sprays each nostril daily and recheck in 2-3 weeks. If the cough is no better, proceed with imaging of the chest.

## 2016-12-17 DIAGNOSIS — M25552 Pain in left hip: Secondary | ICD-10-CM | POA: Diagnosis not present

## 2016-12-17 DIAGNOSIS — M5137 Other intervertebral disc degeneration, lumbosacral region: Secondary | ICD-10-CM | POA: Diagnosis not present

## 2016-12-17 DIAGNOSIS — M50322 Other cervical disc degeneration at C5-C6 level: Secondary | ICD-10-CM | POA: Diagnosis not present

## 2016-12-17 DIAGNOSIS — M4004 Postural kyphosis, thoracic region: Secondary | ICD-10-CM | POA: Diagnosis not present

## 2016-12-17 DIAGNOSIS — M9901 Segmental and somatic dysfunction of cervical region: Secondary | ICD-10-CM | POA: Diagnosis not present

## 2016-12-17 DIAGNOSIS — M9904 Segmental and somatic dysfunction of sacral region: Secondary | ICD-10-CM | POA: Diagnosis not present

## 2016-12-17 DIAGNOSIS — M9902 Segmental and somatic dysfunction of thoracic region: Secondary | ICD-10-CM | POA: Diagnosis not present

## 2016-12-17 DIAGNOSIS — M9903 Segmental and somatic dysfunction of lumbar region: Secondary | ICD-10-CM | POA: Diagnosis not present

## 2016-12-17 DIAGNOSIS — M9905 Segmental and somatic dysfunction of pelvic region: Secondary | ICD-10-CM | POA: Diagnosis not present

## 2016-12-17 DIAGNOSIS — M25551 Pain in right hip: Secondary | ICD-10-CM | POA: Diagnosis not present

## 2016-12-22 DIAGNOSIS — M9902 Segmental and somatic dysfunction of thoracic region: Secondary | ICD-10-CM | POA: Diagnosis not present

## 2016-12-22 DIAGNOSIS — M25551 Pain in right hip: Secondary | ICD-10-CM | POA: Diagnosis not present

## 2016-12-22 DIAGNOSIS — M9904 Segmental and somatic dysfunction of sacral region: Secondary | ICD-10-CM | POA: Diagnosis not present

## 2016-12-22 DIAGNOSIS — M25552 Pain in left hip: Secondary | ICD-10-CM | POA: Diagnosis not present

## 2016-12-22 DIAGNOSIS — M50322 Other cervical disc degeneration at C5-C6 level: Secondary | ICD-10-CM | POA: Diagnosis not present

## 2016-12-22 DIAGNOSIS — M9903 Segmental and somatic dysfunction of lumbar region: Secondary | ICD-10-CM | POA: Diagnosis not present

## 2016-12-22 DIAGNOSIS — M9901 Segmental and somatic dysfunction of cervical region: Secondary | ICD-10-CM | POA: Diagnosis not present

## 2016-12-22 DIAGNOSIS — M5137 Other intervertebral disc degeneration, lumbosacral region: Secondary | ICD-10-CM | POA: Diagnosis not present

## 2016-12-22 DIAGNOSIS — M4004 Postural kyphosis, thoracic region: Secondary | ICD-10-CM | POA: Diagnosis not present

## 2016-12-22 DIAGNOSIS — M9905 Segmental and somatic dysfunction of pelvic region: Secondary | ICD-10-CM | POA: Diagnosis not present

## 2016-12-29 DIAGNOSIS — M9904 Segmental and somatic dysfunction of sacral region: Secondary | ICD-10-CM | POA: Diagnosis not present

## 2016-12-29 DIAGNOSIS — M25552 Pain in left hip: Secondary | ICD-10-CM | POA: Diagnosis not present

## 2016-12-29 DIAGNOSIS — M9902 Segmental and somatic dysfunction of thoracic region: Secondary | ICD-10-CM | POA: Diagnosis not present

## 2016-12-29 DIAGNOSIS — M5137 Other intervertebral disc degeneration, lumbosacral region: Secondary | ICD-10-CM | POA: Diagnosis not present

## 2016-12-29 DIAGNOSIS — M9905 Segmental and somatic dysfunction of pelvic region: Secondary | ICD-10-CM | POA: Diagnosis not present

## 2016-12-29 DIAGNOSIS — M25551 Pain in right hip: Secondary | ICD-10-CM | POA: Diagnosis not present

## 2016-12-29 DIAGNOSIS — M4004 Postural kyphosis, thoracic region: Secondary | ICD-10-CM | POA: Diagnosis not present

## 2016-12-29 DIAGNOSIS — M9901 Segmental and somatic dysfunction of cervical region: Secondary | ICD-10-CM | POA: Diagnosis not present

## 2016-12-29 DIAGNOSIS — M50322 Other cervical disc degeneration at C5-C6 level: Secondary | ICD-10-CM | POA: Diagnosis not present

## 2016-12-29 DIAGNOSIS — M9903 Segmental and somatic dysfunction of lumbar region: Secondary | ICD-10-CM | POA: Diagnosis not present

## 2016-12-31 DIAGNOSIS — M9903 Segmental and somatic dysfunction of lumbar region: Secondary | ICD-10-CM | POA: Diagnosis not present

## 2016-12-31 DIAGNOSIS — M4004 Postural kyphosis, thoracic region: Secondary | ICD-10-CM | POA: Diagnosis not present

## 2016-12-31 DIAGNOSIS — M25551 Pain in right hip: Secondary | ICD-10-CM | POA: Diagnosis not present

## 2016-12-31 DIAGNOSIS — M9902 Segmental and somatic dysfunction of thoracic region: Secondary | ICD-10-CM | POA: Diagnosis not present

## 2016-12-31 DIAGNOSIS — M25552 Pain in left hip: Secondary | ICD-10-CM | POA: Diagnosis not present

## 2016-12-31 DIAGNOSIS — M50322 Other cervical disc degeneration at C5-C6 level: Secondary | ICD-10-CM | POA: Diagnosis not present

## 2016-12-31 DIAGNOSIS — M9901 Segmental and somatic dysfunction of cervical region: Secondary | ICD-10-CM | POA: Diagnosis not present

## 2016-12-31 DIAGNOSIS — M9904 Segmental and somatic dysfunction of sacral region: Secondary | ICD-10-CM | POA: Diagnosis not present

## 2016-12-31 DIAGNOSIS — M9905 Segmental and somatic dysfunction of pelvic region: Secondary | ICD-10-CM | POA: Diagnosis not present

## 2016-12-31 DIAGNOSIS — M5137 Other intervertebral disc degeneration, lumbosacral region: Secondary | ICD-10-CM | POA: Diagnosis not present

## 2017-01-02 ENCOUNTER — Other Ambulatory Visit: Payer: Self-pay | Admitting: Family Medicine

## 2017-01-07 ENCOUNTER — Encounter: Payer: Self-pay | Admitting: Family Medicine

## 2017-01-07 ENCOUNTER — Ambulatory Visit (INDEPENDENT_AMBULATORY_CARE_PROVIDER_SITE_OTHER): Payer: Medicare Other | Admitting: Family Medicine

## 2017-01-07 VITALS — BP 100/62 | HR 75 | Temp 97.4°F | Resp 18 | Ht 62.0 in | Wt 179.0 lb

## 2017-01-07 DIAGNOSIS — G8929 Other chronic pain: Secondary | ICD-10-CM | POA: Diagnosis not present

## 2017-01-07 DIAGNOSIS — M25562 Pain in left knee: Secondary | ICD-10-CM

## 2017-01-07 NOTE — Progress Notes (Signed)
  Subjective:    Patient ID: [name], female    DOB: 01/14/1946, 70 y.o.   MRN: [id number]  HPI10/10/16 Patient presents with left knee pain. She has osteoarthritis in the left knee and underwent arthroscopic surgery  For the same diagnosis in 2006. She received steroid injections in her left knee periodically over the last 10 years due to pain. The pain is completely the same as it has been over the last 10 years. It is worse going up or coming down steps. It is worse with walking. She also has a mild knee effusion. There is no erythema or warmth around the knee. She denies any falls or injuries recently. She denies any night sweats or bone pain anywhere else in the body.  At that time,my plan was: Patient has osteoarthritis in the left knee. Using sterile technique, I injected the left knee with a mixture of 2 mL of lidocaine, 2 mL of Marcaine, and 2 mL of 40 mg per mL Kenalog. The patient tolerated the procedure well with no complications. Recheck in one week if no better or sooner if worse.  01/21/16 Patient is requesting a repeat injection in her left knee. The previous steroid injection lasted approximately 6 months. The pain just recently returned. She continues to have pain going down steps which is worsened with walking. She also has an effusion in her left knee because of stiffness and pain.  At that time, my plan was: Patient has osteoarthritis in the left knee. Using sterile technique, I injected the left knee with a mixture of 2 mL of lidocaine, 2 mL of Marcaine, and 2 mL of 40 mg per mL Kenalog. The patient tolerated the procedure well with no complications. Recheck in one week if no better or sooner if worse.  07/20/16 The patient's left knee is hurting her again. She has been painting her house. She's been climbing up and down ladders. She is now having pain worse over the medial compartment of the left knee but also the lateral compartment. She is requesting a repeat cortisone  injection.  01/07/17 It is been approximately 6 months since her last cortisone injection. She states the pain in her left knee has returned. She reports pain with range of motion, pain with standing for prolonged periods of time, pain with walking. As long as she is lying still in bed or sitting still, she has no pain. She reports tenderness over the medial and lateral joint line. She has a small effusion. She denies any erythema or warmth. She denies any falls or recent injury. She is requesting a repeat cortisone injection Past Medical History:  Diagnosis Date  . Allergy history unknown   . Arthritis   . Asthma   . Breast cancer ([facility]) 1999; 2008   hx of bilateral breast cancer  . Breast cancer, left breast ([facility]) 10/27/2012   3.2 cm ER positive, Her-2 negative, 1 node positive S/P mastectomy/TTRAM reconstruction 4/08  . Breast cancer, right breast ([facility]) 10/27/2012   3.2 cm ER/PR positive, 1 node positive, Her-2 negative Rx w mastectomy, AC -T chemo, followed by aromatase inhibitor x 5 years dx 4/08  . Cancer ([facility])    breast CA  . Diabetes ([facility])   . DM2 (diabetes mellitus, type 2) ([facility])   . Elevated cholesterol   . GERD (gastroesophageal reflux disease)   . Hiatal hernia   . Hypertension   . LBBB (left bundle branch block) 10/27/2012  . Leg cramps   .   NICM (nonischemic cardiomyopathy) ([place])    a. Echo:  06/14/12: Poor endocardial definition, possible septal HK, EF 50-55%, normal wall motion, mild LAE ;  b.  [facility] 9/13:  normal cors, EF 35-40%,  c. Echo 7/14: EF 50-55%, mild LAE  . Other fatigue 01/01/2015   Past Surgical History:  Procedure Laterality Date  . DILATION AND CURETTAGE OF UTERUS    . KNEE SURGERY     left  . LEFT HEART CATHETERIZATION WITH CORONARY ANGIOGRAM N/A 06/15/2012   Procedure: LEFT HEART CATHETERIZATION WITH CORONARY ANGIOGRAM;  Surgeon: Wellington Hampshire, MD;  Location: [place] CATH LAB;  Service: Cardiovascular;  Laterality: N/A;  . MASTECTOMY     bilateral   Current  Outpatient Prescriptions on File Prior to Visit  Medication Sig Dispense Refill  . atorvastatin (LIPITOR) 20 MG tablet TAKE 2 TABLETS (40 MG TOTAL) BY MOUTH EVERY EVENING. 180 tablet 2  . Blood Glucose Monitoring Suppl (ONE TOUCH ULTRA 2) W/DEVICE KIT Check FBS qam - Dx:250.00 and she will need lancets (1 box) , strips (1 bottle = 100) and controls also thanks 1 each 0  . Calcium Carbonate-Vitamin D (CALCIUM-VITAMIN D) 500-200 MG-UNIT per tablet Take 1 tablet by mouth 2 (two) times daily with a meal.     . carvedilol (COREG) 12.5 MG tablet Take 1.5 tablets (18.75 mg total) by mouth 2 (two) times daily with a meal. 270 tablet 3  . cetirizine (ZYRTEC) 10 MG tablet Take 10 mg by mouth every evening.    Marland Kitchen DEXILANT 60 MG capsule TAKE ONE CAPSULE BY MOUTH EVERY DAY 90 capsule 3  . ELIQUIS 5 MG TABS tablet TAKE 1 TABLET (5 MG TOTAL) BY MOUTH 2 (TWO) TIMES DAILY. 180 tablet 3  . fluticasone (FLONASE) 50 MCG/ACT nasal spray USE 2 SPRAYS IN EACH NOSTRIL DAILY FOR 3 MONTHS 48 g 0  . furosemide (LASIX) 20 MG tablet TAKE 1 TABLET (20 MG TOTAL) BY MOUTH DAILY. 90 tablet 3  . glucose blood test strip accu check comfort curve Checks FBS qam 100 each 5  . meclizine (ANTIVERT) 25 MG tablet Take 1 tablet (25 mg total) by mouth 3 (three) times daily as needed for dizziness. 30 tablet 0  . spironolactone (ALDACTONE) 25 MG tablet TAKE 1 TABLET (25 MG TOTAL) BY MOUTH DAILY. 90 tablet 3  . valsartan (DIOVAN) 160 MG tablet TAKE 1 AND 1/2 TABLETS (TOTAL 240MG) DAILY 135 tablet 3  . vitamin C (ASCORBIC ACID) 500 MG tablet Take 500 mg by mouth 2 (two) times daily.      . WELCHOL 625 MG tablet TAKE 3 TABLETS (1,875 MG TOTAL) BY MOUTH 2 (TWO) TIMES DAILY WITH A MEAL. 540 tablet 3  . zafirlukast (ACCOLATE) 20 MG tablet TAKE 1 TABLET (20 MG TOTAL) BY MOUTH 2 (TWO) TIMES DAILY. 180 tablet 4   No current facility-administered medications on file prior to visit.    Allergies  Allergen Reactions  . Adhesive [Tape] Other (See  Comments)    Skin turns red   . Diphenhydramine Other (See Comments)    Pt feels wired   . Vicodin [Hydrocodone-Acetaminophen] Nausea And Vomiting   Social History   Social History  . Marital status: Married    Spouse name: N/A  . Number of children: 2  . Years of education: N/A   Occupational History  . retired Retired    post Furniture conservator/restorer   Social History Main Topics  . Smoking status: Never Smoker  . Smokeless tobacco: Never Used  .  Alcohol use No  . Drug use: No  . Sexual activity: No   Other Topics Concern  . Not on file   Social History Narrative  . No narrative on file      Review of Systems  All other systems reviewed and are negative.      Objective:   Physical Exam  Constitutional: She appears well-developed and well-nourished.  Cardiovascular: Normal rate.   Pulmonary/Chest: Effort normal and breath sounds normal. No respiratory distress. She has no wheezes.  Musculoskeletal:       Left knee: She exhibits decreased range of motion, swelling and effusion. She exhibits no LCL laxity, normal meniscus and no MCL laxity. Tenderness found. Medial joint line and lateral joint line tenderness noted. No MCL and no LCL tenderness noted.  Vitals reviewed.         Assessment & Plan:  Chronic pain of left knee    Patient has osteoarthritis in the left knee. Using sterile technique, I injected the left knee with a mixture of 2 mL of lidocaine, 2 mL of Marcaine, and 2 mL of 40 mg per mL Kenalog. The patient tolerated the procedure well with no complications. Recheck in one week if no better or sooner if worse.  

## 2017-01-14 DIAGNOSIS — H04123 Dry eye syndrome of bilateral lacrimal glands: Secondary | ICD-10-CM | POA: Diagnosis not present

## 2017-01-14 DIAGNOSIS — H2511 Age-related nuclear cataract, right eye: Secondary | ICD-10-CM | POA: Diagnosis not present

## 2017-01-14 DIAGNOSIS — H43812 Vitreous degeneration, left eye: Secondary | ICD-10-CM | POA: Diagnosis not present

## 2017-01-14 DIAGNOSIS — H02831 Dermatochalasis of right upper eyelid: Secondary | ICD-10-CM | POA: Diagnosis not present

## 2017-01-14 DIAGNOSIS — H16143 Punctate keratitis, bilateral: Secondary | ICD-10-CM | POA: Diagnosis not present

## 2017-01-14 DIAGNOSIS — E119 Type 2 diabetes mellitus without complications: Secondary | ICD-10-CM | POA: Diagnosis not present

## 2017-01-14 DIAGNOSIS — H25812 Combined forms of age-related cataract, left eye: Secondary | ICD-10-CM | POA: Diagnosis not present

## 2017-01-14 DIAGNOSIS — H1851 Endothelial corneal dystrophy: Secondary | ICD-10-CM | POA: Diagnosis not present

## 2017-01-14 DIAGNOSIS — H02834 Dermatochalasis of left upper eyelid: Secondary | ICD-10-CM | POA: Diagnosis not present

## 2017-01-14 LAB — HM DIABETES EYE EXAM

## 2017-01-22 ENCOUNTER — Encounter: Payer: Self-pay | Admitting: Family Medicine

## 2017-01-28 ENCOUNTER — Encounter: Payer: Self-pay | Admitting: Family Medicine

## 2017-01-28 ENCOUNTER — Ambulatory Visit (INDEPENDENT_AMBULATORY_CARE_PROVIDER_SITE_OTHER): Payer: Medicare Other | Admitting: Family Medicine

## 2017-01-28 VITALS — BP 110/60 | HR 68 | Temp 98.0°F | Resp 16 | Ht 62.0 in | Wt 178.0 lb

## 2017-01-28 DIAGNOSIS — I428 Other cardiomyopathies: Secondary | ICD-10-CM

## 2017-01-28 DIAGNOSIS — I480 Paroxysmal atrial fibrillation: Secondary | ICD-10-CM

## 2017-01-28 DIAGNOSIS — Z23 Encounter for immunization: Secondary | ICD-10-CM | POA: Diagnosis not present

## 2017-01-28 DIAGNOSIS — J4530 Mild persistent asthma, uncomplicated: Secondary | ICD-10-CM | POA: Diagnosis not present

## 2017-01-28 DIAGNOSIS — R079 Chest pain, unspecified: Secondary | ICD-10-CM

## 2017-01-28 DIAGNOSIS — I10 Essential (primary) hypertension: Secondary | ICD-10-CM | POA: Diagnosis not present

## 2017-01-28 DIAGNOSIS — Z0000 Encounter for general adult medical examination without abnormal findings: Secondary | ICD-10-CM | POA: Diagnosis not present

## 2017-01-28 DIAGNOSIS — E785 Hyperlipidemia, unspecified: Secondary | ICD-10-CM | POA: Diagnosis not present

## 2017-01-28 DIAGNOSIS — Z13820 Encounter for screening for osteoporosis: Secondary | ICD-10-CM

## 2017-01-28 DIAGNOSIS — E119 Type 2 diabetes mellitus without complications: Secondary | ICD-10-CM | POA: Diagnosis not present

## 2017-01-28 LAB — CBC WITH DIFFERENTIAL/PLATELET
BASOS PCT: 0 %
Basophils Absolute: 0 cells/uL (ref 0–200)
EOS ABS: 71 {cells}/uL (ref 15–500)
Eosinophils Relative: 1 %
HCT: 38.3 % (ref 35.0–45.0)
Hemoglobin: 12.5 g/dL (ref 12.0–15.0)
LYMPHS PCT: 28 %
Lymphs Abs: 1988 cells/uL (ref 850–3900)
MCH: 31.2 pg (ref 27.0–33.0)
MCHC: 32.6 g/dL (ref 32.0–36.0)
MCV: 95.5 fL (ref 80.0–100.0)
MONOS PCT: 7 %
MPV: 9.1 fL (ref 7.5–12.5)
Monocytes Absolute: 497 cells/uL (ref 200–950)
NEUTROS PCT: 64 %
Neutro Abs: 4544 cells/uL (ref 1500–7800)
PLATELETS: 249 10*3/uL (ref 140–400)
RBC: 4.01 MIL/uL (ref 3.80–5.10)
RDW: 13.3 % (ref 11.0–15.0)
WBC: 7.1 10*3/uL (ref 3.8–10.8)

## 2017-01-28 LAB — COMPLETE METABOLIC PANEL WITH GFR
AG Ratio: 1.9 Ratio (ref 1.0–2.5)
ALK PHOS: 59 U/L (ref 33–130)
ALT: 15 U/L (ref 6–29)
AST: 12 U/L (ref 10–35)
Albumin: 4.1 g/dL (ref 3.6–5.1)
BILIRUBIN TOTAL: 0.4 mg/dL (ref 0.2–1.2)
BUN / CREAT RATIO: 20.9 ratio (ref 6–22)
BUN: 19 mg/dL (ref 7–25)
CO2: 23 mmol/L (ref 20–31)
CREATININE: 0.91 mg/dL (ref 0.60–0.93)
Calcium: 9.1 mg/dL (ref 8.6–10.4)
Chloride: 106 mmol/L (ref 98–110)
GFR, EST AFRICAN AMERICAN: 73 mL/min (ref >=60)
GFR, EST NON AFRICAN AMERICAN: 63 mL/min (ref >=60)
GLOBULIN: 2.2 g/dL (ref 1.9–3.7)
Glucose, Bld: 105 mg/dL — ABNORMAL HIGH (ref 70–99)
Potassium: 4.4 mmol/L (ref 3.5–5.3)
Sodium: 139 mmol/L (ref 135–146)
Total Protein: 6.3 g/dL (ref 6.1–8.1)

## 2017-01-28 LAB — LIPID PANEL
CHOLESTEROL: 112 mg/dL (ref <200)
HDL: 50 mg/dL — AB (ref >50)
LDL CALC: 48 mg/dL (ref <100)
Total CHOL/HDL Ratio: 2.2 Ratio (ref <5.0)
Triglycerides: 72 mg/dL (ref <150)
VLDL: 14 mg/dL (ref <30)

## 2017-01-28 NOTE — Addendum Note (Signed)
Addended by: Shary Decamp B on: 01/28/2017 10:07 AM   Modules accepted: Orders

## 2017-01-28 NOTE — Progress Notes (Signed)
Subjective:    Patient ID: [name], female    DOB: 09/24/1946, 71 y.o.   MRN: [id number]  HPI Patient has type 2 diabetes mellitus. She is currently on WelChol. She is on this medication because she cannot tolerate metformin. She also has chronic diarrhea. The WelChol has help manage her chronic diarrhea as well as reduce her sugars. She also has a history of cardiomyopathy/congestive heart failure.   Recently, the patient had an episode of chest pressure while trying to move a washing machine. EKG today in the office shows sinus rhythm with a left bundle branch block. This is chronic and unchanged from her EKG from October. Immunization History  Administered Date(s) Administered  . Hep A / Hep B 11/01/2014  . Hepatitis A, Ped/Adol-2 Dose 05/24/2014  . Influenza Split 08/03/2006, 07/08/2007, 07/10/2010, 06/07/2012, 06/19/2013  . Influenza, High Dose Seasonal PF 06/10/2016  . Influenza-Unspecified 07/18/2015  . Pneumococcal Conjugate-13 01/26/2014  . Pneumococcal Polysaccharide-23 07/08/2007, 10/05/2008  . Zoster 10/05/2008   Last colonoscopy was 2014. Due for DEXA.  Patient has a history of breast cancer. She has had bilateral mastectomies. Therefore this makes mammogram screening ineffective for this patient. She declines a mammogram. Her last Pneumovax 23 was at age 69. She declines a booster on this because she's had the shot twice. She would like her tetanus shot  Past Medical History:  Diagnosis Date  . Allergy history unknown   . Arthritis   . Asthma   . Breast cancer ([place]) 1999; 2008   hx of bilateral breast cancer  . Breast cancer, left breast ([place]) 10/27/2012   3.2 cm ER positive, Her-2 negative, 1 node positive S/P mastectomy/TTRAM reconstruction 4/08  . Breast cancer, right breast ([place]) 10/27/2012   3.2 cm ER/PR positive, 1 node positive, Her-2 negative Rx w mastectomy, AC -T chemo, followed by aromatase inhibitor x 5 years dx 4/08  . Cancer [facility])    breast CA  .  Diabetes ([place])   . DM2 (diabetes mellitus, type 2) ([place])   . Elevated cholesterol   . GERD (gastroesophageal reflux disease)   . Hiatal hernia   . Hypertension   . LBBB (left bundle branch block) 10/27/2012  . Leg cramps   . NICM (nonischemic cardiomyopathy) ([place])    a. Echo:  06/14/12: Poor endocardial definition, possible septal HK, EF 50-55%, normal wall motion, mild LAE ;  b.  [facility] 9/13:  normal cors, EF 35-40%,  c. Echo 7/14: EF 50-55%, mild LAE  . Other fatigue 01/01/2015   Past Surgical History:  Procedure Laterality Date  . DILATION AND CURETTAGE OF UTERUS    . KNEE SURGERY     left  . LEFT HEART CATHETERIZATION WITH CORONARY ANGIOGRAM N/A 06/15/2012   Procedure: LEFT HEART CATHETERIZATION WITH CORONARY ANGIOGRAM;  Surgeon: Wellington Hampshire, MD;  Location: [facility] CATH [facility];  Service: Cardiovascular;  Laterality: N/A;  . MASTECTOMY     bilateral   Current Outpatient Prescriptions on File Prior to Visit  Medication Sig Dispense Refill  . atorvastatin (LIPITOR) 20 MG tablet TAKE 2 TABLETS (40 MG TOTAL) BY MOUTH EVERY EVENING. 180 tablet 2  . Blood Glucose Monitoring Suppl (ONE TOUCH ULTRA 2) W/DEVICE KIT Check FBS qam - Dx:250.00 and she will need lancets (1 box) , strips (1 bottle = 100) and controls also thanks 1 each 0  . Calcium Carbonate-Vitamin D (CALCIUM-VITAMIN D) 500-200 MG-UNIT per tablet Take 1 tablet by mouth 2 (two) times daily with a meal.     .  carvedilol (COREG) 12.5 MG tablet Take 1.5 tablets (18.75 mg total) by mouth 2 (two) times daily with a meal. 270 tablet 3  . cetirizine (ZYRTEC) 10 MG tablet Take 10 mg by mouth every evening.    Marland Kitchen DEXILANT 60 MG capsule TAKE ONE CAPSULE BY MOUTH EVERY DAY 90 capsule 3  . ELIQUIS 5 MG TABS tablet TAKE 1 TABLET (5 MG TOTAL) BY MOUTH 2 (TWO) TIMES DAILY. 180 tablet 3  . fluticasone (FLONASE) 50 MCG/ACT nasal spray USE 2 SPRAYS IN EACH NOSTRIL DAILY FOR 3 MONTHS 48 g 0  . furosemide (LASIX) 20 MG tablet TAKE 1 TABLET (20 MG TOTAL) BY  MOUTH DAILY. 90 tablet 3  . glucose blood test strip accu check comfort curve Checks FBS qam 100 each 5  . meclizine (ANTIVERT) 25 MG tablet Take 1 tablet (25 mg total) by mouth 3 (three) times daily as needed for dizziness. 30 tablet 0  . spironolactone (ALDACTONE) 25 MG tablet TAKE 1 TABLET (25 MG TOTAL) BY MOUTH DAILY. 90 tablet 3  . valsartan (DIOVAN) 160 MG tablet TAKE 1 AND 1/2 TABLETS (TOTAL 240MG) DAILY 135 tablet 3  . vitamin C (ASCORBIC ACID) 500 MG tablet Take 500 mg by mouth 2 (two) times daily.      . WELCHOL 625 MG tablet TAKE 3 TABLETS (1,875 MG TOTAL) BY MOUTH 2 (TWO) TIMES DAILY WITH A MEAL. 540 tablet 3  . zafirlukast (ACCOLATE) 20 MG tablet TAKE 1 TABLET (20 MG TOTAL) BY MOUTH 2 (TWO) TIMES DAILY. 180 tablet 4   No current facility-administered medications on file prior to visit.    Allergies  Allergen Reactions  . Adhesive [Tape] Other (See Comments)    Skin turns red   . Diphenhydramine Other (See Comments)    Pt feels wired   . Vicodin [Hydrocodone-Acetaminophen] Nausea And Vomiting   Social History   Social History  . Marital status: Married    Spouse name: N/A  . Number of children: 2  . Years of education: N/A   Occupational History  . retired Retired    post Furniture conservator/restorer   Social History Main Topics  . Smoking status: Never Smoker  . Smokeless tobacco: Never Used  . Alcohol use No  . Drug use: No  . Sexual activity: No   Other Topics Concern  . Not on file   Social History Narrative  . No narrative on file   Family History  Problem Relation Age of Onset  . Asthma Maternal Grandmother   . Breast cancer Maternal Grandmother     dx. 67s  . Stomach cancer Maternal Grandmother 60    lots of stomach problems  . Heart attack Paternal Grandfather   . Other Daughter     cyst on ovary; unilateral oophorectomy  . Heart attack Brother   . Cancer Maternal Aunt     unknown type  . Celiac disease Maternal Aunt   . Diabetes Maternal Uncle    . Colon cancer Cousin     dx. 50s-60s  . Colon cancer Paternal Aunt     dx. late 23s  . Lung cancer Paternal Aunt     dx. late 60s-70s; former smoker  . Heart attack Paternal Aunt   . Heart attack Paternal Uncle       Review of Systems  All other systems reviewed and are negative.      Objective:   Physical Exam  Constitutional: She is oriented to person, place, and time. She appears  well-developed and well-nourished. No distress.  HENT:  Head: Normocephalic and atraumatic.  Right Ear: External ear normal.  Left Ear: External ear normal.  Nose: Nose normal.  Mouth/Throat: Oropharynx is clear and moist. No oropharyngeal exudate.  Eyes: Conjunctivae and EOM are normal. Pupils are equal, round, and reactive to light. Right eye exhibits no discharge. Left eye exhibits no discharge. No scleral icterus.  Neck: Normal range of motion. Neck supple. No JVD present. No tracheal deviation present. No thyromegaly present.  Cardiovascular: Normal rate, regular rhythm and intact distal pulses.  Exam reveals no gallop and no friction rub.   No murmur heard. Pulmonary/Chest: Effort normal and breath sounds normal. No stridor. No respiratory distress. She has no wheezes. She has no rales. She exhibits no tenderness.  Abdominal: Soft. Bowel sounds are normal. She exhibits no distension and no mass. There is no tenderness. There is no rebound and no guarding.  Musculoskeletal: Normal range of motion. She exhibits no edema, tenderness or deformity.  Lymphadenopathy:    She has no cervical adenopathy.  Neurological: She is alert and oriented to person, place, and time. She has normal reflexes. She displays normal reflexes. No cranial nerve deficit. She exhibits normal muscle tone. Coordination normal.  Skin: Skin is warm. No rash noted. She is not diaphoretic. No erythema. No pallor.  Psychiatric: She has a normal mood and affect. Her behavior is normal. Judgment and thought content normal.    Vitals reviewed.         Assessment & Plan:  General medical exam  Essential hypertension  Nonischemic cardiomyopathy (HCC)  Paroxysmal atrial fibrillation (HCC)  Mild persistent asthma, unspecified whether complicated  Type 2 diabetes mellitus without complication, without long-term current use of insulin ([place]) - Plan: CBC with Differential/Platelet, COMPLETE METABOLIC PANEL WITH GFR, Lipid panel, Hemoglobin A1c, Microalbumin, urine  Hyperlipidemia, unspecified hyperlipidemia type  I'll schedule patient for a bone density. We both agree that mammograms make little clinical sense as the patient has had all breast tissue removed. Colonoscopy is up-to-date. Due to her age she does not require colonoscopy. She will receive the tetanus shot today. Check CBC, CMP, fasting lipid panel. EKG shows stable left bundle branch block which is chronic. Patient declines Pneumovax 23. The remainder of her preventative care is up-to-date

## 2017-01-29 ENCOUNTER — Other Ambulatory Visit: Payer: Self-pay | Admitting: Family Medicine

## 2017-01-29 ENCOUNTER — Encounter: Payer: Self-pay | Admitting: Family Medicine

## 2017-01-29 DIAGNOSIS — Z780 Asymptomatic menopausal state: Secondary | ICD-10-CM

## 2017-01-29 LAB — MICROALBUMIN, URINE: Microalb, Ur: 0.2 mg/dL

## 2017-01-29 LAB — HEMOGLOBIN A1C
Hgb A1c MFr Bld: 6.5 % — ABNORMAL HIGH (ref <5.7)
Mean Plasma Glucose: 140 mg/dL

## 2017-02-03 DIAGNOSIS — M8589 Other specified disorders of bone density and structure, multiple sites: Secondary | ICD-10-CM | POA: Diagnosis not present

## 2017-02-03 DIAGNOSIS — Z780 Asymptomatic menopausal state: Secondary | ICD-10-CM | POA: Diagnosis not present

## 2017-02-03 LAB — HM DEXA SCAN: HM Dexa Scan: NORMAL

## 2017-02-06 ENCOUNTER — Other Ambulatory Visit: Payer: Self-pay | Admitting: Family Medicine

## 2017-02-06 ENCOUNTER — Other Ambulatory Visit (HOSPITAL_COMMUNITY): Payer: Self-pay | Admitting: Cardiology

## 2017-02-12 ENCOUNTER — Other Ambulatory Visit: Payer: Self-pay | Admitting: Family Medicine

## 2017-02-17 ENCOUNTER — Encounter: Payer: Self-pay | Admitting: Family Medicine

## 2017-03-05 ENCOUNTER — Telehealth: Payer: Self-pay

## 2017-03-05 DIAGNOSIS — H8113 Benign paroxysmal vertigo, bilateral: Secondary | ICD-10-CM

## 2017-03-05 MED ORDER — MECLIZINE HCL 25 MG PO TABS: 25.0000 mg | ORAL_TABLET | Freq: Three times a day (TID) | ORAL | 0 refills | Status: DC | PRN

## 2017-03-05 NOTE — Telephone Encounter (Signed)
Pt called in and states she is feeling nausea as well as light headed after bending over. Pt states her blood sugar is normal she has not had any vomiting. Pt states she has had problems with vertigo in the past. When asked if patient had taking her medication meclizine. Pt stated she was out.  I advised pt I will send in a refill for her to take and she if it helps with the vertigo and if not then to follow up with our office 02/05/2017. Also explained to patient if her symptoms get worse then to go to an urgent care over the weekend. Patient understands the instructions

## 2017-03-11 ENCOUNTER — Other Ambulatory Visit: Payer: Self-pay | Admitting: Internal Medicine

## 2017-03-25 IMAGING — CT CT ANGIO CHEST
2 of 6 series · 19 of 36 positions shown · IV contrast (Omnipaque 300)
Comparison: CT chest 06/13/2012.  Chest x-ray 01/01/2015

CLINICAL DATA: Short of breath.  Elevated D-dimer

EXAM:
CT ANGIOGRAPHY CHEST WITH CONTRAST
TECHNIQUE: Multidetector CT imaging of the chest was performed using the
standard protocol during bolus administration of intravenous
contrast. Multiplanar CT image reconstructions and MIPs were
obtained to evaluate the vascular anatomy.
CONTRAST:  75mL OMNIPAQUE IOHEXOL 350 MG/ML SOLN

[Series 5: thins 1.0mm / 0.8mm · axial · 0.72mm/px · z∈[-241,-19]mm · 18 of 248 slices shown]
[im 13/248  lung]
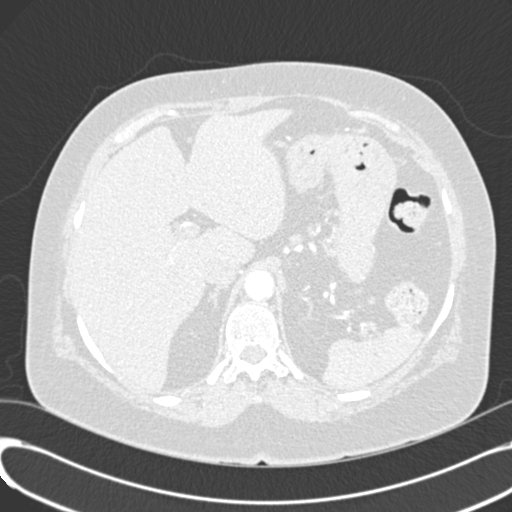
[im 25/248  mediastinal]
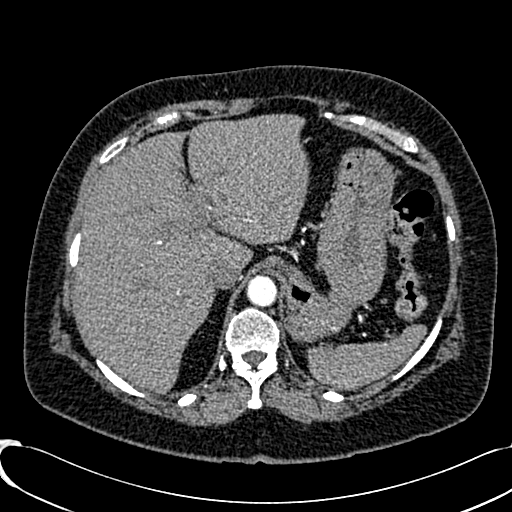
[im 38/248  lung]
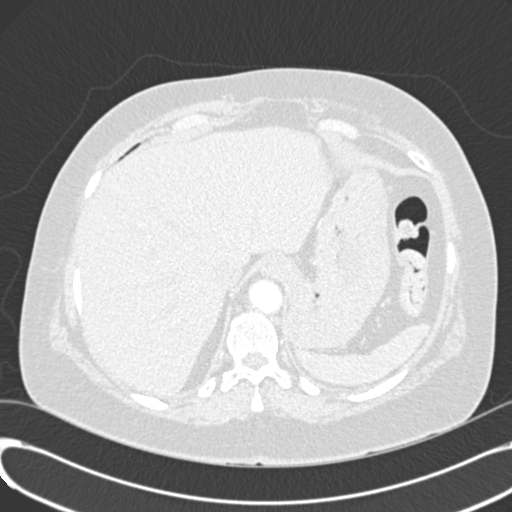
[im 50/248  mediastinal]
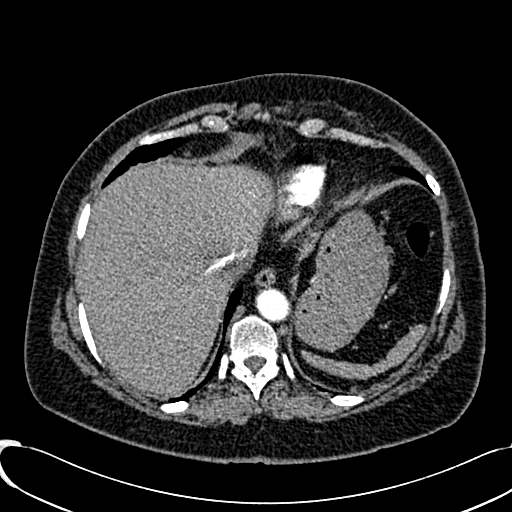
[im 62/248  lung]
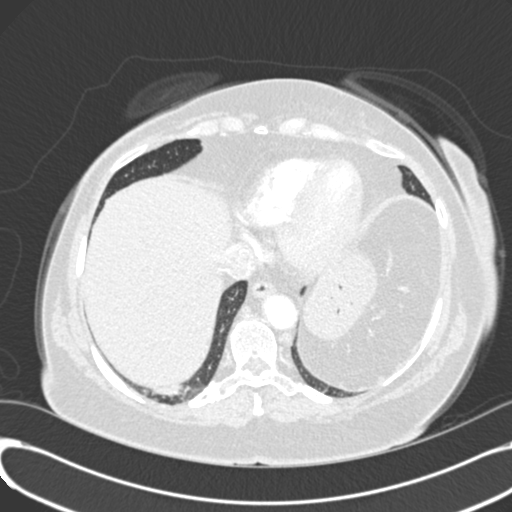
[im 75/248  mediastinal]
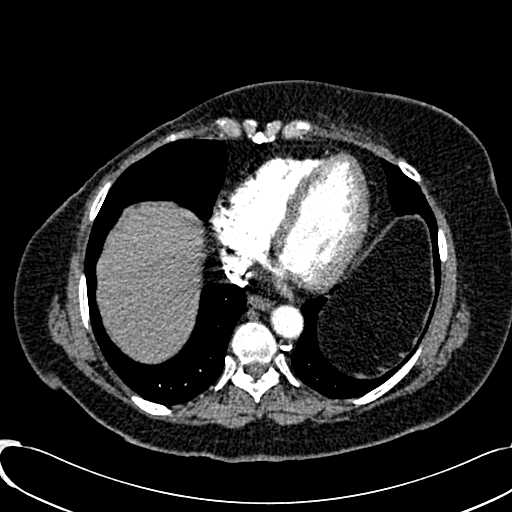
[im 87/248  lung]
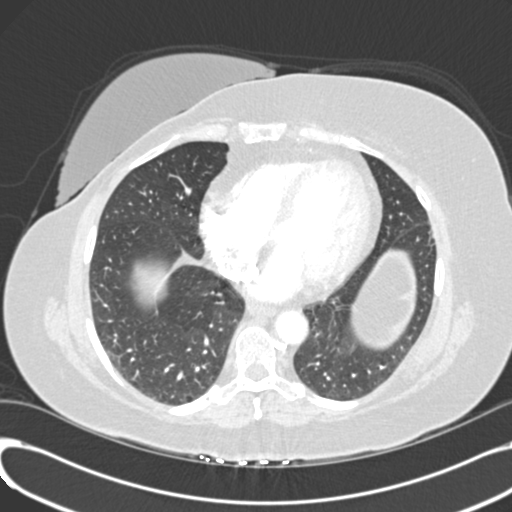
[im 99/248  mediastinal]
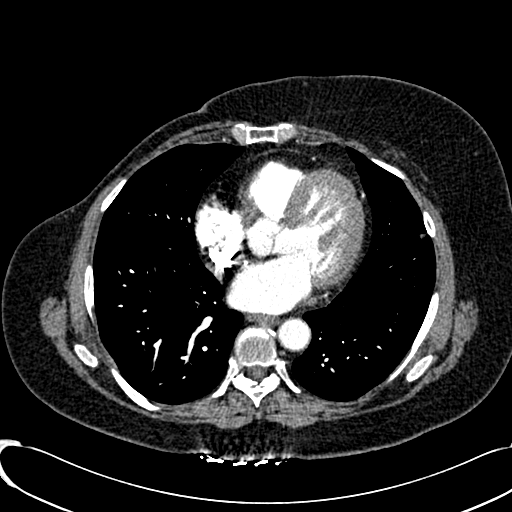
[im 112/248  lung]
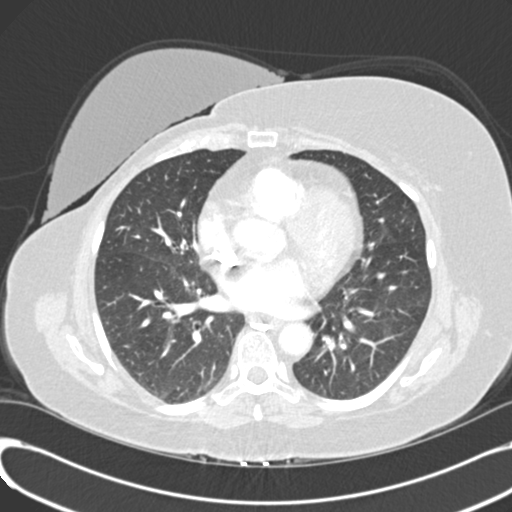
[im 136/248  mediastinal]
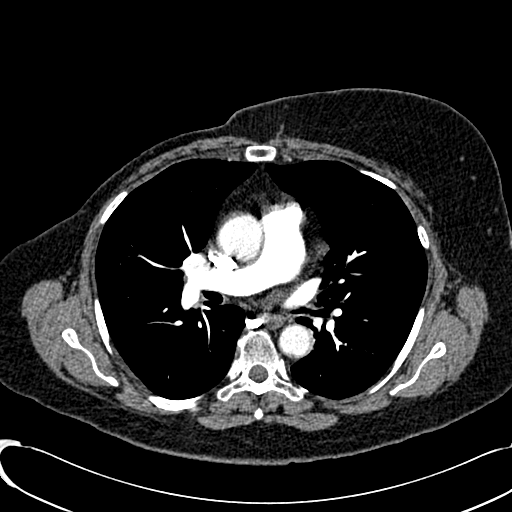
[im 149/248  lung]
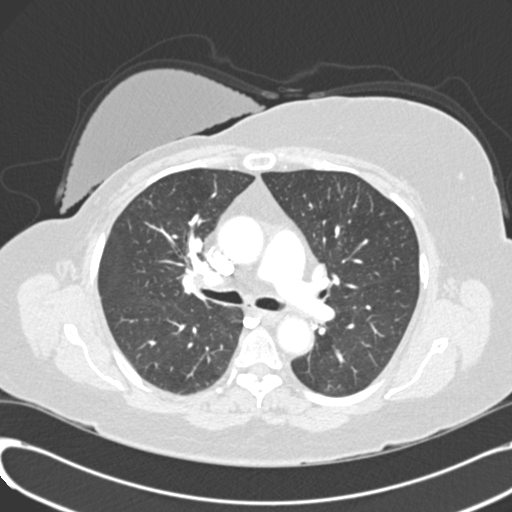
[im 161/248  mediastinal]
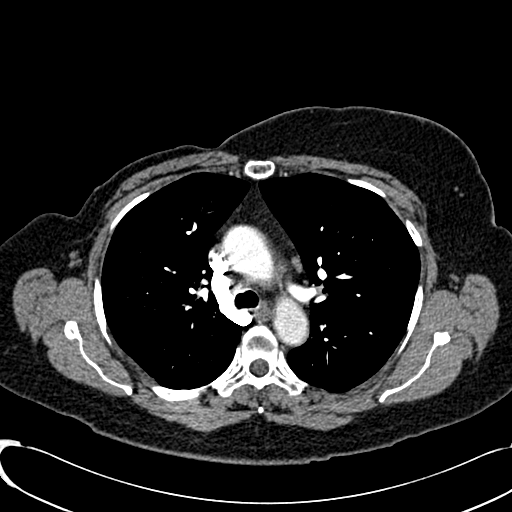
[im 173/248  lung]
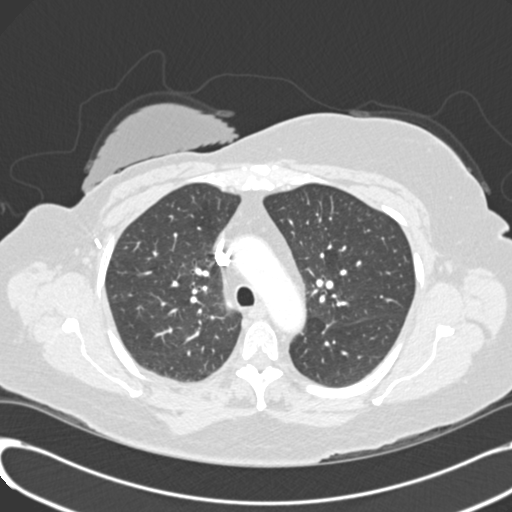
[im 186/248  mediastinal]
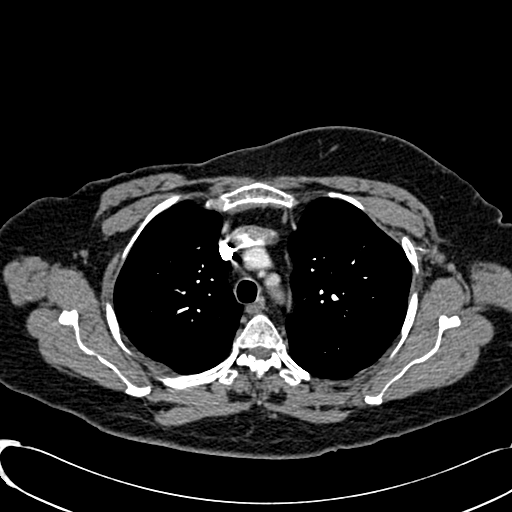
[im 198/248  lung]
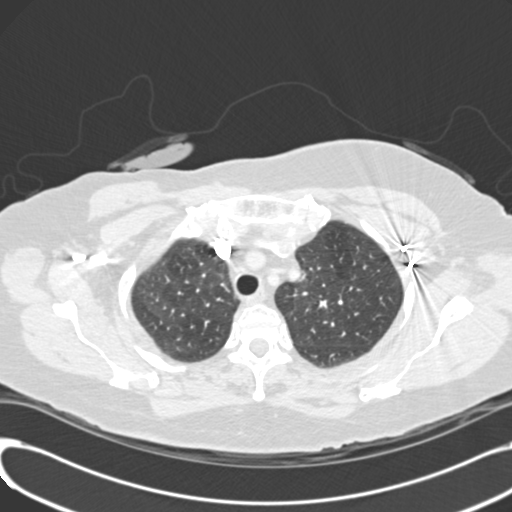
[im 210/248  mediastinal]
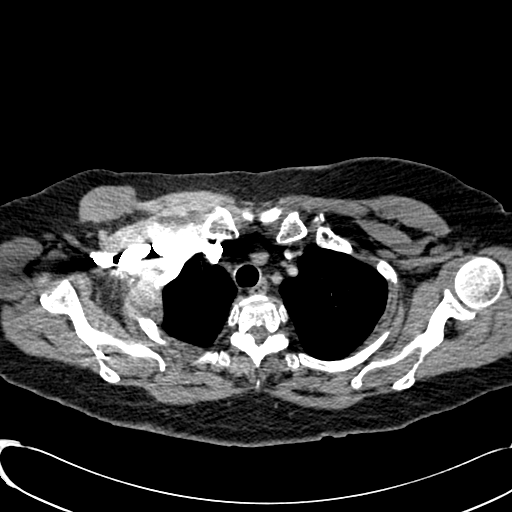
[im 223/248  lung]
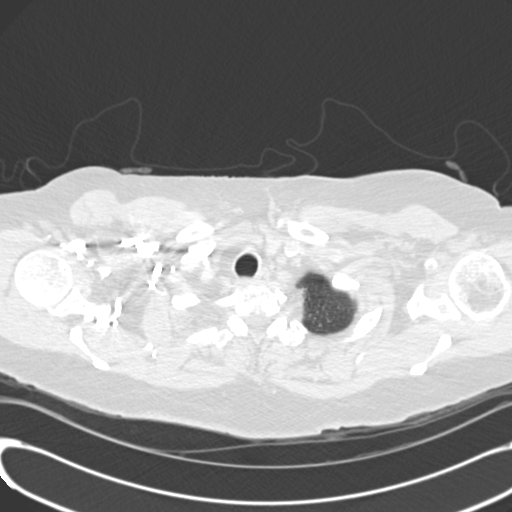
[im 235/248  mediastinal]
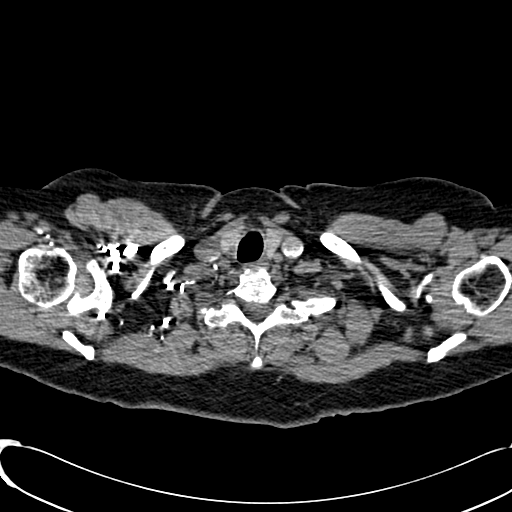

[Series 602: cor mpr · coronal · 0.72mm/px · 1 of 107 slices shown]
[im 54/107  mediastinal]
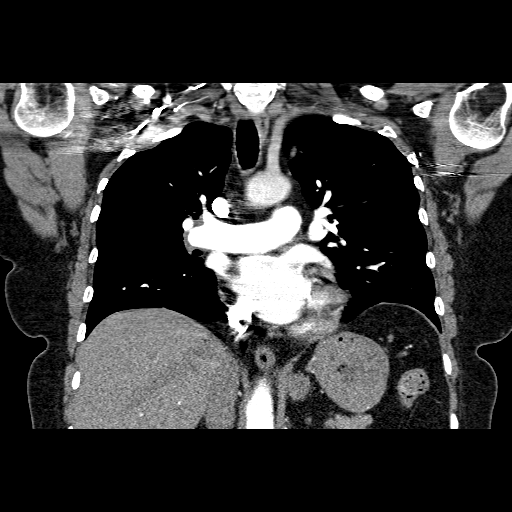

[19 of 36 positions shown; findings below may reference images not displayed]

FINDINGS: Satisfactory pulmonary artery enhancement. Negative for pulmonary
embolism. Pulmonary arteries normal in size. Negative for aortic
aneurysm or dissection. Left vertebral artery origin from the aortic
arch which is a normal variant. Heart size is normal. No pericardial
effusion. No significant coronary calcification.

Lungs are clear without infiltrate or effusion.

Negative for mass or adenopathy.

Prior mastectomy.

Negative upper abdomen.

Review of the MIP images confirms the above findings.
IMPRESSION: Negative for pulmonary embolism.  No acute abnormality.

## 2017-05-25 ENCOUNTER — Other Ambulatory Visit (HOSPITAL_COMMUNITY): Payer: Self-pay | Admitting: *Deleted

## 2017-05-25 ENCOUNTER — Encounter (HOSPITAL_COMMUNITY): Payer: Self-pay | Admitting: *Deleted

## 2017-05-25 MED ORDER — LOSARTAN POTASSIUM 100 MG PO TABS: 100.0000 mg | ORAL_TABLET | Freq: Every day | ORAL | 3 refills | Status: DC

## 2017-05-25 NOTE — Telephone Encounter (Signed)
Received fax from [facility] that patient's valsartan has been recalled.  Discussed with Doroteo Bradford pharmD for dosing of losartan.  Patient will stop valsartan 240 mg  Daily and start Losartan 100 mg Daily.    Patient is aware of change and no further questions.

## 2017-05-26 ENCOUNTER — Encounter: Payer: Self-pay | Admitting: Family Medicine

## 2017-05-26 ENCOUNTER — Ambulatory Visit (INDEPENDENT_AMBULATORY_CARE_PROVIDER_SITE_OTHER): Payer: Medicare Other | Admitting: Family Medicine

## 2017-05-26 VITALS — BP 98/56 | HR 64 | Temp 98.1°F | Resp 18 | Ht 62.0 in | Wt 180.0 lb

## 2017-05-26 DIAGNOSIS — G8929 Other chronic pain: Secondary | ICD-10-CM

## 2017-05-26 DIAGNOSIS — M25562 Pain in left knee: Secondary | ICD-10-CM

## 2017-05-26 NOTE — Progress Notes (Signed)
Subjective:    Patient ID: [name], female    DOB: 01/06/1946, 71 y.o.   MRN: [id number]  Knee Pain    07/15/15 Patient presents with left knee pain. She has osteoarthritis in the left knee and underwent arthroscopic surgery  For the same diagnosis in 2006. She received steroid injections in her left knee periodically over the last 10 years due to pain. The pain is completely the same as it has been over the last 10 years. It is worse going up or coming down steps. It is worse with walking. She also has a mild knee effusion. There is no erythema or warmth around the knee. She denies any falls or injuries recently. She denies any night sweats or bone pain anywhere else in the body.  At that time,my plan was: Patient has osteoarthritis in the left knee. Using sterile technique, I injected the left knee with a mixture of 2 mL of lidocaine, 2 mL of Marcaine, and 2 mL of 40 mg per mL Kenalog. The patient tolerated the procedure well with no complications. Recheck in one week if no better or sooner if worse.  01/21/16 Patient is requesting a repeat injection in her left knee. The previous steroid injection lasted approximately 6 months. The pain just recently returned. She continues to have pain going down steps which is worsened with walking. She also has an effusion in her left knee because of stiffness and pain.  At that time, my plan was: Patient has osteoarthritis in the left knee. Using sterile technique, I injected the left knee with a mixture of 2 mL of lidocaine, 2 mL of Marcaine, and 2 mL of 40 mg per mL Kenalog. The patient tolerated the procedure well with no complications. Recheck in one week if no better or sooner if worse.  07/20/16 The patient's left knee is hurting her again. She has been painting her house. She's been climbing up and down ladders. She is now having pain worse over the medial compartment of the left knee but also the lateral compartment. She is requesting a repeat  cortisone injection.  01/07/17 It is been approximately 6 months since her last cortisone injection. She states the pain in her left knee has returned. She reports pain with range of motion, pain with standing for prolonged periods of time, pain with walking. As long as she is lying still in bed or sitting still, she has no pain. She reports tenderness over the medial and lateral joint line. She has a small effusion. She denies any erythema or warmth. She denies any falls or recent injury. She is requesting a repeat cortisone injection.  At that time, my plan was:  Patient has osteoarthritis in the left knee. Using sterile technique, I injected the left knee with a mixture of 2 mL of lidocaine, 2 mL of Marcaine, and 2 mL of 40 mg per mL Kenalog. The patient tolerated the procedure well with no complications. Recheck in one week if no better or sooner if worse.  05/26/17 Pain has returned in her left knee. She states that 75% of the time her knee feels. Good. However the 25% of the time she is left with severe pain and debilitating pain primarily in the back of the knee but also over the medial and lateral joint lines anteriorly. It hurts to bend. It hurts to go up steps. It hurts to walk. It hurts to stand for prolonged periods of time. She is unable to take NSAIDs  because of her chronic anticoagulation for atrial fibrillation. Tylenol is ineffective. She is tried [place] injections in the past with no benefit. She has never tried platelet rich plasma injections. However the cortisone injections until now have given her reasonable relief for up to 6 months at the time. Unfortunately this time the pain is returning more quickly than before. Past Medical History:  Diagnosis Date  . Allergy history unknown   . Arthritis   . Asthma   . Breast cancer ([place]) 1999; 2008   hx of bilateral breast cancer  . Breast cancer, left breast ([place]) 10/27/2012   3.2 cm ER positive, Her-2 negative, 1 node positive S/P  mastectomy/TTRAM reconstruction 4/08  . Breast cancer, right breast ([place]) 10/27/2012   3.2 cm ER/PR positive, 1 node positive, Her-2 negative Rx w mastectomy, AC -T chemo, followed by aromatase inhibitor x 5 years dx 4/08  . Cancer [facility])    breast CA  . Diabetes ([place])   . DM2 (diabetes mellitus, type 2) ([place])   . Elevated cholesterol   . GERD (gastroesophageal reflux disease)   . Hiatal hernia   . Hypertension   . LBBB (left bundle branch block) 10/27/2012  . Leg cramps   . NICM (nonischemic cardiomyopathy) ([place])    a. Echo:  06/14/12: Poor endocardial definition, possible septal HK, EF 50-55%, normal wall motion, mild LAE ;  b.  [facility] 9/13:  normal cors, EF 35-40%,  c. Echo 7/14: EF 50-55%, mild LAE  . Other fatigue 01/01/2015   Past Surgical History:  Procedure Laterality Date  . DILATION AND CURETTAGE OF UTERUS    . KNEE SURGERY     left  . LEFT HEART CATHETERIZATION WITH CORONARY ANGIOGRAM N/A 06/15/2012   Procedure: LEFT HEART CATHETERIZATION WITH CORONARY ANGIOGRAM;  Surgeon: Wellington Hampshire, MD;  Location: [facility];  Service: Cardiovascular;  Laterality: N/A;  . MASTECTOMY     bilateral   Current Outpatient Prescriptions on File Prior to Visit  Medication Sig Dispense Refill  . atorvastatin (LIPITOR) 20 MG tablet TAKE 2 TABLETS (40 MG TOTAL) BY MOUTH EVERY EVENING. 180 tablet 2  . Blood Glucose Monitoring Suppl (ONE TOUCH ULTRA 2) W/DEVICE KIT Check FBS qam - Dx:250.00 and she will need lancets (1 box) , strips (1 bottle = 100) and controls also thanks 1 each 0  . Calcium Carbonate-Vitamin D (CALCIUM-VITAMIN D) 500-200 MG-UNIT per tablet Take 1 tablet by mouth 2 (two) times daily with a meal.     . carvedilol (COREG) 12.5 MG tablet Take 1.5 tablets (18.75 mg total) by mouth 2 (two) times daily with a meal. 270 tablet 3  . carvedilol (COREG) 12.5 MG tablet Take 1-1.5 tablets (12.5-18.75 mg total) by mouth as directed. One tab in the AM and one and one half tab in the PM 210 tablet  3  . cetirizine (ZYRTEC) 10 MG tablet Take 10 mg by mouth every evening.    Marland Kitchen DEXILANT 60 MG capsule TAKE ONE CAPSULE BY MOUTH EVERY DAY 90 capsule 3  . ELIQUIS 5 MG TABS tablet TAKE 1 TABLET (5 MG TOTAL) BY MOUTH 2 (TWO) TIMES DAILY. 180 tablet 3  . fluticasone (FLONASE) 50 MCG/ACT nasal spray USE 2 SPRAYS IN EACH NOSTRIL DAILY FOR 3 MONTHS 48 g 0  . furosemide (LASIX) 20 MG tablet TAKE 1 TABLET (20 MG TOTAL) BY MOUTH DAILY. 90 tablet 3  . glucose blood test strip accu check comfort curve Checks FBS qam 100 each 5  .  losartan (COZAAR) 100 MG tablet Take 1 tablet (100 mg total) by mouth daily. 90 tablet 3  . meclizine (ANTIVERT) 25 MG tablet Take 1 tablet (25 mg total) by mouth 3 (three) times daily as needed for dizziness. 30 tablet 0  . spironolactone (ALDACTONE) 25 MG tablet TAKE 1 TABLET (25 MG TOTAL) BY MOUTH DAILY. 90 tablet 3  . vitamin C (ASCORBIC ACID) 500 MG tablet Take 500 mg by mouth 2 (two) times daily.      . WELCHOL 625 MG tablet TAKE 3 TABLETS (1,875 MG TOTAL) BY MOUTH 2 (TWO) TIMES DAILY WITH A MEAL. 540 tablet 3  . zafirlukast (ACCOLATE) 20 MG tablet TAKE 1 TABLET (20 MG TOTAL) BY MOUTH 2 (TWO) TIMES DAILY. 180 tablet 4   No current facility-administered medications on file prior to visit.    Allergies  Allergen Reactions  . Adhesive [Tape] Other (See Comments)    Skin turns red   . Diphenhydramine Other (See Comments)    Pt feels wired   . Vicodin [Hydrocodone-Acetaminophen] Nausea And Vomiting   Social History   Social History  . Marital status: Married    Spouse name: N/A  . Number of children: 2  . Years of education: N/A   Occupational History  . retired Retired    post Furniture conservator/restorer   Social History Main Topics  . Smoking status: Never Smoker  . Smokeless tobacco: Never Used  . Alcohol use No  . Drug use: No  . Sexual activity: No   Other Topics Concern  . Not on file   Social History Narrative  . No narrative on file      Review of  Systems  All other systems reviewed and are negative.      Objective:   Physical Exam  Constitutional: She appears well-developed and well-nourished.  Cardiovascular: Normal rate.   Pulmonary/Chest: Effort normal and breath sounds normal. No respiratory distress. She has no wheezes.  Musculoskeletal:       Left knee: She exhibits decreased range of motion, swelling and effusion. She exhibits no LCL laxity, normal meniscus and no MCL laxity. Tenderness found. Medial joint line and lateral joint line tenderness noted. No MCL and no LCL tenderness noted.  Vitals reviewed.         Assessment & Plan:   Chronic pain of left knee  I believe the patient's osteoarthritis is gradually worsening. The cortisone injections are becoming more frequent and providing less relief. Patient would like another injection today. Therefore using sterile technique, I injected the left knee with a mixture of 2 mL of lidocaine, 2 mL of Marcaine, and 2 mL of 40 mg per mL Kenalog. She tolerated the procedure well without complication. However I also recommended that we start getting her established with an orthopedist as I believe that she may need to consider a knee replacement in the next 12-18 months. Patient would like to see how she does with this cortisone injection first. Return for fasting lab work in October

## 2017-06-10 ENCOUNTER — Encounter (HOSPITAL_COMMUNITY): Payer: Self-pay | Admitting: Cardiology

## 2017-06-10 ENCOUNTER — Ambulatory Visit (HOSPITAL_COMMUNITY)
Admission: RE | Admit: 2017-06-10 | Discharge: 2017-06-10 | Disposition: A | Discharge status: Home / Self Care | Payer: Medicare Other | Source: Ambulatory Visit | Attending: Cardiology | Admitting: Cardiology

## 2017-06-10 VITALS — BP 115/65 | HR 72 | Wt 179.0 lb

## 2017-06-10 DIAGNOSIS — C50912 Malignant neoplasm of unspecified site of left female breast: Secondary | ICD-10-CM

## 2017-06-10 DIAGNOSIS — Z9013 Acquired absence of bilateral breasts and nipples: Secondary | ICD-10-CM | POA: Insufficient documentation

## 2017-06-10 DIAGNOSIS — Z853 Personal history of malignant neoplasm of breast: Secondary | ICD-10-CM | POA: Diagnosis not present

## 2017-06-10 DIAGNOSIS — K219 Gastro-esophageal reflux disease without esophagitis: Secondary | ICD-10-CM | POA: Insufficient documentation

## 2017-06-10 DIAGNOSIS — Z7901 Long term (current) use of anticoagulants: Secondary | ICD-10-CM | POA: Insufficient documentation

## 2017-06-10 DIAGNOSIS — I447 Left bundle-branch block, unspecified: Secondary | ICD-10-CM | POA: Insufficient documentation

## 2017-06-10 DIAGNOSIS — I480 Paroxysmal atrial fibrillation: Secondary | ICD-10-CM | POA: Diagnosis not present

## 2017-06-10 DIAGNOSIS — E119 Type 2 diabetes mellitus without complications: Secondary | ICD-10-CM | POA: Insufficient documentation

## 2017-06-10 DIAGNOSIS — E785 Hyperlipidemia, unspecified: Secondary | ICD-10-CM | POA: Insufficient documentation

## 2017-06-10 DIAGNOSIS — I10 Essential (primary) hypertension: Secondary | ICD-10-CM | POA: Insufficient documentation

## 2017-06-10 DIAGNOSIS — I428 Other cardiomyopathies: Secondary | ICD-10-CM | POA: Diagnosis not present

## 2017-06-10 DIAGNOSIS — I429 Cardiomyopathy, unspecified: Secondary | ICD-10-CM | POA: Insufficient documentation

## 2017-06-10 DIAGNOSIS — Z86711 Personal history of pulmonary embolism: Secondary | ICD-10-CM | POA: Insufficient documentation

## 2017-06-10 DIAGNOSIS — J45909 Unspecified asthma, uncomplicated: Secondary | ICD-10-CM | POA: Diagnosis not present

## 2017-06-10 LAB — CBC
HEMATOCRIT: 35.3 % — AB (ref 36.0–46.0)
HEMOGLOBIN: 11.5 g/dL — AB (ref 12.0–15.0)
MCH: 30.9 pg (ref 26.0–34.0)
MCHC: 32.6 g/dL (ref 30.0–36.0)
MCV: 94.9 fL (ref 78.0–100.0)
Platelets: 252 10*3/uL (ref 150–400)
RBC: 3.72 MIL/uL — AB (ref 3.87–5.11)
RDW: 12.4 % (ref 11.5–15.5)
WBC: 7.7 10*3/uL (ref 4.0–10.5)

## 2017-06-10 LAB — BASIC METABOLIC PANEL
ANION GAP: 7 (ref 5–15)
BUN: 18 mg/dL (ref 6–20)
CHLORIDE: 107 mmol/L (ref 101–111)
CO2: 25 mmol/L (ref 22–32)
Calcium: 9.4 mg/dL (ref 8.9–10.3)
Creatinine, Ser: 1.03 mg/dL — ABNORMAL HIGH (ref 0.44–1.00)
GFR calc Af Amer: >60 mL/min (ref >60)
GFR calc non Af Amer: 53 mL/min — ABNORMAL LOW (ref >60)
Glucose, Bld: 122 mg/dL — ABNORMAL HIGH (ref 65–99)
POTASSIUM: 4.3 mmol/L (ref 3.5–5.1)
SODIUM: 139 mmol/L (ref 135–145)

## 2017-06-10 LAB — MAGNESIUM: Magnesium: 1.8 mg/dL (ref 1.7–2.4)

## 2017-06-10 NOTE — Progress Notes (Signed)
Patient ID: [name], female   DOB: 02/03/1946, 71 y.o.   MRN: [id number]    Advanced Heart Failure Clinic Note   PCP: Dr. Dennard Schaumann HF: Dr. Aundra Dubin   71 yo with history of HTN and type II diabetes presents for cardiology followup.  Back in 2009, she had an echo with normal EF.  She was admitted in 9/13 with chest pain and LBBB.  She had LHC showing no CAD but EF 35-40%.  Echo was a technically difficult study with EF reported as 50-55%.  She has been taking ARB and Coreg.  Repeat echo in 12/13 showed EF 40% with septal hypokinesis.  Cardiac MRI was done in 12/13 as well, with calculated EF 50%, global hypokinesis, and no delayed enhancement.  Echo in 7/15 showed EF 55-60% with mild MR and mild TR (no LBBB at this time).   In 3/16, she began to develop increased dyspnea.  She was seen by Truitt Merle and was noted to have a LBBB again. Echo in 3/16 showed EF down to 35-40%.  She was then seen in followup by Rosaria Ferries.  Alivecor on her phone showed a period of HR up to 160s that appeared to be atrial fibrillation.  She felt tachypalpitations. Coreg was increased and she was started on apixaban 5 mg bid. CPX in 4/16 showed a submaximal effort but normal spirometry and normal peak VO2.  She did have ST changes possibly concerning for ischemia (but has baseline LBBB so hard to interpret).    Cardiolite in 5/16 showed on ischemia or infarction.  Most recent echo in 2/17 showed EF 45-50%, diffuse hypokinesis, grade I diastolic dysfunction, normal RV size and systolic function.  Echo 1/18 with EF stable at 45%.   Patient is stable clinically.  No chest pain.  No palpitations or lightheadedness.  She is not short of breath except with heavy activity.  She has not been very active due to OA in knees.  She avoids stairs.  No BRBPR/melena.  No orthopnea/PND.   ECG (personally reviewed): NSR, LBBB  Labs (9/13): TSH 4.884 (elevated), proBNP 155 Labs (10/13): K 3.6, creatinine 0.8 Labs (1/14): K 3.7,  creatinine 0.8, BNP not elevated, ANA weakly positive, TSH normal Labs (7/14): BNP normal Labs (11/14): K 4.1, creatinine 0.73 Labs (10/15): K 4.2, creatinine 0.73, LDL 45 Labs (3/16): BNP 46, HCT 42.2, TSH normal Labs (4/16): K 4.1, creatinine 0.78 => 0.8, HCT 39.3 Labs (1/17): K 4.6, creatinine 1.06, hgb 13.1, BNP 22 Labs (10/17): LDL 49, HDL 37, K 4.8, creatinine 0.92, TSH normal, BNP 62 Labs (4/18): LDL 48, K 4.4, creatinine 0.91  PMH: 1. LBBB/IVCD: Now persistent.   2. Breast cancer s/p bilateral mastectomy. 3. GERD 4. Type II diabetes mellitus 5. HTN 6. Asthma 7. PE in 2001 8. Hyperlipidemia 9. Nonischemic Cardiomyopathy: Possible LBBB cardiomyopathy. Echo 8/09 with 60%.  Admitted in 9/13 with chest pain.  LHC showed no angiographic CAD and EF 35-40%.  Echo at that time was read as showing EF 50-55% but was a technically difficult study.  Echo (12/13): EF 40% with septal hypokinesis, normal RV size and systolic function, no significant valvular abnormalities.  Cardiac MRI (12/13): EF 50%, global hypokinesis, no delayed enhancement.  Echo (7/14) with EF 50-55%, mild LV dilation, mild LAE.  Echo (7/15) with EF 55-60%, mild MR and mild TR.  Echo (3/16) with EF 35-40%, septal-lateral dyssynchrony.  CPX (4/16) with RER 0.98 (submaximal), peak VO2 19.5, VE/VCO2 slope 29.4 => normal spirometry, normal  peak VO2, but ST changes suggest possibility of ischemia.  Lexiscan Cardiolite (5/16) with no ischemia/infarction. - Echo (2/17) with EF 45-50%, diffuse hypokinesis, grade I diastolic dysfunction, normal RV size and systolic function.  - Echo (1/18) with EF 45%, septal-lateral dyssynchrony, septal hypokinesis, normal RV size and systolic function.  10. Supraclavicular lymphadenopathy: Stable by CT back to 2009, suspect benign.  11. Atrial fibrillation: Paroxysmal.  Holter (4/16) with rare PACs and PVCs, short run 5 beats SVT.   SH: Married, lives in [facility], never smoked.  FH:  Uncle with ? SCD.  Brother with ? SCD.    ROS: All systems reviewed and negative except as per HPI.   Current Outpatient Prescriptions  Medication Sig Dispense Refill  . atorvastatin (LIPITOR) 20 MG tablet TAKE 2 TABLETS (40 MG TOTAL) BY MOUTH EVERY EVENING. 180 tablet 2  . Blood Glucose Monitoring Suppl (ONE TOUCH ULTRA 2) W/DEVICE KIT Check FBS qam - Dx:250.00 and she will need lancets (1 box) , strips (1 bottle = 100) and controls also thanks 1 each 0  . Calcium Carbonate-Vitamin D (CALCIUM-VITAMIN D) 500-200 MG-UNIT per tablet Take 1 tablet by mouth 2 (two) times daily with a meal.     . carvedilol (COREG) 12.5 MG tablet Take 1-1.5 tablets (12.5-18.75 mg total) by mouth as directed. One tab in the AM and one and one half tab in the PM 210 tablet 3  . cetirizine (ZYRTEC) 10 MG tablet Take 10 mg by mouth every evening.    Marland Kitchen DEXILANT 60 MG capsule TAKE ONE CAPSULE BY MOUTH EVERY DAY 90 capsule 3  . ELIQUIS 5 MG TABS tablet TAKE 1 TABLET (5 MG TOTAL) BY MOUTH 2 (TWO) TIMES DAILY. 180 tablet 3  . fluticasone (FLONASE) 50 MCG/ACT nasal spray USE 2 SPRAYS IN EACH NOSTRIL DAILY FOR 3 MONTHS 48 g 0  . furosemide (LASIX) 20 MG tablet TAKE 1 TABLET (20 MG TOTAL) BY MOUTH DAILY. 90 tablet 3  . glucose blood test strip accu check comfort curve Checks FBS qam 100 each 5  . losartan (COZAAR) 100 MG tablet Take 1 tablet (100 mg total) by mouth daily. 90 tablet 3  . meclizine (ANTIVERT) 25 MG tablet Take 1 tablet (25 mg total) by mouth 3 (three) times daily as needed for dizziness. 30 tablet 0  . spironolactone (ALDACTONE) 25 MG tablet TAKE 1 TABLET (25 MG TOTAL) BY MOUTH DAILY. 90 tablet 3  . vitamin C (ASCORBIC ACID) 500 MG tablet Take 500 mg by mouth 2 (two) times daily.      . WELCHOL 625 MG tablet TAKE 3 TABLETS (1,875 MG TOTAL) BY MOUTH 2 (TWO) TIMES DAILY WITH A MEAL. 540 tablet 3  . zafirlukast (ACCOLATE) 20 MG tablet TAKE 1 TABLET (20 MG TOTAL) BY MOUTH 2 (TWO) TIMES DAILY. 180 tablet 4   No  current facility-administered medications for this encounter.     BP 115/65   Pulse 72   Wt 179 lb (81.2 kg)   SpO2 100%   BMI 32.74 kg/m    Wt Readings from Last 3 Encounters:  06/10/17 179 lb (81.2 kg)  05/26/17 180 lb (81.6 kg)  01/28/17 178 lb (80.7 kg)    General: NAD Neck: No JVD, no thyromegaly or thyroid nodule.  Lungs: Clear to auscultation bilaterally with normal respiratory effort. CV: Nondisplaced PMI.  Heart regular S1/S2 with paradoxically split S2, no S3/S4, no murmur.  No peripheral edema.  No carotid bruit.  Normal pedal pulses.  Abdomen: Soft, nontender, no hepatosplenomegaly, no distention.  Skin: Intact without lesions or rashes.  Neurologic: Alert and oriented x 3.  Psych: Normal affect. Extremities: No clubbing or cyanosis.  HEENT: Normal.    Assessment/Plan: 1. Nonischemic cardiomyopathy:   No CAD on cath 9/13 but EF 35-40% at that time.  ECG showed LBBB.  LBBB resolved and EF normalized.  However, in 3/16, patient had fatigue and dyspnea => repeat echo with EF back down to 35-40%, she now has a persistent LBBB. This may be a LBBB cardiomyopathy.  CPX in 4/16 was submaximal but showed normal spirometry an actually near normal peak VO2.  Last echo in 1/18 showed EF stable at 45% with septal-lateral dyssynchrony.  NYHA class II symptoms. Volume status stable on exam. - Continue lasix 20 mg daily.  BMET/Mg today. - Continue Coreg 12.5 qam/18.75 qpm (she cannot tolerate 18.75 bid due to dizziness/fatigue).   - Continue losartan 100 mg daily.  - Continue spironolactone 25 mg daily - As long as EF remains stable and symptoms are stable, I will continue the current regimen. Repeat echo after next appt in 6 months.  2. Hyperlipidemia:  Continue atorvastatin 40 mg qhs.  Good lipids 4/18.   3. Atrial fibrillation: Paroxysmal.  NSR today.  - Continue Eliquis 5 mg bid.  CBC today.  4. LBBB: Persistent and may be contributing to cardiomyopathy.   Followup in 6  months.   Loralie Champagne 06/10/2017

## 2017-06-10 NOTE — Patient Instructions (Signed)
Labs drawn today  Your physician recommends that you schedule a follow-up appointment in: 6 months

## 2017-06-16 DIAGNOSIS — Z23 Encounter for immunization: Secondary | ICD-10-CM | POA: Diagnosis not present

## 2017-08-12 ENCOUNTER — Encounter: Payer: Self-pay | Admitting: Family Medicine

## 2017-08-12 ENCOUNTER — Ambulatory Visit (INDEPENDENT_AMBULATORY_CARE_PROVIDER_SITE_OTHER): Payer: Medicare Other | Admitting: Family Medicine

## 2017-08-12 VITALS — BP 136/78 | HR 80 | Temp 98.1°F | Resp 18 | Ht 62.0 in | Wt 181.0 lb

## 2017-08-12 DIAGNOSIS — E119 Type 2 diabetes mellitus without complications: Secondary | ICD-10-CM

## 2017-08-12 DIAGNOSIS — I10 Essential (primary) hypertension: Secondary | ICD-10-CM

## 2017-08-12 DIAGNOSIS — Z23 Encounter for immunization: Secondary | ICD-10-CM

## 2017-08-12 NOTE — Progress Notes (Signed)
Subjective:    Patient ID: [name], female    DOB: October 08, 1945, 71 y.o.   MRN: [id number]  HPI Patient has type 2 diabetes mellitus.  Is here today for recheck of her hemoglobin A1c.  Diabetic eye exam is up-to-date.  She denies any chest pain shortness of breath or dyspnea on exertion.  She denies any polyuria, polydipsia, or blurry vision.  She denies any neuropathy in her feet Past Medical History:  Diagnosis Date  . Allergy history unknown   . Arthritis   . Asthma   . Breast cancer ([place]) 1999; 2008   hx of bilateral breast cancer  . Breast cancer, left breast ([place]) 10/27/2012   3.2 cm ER positive, Her-2 negative, 1 node positive S/P mastectomy/TTRAM reconstruction 4/08  . Breast cancer, right breast ([place]) 10/27/2012   3.2 cm ER/PR positive, 1 node positive, Her-2 negative Rx w mastectomy, AC -T chemo, followed by aromatase inhibitor x 5 years dx 4/08  . Cancer [facility])    breast CA  . Diabetes ([place])   . DM2 (diabetes mellitus, type 2) ([place])   . Elevated cholesterol   . GERD (gastroesophageal reflux disease)   . Hiatal hernia   . Hypertension   . LBBB (left bundle branch block) 10/27/2012  . Leg cramps   . NICM (nonischemic cardiomyopathy) ([place])    a. Echo:  06/14/12: Poor endocardial definition, possible septal HK, EF 50-55%, normal wall motion, mild LAE ;  b.  [facility] [facility] 9/13:  normal cors, EF 35-40%,  c. Echo 7/14: EF 50-55%, mild LAE  . Other fatigue 01/01/2015   Past Surgical History:  Procedure Laterality Date  . DILATION AND CURETTAGE OF UTERUS    . KNEE SURGERY     left  . MASTECTOMY     bilateral   Current Outpatient Medications on File Prior to Visit  Medication Sig Dispense Refill  . atorvastatin (LIPITOR) 20 MG tablet TAKE 2 TABLETS (40 MG TOTAL) BY MOUTH EVERY EVENING. 180 tablet 2  . Blood Glucose Monitoring Suppl (ONE TOUCH ULTRA 2) W/DEVICE KIT Check FBS qam - Dx:250.00 and she will need lancets (1 box) , strips (1 bottle = 100) and controls also thanks 1 each 0    . Calcium Carbonate-Vitamin D (CALCIUM-VITAMIN D) 500-200 MG-UNIT per tablet Take 1 tablet by mouth 2 (two) times daily with a meal.     . carvedilol (COREG) 12.5 MG tablet Take 1-1.5 tablets (12.5-18.75 mg total) by mouth as directed. One tab in the AM and one and one half tab in the PM 210 tablet 3  . cetirizine (ZYRTEC) 10 MG tablet Take 10 mg by mouth every evening.    Marland Kitchen DEXILANT 60 MG capsule TAKE ONE CAPSULE BY MOUTH EVERY DAY 90 capsule 3  . ELIQUIS 5 MG TABS tablet TAKE 1 TABLET (5 MG TOTAL) BY MOUTH 2 (TWO) TIMES DAILY. 180 tablet 3  . fluticasone (FLONASE) 50 MCG/ACT nasal spray USE 2 SPRAYS IN EACH NOSTRIL DAILY FOR 3 MONTHS 48 g 0  . furosemide (LASIX) 20 MG tablet TAKE 1 TABLET (20 MG TOTAL) BY MOUTH DAILY. 90 tablet 3  . glucose blood test strip accu check comfort curve Checks FBS qam 100 each 5  . losartan (COZAAR) 100 MG tablet Take 1 tablet (100 mg total) by mouth daily. 90 tablet 3  . meclizine (ANTIVERT) 25 MG tablet Take 1 tablet (25 mg total) by mouth 3 (three) times daily as needed for dizziness. 30 tablet 0  .  spironolactone (ALDACTONE) 25 MG tablet TAKE 1 TABLET (25 MG TOTAL) BY MOUTH DAILY. 90 tablet 3  . vitamin C (ASCORBIC ACID) 500 MG tablet Take 500 mg by mouth 2 (two) times daily.      . WELCHOL 625 MG tablet TAKE 3 TABLETS (1,875 MG TOTAL) BY MOUTH 2 (TWO) TIMES DAILY WITH A MEAL. 540 tablet 3  . zafirlukast (ACCOLATE) 20 MG tablet TAKE 1 TABLET (20 MG TOTAL) BY MOUTH 2 (TWO) TIMES DAILY. 180 tablet 4   No current facility-administered medications on file prior to visit.    Allergies  Allergen Reactions  . Adhesive [Tape] Other (See Comments)    Skin turns red   . Diphenhydramine Other (See Comments)    Pt feels wired   . Vicodin [Hydrocodone-Acetaminophen] Nausea And Vomiting   Social History   Socioeconomic History  . Marital status: Married    Spouse name: Not on file  . Number of children: 2  . Years of education: Not on file  . Highest education  level: Not on file  Social Needs  . Financial resource strain: Not on file  . Food insecurity - worry: Not on file  . Food insecurity - inability: Not on file  . Transportation needs - medical: Not on file  . Transportation needs - non-medical: Not on file  Occupational History  . 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 Smoking status: Never Smoker  . Smokeless tobacco: Never Used  Substance and Sexual Activity  . Alcohol use: No  . Drug use: No  . Sexual activity: No  Other Topics Concern  . Not on file  Social History Narrative  . Not on file   Family History  Problem Relation Age of Onset  . Asthma Maternal Grandmother   . Breast cancer Maternal Grandmother        dx. 34s  . Stomach cancer Maternal Grandmother 60       lots of stomach problems  . Heart attack Paternal Grandfather   . Other Daughter        cyst on ovary; unilateral oophorectomy  . Heart attack Brother   . Cancer Maternal Aunt        unknown type  . Celiac disease Maternal Aunt   . Diabetes Maternal Uncle   . Colon cancer Cousin        dx. 50s-60s  . Colon cancer Paternal Aunt        dx. late 20s  . Lung cancer Paternal Aunt        dx. late 60s-70s; former smoker  . Heart attack Paternal Aunt   . Heart attack Paternal Uncle       Review of Systems  All other systems reviewed and are negative.      Objective:   Physical Exam  Constitutional: She is oriented to person, place, and time. She appears well-developed and well-nourished. No distress.  HENT:  Head: Normocephalic and atraumatic.  Right Ear: External ear normal.  Left Ear: External ear normal.  Nose: Nose normal.  Mouth/Throat: Oropharynx is clear and moist. No oropharyngeal exudate.  Eyes: Conjunctivae and EOM are normal. Pupils are equal, round, and reactive to light. Right eye exhibits no discharge. Left eye exhibits no discharge. No scleral icterus.  Neck: Normal range  of motion. Neck supple. No JVD present. No tracheal deviation present. No thyromegaly present.  Cardiovascular: Normal rate, regular rhythm and intact distal pulses. Exam  reveals no gallop and no friction rub.  No murmur heard. Pulmonary/Chest: Effort normal and breath sounds normal. No stridor. No respiratory distress. She has no wheezes. She has no rales. She exhibits no tenderness.  Abdominal: Soft. Bowel sounds are normal. She exhibits no distension and no mass. There is no tenderness. There is no rebound and no guarding.  Musculoskeletal: Normal range of motion. She exhibits no edema, tenderness or deformity.  Lymphadenopathy:    She has no cervical adenopathy.  Neurological: She is alert and oriented to person, place, and time. She has normal reflexes. No cranial nerve deficit. She exhibits normal muscle tone. Coordination normal.  Skin: Skin is warm. No rash noted. She is not diaphoretic. No erythema. No pallor.  Psychiatric: She has a normal mood and affect. Her behavior is normal. Judgment and thought content normal.  Vitals reviewed.         Assessment & Plan:  Controlled type 2 diabetes mellitus without complication, without long-term current use of insulin (HCC) - Plan: Hemoglobin A1c, Lipid panel  Essential hypertension  Patient received Pneumovax 23 today.  Her flu shot is up-to-date.  Her blood pressure is excellent.  I will check a hemoglobin A1c as well as a fasting lipid panel.  Goal hemoglobin A1c is less than 6.5.  Goal LDL cholesterol is less than 70.

## 2017-08-12 NOTE — Addendum Note (Signed)
Addended by: Shary Decamp B on: 08/12/2017 09:25 AM   Modules accepted: Orders

## 2017-08-13 LAB — HEMOGLOBIN A1C
Hgb A1c MFr Bld: 6.3 %{Hb} — ABNORMAL HIGH (ref <5.7)
Mean Plasma Glucose: 134 (calc)
eAG (mmol/L): 7.4 (calc)

## 2017-08-13 LAB — LIPID PANEL
Cholesterol: 126 mg/dL (ref <200)
HDL: 46 mg/dL — ABNORMAL LOW (ref >50)
LDL Cholesterol (Calc): 60 mg/dL
Non-HDL Cholesterol (Calc): 80 mg/dL (ref <130)
Total CHOL/HDL Ratio: 2.7 (calc) (ref <5.0)
Triglycerides: 116 mg/dL (ref <150)

## 2017-09-03 ENCOUNTER — Other Ambulatory Visit: Payer: Self-pay | Admitting: Family Medicine

## 2017-09-03 NOTE — Telephone Encounter (Signed)
Medication refilled per protocol. 

## 2017-09-16 ENCOUNTER — Other Ambulatory Visit: Payer: Self-pay | Admitting: Student

## 2017-09-16 ENCOUNTER — Encounter (HOSPITAL_COMMUNITY): Payer: Medicare Other | Admitting: Cardiology

## 2017-09-16 DIAGNOSIS — R0602 Shortness of breath: Secondary | ICD-10-CM

## 2017-09-16 DIAGNOSIS — E785 Hyperlipidemia, unspecified: Secondary | ICD-10-CM

## 2017-09-16 DIAGNOSIS — I428 Other cardiomyopathies: Secondary | ICD-10-CM

## 2017-10-02 ENCOUNTER — Other Ambulatory Visit: Payer: Self-pay | Admitting: Family Medicine

## 2017-10-04 ENCOUNTER — Encounter: Payer: Self-pay | Admitting: Family Medicine

## 2017-10-04 ENCOUNTER — Ambulatory Visit (INDEPENDENT_AMBULATORY_CARE_PROVIDER_SITE_OTHER): Payer: Medicare Other | Admitting: Family Medicine

## 2017-10-04 VITALS — BP 100/60 | HR 76 | Temp 98.2°F | Resp 18 | Ht 62.0 in | Wt 183.0 lb

## 2017-10-04 DIAGNOSIS — G8929 Other chronic pain: Secondary | ICD-10-CM | POA: Diagnosis not present

## 2017-10-04 DIAGNOSIS — M25561 Pain in right knee: Secondary | ICD-10-CM

## 2017-10-04 NOTE — Progress Notes (Signed)
Subjective:    Patient ID: [name], female    DOB: 09-26-1946, 71 y.o.   MRN: [id number]  HPI  Patient is a very pleasant 71 year old Caucasian female with a history of bilateral knee pain secondary to osteoarthritis. Her left knee is her biggest problem. She is getting regular cortisone injections in her left knee and is scheduled appointment to see her orthopedist to discuss a knee replacement for later in January. However today she is here discussing her right knee. She reports pain with ambulation. She feels a pain like someone stabbing her in her knee Every time she takes a step. Recently it was exacerbated by walking on it for at [employer]. She has a mild effusion today. There is no erythema or warmth. Patient is pain-free as long as she sitting. However standing and walking causes her pain limits her quality of life. She is requesting a cortisone injection in her right knee Past Medical History:  Diagnosis Date  . Allergy history unknown   . Arthritis   . Asthma   . Breast cancer ([place]) 1999; 2008   hx of bilateral breast cancer  . Breast cancer, left breast ([place]) 10/27/2012   3.2 cm ER positive, Her-2 negative, 1 node positive S/P mastectomy/TTRAM reconstruction 4/08  . Breast cancer, right breast ([place]) 10/27/2012   3.2 cm ER/PR positive, 1 node positive, Her-2 negative Rx w mastectomy, AC -T chemo, followed by aromatase inhibitor x 5 years dx 4/08  . Cancer [facility])    breast CA  . Diabetes ([place])   . DM2 (diabetes mellitus, type 2) ([place])   . Elevated cholesterol   . GERD (gastroesophageal reflux disease)   . Hiatal hernia   . Hypertension   . LBBB (left bundle branch block) 10/27/2012  . Leg cramps   . NICM (nonischemic cardiomyopathy) ([place])    a. Echo:  06/14/12: Poor endocardial definition, possible septal HK, EF 50-55%, normal wall motion, mild LAE ;  b.  [facility] 9/13:  normal cors, EF 35-40%,  c. Echo 7/14: EF 50-55%, mild LAE  . Other fatigue 01/01/2015   Past Surgical  History:  Procedure Laterality Date  . DILATION AND CURETTAGE OF UTERUS    . KNEE SURGERY     left  . LEFT HEART CATHETERIZATION WITH CORONARY ANGIOGRAM N/A 06/15/2012   Procedure: LEFT HEART CATHETERIZATION WITH CORONARY ANGIOGRAM;  Surgeon: Wellington Hampshire, MD;  Location: [facility] CATH [facility];  Service: Cardiovascular;  Laterality: N/A;  . MASTECTOMY     bilateral   Current Outpatient Medications on File Prior to Visit  Medication Sig Dispense Refill  . atorvastatin (LIPITOR) 20 MG tablet TAKE 2 TABLETS (40 MG TOTAL) BY MOUTH EVERY EVENING. 180 tablet 2  . Blood Glucose Monitoring Suppl (ONE TOUCH ULTRA 2) W/DEVICE KIT Check FBS qam - Dx:250.00 and she will need lancets (1 box) , strips (1 bottle = 100) and controls also thanks 1 each 0  . Calcium Carbonate-Vitamin D (CALCIUM-VITAMIN D) 500-200 MG-UNIT per tablet Take 1 tablet by mouth 2 (two) times daily with a meal.     . carvedilol (COREG) 12.5 MG tablet Take 1-1.5 tablets (12.5-18.75 mg total) by mouth as directed. One tab in the AM and one and one half tab in the PM 210 tablet 3  . cetirizine (ZYRTEC) 10 MG tablet Take 10 mg by mouth every evening.    Marland Kitchen DEXILANT 60 MG capsule TAKE ONE CAPSULE BY MOUTH EVERY DAY 90 capsule 3  . ELIQUIS 5  MG TABS tablet TAKE 1 TABLET (5 MG TOTAL) BY MOUTH 2 (TWO) TIMES DAILY. 180 tablet 3  . fluticasone (FLONASE) 50 MCG/ACT nasal spray USE 2 SPRAYS IN EACH NOSTRIL DAILY FOR 3 MONTHS 48 g 0  . furosemide (LASIX) 20 MG tablet TAKE 1 TABLET (20 MG TOTAL) BY MOUTH DAILY. 90 tablet 3  . glucose blood test strip accu check comfort curve Checks FBS qam 100 each 5  . losartan (COZAAR) 100 MG tablet Take 1 tablet (100 mg total) by mouth daily. 90 tablet 3  . meclizine (ANTIVERT) 25 MG tablet Take 1 tablet (25 mg total) by mouth 3 (three) times daily as needed for dizziness. 30 tablet 0  . spironolactone (ALDACTONE) 25 MG tablet TAKE 1 TABLET (25 MG TOTAL) BY MOUTH DAILY. 90 tablet 3  . vitamin C (ASCORBIC ACID) 500 MG  tablet Take 500 mg by mouth 2 (two) times daily.      . WELCHOL 625 MG tablet TAKE 3 TABLETS (1,875 MG TOTAL) BY MOUTH 2 (TWO) TIMES DAILY WITH A MEAL. 540 tablet 3  . zafirlukast (ACCOLATE) 20 MG tablet TAKE 1 TABLET (20 MG TOTAL) BY MOUTH 2 (TWO) TIMES DAILY. 180 tablet 4   No current facility-administered medications on file prior to visit.    Allergies  Allergen Reactions  . Adhesive [Tape] Other (See Comments)    Skin turns red   . Diphenhydramine Other (See Comments)    Pt feels wired   . Vicodin [Hydrocodone-Acetaminophen] Nausea And Vomiting   Social History   Socioeconomic History  . Marital status: Married    Spouse name: Not on file  . Number of children: 2  . Years of education: Not on file  . Highest education level: Not on file  Social Needs  . Financial resource strain: Not on file  . Food insecurity - worry: Not on file  . Food insecurity - inability: Not on file  . Transportation needs - medical: Not on file  . Transportation needs - non-medical: Not on file  Occupational History  . 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 Smoking status: Never Smoker  . Smokeless tobacco: Never Used  Substance and Sexual Activity  . Alcohol use: No  . Drug use: No  . Sexual activity: No  Other Topics Concern  . Not on file  Social History Narrative  . Not on file     Review of Systems  All other systems reviewed and are negative.      Objective:   Physical Exam  Cardiovascular: Normal rate, regular rhythm and normal heart sounds.  Pulmonary/Chest: Effort normal and breath sounds normal.  Musculoskeletal:       Right knee: She exhibits decreased range of motion, swelling and effusion. She exhibits no LCL laxity, normal meniscus and no MCL laxity. Tenderness found. Medial joint line and lateral joint line tenderness noted. No MCL, no LCL and no patellar tendon tenderness noted.  Vitals  reviewed.         Assessment & Plan:  Chronic pain of right knee  Patient has chronic right knee pain secondary to osteoarthritis. Using sterile technique, I injected the right knee with a mixture of 2 mL of lidocaine, 2 mL of Marcaine, and 2 mL of 40 mg per mL Kenalog. Patient tolerated procedure well without complication.

## 2017-10-21 DIAGNOSIS — M1711 Unilateral primary osteoarthritis, right knee: Secondary | ICD-10-CM | POA: Diagnosis not present

## 2017-10-21 DIAGNOSIS — M1712 Unilateral primary osteoarthritis, left knee: Secondary | ICD-10-CM | POA: Diagnosis not present

## 2017-12-10 ENCOUNTER — Ambulatory Visit (INDEPENDENT_AMBULATORY_CARE_PROVIDER_SITE_OTHER): Payer: Medicare Other | Admitting: Family Medicine

## 2017-12-10 ENCOUNTER — Encounter: Payer: Self-pay | Admitting: Family Medicine

## 2017-12-10 VITALS — BP 100/58 | HR 80 | Temp 98.0°F | Resp 18 | Ht 62.0 in | Wt 181.0 lb

## 2017-12-10 DIAGNOSIS — R252 Cramp and spasm: Secondary | ICD-10-CM

## 2017-12-10 DIAGNOSIS — G8929 Other chronic pain: Secondary | ICD-10-CM

## 2017-12-10 DIAGNOSIS — M25561 Pain in right knee: Secondary | ICD-10-CM | POA: Diagnosis not present

## 2017-12-10 DIAGNOSIS — R4789 Other speech disturbances: Secondary | ICD-10-CM

## 2017-12-10 MED ORDER — FLUTICASONE PROPIONATE 50 MCG/ACT NA SUSP: NASAL | 3 refills | Status: DC

## 2017-12-10 NOTE — Progress Notes (Signed)
Subjective:    Patient ID: [name], female    DOB: 11-Mar-1946, 72 y.o.   MRN: [id number]  HPI Patient presents today complaining of right knee pain. He has osteoarthritis in the right knee. She is requesting a cortisone injection however her last cortisone injection was just 2 months ago. It is too early to repeat a cortisone injection. She is also complaining of cramps in her extremities. She reports muscle cramps in her anterior shin, and her hamstring, and her toes occasionally.  Typically occur while stretching at night. She is tried numerous home remedies without benefit. She is also concerned about memory issues. She states that she is having a difficult time occasionally thinking of words she is intending to say. It is usually something trivial. Otherwise she demonstrates no short-term memory loss. She is having no difficulty balancing her finances. Today on exam, her Mini-Mental status exam is completely normal. Her speech is fluid. Her word choices are appropriate.    Past Medical History:  Diagnosis Date  . Allergy history unknown   . Arthritis   . Asthma   . Breast cancer ([place]) 1999; 2008   hx of bilateral breast cancer  . Breast cancer, left breast ([place]) 10/27/2012   3.2 cm ER positive, Her-2 negative, 1 node positive S/P mastectomy/TTRAM reconstruction 4/08  . Breast cancer, right breast ([place]) 10/27/2012   3.2 cm ER/PR positive, 1 node positive, Her-2 negative Rx w mastectomy, AC -T chemo, followed by aromatase inhibitor x 5 years dx 4/08  . Cancer [facility])    breast CA  . Diabetes ([place])   . DM2 (diabetes mellitus, type 2) ([place])   . Elevated cholesterol   . GERD (gastroesophageal reflux disease)   . Hiatal hernia   . Hypertension   . LBBB (left bundle branch block) 10/27/2012  . Leg cramps   . NICM (nonischemic cardiomyopathy) ([place])    a. Echo:  06/14/12: Poor endocardial definition, possible septal HK, EF 50-55%, normal wall motion, mild LAE ;  b.  [facility] 9/13:  normal cors,  EF 35-40%,  c. Echo 7/14: EF 50-55%, mild LAE  . Other fatigue 01/01/2015   Past Surgical History:  Procedure Laterality Date  . DILATION AND CURETTAGE OF UTERUS    . KNEE SURGERY     left  . LEFT HEART CATHETERIZATION WITH CORONARY ANGIOGRAM N/A 06/15/2012   Procedure: LEFT HEART CATHETERIZATION WITH CORONARY ANGIOGRAM;  Surgeon: Wellington Hampshire, MD;  Location: [place] CATH LAB;  Service: Cardiovascular;  Laterality: N/A;  . MASTECTOMY     bilateral   Current Outpatient Medications on File Prior to Visit  Medication Sig Dispense Refill  . atorvastatin (LIPITOR) 20 MG tablet TAKE 2 TABLETS (40 MG TOTAL) BY MOUTH EVERY EVENING. 180 tablet 2  . Blood Glucose Monitoring Suppl (ONE TOUCH ULTRA 2) W/DEVICE KIT Check FBS qam - Dx:250.00 and she will need lancets (1 box) , strips (1 bottle = 100) and controls also thanks 1 each 0  . Calcium Carbonate-Vitamin D (CALCIUM-VITAMIN D) 500-200 MG-UNIT per tablet Take 1 tablet by mouth 2 (two) times daily with a meal.     . carvedilol (COREG) 12.5 MG tablet Take 1-1.5 tablets (12.5-18.75 mg total) by mouth as directed. One tab in the AM and one and one half tab in the PM 210 tablet 3  . cetirizine (ZYRTEC) 10 MG tablet Take 10 mg by mouth every evening.    Marland Kitchen DEXILANT 60 MG capsule TAKE ONE CAPSULE BY MOUTH EVERY  DAY 90 capsule 3  . ELIQUIS 5 MG TABS tablet TAKE 1 TABLET (5 MG TOTAL) BY MOUTH 2 (TWO) TIMES DAILY. 180 tablet 3  . furosemide (LASIX) 20 MG tablet TAKE 1 TABLET (20 MG TOTAL) BY MOUTH DAILY. 90 tablet 3  . glucose blood test strip accu check comfort curve Checks FBS qam 100 each 5  . losartan (COZAAR) 100 MG tablet Take 1 tablet (100 mg total) by mouth daily. 90 tablet 3  . meclizine (ANTIVERT) 25 MG tablet Take 1 tablet (25 mg total) by mouth 3 (three) times daily as needed for dizziness. 30 tablet 0  . spironolactone (ALDACTONE) 25 MG tablet TAKE 1 TABLET (25 MG TOTAL) BY MOUTH DAILY. 90 tablet 3  . vitamin C (ASCORBIC ACID) 500 MG tablet Take  500 mg by mouth 2 (two) times daily.      . WELCHOL 625 MG tablet TAKE 3 TABLETS (1,875 MG TOTAL) BY MOUTH 2 (TWO) TIMES DAILY WITH A MEAL. 540 tablet 3  . zafirlukast (ACCOLATE) 20 MG tablet TAKE 1 TABLET (20 MG TOTAL) BY MOUTH 2 (TWO) TIMES DAILY. 180 tablet 4   No current facility-administered medications on file prior to visit.    Allergies  Allergen Reactions  . Adhesive [Tape] Other (See Comments)    Skin turns red   . Diphenhydramine Other (See Comments)    Pt feels wired   . Vicodin [Hydrocodone-Acetaminophen] Nausea And Vomiting   Social History   Socioeconomic History  . Marital status: Married    Spouse name: Not on file  . Number of children: 2  . Years of education: Not on file  . Highest education level: Not on file  Social Needs  . Financial resource strain: Not on file  . Food insecurity - worry: Not on file  . Food insecurity - inability: Not on file  . Transportation needs - medical: Not on file  . Transportation needs - non-medical: Not on file  Occupational History  . 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 Smoking status: Never Smoker  . Smokeless tobacco: Never Used  Substance and Sexual Activity  . Alcohol use: No  . Drug use: No  . Sexual activity: No  Other Topics Concern  . Not on file  Social History Narrative  . Not on file      Review of Systems  All other systems reviewed and are negative.      Objective:   Physical Exam  Constitutional: She is oriented to person, place, and time. She appears well-developed and well-nourished.  Cardiovascular: Normal rate, regular rhythm and normal heart sounds.  Pulmonary/Chest: Effort normal and breath sounds normal. No respiratory distress. She has no wheezes. She has no rales.  Musculoskeletal:       Right knee: She exhibits decreased range of motion. Tenderness found. Medial joint line and lateral joint line tenderness noted.    Neurological: She is alert and oriented to person, place, and time. No cranial nerve deficit. She exhibits normal muscle tone. Coordination normal.          Assessment & Plan:  Chronic pain of right knee  Word finding problem  Cramps, muscle, general  Patient is premature for cortisone injection in her right knee. She can return in one month and I'll gladly do this. We also discussed pursuing possible platelet rich plasma injections in the knee. I also recommended that she recheck her orthopedist and make  him aware that cortisone injections are no longer sufficiently managing her pain. Regarding her cramps, I recommended stretching her muscles every night prior to going to bed. I recommended ensuring that she is adequately hydrated. She could try tonic water with quinine. Also recommended starting a B complex multivitamin bid. If cramps do not improve, we could consider hydroxychloroquine. Regarding her word finding problem, I reassured the patient that her neurologic exam today is completely normal. I believe this is normal age-related memory issues that we all experience. I see nothing pathologic about her history. We can monitor this more closely and if symptoms worsen pursue additional testing.

## 2017-12-24 ENCOUNTER — Ambulatory Visit (INDEPENDENT_AMBULATORY_CARE_PROVIDER_SITE_OTHER): Payer: Medicare Other | Admitting: Family Medicine

## 2017-12-24 ENCOUNTER — Encounter: Payer: Self-pay | Admitting: Family Medicine

## 2017-12-24 VITALS — BP 110/64 | HR 90 | Temp 98.2°F | Resp 18 | Ht 62.0 in | Wt 181.0 lb

## 2017-12-24 DIAGNOSIS — G8929 Other chronic pain: Secondary | ICD-10-CM

## 2017-12-24 DIAGNOSIS — M25561 Pain in right knee: Secondary | ICD-10-CM

## 2017-12-24 DIAGNOSIS — M25562 Pain in left knee: Secondary | ICD-10-CM

## 2017-12-24 NOTE — Progress Notes (Signed)
Subjective:    Patient ID: [name], female    DOB: 1946-09-10, 72 y.o.   MRN: [id number]  HPI  12/10/17 Patient presents today complaining of right knee pain. He has osteoarthritis in the right knee. She is requesting a cortisone injection however her last cortisone injection was just 2 months ago. It is too early to repeat a cortisone injection. She is also complaining of cramps in her extremities. She reports muscle cramps in her anterior shin, and her hamstring, and her toes occasionally.  Typically occur while stretching at night. She is tried numerous home remedies without benefit. She is also concerned about memory issues. She states that she is having a difficult time occasionally thinking of words she is intending to say. It is usually something trivial. Otherwise she demonstrates no short-term memory loss. She is having no difficulty balancing her finances. Today on exam, her Mini-Mental status exam is completely normal. Her speech is fluid. Her word choices are appropriate.  At thattime, my plan was: Patient is premature for cortisone injection in her right knee. She can return in one month and I'll gladly do this. We also discussed pursuing possible platelet rich plasma injections in the knee. I also recommended that she recheck her orthopedist and make him aware that cortisone injections are no longer sufficiently managing her pain. Regarding her cramps, I recommended stretching her muscles every night prior to going to bed. I recommended ensuring that she is adequately hydrated. She could try tonic water with quinine. Also recommended starting a B complex multivitamin bid. If cramps do not improve, we could consider hydroxychloroquine. Regarding her word finding problem, I reassured the patient that her neurologic exam today is completely normal. I believe this is normal age-related memory issues that we all experience. I see nothing pathologic about her history. We can monitor this more  closely and if symptoms worsen pursue additional testing.    12/24/17 Patient returns today for knee injections.  She is complaining of bilateral knee pain.  Her right knee is slightly premature by 1 week however she is requesting to go ahead and proceed with cortisone injection.  She is checked in the platelet rich plasma injections as well as stem cell injections and found them to be too expensive.  Her orthopedist has deemed her not to be a candidate for knee replacement at this time  Past Medical History:  Diagnosis Date  . Allergy history unknown   . Arthritis   . Asthma   . Breast cancer ([place]) 1999; 2008   hx of bilateral breast cancer  . Breast cancer, left breast ([place]) 10/27/2012   3.2 cm ER positive, Her-2 negative, 1 node positive S/P mastectomy/TTRAM reconstruction 4/08  . Breast cancer, right breast ([place]) 10/27/2012   3.2 cm ER/PR positive, 1 node positive, Her-2 negative Rx w mastectomy, AC -T chemo, followed by aromatase inhibitor x 5 years dx 4/08  . Cancer [facility])    breast CA  . Diabetes ([place])   . DM2 (diabetes mellitus, type 2) ([place])   . Elevated cholesterol   . GERD (gastroesophageal reflux disease)   . Hiatal hernia   . Hypertension   . LBBB (left bundle branch block) 10/27/2012  . Leg cramps   . NICM (nonischemic cardiomyopathy) ([place])    a. Echo:  06/14/12: Poor endocardial definition, possible septal HK, EF 50-55%, normal wall motion, mild LAE ;  b.  [facility] 9/13:  normal cors, EF 35-40%,  c. Echo 7/14: EF 50-55%, mild  LAE  . Other fatigue 01/01/2015   Past Surgical History:  Procedure Laterality Date  . DILATION AND CURETTAGE OF UTERUS    . KNEE SURGERY     left  . LEFT HEART CATHETERIZATION WITH CORONARY ANGIOGRAM N/A 06/15/2012   Procedure: LEFT HEART CATHETERIZATION WITH CORONARY ANGIOGRAM;  Surgeon: Wellington Hampshire, MD;  Location: [place] CATH LAB;  Service: Cardiovascular;  Laterality: N/A;  . MASTECTOMY     bilateral   Current Outpatient Medications on File Prior  to Visit  Medication Sig Dispense Refill  . atorvastatin (LIPITOR) 20 MG tablet TAKE 2 TABLETS (40 MG TOTAL) BY MOUTH EVERY EVENING. 180 tablet 2  . Blood Glucose Monitoring Suppl (ONE TOUCH ULTRA 2) W/DEVICE KIT Check FBS qam - Dx:250.00 and she will need lancets (1 box) , strips (1 bottle = 100) and controls also thanks 1 each 0  . Calcium Carbonate-Vitamin D (CALCIUM-VITAMIN D) 500-200 MG-UNIT per tablet Take 1 tablet by mouth 2 (two) times daily with a meal.     . carvedilol (COREG) 12.5 MG tablet Take 1-1.5 tablets (12.5-18.75 mg total) by mouth as directed. One tab in the AM and one and one half tab in the PM 210 tablet 3  . cetirizine (ZYRTEC) 10 MG tablet Take 10 mg by mouth every evening.    Marland Kitchen DEXILANT 60 MG capsule TAKE ONE CAPSULE BY MOUTH EVERY DAY 90 capsule 3  . ELIQUIS 5 MG TABS tablet TAKE 1 TABLET (5 MG TOTAL) BY MOUTH 2 (TWO) TIMES DAILY. 180 tablet 3  . fluticasone (FLONASE) 50 MCG/ACT nasal spray USE 2 SPRAYS IN EACH NOSTRIL DAILY FOR 3 MONTHS 48 g 3  . furosemide (LASIX) 20 MG tablet TAKE 1 TABLET (20 MG TOTAL) BY MOUTH DAILY. 90 tablet 3  . glucose blood test strip accu check comfort curve Checks FBS qam 100 each 5  . losartan (COZAAR) 100 MG tablet Take 1 tablet (100 mg total) by mouth daily. 90 tablet 3  . meclizine (ANTIVERT) 25 MG tablet Take 1 tablet (25 mg total) by mouth 3 (three) times daily as needed for dizziness. 30 tablet 0  . spironolactone (ALDACTONE) 25 MG tablet TAKE 1 TABLET (25 MG TOTAL) BY MOUTH DAILY. 90 tablet 3  . vitamin C (ASCORBIC ACID) 500 MG tablet Take 500 mg by mouth 2 (two) times daily.      . WELCHOL 625 MG tablet TAKE 3 TABLETS (1,875 MG TOTAL) BY MOUTH 2 (TWO) TIMES DAILY WITH A MEAL. 540 tablet 3  . zafirlukast (ACCOLATE) 20 MG tablet TAKE 1 TABLET (20 MG TOTAL) BY MOUTH 2 (TWO) TIMES DAILY. 180 tablet 4   No current facility-administered medications on file prior to visit.    Allergies  Allergen Reactions  . Adhesive [Tape] Other (See  Comments)    Skin turns red   . Diphenhydramine Other (See Comments)    Pt feels wired   . Vicodin [Hydrocodone-Acetaminophen] Nausea And Vomiting   Social History   Socioeconomic History  . Marital status: Married    Spouse name: Not on file  . Number of children: 2  . Years of education: Not on file  . Highest education level: Not on file  Occupational History  . 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 Financial resource strain: Not on file  . Food insecurity:    Worry: Not on file    Inability: Not on file  . Transportation needs:  Medical: Not on file    Non-medical: Not on file  Tobacco Use  . Smoking status: Never Smoker  . Smokeless tobacco: Never Used  Substance and Sexual Activity  . Alcohol use: No  . Drug use: No  . Sexual activity: Never  Lifestyle  . Physical activity:    Days per week: Not on file    Minutes per session: Not on file  . Stress: Not on file  Relationships  . Social connections:    Talks on phone: Not on file    Gets together: Not on file    Attends religious service: Not on file    Active member of club or organization: Not on file    Attends meetings of clubs or organizations: Not on file    Relationship status: Not on file  . Intimate partner violence:    Fear of current or ex partner: Not on file    Emotionally abused: Not on file    Physically abused: Not on file    Forced sexual activity: Not on file  Other Topics Concern  . Not on file  Social History Narrative  . Not on file      Review of Systems  All other systems reviewed and are negative.      Objective:   Physical Exam  Constitutional: She is oriented to person, place, and time. She appears well-developed and well-nourished.  Cardiovascular: Normal rate, regular rhythm and normal heart sounds.  Pulmonary/Chest: Effort normal and breath sounds normal. No respiratory distress. She has no wheezes. She  has no rales.  Musculoskeletal:       Right knee: She exhibits decreased range of motion. Tenderness found. Medial joint line and lateral joint line tenderness noted.       Left knee: She exhibits decreased range of motion. Tenderness found. Medial joint line and lateral joint line tenderness noted.  Neurological: She is alert and oriented to person, place, and time. No cranial nerve deficit. She exhibits normal muscle tone. Coordination normal.          Assessment & Plan:  Chronic pain of right knee  Chronic pain of left knee  Using sterile technique, I injected the left knee with a mixture of 2 cc of lidocaine, 2 cc of Marcaine, and 2 cc of 40 mg/mL Kenalog.  Patient tolerated the procedure well without complication.  I then repeated the procedure for her right knee without complication.  Follow-up as needed

## 2017-12-28 ENCOUNTER — Encounter: Payer: Self-pay | Admitting: Family Medicine

## 2017-12-28 ENCOUNTER — Ambulatory Visit (INDEPENDENT_AMBULATORY_CARE_PROVIDER_SITE_OTHER): Payer: Medicare Other | Admitting: Family Medicine

## 2017-12-28 VITALS — BP 110/60 | HR 78 | Temp 98.0°F | Resp 18 | Ht 62.0 in | Wt 182.0 lb

## 2017-12-28 DIAGNOSIS — M25561 Pain in right knee: Secondary | ICD-10-CM

## 2017-12-28 DIAGNOSIS — G8929 Other chronic pain: Secondary | ICD-10-CM | POA: Diagnosis not present

## 2017-12-28 DIAGNOSIS — M25562 Pain in left knee: Secondary | ICD-10-CM | POA: Diagnosis not present

## 2017-12-28 MED ORDER — TRAMADOL HCL 50 MG PO TABS: 50.0000 mg | ORAL_TABLET | Freq: Three times a day (TID) | ORAL | 0 refills | Status: DC | PRN

## 2017-12-28 MED ORDER — PREDNISONE 20 MG PO TABS: ORAL_TABLET | ORAL | 0 refills | Status: DC

## 2017-12-28 NOTE — Progress Notes (Signed)
Subjective:    Patient ID: [name], female    DOB: Apr 20, 1946, 72 y.o.   MRN: [id number]  HPI  12/10/17 Patient presents today complaining of right knee pain. He has osteoarthritis in the right knee. She is requesting a cortisone injection however her last cortisone injection was just 2 months ago. It is too early to repeat a cortisone injection. She is also complaining of cramps in her extremities. She reports muscle cramps in her anterior shin, and her hamstring, and her toes occasionally.  Typically occur while stretching at night. She is tried numerous home remedies without benefit. She is also concerned about memory issues. She states that she is having a difficult time occasionally thinking of words she is intending to say. It is usually something trivial. Otherwise she demonstrates no short-term memory loss. She is having no difficulty balancing her finances. Today on exam, her Mini-Mental status exam is completely normal. Her speech is fluid. Her word choices are appropriate.  At thattime, my plan was: Patient is premature for cortisone injection in her right knee. She can return in one month and I'll gladly do this. We also discussed pursuing possible platelet rich plasma injections in the knee. I also recommended that she recheck her orthopedist and make him aware that cortisone injections are no longer sufficiently managing her pain. Regarding her cramps, I recommended stretching her muscles every night prior to going to bed. I recommended ensuring that she is adequately hydrated. She could try tonic water with quinine. Also recommended starting a B complex multivitamin bid. If cramps do not improve, we could consider hydroxychloroquine. Regarding her word finding problem, I reassured the patient that her neurologic exam today is completely normal. I believe this is normal age-related memory issues that we all experience. I see nothing pathologic about her history. We can monitor this more  closely and if symptoms worsen pursue additional testing.    12/24/17 Patient returns today for knee injections.  She is complaining of bilateral knee pain.  Her right knee is slightly premature by 1 week however she is requesting to go ahead and proceed with cortisone injection.  She is checked in the platelet rich plasma injections as well as stem cell injections and found them to be too expensive.  Her orthopedist has deemed her not to be a candidate for knee replacement at this time.  At that time, my plan was:  Using sterile technique, I injected the left knee with a mixture of 2 cc of lidocaine, 2 cc of Marcaine, and 2 cc of 40 mg/mL Kenalog.  Patient tolerated the procedure well without complication.  I then repeated the procedure for her right knee without complication.  Follow-up as needed  12/28/17 Patient saw no benefit yet from the cortisone injections particularly in her right knee.  Her left knee may be a little bit better.  She reports significant pain whenever she stands or walks.  She just saw her orthopedist in January and x-rays at that time did show osteoarthritis however they deferred surgical management.  She is unable to take NSAIDs because of her long-term use of Eliquis.  Tylenol is not helping her pain.  Past Medical History:  Diagnosis Date  . Allergy history unknown   . Arthritis   . Asthma   . Breast cancer ([place]) 1999; 2008   hx of bilateral breast cancer  . Breast cancer, left breast ([place]) 10/27/2012   3.2 cm ER positive, Her-2 negative, 1 node positive S/P mastectomy/TTRAM  reconstruction 4/08  . Breast cancer, right breast ([place]) 10/27/2012   3.2 cm ER/PR positive, 1 node positive, Her-2 negative Rx w mastectomy, AC -T chemo, followed by aromatase inhibitor x 5 years dx 4/08  . Cancer [facility])    breast CA  . Diabetes ([place])   . DM2 (diabetes mellitus, type 2) ([place])   . Elevated cholesterol   . GERD (gastroesophageal reflux disease)   . Hiatal hernia   . Hypertension    . LBBB (left bundle branch block) 10/27/2012  . Leg cramps   . NICM (nonischemic cardiomyopathy) ([place])    a. Echo:  06/14/12: Poor endocardial definition, possible septal HK, EF 50-55%, normal wall motion, mild LAE ;  b.  [facility] 9/13:  normal cors, EF 35-40%,  c. Echo 7/14: EF 50-55%, mild LAE  . Other fatigue 01/01/2015   Past Surgical History:  Procedure Laterality Date  . DILATION AND CURETTAGE OF UTERUS    . KNEE SURGERY     left  . LEFT HEART CATHETERIZATION WITH CORONARY ANGIOGRAM N/A 06/15/2012   Procedure: LEFT HEART CATHETERIZATION WITH CORONARY ANGIOGRAM;  Surgeon: Wellington Hampshire, MD;  Location: [place] CATH LAB;  Service: Cardiovascular;  Laterality: N/A;  . MASTECTOMY     bilateral   Current Outpatient Medications on File Prior to Visit  Medication Sig Dispense Refill  . atorvastatin (LIPITOR) 20 MG tablet TAKE 2 TABLETS (40 MG TOTAL) BY MOUTH EVERY EVENING. 180 tablet 2  . Blood Glucose Monitoring Suppl (ONE TOUCH ULTRA 2) W/DEVICE KIT Check FBS qam - Dx:250.00 and she will need lancets (1 box) , strips (1 bottle = 100) and controls also thanks 1 each 0  . Calcium Carbonate-Vitamin D (CALCIUM-VITAMIN D) 500-200 MG-UNIT per tablet Take 1 tablet by mouth 2 (two) times daily with a meal.     . carvedilol (COREG) 12.5 MG tablet Take 1-1.5 tablets (12.5-18.75 mg total) by mouth as directed. One tab in the AM and one and one half tab in the PM 210 tablet 3  . cetirizine (ZYRTEC) 10 MG tablet Take 10 mg by mouth every evening.    Marland Kitchen DEXILANT 60 MG capsule TAKE ONE CAPSULE BY MOUTH EVERY DAY 90 capsule 3  . ELIQUIS 5 MG TABS tablet TAKE 1 TABLET (5 MG TOTAL) BY MOUTH 2 (TWO) TIMES DAILY. 180 tablet 3  . fluticasone (FLONASE) 50 MCG/ACT nasal spray USE 2 SPRAYS IN EACH NOSTRIL DAILY FOR 3 MONTHS 48 g 3  . furosemide (LASIX) 20 MG tablet TAKE 1 TABLET (20 MG TOTAL) BY MOUTH DAILY. 90 tablet 3  . glucose blood test strip accu check comfort curve Checks FBS qam 100 each 5  . losartan (COZAAR)  100 MG tablet Take 1 tablet (100 mg total) by mouth daily. 90 tablet 3  . meclizine (ANTIVERT) 25 MG tablet Take 1 tablet (25 mg total) by mouth 3 (three) times daily as needed for dizziness. 30 tablet 0  . spironolactone (ALDACTONE) 25 MG tablet TAKE 1 TABLET (25 MG TOTAL) BY MOUTH DAILY. 90 tablet 3  . vitamin C (ASCORBIC ACID) 500 MG tablet Take 500 mg by mouth 2 (two) times daily.      . WELCHOL 625 MG tablet TAKE 3 TABLETS (1,875 MG TOTAL) BY MOUTH 2 (TWO) TIMES DAILY WITH A MEAL. 540 tablet 3  . zafirlukast (ACCOLATE) 20 MG tablet TAKE 1 TABLET (20 MG TOTAL) BY MOUTH 2 (TWO) TIMES DAILY. 180 tablet 4   No current facility-administered medications on file prior to  visit.    Allergies  Allergen Reactions  . Adhesive [Tape] Other (See Comments)    Skin turns red   . Diphenhydramine Other (See Comments)    Pt feels wired   . Vicodin [Hydrocodone-Acetaminophen] Nausea And Vomiting   Social History   Socioeconomic History  . Marital status: Married    Spouse name: Not on file  . Number of children: 2  . Years of education: Not on file  . Highest education level: Not on file  Occupational History  . 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 Financial resource strain: Not on file  . Food insecurity:    Worry: Not on file    Inability: Not on file  . Transportation needs:    Medical: Not on file    Non-medical: Not on file  Tobacco Use  . Smoking status: Never Smoker  . Smokeless tobacco: Never Used  Substance and Sexual Activity  . Alcohol use: No  . Drug use: No  . Sexual activity: Never  Lifestyle  . Physical activity:    Days per week: Not on file    Minutes per session: Not on file  . Stress: Not on file  Relationships  . Social connections:    Talks on phone: Not on file    Gets together: Not on file    Attends religious service: Not on file    Active member of club or organization: Not on file     Attends meetings of clubs or organizations: Not on file    Relationship status: Not on file  . Intimate partner violence:    Fear of current or ex partner: Not on file    Emotionally abused: Not on file    Physically abused: Not on file    Forced sexual activity: Not on file  Other Topics Concern  . Not on file  Social History Narrative  . Not on file      Review of Systems  All other systems reviewed and are negative.      Objective:   Physical Exam  Constitutional: She is oriented to person, place, and time. She appears well-developed and well-nourished.  Cardiovascular: Normal rate, regular rhythm and normal heart sounds.  Pulmonary/Chest: Effort normal and breath sounds normal. No respiratory distress. She has no wheezes. She has no rales.  Musculoskeletal:       Right knee: She exhibits decreased range of motion. Tenderness found. Medial joint line and lateral joint line tenderness noted.       Left knee: She exhibits decreased range of motion. Tenderness found. Medial joint line and lateral joint line tenderness noted.  Neurological: She is alert and oriented to person, place, and time. No cranial nerve deficit. She exhibits normal muscle tone. Coordination normal.          Assessment & Plan:  Chronic pain of right knee  Chronic pain of left knee  Patient can use tramadol 50 mg every 6 hours as needed for pain.  I will use a one-time prednisone taper pack in an effort to try to calm the inflammation further.  If pain is no better, I have recommended that she follow back up with orthopedic surgeon to discuss future surgical management.

## 2017-12-31 DIAGNOSIS — M1712 Unilateral primary osteoarthritis, left knee: Secondary | ICD-10-CM | POA: Diagnosis not present

## 2017-12-31 DIAGNOSIS — M1711 Unilateral primary osteoarthritis, right knee: Secondary | ICD-10-CM | POA: Diagnosis not present

## 2017-12-31 DIAGNOSIS — M170 Bilateral primary osteoarthritis of knee: Secondary | ICD-10-CM | POA: Diagnosis not present

## 2018-01-03 ENCOUNTER — Ambulatory Visit: Payer: Medicare Other | Admitting: Family Medicine

## 2018-01-14 ENCOUNTER — Telehealth (HOSPITAL_COMMUNITY): Payer: Self-pay | Admitting: *Deleted

## 2018-01-14 NOTE — Telephone Encounter (Signed)
Advanced Heart Failure Triage Encounter  Patient Name: [name]  Date of Call: 01/14/18  Problem:  Patient called and left message on triage line stating she would like to schedule an appointment to be seen for lower leg swelling that she has started having recently.  She is scheduled for knee surgery in a month and wants our clinic to see her first.   Plan:  I called patient back and spoke with her. She doesn't check weights daily but she said she has been sitting a lot so she's sure she has gained some.  She stated she tries to keep her legs elevated but her knee hurts too bad.  She stated the leg swelling as slowly started to get worse over the past month and wants to be seen sometime before her knee surgery.  I have scheduled her on the APP clinic for 4/25.  I advised her to call us back if she starts feeling worse and to continue to try and elevate legs. No further questions.   Darron Doom, RN

## 2018-01-20 DIAGNOSIS — M1711 Unilateral primary osteoarthritis, right knee: Secondary | ICD-10-CM | POA: Diagnosis not present

## 2018-01-26 ENCOUNTER — Other Ambulatory Visit (HOSPITAL_COMMUNITY): Payer: Self-pay | Admitting: Internal Medicine

## 2018-01-27 ENCOUNTER — Encounter (HOSPITAL_COMMUNITY): Payer: Self-pay

## 2018-01-27 ENCOUNTER — Ambulatory Visit (HOSPITAL_COMMUNITY)
Admission: RE | Admit: 2018-01-27 | Discharge: 2018-01-27 | Disposition: A | Discharge status: Home / Self Care | Payer: Medicare Other | Source: Ambulatory Visit | Attending: Internal Medicine | Admitting: Internal Medicine

## 2018-01-27 VITALS — BP 138/70 | HR 76 | Wt 181.0 lb

## 2018-01-27 DIAGNOSIS — I447 Left bundle-branch block, unspecified: Secondary | ICD-10-CM | POA: Diagnosis not present

## 2018-01-27 DIAGNOSIS — K219 Gastro-esophageal reflux disease without esophagitis: Secondary | ICD-10-CM | POA: Insufficient documentation

## 2018-01-27 DIAGNOSIS — I480 Paroxysmal atrial fibrillation: Secondary | ICD-10-CM | POA: Insufficient documentation

## 2018-01-27 DIAGNOSIS — I10 Essential (primary) hypertension: Secondary | ICD-10-CM | POA: Diagnosis not present

## 2018-01-27 DIAGNOSIS — Z79899 Other long term (current) drug therapy: Secondary | ICD-10-CM | POA: Insufficient documentation

## 2018-01-27 DIAGNOSIS — I428 Other cardiomyopathies: Secondary | ICD-10-CM | POA: Diagnosis not present

## 2018-01-27 DIAGNOSIS — E785 Hyperlipidemia, unspecified: Secondary | ICD-10-CM | POA: Insufficient documentation

## 2018-01-27 DIAGNOSIS — Z86711 Personal history of pulmonary embolism: Secondary | ICD-10-CM | POA: Diagnosis not present

## 2018-01-27 DIAGNOSIS — Z9013 Acquired absence of bilateral breasts and nipples: Secondary | ICD-10-CM | POA: Insufficient documentation

## 2018-01-27 DIAGNOSIS — J45909 Unspecified asthma, uncomplicated: Secondary | ICD-10-CM | POA: Insufficient documentation

## 2018-01-27 DIAGNOSIS — Z7901 Long term (current) use of anticoagulants: Secondary | ICD-10-CM | POA: Diagnosis not present

## 2018-01-27 DIAGNOSIS — Z853 Personal history of malignant neoplasm of breast: Secondary | ICD-10-CM | POA: Insufficient documentation

## 2018-01-27 DIAGNOSIS — M79673 Pain in unspecified foot: Secondary | ICD-10-CM | POA: Insufficient documentation

## 2018-01-27 DIAGNOSIS — I5022 Chronic systolic (congestive) heart failure: Secondary | ICD-10-CM | POA: Diagnosis not present

## 2018-01-27 DIAGNOSIS — E119 Type 2 diabetes mellitus without complications: Secondary | ICD-10-CM | POA: Insufficient documentation

## 2018-01-27 LAB — CBC
HCT: 35.1 % — ABNORMAL LOW (ref 36.0–46.0)
Hemoglobin: 11.8 g/dL — ABNORMAL LOW (ref 12.0–15.0)
MCH: 32.3 pg (ref 26.0–34.0)
MCHC: 33.6 g/dL (ref 30.0–36.0)
MCV: 96.2 fL (ref 78.0–100.0)
Platelets: 277 10*3/uL (ref 150–400)
RBC: 3.65 MIL/uL — AB (ref 3.87–5.11)
RDW: 12.9 % (ref 11.5–15.5)
WBC: 6.8 10*3/uL (ref 4.0–10.5)

## 2018-01-27 LAB — BRAIN NATRIURETIC PEPTIDE: B NATRIURETIC PEPTIDE 5: 35.5 pg/mL (ref 0.0–100.0)

## 2018-01-27 LAB — BASIC METABOLIC PANEL
Anion gap: 12 (ref 5–15)
BUN: 28 mg/dL — ABNORMAL HIGH (ref 6–20)
CHLORIDE: 104 mmol/L (ref 101–111)
CO2: 19 mmol/L — ABNORMAL LOW (ref 22–32)
Calcium: 9 mg/dL (ref 8.9–10.3)
Creatinine, Ser: 1.04 mg/dL — ABNORMAL HIGH (ref 0.44–1.00)
GFR calc Af Amer: >60 mL/min (ref >60)
GFR calc non Af Amer: 52 mL/min — ABNORMAL LOW (ref >60)
Glucose, Bld: 124 mg/dL — ABNORMAL HIGH (ref 65–99)
POTASSIUM: 4.1 mmol/L (ref 3.5–5.1)
SODIUM: 135 mmol/L (ref 135–145)

## 2018-01-27 LAB — FERRITIN: Ferritin: 69 ng/mL (ref 11–307)

## 2018-01-27 LAB — FOLATE: FOLATE: 56.4 ng/mL (ref >5.9)

## 2018-01-27 LAB — IRON AND TIBC
Iron: 97 ug/dL (ref 28–170)
Saturation Ratios: 27 % (ref 10.4–31.8)
TIBC: 364 ug/dL (ref 250–450)
UIBC: 267 ug/dL

## 2018-01-27 LAB — VITAMIN B12: VITAMIN B 12: 1180 pg/mL — AB (ref 180–914)

## 2018-01-27 LAB — URIC ACID: Uric Acid, Serum: 4.2 mg/dL (ref 2.3–6.6)

## 2018-01-27 NOTE — Progress Notes (Signed)
Patient ID: [name], female   DOB: 1945/12/22, 72 y.o.   MRN: [id number]    Advanced Heart Failure Clinic Note   PCP: Dr. Dennard Schaumann HF: Dr. Aundra Dubin   72 yo with history of HTN and type II diabetes presents for cardiology followup.  Back in 2009, she had an echo with normal EF.  She was admitted in 9/13 with chest pain and LBBB.  She had LHC showing no CAD but EF 35-40%.  Echo was a technically difficult study with EF reported as 50-55%.  She has been taking ARB and Coreg.  Repeat echo in 12/13 showed EF 40% with septal hypokinesis.  Cardiac MRI was done in 12/13 as well, with calculated EF 50%, global hypokinesis, and no delayed enhancement.  Echo in 7/15 showed EF 55-60% with mild MR and mild TR (no LBBB at this time).   In 3/16, she began to develop increased dyspnea.  She was seen by Truitt Merle and was noted to have a LBBB again. Echo in 3/16 showed EF down to 35-40%.  She was then seen in followup by Rosaria Ferries.  Alivecor on her phone showed a period of HR up to 160s that appeared to be atrial fibrillation.  She felt tachypalpitations. Coreg was increased and she was started on apixaban 5 mg bid. CPX in 4/16 showed a submaximal effort but normal spirometry and normal peak VO2.  She did have ST changes possibly concerning for ischemia (but has baseline LBBB so hard to interpret).    Cardiolite in 5/16 showed on ischemia or infarction.  Most recent echo in 2/17 showed EF 45-50%, diffuse hypokinesis, grade I diastolic dysfunction, normal RV size and systolic function.  Echo 1/18 with EF stable at 45%.   Today she returns for HF follow up. Overall feeling fine but having ongoing knee pain. Planning for knee replacement in June. Having foot pain.  Recently more short of breath with inclines. Denies PND/Orthopnea. No chest pain. No bleeding problems. Weight at home has been 170-173 pounds.. Denies SOB/PND/Orthopnea. Appetite ok. No fever or chills. Taking all medications.  Labs (9/13): TSH  4.884 (elevated), proBNP 155 Labs (10/13): K 3.6, creatinine 0.8 Labs (1/14): K 3.7, creatinine 0.8, BNP not elevated, ANA weakly positive, TSH normal Labs (7/14): BNP normal Labs (11/14): K 4.1, creatinine 0.73 Labs (10/15): K 4.2, creatinine 0.73, LDL 45 Labs (3/16): BNP 46, HCT 42.2, TSH normal Labs (4/16): K 4.1, creatinine 0.78 => 0.8, HCT 39.3 Labs (1/17): K 4.6, creatinine 1.06, hgb 13.1, BNP 22 Labs (10/17): LDL 49, HDL 37, K 4.8, creatinine 0.92, TSH normal, BNP 62 Labs (4/18): LDL 48, K 4.4, creatinine 0.91   PMH: 1. LBBB/IVCD: Now persistent.   2. Breast cancer s/p bilateral mastectomy. 3. GERD 4. Type II diabetes mellitus 5. HTN 6. Asthma 7. PE in 2001 8. Hyperlipidemia 9. Nonischemic Cardiomyopathy: Possible LBBB cardiomyopathy. Echo 8/09 with 60%.  Admitted in 9/13 with chest pain.  LHC showed no angiographic CAD and EF 35-40%.  Echo at that time was read as showing EF 50-55% but was a technically difficult study.  Echo (12/13): EF 40% with septal hypokinesis, normal RV size and systolic function, no significant valvular abnormalities.  Cardiac MRI (12/13): EF 50%, global hypokinesis, no delayed enhancement.  Echo (7/14) with EF 50-55%, mild LV dilation, mild LAE.  Echo (7/15) with EF 55-60%, mild MR and mild TR.  Echo (3/16) with EF 35-40%, septal-lateral dyssynchrony.  CPX (4/16) with RER 0.98 (submaximal), peak VO2 19.5, VE/VCO2  slope 29.4 => normal spirometry, normal peak VO2, but ST changes suggest possibility of ischemia.  Lexiscan Cardiolite (5/16) with no ischemia/infarction. - Echo (2/17) with EF 45-50%, diffuse hypokinesis, grade I diastolic dysfunction, normal RV size and systolic function.  - Echo (1/18) with EF 45%, septal-lateral dyssynchrony, septal hypokinesis, normal RV size and systolic function.  10. Supraclavicular lymphadenopathy: Stable by CT back to 2009, suspect benign.  11. Atrial fibrillation: Paroxysmal.  Holter (4/16) with rare PACs and PVCs, short  run 5 beats SVT.   SH: Married, lives in [facility], never smoked.  FH: Uncle with ? SCD.  Brother with ? SCD.    ROS: All systems reviewed and negative except as per HPI.   Current Outpatient Medications  Medication Sig Dispense Refill  . atorvastatin (LIPITOR) 20 MG tablet TAKE 2 TABLETS (40 MG TOTAL) BY MOUTH EVERY EVENING. 180 tablet 2  . Blood Glucose Monitoring Suppl (ONE TOUCH ULTRA 2) W/DEVICE KIT Check FBS qam - Dx:250.00 and she will need lancets (1 box) , strips (1 bottle = 100) and controls also thanks 1 each 0  . Calcium Carbonate-Vitamin D (CALCIUM-VITAMIN D) 500-200 MG-UNIT per tablet Take 1 tablet by mouth 2 (two) times daily with a meal.     . carvedilol (COREG) 12.5 MG tablet Take 1-1.5 tablets (12.5-18.75 mg total) by mouth as directed. One tab in the AM and one and one half tab in the PM 210 tablet 3  . cetirizine (ZYRTEC) 10 MG tablet Take 10 mg by mouth every evening.    Marland Kitchen DEXILANT 60 MG capsule TAKE ONE CAPSULE BY MOUTH EVERY DAY 90 capsule 3  . ELIQUIS 5 MG TABS tablet TAKE 1 TABLET (5 MG TOTAL) BY MOUTH 2 (TWO) TIMES DAILY. 180 tablet 3  . fluticasone (FLONASE) 50 MCG/ACT nasal spray USE 2 SPRAYS IN EACH NOSTRIL DAILY FOR 3 MONTHS 48 g 3  . furosemide (LASIX) 20 MG tablet TAKE 1 TABLET (20 MG TOTAL) BY MOUTH DAILY. 90 tablet 3  . glucose blood test strip accu check comfort curve Checks FBS qam 100 each 5  . losartan (COZAAR) 100 MG tablet Take 1 tablet (100 mg total) by mouth daily. 90 tablet 3  . meclizine (ANTIVERT) 25 MG tablet Take 1 tablet (25 mg total) by mouth 3 (three) times daily as needed for dizziness. 30 tablet 0  . predniSONE (DELTASONE) 20 MG tablet 3 tabs poqday 1-2, 2 tabs poqday 3-4, 1 tab poqday 5-6 12 tablet 0  . spironolactone (ALDACTONE) 25 MG tablet TAKE 1 TABLET BY MOUTH EVERY DAY 90 tablet 3  . traMADol (ULTRAM) 50 MG tablet Take 1 tablet (50 mg total) by mouth every 8 (eight) hours as needed. 30 tablet 0  . vitamin C (ASCORBIC  ACID) 500 MG tablet Take 500 mg by mouth 2 (two) times daily.      . WELCHOL 625 MG tablet TAKE 3 TABLETS (1,875 MG TOTAL) BY MOUTH 2 (TWO) TIMES DAILY WITH A MEAL. 540 tablet 3  . zafirlukast (ACCOLATE) 20 MG tablet TAKE 1 TABLET (20 MG TOTAL) BY MOUTH 2 (TWO) TIMES DAILY. 180 tablet 4   No current facility-administered medications for this encounter.     BP 138/70   Pulse 76   Wt 181 lb (82.1 kg)   SpO2 99%   BMI 33.11 kg/m    Wt Readings from Last 3 Encounters:  01/27/18 181 lb (82.1 kg)  12/28/17 182 lb (82.6 kg)  12/24/17 181 lb (82.1 kg)  General:  Well appearing. No resp difficulty HEENT: normal Neck: supple. no JVD. Carotids 2+ bilat; no bruits. No lymphadenopathy or thryomegaly appreciated. Cor: PMI nondisplaced. Regular rate & rhythm. No rubs, gallops or murmurs. Lungs: clear Abdomen: soft, nontender, nondistended. No hepatosplenomegaly. No bruits or masses. Good bowel sounds. Extremities: no cyanosis, clubbing, rash, RLE and LLE trce edema. Neuro: alert & orientedx3, cranial nerves grossly intact. moves all 4 extremities w/o difficulty. Affect pleasant  EKG : NSR 74 bpm LBBB 152 ms.   Assessment/Plan: 1. Nonischemic cardiomyopathy:   No CAD on cath 9/13 but EF 35-40% at that time.  ECG showed LBBB.  LBBB resolved and EF normalized.  However, in 3/16, patient had fatigue and dyspnea => repeat echo with EF back down to 35-40%, she now has a persistent LBBB. This may be a LBBB cardiomyopathy.  CPX in 4/16 was submaximal but showed normal spirometry an actually near normal peak VO2.  Last echo in 1/18 showed EF stable at 45% with septal-lateral dyssynchrony.   NYHA II--III. Repeat ECHO next week. If EF lower will need to bring back for follow up with Dr Aundra Dubin prior to knee surgery. . Check BMET today - Volume status stable. Continue current HF medications.  - Continue lasix 20 mg daily.  - Continue Coreg 12.5 qam/18.75 qpm (she cannot tolerate 18.75 bid due to  dizziness/fatigue).   - Continue losartan 100 mg daily.  - Continue spironolactone 25 mg daily. 2. Hyperlipidemia:  Continue atorvastatin 40 mg qhs.  Good lipids 4/18.   3. Atrial fibrillation: Paroxysmal.   In NSR today.   - Continue Eliquis 5 mg bid.  Check CBC today CBC today.  4. LBBB: Persistent and may be contributing to cardiomyopathy.  5. Foot pain- check uric acid.   Follow up in 3-4 months.   Vici Novick NP-C  01/27/2018

## 2018-01-27 NOTE — Patient Instructions (Signed)
Routine lab work today. Will notify you of abnormal results, otherwise no news is good news!  Will schedule you for an echocardiogram at [facility] Address: [address] ([place]), [place], [place] [address]  Phone: [phone] Their office will call you to schedule.  Follow up 3-4 months with Dr. Aundra Dubin.  ______________________________________________________________ [name] Code:  Take all medication as prescribed the day of your appointment. Bring all medications with you to your appointment.  Do the following things EVERYDAY: 1) Weigh yourself in the morning before breakfast. Write it down and keep it in a log. 2) Take your medicines as prescribed 3) Eat low salt foods-Limit salt (sodium) to 2000 mg per day.  4) Stay as active as you can everyday 5) Limit all fluids for the day to less than 2 liters

## 2018-01-28 ENCOUNTER — Ambulatory Visit (HOSPITAL_COMMUNITY): Payer: Medicare Other | Attending: Cardiovascular Disease

## 2018-01-28 ENCOUNTER — Other Ambulatory Visit: Payer: Self-pay

## 2018-01-28 ENCOUNTER — Other Ambulatory Visit: Payer: Self-pay | Admitting: Family Medicine

## 2018-01-28 DIAGNOSIS — I348 Other nonrheumatic mitral valve disorders: Secondary | ICD-10-CM | POA: Insufficient documentation

## 2018-01-28 DIAGNOSIS — E119 Type 2 diabetes mellitus without complications: Secondary | ICD-10-CM | POA: Insufficient documentation

## 2018-01-28 DIAGNOSIS — C50919 Malignant neoplasm of unspecified site of unspecified female breast: Secondary | ICD-10-CM | POA: Diagnosis not present

## 2018-01-28 DIAGNOSIS — E785 Hyperlipidemia, unspecified: Secondary | ICD-10-CM | POA: Diagnosis not present

## 2018-01-28 DIAGNOSIS — R0600 Dyspnea, unspecified: Secondary | ICD-10-CM | POA: Insufficient documentation

## 2018-01-28 DIAGNOSIS — I119 Hypertensive heart disease without heart failure: Secondary | ICD-10-CM | POA: Insufficient documentation

## 2018-01-28 DIAGNOSIS — I447 Left bundle-branch block, unspecified: Secondary | ICD-10-CM | POA: Diagnosis not present

## 2018-01-28 DIAGNOSIS — I428 Other cardiomyopathies: Secondary | ICD-10-CM | POA: Insufficient documentation

## 2018-01-28 DIAGNOSIS — I4891 Unspecified atrial fibrillation: Secondary | ICD-10-CM | POA: Insufficient documentation

## 2018-01-28 MED ORDER — ZOSTER VAC RECOMB ADJUVANTED 50 MCG/0.5ML IM SUSR: 0.5000 mL | Freq: Once | INTRAMUSCULAR | 1 refills | Status: AC

## 2018-01-30 ENCOUNTER — Other Ambulatory Visit: Payer: Self-pay | Admitting: Family Medicine

## 2018-01-31 DIAGNOSIS — M25561 Pain in right knee: Secondary | ICD-10-CM | POA: Diagnosis not present

## 2018-02-01 ENCOUNTER — Ambulatory Visit (INDEPENDENT_AMBULATORY_CARE_PROVIDER_SITE_OTHER): Payer: Medicare Other | Admitting: Family Medicine

## 2018-02-01 ENCOUNTER — Encounter: Payer: Self-pay | Admitting: Family Medicine

## 2018-02-01 VITALS — BP 110/70 | HR 74 | Temp 98.1°F | Resp 16 | Ht 62.0 in | Wt 180.0 lb

## 2018-02-01 DIAGNOSIS — Z0000 Encounter for general adult medical examination without abnormal findings: Secondary | ICD-10-CM

## 2018-02-01 DIAGNOSIS — E118 Type 2 diabetes mellitus with unspecified complications: Secondary | ICD-10-CM | POA: Diagnosis not present

## 2018-02-01 DIAGNOSIS — Z853 Personal history of malignant neoplasm of breast: Secondary | ICD-10-CM | POA: Diagnosis not present

## 2018-02-01 DIAGNOSIS — M8580 Other specified disorders of bone density and structure, unspecified site: Secondary | ICD-10-CM

## 2018-02-01 DIAGNOSIS — I10 Essential (primary) hypertension: Secondary | ICD-10-CM | POA: Diagnosis not present

## 2018-02-01 DIAGNOSIS — I428 Other cardiomyopathies: Secondary | ICD-10-CM | POA: Diagnosis not present

## 2018-02-01 DIAGNOSIS — Z1159 Encounter for screening for other viral diseases: Secondary | ICD-10-CM | POA: Diagnosis not present

## 2018-02-01 NOTE — Progress Notes (Signed)
Subjective:    Patient ID: [name], female    DOB: July 03, 1946, 72 y.o.   MRN: [id number]  HPI Patient has type 2 diabetes mellitus. She is currently on WelChol. She is on this medication because she cannot tolerate metformin. She also has chronic diarrhea.  She is overdue for a urine microalbumin.  She is also overdue for diabetic foot exam.  She is due for hemoglobin A1c.  Most recent hemoglobin A1c was adequately controlled in November.  She is here today for a complete physical exam.  She continues to suffer from bilateral knee pain, right greater than left.  She has met with orthopedics was recommended a right knee replacement that has been scheduled for later this summer. Immunization History  Administered Date(s) Administered  . Hep A / Hep B 11/01/2014  . Hepatitis A, Ped/Adol-2 Dose 05/24/2014  . Influenza Split 08/03/2006, 07/08/2007, 07/10/2010, 06/07/2012, 06/19/2013  . Influenza, High Dose Seasonal PF 06/10/2016, 06/16/2017  . Influenza,inj,quad, With Preservative 06/05/2017  . Influenza-Unspecified 07/18/2015  . Pneumococcal Conjugate-13 01/26/2014  . Pneumococcal Polysaccharide-23 07/08/2007, 10/05/2008, 08/12/2017  . Tdap 01/28/2017  . Zoster 10/05/2008   Last colonoscopy was 2014.  She is not due again until 2024.  She had a bone density test last year that showed osteopenia.  She is taking 1200 mg a day of calcium and 1000 units a day of vitamin D.  Because of her history, and her age, she does not require Pap smear.  She has a history of bilateral breast cancer.  She is status post bilateral mastectomy in 2008.  She does not require mammograms any further due to the absence of residual breast tissue Past Medical History:  Diagnosis Date  . Allergy history unknown   . Arthritis   . Asthma   . Breast cancer ([place]) 1999; 2008   hx of bilateral breast cancer  . Breast cancer, left breast ([place]) 10/27/2012   3.2 cm ER positive, Her-2 negative, 1 node positive S/P  mastectomy/TTRAM reconstruction 4/08  . Breast cancer, right breast ([place]) 10/27/2012   3.2 cm ER/PR positive, 1 node positive, Her-2 negative Rx w mastectomy, AC -T chemo, followed by aromatase inhibitor x 5 years dx 4/08  . Cancer [facility])    breast CA  . Diabetes ([place])   . DM2 (diabetes mellitus, type 2) ([place])   . Elevated cholesterol   . GERD (gastroesophageal reflux disease)   . Hiatal hernia   . Hypertension   . LBBB (left bundle branch block) 10/27/2012  . Leg cramps   . NICM (nonischemic cardiomyopathy) ([place])    a. Echo:  06/14/12: Poor endocardial definition, possible septal HK, EF 50-55%, normal wall motion, mild LAE ;  b.  [facility] 9/13:  normal cors, EF 35-40%,  c. Echo 7/14: EF 50-55%, mild LAE  . Other fatigue 01/01/2015   Past Surgical History:  Procedure Laterality Date  . DILATION AND CURETTAGE OF UTERUS    . KNEE SURGERY     left  . LEFT HEART CATHETERIZATION WITH CORONARY ANGIOGRAM N/A 06/15/2012   Procedure: LEFT HEART CATHETERIZATION WITH CORONARY ANGIOGRAM;  Surgeon: Wellington Hampshire, MD;  Location: [facility] CATH LAB;  Service: Cardiovascular;  Laterality: N/A;  . MASTECTOMY     bilateral   Current Outpatient Medications on File Prior to Visit  Medication Sig Dispense Refill  . atorvastatin (LIPITOR) 20 MG tablet TAKE 2 TABLETS (40 MG TOTAL) BY MOUTH EVERY EVENING. 180 tablet 2  . Blood Glucose Monitoring Suppl (ONE  TOUCH ULTRA 2) W/DEVICE KIT Check FBS qam - Dx:250.00 and she will need lancets (1 box) , strips (1 bottle = 100) and controls also thanks 1 each 0  . Calcium Carbonate-Vitamin D (CALCIUM-VITAMIN D) 500-200 MG-UNIT per tablet Take 1 tablet by mouth 2 (two) times daily with a meal.     . carvedilol (COREG) 12.5 MG tablet Take 1-1.5 tablets (12.5-18.75 mg total) by mouth as directed. One tab in the AM and one and one half tab in the PM 210 tablet 3  . cetirizine (ZYRTEC) 10 MG tablet Take 10 mg by mouth every evening.    . colesevelam (WELCHOL) 625 MG tablet TAKE 3  TABLETS (1,875 MG TOTAL) BY MOUTH 2 (TWO) TIMES DAILY WITH A MEAL. 540 tablet 3  . DEXILANT 60 MG capsule TAKE ONE CAPSULE BY MOUTH EVERY DAY 90 capsule 3  . ELIQUIS 5 MG TABS tablet TAKE 1 TABLET (5 MG TOTAL) BY MOUTH 2 (TWO) TIMES DAILY. 180 tablet 3  . fluticasone (FLONASE) 50 MCG/ACT nasal spray USE 2 SPRAYS IN EACH NOSTRIL DAILY FOR 3 MONTHS 48 g 3  . furosemide (LASIX) 20 MG tablet TAKE 1 TABLET (20 MG TOTAL) BY MOUTH DAILY. 90 tablet 3  . glucose blood test strip accu check comfort curve Checks FBS qam 100 each 5  . losartan (COZAAR) 100 MG tablet Take 1 tablet (100 mg total) by mouth daily. 90 tablet 3  . meclizine (ANTIVERT) 25 MG tablet Take 1 tablet (25 mg total) by mouth 3 (three) times daily as needed for dizziness. 30 tablet 0  . spironolactone (ALDACTONE) 25 MG tablet TAKE 1 TABLET BY MOUTH EVERY DAY 90 tablet 3  . traMADol (ULTRAM) 50 MG tablet Take 1 tablet (50 mg total) by mouth every 8 (eight) hours as needed. 30 tablet 0  . vitamin C (ASCORBIC ACID) 500 MG tablet Take 500 mg by mouth 2 (two) times daily.      . zafirlukast (ACCOLATE) 20 MG tablet TAKE 1 TABLET (20 MG TOTAL) BY MOUTH 2 (TWO) TIMES DAILY. 180 tablet 4   No current facility-administered medications on file prior to visit.    Allergies  Allergen Reactions  . Adhesive [Tape] Other (See Comments)    Skin turns red   . Diphenhydramine Other (See Comments)    Pt feels wired   . Vicodin [Hydrocodone-Acetaminophen] Nausea And Vomiting   Social History   Socioeconomic History  . Marital status: Married    Spouse name: Not on file  . Number of children: 2  . Years of education: Not on file  . Highest education level: Not on file  Occupational History  . 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 Financial resource strain: Not on file  . Food insecurity:    Worry: Not on file    Inability: Not on file  . Transportation needs:    Medical: Not on  file    Non-medical: Not on file  Tobacco Use  . Smoking status: Never Smoker  . Smokeless tobacco: Never Used  Substance and Sexual Activity  . Alcohol use: No  . Drug use: No  . Sexual activity: Never  Lifestyle  . Physical activity:    Days per week: Not on file    Minutes per session: Not on file  . Stress: Not on file  Relationships  . Social connections:    Talks on phone: Not on file  Gets together: Not on file    Attends religious service: Not on file    Active member of club or organization: Not on file    Attends meetings of clubs or organizations: Not on file    Relationship status: Not on file  . Intimate partner violence:    Fear of current or ex partner: Not on file    Emotionally abused: Not on file    Physically abused: Not on file    Forced sexual activity: Not on file  Other Topics Concern  . Not on file  Social History Narrative  . Not on file   Family History  Problem Relation Age of Onset  . Asthma Maternal Grandmother   . Breast cancer Maternal Grandmother        dx. 25s  . Stomach cancer Maternal Grandmother 60       lots of stomach problems  . Heart attack Paternal Grandfather   . Other Daughter        cyst on ovary; unilateral oophorectomy  . Heart attack Brother   . Cancer Maternal Aunt        unknown type  . Celiac disease Maternal Aunt   . Diabetes Maternal Uncle   . Colon cancer Cousin        dx. 50s-60s  . Colon cancer Paternal Aunt        dx. late 26s  . Lung cancer Paternal Aunt        dx. late 60s-70s; former smoker  . Heart attack Paternal Aunt   . Heart attack Paternal Uncle       Review of Systems  All other systems reviewed and are negative.      Objective:   Physical Exam  Constitutional: She is oriented to person, place, and time. She appears well-developed and well-nourished. No distress.  HENT:  Head: Normocephalic and atraumatic.  Right Ear: External ear normal.  Left Ear: External ear normal.  Nose:  Nose normal.  Mouth/Throat: Oropharynx is clear and moist. No oropharyngeal exudate.  Eyes: Pupils are equal, round, and reactive to light. Conjunctivae and EOM are normal. Right eye exhibits no discharge. Left eye exhibits no discharge. No scleral icterus.  Neck: Normal range of motion. Neck supple. No JVD present. No tracheal deviation present. No thyromegaly present.  Cardiovascular: Normal rate, regular rhythm and intact distal pulses. Exam reveals no gallop and no friction rub.  No murmur heard. Pulmonary/Chest: Effort normal and breath sounds normal. No stridor. No respiratory distress. She has no wheezes. She has no rales. She exhibits no tenderness.  Abdominal: Soft. Bowel sounds are normal. She exhibits no distension and no mass. There is no tenderness. There is no rebound and no guarding.  Musculoskeletal: Normal range of motion. She exhibits no edema, tenderness or deformity.  Lymphadenopathy:    She has no cervical adenopathy.  Neurological: She is alert and oriented to person, place, and time. She has normal reflexes. No cranial nerve deficit. She exhibits normal muscle tone. Coordination normal.  Skin: Skin is warm. No rash noted. She is not diaphoretic. No erythema. No pallor.  Psychiatric: She has a normal mood and affect. Her behavior is normal. Judgment and thought content normal.  Vitals reviewed.         Assessment & Plan:  Controlled type 2 diabetes mellitus with complication, without long-term current use of insulin (HCC) - Plan: Lipid panel, Microalbumin, urine, Hemoglobin A1c  Encounter for hepatitis C screening test for low risk patient - Plan:  Hepatitis C Antibody  General medical exam  History of breast cancer  Osteopenia, unspecified location  Essential hypertension  Nonischemic cardiomyopathy ([place])  Patient's bone density is up-to-date and is not due again for 2 more years.  She is on calcium and vitamin D for osteopenia.  We both agree that mammograms  make little clinical sense as the patient has had all breast tissue removed. Colonoscopy is up-to-date. Due to her age she does not require colonoscopy.  Patient's immunizations are up-to-date except for the new shingles vaccine.  I recommended this but I encouraged her to check on the price prior to receiving it.  Patient does not require a Pap smear.  I will check a hemoglobin A1c to monitor for management of her diabetes mellitus type 2.  I will also check a urine microalbumin to check for diabetic nephropathy.  Check a fasting lipid panel.  Goal LDL cholesterol is less than 100.  Otherwise her chronic medical conditions are well controlled.  Recently saw cardiology who recommended an echocardiogram.  I have included these results below from a reference:  Study Conclusions  - Left ventricle: The cavity size was normal. Wall thickness was   increased in a pattern of mild LVH. Systolic function was normal.   The estimated ejection fraction was in the range of 50% to 55%.   Wall motion was normal; there were no regional wall motion   abnormalities. Doppler parameters are consistent with abnormal   left ventricular relaxation (grade 1 diastolic dysfunction). - Ventricular septum: Septal motion showed abnormal function and   dyssynergy. These changes are consistent with a left bundle   branch block. - Mitral valve: Mildly calcified annulus  She is currently on maximum dose losartan along with spironolactone.  She could not tolerate a beta-blocker due to her asthma.  At the present time, her blood pressure and her cardiomyopathy are well controlled.  Therefore I hesitate to make the switch to entresto.

## 2018-02-02 LAB — HEMOGLOBIN A1C
EAG (MMOL/L): 8.1 (calc)
HEMOGLOBIN A1C: 6.7 %{Hb} — AB (ref <5.7)
MEAN PLASMA GLUCOSE: 146 (calc)

## 2018-02-02 LAB — LIPID PANEL
CHOL/HDL RATIO: 2.5 (calc) (ref <5.0)
CHOLESTEROL: 118 mg/dL (ref <200)
HDL: 48 mg/dL — AB (ref >50)
LDL Cholesterol (Calc): 54 mg/dL (calc)
Non-HDL Cholesterol (Calc): 70 mg/dL (calc) (ref <130)
Triglycerides: 84 mg/dL (ref <150)

## 2018-02-02 LAB — MICROALBUMIN, URINE: Microalb, Ur: 0.5 mg/dL

## 2018-02-02 LAB — HEPATITIS C ANTIBODY
HEP C AB: NONREACTIVE
SIGNAL TO CUT-OFF: 0.02 (ref <1.00)

## 2018-02-03 ENCOUNTER — Telehealth (HOSPITAL_COMMUNITY): Payer: Self-pay | Admitting: *Deleted

## 2018-02-03 ENCOUNTER — Other Ambulatory Visit (HOSPITAL_COMMUNITY): Payer: Self-pay | Admitting: Internal Medicine

## 2018-02-03 NOTE — Telephone Encounter (Signed)
Result Notes for ECHOCARDIOGRAM COMPLETE   Notes recorded by Darron Doom, RN on 02/03/2018 at 12:31 PM EDT Called and spoke with patient, she is aware. No further questions. ------  Notes recorded by Darrick Grinder D, NP on 02/02/2018 at 12:18 PM EDT EF 50-55%. This has improved from the last echo. Please call

## 2018-02-09 DIAGNOSIS — E119 Type 2 diabetes mellitus without complications: Secondary | ICD-10-CM | POA: Diagnosis not present

## 2018-02-09 DIAGNOSIS — H2511 Age-related nuclear cataract, right eye: Secondary | ICD-10-CM | POA: Diagnosis not present

## 2018-02-09 DIAGNOSIS — H02831 Dermatochalasis of right upper eyelid: Secondary | ICD-10-CM | POA: Diagnosis not present

## 2018-02-09 DIAGNOSIS — H02834 Dermatochalasis of left upper eyelid: Secondary | ICD-10-CM | POA: Diagnosis not present

## 2018-02-09 DIAGNOSIS — H25812 Combined forms of age-related cataract, left eye: Secondary | ICD-10-CM | POA: Diagnosis not present

## 2018-02-09 DIAGNOSIS — H04123 Dry eye syndrome of bilateral lacrimal glands: Secondary | ICD-10-CM | POA: Diagnosis not present

## 2018-02-09 DIAGNOSIS — H1851 Endothelial corneal dystrophy: Secondary | ICD-10-CM | POA: Diagnosis not present

## 2018-02-21 ENCOUNTER — Other Ambulatory Visit (HOSPITAL_COMMUNITY): Payer: Self-pay | Admitting: *Deleted

## 2018-02-21 NOTE — Patient Instructions (Addendum)
[name]  02/21/2018   Your procedure is scheduled on: 03-07-18  Report to [facility] Main  Entrance  Report to admitting at 1000 AM    Call this number if you have problems the morning of surgery [phone]   Remember: Do not eat food OR DRINK LIQUIDS AFTER MIDNIGHT  _____________________________________________________________________ Take your welchol day before surgery 03-06-18, do not take your welchol day of surgery   Take these medicines the morning of surgery with A SIP OF WATER: CARVEDILOIL(COREG), FLONASE NASAL SPRAY IF NEEDED , DEXILANT, ACOLATE                               You may not have any metal on your body including hair pins and              piercings  Do not wear jewelry, make-up, lotions, powders or perfumes, deodorant             Do not wear nail polish.  Do not shave  48 hours prior to surgery.              Men may shave face and neck.   Do not bring valuables to the hospital. [place].  Contacts, dentures or bridgework may not be worn into surgery.  Leave suitcase in the car. After surgery it may be brought to your room.                  Please read over the following fact sheets you were given: _____________________________________________________________________   [facility] - Preparing for Surgery Before surgery, you can play an important role.  Because skin is not sterile, your skin needs to be as free of germs as possible.  You can reduce the number of germs on your skin by washing with CHG (chlorahexidine gluconate) soap before surgery.  CHG is an antiseptic cleaner which kills germs and bonds with the skin to continue killing germs even after washing. Please DO NOT use if you have an allergy to CHG or antibacterial soaps.  If your skin becomes reddened/irritated stop using the CHG and inform your nurse when you arrive at Short Stay. Do not shave (including legs and  underarms) for at least 48 hours prior to the first CHG shower.  You may shave your face/neck. Please follow these instructions carefully:  1.  Shower with CHG Soap the night before surgery and the  morning of Surgery.  2.  If you choose to wash your hair, wash your hair first as usual with your  normal  shampoo.  3.  After you shampoo, rinse your hair and body thoroughly to remove the  shampoo.                           4.  Use CHG as you would any other liquid soap.  You can apply chg directly  to the skin and wash                       Gently with a scrungie or clean washcloth.  5.  Apply the CHG Soap to your body ONLY FROM THE NECK DOWN.   Do not use  on face/ open                           Wound or open sores. Avoid contact with eyes, ears mouth and genitals (private parts).                       Wash face,  Genitals (private parts) with your normal soap.             6.  Wash thoroughly, paying special attention to the area where your surgery  will be performed.  7.  Thoroughly rinse your body with warm water from the neck down.  8.  DO NOT shower/wash with your normal soap after using and rinsing off  the CHG Soap.                9.  Pat yourself dry with a clean towel.            10.  Wear clean pajamas.            11.  Place clean sheets on your bed the night of your first shower and do not  sleep with pets. Day of Surgery : Do not apply any lotions/deodorants the morning of surgery.  Please wear clean clothes to the hospital/surgery center.  FAILURE TO FOLLOW THESE INSTRUCTIONS MAY RESULT IN THE CANCELLATION OF YOUR SURGERY PATIENT SIGNATURE_________________________________  NURSE SIGNATURE__________________________________  ________________________________________________________________________   [name]  An incentive spirometer is a tool that can help keep your lungs clear and active. This tool measures how well you are filling your lungs with each breath. Taking  long deep breaths may help reverse or decrease the chance of developing breathing (pulmonary) problems (especially infection) following:  A long period of time when you are unable to move or be active. BEFORE THE PROCEDURE   If the spirometer includes an indicator to show your best effort, your nurse or respiratory therapist will set it to a desired goal.  If possible, sit up straight or lean slightly forward. Try not to slouch.  Hold the incentive spirometer in an upright position. INSTRUCTIONS FOR USE  1. Sit on the edge of your bed if possible, or sit up as far as you can in bed or on a chair. 2. Hold the incentive spirometer in an upright position. 3. Breathe out normally. 4. Place the mouthpiece in your mouth and seal your lips tightly around it. 5. Breathe in slowly and as deeply as possible, raising the piston or the ball toward the top of the column. 6. Hold your breath for 3-5 seconds or for as long as possible. Allow the piston or ball to fall to the bottom of the column. 7. Remove the mouthpiece from your mouth and breathe out normally. 8. Rest for a few seconds and repeat Steps 1 through 7 at least 10 times every 1-2 hours when you are awake. Take your time and take a few normal breaths between deep breaths. 9. The spirometer may include an indicator to show your best effort. Use the indicator as a goal to work toward during each repetition. 10. After each set of 10 deep breaths, practice coughing to be sure your lungs are clear. If you have an incision (the cut made at the time of surgery), support your incision when coughing by placing a pillow or rolled up towels firmly against it. Once you are able to get out of bed, walk around  indoors and cough well. You may stop using the incentive spirometer when instructed by your caregiver.  RISKS AND COMPLICATIONS  Take your time so you do not get dizzy or light-headed.  If you are in pain, you may need to take or ask for pain  medication before doing incentive spirometry. It is harder to take a deep breath if you are having pain. AFTER USE  Rest and breathe slowly and easily.  It can be helpful to keep track of a log of your progress. Your caregiver can provide you with a simple table to help with this. If you are using the spirometer at home, follow these instructions: [place] IF:   You are having difficultly using the spirometer.  You have trouble using the spirometer as often as instructed.  Your pain medication is not giving enough relief while using the spirometer.  You develop fever of 100.5 F (38.1 C) or higher. SEEK IMMEDIATE MEDICAL CARE IF:   You cough up bloody sputum that had not been present before.  You develop fever of 102 F (38.9 C) or greater.  You develop worsening pain at or near the incision site. MAKE SURE YOU:   Understand these instructions.  Will watch your condition.  Will get help right away if you are not doing well or get worse. Document Released: 02/01/2007 Document Revised: 12/14/2011 Document Reviewed: 04/04/2007 ExitCare Patient Information 2014 ExitCare, [place].   ________________________________________________________________________  WHAT IS A BLOOD TRANSFUSION? Blood Transfusion Information  A transfusion is the replacement of blood or some of its parts. Blood is made up of multiple cells which provide different functions.  Red blood cells carry oxygen and are used for blood loss replacement.  White blood cells fight against infection.  Platelets control bleeding.  Plasma helps clot blood.  Other blood products are available for specialized needs, such as hemophilia or other clotting disorders. BEFORE THE TRANSFUSION  Who gives blood for transfusions?   Healthy volunteers who are fully evaluated to make sure their blood is safe. This is blood bank blood. Transfusion therapy is the safest it has ever been in the practice of medicine.  Before blood is taken from a donor, a complete history is taken to make sure that person has no history of diseases nor engages in risky social behavior (examples are intravenous drug use or sexual activity with multiple partners). The donor's travel history is screened to minimize risk of transmitting infections, such as malaria. The donated blood is tested for signs of infectious diseases, such as HIV and hepatitis. The blood is then tested to be sure it is compatible with you in order to minimize the chance of a transfusion reaction. If you or a relative donates blood, this is often done in anticipation of surgery and is not appropriate for emergency situations. It takes many days to process the donated blood. RISKS AND COMPLICATIONS Although transfusion therapy is very safe and saves many lives, the main dangers of transfusion include:   Getting an infectious disease.  Developing a transfusion reaction. This is an allergic reaction to something in the blood you were given. Every precaution is taken to prevent this. The decision to have a blood transfusion has been considered carefully by your caregiver before blood is given. Blood is not given unless the benefits outweigh the risks. AFTER THE TRANSFUSION  Right after receiving a blood transfusion, you will usually feel much better and more energetic. This is especially true if your red blood cells have gotten  low (anemic). The transfusion raises the level of the red blood cells which carry oxygen, and this usually causes an energy increase.  The nurse administering the transfusion will monitor you carefully for complications. HOME CARE INSTRUCTIONS  No special instructions are needed after a transfusion. You may find your energy is better. Speak with your caregiver about any limitations on activity for underlying diseases you may have. SEEK MEDICAL CARE IF:   Your condition is not improving after your transfusion.  You develop redness or  irritation at the intravenous (IV) site. SEEK IMMEDIATE MEDICAL CARE IF:  Any of the following symptoms occur over the next 12 hours:  Shaking chills.  You have a temperature by mouth above 102 F (38.9 C), not controlled by medicine.  Chest, back, or muscle pain.  People around you feel you are not acting correctly or are confused.  Shortness of breath or difficulty breathing.  Dizziness and fainting.  You get a rash or develop hives.  You have a decrease in urine output.  Your urine turns a dark color or changes to pink, red, or brown. Any of the following symptoms occur over the next 10 days:  You have a temperature by mouth above 102 F (38.9 C), not controlled by medicine.  Shortness of breath.  Weakness after normal activity.  The white part of the eye turns yellow (jaundice).  You have a decrease in the amount of urine or are urinating less often.  Your urine turns a dark color or changes to pink, red, or brown. Document Released: 09/18/2000 Document Revised: 12/14/2011 Document Reviewed: 05/07/2008 [facility] Patient Information 2014 [place], [place].  _______________________________________________________________________

## 2018-02-21 NOTE — Progress Notes (Signed)
MEDICAL CLEARANCE NOTE DR Rosiland Oz ON CHART FOR 03-07-18 SURGERY WITH DR ALUISI LOV CARDIOLOGY AMY KLEGG NP 01-27-18 Epic ECHO 02-03-18 Epic HEMAGLOBIN A1C 01-27-18 EPIC

## 2018-02-21 NOTE — Progress Notes (Signed)
LEFT [name] PHONE MESSAGE ANY INSTRUCTIONS TO STOP PLAVIX OR CARDIAC CLEARANCE NOTE, PLEASE FAX TO [phone] ATTENTION Brin Ruggerio

## 2018-02-21 NOTE — Progress Notes (Signed)
MEDICAL CLEARANCE NOTE DR Ovid Curd PICKARD 01-05-18 ON CHART FOR 03-07-18 SURGERY DR ALUISIO LOV NOTE AMY CLEGG NP CARDIOLOGY 01-27-18 Epic  ECHO 02-03-18 Epic HEAMGLOBIN A1C 01-27-18 EPIC

## 2018-02-23 ENCOUNTER — Other Ambulatory Visit: Payer: Self-pay | Admitting: Internal Medicine

## 2018-03-02 ENCOUNTER — Encounter (HOSPITAL_COMMUNITY)
Admission: RE | Admit: 2018-03-02 | Discharge: 2018-03-02 | Disposition: A | Discharge status: Home / Self Care | Payer: Medicare Other | Source: Ambulatory Visit | Attending: Orthopedic Surgery | Admitting: Orthopedic Surgery

## 2018-03-02 ENCOUNTER — Other Ambulatory Visit: Payer: Self-pay

## 2018-03-02 ENCOUNTER — Encounter (HOSPITAL_COMMUNITY): Payer: Self-pay

## 2018-03-02 DIAGNOSIS — Z01812 Encounter for preprocedural laboratory examination: Secondary | ICD-10-CM | POA: Insufficient documentation

## 2018-03-02 HISTORY — DX: Other pulmonary embolism without acute cor pulmonale: I26.99

## 2018-03-02 HISTORY — DX: Dyspnea, unspecified: R06.00

## 2018-03-02 LAB — COMPREHENSIVE METABOLIC PANEL
ALT: 18 U/L (ref 14–54)
AST: 16 U/L (ref 15–41)
Albumin: 3.9 g/dL (ref 3.5–5.0)
Alkaline Phosphatase: 57 U/L (ref 38–126)
Anion gap: 8 (ref 5–15)
BUN: 24 mg/dL — ABNORMAL HIGH (ref 6–20)
CO2: 23 mmol/L (ref 22–32)
Calcium: 8.9 mg/dL (ref 8.9–10.3)
Chloride: 106 mmol/L (ref 101–111)
Creatinine, Ser: 1.06 mg/dL — ABNORMAL HIGH (ref 0.44–1.00)
GFR calc Af Amer: 59 mL/min — ABNORMAL LOW (ref >60)
GFR calc non Af Amer: 51 mL/min — ABNORMAL LOW (ref >60)
Glucose, Bld: 104 mg/dL — ABNORMAL HIGH (ref 65–99)
Potassium: 4.3 mmol/L (ref 3.5–5.1)
Sodium: 137 mmol/L (ref 135–145)
Total Bilirubin: 0.5 mg/dL (ref 0.3–1.2)
Total Protein: 6.8 g/dL (ref 6.5–8.1)

## 2018-03-02 LAB — PROTIME-INR
INR: 1.19
Prothrombin Time: 15 seconds (ref 11.4–15.2)

## 2018-03-02 LAB — SURGICAL PCR SCREEN
MRSA, PCR: NEGATIVE
STAPHYLOCOCCUS AUREUS: NEGATIVE

## 2018-03-02 LAB — ABO/RH: ABO/RH(D): A POS

## 2018-03-02 LAB — CBC
HCT: 35 % — ABNORMAL LOW (ref 36.0–46.0)
Hemoglobin: 11.6 g/dL — ABNORMAL LOW (ref 12.0–15.0)
MCH: 32 pg (ref 26.0–34.0)
MCHC: 33.1 g/dL (ref 30.0–36.0)
MCV: 96.7 fL (ref 78.0–100.0)
Platelets: 294 10*3/uL (ref 150–400)
RBC: 3.62 MIL/uL — ABNORMAL LOW (ref 3.87–5.11)
RDW: 12.3 % (ref 11.5–15.5)
WBC: 8.1 10*3/uL (ref 4.0–10.5)

## 2018-03-02 LAB — APTT: aPTT: 28 seconds (ref 24–36)

## 2018-03-02 LAB — GLUCOSE, CAPILLARY: Glucose-Capillary: 124 mg/dL — ABNORMAL HIGH (ref 65–99)

## 2018-03-02 NOTE — Progress Notes (Signed)
Left message for velvet mcbride to fax and cardiac clearance to [facility]

## 2018-03-02 NOTE — Progress Notes (Addendum)
cmet results faxed to dr Wynelle Link by epic

## 2018-03-04 ENCOUNTER — Telehealth (HOSPITAL_COMMUNITY): Payer: Self-pay | Admitting: *Deleted

## 2018-03-04 ENCOUNTER — Other Ambulatory Visit: Payer: Self-pay | Admitting: Orthopedic Surgery

## 2018-03-04 NOTE — Care Plan (Signed)
Ortho Bundle R TKA scheduled on 03-07-18 DCP:  Home with spouse.  1 story home with ramp entrance. DME:  No needs.  Rx for RW given and has elevated toilets. PT:  EO.  PT eval scheduled for 03-11-18

## 2018-03-04 NOTE — H&P (Signed)
TOTAL KNEE ADMISSION H&P  Patient is being admitted for right total knee arthroplasty.  Subjective:  Chief Complaint:right knee pain.  HPI: [name], 73 y.o. female, has a history of pain and functional disability in the right knee due to arthritis and has failed non-surgical conservative treatments for greater than 12 weeks to includeNSAID's and/or analgesics, corticosteriod injections, flexibility and strengthening excercises and activity modification.  Onset of symptoms was gradual, starting 5 years ago with gradually worsening course since that time. The patient noted no past surgery on the right knee(s).  Patient currently rates pain in the right knee(s) at 8 out of 10 with activity. Patient has night pain, worsening of pain with activity and weight bearing, pain that interferes with activities of daily living, pain with passive range of motion, crepitus and joint swelling.  Patient has evidence of subchondral cysts, periarticular osteophytes and joint space narrowing by imaging studies. There is no active infection.  Patient Active Problem List   Diagnosis Date Noted  . Genetic testing 06/14/2015  . Paroxysmal atrial fibrillation ([place]) 02/12/2015  . Other fatigue 01/01/2015  . Cough 11/07/2012  . Asthma 11/07/2012  . LBBB (left bundle branch block) 10/27/2012  . Breast cancer, left breast ([place]) 10/27/2012  . Breast cancer, right breast ([place]) 10/27/2012  . Nonischemic cardiomyopathy ([place]) 09/21/2012  . Chest pain 07/20/2012  . DM w/o complication type II ([place]) 10/30/2011  . DYSPNEA 07/25/2009  . Hyperlipidemia 09/19/2008  . Essential hypertension 09/19/2008   Past Medical History:  Diagnosis Date  . Allergy history unknown   . Arthritis    oa  . Asthma    takes acolate for  . Breast cancer ([place]) 1999; 2008   hx of bilateral breast cancer  . Breast cancer, left breast ([place]) 10/27/2012   3.2 cm ER positive, Her-2 negative, 1 node positive S/P mastectomy/TTRAM  reconstruction 4/08  . Breast cancer, right breast ([place]) 10/27/2012   3.2 cm ER/PR positive, 1 node positive, Her-2 negative Rx w mastectomy, AC -T chemo, followed by aromatase inhibitor x 5 years dx 4/08  . Cancer [facility])    breast CA  . Diabetes ([place])   . DM2 (diabetes mellitus, type 2) ([place])    on welchol for  . Dyspnea    with exertion  . Elevated cholesterol   . GERD (gastroesophageal reflux disease)   . Hiatal hernia   . Hypertension   . LBBB (left bundle branch block) 10/27/2012  . Leg cramps   . NICM (nonischemic cardiomyopathy) ([place])    a. Echo:  06/14/12: Poor endocardial definition, possible septal HK, EF 50-55%, normal wall motion, mild LAE ;  b.  [facility] 9/13:  normal cors, EF 35-40%,  c. Echo 7/14: EF 50-55%, mild LAE  . Other fatigue 01/01/2015  . Pulmonary embolus ([place]) 2001   after tram flap surgery    Past Surgical History:  Procedure Laterality Date  . DILATION AND CURETTAGE OF UTERUS   49yr ago   x 2  . KNEE SURGERY  2006   left torn cartilage repair  . LEFT HEART CATHETERIZATION WITH CORONARY ANGIOGRAM N/A 06/15/2012   Procedure: LEFT HEART CATHETERIZATION WITH CORONARY ANGIOGRAM;  Surgeon: MWellington Hampshire MD;  [facility];  Service: Cardiovascular;  Laterality: N/A;  . MASTECTOMY     bilateral  . RECONSTRUCTION BREAST W/ TRAM FLAP  2001       Current Outpatient Medications  Medication Sig Dispense Refill Last Dose  . atorvastatin (LIPITOR) 20 MG  tablet TAKE 2 TABLETS (40 MG TOTAL) BY MOUTH EVERY EVENING. 180 tablet 2 Taking  . Calcium Carbonate-Vitamin D (CALCIUM-VITAMIN D) 500-200 MG-UNIT per tablet Take 1 tablet by mouth 2 (two) times daily with a meal.    Taking  . cetirizine (ZYRTEC) 10 MG tablet Take 10 mg by mouth at bedtime as needed for allergies.    Taking  . colesevelam (WELCHOL) 625 MG tablet TAKE 3 TABLETS (1,875 MG TOTAL) BY MOUTH 2 (TWO) TIMES DAILY WITH A MEAL. 540 tablet 3 Taking  . DEXILANT 60 MG capsule TAKE ONE CAPSULE BY MOUTH  EVERY DAY 90 capsule 3 Taking  . ELIQUIS 5 MG TABS tablet TAKE 1 TABLET BY MOUTH TWICE A DAY 180 tablet 3 03/01/2018 at 2000  . fluticasone (FLONASE) 50 MCG/ACT nasal spray USE 2 SPRAYS IN EACH NOSTRIL DAILY FOR 3 MONTHS (Patient taking differently: Place 2 sprays into both nostrils daily as needed (for allergies/congestion.). USE 2 SPRAYS IN EACH NOSTRIL DAILY FOR 3 MONTHS) 48 g 3 Taking  . furosemide (LASIX) 20 MG tablet TAKE 1 TABLET (20 MG TOTAL) BY MOUTH DAILY. 90 tablet 3 Taking  . losartan (COZAAR) 100 MG tablet Take 1 tablet (100 mg total) by mouth daily. 90 tablet 3 Taking  . spironolactone (ALDACTONE) 25 MG tablet TAKE 1 TABLET BY MOUTH EVERY DAY 90 tablet 3 Taking  . vitamin C (ASCORBIC ACID) 500 MG tablet Take 500 mg by mouth 2 (two) times daily.     Taking  . zafirlukast (ACCOLATE) 20 MG tablet TAKE 1 TABLET (20 MG TOTAL) BY MOUTH 2 (TWO) TIMES DAILY. 180 tablet 4 Taking  . Blood Glucose Monitoring Suppl (ONE TOUCH ULTRA 2) W/DEVICE KIT Check FBS qam - Dx:250.00 and she will need lancets (1 box) , strips (1 bottle = 100) and controls also thanks 1 each 0 Taking  . carvedilol (COREG) 12.5 MG tablet TAKE 1 TABLET IN THE MORNING AND 1.5 TABLETS IN THE EVENING 210 tablet 3   . glucose blood test strip accu check comfort curve Checks FBS qam 100 each 5 Taking  . meclizine (ANTIVERT) 25 MG tablet Take 1 tablet (25 mg total) by mouth 3 (three) times daily as needed for dizziness. 30 tablet 0 Taking  . traMADol (ULTRAM) 50 MG tablet Take 1 tablet (50 mg total) by mouth every 8 (eight) hours as needed. (Patient not taking: Reported on 02/21/2018) 30 tablet 0 Not Taking at Unknown time   Allergies  Allergen Reactions  . Adhesive [Tape] Other (See Comments)    Skin turns red   . Diphenhydramine Other (See Comments)    Pt feels wired   . Vicodin [Hydrocodone-Acetaminophen] Nausea And Vomiting    Social History   Tobacco Use  . Smoking status: Never Smoker  . Smokeless tobacco: Never Used   Substance Use Topics  . Alcohol use: No    Family History  Problem Relation Age of Onset  . Asthma Maternal Grandmother   . Breast cancer Maternal Grandmother        dx. 1s  . Stomach cancer Maternal Grandmother 60       lots of stomach problems  . Heart attack Paternal Grandfather   . Other Daughter        cyst on ovary; unilateral oophorectomy  . Heart attack Brother   . Cancer Maternal Aunt        unknown type  . Celiac disease Maternal Aunt   . Diabetes Maternal Uncle   . Colon cancer Cousin  dx. 50s-60s  . Colon cancer Paternal Aunt        dx. late 52s  . Lung cancer Paternal Aunt        dx. late 60s-70s; former smoker  . Heart attack Paternal Aunt   . Heart attack Paternal Uncle      Review of Systems  Constitutional: Negative.   HENT: Negative.   Eyes: Negative.   Respiratory: Negative.   Cardiovascular: Negative.   Gastrointestinal: Negative.   Genitourinary: Negative.   Musculoskeletal: Positive for joint pain and myalgias. Negative for back pain, falls and neck pain.  Skin: Negative.   Neurological: Negative.   Endo/Heme/Allergies: Negative.   Psychiatric/Behavioral: Negative.     Objective:  Physical Exam  Constitutional: She is oriented to person, place, and time. She appears well-developed. No distress.  Obese  HENT:  Head: Normocephalic and atraumatic.  Right Ear: External ear normal.  Left Ear: External ear normal.  Nose: Nose normal.  Mouth/Throat: Oropharynx is clear and moist.  Eyes: Conjunctivae and EOM are normal.  Neck: Normal range of motion. Neck supple.  Cardiovascular: Normal rate, regular rhythm, normal heart sounds and intact distal pulses.  No murmur heard. Respiratory: Effort normal and breath sounds normal. No respiratory distress. She has no wheezes.  GI: Soft. Bowel sounds are normal. She exhibits no distension. There is no tenderness.  Musculoskeletal:  Right Knee Exam:  No effusion.  Range of motion is 0-125  degrees.  No crepitus on range of motion of the knee.  Medial joint line tenderness. Slight lateral joint line tenderness.  Stable knee  Left Knee Exam:  No effusion.  Range of motion is 5-125 degrees.  Marked crepitus on range of motion of the knee.  Medial and lateral joint line tenderness, medial greater than lateral.  Stable knee.  Neurological: She is alert and oriented to person, place, and time. She has normal strength. No sensory deficit.  Skin: No rash noted. She is not diaphoretic. No erythema.  Psychiatric: She has a normal mood and affect. Her behavior is normal.    Ht: 5 ft 3 in  Wt: 170 lbs  BMI: 30.1  BP: 126/68 sitting R arm  Pulse: 72 bpm regular   Imaging Review Plain radiographs demonstrate severe degenerative joint disease of the right knee(s). The overall alignment ismild varus. The bone quality appears to be good for age and reported activity level.   Preoperative templating of the joint replacement has been completed, documented, and submitted to the Operating Room personnel in order to optimize intra-operative equipment management.   Anticipated LOS equal to or greater than 2 midnights due to - Age 58 and older with one or more of the following:  - Obesity  - Expected need for hospital services (PT, OT, Nursing) required for safe  discharge  - Anticipated need for postoperative skilled nursing care or inpatient rehab  - Active co-morbidities: Diabetes and hx of PE    Assessment/Plan:  End stage primary osteoarthritis, right knee   The patient history, physical examination, clinical judgment of the provider and imaging studies are consistent with end stage degenerative joint disease of the right knee(s) and total knee arthroplasty is deemed medically necessary. The treatment options including medical management, injection therapy arthroscopy and arthroplasty were discussed at length. The risks and benefits of total knee arthroplasty were presented and  reviewed. The risks due to aseptic loosening, infection, stiffness, patella tracking problems, thromboembolic complications and other imponderables were discussed. The patient acknowledged the explanation, agreed  to proceed with the plan and consent was signed. Patient is being admitted for inpatient treatment for surgery, pain control, PT, OT, prophylactic antibiotics, VTE prophylaxis, progressive ambulation and ADL's and discharge planning. The patient is planning to be discharged home    Therapy Plans: outpatient therapy at [facility] Disposition: Home with husband Planned DVT prophylaxis: resume Eliquis DME needed: has equipment PCP: Dr. Dennard Schaumann Cardio: Loralie Champagne Other: severe nausea and vomiting with "all pain medications"  Ardeen Jourdain, PA-C

## 2018-03-06 MED ORDER — TRANEXAMIC ACID 1000 MG/10ML IV SOLN
2000.0000 mg | Freq: Once | INTRAVENOUS | Status: AC
  Filled 2018-03-06: qty 20

## 2018-03-06 MED ORDER — BUPIVACAINE LIPOSOME 1.3 % IJ SUSP
20.0000 mL | Freq: Once | INTRAMUSCULAR | Status: DC
  Filled 2018-03-06: qty 20

## 2018-03-06 NOTE — Anesthesia Preprocedure Evaluation (Addendum)
Anesthesia Evaluation  Patient identified by MRN, date of birth, ID band Patient awake    Reviewed: Allergy & Precautions, H&P , NPO status , Patient's Chart, lab work & pertinent test results, reviewed documented beta blocker date and time   Airway Mallampati: II  TM Distance: >3 FB Neck ROM: Full    Dental no notable dental hx. (+) Teeth Intact, Dental Advisory Given   Pulmonary asthma ,    Pulmonary exam normal breath sounds clear to auscultation       Cardiovascular Exercise Tolerance: Good hypertension, Pt. on medications and Pt. on home beta blockers + dysrhythmias  Rhythm:Regular Rate:Normal     Neuro/Psych negative neurological ROS  negative psych ROS   GI/Hepatic Neg liver ROS, hiatal hernia, GERD  Medicated and Controlled,  Endo/Other  diabetes  Renal/GU negative Renal ROS  negative genitourinary   Musculoskeletal  (+) Arthritis , Osteoarthritis,    Abdominal   Peds  Hematology negative hematology ROS (+)   Anesthesia Other Findings   Reproductive/Obstetrics negative OB ROS                            Anesthesia Physical Anesthesia Plan  ASA: III  Anesthesia Plan: Spinal   Post-op Pain Management:  Regional for Post-op pain   Induction: Intravenous  PONV Risk Score and Plan: 3 and Ondansetron, Dexamethasone and Propofol infusion  Airway Management Planned: Simple Face Mask  Additional Equipment:   Intra-op Plan:   Post-operative Plan:   Informed Consent: I have reviewed the patients History and Physical, chart, labs and discussed the procedure including the risks, benefits and alternatives for the proposed anesthesia with the patient or authorized representative who has indicated his/her understanding and acceptance.   Dental advisory given  Plan Discussed with: CRNA  Anesthesia Plan Comments:         Anesthesia Quick Evaluation

## 2018-03-07 ENCOUNTER — Inpatient Hospital Stay (HOSPITAL_COMMUNITY): Payer: Medicare Other | Admitting: Anesthesiology

## 2018-03-07 ENCOUNTER — Inpatient Hospital Stay (HOSPITAL_COMMUNITY)
Admission: RE | Admit: 2018-03-07 | Discharge: 2018-03-12 | DRG: 470 | Disposition: A | Discharge status: Home Health | Payer: Medicare Other | Source: Ambulatory Visit | Attending: Orthopedic Surgery | Admitting: Orthopedic Surgery

## 2018-03-07 ENCOUNTER — Other Ambulatory Visit: Payer: Self-pay

## 2018-03-07 ENCOUNTER — Encounter (HOSPITAL_COMMUNITY)
Admission: RE | Disposition: A | Discharge status: Home Health | Payer: Self-pay | Source: Ambulatory Visit | Attending: Orthopedic Surgery

## 2018-03-07 ENCOUNTER — Encounter (HOSPITAL_COMMUNITY): Payer: Self-pay | Admitting: Emergency Medicine

## 2018-03-07 DIAGNOSIS — J45909 Unspecified asthma, uncomplicated: Secondary | ICD-10-CM | POA: Diagnosis present

## 2018-03-07 DIAGNOSIS — Z7901 Long term (current) use of anticoagulants: Secondary | ICD-10-CM

## 2018-03-07 DIAGNOSIS — Z9010 Acquired absence of unspecified breast and nipple: Secondary | ICD-10-CM | POA: Diagnosis not present

## 2018-03-07 DIAGNOSIS — Y92230 Patient room in hospital as the place of occurrence of the external cause: Secondary | ICD-10-CM | POA: Diagnosis not present

## 2018-03-07 DIAGNOSIS — K219 Gastro-esophageal reflux disease without esophagitis: Secondary | ICD-10-CM | POA: Diagnosis present

## 2018-03-07 DIAGNOSIS — Z853 Personal history of malignant neoplasm of breast: Secondary | ICD-10-CM

## 2018-03-07 DIAGNOSIS — R443 Hallucinations, unspecified: Secondary | ICD-10-CM | POA: Diagnosis present

## 2018-03-07 DIAGNOSIS — E785 Hyperlipidemia, unspecified: Secondary | ICD-10-CM | POA: Diagnosis not present

## 2018-03-07 DIAGNOSIS — T8482XS Fibrosis due to internal orthopedic prosthetic devices, implants and grafts, sequela: Secondary | ICD-10-CM | POA: Diagnosis present

## 2018-03-07 DIAGNOSIS — Z6832 Body mass index (BMI) 32.0-32.9, adult: Secondary | ICD-10-CM

## 2018-03-07 DIAGNOSIS — Z86711 Personal history of pulmonary embolism: Secondary | ICD-10-CM

## 2018-03-07 DIAGNOSIS — G8918 Other acute postprocedural pain: Secondary | ICD-10-CM | POA: Diagnosis not present

## 2018-03-07 DIAGNOSIS — M1710 Unilateral primary osteoarthritis, unspecified knee: Secondary | ICD-10-CM

## 2018-03-07 DIAGNOSIS — M1711 Unilateral primary osteoarthritis, right knee: Principal | ICD-10-CM | POA: Diagnosis present

## 2018-03-07 DIAGNOSIS — E119 Type 2 diabetes mellitus without complications: Secondary | ICD-10-CM | POA: Diagnosis present

## 2018-03-07 DIAGNOSIS — T402X5A Adverse effect of other opioids, initial encounter: Secondary | ICD-10-CM | POA: Diagnosis not present

## 2018-03-07 DIAGNOSIS — R410 Disorientation, unspecified: Secondary | ICD-10-CM | POA: Diagnosis not present

## 2018-03-07 DIAGNOSIS — I10 Essential (primary) hypertension: Secondary | ICD-10-CM | POA: Diagnosis present

## 2018-03-07 DIAGNOSIS — E669 Obesity, unspecified: Secondary | ICD-10-CM | POA: Diagnosis present

## 2018-03-07 DIAGNOSIS — M179 Osteoarthritis of knee, unspecified: Secondary | ICD-10-CM

## 2018-03-07 DIAGNOSIS — M79609 Pain in unspecified limb: Secondary | ICD-10-CM | POA: Diagnosis not present

## 2018-03-07 HISTORY — PX: TOTAL KNEE ARTHROPLASTY: SHX125

## 2018-03-07 LAB — TYPE AND SCREEN
ABO/RH(D): A POS
Antibody Screen: NEGATIVE

## 2018-03-07 LAB — GLUCOSE, CAPILLARY
GLUCOSE-CAPILLARY: 124 mg/dL — AB (ref 65–99)
GLUCOSE-CAPILLARY: 188 mg/dL — AB (ref 65–99)
GLUCOSE-CAPILLARY: 272 mg/dL — AB (ref 65–99)
Glucose-Capillary: 109 mg/dL — ABNORMAL HIGH (ref 65–99)
Glucose-Capillary: 149 mg/dL — ABNORMAL HIGH (ref 65–99)

## 2018-03-07 SURGERY — ARTHROPLASTY, KNEE, TOTAL
Anesthesia: Spinal | Site: Knee | Laterality: Right

## 2018-03-07 MED ORDER — ACETAMINOPHEN 500 MG PO TABS
1000.0000 mg | ORAL_TABLET | Freq: Four times a day (QID) | ORAL | Status: AC
  Administered 2018-03-07 – 2018-03-08 (×4): 1000 mg via ORAL
  Filled 2018-03-07 (×4): qty 2

## 2018-03-07 MED ORDER — FUROSEMIDE 20 MG PO TABS
20.0000 mg | ORAL_TABLET | Freq: Every day | ORAL | Status: DC
  Administered 2018-03-08 – 2018-03-12 (×5): 20 mg via ORAL
  Filled 2018-03-07 (×5): qty 1

## 2018-03-07 MED ORDER — FLUTICASONE PROPIONATE 50 MCG/ACT NA SUSP
2.0000 | Freq: Every day | NASAL | Status: DC | PRN
  Filled 2018-03-07: qty 16

## 2018-03-07 MED ORDER — FENTANYL CITRATE (PF) 100 MCG/2ML IJ SOLN
INTRAMUSCULAR | Status: DC | PRN
  Administered 2018-03-07 (×2): 50 ug via INTRAVENOUS

## 2018-03-07 MED ORDER — PHENOL 1.4 % MT LIQD: 1.0000 | OROMUCOSAL | Status: DC | PRN

## 2018-03-07 MED ORDER — PROMETHAZINE HCL 25 MG/ML IJ SOLN
INTRAMUSCULAR | Status: AC
  Administered 2018-03-07: 6.25 mg
  Filled 2018-03-07: qty 1

## 2018-03-07 MED ORDER — MENTHOL 3 MG MT LOZG: 1.0000 | LOZENGE | OROMUCOSAL | Status: DC | PRN

## 2018-03-07 MED ORDER — BISACODYL 10 MG RE SUPP: 10.0000 mg | Freq: Every day | RECTAL | Status: DC | PRN

## 2018-03-07 MED ORDER — SCOPOLAMINE 1 MG/3DAYS TD PT72
MEDICATED_PATCH | TRANSDERMAL | Status: AC
  Filled 2018-03-07: qty 1

## 2018-03-07 MED ORDER — HYDROMORPHONE HCL 2 MG PO TABS
4.0000 mg | ORAL_TABLET | ORAL | Status: DC | PRN
  Administered 2018-03-08 – 2018-03-09 (×4): 4 mg via ORAL
  Filled 2018-03-07 (×6): qty 2

## 2018-03-07 MED ORDER — METHOCARBAMOL 1000 MG/10ML IJ SOLN
500.0000 mg | Freq: Four times a day (QID) | INTRAMUSCULAR | Status: DC | PRN
  Administered 2018-03-07: 500 mg via INTRAVENOUS
  Filled 2018-03-07: qty 550

## 2018-03-07 MED ORDER — SCOPOLAMINE 1 MG/3DAYS TD PT72
MEDICATED_PATCH | TRANSDERMAL | Status: DC | PRN
  Administered 2018-03-07: 1 via TRANSDERMAL

## 2018-03-07 MED ORDER — SODIUM CHLORIDE 0.9 % IJ SOLN
INTRAMUSCULAR | Status: AC
Start: 2018-03-07 — End: ?
  Filled 2018-03-07: qty 50

## 2018-03-07 MED ORDER — SODIUM CHLORIDE 0.9 % IV SOLN
INTRAVENOUS | Status: DC
  Administered 2018-03-07: 11:00:00 via INTRAVENOUS

## 2018-03-07 MED ORDER — CARVEDILOL 12.5 MG PO TABS
12.5000 mg | ORAL_TABLET | Freq: Two times a day (BID) | ORAL | Status: DC
  Administered 2018-03-07 – 2018-03-12 (×9): 12.5 mg via ORAL
  Filled 2018-03-07 (×9): qty 1

## 2018-03-07 MED ORDER — LACTATED RINGERS IV SOLN: INTRAVENOUS | Status: DC

## 2018-03-07 MED ORDER — METOCLOPRAMIDE HCL 5 MG PO TABS: 5.0000 mg | ORAL_TABLET | Freq: Three times a day (TID) | ORAL | Status: DC | PRN

## 2018-03-07 MED ORDER — ATORVASTATIN CALCIUM 40 MG PO TABS
40.0000 mg | ORAL_TABLET | Freq: Every day | ORAL | Status: DC
  Administered 2018-03-07 – 2018-03-11 (×5): 40 mg via ORAL
  Filled 2018-03-07 (×5): qty 1

## 2018-03-07 MED ORDER — COLESEVELAM HCL 625 MG PO TABS
1875.0000 mg | ORAL_TABLET | Freq: Two times a day (BID) | ORAL | Status: DC
  Administered 2018-03-07 – 2018-03-12 (×10): 1875 mg via ORAL
  Filled 2018-03-07 (×11): qty 3

## 2018-03-07 MED ORDER — PANTOPRAZOLE SODIUM 40 MG PO TBEC
80.0000 mg | DELAYED_RELEASE_TABLET | Freq: Every day | ORAL | Status: DC
  Administered 2018-03-08 – 2018-03-12 (×5): 80 mg via ORAL
  Filled 2018-03-07 (×5): qty 2

## 2018-03-07 MED ORDER — PROPOFOL 10 MG/ML IV BOLUS
INTRAVENOUS | Status: AC
  Filled 2018-03-07: qty 60

## 2018-03-07 MED ORDER — SODIUM CHLORIDE 0.9 % IJ SOLN
INTRAMUSCULAR | Status: DC | PRN
  Administered 2018-03-07: 60 mL via INTRAVENOUS

## 2018-03-07 MED ORDER — HYDROMORPHONE HCL 2 MG PO TABS
2.0000 mg | ORAL_TABLET | ORAL | Status: DC | PRN
  Administered 2018-03-07 – 2018-03-08 (×6): 2 mg via ORAL
  Filled 2018-03-07 (×4): qty 1

## 2018-03-07 MED ORDER — INSULIN ASPART 100 UNIT/ML ~~LOC~~ SOLN
0.0000 [IU] | Freq: Three times a day (TID) | SUBCUTANEOUS | Status: DC
  Administered 2018-03-07: 3 [IU] via SUBCUTANEOUS
  Administered 2018-03-07: 2 [IU] via SUBCUTANEOUS
  Administered 2018-03-08 (×2): 3 [IU] via SUBCUTANEOUS
  Administered 2018-03-08: 8 [IU] via SUBCUTANEOUS
  Administered 2018-03-09: 3 [IU] via SUBCUTANEOUS
  Administered 2018-03-09 – 2018-03-10 (×3): 2 [IU] via SUBCUTANEOUS
  Administered 2018-03-10: 3 [IU] via SUBCUTANEOUS
  Administered 2018-03-11: 2 [IU] via SUBCUTANEOUS
  Administered 2018-03-11: 3 [IU] via SUBCUTANEOUS
  Administered 2018-03-12: 2 [IU] via SUBCUTANEOUS

## 2018-03-07 MED ORDER — ONDANSETRON HCL 4 MG/2ML IJ SOLN
INTRAMUSCULAR | Status: AC
  Filled 2018-03-07: qty 2

## 2018-03-07 MED ORDER — FLEET ENEMA 7-19 GM/118ML RE ENEM: 1.0000 | ENEMA | Freq: Once | RECTAL | Status: DC | PRN

## 2018-03-07 MED ORDER — PROPOFOL 500 MG/50ML IV EMUL
INTRAVENOUS | Status: DC | PRN
  Administered 2018-03-07: 25 ug/kg/min via INTRAVENOUS

## 2018-03-07 MED ORDER — SODIUM CHLORIDE 0.9 % IR SOLN
Status: DC | PRN
  Administered 2018-03-07: 1000 mL

## 2018-03-07 MED ORDER — ROPIVACAINE HCL 7.5 MG/ML IJ SOLN
INTRAMUSCULAR | Status: DC | PRN
  Administered 2018-03-07: 20 mL via PERINEURAL

## 2018-03-07 MED ORDER — BUPIVACAINE IN DEXTROSE 0.75-8.25 % IT SOLN
INTRATHECAL | Status: DC | PRN
  Administered 2018-03-07: 12 mg via INTRATHECAL

## 2018-03-07 MED ORDER — CEFAZOLIN SODIUM-DEXTROSE 2-4 GM/100ML-% IV SOLN
2.0000 g | INTRAVENOUS | Status: AC
  Administered 2018-03-07: 2 g via INTRAVENOUS
  Filled 2018-03-07: qty 100

## 2018-03-07 MED ORDER — PHENYLEPHRINE HCL 10 MG/ML IJ SOLN
INTRAMUSCULAR | Status: DC | PRN
  Administered 2018-03-07: 25 ug/min via INTRAVENOUS

## 2018-03-07 MED ORDER — APIXABAN 2.5 MG PO TABS
2.5000 mg | ORAL_TABLET | Freq: Two times a day (BID) | ORAL | Status: DC
  Administered 2018-03-08 – 2018-03-12 (×9): 2.5 mg via ORAL
  Filled 2018-03-07 (×9): qty 1

## 2018-03-07 MED ORDER — ACETAMINOPHEN 10 MG/ML IV SOLN
1000.0000 mg | Freq: Four times a day (QID) | INTRAVENOUS | Status: DC
  Administered 2018-03-07: 1000 mg via INTRAVENOUS
  Filled 2018-03-07: qty 100

## 2018-03-07 MED ORDER — METOCLOPRAMIDE HCL 5 MG/ML IJ SOLN: 5.0000 mg | Freq: Three times a day (TID) | INTRAMUSCULAR | Status: DC | PRN

## 2018-03-07 MED ORDER — SODIUM CHLORIDE 0.9 % IJ SOLN
INTRAMUSCULAR | Status: AC
  Filled 2018-03-07: qty 10

## 2018-03-07 MED ORDER — LORATADINE 10 MG PO TABS: 10.0000 mg | ORAL_TABLET | Freq: Every day | ORAL | Status: DC | PRN

## 2018-03-07 MED ORDER — DEXAMETHASONE SODIUM PHOSPHATE 10 MG/ML IJ SOLN
8.0000 mg | Freq: Once | INTRAMUSCULAR | Status: AC
  Administered 2018-03-07: 10 mg via INTRAVENOUS

## 2018-03-07 MED ORDER — STERILE WATER FOR IRRIGATION IR SOLN
Status: DC | PRN
  Administered 2018-03-07: 2000 mL

## 2018-03-07 MED ORDER — FENTANYL CITRATE (PF) 100 MCG/2ML IJ SOLN
INTRAMUSCULAR | Status: AC
  Filled 2018-03-07: qty 2

## 2018-03-07 MED ORDER — ONDANSETRON HCL 4 MG/2ML IJ SOLN
INTRAMUSCULAR | Status: AC
Start: 2018-03-07 — End: ?
  Filled 2018-03-07: qty 2

## 2018-03-07 MED ORDER — CEFAZOLIN SODIUM-DEXTROSE 2-4 GM/100ML-% IV SOLN
2.0000 g | Freq: Four times a day (QID) | INTRAVENOUS | Status: AC
  Administered 2018-03-07 (×2): 2 g via INTRAVENOUS
  Filled 2018-03-07 (×2): qty 100

## 2018-03-07 MED ORDER — ONDANSETRON HCL 4 MG/2ML IJ SOLN
INTRAMUSCULAR | Status: DC | PRN
  Administered 2018-03-07: 4 mg via INTRAVENOUS

## 2018-03-07 MED ORDER — MIDAZOLAM HCL 2 MG/2ML IJ SOLN
INTRAMUSCULAR | Status: AC
  Filled 2018-03-07: qty 2

## 2018-03-07 MED ORDER — TRAMADOL HCL 50 MG PO TABS
50.0000 mg | ORAL_TABLET | Freq: Four times a day (QID) | ORAL | Status: DC | PRN
  Administered 2018-03-08: 50 mg via ORAL
  Administered 2018-03-10: 100 mg via ORAL
  Filled 2018-03-07 (×2): qty 1
  Filled 2018-03-07: qty 2

## 2018-03-07 MED ORDER — ZAFIRLUKAST 20 MG PO TABS: 10.0000 mg | ORAL_TABLET | Freq: Two times a day (BID) | ORAL | Status: DC

## 2018-03-07 MED ORDER — LOSARTAN POTASSIUM 50 MG PO TABS
100.0000 mg | ORAL_TABLET | Freq: Every day | ORAL | Status: DC
  Administered 2018-03-08 – 2018-03-12 (×5): 100 mg via ORAL
  Filled 2018-03-07 (×5): qty 2

## 2018-03-07 MED ORDER — DEXAMETHASONE SODIUM PHOSPHATE 10 MG/ML IJ SOLN
INTRAMUSCULAR | Status: AC
Start: 2018-03-07 — End: ?
  Filled 2018-03-07: qty 1

## 2018-03-07 MED ORDER — METHOCARBAMOL 500 MG PO TABS
500.0000 mg | ORAL_TABLET | Freq: Four times a day (QID) | ORAL | Status: DC | PRN
  Administered 2018-03-07 – 2018-03-09 (×5): 500 mg via ORAL
  Filled 2018-03-07 (×6): qty 1

## 2018-03-07 MED ORDER — SPIRONOLACTONE 25 MG PO TABS
25.0000 mg | ORAL_TABLET | Freq: Every day | ORAL | Status: DC
  Administered 2018-03-08 – 2018-03-12 (×5): 25 mg via ORAL
  Filled 2018-03-07 (×5): qty 1

## 2018-03-07 MED ORDER — MIDAZOLAM HCL 5 MG/5ML IJ SOLN
INTRAMUSCULAR | Status: DC | PRN
  Administered 2018-03-07: 1 mg via INTRAVENOUS

## 2018-03-07 MED ORDER — ONDANSETRON HCL 4 MG/2ML IJ SOLN: 4.0000 mg | Freq: Four times a day (QID) | INTRAMUSCULAR | Status: DC | PRN

## 2018-03-07 MED ORDER — CHLORHEXIDINE GLUCONATE 4 % EX LIQD: 60.0000 mL | Freq: Once | CUTANEOUS | Status: DC

## 2018-03-07 MED ORDER — HYDROMORPHONE HCL 1 MG/ML IJ SOLN: 0.2500 mg | INTRAMUSCULAR | Status: DC | PRN

## 2018-03-07 MED ORDER — PROPOFOL 10 MG/ML IV BOLUS
INTRAVENOUS | Status: DC | PRN
  Administered 2018-03-07: 20 mg via INTRAVENOUS

## 2018-03-07 MED ORDER — ACETAMINOPHEN 325 MG PO TABS
325.0000 mg | ORAL_TABLET | Freq: Four times a day (QID) | ORAL | Status: AC | PRN
  Administered 2018-03-09 – 2018-03-10 (×4): 650 mg via ORAL
  Filled 2018-03-07 (×4): qty 2

## 2018-03-07 MED ORDER — DEXAMETHASONE SODIUM PHOSPHATE 10 MG/ML IJ SOLN
10.0000 mg | Freq: Once | INTRAMUSCULAR | Status: AC
  Administered 2018-03-08: 10 mg via INTRAVENOUS
  Filled 2018-03-07: qty 1

## 2018-03-07 MED ORDER — PHENYLEPHRINE HCL 10 MG/ML IJ SOLN
INTRAMUSCULAR | Status: AC
Start: 2018-03-07 — End: ?
  Filled 2018-03-07: qty 1

## 2018-03-07 MED ORDER — LACTATED RINGERS IV SOLN
INTRAVENOUS | Status: DC
  Administered 2018-03-07 (×2): via INTRAVENOUS

## 2018-03-07 MED ORDER — ONDANSETRON HCL 4 MG PO TABS
4.0000 mg | ORAL_TABLET | Freq: Four times a day (QID) | ORAL | Status: DC | PRN
  Administered 2018-03-09 (×2): 4 mg via ORAL
  Filled 2018-03-07 (×2): qty 1

## 2018-03-07 MED ORDER — MONTELUKAST SODIUM 10 MG PO TABS
10.0000 mg | ORAL_TABLET | Freq: Every day | ORAL | Status: DC
  Administered 2018-03-07 – 2018-03-11 (×5): 10 mg via ORAL
  Filled 2018-03-07 (×5): qty 1

## 2018-03-07 MED ORDER — BUPIVACAINE LIPOSOME 1.3 % IJ SUSP
INTRAMUSCULAR | Status: DC | PRN
  Administered 2018-03-07: 20 mL

## 2018-03-07 MED ORDER — POLYETHYLENE GLYCOL 3350 17 G PO PACK
17.0000 g | PACK | Freq: Every day | ORAL | Status: DC | PRN
  Administered 2018-03-09 – 2018-03-10 (×2): 17 g via ORAL
  Filled 2018-03-07 (×2): qty 1

## 2018-03-07 MED ORDER — TRANEXAMIC ACID 1000 MG/10ML IV SOLN
INTRAVENOUS | Status: DC | PRN
  Administered 2018-03-07: 2000 mg via TOPICAL

## 2018-03-07 MED ORDER — MECLIZINE HCL 25 MG PO TABS: 25.0000 mg | ORAL_TABLET | Freq: Three times a day (TID) | ORAL | Status: DC | PRN

## 2018-03-07 MED ORDER — DOCUSATE SODIUM 100 MG PO CAPS
100.0000 mg | ORAL_CAPSULE | Freq: Two times a day (BID) | ORAL | Status: DC
  Administered 2018-03-07 – 2018-03-12 (×10): 100 mg via ORAL
  Filled 2018-03-07 (×10): qty 1

## 2018-03-07 MED ORDER — HYDROMORPHONE HCL 1 MG/ML IJ SOLN: 0.5000 mg | INTRAMUSCULAR | Status: DC | PRN

## 2018-03-07 SURGICAL SUPPLY — 52 items
BAG DECANTER FOR FLEXI CONT (MISCELLANEOUS) ×2 IMPLANT
BAG ZIPLOCK 12X15 (MISCELLANEOUS) ×2 IMPLANT
BANDAGE ACE 6X5 VEL STRL LF (GAUZE/BANDAGES/DRESSINGS) ×2 IMPLANT
BLADE SAG 18X100X1.27 (BLADE) ×2 IMPLANT
BLADE SAW SGTL 11.0X1.19X90.0M (BLADE) ×2 IMPLANT
BOWL SMART MIX CTS (DISPOSABLE) ×2 IMPLANT
CAPT KNEE TOTAL 3 ATTUNE ×2 IMPLANT
CEMENT HV SMART SET (Cement) ×4 IMPLANT
COVER SURGICAL LIGHT HANDLE (MISCELLANEOUS) ×2 IMPLANT
CUFF TOURN SGL QUICK 34 (TOURNIQUET CUFF) ×1
CUFF TRNQT CYL 34X4X40X1 (TOURNIQUET CUFF) ×1 IMPLANT
DECANTER SPIKE VIAL GLASS SM (MISCELLANEOUS) ×2 IMPLANT
DRAPE U-SHAPE 47X51 STRL (DRAPES) ×2 IMPLANT
DRSG ADAPTIC 3X8 NADH LF (GAUZE/BANDAGES/DRESSINGS) ×2 IMPLANT
DRSG PAD ABDOMINAL 8X10 ST (GAUZE/BANDAGES/DRESSINGS) ×2 IMPLANT
DURAPREP 26ML APPLICATOR (WOUND CARE) ×2 IMPLANT
ELECT REM PT RETURN 15FT ADLT (MISCELLANEOUS) ×2 IMPLANT
EVACUATOR 1/8 PVC DRAIN (DRAIN) ×2 IMPLANT
GAUZE SPONGE 4X4 12PLY STRL (GAUZE/BANDAGES/DRESSINGS) ×2 IMPLANT
GLOVE BIO SURGEON STRL SZ8 (GLOVE) ×4 IMPLANT
GLOVE BIOGEL PI IND STRL 6.5 (GLOVE) ×6 IMPLANT
GLOVE BIOGEL PI IND STRL 8 (GLOVE) ×2 IMPLANT
GLOVE BIOGEL PI INDICATOR 6.5 (GLOVE) ×6
GLOVE BIOGEL PI INDICATOR 8 (GLOVE) ×2
GLOVE SURG SS PI 6.5 STRL IVOR (GLOVE) IMPLANT
GOWN STRL REUS W/TWL LRG LVL3 (GOWN DISPOSABLE) ×2 IMPLANT
GOWN STRL REUS W/TWL XL LVL3 (GOWN DISPOSABLE) ×8 IMPLANT
HANDPIECE INTERPULSE COAX TIP (DISPOSABLE) ×1
HOLDER FOLEY CATH W/STRAP (MISCELLANEOUS) IMPLANT
IMMOBILIZER KNEE 20 (SOFTGOODS) ×2
IMMOBILIZER KNEE 20 THIGH 36 (SOFTGOODS) ×1 IMPLANT
MANIFOLD NEPTUNE II (INSTRUMENTS) ×2 IMPLANT
NDL SAFETY ECLIPSE 18X1.5 (NEEDLE) IMPLANT
NEEDLE HYPO 18GX1.5 SHARP (NEEDLE)
NS IRRIG 1000ML POUR BTL (IV SOLUTION) ×2 IMPLANT
PACK TOTAL KNEE CUSTOM (KITS) ×2 IMPLANT
PADDING CAST COTTON 6X4 STRL (CAST SUPPLIES) ×2 IMPLANT
POSITIONER SURGICAL ARM (MISCELLANEOUS) ×2 IMPLANT
SET HNDPC FAN SPRY TIP SCT (DISPOSABLE) ×1 IMPLANT
STRIP CLOSURE SKIN 1/2X4 (GAUZE/BANDAGES/DRESSINGS) ×2 IMPLANT
SUT MNCRL AB 4-0 PS2 18 (SUTURE) ×2 IMPLANT
SUT STRATAFIX 0 PDS 27 VIOLET (SUTURE) ×2
SUT VIC AB 2-0 CT1 27 (SUTURE) ×3
SUT VIC AB 2-0 CT1 TAPERPNT 27 (SUTURE) ×3 IMPLANT
SUTURE STRATFX 0 PDS 27 VIOLET (SUTURE) ×1 IMPLANT
SYR 3ML LL SCALE MARK (SYRINGE) IMPLANT
SYR 50ML LL SCALE MARK (SYRINGE) ×2 IMPLANT
TRAY FOLEY CATH 14FR (SET/KITS/TRAYS/PACK) ×2 IMPLANT
TRAY FOLEY MTR SLVR 16FR STAT (SET/KITS/TRAYS/PACK) IMPLANT
WATER STERILE IRR 1000ML POUR (IV SOLUTION) ×4 IMPLANT
WRAP KNEE MAXI GEL POST OP (GAUZE/BANDAGES/DRESSINGS) ×2 IMPLANT
YANKAUER SUCT BULB TIP 10FT TU (MISCELLANEOUS) ×2 IMPLANT

## 2018-03-07 NOTE — Evaluation (Signed)
Physical Therapy Evaluation Patient Details Name: [name] MRN: [id number] DOB: Aug 05, 1946 Today's Date: 03/07/2018   History of Present Illness  72 yo female s/p R TKA 03/07/18.   Clinical Impression  On eval POD 0, pt required Mod assist for mobility. She initially declined to participate today on POD 0 however with some encouragement she agreed to sit EOB. Once EOB, pt agreed to stand and take a few lateral steps towards the [facility] with a RW. Moderate pain with activity. Per chart, plan is for pt to d/c home and f/u with outpatient PT. Will continue to follow and progress activity as tolerated.     Follow Up Recommendations Follow surgeon's recommendation for DC plan and follow-up therapies    Equipment Recommendations  Rolling walker with 5" wheels (if pt will agree to one)    Recommendations for Other Services       Precautions / Restrictions Precautions Precautions: Fall Required Braces or Orthoses: Knee Immobilizer - Right Knee Immobilizer - Right: Discontinue once straight leg raise with < 10 degree lag Restrictions Weight Bearing Restrictions: No Other Position/Activity Restrictions: WBAT      Mobility  Bed Mobility Overal bed mobility: Needs Assistance Bed Mobility: Supine to Sit     Supine to sit: Min assist     General bed mobility comments: Assist for R LE. Increased time. VCs technique, safety.   Transfers Overall transfer level: Needs assistance Equipment used: Rolling walker (2 wheeled) Transfers: Sit to/from Stand Sit to Stand: Mod assist         General transfer comment: Assist to rise, stabilize, control descent. VCs safety, technique, hand/LE placement. Increased time.   Ambulation/Gait Ambulation/Gait assistance: Mod assist   Assistive device: Rolling walker (2 wheeled)       General Gait Details: side steps along side of bed with the RW. VCS safety, technique, sequence. Pt not agreeable to ambulate in hallway on today.   Stairs            Wheelchair Mobility    Modified Rankin (Stroke Patients Only)       Balance Overall balance assessment: Needs assistance         Standing balance support: Bilateral upper extremity supported Standing balance-Leahy Scale: Poor                               Pertinent Vitals/Pain Pain Assessment: 0-10 Pain Score: 7  Pain Location: R knee Pain Descriptors / Indicators: Aching;Sore Pain Intervention(s): Monitored during session;Repositioned;Ice applied;Limited activity within patient's tolerance    Home Living Family/patient expects to be discharged to:: Private residence Living Arrangements: Spouse/significant other Available Help at Discharge: Family Type of Home: House Home Access: [place]: One [place]: Environmental consultant - 4 wheels;Walker - standard;Grab bars - toilet;Grab bars - tub/shower;Toilet riser      Prior Function Level of Independence: Independent               Hand Dominance        Extremity/Trunk Assessment   Upper Extremity Assessment Upper Extremity Assessment: Generalized weakness    Lower Extremity Assessment Lower Extremity Assessment: Generalized weakness(s/p R TKA)    Cervical / Trunk Assessment Cervical / Trunk Assessment: Normal  Communication   Communication: No difficulties  Cognition Arousal/Alertness: Awake/alert Behavior During Therapy: WFL for tasks assessed/performed Overall Cognitive Status: Within Functional Limits for tasks assessed  General Comments      Exercises     Assessment/Plan    PT Assessment Patient needs continued PT services  PT Problem List Decreased strength;Decreased range of motion;Decreased mobility;Decreased balance;Decreased knowledge of use of DME;Decreased activity tolerance;Pain       PT Treatment Interventions DME instruction;Gait training;Functional mobility training;Therapeutic  activities;Balance training;Patient/family education;Therapeutic exercise    PT Goals (Current goals can be found in the Care Plan section)  Acute Rehab PT Goals Patient Stated Goal: less pain.  PT Goal Formulation: With patient/family Time For Goal Achievement: 03/21/18 Potential to Achieve Goals: Good    Frequency 7X/week   Barriers to discharge        Co-evaluation               AM-PAC PT "6 Clicks" Daily Activity  Outcome Measure Difficulty turning over in bed (including adjusting bedclothes, sheets and blankets)?: Unable Difficulty moving from lying on back to sitting on the side of the bed? : Unable Difficulty sitting down on and standing up from a chair with arms (e.g., wheelchair, bedside commode, etc,.)?: Unable Help needed moving to and from a bed to chair (including a wheelchair)?: A Little Help needed walking in hospital room?: A Little Help needed climbing 3-5 steps with a railing? : A Lot 6 Click Score: 11    End of Session Equipment Utilized During Treatment: Gait belt Activity Tolerance: Patient limited by fatigue;Patient limited by pain Patient left: in bed;with call bell/phone within reach;with bed alarm set;with family/visitor present   PT Visit Diagnosis: Difficulty in walking, not elsewhere classified (R26.2);Pain;Muscle weakness (generalized) (M62.81) Pain - Right/Left: Right Pain - part of body: Knee    Time: 6294-7654 PT Time Calculation (min) (ACUTE ONLY): 19 min   Charges:   PT Evaluation $PT Eval Moderate Complexity: 1 Mod     PT G Codes:        Weston Anna, MPT Pager: [phone]

## 2018-03-07 NOTE — Progress Notes (Signed)
Pt's husband confirmed that pt last took eliquis on 03/03/18.   Dr. Ola Spurr notified that pts heartrate on monitor was in 140's. Palpated radial pulse and pulse was 80.

## 2018-03-07 NOTE — Anesthesia Procedure Notes (Signed)
Procedure Name: MAC Date/Time: 03/07/2018 7:08 AM Performed by: Lissa Morales, CRNA Pre-anesthesia Checklist: Patient identified, Emergency Drugs available, Suction available, Patient being monitored and Timeout performed Patient Re-evaluated:Patient Re-evaluated prior to induction Oxygen Delivery Method: Simple face mask Preoxygenation: Pre-oxygenation with 100% oxygen Placement Confirmation: positive ETCO2

## 2018-03-07 NOTE — Discharge Instructions (Addendum)
° °Dr. Frank Aluisio °Total Joint Specialist °[facility] °[address] °[place], [place] [address] °[phone] ° °TOTAL KNEE REPLACEMENT POSTOPERATIVE DIRECTIONS ° °Knee Rehabilitation, Guidelines Following Surgery  °Results after knee surgery are often greatly improved when you follow the exercise, range of motion and muscle strengthening exercises prescribed by your doctor. Safety measures are also important to protect the knee from further injury. Any time any of these exercises cause you to have increased pain or swelling in your knee joint, decrease the amount until you are comfortable again and slowly increase them. If you have problems or questions, call your caregiver or physical therapist for advice.  ° °HOME CARE INSTRUCTIONS  °Remove items at home which could result in a fall. This includes throw rugs or furniture in walking pathways.  °· ICE to the affected knee every three hours for 30 minutes at a time and then as needed for pain and swelling.  Continue to use ice on the knee for pain and swelling from surgery. You may notice swelling that will progress down to the foot and ankle.  This is normal after surgery.  Elevate the leg when you are not up walking on it.   °· Continue to use the breathing machine which will help keep your temperature down.  It is common for your temperature to cycle up and down following surgery, especially at night when you are not up moving around and exerting yourself.  The breathing machine keeps your lungs expanded and your temperature down. °· Do not place pillow under knee, focus on keeping the knee straight while resting ° °DIET °You may resume your previous home diet once your are discharged from the hospital. ° °DRESSING / WOUND CARE / SHOWERING °You may change your dressing every day with sterile gauze.  Please use good hand washing techniques before changing the dressing.  Do not use any lotions or creams on the incision until instructed by your  surgeon. °You may start showering once you are discharged home but do not submerge the incision under water. Just pat the incision dry and apply a dry gauze dressing on daily. °Change the surgical dressing daily and reapply a dry dressing each time. ° °ACTIVITY °Walk with your walker as instructed. °Use walker as long as suggested by your caregivers. °Avoid periods of inactivity such as sitting longer than an hour when not asleep. This helps prevent blood clots.  °You may resume a sexual relationship in one month or when given the OK by your doctor.  °You may return to work once you are cleared by your doctor.  °Do not drive a car for 6 weeks or until released by you surgeon.  °Do not drive while taking narcotics. ° °WEIGHT BEARING °Weight bearing as tolerated with assist device (walker, cane, etc) as directed, use it as long as suggested by your surgeon or therapist, typically at least 4-6 weeks. ° °POSTOPERATIVE CONSTIPATION PROTOCOL °Constipation - defined medically as fewer than three stools per week and severe constipation as less than one stool per week. ° °One of the most common issues patients have following surgery is constipation.  Even if you have a regular bowel pattern at home, your normal regimen is likely to be disrupted due to multiple reasons following surgery.  Combination of anesthesia, postoperative narcotics, change in appetite and fluid intake all can affect your bowels.  In order to avoid complications following surgery, here are some recommendations in order to help you during your recovery period. ° °  Colace (docusate) - Pick up an over-the-counter form of Colace or another stool softener and take twice a day as long as you are requiring postoperative pain medications.  Take with a full glass of water daily.  If you experience loose stools or diarrhea, hold the colace until you stool forms back up.  If your symptoms do not get better within 1 week or if they get worse, check with your  doctor. ° °Dulcolax (bisacodyl) - Pick up over-the-counter and take as directed by the product packaging as needed to assist with the movement of your bowels.  Take with a full glass of water.  Use this product as needed if not relieved by Colace only.  ° °MiraLax (polyethylene glycol) - Pick up over-the-counter to have on hand.  MiraLax is a solution that will increase the amount of water in your bowels to assist with bowel movements.  Take as directed and can mix with a glass of water, juice, soda, coffee, or tea.  Take if you go more than two days without a movement. °Do not use MiraLax more than once per day. Call your doctor if you are still constipated or irregular after using this medication for 7 days in a row. ° °If you continue to have problems with postoperative constipation, please contact the office for further assistance and recommendations.  If you experience "the worst abdominal pain ever" or develop nausea or vomiting, please contact the office immediatly for further recommendations for treatment. ° °ITCHING ° If you experience itching with your medications, try taking only a single pain pill, or even half a pain pill at a time.  You can also use Benadryl over the counter for itching or also to help with sleep.  ° °TED HOSE STOCKINGS °Wear the elastic stockings on both legs for three weeks following surgery during the day but you may remove then at night for sleeping. ° °MEDICATIONS °See your medication summary on the “After Visit Summary” that the nursing staff will review with you prior to discharge.  You may have some home medications which will be placed on hold until you complete the course of blood thinner medication.  It is important for you to complete the blood thinner medication as prescribed by your surgeon.  Continue your approved medications as instructed at time of discharge. ° °Gabapentin 300 mg Protocol °Take a 300 mg capsule three times a day for two weeks, °Then a 300 mg capsule  twice a day for two weeks, °Then a 300 mg capsule once a day for two weeks, then discontinue the Gabapentin. ° °PRECAUTIONS °If you experience chest pain or shortness of breath - call 911 immediately for transfer to the hospital emergency department.  °If you develop a fever greater that 101 F, purulent drainage from wound, increased redness or drainage from wound, foul odor from the wound/dressing, or calf pain - CONTACT YOUR SURGEON.   °                                                °FOLLOW-UP APPOINTMENTS °Make sure you keep all of your appointments after your operation with your surgeon and caregivers. You should call the office at the above phone number and make an appointment for approximately two weeks after the date of your surgery or on the date instructed by your surgeon outlined in the "After   Visit Summary". ° ° °RANGE OF MOTION AND STRENGTHENING EXERCISES  °Rehabilitation of the knee is important following a knee injury or an operation. After just a few days of immobilization, the muscles of the thigh which control the knee become weakened and shrink (atrophy). Knee exercises are designed to build up the tone and strength of the thigh muscles and to improve knee motion. Often times heat used for twenty to thirty minutes before working out will loosen up your tissues and help with improving the range of motion but do not use heat for the first two weeks following surgery. These exercises can be done on a training (exercise) mat, on the floor, on a table or on a bed. Use what ever works the best and is most comfortable for you Knee exercises include:  °Leg Lifts - While your knee is still immobilized in a splint or cast, you can do straight leg raises. Lift the leg to 60 degrees, hold for 3 sec, and slowly lower the leg. Repeat 10-20 times 2-3 times daily. Perform this exercise against resistance later as your knee gets better.  °Quad and Hamstring Sets - Tighten up the muscle on the front of the thigh  (Quad) and hold for 5-10 sec. Repeat this 10-20 times hourly. Hamstring sets are done by pushing the foot backward against an object and holding for 5-10 sec. Repeat as with quad sets.  °· Leg Slides: Lying on your back, slowly slide your foot toward your buttocks, bending your knee up off the floor (only go as far as is comfortable). Then slowly slide your foot back down until your leg is flat on the floor again. °· Angel Wings: Lying on your back spread your legs to the side as far apart as you can without causing discomfort.  °A rehabilitation program following serious knee injuries can speed recovery and prevent re-injury in the future due to weakened muscles. Contact your doctor or a physical therapist for more information on knee rehabilitation.  ° °IF YOU ARE TRANSFERRED TO A SKILLED REHAB FACILITY °If the patient is transferred to a skilled rehab facility following release from the hospital, a list of the current medications will be sent to the facility for the patient to continue.  When discharged from the skilled rehab facility, please have the facility set up the patient's Home Health Physical Therapy prior to being released. Also, the skilled facility will be responsible for providing the patient with their medications at time of release from the facility to include their pain medication, the muscle relaxants, and their blood thinner medication. If the patient is still at the rehab facility at time of the two week follow up appointment, the skilled rehab facility will also need to assist the patient in arranging follow up appointment in our office and any transportation needs. ° °MAKE SURE YOU:  °Understand these instructions.  °Get help right away if you are not doing well or get worse.  ° ° °Pick up stool softner and laxative for home use following surgery while on pain medications. °Do not submerge incision under water. °Please use good hand washing techniques while changing dressing each day. °May  shower starting three days after surgery. °Please use a clean towel to pat the incision dry following showers. °Continue to use ice for pain and swelling after surgery. °Do not use any lotions or creams on the incision until instructed by your surgeon. °

## 2018-03-07 NOTE — Anesthesia Procedure Notes (Signed)
Spinal  Patient location during procedure: OR Start time: 03/07/2018 7:13 AM End time: 03/07/2018 7:16 AM Staffing Anesthesiologist: Roderic Palau, MD Performed: anesthesiologist  Preanesthetic Checklist Completed: patient identified, surgical consent, pre-op evaluation, timeout performed, IV checked, risks and benefits discussed and monitors and equipment checked Spinal Block Patient position: sitting Prep: DuraPrep Patient monitoring: cardiac monitor, continuous pulse ox and blood pressure Approach: midline Location: L3-4 Injection technique: single-shot Needle Needle type: Pencan  Needle gauge: 24 G Needle length: 9 cm Assessment Sensory level: T8 Additional Notes Functioning IV was confirmed and monitors were applied. Sterile prep and drape, including hand hygiene and sterile gloves were used. The patient was positioned and the spine was prepped. The skin was anesthetized with lidocaine.  Free flow of clear CSF was obtained prior to injecting local anesthetic into the CSF.  The spinal needle aspirated freely following injection.  The needle was carefully withdrawn.  The patient tolerated the procedure well.

## 2018-03-07 NOTE — Interval H&P Note (Signed)
History and Physical Interval Note:  03/07/2018 6:32 AM  [name]  has presented today for surgery, with the diagnosis of Osteoarthritis Right Knee  The various methods of treatment have been discussed with the patient and family. After consideration of risks, benefits and other options for treatment, the patient has consented to  Procedure(s): RIGHT TOTAL KNEE ARTHROPLASTY (Right) as a surgical intervention .  The patient's history has been reviewed, patient examined, no change in status, stable for surgery.  I have reviewed the patient's chart and labs.  Questions were answered to the patient's satisfaction.     Pilar Plate Amra Shukla

## 2018-03-07 NOTE — Op Note (Signed)
OPERATIVE REPORT-TOTAL KNEE ARTHROPLASTY   Pre-operative diagnosis- Osteoarthritis  Right knee(s)  Post-operative diagnosis- Osteoarthritis Right knee(s)  Procedure-  Right  Total Knee Arthroplasty  Surgeon- Dione Plover. Nyliah Nierenberg, MD  Assistant- Molli Barrows, PA-C   Anesthesia-  Adductor canal block and spinal  EBL-50 mL   Drains Hemovac  Tourniquet time-  Total Tourniquet Time Documented: Thigh (Right) - 38 minutes Total: Thigh (Right) - 38 minutes     Complications- None  Condition-PACU - hemodynamically stable.   Brief Clinical Note  [name] is a 72 y.o. year old female with end stage OA of her right knee with progressively worsening pain and dysfunction. She has constant pain, with activity and at rest and significant functional deficits with difficulties even with ADLs. She has had extensive non-op management including analgesics, injections of cortisone and viscosupplements, and home exercise program, but remains in significant pain with significant dysfunction.Radiographs show bone on bone arthritis medial and patellofemoral. She presents now for right Total Knee Arthroplasty.    Procedure in detail---   The patient is brought into the operating room and positioned supine on the operating table. After successful administration of  Adductor canal block and spinal,   a tourniquet is placed high on the  Right thigh(s) and the lower extremity is prepped and draped in the usual sterile fashion. Time out is performed by the operating team and then the  Right lower extremity is wrapped in Esmarch, knee flexed and the tourniquet inflated to 300 mmHg.       A midline incision is made with a ten blade through the subcutaneous tissue to the level of the extensor mechanism. A fresh blade is used to make a medial parapatellar arthrotomy. Soft tissue over the proximal medial tibia is subperiosteally elevated to the joint line with a knife and into the semimembranosus bursa with a  Cobb elevator. Soft tissue over the proximal lateral tibia is elevated with attention being paid to avoiding the patellar tendon on the tibial tubercle. The patella is everted, knee flexed 90 degrees and the ACL and PCL are removed. Findings are bone on bone medial and patellofemoral with large global osteophytes.        The drill is used to create a starting hole in the distal femur and the canal is thoroughly irrigated with sterile saline to remove the fatty contents. The 5 degree Right  valgus alignment guide is placed into the femoral canal and the distal femoral cutting block is pinned to remove 9 mm off the distal femur. Resection is made with an oscillating saw.      The tibia is subluxed forward and the menisci are removed. The extramedullary alignment guide is placed referencing proximally at the medial aspect of the tibial tubercle and distally along the second metatarsal axis and tibial crest. The block is pinned to remove 55mm off the more deficient medial  side. Resection is made with an oscillating saw. Size 4is the most appropriate size for the tibia and the proximal tibia is prepared with the modular drill and keel punch for that size.      The femoral sizing guide is placed and size 5 is most appropriate. Rotation is marked off the epicondylar axis and confirmed by creating a rectangular flexion gap at 90 degrees. The size 5 cutting block is pinned in this rotation and the anterior, posterior and chamfer cuts are made with the oscillating saw. The intercondylar block is then placed and that cut is made.  Trial size 4 tibial component, trial size 5 posterior stabilized femur and a 8  mm posterior stabilized rotating platform insert trial is placed. Full extension is achieved with excellent varus/valgus and anterior/posterior balance throughout full range of motion. The patella is everted and thickness measured to be 22  mm. Free hand resection is taken to 12 mm, a 35 template is placed, lug  holes are drilled, trial patella is placed, and it tracks normally. Osteophytes are removed off the posterior femur with the trial in place. All trials are removed and the cut bone surfaces prepared with pulsatile lavage. Cement is mixed and once ready for implantation, the size 4 tibial implant, size  5 posterior stabilized femoral component, and the size 35 patella are cemented in place and the patella is held with the clamp. The trial insert is placed and the knee held in full extension. The Exparel (20 ml mixed with 60 ml saline) is injected into the extensor mechanism, posterior capsule, medial and lateral gutters and subcutaneous tissues.  All extruded cement is removed and once the cement is hard the permanent 8 mm posterior stabilized rotating platform insert is placed into the tibial tray.      The wound is copiously irrigated with saline solution and the extensor mechanism closed over a hemovac drain with #1 V-loc suture. The tourniquet is released for a total tourniquet time of 38  minutes. Flexion against gravity is 140 degrees and the patella tracks normally. Subcutaneous tissue is closed with 2.0 vicryl and subcuticular with running 4.0 Monocryl. The incision is cleaned and dried and steri-strips and a bulky sterile dressing are applied. The limb is placed into a knee immobilizer and the patient is awakened and transported to recovery in stable condition.      Please note that a surgical assistant was a medical necessity for this procedure in order to perform it in a safe and expeditious manner. Surgical assistant was necessary to retract the ligaments and vital neurovascular structures to prevent injury to them and also necessary for proper positioning of the limb to allow for anatomic placement of the prosthesis.   Dione Plover Maira Christon, MD    03/07/2018, 8:25 AM

## 2018-03-07 NOTE — Anesthesia Procedure Notes (Signed)
Anesthesia Regional Block: Adductor canal block   Pre-Anesthetic Checklist: ,, timeout performed, Correct Patient, Correct Site, Correct Laterality, Correct Procedure, Correct Position, site marked, Risks and benefits discussed, pre-op evaluation,  At surgeon's request and post-op pain management  Laterality: Right  Prep: Maximum Sterile Barrier Precautions used, chloraprep       Needles:  Injection technique: Single-shot  Needle Type: Echogenic Stimulator Needle     Needle Length: 9cm  Needle Gauge: 21     Additional Needles:   Procedures:,,,, ultrasound used (permanent image in chart),,,,  Narrative:  Start time: 03/07/2018 6:46 AM End time: 03/07/2018 6:56 AM Injection made incrementally with aspirations every 5 mL.  Performed by: Personally  Anesthesiologist: Roderic Palau, MD  Additional Notes: 2% Lidocaine skin wheel.

## 2018-03-07 NOTE — Transfer of Care (Signed)
Immediate Anesthesia Transfer of Care Note  Patient: [name]  Procedure(s) Performed: RIGHT TOTAL KNEE ARTHROPLASTY (Right Knee)  Patient Location: PACU  Anesthesia Type:Spinal  Level of Consciousness: awake, alert , oriented and patient cooperative  Airway & Oxygen Therapy: Patient Spontanous Breathing and Patient connected to face mask oxygen  Post-op Assessment: Report given to RN and Post -op Vital signs reviewed and stable  Post vital signs: stable  Last Vitals:  Vitals Value Taken Time  BP    Temp    Pulse    Resp    SpO2      Last Pain:  Vitals:   03/07/18 0600  TempSrc: Oral  PainSc:       Patients Stated Pain Goal: 4 (58/59/29 2446)  Complications: No apparent anesthesia complications

## 2018-03-07 NOTE — Anesthesia Postprocedure Evaluation (Signed)
Anesthesia Post Note  Patient: [name]  Procedure(s) Performed: RIGHT TOTAL KNEE ARTHROPLASTY (Right Knee)     Patient location during evaluation: PACU Anesthesia Type: Spinal and Regional Level of consciousness: oriented and awake and alert Pain management: pain level controlled Vital Signs Assessment: post-procedure vital signs reviewed and stable Respiratory status: spontaneous breathing, respiratory function stable and patient connected to nasal cannula oxygen Cardiovascular status: blood pressure returned to baseline and stable Postop Assessment: no headache, no backache, spinal receding and patient able to bend at knees Anesthetic complications: no    Last Vitals:  Vitals:   03/07/18 1024 03/07/18 1042  BP: 119/69 126/63  Pulse: 65   Resp: 16 16  Temp: 36.8 C 36.9 C  SpO2: 100% 100%    Last Pain:  Vitals:   03/07/18 1053  TempSrc:   PainSc: 3                  Bisma Klett,W. EDMOND

## 2018-03-08 ENCOUNTER — Encounter (HOSPITAL_COMMUNITY): Payer: Self-pay | Admitting: Orthopedic Surgery

## 2018-03-08 LAB — CBC
HEMATOCRIT: 29.3 % — AB (ref 36.0–46.0)
HEMOGLOBIN: 9.9 g/dL — AB (ref 12.0–15.0)
MCH: 32 pg (ref 26.0–34.0)
MCHC: 33.8 g/dL (ref 30.0–36.0)
MCV: 94.8 fL (ref 78.0–100.0)
Platelets: 232 10*3/uL (ref 150–400)
RBC: 3.09 MIL/uL — AB (ref 3.87–5.11)
RDW: 12 % (ref 11.5–15.5)
WBC: 12.1 10*3/uL — ABNORMAL HIGH (ref 4.0–10.5)

## 2018-03-08 LAB — GLUCOSE, CAPILLARY
GLUCOSE-CAPILLARY: 258 mg/dL — AB (ref 65–99)
GLUCOSE-CAPILLARY: 202 mg/dL — AB (ref 65–99)
Glucose-Capillary: 151 mg/dL — ABNORMAL HIGH (ref 65–99)
Glucose-Capillary: 146 mg/dL — ABNORMAL HIGH (ref 65–99)
Glucose-Capillary: 204 mg/dL — ABNORMAL HIGH (ref 65–99)

## 2018-03-08 LAB — BASIC METABOLIC PANEL
Anion gap: 8 (ref 5–15)
BUN: 17 mg/dL (ref 6–20)
CHLORIDE: 106 mmol/L (ref 101–111)
CO2: 22 mmol/L (ref 22–32)
CREATININE: 0.83 mg/dL (ref 0.44–1.00)
Calcium: 8.3 mg/dL — ABNORMAL LOW (ref 8.9–10.3)
GFR calc Af Amer: >60 mL/min (ref >60)
GFR calc non Af Amer: >60 mL/min (ref >60)
Glucose, Bld: 124 mg/dL — ABNORMAL HIGH (ref 65–99)
POTASSIUM: 4 mmol/L (ref 3.5–5.1)
Sodium: 136 mmol/L (ref 135–145)

## 2018-03-08 NOTE — Progress Notes (Addendum)
Subjective: 1 Day Post-Op Procedure(s) (LRB): RIGHT TOTAL KNEE ARTHROPLASTY (Right) Patient reports pain as moderate.   Patient seen in rounds by Dr. Wynelle Link. Patient is well, and has had no acute complaints or problems. Voiding without difficulty. Denies chest pain, SOB, calf pain. We will start ambulating with therapy today.    Objective: Vital signs in last 24 hours: Temp:  [97.4 F (36.3 C)-98.4 F (36.9 C)] 98.3 F (36.8 C) (06/04 0540) Pulse Rate:  [57-90] 75 (06/04 0540) Resp:  [11-21] 16 (06/04 0540) BP: (105-130)/(51-74) 130/62 (06/04 0540) SpO2:  [97 %-100 %] 99 % (06/04 0540)  Intake/Output from previous day:  Intake/Output Summary (Last 24 hours) at 03/08/2018 0709 Last data filed at 03/08/2018 0600 Gross per 24 hour  Intake 3975 ml  Output 1975 ml  Net 2000 ml     Labs: Recent Labs    03/08/18 0540  HGB 9.9*   Recent Labs    03/08/18 0540  WBC 12.1*  RBC 3.09*  HCT 29.3*  PLT 232   Recent Labs    03/08/18 0540  NA 136  K 4.0  CL 106  CO2 22  BUN 17  CREATININE 0.83  GLUCOSE 124*  CALCIUM 8.3*   Exam: General - Patient is Alert and Oriented Extremity - Neurologically intact Neurovascular intact Sensation intact distally Dorsiflexion/Plantar flexion intact Dressing - dressing C/D/I Motor Function - intact, moving foot and toes well on exam.   Past Medical History:  Diagnosis Date  . Allergy history unknown   . Arthritis    oa  . Asthma    takes acolate for  . Breast cancer ([place]) 1999; 2008   hx of bilateral breast cancer  . Breast cancer, left breast ([place]) 10/27/2012   3.2 cm ER positive, Her-2 negative, 1 node positive S/P mastectomy/TTRAM reconstruction 4/08  . Breast cancer, right breast ([place]) 10/27/2012   3.2 cm ER/PR positive, 1 node positive, Her-2 negative Rx w mastectomy, AC -T chemo, followed by aromatase inhibitor x 5 years dx 4/08  . Cancer [facility])    breast CA  . Diabetes ([place])   . DM2 (diabetes mellitus, type 2) ([place])     on welchol for  . Dyspnea    with exertion  . Elevated cholesterol   . GERD (gastroesophageal reflux disease)   . Hiatal hernia   . Hypertension   . LBBB (left bundle branch block) 10/27/2012  . Leg cramps   . NICM (nonischemic cardiomyopathy) ([place])    a. Echo:  06/14/12: Poor endocardial definition, possible septal HK, EF 50-55%, normal wall motion, mild LAE ;  b.  [facility] 9/13:  normal cors, EF 35-40%,  c. Echo 7/14: EF 50-55%, mild LAE  . Other fatigue 01/01/2015  . Pulmonary embolus ([place]) 2001   after tram flap surgery    Assessment/Plan: 1 Day Post-Op Procedure(s) (LRB): RIGHT TOTAL KNEE ARTHROPLASTY (Right) Principal Problem:   OA (osteoarthritis) of knee  Estimated body mass index is 32.19 kg/m as calculated from the following:   Height as of this encounter: 5' 2"  (1.575 m).   Weight as of this encounter: 79.8 kg (176 lb). Advance diet Up with therapy  Anticipated LOS equal to or greater than 2 midnights due to - Age 59 and older with one or more of the following:  - Obesity  - Expected need for hospital services (PT, OT, Nursing) required for safe  discharge  - Anticipated need for postoperative skilled nursing care or inpatient rehab  - Active  co-morbidities: Diabetes OR   - Unanticipated findings during/Post Surgery: None  - Patient is a high risk of re-admission due to: None    DVT Prophylaxis - Eliquis Weight bearing as tolerated. D/C O2 and pulse ox and try on room air. Hemovac pulled without difficulty, will begin ambulating with therapy today.  Hemoglobin dropped to 9.9 from 11.6, will continue to monitor closely with daily CBC. Patient nonsymptomatic.    Plan is to go Home with outpatient therapy at [facility] after hospital stay.  Theresa Duty, PA-C Orthopedic Surgery 03/08/2018, 7:09 AM

## 2018-03-08 NOTE — Progress Notes (Signed)
Physical Therapy Treatment Patient Details Name: [name] MRN: [id number] DOB: [birth year] Today's Date: 03/08/2018    History of Present Illness 72 yo female s/p R TKA 03/07/18.     PT Comments    Progressing slowly with mobility. Limited by pain, weakness, and dizziness. Will continue to progress activity as tolerated.    Follow Up Recommendations  Follow surgeon's recommendation for DC plan and follow-up therapies     Equipment Recommendations  Rolling walker with 5" wheels    Recommendations for Other Services       Precautions / Restrictions Precautions Precautions: Fall Required Braces or Orthoses: Knee Immobilizer - Right Knee Immobilizer - Right: Discontinue once straight leg raise with < 10 degree lag Restrictions Weight Bearing Restrictions: No Other Position/Activity Restrictions: WBAT    Mobility  Bed Mobility Overal bed mobility: Needs Assistance Bed Mobility: Supine to Sit     Supine to sit: Min assist     General bed mobility comments: Assist for R LE. Increased time. VCs technique, safety.   Transfers Overall transfer level: Needs assistance Equipment used: Rolling walker (2 wheeled) Transfers: Sit to/from [facility] Sit to Stand: Mod assist Stand pivot transfers: Min assist       General transfer comment: Assist to rise, stabilize, control descent. VCs safety, technique, hand/LE placement. Increased time. Stand pivot, bed to recliner, with RW. Pt c/o dizziness.   Ambulation/Gait Ambulation/Gait assistance: Min assist Ambulation Distance (Feet): 3 Feet Assistive device: Rolling walker (2 wheeled) Gait Pattern/deviations: Step-to pattern;Trunk flexed;Antalgic     General Gait Details: VCs safety, technique, sequence, posture. Assist to stabilize pt. Pt c/o dizziness so deferred further ambulation. BP WNL once seated in recliner   Stairs             Wheelchair Mobility    Modified Rankin (Stroke Patients  Only)       Balance Overall balance assessment: Needs assistance         Standing balance support: Bilateral upper extremity supported Standing balance-Leahy Scale: Poor                              Cognition Arousal/Alertness: Awake/alert Behavior During Therapy: WFL for tasks assessed/performed Overall Cognitive Status: Within Functional Limits for tasks assessed                                        Exercises Total Joint Exercises Ankle Circles/Pumps: AROM;Both;10 reps;Supine Quad Sets: AROM;Both;10 reps;Supine Heel Slides: AAROM;Right;10 reps;Supine Hip ABduction/ADduction: AAROM;Right;10 reps;Supine Straight Leg Raises: AAROM;Right;10 reps;Supine Goniometric ROM: ~10-35 degrees (limited by pain)    General Comments        Pertinent Vitals/Pain Pain Assessment: 0-10 Pain Score: 8  Pain Location: R knee Pain Descriptors / Indicators: Aching;Burning;Sore;Discomfort;Grimacing Pain Intervention(s): Limited activity within patient's tolerance;Repositioned;Ice applied    Home Living                      Prior Function            PT Goals (current goals can now be found in the care plan section) Progress towards PT goals: Progressing toward goals    Frequency    7X/week      PT Plan Current plan remains appropriate    Co-evaluation  AM-PAC PT "6 Clicks" Daily Activity  Outcome Measure  Difficulty turning over in bed (including adjusting bedclothes, sheets and blankets)?: A Lot Difficulty moving from lying on back to sitting on the side of the bed? : Unable Difficulty sitting down on and standing up from a chair with arms (e.g., wheelchair, bedside commode, etc,.)?: Unable Help needed moving to and from a bed to chair (including a wheelchair)?: A Lot Help needed walking in hospital room?: A Lot Help needed climbing 3-5 steps with a railing? : Total 6 Click Score: 9    End of Session Equipment  Utilized During Treatment: Gait belt;Right knee immobilizer Activity Tolerance: Patient limited by fatigue;Patient limited by pain Patient left: in chair;with call bell/phone within reach;with family/visitor present   PT Visit Diagnosis: Difficulty in walking, not elsewhere classified (R26.2);Pain Pain - Right/Left: Right Pain - part of body: Knee     Time: 6433-2951 PT Time Calculation (min) (ACUTE ONLY): 26 min  Charges:  $Gait Training: 8-22 mins $Therapeutic Exercise: 8-22 mins                    G Codes:          Weston Anna, MPT Pager: [phone]

## 2018-03-08 NOTE — Progress Notes (Signed)
Physical Therapy Treatment Patient Details Name: DESTRY BEZDEK MRN: [id number] DOB: 03-Jan-1946 Today's Date: 03/08/2018    History of Present Illness 72 yo female s/p R TKA 03/07/18.     PT Comments    Pt is progressing very slowly with mobility. Limited by lightheadedness, weakness, and pain. She has only been able to ambulate a total of ~8 feet on today. Multiple seated rest breaks just to accomplish getting to [facility] and walking a very short distance. Pt has not yet met any of her PT goals in order to safely d/c home with husband's assistance. Encouraged her to discuss slow progress and pain control with PA/MD. She needs to be able to ambulate a minimum of ~30 feet just to get into her home from the car (this does not include getting to the area of the house where she will settle in). Will continue to follow and progress activity as tolerated.     Follow Up Recommendations  Follow surgeon's recommendation for DC plan and follow-up therapies     Equipment Recommendations  Rolling walker with 5" wheels    Recommendations for Other Services       Precautions / Restrictions Precautions Precautions: Fall Required Braces or Orthoses: Knee Immobilizer - Right Knee Immobilizer - Right: Discontinue once straight leg raise with < 10 degree lag Restrictions Weight Bearing Restrictions: No Other Position/Activity Restrictions: WBAT    Mobility  Bed Mobility Overal bed mobility: Needs Assistance Bed Mobility: Supine to Sit;Sit to Supine     Supine to sit: Min assist Sit to supine: Min assist   General bed mobility comments: Assist for R LE. Increased time. VCs technique, safety.   Transfers Overall transfer level: Needs assistance Equipment used: Rolling walker (2 wheeled) Transfers: Sit to/from Stand Sit to Stand: From elevated surface;Min assist Stand pivot transfers: Min assist       General transfer comment: Assist to rise, stabilize, control descent. VCs safety, technique,  hand/LE placement. Increased time. Stand pivot x 2, bed <>recliner, with RW. Pt c/o some lightheadedness but mostly pain  Ambulation/Gait Ambulation/Gait assistance: Min assist Ambulation Distance (Feet): 8 Feet Assistive device: Rolling walker (2 wheeled) Gait Pattern/deviations: Step-to pattern;Trunk flexed;Antalgic;Decreased stance time - right     General Gait Details: VCs safety, technique, sequence, posture. Assist to stabilize pt and follow with recliner. Pt c/o some lightheadedness, weakness, and pain. She was unable to continue.    Stairs             Wheelchair Mobility    Modified Rankin (Stroke Patients Only)       Balance                                            Cognition Arousal/Alertness: Awake/alert Behavior During Therapy: WFL for tasks assessed/performed Overall Cognitive Status: Within Functional Limits for tasks assessed                                        Exercises      General Comments        Pertinent Vitals/Pain Pain Assessment: 0-10 Pain Score: 8  Pain Location: R knee Pain Descriptors / Indicators: Aching;Burning;Sore;Discomfort;Grimacing Pain Intervention(s): Limited activity within patient's tolerance;Repositioned;Ice applied    Home Living  Prior Function            PT Goals (current goals can now be found in the care plan section) Progress towards PT goals: Progressing toward goals(very slowly)    Frequency    7X/week      PT Plan Current plan remains appropriate    Co-evaluation              AM-PAC PT "6 Clicks" Daily Activity  Outcome Measure  Difficulty turning over in bed (including adjusting bedclothes, sheets and blankets)?: A Lot Difficulty moving from lying on back to sitting on the side of the bed? : Unable Difficulty sitting down on and standing up from a chair with arms (e.g., wheelchair, bedside commode, etc,.)?: Unable Help  needed moving to and from a bed to chair (including a wheelchair)?: A Lot Help needed walking in hospital room?: A Lot Help needed climbing 3-5 steps with a railing? : Total 6 Click Score: 9    End of Session Equipment Utilized During Treatment: Gait belt;Right knee immobilizer Activity Tolerance: Patient limited by fatigue;Patient limited by pain Patient left: in bed;with call bell/phone within reach;with family/visitor present   PT Visit Diagnosis: Difficulty in walking, not elsewhere classified (R26.2);Pain Pain - Right/Left: Right Pain - part of body: Knee     Time: 6016-5800 PT Time Calculation (min) (ACUTE ONLY): 20 min  Charges:  $Gait Training: 8-22 mins                    G Codes:          Weston Anna, MPT Pager: [phone]

## 2018-03-09 LAB — BASIC METABOLIC PANEL
ANION GAP: 8 (ref 5–15)
BUN: 14 mg/dL (ref 6–20)
CO2: 27 mmol/L (ref 22–32)
Calcium: 8.7 mg/dL — ABNORMAL LOW (ref 8.9–10.3)
Chloride: 99 mmol/L — ABNORMAL LOW (ref 101–111)
Creatinine, Ser: 0.77 mg/dL (ref 0.44–1.00)
Glucose, Bld: 150 mg/dL — ABNORMAL HIGH (ref 65–99)
POTASSIUM: 3.7 mmol/L (ref 3.5–5.1)
SODIUM: 134 mmol/L — AB (ref 135–145)

## 2018-03-09 LAB — CBC
HCT: 29.3 % — ABNORMAL LOW (ref 36.0–46.0)
Hemoglobin: 9.9 g/dL — ABNORMAL LOW (ref 12.0–15.0)
MCH: 31.9 pg (ref 26.0–34.0)
MCHC: 33.8 g/dL (ref 30.0–36.0)
MCV: 94.5 fL (ref 78.0–100.0)
PLATELETS: 266 10*3/uL (ref 150–400)
RBC: 3.1 MIL/uL — AB (ref 3.87–5.11)
RDW: 12.3 % (ref 11.5–15.5)
WBC: 13.1 10*3/uL — AB (ref 4.0–10.5)

## 2018-03-09 LAB — GLUCOSE, CAPILLARY
Glucose-Capillary: 130 mg/dL — ABNORMAL HIGH (ref 65–99)
Glucose-Capillary: 141 mg/dL — ABNORMAL HIGH (ref 65–99)
Glucose-Capillary: 153 mg/dL — ABNORMAL HIGH (ref 65–99)
Glucose-Capillary: 159 mg/dL — ABNORMAL HIGH (ref 65–99)

## 2018-03-09 MED ORDER — HYDROMORPHONE HCL 2 MG PO TABS: 2.0000 mg | ORAL_TABLET | ORAL | 0 refills | Status: DC | PRN

## 2018-03-09 MED ORDER — TRAMADOL HCL 50 MG PO TABS: 50.0000 mg | ORAL_TABLET | Freq: Four times a day (QID) | ORAL | 0 refills | Status: DC | PRN

## 2018-03-09 MED ORDER — METHOCARBAMOL 500 MG PO TABS: 500.0000 mg | ORAL_TABLET | Freq: Four times a day (QID) | ORAL | 0 refills | Status: DC | PRN

## 2018-03-09 NOTE — Progress Notes (Signed)
Patient reports experiencing hallucinations, reports that she was speaking to someone not in the room. Patient also reported feeling very drowsy at the time. Patient also c/o severe pain at this time. Discussed options, patient agrees to try APAP and robaxin at this time. Will cont to monitor.

## 2018-03-09 NOTE — Progress Notes (Signed)
Physical Therapy Treatment Patient Details Name: [name] MRN: [id number] DOB: [birth year] Today's Date: 03/09/2018    History of Present Illness 72 yo female s/p R TKA 03/07/18.     PT Comments    Progress has been slow. Pt walked ~15'x2 with a RW during 2nd session. She continues to c/o lightheadedness and pain with activity. Pt reported she feels terrible. She became nauseous and vomited once back in bed. Will continue to progress activity as pt is able to tolerate.     Follow Up Recommendations  Follow surgeon's recommendation for DC plan and follow-up therapies     Equipment Recommendations       Recommendations for Other Services       Precautions / Restrictions Precautions Precautions: Fall Required Braces or Orthoses: Knee Immobilizer - Right Knee Immobilizer - Right: Discontinue once straight leg raise with < 10 degree lag Restrictions Weight Bearing Restrictions: No Other Position/Activity Restrictions: WBAT    Mobility  Bed Mobility Overal bed mobility: Needs Assistance Bed Mobility: Sit to Supine     Supine to sit: Min assist;HOB elevated Sit to supine: Min assist;HOB elevated   General bed mobility comments: Assist for R LE. Increased time. VCs technique, safety. Pt became nauseous and vomited once back in bed.   Transfers Overall transfer level: Needs assistance Equipment used: Rolling walker (2 wheeled) Transfers: Sit to/from Stand Sit to Stand: Min assist;From elevated surface Stand pivot transfers: Min assist;From elevated surface       General transfer comment: Assist to rise, stabilize, control descent. VCs safety, technique, hand/LE placement. Increased time. Stand pivot,bed<>bsc, with RW. Pt c/o lightheadedness and pain  Ambulation/Gait Ambulation/Gait assistance: Min assist Ambulation Distance (Feet): 15 Feet(x2) Assistive device: Rolling walker (2 wheeled) Gait Pattern/deviations: Step-to pattern;Trunk flexed;Antalgic;Decreased stance  time - right     General Gait Details: VCs safety, technique, sequence, posture. Assist to stabilize pt throughout distance.  Pt c/o lightheadedness and pain. 1 seated rest break taken/needed. Husband followed with recliner   Stairs             Wheelchair Mobility    Modified Rankin (Stroke Patients Only)       Balance Overall balance assessment: Needs assistance         Standing balance support: Bilateral upper extremity supported Standing balance-Leahy Scale: Poor                              Cognition Arousal/Alertness: Awake/alert Behavior During Therapy: WFL for tasks assessed/performed Overall Cognitive Status: Within Functional Limits for tasks assessed                                        Exercises    General Comments        Pertinent Vitals/Pain Pain Assessment: 0-10 Pain Score: 8  Pain Location: R knee Pain Descriptors / Indicators: Aching;Burning;Sore;Discomfort;Grimacing Pain Intervention(s): Limited activity within patient's tolerance;Repositioned;Ice applied    Home Living                      Prior Function            PT Goals (current goals can now be found in the care plan section) Progress towards PT goals: Progressing toward goals(slowly)    Frequency    7X/week      PT Plan Current plan  remains appropriate    Co-evaluation              AM-PAC PT "6 Clicks" Daily Activity  Outcome Measure  Difficulty turning over in bed (including adjusting bedclothes, sheets and blankets)?: A Lot Difficulty moving from lying on back to sitting on the side of the bed? : Unable Difficulty sitting down on and standing up from a chair with arms (e.g., wheelchair, bedside commode, etc,.)?: Unable Help needed moving to and from a bed to chair (including a wheelchair)?: A Lot Help needed walking in hospital room?: A Lot Help needed climbing 3-5 steps with a railing? : Total 6 Click Score: 9     End of Session Equipment Utilized During Treatment: Gait belt;Right knee immobilizer Activity Tolerance: Patient limited by fatigue;Patient limited by pain Patient left: in chair;with call bell/phone within reach;with family/visitor present   PT Visit Diagnosis: Difficulty in walking, not elsewhere classified (R26.2);Pain Pain - Right/Left: Right Pain - part of body: Knee     Time: 1410-1425 PT Time Calculation (min) (ACUTE ONLY): 15 min  Charges:  $Gait Training: 8-22 mins $Therapeutic Exercise: 8-22 mins                    G Codes:          Weston Anna, MPT Pager: [phone]

## 2018-03-09 NOTE — Progress Notes (Signed)
Subjective: 2 Days Post-Op Procedure(s) (LRB): RIGHT TOTAL KNEE ARTHROPLASTY (Right) Patient reports pain as moderate.   Patient seen in rounds with Dr. Wynelle Link. Patient is having problems with nausea/vomiting and pain in the right knee, requiring pain medications. She denies SOB and chest pain. She has some lightheadedness with PT yesterday but is feeling better now. She vomited a little clear liquid yesterday. Voiding and reports flatus.  Plan is to go Home after hospital stay.  Objective: Vital signs in last 24 hours: Temp:  [97.6 F (36.4 C)-98.5 F (36.9 C)] 98.3 F (36.8 C) (06/05 0511) Pulse Rate:  [74-83] 79 (06/05 0511) Resp:  [16-18] 18 (06/05 0511) BP: (121-127)/(59-71) 125/71 (06/05 0511) SpO2:  [94 %-98 %] 94 % (06/05 0511)  Intake/Output from previous day:  Intake/Output Summary (Last 24 hours) at 03/09/2018 0727 Last data filed at 03/09/2018 0600 Gross per 24 hour  Intake 1400 ml  Output 350 ml  Net 1050 ml     Labs: Recent Labs    03/08/18 0540 03/09/18 0510  HGB 9.9* 9.9*   Recent Labs    03/08/18 0540 03/09/18 0510  WBC 12.1* 13.1*  RBC 3.09* 3.10*  HCT 29.3* 29.3*  PLT 232 266   Recent Labs    03/08/18 0540 03/09/18 0510  NA 136 134*  K 4.0 3.7  CL 106 99*  CO2 22 27  BUN 17 14  CREATININE 0.83 0.77  GLUCOSE 124* 150*  CALCIUM 8.3* 8.7*    EXAM General - Patient is Alert and Oriented Extremity - Neurologically intact Sensation intact distally Intact pulses distally Dorsiflexion/Plantar flexion intact Compartment soft Dressing/Incision - clean, dry, no drainage Motor Function - intact, moving foot and toes well on exam.   Past Medical History:  Diagnosis Date  . Allergy history unknown   . Arthritis    oa  . Asthma    takes acolate for  . Breast cancer ([place]) 1999; 2008   hx of bilateral breast cancer  . Breast cancer, left breast ([place]) 10/27/2012   3.2 cm ER positive, Her-2 negative, 1 node positive S/P mastectomy/TTRAM  reconstruction 4/08  . Breast cancer, right breast ([place]) 10/27/2012   3.2 cm ER/PR positive, 1 node positive, Her-2 negative Rx w mastectomy, AC -T chemo, followed by aromatase inhibitor x 5 years dx 4/08  . Cancer [facility])    breast CA  . Diabetes ([place])   . DM2 (diabetes mellitus, type 2) ([place])    on welchol for  . Dyspnea    with exertion  . Elevated cholesterol   . GERD (gastroesophageal reflux disease)   . Hiatal hernia   . Hypertension   . LBBB (left bundle branch block) 10/27/2012  . Leg cramps   . NICM (nonischemic cardiomyopathy) ([place])    a. Echo:  06/14/12: Poor endocardial definition, possible septal HK, EF 50-55%, normal wall motion, mild LAE ;  b.  [facility] 9/13:  normal cors, EF 35-40%,  c. Echo 7/14: EF 50-55%, mild LAE  . Other fatigue 01/01/2015  . Pulmonary embolus ([place]) 2001   after tram flap surgery    Assessment/Plan: 2 Days Post-Op Procedure(s) (LRB): RIGHT TOTAL KNEE ARTHROPLASTY (Right) Principal Problem:   OA (osteoarthritis) of knee  Estimated body mass index is 32.19 kg/m as calculated from the following:   Height as of this encounter: _0  (1.575 m).   Weight as of this encounter: 79.8 kg (176 lb). Advance diet Up with therapy D/C IV fluids  DVT Prophylaxis - resume  Eliquis Weight-Bearing as tolerated   Will continue with therapy for two sessions today. Continue to watch labs. Plan for DC home tomorrow pending progress. Limit pain meds as much as possible due to nausea.  Ardeen Jourdain, PA-C Orthopaedic Surgery 03/09/2018, 7:27 AM

## 2018-03-09 NOTE — Progress Notes (Signed)
Physical Therapy Treatment Patient Details Name: [name] MRN: [id number] DOB: August 10, 1946 Today's Date: 03/09/2018    History of Present Illness 72 yo female s/p R TKA 03/07/18.     PT Comments    Progressing slowly with mobility. Pt was able to ambulate ~15'x1, then another ~25'x 1 this session. Mobility remains limited by lightheadedness, pain, and fatigue. Pt reports she feels terrible. Will continue to follow and progress activity as pt is able to tolerate. Pt/husband stated they have acquired a wheelchair for use if needed to get pt safely into home.     Follow Up Recommendations  Follow surgeon's recommendation for DC plan and follow-up therapies     Equipment Recommendations       Recommendations for Other Services       Precautions / Restrictions Precautions Precautions: Fall Required Braces or Orthoses: Knee Immobilizer - Right Knee Immobilizer - Right: Discontinue once straight leg raise with < 10 degree lag Restrictions Weight Bearing Restrictions: No Other Position/Activity Restrictions: WBAT    Mobility  Bed Mobility Overal bed mobility: Needs Assistance Bed Mobility: Supine to Sit     Supine to sit: Min assist;HOB elevated     General bed mobility comments: Assist for R LE. Increased time. VCs technique, safety.   Transfers Overall transfer level: Needs assistance Equipment used: Rolling walker (2 wheeled) Transfers: Sit to/from [facility] Sit to Stand: Min assist;From elevated surface Stand pivot transfers: Min assist       General transfer comment: Assist to rise, stabilize, control descent. VCs safety, technique, hand/LE placement. Increased time. Stand pivot,bed<>bsc, with RW. Pt c/o lightheadedness and pain  Ambulation/Gait Ambulation/Gait assistance: Min assist Ambulation Distance (Feet): 15 Feet(15'x1, 25'x1) Assistive device: Rolling walker (2 wheeled) Gait Pattern/deviations: Step-to pattern;Trunk  flexed;Antalgic;Decreased stance time - right     General Gait Details: VCs safety, technique, sequence, posture. Assist to stabilize pt throughout distance.  Pt c/o lightheadedness and pain. 1 seated rest break taken/needed. Husband followed with recliner   Stairs             Wheelchair Mobility    Modified Rankin (Stroke Patients Only)       Balance                                            Cognition Arousal/Alertness: Awake/alert Behavior During Therapy: WFL for tasks assessed/performed Overall Cognitive Status: Within Functional Limits for tasks assessed                                        Exercises Total Joint Exercises Ankle Circles/Pumps: AROM;Both;10 reps;Supine Quad Sets: AROM;Both;10 reps;Supine Heel Slides: AAROM;Right;Supine;5 reps Hip ABduction/ADduction: AAROM;Right;10 reps;Supine Straight Leg Raises: AAROM;Right;Supine;5 reps Goniometric ROM: ~10-35 degrees (limited by pain)    General Comments        Pertinent Vitals/Pain Pain Assessment: 0-10 Pain Score: 8  Pain Location: R knee Pain Descriptors / Indicators: Aching;Burning;Sore;Discomfort;Grimacing Pain Intervention(s): Limited activity within patient's tolerance;Repositioned;Ice applied    Home Living                      Prior Function            PT Goals (current goals can now be found in the care plan section) Progress towards PT  goals: Progressing toward goals    Frequency    7X/week      PT Plan Current plan remains appropriate    Co-evaluation              AM-PAC PT "6 Clicks" Daily Activity  Outcome Measure  Difficulty turning over in bed (including adjusting bedclothes, sheets and blankets)?: A Lot Difficulty moving from lying on back to sitting on the side of the bed? : Unable Difficulty sitting down on and standing up from a chair with arms (e.g., wheelchair, bedside commode, etc,.)?: Unable Help needed  moving to and from a bed to chair (including a wheelchair)?: A Lot Help needed walking in hospital room?: A Lot Help needed climbing 3-5 steps with a railing? : Total 6 Click Score: 9    End of Session Equipment Utilized During Treatment: Gait belt;Right knee immobilizer Activity Tolerance: Patient limited by fatigue;Patient limited by pain Patient left: in chair;with call bell/phone within reach;with family/visitor present   PT Visit Diagnosis: Difficulty in walking, not elsewhere classified (R26.2);Pain Pain - Right/Left: Right Pain - part of body: Knee     Time: 5038-8828 PT Time Calculation (min) (ACUTE ONLY): 28 min  Charges:  $Gait Training: 8-22 mins $Therapeutic Exercise: 8-22 mins                    G Codes:          [place] Anna, MPT Pager: [phone]

## 2018-03-10 ENCOUNTER — Inpatient Hospital Stay (HOSPITAL_COMMUNITY): Payer: Medicare Other

## 2018-03-10 DIAGNOSIS — M79609 Pain in unspecified limb: Secondary | ICD-10-CM

## 2018-03-10 LAB — CBC
HCT: 30.5 % — ABNORMAL LOW (ref 36.0–46.0)
HEMOGLOBIN: 10.4 g/dL — AB (ref 12.0–15.0)
MCH: 32.7 pg (ref 26.0–34.0)
MCHC: 34.1 g/dL (ref 30.0–36.0)
MCV: 95.9 fL (ref 78.0–100.0)
Platelets: 246 10*3/uL (ref 150–400)
RBC: 3.18 MIL/uL — ABNORMAL LOW (ref 3.87–5.11)
RDW: 12.4 % (ref 11.5–15.5)
WBC: 10.5 10*3/uL (ref 4.0–10.5)

## 2018-03-10 LAB — GLUCOSE, CAPILLARY
GLUCOSE-CAPILLARY: 110 mg/dL — AB (ref 65–99)
GLUCOSE-CAPILLARY: 142 mg/dL — AB (ref 65–99)
Glucose-Capillary: 124 mg/dL — ABNORMAL HIGH (ref 65–99)
Glucose-Capillary: 163 mg/dL — ABNORMAL HIGH (ref 65–99)

## 2018-03-10 MED ORDER — GABAPENTIN 100 MG PO CAPS
200.0000 mg | ORAL_CAPSULE | Freq: Three times a day (TID) | ORAL | Status: DC
  Administered 2018-03-10 (×2): 200 mg via ORAL
  Filled 2018-03-10 (×5): qty 2

## 2018-03-10 MED ORDER — GABAPENTIN 100 MG PO CAPS: 200.0000 mg | ORAL_CAPSULE | Freq: Three times a day (TID) | ORAL | 0 refills | Status: DC

## 2018-03-10 NOTE — Progress Notes (Signed)
PT Cancellation Note  Patient Details Name: [name] MRN: [id number] DOB: September 02, 1946   Cancelled Treatment:    Reason Eval/Treat Not Completed: Medical issues which prohibited therapy. Pt awaiting dopper for R LE. Will hold PT for now and check back later today. Thanks.    [place] Anna, MPT Pager: [phone]

## 2018-03-10 NOTE — Progress Notes (Addendum)
Subjective: 3 Days Post-Op Procedure(s) (LRB): RIGHT TOTAL KNEE ARTHROPLASTY (Right) Patient reports pain as mild.   Patient seen in rounds with Dr. Wynelle Link. Patient is having problems with confusion and hallucinations. She has been quite sensitive to pain medication. No SOB or chest pain. Voiding and reports flatus. Continues to be limited with PT due to pain and faitgue. Complaining of right calf pain. Plan is to go Home after hospital stay.  Objective: Vital signs in last 24 hours: Temp:  [97.7 F (36.5 C)-98 F (36.7 C)] 98 F (36.7 C) (06/06 0529) Pulse Rate:  [71-81] 81 (06/06 0529) Resp:  [16] 16 (06/06 0529) BP: (99-124)/(64-67) 120/64 (06/06 0529) SpO2:  [94 %-97 %] 97 % (06/06 0529)  Intake/Output from previous day:  Intake/Output Summary (Last 24 hours) at 03/10/2018 0745 Last data filed at 03/10/2018 0530 Gross per 24 hour  Intake 680 ml  Output 200 ml  Net 480 ml     Labs: Recent Labs    03/08/18 0540 03/09/18 0510 03/10/18 0514  HGB 9.9* 9.9* 10.4*   Recent Labs    03/09/18 0510 03/10/18 0514  WBC 13.1* 10.5  RBC 3.10* 3.18*  HCT 29.3* 30.5*  PLT 266 246   Recent Labs    03/08/18 0540 03/09/18 0510  NA 136 134*  K 4.0 3.7  CL 106 99*  CO2 22 27  BUN 17 14  CREATININE 0.83 0.77  GLUCOSE 124* 150*  CALCIUM 8.3* 8.7*    EXAM General - Patient is Alert and Oriented Extremity - Neurologically intact Sensation intact distally Intact pulses distally Dorsiflexion/Plantar flexion intact No cellulitis present Compartment soft but extremely tender Dressing/Incision - clean, dry, no drainage Motor Function - intact, moving foot and toes well on exam.   Past Medical History:  Diagnosis Date  . Allergy history unknown   . Arthritis    oa  . Asthma    takes acolate for  . Breast cancer ([place]) 1999; 2008   hx of bilateral breast cancer  . Breast cancer, left breast ([place]) 10/27/2012   3.2 cm ER positive, Her-2 negative, 1 node positive S/P  mastectomy/TTRAM reconstruction 4/08  . Breast cancer, right breast ([place]) 10/27/2012   3.2 cm ER/PR positive, 1 node positive, Her-2 negative Rx w mastectomy, AC -T chemo, followed by aromatase inhibitor x 5 years dx 4/08  . Cancer [facility])    breast CA  . Diabetes ([place])   . DM2 (diabetes mellitus, type 2) ([place])    on welchol for  . Dyspnea    with exertion  . Elevated cholesterol   . GERD (gastroesophageal reflux disease)   . Hiatal hernia   . Hypertension   . LBBB (left bundle branch block) 10/27/2012  . Leg cramps   . NICM (nonischemic cardiomyopathy) ([place])    a. Echo:  06/14/12: Poor endocardial definition, possible septal HK, EF 50-55%, normal wall motion, mild LAE ;  b.  [facility] 9/13:  normal cors, EF 35-40%,  c. Echo 7/14: EF 50-55%, mild LAE  . Other fatigue 01/01/2015  . Pulmonary embolus ([place]) 2001   after tram flap surgery    Assessment/Plan: 3 Days Post-Op Procedure(s) (LRB): RIGHT TOTAL KNEE ARTHROPLASTY (Right) Principal Problem:   OA (osteoarthritis) of knee  Estimated body mass index is 32.19 kg/m as calculated from the following:   Height as of this encounter: 5' 2" (1.575 m).   Weight as of this encounter: 79.8 kg (176 lb). Advance diet Up with therapy Plan for discharge  tomorrow  DVT Prophylaxis - Eliquis Weight-Bearing as tolerated   D/C all narcotics. Add gabapentin 300mg taper to help with pain management in addition to Tylenol. Continue PT in house today. Will check right LE for DVT. Plan for discharge home tomorrow pending progress.   Amber Constable, PA-C Orthopaedic Surgery 03/10/2018, 7:45 AM    

## 2018-03-10 NOTE — Progress Notes (Signed)
Physical Therapy Treatment Patient Details Name: [name] MRN: [id number] DOB: 11/30/1945 Today's Date: 03/10/2018    History of Present Illness 72 yo female s/p R TKA 03/07/18.     PT Comments    2nd session to get pt back to bed and to work on exercises. Will continue to progress activity as tolerated.    Follow Up Recommendations  Follow surgeon's recommendation for DC plan and follow-up therapies (may need ST rehab at [facility])     Equipment Recommendations  Rolling walker with 5" wheels    Recommendations for Other Services       Precautions / Restrictions Precautions Precautions: Fall Required Braces or Orthoses: Knee Immobilizer - Right Knee Immobilizer - Right: Discontinue once straight leg raise with < 10 degree lag Restrictions Weight Bearing Restrictions: No Other Position/Activity Restrictions: WBAT    Mobility  Bed Mobility Overal bed mobility: Needs Assistance Bed Mobility: Sit to Supine     Supine to sit: Min assist;HOB elevated Sit to supine: Min assist;HOB elevated   General bed mobility comments: Assist for R LE. Increased time.   Transfers Overall transfer level: Needs assistance Equipment used: Rolling walker (2 wheeled) Transfers: Sit to/from [facility] Sit to Stand: Min assist Stand pivot transfers: Min assist       General transfer comment: Assist to rise, stabilize, control descent. VCs safety, technique, hand/LE placement. Increased time. Stanf pivot, recliner to bed, with RW. Pt had posterior LOB requiring assistance to prevent fall.   Ambulation/Gait Ambulation/Gait assistance: Min assist Ambulation Distance (Feet): 15 Feet(x3) Assistive device: Rolling walker (2 wheeled) Gait Pattern/deviations: Step-to pattern;Trunk flexed;Antalgic;Decreased stance time - right     General Gait Details: VCs safety, technique, sequence, posture. Assist to stabilize pt throughout distance.  Pt c/o dizziness and pain. 2 seated rest  break taken/needed. Husband followed with recliner   Stairs             Wheelchair Mobility    Modified Rankin (Stroke Patients Only)       Balance Overall balance assessment: Needs assistance         Standing balance support: Bilateral upper extremity supported Standing balance-Leahy Scale: Poor                              Cognition Arousal/Alertness: Awake/alert Behavior During Therapy: WFL for tasks assessed/performed Overall Cognitive Status: Within Functional Limits for tasks assessed                                        Exercises Total Joint Exercises Ankle Circles/Pumps: AROM;Both;Supine;10 reps Quad Sets: AROM;Both;Supine;5 reps Heel Slides: AAROM;Right;Supine;5 reps Hip ABduction/ADduction: AAROM;Right;Supine;5 reps Straight Leg Raises: AAROM;Right;Supine;5 reps Goniometric ROM: ~10-45 degrees    General Comments        Pertinent Vitals/Pain Pain Assessment: 0-10 Pain Score: 8  Pain Location: R knee Pain Descriptors / Indicators: Aching;Burning;Sore;Discomfort;Grimacing;Tender Pain Intervention(s): Limited activity within patient's tolerance;Repositioned;Ice applied    Home Living                      Prior Function            PT Goals (current goals can now be found in the care plan section) Progress towards PT goals: Progressing toward goals    Frequency    7X/week      PT  Plan Current plan remains appropriate    Co-evaluation              AM-PAC PT "6 Clicks" Daily Activity  Outcome Measure  Difficulty turning over in bed (including adjusting bedclothes, sheets and blankets)?: A Lot Difficulty moving from lying on back to sitting on the side of the bed? : Unable Difficulty sitting down on and standing up from a chair with arms (e.g., wheelchair, bedside commode, etc,.)?: Unable Help needed moving to and from a bed to chair (including a wheelchair)?: A Lot Help needed walking in  hospital room?: A Lot Help needed climbing 3-5 steps with a railing? : Total 6 Click Score: 9    End of Session Equipment Utilized During Treatment: Gait belt;Right knee immobilizer Activity Tolerance: Patient limited by fatigue;Patient limited by pain Patient left: in bed;with call bell/phone within reach;with family/visitor present   PT Visit Diagnosis: Difficulty in walking, not elsewhere classified (R26.2);Pain Pain - Right/Left: Right Pain - part of body: Knee     Time: 6629-4765 PT Time Calculation (min) (ACUTE ONLY): 12 min  Charges:  $Gait Training: 8-22 mins $Therapeutic Activity: 8-22 mins                    G Codes:          [place] Anna, MPT Pager: [phone]

## 2018-03-10 NOTE — Progress Notes (Signed)
Physical Therapy Treatment Patient Details Name: [name] MRN: [id number] DOB: 1946-09-26 Today's Date: 03/10/2018    History of Present Illness 72 yo female s/p R TKA 03/07/18.     PT Comments    Pt is not really progressing much. Mobility continues to be limited by pain and dizziness. She walked 15 feet x 3 on today. She agreed to sit up in recliner for a short while before returning to be for CPM. NT assessed BP shortly after end of session-reported low reading (see reading in vitals section).     Follow Up Recommendations  Follow surgeon's recommendation for DC plan and follow-up therapies(may need to consider ST SNF)     Equipment Recommendations  Rolling walker with 5" wheels    Recommendations for Other Services       Precautions / Restrictions Precautions Precautions: Fall Required Braces or Orthoses: Knee Immobilizer - Right Knee Immobilizer - Right: Discontinue once straight leg raise with < 10 degree lag Restrictions Weight Bearing Restrictions: No Other Position/Activity Restrictions: WBAT    Mobility  Bed Mobility Overal bed mobility: Needs Assistance Bed Mobility: Supine to Sit     Supine to sit: Min assist;HOB elevated     General bed mobility comments: Assist for R LE. Increased time.   Transfers Overall transfer level: Needs assistance Equipment used: Rolling walker (2 wheeled) Transfers: Sit to/from Stand Sit to Stand: Min assist;From elevated surface         General transfer comment: Assist to rise, stabilize, control descent. VCs safety, technique, hand/LE placement. Increased time  Ambulation/Gait Ambulation/Gait assistance: Min assist Ambulation Distance (Feet): 15 Feet(x3) Assistive device: Rolling walker (2 wheeled) Gait Pattern/deviations: Step-to pattern;Trunk flexed;Antalgic;Decreased stance time - right     General Gait Details: VCs safety, technique, sequence, posture. Assist to stabilize pt throughout distance.  Pt c/o  dizziness and pain. 2 seated rest break taken/needed. Husband followed with recliner   Stairs             Wheelchair Mobility    Modified Rankin (Stroke Patients Only)       Balance Overall balance assessment: Needs assistance         Standing balance support: Bilateral upper extremity supported Standing balance-Leahy Scale: Poor                              Cognition Arousal/Alertness: Awake/alert Behavior During Therapy: WFL for tasks assessed/performed Overall Cognitive Status: Within Functional Limits for tasks assessed                                        Exercises      General Comments        Pertinent Vitals/Pain Pain Assessment: 0-10 Pain Score: 8  Pain Location: R knee Pain Descriptors / Indicators: Aching;Burning;Sore;Discomfort;Grimacing;Tender Pain Intervention(s): Monitored during session;Limited activity within patient's tolerance;Repositioned;Ice applied    Home Living                      Prior Function            PT Goals (current goals can now be found in the care plan section) Progress towards PT goals: Progressing toward goals(slowly)    Frequency    7X/week      PT Plan Current plan remains appropriate    Co-evaluation  AM-PAC PT "6 Clicks" Daily Activity  Outcome Measure  Difficulty turning over in bed (including adjusting bedclothes, sheets and blankets)?: A Lot Difficulty moving from lying on back to sitting on the side of the bed? : Unable Difficulty sitting down on and standing up from a chair with arms (e.g., wheelchair, bedside commode, etc,.)?: Unable Help needed moving to and from a bed to chair (including a wheelchair)?: A Lot Help needed walking in hospital room?: A Lot Help needed climbing 3-5 steps with a railing? : Total 6 Click Score: 9    End of Session Equipment Utilized During Treatment: Gait belt;Right knee immobilizer Activity Tolerance:  Patient limited by fatigue;Patient limited by pain Patient left: in chair;with call bell/phone within reach;with family/visitor present   PT Visit Diagnosis: Difficulty in walking, not elsewhere classified (R26.2);Pain Pain - Right/Left: Right Pain - part of body: Knee     Time: 2440-1027 PT Time Calculation (min) (ACUTE ONLY): 11 min  Charges:  $Gait Training: 8-22 mins                    G Codes:          [place] Anna, MPT Pager: [phone]

## 2018-03-10 NOTE — Plan of Care (Signed)
Reviewed plan of care, specifically pain control options, safety precautions, IS use, and importance of notifying staff with any questions or concerns. Pt and spouse attentive and verbalized understanding of all education.

## 2018-03-10 NOTE — Progress Notes (Signed)
Preliminary notes--Right lower extremity venous duplex exam completed.  Very limited approach due to patient was extremely sensitive and irritated for compression or transducer gentle touch. Negative for DVT where the exam could be performed .   1, Dilatation at the segament of femoral vein mid portion with poor flow detected.  2, A cystic structure seen at the upper calf medial portion, 1.28x0.83x2.23cm in size.   [name] (RDMS RVT) 03/10/18 10:52 AM

## 2018-03-11 LAB — GLUCOSE, CAPILLARY
GLUCOSE-CAPILLARY: 117 mg/dL — AB (ref 65–99)
GLUCOSE-CAPILLARY: 154 mg/dL — AB (ref 65–99)
Glucose-Capillary: 140 mg/dL — ABNORMAL HIGH (ref 65–99)
Glucose-Capillary: 157 mg/dL — ABNORMAL HIGH (ref 65–99)

## 2018-03-11 MED ORDER — ACETAMINOPHEN 325 MG PO TABS: 325.0000 mg | ORAL_TABLET | Freq: Four times a day (QID) | ORAL | 0 refills | Status: DC | PRN

## 2018-03-11 MED ORDER — POLYETHYLENE GLYCOL 3350 17 G PO PACK: 17.0000 g | PACK | Freq: Every day | ORAL | 0 refills | Status: DC | PRN

## 2018-03-11 MED ORDER — DOCUSATE SODIUM 100 MG PO CAPS: 100.0000 mg | ORAL_CAPSULE | Freq: Two times a day (BID) | ORAL | 0 refills | Status: DC

## 2018-03-11 MED ORDER — BISACODYL 10 MG RE SUPP: 10.0000 mg | Freq: Every day | RECTAL | 0 refills | Status: DC | PRN

## 2018-03-11 MED ORDER — GABAPENTIN 100 MG PO CAPS: 200.0000 mg | ORAL_CAPSULE | Freq: Three times a day (TID) | ORAL | 0 refills | Status: DC

## 2018-03-11 MED ORDER — ONDANSETRON HCL 4 MG PO TABS: 4.0000 mg | ORAL_TABLET | Freq: Four times a day (QID) | ORAL | 0 refills | Status: DC | PRN

## 2018-03-11 MED ORDER — TRAMADOL HCL 50 MG PO TABS: 50.0000 mg | ORAL_TABLET | Freq: Four times a day (QID) | ORAL | 0 refills | Status: DC | PRN

## 2018-03-11 MED ORDER — ACETAMINOPHEN 325 MG PO TABS
325.0000 mg | ORAL_TABLET | Freq: Four times a day (QID) | ORAL | Status: DC | PRN
  Administered 2018-03-11 – 2018-03-12 (×5): 650 mg via ORAL
  Filled 2018-03-11 (×5): qty 2

## 2018-03-11 MED ORDER — METHOCARBAMOL 500 MG PO TABS: 500.0000 mg | ORAL_TABLET | Freq: Four times a day (QID) | ORAL | 0 refills | Status: DC | PRN

## 2018-03-11 MED ORDER — METOCLOPRAMIDE HCL 5 MG PO TABS: 5.0000 mg | ORAL_TABLET | Freq: Three times a day (TID) | ORAL | 0 refills | Status: DC | PRN

## 2018-03-11 MED ORDER — FLEET ENEMA 7-19 GM/118ML RE ENEM: 1.0000 | ENEMA | Freq: Once | RECTAL | 0 refills | Status: DC | PRN

## 2018-03-11 NOTE — Progress Notes (Signed)
Home Health arranged from surgeon's office with [facility]. Jonnie Finner RN CCM Case Mgmt phone [phone]

## 2018-03-11 NOTE — Progress Notes (Addendum)
Subjective: 4 Days Post-Op Procedure(s) (LRB): RIGHT TOTAL KNEE ARTHROPLASTY (Right) Patient reports pain as moderate.   Patient seen in rounds with Dr. Wynelle Link. Patient states she is feeling better this AM, confusion and hallucinations have subsided with discontinuation of narcotics. Reports pain in the right knee with walking, but states this has been improving with each day.  Voiding without difficulty and positive flatus. Nausea has also improved.  Doppler performed yesterday to r/o right DVT was negative. The right calf pain has improved as well since yesterday.  Objective: Vital signs in last 24 hours: Temp:  [97.3 F (36.3 C)-98.5 F (36.9 C)] 98.5 F (36.9 C) (06/07 0556) Pulse Rate:  [73-89] 89 (06/07 0556) Resp:  [16-17] 17 (06/07 0556) BP: (84-131)/(58-64) 125/64 (06/07 0556) SpO2:  [94 %-96 %] 94 % (06/07 0556)  Intake/Output from previous day:  Intake/Output Summary (Last 24 hours) at 03/11/2018 0721 Last data filed at 03/11/2018 0530 Gross per 24 hour  Intake 620 ml  Output 0 ml  Net 620 ml    Labs: Recent Labs    03/09/18 0510 03/10/18 0514  HGB 9.9* 10.4*   Recent Labs    03/09/18 0510 03/10/18 0514  WBC 13.1* 10.5  RBC 3.10* 3.18*  HCT 29.3* 30.5*  PLT 266 246   Recent Labs    03/09/18 0510  NA 134*  K 3.7  CL 99*  CO2 27  BUN 14  CREATININE 0.77  GLUCOSE 150*  CALCIUM 8.7*   EXAM General - Patient is Alert and Oriented Extremity - Neurologically intact Neurovascular intact Sensation intact distally Dorsiflexion/Plantar flexion intact  Right calf compartment soft with minimal swelling. Dressing/Incision - clean, dry, no drainage Motor Function - intact, moving foot and toes well on exam.  Past Medical History:  Diagnosis Date  . Allergy history unknown   . Arthritis    oa  . Asthma    takes acolate for  . Breast cancer ([place]) 1999; 2008   hx of bilateral breast cancer  . Breast cancer, left breast ([place]) 10/27/2012   3.2 cm ER  positive, Her-2 negative, 1 node positive S/P mastectomy/TTRAM reconstruction 4/08  . Breast cancer, right breast ([place]) 10/27/2012   3.2 cm ER/PR positive, 1 node positive, Her-2 negative Rx w mastectomy, AC -T chemo, followed by aromatase inhibitor x 5 years dx 4/08  . Cancer [facility])    breast CA  . Diabetes ([place])   . DM2 (diabetes mellitus, type 2) ([place])    on welchol for  . Dyspnea    with exertion  . Elevated cholesterol   . GERD (gastroesophageal reflux disease)   . Hiatal hernia   . Hypertension   . LBBB (left bundle branch block) 10/27/2012  . Leg cramps   . NICM (nonischemic cardiomyopathy) ([place])    a. Echo:  06/14/12: Poor endocardial definition, possible septal HK, EF 50-55%, normal wall motion, mild LAE ;  b.  [facility] 9/13:  normal cors, EF 35-40%,  c. Echo 7/14: EF 50-55%, mild LAE  . Other fatigue 01/01/2015  . Pulmonary embolus ([place]) 2001   after tram flap surgery   Assessment/Plan: 4 Days Post-Op Procedure(s) (LRB): RIGHT TOTAL KNEE ARTHROPLASTY (Right) Principal Problem:   OA (osteoarthritis) of knee  Estimated body mass index is 32.19 kg/m as calculated from the following:   Height as of this encounter: 5' 2"  (1.575 m).   Weight as of this encounter: 79.8 kg (176 lb). Up with therapy  DVT Prophylaxis - Eliquis Weight-Bearing as  tolerated  Discussed disposition with patient this AM. Has not been progressing with therapy as hoped, however patient states she is feeling better this AM since discontinuing narcotics. Will see how she does with PT today, if doing well and meeting goals will plan for potential discharge home later this afternoon with HHPT. If not meeting goals, will need to plan for discharge to SNF for extended assistance with therapy.   Theresa Duty, PA-C Orthopaedic Surgery 03/11/2018, 7:21 AM

## 2018-03-11 NOTE — Progress Notes (Signed)
Per patient and husband, they now want to be discharge to home tomorrow. [name], [place] aware and discontinued discharge order.

## 2018-03-11 NOTE — Discharge Summary (Addendum)
Physician Discharge Summary   Patient ID: [name] MRN: [id number] DOB/AGE: [birth year] 72 y.o.  Admit date: 03/07/2018 Discharge date: 03/12/2018  Primary Diagnosis: Osteoarthritis right knee  Admission Diagnoses:  Past Medical History:  Diagnosis Date  . Allergy history unknown   . Arthritis    oa  . Asthma    takes acolate for  . Breast cancer ([place]) 1999; 2008   hx of bilateral breast cancer  . Breast cancer, left breast ([place]) 10/27/2012   3.2 cm ER positive, Her-2 negative, 1 node positive S/P mastectomy/TTRAM reconstruction 4/08  . Breast cancer, right breast ([place]) 10/27/2012   3.2 cm ER/PR positive, 1 node positive, Her-2 negative Rx w mastectomy, AC -T chemo, followed by aromatase inhibitor x 5 years dx 4/08  . Cancer [facility])    breast CA  . Diabetes ([place])   . DM2 (diabetes mellitus, type 2) ([place])    on welchol for  . Dyspnea    with exertion  . Elevated cholesterol   . GERD (gastroesophageal reflux disease)   . Hiatal hernia   . Hypertension   . LBBB (left bundle branch block) 10/27/2012  . Leg cramps   . NICM (nonischemic cardiomyopathy) ([place])    a. Echo:  06/14/12: Poor endocardial definition, possible septal HK, EF 50-55%, normal wall motion, mild LAE ;  b.  [facility] 9/13:  normal cors, EF 35-40%,  c. Echo 7/14: EF 50-55%, mild LAE  . Other fatigue 01/01/2015  . Pulmonary embolus ([place]) 2001   after tram flap surgery   Discharge Diagnoses:   Principal Problem:   OA (osteoarthritis) of knee  Estimated body mass index is 32.19 kg/m as calculated from the following:   Height as of this encounter: 5' 2"  (1.575 m).   Weight as of this encounter: 79.8 kg (176 lb).  Procedure:  Procedure(s) (LRB): RIGHT TOTAL KNEE ARTHROPLASTY (Right)   Consults: None  HPI: [name] is a 72 y.o. year old female with end stage OA of her right knee with progressively worsening pain and dysfunction. She has constant pain, with activity and at rest and significant functional  deficits with difficulties even with ADLs. She has had extensive non-op management including analgesics, injections of cortisone and viscosupplements, and home exercise program, but remains in significant pain with significant dysfunction.Radiographs show bone on bone arthritis medial and patellofemoral. She presents now for right Total Knee Arthroplasty.   Laboratory Data: Admission on 03/07/2018  Component Date Value Ref Range Status  . Glucose-Capillary 03/07/2018 109* 65 - 99 mg/dL Final  . Comment 1 03/07/2018 Notify RN   Final  . Comment 2 03/07/2018 Document in Chart   Final  . Glucose-Capillary 03/07/2018 124* 65 - 99 mg/dL Final  . Glucose-Capillary 03/07/2018 149* 65 - 99 mg/dL Final  . WBC 03/08/2018 12.1* 4.0 - 10.5 K/uL Final  . RBC 03/08/2018 3.09* 3.87 - 5.11 MIL/uL Final  . Hemoglobin 03/08/2018 9.9* 12.0 - 15.0 g/dL Final  . HCT 03/08/2018 29.3* 36.0 - 46.0 % Final  . MCV 03/08/2018 94.8  78.0 - 100.0 fL Final  . MCH 03/08/2018 32.0  26.0 - 34.0 pg Final  . MCHC 03/08/2018 33.8  30.0 - 36.0 g/dL Final  . RDW 03/08/2018 12.0  11.5 - 15.5 % Final  . Platelets 03/08/2018 232  150 - 400 K/uL Final   Performed at [facility], [place] [address], [place], [place] [address]  . Sodium 03/08/2018 136  135 - 145 mmol/L Final  .  Potassium 03/08/2018 4.0  3.5 - 5.1 mmol/L Final  . Chloride 03/08/2018 106  101 - 111 mmol/L Final  . CO2 03/08/2018 22  22 - 32 mmol/L Final  . Glucose, Bld 03/08/2018 124* 65 - 99 mg/dL Final  . BUN 03/08/2018 17  6 - 20 mg/dL Final  . Creatinine, Ser 03/08/2018 0.83  0.44 - 1.00 mg/dL Final  . Calcium 03/08/2018 8.3* 8.9 - 10.3 mg/dL Final  . GFR calc non Af Amer 03/08/2018 >60  >60 mL/min Final  . GFR calc Af Amer 03/08/2018 >60  >60 mL/min Final   Comment: (NOTE) The eGFR has been calculated using the CKD EPI equation. This calculation has not been validated in all clinical situations. eGFR's persistently <60 mL/min signify  possible Chronic Kidney Disease.   Georgiann Hahn gap 03/08/2018 8  5 - 15 Final   Performed at [facility], [place] [address] [place], [place] [address]  . Glucose-Capillary 03/07/2018 188* 65 - 99 mg/dL Final  . Glucose-Capillary 03/07/2018 272* 65 - 99 mg/dL Final  . Glucose-Capillary 03/08/2018 151* 65 - 99 mg/dL Final  . Glucose-Capillary 03/08/2018 146* 65 - 99 mg/dL Final  . Glucose-Capillary 03/08/2018 204* 65 - 99 mg/dL Final  . WBC 03/09/2018 13.1* 4.0 - 10.5 K/uL Final  . RBC 03/09/2018 3.10* 3.87 - 5.11 MIL/uL Final  . Hemoglobin 03/09/2018 9.9* 12.0 - 15.0 g/dL Final  . HCT 03/09/2018 29.3* 36.0 - 46.0 % Final  . MCV 03/09/2018 94.5  78.0 - 100.0 fL Final  . MCH 03/09/2018 31.9  26.0 - 34.0 pg Final  . MCHC 03/09/2018 33.8  30.0 - 36.0 g/dL Final  . RDW 03/09/2018 12.3  11.5 - 15.5 % Final  . Platelets 03/09/2018 266  150 - 400 K/uL Final   Performed at [facility], [place] [address] [place], [place] [address]  . Sodium 03/09/2018 134* 135 - 145 mmol/L Final  . Potassium 03/09/2018 3.7  3.5 - 5.1 mmol/L Final  . Chloride 03/09/2018 99* 101 - 111 mmol/L Final  . CO2 03/09/2018 27  22 - 32 mmol/L Final  . Glucose, Bld 03/09/2018 150* 65 - 99 mg/dL Final  . BUN 03/09/2018 14  6 - 20 mg/dL Final  . Creatinine, Ser 03/09/2018 0.77  0.44 - 1.00 mg/dL Final  . Calcium 03/09/2018 8.7* 8.9 - 10.3 mg/dL Final  . GFR calc non Af Amer 03/09/2018 >60  >60 mL/min Final  . GFR calc Af Amer 03/09/2018 >60  >60 mL/min Final   Comment: (NOTE) The eGFR has been calculated using the CKD EPI equation. This calculation has not been validated in all clinical situations. eGFR's persistently <60 mL/min signify possible Chronic Kidney Disease.   Georgiann Hahn gap 03/09/2018 8  5 - 15 Final   Performed at [facility], [place] [address], [place],  [address]  . Glucose-Capillary 03/08/2018 258* 65 - 99 mg/dL Final  . Glucose-Capillary 03/08/2018  202* 65 - 99 mg/dL Final  . Glucose-Capillary 03/09/2018 130* 65 - 99 mg/dL Final  . Glucose-Capillary 03/09/2018 141* 65 - 99 mg/dL Final  . WBC 03/10/2018 10.5  4.0 - 10.5 K/uL Final  . RBC 03/10/2018 3.18* 3.87 - 5.11 MIL/uL Final  . Hemoglobin 03/10/2018 10.4* 12.0 - 15.0 g/dL Final  . HCT 03/10/2018 30.5* 36.0 - 46.0 % Final  . MCV 03/10/2018 95.9  78.0 - 100.0 fL Final  . MCH 03/10/2018 32.7  26.0 - 34.0 pg Final  . MCHC 03/10/2018  34.1  30.0 - 36.0 g/dL Final  . RDW 03/10/2018 12.4  11.5 - 15.5 % Final  . Platelets 03/10/2018 246  150 - 400 K/uL Final   Performed at [facility], [place] [address] [place], [place] [address]  . Glucose-Capillary 03/09/2018 153* 65 - 99 mg/dL Final  . Glucose-Capillary 03/09/2018 159* 65 - 99 mg/dL Final  . Glucose-Capillary 03/10/2018 110* 65 - 99 mg/dL Final  . Glucose-Capillary 03/10/2018 124* 65 - 99 mg/dL Final  . Glucose-Capillary 03/10/2018 163* 65 - 99 mg/dL Final  . Glucose-Capillary 03/10/2018 142* 65 - 99 mg/dL Final  . Glucose-Capillary 03/11/2018 117* 65 - 99 mg/dL Final  . Glucose-Capillary 03/11/2018 140* 65 - 99 mg/dL Final  Hospital Outpatient Visit on 03/02/2018  Component Date Value Ref Range Status  . Glucose-Capillary 03/02/2018 124* 65 - 99 mg/dL Final  . aPTT 03/02/2018 28  24 - 36 seconds Final   Performed at [facility], [place] [address], [place], [place] [address]  . WBC 03/02/2018 8.1  4.0 - 10.5 K/uL Final  . RBC 03/02/2018 3.62* 3.87 - 5.11 MIL/uL Final  . Hemoglobin 03/02/2018 11.6* 12.0 - 15.0 g/dL Final  . HCT 03/02/2018 35.0* 36.0 - 46.0 % Final  . MCV 03/02/2018 96.7  78.0 - 100.0 fL Final  . MCH 03/02/2018 32.0  26.0 - 34.0 pg Final  . MCHC 03/02/2018 33.1  30.0 - 36.0 g/dL Final  . RDW 03/02/2018 12.3  11.5 - 15.5 % Final  . Platelets 03/02/2018 294  150 - 400 K/uL Final   Performed at [facility], [place] [address] [place], [place] [address]  . Sodium  03/02/2018 137  135 - 145 mmol/L Final  . Potassium 03/02/2018 4.3  3.5 - 5.1 mmol/L Final  . Chloride 03/02/2018 106  101 - 111 mmol/L Final  . CO2 03/02/2018 23  22 - 32 mmol/L Final  . Glucose, Bld 03/02/2018 104* 65 - 99 mg/dL Final  . BUN 03/02/2018 24* 6 - 20 mg/dL Final  . Creatinine, Ser 03/02/2018 1.06* 0.44 - 1.00 mg/dL Final  . Calcium 03/02/2018 8.9  8.9 - 10.3 mg/dL Final  . Total Protein 03/02/2018 6.8  6.5 - 8.1 g/dL Final  . Albumin 03/02/2018 3.9  3.5 - 5.0 g/dL Final  . AST 03/02/2018 16  15 - 41 U/L Final  . ALT 03/02/2018 18  14 - 54 U/L Final  . Alkaline Phosphatase 03/02/2018 57  38 - 126 U/L Final  . Total Bilirubin 03/02/2018 0.5  0.3 - 1.2 mg/dL Final  . GFR calc non Af Amer 03/02/2018 51* >60 mL/min Final  . GFR calc Af Amer 03/02/2018 59* >60 mL/min Final   Comment: (NOTE) The eGFR has been calculated using the CKD EPI equation. This calculation has not been validated in all clinical situations. eGFR's persistently <60 mL/min signify possible Chronic Kidney Disease.   Georgiann Hahn gap 03/02/2018 8  5 - 15 Final   Performed at [facility], [place] [address], [place], [place] [address]  . Prothrombin Time 03/02/2018 15.0  11.4 - 15.2 seconds Final  . INR 03/02/2018 1.19   Final   Performed at [facility], [place] [address], [place], [place] [address]  . ABO/RH(D) 03/02/2018 A POS   Final  . Antibody Screen 03/02/2018 NEG   Final  . Sample Expiration 03/02/2018 03/10/2018   Final  . Extend sample reason 03/02/2018    Final  Value:NO TRANSFUSIONS OR PREGNANCY IN THE PAST 3 MONTHS Performed at [place] [address] [place], [place] [address]   . MRSA, PCR 03/02/2018 NEGATIVE  NEGATIVE Final  . Staphylococcus aureus 03/02/2018 NEGATIVE  NEGATIVE Final   Comment: (NOTE) The Xpert SA Assay (FDA approved for NASAL specimens in patients 30 years of age and older), is one component of a  comprehensive surveillance program. It is not intended to diagnose infection nor to guide or monitor treatment. Performed at [facility], [place] [address], [place], [place] [address]   . ABO/RH(D) 03/02/2018    Final                   Value:A POS Performed at [facility], [place] [address] [place], [place] [address]   Office Visit on 02/01/2018  Component Date Value Ref Range Status  . Cholesterol 02/01/2018 118  <200 mg/dL Final  . HDL 02/01/2018 48* >50 mg/dL Final  . Triglycerides 02/01/2018 84  <150 mg/dL Final  . LDL Cholesterol (Calc) 02/01/2018 54  mg/dL (calc) Final   Comment: Reference range: <100 . Desirable range <100 mg/dL for primary prevention;   <70 mg/dL for patients with CHD or diabetic patients  with > or = 2 CHD risk factors. Marland Kitchen LDL-C is now calculated using the Martin-Hopkins  calculation, which is a validated novel method providing  better accuracy than the Friedewald equation in the  estimation of LDL-C.  Cresenciano Genre et al. Annamaria Helling. 9622;297(98): 2061-2068  (http://education.[facility].com/faq/FAQ164)   . Total CHOL/HDL Ratio 02/01/2018 2.5  <5.0 (calc) Final  . Non-HDL Cholesterol (Calc) 02/01/2018 70  <130 mg/dL (calc) Final   Comment: For patients with diabetes plus 1 major ASCVD risk  factor, treating to a non-HDL-C goal of <100 mg/dL  (LDL-C of <70 mg/dL) is considered a therapeutic  option.   Jacquelyne Balint, Ur 02/01/2018 0.5  mg/dL Final   Comment: Reference Range Not established   . RAM 02/01/2018    Final   Comment: . The ADA defines abnormalities in albumin excretion as follows: Marland Kitchen Category         Result (mcg/mg creatinine) . Normal                    <30 Microalbuminuria         30-299  Clinical albuminuria   > OR = 300 . The ADA recommends that at least two of three specimens collected within a 3-6 month period be abnormal before considering a patient to be within a diagnostic category.   . Hgb  A1c MFr Bld 02/01/2018 6.7* <5.7 % of total Hgb Final   Comment: For someone without known diabetes, a hemoglobin A1c value of 6.5% or greater indicates that they may have  diabetes and this should be confirmed with a follow-up  test. . For someone with known diabetes, a value <7% indicates  that their diabetes is well controlled and a value  greater than or equal to 7% indicates suboptimal  control. A1c targets should be individualized based on  duration of diabetes, age, comorbid conditions, and  other considerations. . Currently, no consensus exists regarding use of hemoglobin A1c for diagnosis of diabetes for children. .   . Mean Plasma Glucose 02/01/2018 146  (calc) Final  . eAG (mmol/L) 02/01/2018 8.1  (calc) Final  . Hepatitis C Ab 02/01/2018 NON-REACTIVE  NON-REACTI Final  . SIGNAL TO CUT-OFF 02/01/2018 0.02  <1.00 Final   Comment: .  HCV antibody was non-reactive. There is no laboratory  evidence of HCV infection. . In most cases, no further action is required. However, if recent HCV exposure is suspected, a test for HCV RNA (test code [id number]) is suggested. . For additional information please refer to http://education.questdiagnostics.com/faq/FAQ22v1 (This link is being provided for informational/ educational purposes only.) .   Hospital Outpatient Visit on 01/27/2018  Component Date Value Ref Range Status  . WBC 01/27/2018 6.8  4.0 - 10.5 K/uL Final  . RBC 01/27/2018 3.65* 3.87 - 5.11 MIL/uL Final  . Hemoglobin 01/27/2018 11.8* 12.0 - 15.0 g/dL Final  . HCT 01/27/2018 35.1* 36.0 - 46.0 % Final  . MCV 01/27/2018 96.2  78.0 - 100.0 fL Final  . MCH 01/27/2018 32.3  26.0 - 34.0 pg Final  . MCHC 01/27/2018 33.6  30.0 - 36.0 g/dL Final  . RDW 01/27/2018 12.9  11.5 - 15.5 % Final  . Platelets 01/27/2018 277  150 - 400 K/uL Final   Performed at [facility], [place] [address] [place], [place] [address]  . Vitamin B-12 01/27/2018 1,180* 180 - 914 pg/mL Final    Comment: (NOTE) This assay is not validated for testing neonatal or myeloproliferative syndrome specimens for Vitamin B12 levels. Performed at [facility], [place] [address] [place], [place] [address]   . Folate 01/27/2018 56.4  >5.9 ng/mL Final   Comment: RESULTS CONFIRMED BY MANUAL DILUTION Performed at [facility], [place] [address] [place], [place] [address]   . Iron 01/27/2018 97  28 - 170 ug/dL Final  . TIBC 01/27/2018 364  250 - 450 ug/dL Final  . Saturation Ratios 01/27/2018 27  10.4 - 31.8 % Final  . UIBC 01/27/2018 267  ug/dL Final   Performed at [facility], [place] [address] [place], [place] [address]  . Ferritin 01/27/2018 69  11 - 307 ng/mL Final   Performed at [place] [address], [place], [place] [address]  . Sodium 01/27/2018 135  135 - 145 mmol/L Final  . Potassium 01/27/2018 4.1  3.5 - 5.1 mmol/L Final  . Chloride 01/27/2018 104  101 - 111 mmol/L Final  . CO2 01/27/2018 19* 22 - 32 mmol/L Final  . Glucose, Bld 01/27/2018 124* 65 - 99 mg/dL Final  . BUN 01/27/2018 28* 6 - 20 mg/dL Final  . Creatinine, Ser 01/27/2018 1.04* 0.44 - 1.00 mg/dL Final  . Calcium 01/27/2018 9.0  8.9 - 10.3 mg/dL Final  . GFR calc non Af Amer 01/27/2018 52* >60 mL/min Final  . GFR calc Af Amer 01/27/2018 >60  >60 mL/min Final   Comment: (NOTE) The eGFR has been calculated using the CKD EPI equation. This calculation has not been validated in all clinical situations. eGFR's persistently <60 mL/min signify possible Chronic Kidney Disease.   Georgiann Hahn gap 01/27/2018 12  5 - 15 Final   Performed at [facility], [place] [address], [place], [place] [address]  . B Natriuretic Peptide 01/27/2018 35.5  0.0 - 100.0 pg/mL Final   Performed at [facility], [place] [address] [place], [place] [address]  . Uric Acid, Serum 01/27/2018 4.2  2.3 - 6.6 mg/dL Final   Performed at [place] [address], [place],  [address]     X-Rays:No results  found.  EKG: Orders placed or performed during the hospital encounter of 01/27/18  . EKG 12-Lead  . EKG 12-Lead  Hospital Course: ANGELIZ SETTLEMYRE is a 72 y.o. who was admitted to [facility] They were brought to the operating room on 03/07/2018 and underwent Procedure(s): RIGHT TOTAL KNEE ARTHROPLASTY.  Patient tolerated the procedure well and was later transferred to the recovery room and then to the orthopaedic floor for postoperative care.  They were given PO and IV analgesics for pain control following their surgery.  They were given 24 hours of postoperative antibiotics of  Anti-infectives (From admission, onward)   Start     Dose/Rate Route Frequency Ordered Stop   03/07/18 1330  ceFAZolin (ANCEF) IVPB 2g/100 mL premix     2 g 200 mL/hr over 30 Minutes Intravenous Every 6 hours 03/07/18 1037 03/07/18 2053   03/07/18 0600  ceFAZolin (ANCEF) IVPB 2g/100 mL premix     2 g 200 mL/hr over 30 Minutes Intravenous On call to O.R. 03/07/18 [id number] 03/07/18 0739     and started on DVT prophylaxis in the form of Eliquis.   PT and OT were ordered for total joint protocol.  Discharge planning consulted to help with postop disposition and equipment needs.  Patient had a fair night on the evening of surgery.  They started to get up OOB with therapy on POD #1. Hemovac drain was pulled without difficulty.  Continued to work with therapy into POD #2.  Dressing was changed on day two and the incision was clean, dry and intact.  Patient was very sensitive to narcotic pain medication, and experienced nausea/vomiting. Dressing was changed and incision was healing well. On POD #3, patient was having problems with confusion and hallucinations, prompting discontinuation of all narcotics. Pt continued to work with therapy into POD #3 and #4, but was not progressing well or meeting goals. Upon discussion with the patient, it was decided that it would not be safe to d/c to home, and that she would require  discharge to SNF for 24 hour assistance and supervision. However, the patient turned the corner working with therapy on POD #5 and decided that she wanted to go home with HHPT.   Diet: Diabetic diet Activity:WBAT Follow-up:in 2 weeks Disposition - Home with HHPT Discharged Condition: stable   Discharge Instructions    Call MD / Call 911   Complete by:  As directed    If you experience chest pain or shortness of breath, CALL 911 and be transported to the hospital emergency room.  If you develope a fever above 101 F, pus (white drainage) or increased drainage or redness at the wound, or calf pain, call your surgeon's office.   Change dressing   Complete by:  As directed    Change dressing on Wednesday (06/05), then change the dressing daily with sterile 4 x 4 inch gauze dressing and apply TED hose.  You may clean the incision with alcohol prior to redressing.   Constipation Prevention   Complete by:  As directed    Drink plenty of fluids.  Prune juice may be helpful.  You may use a stool softener, such as Colace (over the counter) 100 mg twice a day.  Use MiraLax (over the counter) for constipation as needed.   Diet - low sodium heart healthy   Complete by:  As directed    Discharge instructions   Complete by:  As directed    Dr. Gaynelle Arabian Total Joint Specialist [facility] [address], [place], [place] [address] [phone]  TOTAL KNEE REPLACEMENT POSTOPERATIVE DIRECTIONS  Knee Rehabilitation, Guidelines Following  Surgery  Results after knee surgery are often greatly improved when you follow the exercise, range of motion and muscle strengthening exercises prescribed by your doctor. Safety measures are also important to protect the knee from further injury. Any time any of these exercises cause you to have increased pain or swelling in your knee joint, decrease the amount until you are comfortable again and slowly increase them. If you have problems or questions,  call your caregiver or physical therapist for advice.   HOME CARE INSTRUCTIONS  Remove items at home which could result in a fall. This includes throw rugs or furniture in walking pathways.  ICE to the affected knee every three hours for 30 minutes at a time and then as needed for pain and swelling.  Continue to use ice on the knee for pain and swelling from surgery. You may notice swelling that will progress down to the foot and ankle.  This is normal after surgery.  Elevate the leg when you are not up walking on it.   Continue to use the breathing machine which will help keep your temperature down.  It is common for your temperature to cycle up and down following surgery, especially at night when you are not up moving around and exerting yourself.  The breathing machine keeps your lungs expanded and your temperature down. Do not place pillow under knee, focus on keeping the knee straight while resting   DIET You may resume your previous home diet once your are discharged from the hospital.  DRESSING / WOUND CARE / SHOWERING You may shower 3 days after surgery, but keep the wounds dry during showering.  You may use an occlusive plastic wrap (Press'n Seal for example), NO SOAKING/SUBMERGING IN THE BATHTUB.  If the bandage gets wet, change with a clean dry gauze.  If the incision gets wet, pat the wound dry with a clean towel. You may start showering once you are discharged home but do not submerge the incision under water. Just pat the incision dry and apply a dry gauze dressing on daily. Change the surgical dressing daily and reapply a dry dressing each time.  ACTIVITY Walk with your walker as instructed. Use walker as long as suggested by your caregivers. Avoid periods of inactivity such as sitting longer than an hour when not asleep. This helps prevent blood clots.  You may resume a sexual relationship in one month or when given the OK by your doctor.  You may return to work once you are  cleared by your doctor.  Do not drive a car for 6 weeks or until released by you surgeon.  Do not drive while taking narcotics.  WEIGHT BEARING Weight bearing as tolerated with assist device (walker, cane, etc) as directed, use it as long as suggested by your surgeon or therapist, typically at least 4-6 weeks.  POSTOPERATIVE CONSTIPATION PROTOCOL Constipation - defined medically as fewer than three stools per week and severe constipation as less than one stool per week.  One of the most common issues patients have following surgery is constipation.  Even if you have a regular bowel pattern at home, your normal regimen is likely to be disrupted due to multiple reasons following surgery.  Combination of anesthesia, postoperative narcotics, change in appetite and fluid intake all can affect your bowels.  In order to avoid complications following surgery, here are some recommendations in order to help you during your recovery period.  Colace (docusate) - Pick up an over-the-counter form of Colace  or another stool softener and take twice a day as long as you are requiring postoperative pain medications.  Take with a full glass of water daily.  If you experience loose stools or diarrhea, hold the colace until you stool forms back up.  If your symptoms do not get better within 1 week or if they get worse, check with your doctor.  Dulcolax (bisacodyl) - Pick up over-the-counter and take as directed by the product packaging as needed to assist with the movement of your bowels.  Take with a full glass of water.  Use this product as needed if not relieved by Colace only.   MiraLax (polyethylene glycol) - Pick up over-the-counter to have on hand.  MiraLax is a solution that will increase the amount of water in your bowels to assist with bowel movements.  Take as directed and can mix with a glass of water, juice, soda, coffee, or tea.  Take if you go more than two days without a movement. Do not use MiraLax more  than once per day. Call your doctor if you are still constipated or irregular after using this medication for 7 days in a row.  If you continue to have problems with postoperative constipation, please contact the office for further assistance and recommendations.  If you experience "the worst abdominal pain ever" or develop nausea or vomiting, please contact the office immediatly for further recommendations for treatment.  ITCHING  If you experience itching with your medications, try taking only a single pain pill, or even half a pain pill at a time.  You can also use Benadryl over the counter for itching or also to help with sleep.   TED HOSE STOCKINGS Wear the elastic stockings on both legs for three weeks following surgery during the day but you may remove then at night for sleeping.  MEDICATIONS See your medication summary on the "After Visit Summary" that the nursing staff will review with you prior to discharge.  You may have some home medications which will be placed on hold until you complete the course of blood thinner medication.  It is important for you to complete the blood thinner medication as prescribed by your surgeon.  Continue your approved medications as instructed at time of discharge.  PRECAUTIONS If you experience chest pain or shortness of breath - call 911 immediately for transfer to the hospital emergency department.  If you develop a fever greater that 101 F, purulent drainage from wound, increased redness or drainage from wound, foul odor from the wound/dressing, or calf pain - CONTACT YOUR SURGEON.                                                   FOLLOW-UP APPOINTMENTS Make sure you keep all of your appointments after your operation with your surgeon and caregivers. You should call the office at the above phone number and make an appointment for approximately two weeks after the date of your surgery or on the date instructed by your surgeon outlined in the "After Visit  Summary".   RANGE OF MOTION AND STRENGTHENING EXERCISES  Rehabilitation of the knee is important following a knee injury or an operation. After just a few days of immobilization, the muscles of the thigh which control the knee become weakened and shrink (atrophy). Knee exercises are designed to build up the  tone and strength of the thigh muscles and to improve knee motion. Often times heat used for twenty to thirty minutes before working out will loosen up your tissues and help with improving the range of motion but do not use heat for the first two weeks following surgery. These exercises can be done on a training (exercise) mat, on the floor, on a table or on a bed. Use what ever works the best and is most comfortable for you Knee exercises include:  Leg Lifts - While your knee is still immobilized in a splint or cast, you can do straight leg raises. Lift the leg to 60 degrees, hold for 3 sec, and slowly lower the leg. Repeat 10-20 times 2-3 times daily. Perform this exercise against resistance later as your knee gets better.  Quad and Hamstring Sets - Tighten up the muscle on the front of the thigh (Quad) and hold for 5-10 sec. Repeat this 10-20 times hourly. Hamstring sets are done by pushing the foot backward against an object and holding for 5-10 sec. Repeat as with quad sets.  Leg Slides: Lying on your back, slowly slide your foot toward your buttocks, bending your knee up off the floor (only go as far as is comfortable). Then slowly slide your foot back down until your leg is flat on the floor again. Angel Wings: Lying on your back spread your legs to the side as far apart as you can without causing discomfort.  A rehabilitation program following serious knee injuries can speed recovery and prevent re-injury in the future due to weakened muscles. Contact your doctor or a physical therapist for more information on knee rehabilitation.   IF YOU ARE TRANSFERRED TO A SKILLED REHAB FACILITY If the  patient is transferred to a skilled rehab facility following release from the hospital, a list of the current medications will be sent to the facility for the patient to continue.  When discharged from the skilled rehab facility, please have the facility set up the patient's [place] prior to being released. Also, the skilled facility will be responsible for providing the patient with their medications at time of release from the facility to include their pain medication, the muscle relaxants, and their blood thinner medication. If the patient is still at the rehab facility at time of the two week follow up appointment, the skilled rehab facility will also need to assist the patient in arranging follow up appointment in our office and any transportation needs.  MAKE SURE YOU:  Understand these instructions.  Get help right away if you are not doing well or get worse.    Pick up stool softner and laxative for home use following surgery while on pain medications. Do not submerge incision under water. Please use good hand washing techniques while changing dressing each day. May shower starting three days after surgery. Please use a clean towel to pat the incision dry following showers. Continue to use ice for pain and swelling after surgery. Do not use any lotions or creams on the incision until instructed by your surgeon.   Do not put a pillow under the knee. Place it under the heel.   Complete by:  As directed    Driving restrictions   Complete by:  As directed    No driving for 2 weeks   TED hose   Complete by:  As directed    Use stockings (TED hose) for three weeks on both leg(s).  You may remove them  at night for sleeping.   Weight bearing as tolerated   Complete by:  As directed      Allergies as of 03/11/2018      Reactions   Adhesive [tape] Other (See Comments)   Skin turns red   Diphenhydramine Other (See Comments)   Pt feels wired    Vicodin  [hydrocodone-acetaminophen] Nausea And Vomiting      Medication List    TAKE these medications   acetaminophen 325 MG tablet Commonly known as:  TYLENOL Take 1-2 tablets (325-650 mg total) by mouth every 6 (six) hours as needed for mild pain.   atorvastatin 20 MG tablet Commonly known as:  LIPITOR TAKE 2 TABLETS (40 MG TOTAL) BY MOUTH EVERY EVENING.   bisacodyl 10 MG suppository Commonly known as:  DULCOLAX Place 1 suppository (10 mg total) rectally daily as needed for moderate constipation.   calcium-vitamin D 500-200 MG-UNIT tablet Take 1 tablet by mouth 2 (two) times daily with a meal.   carvedilol 12.5 MG tablet Commonly known as:  COREG TAKE 1 TABLET IN THE MORNING AND 1.5 TABLETS IN THE EVENING   cetirizine 10 MG tablet Commonly known as:  ZYRTEC Take 10 mg by mouth at bedtime as needed for allergies.   colesevelam 625 MG tablet Commonly known as:  WELCHOL TAKE 3 TABLETS (1,875 MG TOTAL) BY MOUTH 2 (TWO) TIMES DAILY WITH A MEAL.   DEXILANT 60 MG capsule Generic drug:  dexlansoprazole TAKE ONE CAPSULE BY MOUTH EVERY DAY   docusate sodium 100 MG capsule Commonly known as:  COLACE Take 1 capsule (100 mg total) by mouth 2 (two) times daily.   ELIQUIS 5 MG Tabs tablet Generic drug:  apixaban TAKE 1 TABLET BY MOUTH TWICE A DAY   fluticasone 50 MCG/ACT nasal spray Commonly known as:  FLONASE USE 2 SPRAYS IN EACH NOSTRIL DAILY FOR 3 MONTHS What changed:    how much to take  how to take this  when to take this  reasons to take this  additional instructions   furosemide 20 MG tablet Commonly known as:  LASIX TAKE 1 TABLET (20 MG TOTAL) BY MOUTH DAILY.   gabapentin 100 MG capsule Commonly known as:  NEURONTIN Take 2 capsules (200 mg total) by mouth 3 (three) times daily. Take 200 mg three times a day for two weeks, Then 200 mg twice a day for two weeks, Then 200 mg once a day for two weeks, then discontinue the Gabapentin.   glucose blood test  strip accu check comfort curve Checks FBS qam   losartan 100 MG tablet Commonly known as:  COZAAR Take 1 tablet (100 mg total) by mouth daily.   meclizine 25 MG tablet Commonly known as:  ANTIVERT Take 1 tablet (25 mg total) by mouth 3 (three) times daily as needed for dizziness.   methocarbamol 500 MG tablet Commonly known as:  ROBAXIN Take 1 tablet (500 mg total) by mouth every 6 (six) hours as needed for muscle spasms.   metoCLOPramide 5 MG tablet Commonly known as:  REGLAN Take 1-2 tablets (5-10 mg total) by mouth every 8 (eight) hours as needed for nausea (if ondansetron (ZOFRAN) ineffective.).   ondansetron 4 MG tablet Commonly known as:  ZOFRAN Take 1 tablet (4 mg total) by mouth every 6 (six) hours as needed for nausea.   ONE TOUCH ULTRA 2 w/Device Kit Check FBS qam - Dx:250.00 and she will need lancets (1 box) , strips (1 bottle = 100) and controls also  thanks   polyethylene glycol packet Commonly known as:  MIRALAX / GLYCOLAX Take 17 g by mouth daily as needed for mild constipation.   sodium phosphate 7-19 GM/118ML Enem Place 133 mLs (1 enema total) rectally once as needed for severe constipation.   spironolactone 25 MG tablet Commonly known as:  ALDACTONE TAKE 1 TABLET BY MOUTH EVERY DAY   traMADol 50 MG tablet Commonly known as:  ULTRAM Take 1-2 tablets (50-100 mg total) by mouth every 6 (six) hours as needed for moderate pain. What changed:    how much to take  when to take this  reasons to take this   vitamin C 500 MG tablet Commonly known as:  ASCORBIC ACID Take 500 mg by mouth 2 (two) times daily.   zafirlukast 20 MG tablet Commonly known as:  ACCOLATE TAKE 1 TABLET (20 MG TOTAL) BY MOUTH 2 (TWO) TIMES DAILY.            Durable Medical Equipment  (From admission, onward)        Start     Ordered   03/08/18 1604  For home use only DME Walker rolling  Once    Question:  Patient needs a walker to treat with the following condition   Answer:  S/P knee surgery   03/08/18 1604       Discharge Care Instructions  (From admission, onward)        Start     Ordered   03/07/18 0000  Weight bearing as tolerated     03/07/18 0915   03/07/18 0000  Change dressing    Comments:  Change dressing on Wednesday (06/05), then change the dressing daily with sterile 4 x 4 inch gauze dressing and apply TED hose.  You may clean the incision with alcohol prior to redressing.   03/07/18 0915     Follow-up Information    Gaynelle Arabian, MD. Schedule an appointment as soon as possible for a visit on 03/22/2018.   Specialty:  Orthopedic Surgery Contact information: [address] [place] [address] [phone]           Signed: Theresa Duty, PA-C Orthopaedic Surgery 03/11/2018, 3:21 PM

## 2018-03-11 NOTE — Progress Notes (Addendum)
Physical Therapy Treatment Patient Details Name: DIANNA EWALD MRN: [id number] DOB: [birth year] Today's Date: 03/11/2018    History of Present Illness 72 yo female s/p R TKA 03/07/18.     PT Comments    Progressing slowly with mobility. Pt walked a grand total of ~75 feet with 3 seated rest breaks taken when needed (see details below). She continues to c/o fatigue, weakness, dizziness, and pain. Discussed d/c plan with husband. He feels he can manage pt's care at home. He has requested she spend some time in the [facility] prior to d/c home later today-made ortho tech aware. Pt's husband stated that if pt cannot tolerate [facility], he prefers for her to go to rehab to get the therapy she will need. In the event pt returns home, issued HEP for pt to perform 3x/day until she begins HHPT. All education completed-made RN aware.     Follow Up Recommendations  Follow surgeon's recommendation for DC plan and follow-up therapies(may need ST [facility]hab at [facility])     Equipment Recommendations  Rolling walker with 5" wheels    Recommendations for Other Services       Precautions / Restrictions Precautions Precautions: Fall Required Braces or Orthoses: Knee Immobilizer - Right Knee Immobilizer - Right: Discontinue once straight leg raise with < 10 degree lag Restrictions Weight Bearing Restrictions: No Other Position/Activity Restrictions: WBAT    Mobility  Bed Mobility Overal bed mobility: Needs Assistance Bed Mobility: Supine to Sit;Sit to Supine     Supine to sit: Min assist;HOB elevated Sit to supine: Min assist;HOB elevated   General bed mobility comments: Assist for R LE. Increased time.   Transfers Overall transfer level: Needs assistance Equipment used: Rolling walker (2 wheeled) Transfers: Sit to/from Stand Sit to Stand: Min guard         General transfer comment: Close guard for safety. VCs safety, hand/LE placement. Increased time.   Ambulation/Gait Ambulation/Gait assistance: Min  guard Ambulation Distance (Feet): 30 Feet(30'x1, 15'x3) Assistive device: Rolling walker (2 wheeled) Gait Pattern/deviations: Step-to pattern;Trunk flexed;Antalgic;Decreased stance time - right     General Gait Details: Close guard for safety. Followed with recliner for rest breaks. Slow gait speed. 3 seated rest breaks due to fatigue, some dizziness, and pain.    Stairs             Wheelchair Mobility    Modified Rankin (Stroke Patients Only)       Balance Overall balance assessment: Needs assistance         Standing balance support: Bilateral upper extremity supported Standing balance-Leahy Scale: Poor                              Cognition Arousal/Alertness: Awake/alert Behavior During Therapy: WFL for tasks assessed/performed Overall Cognitive Status: Within Functional Limits for tasks assessed                                        Exercises   General Comments        Pertinent Vitals/Pain Pain Assessment: 0-10 Pain Score: 8  Pain Location: R knee Pain Descriptors / Indicators: Aching;Burning;Sore;Discomfort;Grimacing;Tender Pain Intervention(s): Limited activity within patient's tolerance;Repositioned;Ice applied    Home Living                      Prior Function  PT Goals (current goals can now be found in the care plan section) Progress towards PT goals: Progressing toward goals(slowly)    Frequency    7X/week      PT Plan Current plan remains appropriate    Co-evaluation              AM-PAC PT "6 Clicks" Daily Activity  Outcome Measure  Difficulty turning over in bed (including adjusting bedclothes, sheets and blankets)?: A Lot Difficulty moving from lying on back to sitting on the side of the bed? : Unable Difficulty sitting down on and standing up from a chair with arms (e.g., wheelchair, bedside commode, etc,.)?: Unable Help needed moving to and from a bed to chair  (including a wheelchair)?: A Little Help needed walking in hospital room?: A Little Help needed climbing 3-5 steps with a railing? : Total 6 Click Score: 11    End of Session Equipment Utilized During Treatment: Gait belt Activity Tolerance: Patient limited by fatigue;Patient limited by pain Patient left: in bed;with call bell/phone within reach;with family/visitor present   PT Visit Diagnosis: Difficulty in walking, not elsewhere classified (R26.2);Pain Pain - Right/Left: Right Pain - part of body: Knee     Time: 6546-5035 PT Time Calculation (min) (ACUTE ONLY): 33 min  Charges:  $Gait Training: 23-37 mins $Therapeutic Exercise: 8-22 mins                    G Codes:          Weston Anna, MPT Pager: [phone]

## 2018-03-11 NOTE — Progress Notes (Signed)
Physical Therapy Treatment Patient Details Name: [name] MRN: [id number] DOB: 13-Nov-1945 Today's Date: 03/11/2018    History of Present Illness 72 yo female s/p R TKA 03/07/18.     PT Comments    Pt continues to have difficulty with mobility and activity tolerance. She walked ~15 feet x 2 this am. She continues to c/o pain and dizziness. Assessed BP a few minutes after pt walked from bathroom to recliner- 108/61. Will continue to progress activity as able.     Follow Up Recommendations  Follow surgeon's recommendation for DC plan and follow-up therapies (may need ST rehab SNF)     Equipment Recommendations  Rolling walker with 5" wheels    Recommendations for Other Services       Precautions / Restrictions Precautions Precautions: Fall Required Braces or Orthoses: Knee Immobilizer - Right Knee Immobilizer - Right: Discontinue once straight leg raise with < 10 degree lag Restrictions Weight Bearing Restrictions: No Other Position/Activity Restrictions: WBAT    Mobility  Bed Mobility               General bed mobility comments: oob in bathroom with NT  Transfers Overall transfer level: Needs assistance Equipment used: Rolling walker (2 wheeled) Transfers: Sit to/from Stand Sit to Stand: Min assist         General transfer comment: Assist to rise, stabilize, control descent. VCs safety, technique, hand/LE placement. Increased time.   Ambulation/Gait Ambulation/Gait assistance: Min assist Ambulation Distance (Feet): 15 Feet(x2) Assistive device: Rolling walker (2 wheeled) Gait Pattern/deviations: Step-to pattern;Trunk flexed;Antalgic;Decreased stance time - right     General Gait Details: VCs safety, technique, sequence, posture. Assist to stabilize pt throughout distance.  Pt c/o dizziness and pain. 1 seated rest break taken/needed. Husband followed with recliner   Stairs             Wheelchair Mobility    Modified Rankin (Stroke Patients  Only)       Balance                                            Cognition Arousal/Alertness: Awake/alert Behavior During Therapy: WFL for tasks assessed/performed Overall Cognitive Status: Within Functional Limits for tasks assessed                                        Exercises Total Joint Exercises Ankle Circles/Pumps: AROM;Both;Supine;10 reps Quad Sets: AROM;Both;Supine;10 reps Heel Slides: AAROM;Right;Supine;10 reps Hip ABduction/ADduction: AAROM;Right;Supine;10 reps Straight Leg Raises: AAROM;Right;Supine;10 reps Goniometric ROM: ~10-50 degrees  (pt used sheet to assist with ROM)    General Comments        Pertinent Vitals/Pain Pain Assessment: 0-10 Pain Score: 8  Pain Location: R knee Pain Descriptors / Indicators: Aching;Burning;Sore;Discomfort;Grimacing;Tender Pain Intervention(s): Limited activity within patient's tolerance;Repositioned;Ice applied    Home Living                      Prior Function            PT Goals (current goals can now be found in the care plan section) Progress towards PT goals: Progressing toward goals(very slowly)    Frequency    7X/week      PT Plan Current plan remains appropriate    Co-evaluation  AM-PAC PT "6 Clicks" Daily Activity  Outcome Measure  Difficulty turning over in bed (including adjusting bedclothes, sheets and blankets)?: A Lot Difficulty moving from lying on back to sitting on the side of the bed? : Unable Difficulty sitting down on and standing up from a chair with arms (e.g., wheelchair, bedside commode, etc,.)?: Unable Help needed moving to and from a bed to chair (including a wheelchair)?: A Lot Help needed walking in hospital room?: A Lot Help needed climbing 3-5 steps with a railing? : Total 6 Click Score: 9    End of Session Equipment Utilized During Treatment: Gait belt Activity Tolerance: Patient limited by fatigue;Patient  limited by pain Patient left: in bed;with call bell/phone within reach;with family/visitor present   PT Visit Diagnosis: Difficulty in walking, not elsewhere classified (R26.2);Pain Pain - Right/Left: Right Pain - part of body: Knee     Time: 0940-1008 PT Time Calculation (min) (ACUTE ONLY): 28 min  Charges:  $Gait Training: 8-22 mins $Therapeutic Exercise: 8-22 mins                    G Codes:         Weston Anna, MPT Pager: [phone]

## 2018-03-11 NOTE — Care Plan (Signed)
DCP update Patient will need HHC prior to OP PT.  [place] PT orders given to [facility].

## 2018-03-12 LAB — GLUCOSE, CAPILLARY: Glucose-Capillary: 121 mg/dL — ABNORMAL HIGH (ref 65–99)

## 2018-03-12 NOTE — Progress Notes (Signed)
Subjective: 5 Days Post-Op Procedure(s) (LRB): RIGHT TOTAL KNEE ARTHROPLASTY (Right)  Patient reports pain as mild to moderate.  Reports that she feels much better today.  Tolerating POs well. Denies fever, chills, N/V, SOB, CP.  Admits to flatus.  States that she is ready to go home.  Objective:   VITALS:  Temp:  [98 F (36.7 C)-98.5 F (36.9 C)] 98.4 F (36.9 C) (06/08 0841) Pulse Rate:  [89-95] 91 (06/08 0841) Resp:  [14-18] 18 (06/08 0841) BP: (114-142)/(61-77) 114/76 (06/08 0841) SpO2:  [96 %-97 %] 97 % (06/08 0841)  General: WDWN patient in NAD. Psych:  Appropriate mood and affect. Neuro:  A&O x 3, Moving all extremities, sensation intact to light touch HEENT:  EOMs intact Chest:  Even non-labored respirations Skin:  Dressing C/D/I, no rashes or lesions Extremities: warm/dry, mild edema, no erythema or echymosis.  No lymphadenopathy. Pulses: Popliteus 2+ MSK:  ROM: lacks 5 degrees of TKE, MMT: able to perform quad set, (-) Homan's    LABS Recent Labs    03/10/18 0514  HGB 10.4*  WBC 10.5  PLT 246   No results for input(s): NA, K, CL, CO2, BUN, CREATININE, GLUCOSE in the last 72 hours. No results for input(s): LABPT, INR in the last 72 hours.   Assessment/Plan: 5 Days Post-Op Procedure(s) (LRB): RIGHT TOTAL KNEE ARTHROPLASTY (Right)  Patient seen in rounds for Dr. Onnie Graham LE D/C home today with HHPT Scripts on chart. Plan for 2 week outpatient post-op visit with Dr. Francia Greaves PA-C [facility] Office:  [phone]

## 2018-03-12 NOTE — Care Management Important Message (Signed)
Important Message  Patient Details  Name: [name] MRN: [id number] Date of Birth: [birth year]   Medicare Important Message Given:  Yes    Erenest Rasher, RN 03/12/2018, 12:46 PM

## 2018-03-12 NOTE — Progress Notes (Signed)
CSW consult -SNF placement Plan: Home Health.  CSW signing off.   Kathrin Greathouse, Latanya Presser, MSW Clinical Social Worker  [phone] 03/12/2018  9:56 AM

## 2018-03-12 NOTE — Progress Notes (Signed)
Physical Therapy Treatment Patient Details Name: [name] MRN: [id number] DOB: [birth year] Today's Date: 03/12/2018    History of Present Illness 72 yo female s/p R TKA 03/07/18.     PT Comments    Pt still with a lot of pain with very little movement or even light touch to the R LE . No unusual redness or swelling noted. Took a lot of time to educate pt and husband with car transfer, bed mobility, ambulation, KI use , importance of exercises and knee extension with also knee flexion to prevent the knee from tightening up. Reviewed HEP handout , and all safety areas.    Follow Up Recommendations  Follow surgeon's recommendation for DC plan and follow-up therapies(may need ST rehab at [facility])     Equipment Recommendations  Rolling walker with 5" wheels    Recommendations for Other Services       Precautions / Restrictions Precautions Precautions: Fall Required Braces or Orthoses: Knee Immobilizer - Right Knee Immobilizer - Right: Discontinue once straight leg raise with < 10 degree lag Restrictions Weight Bearing Restrictions: No Other Position/Activity Restrictions: WBAT    Mobility  Bed Mobility Overal bed mobility: Needs Assistance Bed Mobility: Sit to Supine       Sit to supine: Mod assist   General bed mobility comments: Assist for R LE. Increased time. pt used rail and trapeze   Transfers Overall transfer level: Needs assistance Equipment used: Rolling walker (2 wheeled) Transfers: Sit to/from Stand Sit to Stand: Min guard         General transfer comment: Close guard for safety. VCs safety, hand/LE placement. Increased time. pt with a lot of pain with very little movement  Ambulation/Gait Ambulation/Gait assistance: Min guard Ambulation Distance (Feet): 30 Feet Assistive device: Rolling walker (2 wheeled) Gait Pattern/deviations: Step-to pattern;Trunk flexed;Antalgic;Decreased stance time - right     General Gait Details: Close guard for safety.  Slow  gait speed. 3 seated rest breaks due to fatigue, some dizziness, and pain. Tolerated slightly better today, but did not want to increase distance due to pt going home today as well.    Stairs             Wheelchair Mobility    Modified Rankin (Stroke Patients Only)       Balance Overall balance assessment: Needs assistance         Standing balance support: Bilateral upper extremity supported Standing balance-Leahy Scale: Poor                              Cognition Arousal/Alertness: Awake/alert Behavior During Therapy: WFL for tasks assessed/performed Overall Cognitive Status: Within Functional Limits for tasks assessed                                        Exercises Total Joint Exercises Ankle Circles/Pumps: AROM;Both;Supine;10 reps Quad Sets: AROM;Both;Supine;10 reps Heel Slides: AAROM;Right;Supine;5 reps Hip ABduction/ADduction: AAROM;Right;Supine;10 reps Straight Leg Raises: AAROM;Right;Supine;5 reps    General Comments        Pertinent Vitals/Pain Pain Score: 8  Pain Location: R knee very sensitive even to light touch anywhere on that leg especially on back of calf. Pt states MD looked at this are earlier today. Pt states she has sensistive skin, was very mindful of how I handled and touche her leg  Pain Descriptors / Indicators:  Aching;Burning;Sore;Discomfort;Grimacing;Tender Pain Intervention(s): Limited activity within patient's tolerance;Monitored during session;Premedicated before session;Ice applied    Home Living                      Prior Function            PT Goals (current goals can now be found in the care plan section) Acute Rehab PT Goals Patient Stated Goal: less pain.  PT Goal Formulation: With patient/family Time For Goal Achievement: 03/21/18 Potential to Achieve Goals: Good Progress towards PT goals: Progressing toward goals    Frequency    7X/week      PT Plan Current plan  remains appropriate    Co-evaluation              AM-PAC PT "6 Clicks" Daily Activity  Outcome Measure  Difficulty turning over in bed (including adjusting bedclothes, sheets and blankets)?: A Lot Difficulty moving from lying on back to sitting on the side of the bed? : Unable Difficulty sitting down on and standing up from a chair with arms (e.g., wheelchair, bedside commode, etc,.)?: Unable Help needed moving to and from a bed to chair (including a wheelchair)?: A Little Help needed walking in hospital room?: A Little Help needed climbing 3-5 steps with a railing? : Total 6 Click Score: 11    End of Session Equipment Utilized During Treatment: Gait belt Activity Tolerance: Patient limited by fatigue;Patient limited by pain Patient left: in bed;with call bell/phone within reach;with family/visitor present   PT Visit Diagnosis: Difficulty in walking, not elsewhere classified (R26.2);Pain Pain - Right/Left: Right Pain - part of body: Knee     Time: 0488-8916 PT Time Calculation (min) (ACUTE ONLY): 56 min  Charges:  $Gait Training: 8-22 mins $Therapeutic Exercise: 8-22 mins $Therapeutic Activity: 23-37 mins                    G Codes:       [name], PT Pager: [phone] 03/12/2018    Jeri Jeanbaptiste, Gatha Mayer 03/12/2018, 1:36 PM

## 2018-03-12 NOTE — Care Management Note (Signed)
Case Management Note  Patient Details  Name: [name] MRN: [id number] Date of Birth: 12-Jun-1946  Subjective/Objective:    Right TKA                Action/Plan: NCM spoke to pt and offered choice for HH/list provided. Pt agreeable to [facility] for [facility]. Has RW and elevated commode seat at home. Husband at home to assist with care.   Expected Discharge Date:  03/12/18               Expected Discharge Plan:  [place]  In-House Referral:  NA  Discharge planning Services  CM Consult  Post Acute Care Choice:  Home Health Choice offered to:  Patient  DME Arranged:  N/A DME Agency:  NA  HH Arranged:  [place] Agency:  [facility] (formerly [employer])  Status of Service:  Completed, signed off  If discussed at [employer], dates discussed:    Additional Comments:  Erenest Rasher, RN 03/12/2018, 12:44 PM

## 2018-03-13 DIAGNOSIS — E119 Type 2 diabetes mellitus without complications: Secondary | ICD-10-CM | POA: Diagnosis not present

## 2018-03-13 DIAGNOSIS — I480 Paroxysmal atrial fibrillation: Secondary | ICD-10-CM | POA: Diagnosis not present

## 2018-03-13 DIAGNOSIS — C50912 Malignant neoplasm of unspecified site of left female breast: Secondary | ICD-10-CM | POA: Diagnosis not present

## 2018-03-13 DIAGNOSIS — Z471 Aftercare following joint replacement surgery: Secondary | ICD-10-CM | POA: Diagnosis not present

## 2018-03-13 DIAGNOSIS — I10 Essential (primary) hypertension: Secondary | ICD-10-CM | POA: Diagnosis not present

## 2018-03-13 DIAGNOSIS — C50911 Malignant neoplasm of unspecified site of right female breast: Secondary | ICD-10-CM | POA: Diagnosis not present

## 2018-03-14 DIAGNOSIS — Z471 Aftercare following joint replacement surgery: Secondary | ICD-10-CM | POA: Diagnosis not present

## 2018-03-14 DIAGNOSIS — E119 Type 2 diabetes mellitus without complications: Secondary | ICD-10-CM | POA: Diagnosis not present

## 2018-03-14 DIAGNOSIS — I480 Paroxysmal atrial fibrillation: Secondary | ICD-10-CM | POA: Diagnosis not present

## 2018-03-14 DIAGNOSIS — C50911 Malignant neoplasm of unspecified site of right female breast: Secondary | ICD-10-CM | POA: Diagnosis not present

## 2018-03-14 DIAGNOSIS — C50912 Malignant neoplasm of unspecified site of left female breast: Secondary | ICD-10-CM | POA: Diagnosis not present

## 2018-03-14 DIAGNOSIS — I10 Essential (primary) hypertension: Secondary | ICD-10-CM | POA: Diagnosis not present

## 2018-03-16 DIAGNOSIS — E119 Type 2 diabetes mellitus without complications: Secondary | ICD-10-CM | POA: Diagnosis not present

## 2018-03-16 DIAGNOSIS — I480 Paroxysmal atrial fibrillation: Secondary | ICD-10-CM | POA: Diagnosis not present

## 2018-03-16 DIAGNOSIS — C50911 Malignant neoplasm of unspecified site of right female breast: Secondary | ICD-10-CM | POA: Diagnosis not present

## 2018-03-16 DIAGNOSIS — Z471 Aftercare following joint replacement surgery: Secondary | ICD-10-CM | POA: Diagnosis not present

## 2018-03-16 DIAGNOSIS — C50912 Malignant neoplasm of unspecified site of left female breast: Secondary | ICD-10-CM | POA: Diagnosis not present

## 2018-03-16 DIAGNOSIS — I10 Essential (primary) hypertension: Secondary | ICD-10-CM | POA: Diagnosis not present

## 2018-03-18 DIAGNOSIS — I480 Paroxysmal atrial fibrillation: Secondary | ICD-10-CM | POA: Diagnosis not present

## 2018-03-18 DIAGNOSIS — C50911 Malignant neoplasm of unspecified site of right female breast: Secondary | ICD-10-CM | POA: Diagnosis not present

## 2018-03-18 DIAGNOSIS — I10 Essential (primary) hypertension: Secondary | ICD-10-CM | POA: Diagnosis not present

## 2018-03-18 DIAGNOSIS — C50912 Malignant neoplasm of unspecified site of left female breast: Secondary | ICD-10-CM | POA: Diagnosis not present

## 2018-03-18 DIAGNOSIS — E119 Type 2 diabetes mellitus without complications: Secondary | ICD-10-CM | POA: Diagnosis not present

## 2018-03-18 DIAGNOSIS — Z471 Aftercare following joint replacement surgery: Secondary | ICD-10-CM | POA: Diagnosis not present

## 2018-03-21 DIAGNOSIS — Z471 Aftercare following joint replacement surgery: Secondary | ICD-10-CM | POA: Diagnosis not present

## 2018-03-21 DIAGNOSIS — C50912 Malignant neoplasm of unspecified site of left female breast: Secondary | ICD-10-CM | POA: Diagnosis not present

## 2018-03-21 DIAGNOSIS — E119 Type 2 diabetes mellitus without complications: Secondary | ICD-10-CM | POA: Diagnosis not present

## 2018-03-21 DIAGNOSIS — I10 Essential (primary) hypertension: Secondary | ICD-10-CM | POA: Diagnosis not present

## 2018-03-21 DIAGNOSIS — C50911 Malignant neoplasm of unspecified site of right female breast: Secondary | ICD-10-CM | POA: Diagnosis not present

## 2018-03-21 DIAGNOSIS — I480 Paroxysmal atrial fibrillation: Secondary | ICD-10-CM | POA: Diagnosis not present

## 2018-03-22 DIAGNOSIS — M25561 Pain in right knee: Secondary | ICD-10-CM | POA: Diagnosis not present

## 2018-03-24 DIAGNOSIS — M25561 Pain in right knee: Secondary | ICD-10-CM | POA: Diagnosis not present

## 2018-03-28 DIAGNOSIS — M25561 Pain in right knee: Secondary | ICD-10-CM | POA: Diagnosis not present

## 2018-03-30 DIAGNOSIS — M25561 Pain in right knee: Secondary | ICD-10-CM | POA: Diagnosis not present

## 2018-04-01 DIAGNOSIS — M25561 Pain in right knee: Secondary | ICD-10-CM | POA: Diagnosis not present

## 2018-04-04 DIAGNOSIS — M25561 Pain in right knee: Secondary | ICD-10-CM | POA: Diagnosis not present

## 2018-04-06 DIAGNOSIS — M25561 Pain in right knee: Secondary | ICD-10-CM | POA: Diagnosis not present

## 2018-04-08 DIAGNOSIS — M25561 Pain in right knee: Secondary | ICD-10-CM | POA: Diagnosis not present

## 2018-04-11 DIAGNOSIS — M25561 Pain in right knee: Secondary | ICD-10-CM | POA: Diagnosis not present

## 2018-04-12 DIAGNOSIS — Z96651 Presence of right artificial knee joint: Secondary | ICD-10-CM | POA: Diagnosis not present

## 2018-04-12 DIAGNOSIS — Z471 Aftercare following joint replacement surgery: Secondary | ICD-10-CM | POA: Diagnosis not present

## 2018-04-13 DIAGNOSIS — M25561 Pain in right knee: Secondary | ICD-10-CM | POA: Diagnosis not present

## 2018-04-15 DIAGNOSIS — M25561 Pain in right knee: Secondary | ICD-10-CM | POA: Diagnosis not present

## 2018-04-20 DIAGNOSIS — M25561 Pain in right knee: Secondary | ICD-10-CM | POA: Diagnosis not present

## 2018-04-21 ENCOUNTER — Encounter (HOSPITAL_COMMUNITY): Payer: Self-pay

## 2018-04-21 ENCOUNTER — Emergency Department (HOSPITAL_COMMUNITY): Payer: Medicare Other

## 2018-04-21 ENCOUNTER — Emergency Department (HOSPITAL_COMMUNITY)
Admission: EM | Admit: 2018-04-21 | Discharge: 2018-04-21 | Disposition: A | Discharge status: Home / Self Care | Payer: Medicare Other | Attending: Emergency Medicine | Admitting: Emergency Medicine

## 2018-04-21 DIAGNOSIS — R42 Dizziness and giddiness: Secondary | ICD-10-CM | POA: Diagnosis not present

## 2018-04-21 DIAGNOSIS — Z96651 Presence of right artificial knee joint: Secondary | ICD-10-CM | POA: Diagnosis not present

## 2018-04-21 DIAGNOSIS — Z7901 Long term (current) use of anticoagulants: Secondary | ICD-10-CM | POA: Insufficient documentation

## 2018-04-21 DIAGNOSIS — I10 Essential (primary) hypertension: Secondary | ICD-10-CM | POA: Diagnosis not present

## 2018-04-21 DIAGNOSIS — E119 Type 2 diabetes mellitus without complications: Secondary | ICD-10-CM | POA: Diagnosis not present

## 2018-04-21 DIAGNOSIS — Z853 Personal history of malignant neoplasm of breast: Secondary | ICD-10-CM | POA: Diagnosis not present

## 2018-04-21 DIAGNOSIS — J45909 Unspecified asthma, uncomplicated: Secondary | ICD-10-CM | POA: Insufficient documentation

## 2018-04-21 DIAGNOSIS — Z049 Encounter for examination and observation for unspecified reason: Secondary | ICD-10-CM | POA: Diagnosis not present

## 2018-04-21 DIAGNOSIS — R531 Weakness: Secondary | ICD-10-CM | POA: Diagnosis not present

## 2018-04-21 DIAGNOSIS — R0602 Shortness of breath: Secondary | ICD-10-CM | POA: Diagnosis not present

## 2018-04-21 DIAGNOSIS — Z79899 Other long term (current) drug therapy: Secondary | ICD-10-CM | POA: Diagnosis not present

## 2018-04-21 DIAGNOSIS — I447 Left bundle-branch block, unspecified: Secondary | ICD-10-CM | POA: Diagnosis not present

## 2018-04-21 DIAGNOSIS — T675XXA Heat exhaustion, unspecified, initial encounter: Secondary | ICD-10-CM | POA: Diagnosis not present

## 2018-04-21 DIAGNOSIS — E860 Dehydration: Secondary | ICD-10-CM

## 2018-04-21 LAB — CBC WITH DIFFERENTIAL/PLATELET
Abs Immature Granulocytes: 0 10*3/uL (ref 0.0–0.1)
BASOS ABS: 0 10*3/uL (ref 0.0–0.1)
BASOS PCT: 1 %
EOS ABS: 0.2 10*3/uL (ref 0.0–0.7)
EOS PCT: 2 %
HCT: 34 % — ABNORMAL LOW (ref 36.0–46.0)
Hemoglobin: 10.7 g/dL — ABNORMAL LOW (ref 12.0–15.0)
Immature Granulocytes: 0 %
Lymphocytes Relative: 22 %
Lymphs Abs: 1.7 10*3/uL (ref 0.7–4.0)
MCH: 31 pg (ref 26.0–34.0)
MCHC: 31.5 g/dL (ref 30.0–36.0)
MCV: 98.6 fL (ref 78.0–100.0)
MONO ABS: 0.8 10*3/uL (ref 0.1–1.0)
Monocytes Relative: 10 %
Neutro Abs: 5 10*3/uL (ref 1.7–7.7)
Neutrophils Relative %: 65 %
PLATELETS: 284 10*3/uL (ref 150–400)
RBC: 3.45 MIL/uL — AB (ref 3.87–5.11)
RDW: 12.1 % (ref 11.5–15.5)
WBC: 7.6 10*3/uL (ref 4.0–10.5)

## 2018-04-21 LAB — BASIC METABOLIC PANEL
Anion gap: 8 (ref 5–15)
BUN: 19 mg/dL (ref 8–23)
CALCIUM: 9.1 mg/dL (ref 8.9–10.3)
CO2: 24 mmol/L (ref 22–32)
Chloride: 106 mmol/L (ref 98–111)
Creatinine, Ser: 1.02 mg/dL — ABNORMAL HIGH (ref 0.44–1.00)
GFR calc Af Amer: >60 mL/min (ref >60)
GFR, EST NON AFRICAN AMERICAN: 53 mL/min — AB (ref >60)
GLUCOSE: 121 mg/dL — AB (ref 70–99)
Potassium: 4.3 mmol/L (ref 3.5–5.1)
SODIUM: 138 mmol/L (ref 135–145)

## 2018-04-21 MED ORDER — SODIUM CHLORIDE 0.9 % IV BOLUS
500.0000 mL | Freq: Once | INTRAVENOUS | Status: AC
  Administered 2018-04-21: 500 mL via INTRAVENOUS

## 2018-04-21 NOTE — ED Notes (Signed)
Pt given water. Ok by [employer]

## 2018-04-21 NOTE — ED Notes (Signed)
Pt ambulatory to restroom

## 2018-04-21 NOTE — ED Provider Notes (Signed)
[facility] EMERGENCY DEPARTMENT Provider Note   CSN: [id number] Arrival date & time: 04/21/18  1057     History   Chief Complaint Chief Complaint  Patient presents with  . Dizziness    HPI [name] is a 72 y.o. female with a history of paroxysmal afib on elloquis, HF (EF 50-55% with grade 1 diastolic dysfunction), Y5OP, HTN, HLD, LBBB, and breast cancer (2014) who presents with light-headedness and SOB.   Patient reports that 2 hours prior to presentation she became light-headed and SOB while picking blueberries. She immediately sat down. Her symptoms continued for several more minutes. She then decided to take her Carvedilol without improvement of symptoms.  She became concerned and then EMS was called. Per EMS, patient was hypotensive inially and she was given 500cc NS. Patient reports that her symptoms resolved completely with the IVFs.   Of note, she underwent right knee replacement 2 weeks prior to presentation and has been undergoing physical therapy. She states that this morning was the first time that she has been able to get up and do extraneous work.   Patient denies LOC, dizziness, headache, weakness. She denies chest pain, leg swelling, fever.   Past Medical History:  Diagnosis Date  . Allergy history unknown   . Arthritis    oa  . Asthma    takes acolate for  . Breast cancer ([place]) 1999; 2008   hx of bilateral breast cancer  . Breast cancer, left breast ([place]) 10/27/2012   3.2 cm ER positive, Her-2 negative, 1 node positive S/P mastectomy/TTRAM reconstruction 4/08  . Breast cancer, right breast ([place]) 10/27/2012   3.2 cm ER/PR positive, 1 node positive, Her-2 negative Rx w mastectomy, AC -T chemo, followed by aromatase inhibitor x 5 years dx 4/08  . Cancer [facility])    breast CA  . Diabetes ([place])   . DM2 (diabetes mellitus, type 2) ([place])    on welchol for  . Dyspnea    with exertion  . Elevated cholesterol   . GERD (gastroesophageal reflux  disease)   . Hiatal hernia   . Hypertension   . LBBB (left bundle branch block) 10/27/2012  . Leg cramps   . NICM (nonischemic cardiomyopathy) ([place])    a. Echo:  06/14/12: Poor endocardial definition, possible septal HK, EF 50-55%, normal wall motion, mild LAE ;  b.  [facility] 9/13:  normal cors, EF 35-40%,  c. Echo 7/14: EF 50-55%, mild LAE  . Other fatigue 01/01/2015  . Pulmonary embolus ([place]) 2001   after tram flap surgery    Patient Active Problem List   Diagnosis Date Noted  . OA (osteoarthritis) of knee 03/07/2018  . Genetic testing 06/14/2015  . Paroxysmal atrial fibrillation ([place]) 02/12/2015  . Other fatigue 01/01/2015  . Cough 11/07/2012  . Asthma 11/07/2012  . LBBB (left bundle branch block) 10/27/2012  . Breast cancer, left breast ([place]) 10/27/2012  . Breast cancer, right breast ([place]) 10/27/2012  . Nonischemic cardiomyopathy ([place]) 09/21/2012  . Chest pain 07/20/2012  . DM w/o complication type II ([place]) 10/30/2011  . DYSPNEA 07/25/2009  . Hyperlipidemia 09/19/2008  . Essential hypertension 09/19/2008    Past Surgical History:  Procedure Laterality Date  . DILATION AND CURETTAGE OF UTERUS   41yr ago   x 2  . KNEE SURGERY  2006   left torn cartilage repair  . LEFT HEART CATHETERIZATION WITH CORONARY ANGIOGRAM N/A 06/15/2012   Procedure: LEFT HEART CATHETERIZATION WITH CORONARY ANGIOGRAM;  Surgeon: MRogue Jury  Ferne Reus, MD;  Location: [facility] CATH LAB;  Service: Cardiovascular;  Laterality: N/A;  . MASTECTOMY     bilateral  . RECONSTRUCTION BREAST W/ TRAM FLAP  2001  . TOTAL KNEE ARTHROPLASTY Right 03/07/2018   Procedure: RIGHT TOTAL KNEE ARTHROPLASTY;  Surgeon: Gaynelle Arabian, MD;  Location: [facility] ORS;  Service: Orthopedics;  Laterality: Right;     OB History   None      Home Medications    Prior to Admission medications   Medication Sig Start Date End Date Taking? Authorizing Provider  acetaminophen (TYLENOL) 325 MG tablet Take 1-2 tablets (325-650 mg total) by mouth every 6  (six) hours as needed for mild pain. 03/11/18   Edmisten, Kristie L, PA  atorvastatin (LIPITOR) 20 MG tablet TAKE 2 TABLETS (40 MG TOTAL) BY MOUTH EVERY EVENING. 10/04/17   Susy Frizzle, MD  bisacodyl (DULCOLAX) 10 MG suppository Place 1 suppository (10 mg total) rectally daily as needed for moderate constipation. 03/11/18   Edmisten, Kristie L, PA  Blood Glucose Monitoring Suppl (ONE TOUCH ULTRA 2) W/DEVICE KIT Check FBS qam - Dx:250.00 and she will need lancets (1 box) , strips (1 bottle = 100) and controls also thanks 09/01/13   Susy Frizzle, MD  Calcium Carbonate-Vitamin D (CALCIUM-VITAMIN D) 500-200 MG-UNIT per tablet Take 1 tablet by mouth 2 (two) times daily with a meal.     [provider]  carvedilol (COREG) 12.5 MG tablet TAKE 1 TABLET IN THE MORNING AND 1.5 TABLETS IN THE EVENING 02/24/18   Bensimhon, Shaune Pascal, MD  cetirizine (ZYRTEC) 10 MG tablet Take 10 mg by mouth at bedtime as needed for allergies.     [provider]  colesevelam (WELCHOL) 625 MG tablet TAKE 3 TABLETS (1,875 MG TOTAL) BY MOUTH 2 (TWO) TIMES DAILY WITH A MEAL. 01/31/18   Susy Frizzle, MD  DEXILANT 60 MG capsule TAKE ONE CAPSULE BY MOUTH EVERY DAY 09/03/17   Susy Frizzle, MD  docusate sodium (COLACE) 100 MG capsule Take 1 capsule (100 mg total) by mouth 2 (two) times daily. 03/11/18   Edmisten, Kristie L, PA  ELIQUIS 5 MG TABS tablet TAKE 1 TABLET BY MOUTH TWICE A DAY 02/04/18   Larey Dresser, MD  fluticasone [facility]) 50 MCG/ACT nasal spray USE 2 SPRAYS IN EACH NOSTRIL DAILY FOR 3 MONTHS Patient taking differently: Place 2 sprays into both nostrils daily as needed (for allergies/congestion.). USE 2 SPRAYS IN EACH NOSTRIL DAILY FOR 3 MONTHS 12/10/17   Susy Frizzle, MD  furosemide (LASIX) 20 MG tablet TAKE 1 TABLET (20 MG TOTAL) BY MOUTH DAILY. 09/17/17   Larey Dresser, MD  gabapentin (NEURONTIN) 100 MG capsule Take 2 capsules (200 mg total) by mouth 3 (three) times daily. Take 200 mg  three times a day for two weeks, Then 200 mg twice a day for two weeks, Then 200 mg once a day for two weeks, then discontinue the Gabapentin. 03/11/18   Edmisten, Kristie L, PA  glucose blood test strip accu check comfort curve Checks FBS qam 10/11/15   Susy Frizzle, MD  losartan (COZAAR) 100 MG tablet Take 1 tablet (100 mg total) by mouth daily. 05/25/17   Shirley Friar, PA-C  meclizine (ANTIVERT) 25 MG tablet Take 1 tablet (25 mg total) by mouth 3 (three) times daily as needed for dizziness. 03/05/17   Susy Frizzle, MD  methocarbamol (ROBAXIN) 500 MG tablet Take 1 tablet (500 mg total) by mouth every 6 (  six) hours as needed for muscle spasms. 03/11/18   Edmisten, Ok Anis, PA  metoCLOPramide (REGLAN) 5 MG tablet Take 1-2 tablets (5-10 mg total) by mouth every 8 (eight) hours as needed for nausea (if ondansetron (ZOFRAN) ineffective.). 03/11/18   Edmisten, Kristie L, PA  ondansetron (ZOFRAN) 4 MG tablet Take 1 tablet (4 mg total) by mouth every 6 (six) hours as needed for nausea. 03/11/18   Edmisten, Kristie L, PA  polyethylene glycol (MIRALAX / GLYCOLAX) packet Take 17 g by mouth daily as needed for mild constipation. 03/11/18   Edmisten, Kristie L, PA  sodium phosphate (FLEET) 7-19 GM/118ML ENEM Place 133 mLs (1 enema total) rectally once as needed for severe constipation. 03/11/18   Edmisten, Kristie L, PA  spironolactone (ALDACTONE) 25 MG tablet TAKE 1 TABLET BY MOUTH EVERY DAY 01/27/18   Bensimhon, Shaune Pascal, MD  traMADol (ULTRAM) 50 MG tablet Take 1-2 tablets (50-100 mg total) by mouth every 6 (six) hours as needed for moderate pain. 03/11/18   Edmisten, Ok Anis, PA  vitamin C (ASCORBIC ACID) 500 MG tablet Take 500 mg by mouth 2 (two) times daily.      [provider]  zafirlukast (ACCOLATE) 20 MG tablet TAKE 1 TABLET (20 MG TOTAL) BY MOUTH 2 (TWO) TIMES DAILY. 02/08/17   Susy Frizzle, MD    Family History Family History  Problem Relation Age of Onset  . Asthma Maternal  Grandmother   . Breast cancer Maternal Grandmother        dx. 49s  . Stomach cancer Maternal Grandmother 60       lots of stomach problems  . Heart attack Paternal Grandfather   . Other Daughter        cyst on ovary; unilateral oophorectomy  . Heart attack Brother   . Cancer Maternal Aunt        unknown type  . Celiac disease Maternal Aunt   . Diabetes Maternal Uncle   . Colon cancer Cousin        dx. 50s-60s  . Colon cancer Paternal Aunt        dx. late 69s  . Lung cancer Paternal Aunt        dx. late 60s-70s; former smoker  . Heart attack Paternal Aunt   . Heart attack Paternal Uncle     Social History Social History   Tobacco Use  . Smoking status: Never Smoker  . Smokeless tobacco: Never Used  Substance Use Topics  . Alcohol use: No  . Drug use: No     Allergies   Adhesive [tape]; Diphenhydramine; and Vicodin [hydrocodone-acetaminophen]   Review of Systems Review of Systems  Constitutional: Negative for activity change, appetite change, diaphoresis, fatigue and fever.  HENT: Negative for congestion and sore throat.   Respiratory: Positive for shortness of breath. Negative for chest tightness.   Cardiovascular: Negative for chest pain, palpitations and leg swelling.  Gastrointestinal: Negative for abdominal pain, diarrhea, nausea and vomiting.  Genitourinary: Negative for dysuria, hematuria and urgency.  Skin: Negative for rash.  Neurological: Positive for light-headedness. Negative for dizziness, weakness, numbness and headaches.  All other systems reviewed and are negative.    Physical Exam Updated Vital Signs There were no vitals taken for this visit.  Physical Exam  Constitutional: She appears well-developed and well-nourished.  HENT:  Head: Normocephalic and atraumatic.  Eyes: EOM are normal.  Cardiovascular: Normal rate and regular rhythm.  Pulmonary/Chest: Effort normal and breath sounds normal.  Abdominal: Soft. Bowel sounds  are normal.    Neurological: She is alert. No cranial nerve deficit or sensory deficit. She exhibits normal muscle tone. Coordination normal.  Skin: Skin is warm and dry.  Psychiatric: She has a normal mood and affect. Her behavior is normal.  Nursing note and vitals reviewed.    ED Treatments / Results  Labs (all labs ordered are listed, but only abnormal results are displayed) Labs Reviewed  BASIC METABOLIC PANEL - Abnormal; Notable for the following components:      Result Value   Glucose, Bld 121 (*)    Creatinine, Ser 1.02 (*)    GFR calc non Af Amer 53 (*)    All other components within normal limits  CBC WITH DIFFERENTIAL/PLATELET - Abnormal; Notable for the following components:   RBC 3.45 (*)    Hemoglobin 10.7 (*)    HCT 34.0 (*)    All other components within normal limits    EKG Left bundle branch block (seen on previous EKG's).  Radiology Dg Chest 2 View  Result Date: 04/21/2018 CLINICAL DATA:  Shortness of breath associated dehydration. EXAM: CHEST - 2 VIEW COMPARISON:  CT chest 01/02/2015 FINDINGS: Heart size normal. Lungs are clear. There is no edema or effusion. No focal airspace disease is present. Surgical clips are present in the left axilla. The visualized soft tissues and bony thorax are otherwise unremarkable. IMPRESSION: No active cardiopulmonary disease. Electronically Signed   By: San Morelle M.D.   On: 04/21/2018 12:46    Procedures Procedures (including critical care time)  Medications Ordered in ED Medications  sodium chloride 0.9 % bolus 500 mL (0 mLs Intravenous Stopped 04/21/18 1243)     Initial Impression / Assessment and Plan / ED Course  I have reviewed the triage vital signs and the nursing notes.  Pertinent labs & imaging results that were available during my care of the patient were reviewed by me and considered in my medical decision making (see chart for details).  [name] is a 72 y.o. female with a history of paroxysmal afib,  nonischemic cardiomyopathy, T2DM, HTN, HLD, LBBB, and breast cancer (2014) who presents with light-headedness and shortness of breath in the setting of working outside on a hot day is most consistent with dehydration. Patient's symptoms resolved with NS. ACS less likely given no acute ischemic changes on EKG and lack of chest pain. Low suspicion for PE given she is currently on Elloquis, doesn't have tachycardia, and lacks leg swelling. - S/p 1L NS - VS WNL - EKG: LBBB (seen on previous EKG's); No acute ischemic changes.  - CBC and BMP unremarkable. - Prior to discharge patient's symptoms have completely resolved. Recommended drinking 8-10 glasses of water.  Final Clinical Impressions(s) / ED Diagnoses   Final diagnoses:  Dehydration    ED Discharge Orders    None       Carroll Sage, MD 04/21/18 1302    Lajean Saver, MD 04/21/18 [phone]

## 2018-04-21 NOTE — ED Triage Notes (Signed)
Pt brought by EMS due to being dizzy, hot, and SOB while picking blueberries. Pt was hypotensive initially for EMS and was given 500cc of NS. Pt has hx of HF. Pt a&ox4.

## 2018-04-21 NOTE — ED Notes (Signed)
Pt ambulatory to the restroom without difficulty. Pt states she "feels great" at this time

## 2018-04-22 DIAGNOSIS — M25561 Pain in right knee: Secondary | ICD-10-CM | POA: Diagnosis not present

## 2018-04-25 ENCOUNTER — Encounter: Payer: Self-pay | Admitting: Family Medicine

## 2018-04-25 ENCOUNTER — Other Ambulatory Visit: Payer: Self-pay | Admitting: Family Medicine

## 2018-04-25 ENCOUNTER — Other Ambulatory Visit (HOSPITAL_COMMUNITY): Payer: Self-pay | Admitting: Student

## 2018-04-25 ENCOUNTER — Ambulatory Visit (INDEPENDENT_AMBULATORY_CARE_PROVIDER_SITE_OTHER): Payer: Medicare Other | Admitting: Family Medicine

## 2018-04-25 VITALS — BP 100/58 | HR 86 | Temp 97.9°F | Resp 16 | Ht 62.0 in | Wt 174.0 lb

## 2018-04-25 DIAGNOSIS — I959 Hypotension, unspecified: Secondary | ICD-10-CM

## 2018-04-25 DIAGNOSIS — R5383 Other fatigue: Secondary | ICD-10-CM

## 2018-04-25 NOTE — Progress Notes (Signed)
Subjective:    Patient ID: [name], female    DOB: [birth year], 72 y.o.   MRN: [id number]  HPI Patient presents today for ER follow-up.  She had a right knee replacement in June.  She is done relatively well postoperatively and is now almost 7 weeks postoperative.  Earlier this month, she was picking blueberries.  She sat down and immediately started feeling lightheaded as though she was unable to catch her breath.  They called EMS and she was taken to the emergency room.  In the emergency room, chest x-ray was clear.  CBC was significant for mild anemia with a hemoglobin of 10.7.  Patient's baseline is around 11.5.  CMP was unremarkable.  She was diagnosed with dehydration.  She was feeling better by the time she got to the emergency room.  She is here today for follow-up.  She states that ever since her surgery, she feels extremely weak and fatigued.  She also reports feeling no stamina.  She denies any further episodes of shortness of breath.  She denies any chest pain, pleurisy, hemoptysis, cough.  She has no pitting edema in her legs.  She is on Eliquis 5 mg twice daily and therefore I believe a DVT/PE is unlikely.  At the present time she is mainly complaining of fatigue and feeling weak and tired. Past Medical History:  Diagnosis Date  . Allergy history unknown   . Arthritis    oa  . Asthma    takes acolate for  . Breast cancer ([place]) 1999; 2008   hx of bilateral breast cancer  . Breast cancer, left breast ([place]) 10/27/2012   3.2 cm ER positive, Her-2 negative, 1 node positive S/P mastectomy/TTRAM reconstruction 4/08  . Breast cancer, right breast ([place]) 10/27/2012   3.2 cm ER/PR positive, 1 node positive, Her-2 negative Rx w mastectomy, AC -T chemo, followed by aromatase inhibitor x 5 years dx 4/08  . Cancer [facility])    breast CA  . Diabetes ([place])   . DM2 (diabetes mellitus, type 2) ([place])    on welchol for  . Dyspnea    with exertion  . Elevated cholesterol   . GERD  (gastroesophageal reflux disease)   . Hiatal hernia   . Hypertension   . LBBB (left bundle branch block) 10/27/2012  . Leg cramps   . NICM (nonischemic cardiomyopathy) ([place])    a. Echo:  06/14/12: Poor endocardial definition, possible septal HK, EF 50-55%, normal wall motion, mild LAE ;  b.  [facility] 9/13:  normal cors, EF 35-40%,  c. Echo 7/14: EF 50-55%, mild LAE  . Other fatigue 01/01/2015  . Pulmonary embolus ([place]) 2001   after tram flap surgery   Past Surgical History:  Procedure Laterality Date  . DILATION AND CURETTAGE OF UTERUS   36yr ago   x 2  . KNEE SURGERY  2006   left torn cartilage repair  . LEFT HEART CATHETERIZATION WITH CORONARY ANGIOGRAM N/A 06/15/2012   Procedure: LEFT HEART CATHETERIZATION WITH CORONARY ANGIOGRAM;  Surgeon: MWellington Hampshire MD;  [facility];  Service: Cardiovascular;  Laterality: N/A;  . MASTECTOMY     bilateral  . RECONSTRUCTION BREAST W/ TRAM FLAP  2001  . TOTAL KNEE ARTHROPLASTY Right 03/07/2018   Procedure: RIGHT TOTAL KNEE ARTHROPLASTY;  Surgeon: AGaynelle Arabian MD;  [facility];  Service: Orthopedics;  Laterality: Right;   Current Outpatient Medications on File Prior to Visit  Medication Sig Dispense Refill  . atorvastatin (LIPITOR)  20 MG tablet TAKE 2 TABLETS (40 MG TOTAL) BY MOUTH EVERY EVENING. 180 tablet 2  . Blood Glucose Monitoring Suppl (ONE TOUCH ULTRA 2) W/DEVICE KIT Check FBS qam - Dx:250.00 and she will need lancets (1 box) , strips (1 bottle = 100) and controls also thanks 1 each 0  . carvedilol (COREG) 12.5 MG tablet TAKE 1 TABLET IN THE MORNING AND 1.5 TABLETS IN THE EVENING 210 tablet 3  . cetirizine (ZYRTEC) 10 MG tablet Take 10 mg by mouth at bedtime as needed for allergies.     . colesevelam (WELCHOL) 625 MG tablet TAKE 3 TABLETS (1,875 MG TOTAL) BY MOUTH 2 (TWO) TIMES DAILY WITH A MEAL. 540 tablet 3  . DEXILANT 60 MG capsule TAKE ONE CAPSULE BY MOUTH EVERY DAY 90 capsule 3  . ELIQUIS 5 MG TABS tablet TAKE 1 TABLET BY  MOUTH TWICE A DAY 180 tablet 3  . fluticasone (FLONASE) 50 MCG/ACT nasal spray USE 2 SPRAYS IN EACH NOSTRIL DAILY FOR 3 MONTHS (Patient taking differently: Place 2 sprays into both nostrils daily as needed (for allergies/congestion.). USE 2 SPRAYS IN EACH NOSTRIL DAILY FOR 3 MONTHS) 48 g 3  . furosemide (LASIX) 20 MG tablet TAKE 1 TABLET (20 MG TOTAL) BY MOUTH DAILY. 90 tablet 3  . glucose blood test strip accu check comfort curve Checks FBS qam 100 each 5  . losartan (COZAAR) 100 MG tablet Take 1 tablet (100 mg total) by mouth daily. 90 tablet 3  . spironolactone (ALDACTONE) 25 MG tablet TAKE 1 TABLET BY MOUTH EVERY DAY 90 tablet 3  . vitamin C (ASCORBIC ACID) 500 MG tablet Take 500 mg by mouth 2 (two) times daily.       No current facility-administered medications on file prior to visit.    Allergies  Allergen Reactions  . Adhesive [Tape] Other (See Comments)    Skin turns red   . Diphenhydramine Other (See Comments)    Pt feels wired   . Vicodin [Hydrocodone-Acetaminophen] Nausea And Vomiting   Social History   Socioeconomic History  . Marital status: Married    Spouse name: Not on file  . Number of children: 2  . Years of education: Not on file  . Highest education level: Not on file  Occupational History  . 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 Financial resource strain: Not on file  . Food insecurity:    Worry: Not on file    Inability: Not on file  . Transportation needs:    Medical: Not on file    Non-medical: Not on file  Tobacco Use  . Smoking status: Never Smoker  . Smokeless tobacco: Never Used  Substance and Sexual Activity  . Alcohol use: No  . Drug use: No  . Sexual activity: Never  Lifestyle  . Physical activity:    Days per week: Not on file    Minutes per session: Not on file  . Stress: Not on file  Relationships  . Social connections:    Talks on phone: Not on file    Gets together: Not  on file    Attends religious service: Not on file    Active member of club or organization: Not on file    Attends meetings of clubs or organizations: Not on file    Relationship status: Not on file  . Intimate partner violence:    Fear of current or ex partner: Not  on file    Emotionally abused: Not on file    Physically abused: Not on file    Forced sexual activity: Not on file  Other Topics Concern  . Not on file  Social History Narrative  . Not on file      Review of Systems  All other systems reviewed and are negative.      Objective:   Physical Exam  Constitutional: She appears well-developed and well-nourished. No distress.  Cardiovascular: Normal rate, regular rhythm and normal heart sounds.  Pulmonary/Chest: Effort normal and breath sounds normal. No stridor. No respiratory distress. She has no wheezes. She has no rales.  Abdominal: Soft. Bowel sounds are normal. She exhibits no distension and no mass. There is no tenderness. There is no guarding.  Musculoskeletal: She exhibits no edema.  Skin: She is not diaphoretic. No erythema.  Vitals reviewed.         Assessment & Plan:  Hypotension, unspecified hypotension type  Fatigue, unspecified type  I believe the patient's fatigue, weakness, poor stamina could be relative hypotension.  Rarely discontinue Lasix and losartan.  Encouraged the patient to drink more fluids over the next week.  Reassess in 1 week.  Likely resume losartan at that point due to her underlying cardiomyopathy.  Resume Lasix if there is evidence that the patient is retaining fluid.

## 2018-04-26 DIAGNOSIS — M25561 Pain in right knee: Secondary | ICD-10-CM | POA: Diagnosis not present

## 2018-04-29 DIAGNOSIS — M25561 Pain in right knee: Secondary | ICD-10-CM | POA: Diagnosis not present

## 2018-05-02 ENCOUNTER — Ambulatory Visit (INDEPENDENT_AMBULATORY_CARE_PROVIDER_SITE_OTHER): Payer: Medicare Other | Admitting: Family Medicine

## 2018-05-02 ENCOUNTER — Encounter: Payer: Self-pay | Admitting: Family Medicine

## 2018-05-02 VITALS — BP 110/60 | HR 82 | Temp 97.8°F | Resp 16 | Ht 62.0 in | Wt 175.0 lb

## 2018-05-02 DIAGNOSIS — I959 Hypotension, unspecified: Secondary | ICD-10-CM | POA: Diagnosis not present

## 2018-05-02 NOTE — Progress Notes (Signed)
Subjective:    Patient ID: [name], female    DOB: [birth year], 72 y.o.   MRN: [id number]  HPI  04/25/18 Patient presents today for ER follow-up.  She had a right knee replacement in June.  She is done relatively well postoperatively and is now almost 7 weeks postoperative.  Earlier this month, she was picking blueberries.  She sat down and immediately started feeling lightheaded as though she was unable to catch her breath.  They called EMS and she was taken to the emergency room.  In the emergency room, chest x-ray was clear.  CBC was significant for mild anemia with a hemoglobin of 10.7.  Patient's baseline is around 11.5.  CMP was unremarkable.  She was diagnosed with dehydration.  She was feeling better by the time she got to the emergency room.  She is here today for follow-up.  She states that ever since her surgery, she feels extremely weak and fatigued.  She also reports feeling no stamina.  She denies any further episodes of shortness of breath.  She denies any chest pain, pleurisy, hemoptysis, cough.  She has no pitting edema in her legs.  She is on Eliquis 5 mg twice daily and therefore I believe a DVT/PE is unlikely.  At the present time she is mainly complaining of fatigue and feeling weak and tired.  At that time, my plan was:  I believe the patient's fatigue, weakness, poor stamina could be relative hypotension.  Rarely discontinue Lasix and losartan.  Encouraged the patient to drink more fluids over the next week.  Reassess in 1 week.  Likely resume losartan at that point due to her underlying cardiomyopathy.  Resume Lasix if there is evidence that the patient is retaining fluid.   05/02/18 Patient held the Lasix.  Her blood pressure has improved throughout the week.  She has been checking at home and finding it to be between 110 and 120/60-70.  I personally rechecked her blood pressure here and found it to be 110/60 in both arms.  She states she feels much better holding the  Lasix.  She denies any orthopnea or shortness of breath or paroxysmal nocturnal dyspnea.  She never felt comfortable discontinuing losartan and therefore she is still taking the medication. Past Medical History:  Diagnosis Date  . Allergy history unknown   . Arthritis    oa  . Asthma    takes acolate for  . Breast cancer ([place]) 1999; 2008   hx of bilateral breast cancer  . Breast cancer, left breast ([place]) 10/27/2012   3.2 cm ER positive, Her-2 negative, 1 node positive S/P mastectomy/TTRAM reconstruction 4/08  . Breast cancer, right breast ([place]) 10/27/2012   3.2 cm ER/PR positive, 1 node positive, Her-2 negative Rx w mastectomy, AC -T chemo, followed by aromatase inhibitor x 5 years dx 4/08  . Cancer [facility])    breast CA  . Diabetes ([place])   . DM2 (diabetes mellitus, type 2) ([place])    on welchol for  . Dyspnea    with exertion  . Elevated cholesterol   . GERD (gastroesophageal reflux disease)   . Hiatal hernia   . Hypertension   . LBBB (left bundle branch block) 10/27/2012  . Leg cramps   . NICM (nonischemic cardiomyopathy) ([place])    a. Echo:  06/14/12: Poor endocardial definition, possible septal HK, EF 50-55%, normal wall motion, mild LAE ;  b.  [facility] 9/13:  normal cors, EF 35-40%,  c. Echo 7/14: EF  50-55%, mild LAE  . Other fatigue 01/01/2015  . Pulmonary embolus ([place]) 2001   after tram flap surgery   Past Surgical History:  Procedure Laterality Date  . DILATION AND CURETTAGE OF UTERUS   51yr ago   x 2  . KNEE SURGERY  2006   left torn cartilage repair  . LEFT HEART CATHETERIZATION WITH CORONARY ANGIOGRAM N/A 06/15/2012   Procedure: LEFT HEART CATHETERIZATION WITH CORONARY ANGIOGRAM;  Surgeon: MWellington Hampshire MD;  [facility];  Service: Cardiovascular;  Laterality: N/A;  . MASTECTOMY     bilateral  . RECONSTRUCTION BREAST W/ TRAM FLAP  2001  . TOTAL KNEE ARTHROPLASTY Right 03/07/2018   Procedure: RIGHT TOTAL KNEE ARTHROPLASTY;  Surgeon: AGaynelle Arabian MD;  [facility];  Service: Orthopedics;  Laterality: Right;   Current Outpatient Medications on File Prior to Visit  Medication Sig Dispense Refill  . atorvastatin (LIPITOR) 20 MG tablet TAKE 2 TABLETS (40 MG TOTAL) BY MOUTH EVERY EVENING. 180 tablet 2  . Blood Glucose Monitoring Suppl (ONE TOUCH ULTRA 2) W/DEVICE KIT Check FBS qam - Dx:250.00 and she will need lancets (1 box) , strips (1 bottle = 100) and controls also thanks 1 each 0  . carvedilol (COREG) 12.5 MG tablet TAKE 1 TABLET IN THE MORNING AND 1.5 TABLETS IN THE EVENING 210 tablet 3  . cetirizine (ZYRTEC) 10 MG tablet Take 10 mg by mouth at bedtime as needed for allergies.     . colesevelam (WELCHOL) 625 MG tablet TAKE 3 TABLETS (1,875 MG TOTAL) BY MOUTH 2 (TWO) TIMES DAILY WITH A MEAL. 540 tablet 3  . DEXILANT 60 MG capsule TAKE ONE CAPSULE BY MOUTH EVERY DAY 90 capsule 3  . ELIQUIS 5 MG TABS tablet TAKE 1 TABLET BY MOUTH TWICE A DAY 180 tablet 3  . fluticasone (FLONASE) 50 MCG/ACT nasal spray USE 2 SPRAYS IN EACH NOSTRIL DAILY FOR 3 MONTHS (Patient taking differently: Place 2 sprays into both nostrils daily as needed (for allergies/congestion.). USE 2 SPRAYS IN EACH NOSTRIL DAILY FOR 3 MONTHS) 48 g 3  . glucose blood test strip accu check comfort curve Checks FBS qam 100 each 5  . losartan (COZAAR) 100 MG tablet TAKE 1 TABLET BY MOUTH EVERY DAY 90 tablet 0  . spironolactone (ALDACTONE) 25 MG tablet TAKE 1 TABLET BY MOUTH EVERY DAY 90 tablet 3  . vitamin C (ASCORBIC ACID) 500 MG tablet Take 500 mg by mouth 2 (two) times daily.      . zafirlukast (ACCOLATE) 20 MG tablet TAKE 1 TABLET (20 MG TOTAL) BY MOUTH 2 (TWO) TIMES DAILY. 180 tablet 4  . furosemide (LASIX) 20 MG tablet TAKE 1 TABLET (20 MG TOTAL) BY MOUTH DAILY. (Patient not taking: Reported on 05/02/2018) 90 tablet 3   No current facility-administered medications on file prior to visit.    Allergies  Allergen Reactions  . Adhesive [Tape] Other (See Comments)    Skin turns red   .  Diphenhydramine Other (See Comments)    Pt feels wired   . Vicodin [Hydrocodone-Acetaminophen] Nausea And Vomiting   Social History   Socioeconomic History  . Marital status: Married    Spouse name: Not on file  . Number of children: 2  . Years of education: Not on file  . Highest education level: Not on file  Occupational History  . 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 Financial resource strain:  Not on file  . Food insecurity:    Worry: Not on file    Inability: Not on file  . Transportation needs:    Medical: Not on file    Non-medical: Not on file  Tobacco Use  . Smoking status: Never Smoker  . Smokeless tobacco: Never Used  Substance and Sexual Activity  . Alcohol use: No  . Drug use: No  . Sexual activity: Never  Lifestyle  . Physical activity:    Days per week: Not on file    Minutes per session: Not on file  . Stress: Not on file  Relationships  . Social connections:    Talks on phone: Not on file    Gets together: Not on file    Attends religious service: Not on file    Active member of club or organization: Not on file    Attends meetings of clubs or organizations: Not on file    Relationship status: Not on file  . Intimate partner violence:    Fear of current or ex partner: Not on file    Emotionally abused: Not on file    Physically abused: Not on file    Forced sexual activity: Not on file  Other Topics Concern  . Not on file  Social History Narrative  . Not on file      Review of Systems  All other systems reviewed and are negative.      Objective:   Physical Exam  Constitutional: She appears well-developed and well-nourished. No distress.  Cardiovascular: Normal rate, regular rhythm and normal heart sounds.  Pulmonary/Chest: Effort normal and breath sounds normal. No stridor. No respiratory distress. She has no wheezes. She has no rales.  Abdominal: Soft. Bowel sounds are  normal. She exhibits no distension and no mass. There is no tenderness. There is no guarding.  Musculoskeletal: She exhibits no edema.  Skin: She is not diaphoretic. No erythema.  Vitals reviewed.         Assessment & Plan:  Hypotension, unspecified hypotension type My nurse's blood pressure is incorrect.  I personally rechecked her blood pressure and found it to be within normal range at 110/60.  Therefore I recommended the patient continue to refrain from using Lasix.  She can stay on losartan as she seems to be tolerating this medication.  I would like her to stay on her beta-blocker and ARB given her history of nonischemic cardiomyopathy.

## 2018-05-03 ENCOUNTER — Ambulatory Visit (HOSPITAL_COMMUNITY)
Admission: RE | Admit: 2018-05-03 | Discharge: 2018-05-03 | Disposition: A | Discharge status: Home / Self Care | Payer: Medicare Other | Source: Ambulatory Visit | Attending: Cardiology | Admitting: Cardiology

## 2018-05-03 ENCOUNTER — Encounter (HOSPITAL_COMMUNITY): Payer: Self-pay | Admitting: Cardiology

## 2018-05-03 VITALS — BP 108/60 | HR 80 | Wt 173.8 lb

## 2018-05-03 DIAGNOSIS — Z79899 Other long term (current) drug therapy: Secondary | ICD-10-CM | POA: Insufficient documentation

## 2018-05-03 DIAGNOSIS — Z9013 Acquired absence of bilateral breasts and nipples: Secondary | ICD-10-CM | POA: Insufficient documentation

## 2018-05-03 DIAGNOSIS — E785 Hyperlipidemia, unspecified: Secondary | ICD-10-CM | POA: Diagnosis not present

## 2018-05-03 DIAGNOSIS — I110 Hypertensive heart disease with heart failure: Secondary | ICD-10-CM | POA: Diagnosis not present

## 2018-05-03 DIAGNOSIS — K219 Gastro-esophageal reflux disease without esophagitis: Secondary | ICD-10-CM | POA: Diagnosis not present

## 2018-05-03 DIAGNOSIS — Z86711 Personal history of pulmonary embolism: Secondary | ICD-10-CM | POA: Diagnosis not present

## 2018-05-03 DIAGNOSIS — Z96651 Presence of right artificial knee joint: Secondary | ICD-10-CM | POA: Insufficient documentation

## 2018-05-03 DIAGNOSIS — I509 Heart failure, unspecified: Secondary | ICD-10-CM | POA: Insufficient documentation

## 2018-05-03 DIAGNOSIS — M25561 Pain in right knee: Secondary | ICD-10-CM | POA: Diagnosis not present

## 2018-05-03 DIAGNOSIS — I471 Supraventricular tachycardia: Secondary | ICD-10-CM | POA: Diagnosis not present

## 2018-05-03 DIAGNOSIS — I428 Other cardiomyopathies: Secondary | ICD-10-CM | POA: Diagnosis not present

## 2018-05-03 DIAGNOSIS — I447 Left bundle-branch block, unspecified: Secondary | ICD-10-CM | POA: Diagnosis not present

## 2018-05-03 DIAGNOSIS — R590 Localized enlarged lymph nodes: Secondary | ICD-10-CM | POA: Diagnosis not present

## 2018-05-03 DIAGNOSIS — Z853 Personal history of malignant neoplasm of breast: Secondary | ICD-10-CM | POA: Diagnosis not present

## 2018-05-03 DIAGNOSIS — Z7901 Long term (current) use of anticoagulants: Secondary | ICD-10-CM | POA: Insufficient documentation

## 2018-05-03 DIAGNOSIS — E119 Type 2 diabetes mellitus without complications: Secondary | ICD-10-CM | POA: Diagnosis not present

## 2018-05-03 DIAGNOSIS — I480 Paroxysmal atrial fibrillation: Secondary | ICD-10-CM | POA: Diagnosis not present

## 2018-05-03 DIAGNOSIS — J45909 Unspecified asthma, uncomplicated: Secondary | ICD-10-CM | POA: Insufficient documentation

## 2018-05-03 NOTE — Progress Notes (Signed)
Patient ID: [name], female   DOB: November 16, 1945, 72 y.o.   MRN: [id number]    Advanced Heart Failure Clinic Note   PCP: Dr. Dennard Schaumann HF: Dr. Aundra Dubin   72 y.o. with history of HTN and type II diabetes presents for followup of CHF.  Back in 2009, she had an echo with normal EF.  She was admitted in 9/13 with chest pain and LBBB.  She had LHC showing no CAD but EF 35-40%.  Echo was a technically difficult study with EF reported as 50-55%.  She has been taking ARB and Coreg.  Repeat echo in 12/13 showed EF 40% with septal hypokinesis.  Cardiac MRI was done in 12/13 as well, with calculated EF 50%, global hypokinesis, and no delayed enhancement.  Echo in 7/15 showed EF 55-60% with mild MR and mild TR (no LBBB at this time).   In 3/16, she began to develop increased dyspnea.  She was seen by Truitt Merle and was noted to have a LBBB again. Echo in 3/16 showed EF down to 35-40%.  She was then seen in followup by Rosaria Ferries.  Alivecor on her phone showed a period of HR up to 160s that appeared to be atrial fibrillation.  She felt tachypalpitations. Coreg was increased and she was started on apixaban 5 mg bid. CPX in 4/16 showed a submaximal effort but normal spirometry and normal peak VO2.  She did have ST changes possibly concerning for ischemia (but has baseline LBBB so hard to interpret).    Cardiolite in 5/16 showed on ischemia or infarction.  Most recent echo in 2/17 showed EF 45-50%, diffuse hypokinesis, grade I diastolic dysfunction, normal RV size and systolic function.  Echo 1/18 with EF stable at 45%.  Echo in 4/19 showed EF 50-55% with septal dyssynergy.    Patient has been doing well from a cardiac standpoint.  No exertional dyspnea or chest pain.  No orthopnea/PND. She had right TKR in 6/19, knee still does not have full range of motion.  No lightheadedness.  He has been off Lasix for a week with no increase in dyspnea or weight.  Weight is down 8 lbs.   ECG (4/19, personally reviewed):  NSR, LBBB  Labs (9/13): TSH 4.884 (elevated), proBNP 155 Labs (10/13): K 3.6, creatinine 0.8 Labs (1/14): K 3.7, creatinine 0.8, BNP not elevated, ANA weakly positive, TSH normal Labs (7/14): BNP normal Labs (11/14): K 4.1, creatinine 0.73 Labs (10/15): K 4.2, creatinine 0.73, LDL 45 Labs (3/16): BNP 46, HCT 42.2, TSH normal Labs (4/16): K 4.1, creatinine 0.78 => 0.8, HCT 39.3 Labs (1/17): K 4.6, creatinine 1.06, hgb 13.1, BNP 22 Labs (10/17): LDL 49, HDL 37, K 4.8, creatinine 0.92, TSH normal, BNP 62 Labs (4/18): LDL 48, K 4.4, creatinine 0.91 Labs (7/19): K 4.3, creatinine 1.02, 11.7  PMH: 1. LBBB/IVCD: Now persistent.   2. Breast cancer s/p bilateral mastectomy. 3. GERD 4. Type II diabetes mellitus 5. HTN 6. Asthma 7. PE in 2001 8. Hyperlipidemia 9. Nonischemic Cardiomyopathy: Possible LBBB cardiomyopathy. Echo 8/09 with 60%.  Admitted in 9/13 with chest pain.  LHC showed no angiographic CAD and EF 35-40%.  Echo at that time was read as showing EF 50-55% but was a technically difficult study.  Echo (12/13): EF 40% with septal hypokinesis, normal RV size and systolic function, no significant valvular abnormalities.  Cardiac MRI (12/13): EF 50%, global hypokinesis, no delayed enhancement.  Echo (7/14) with EF 50-55%, mild LV dilation, mild LAE.  Echo (7/15)  with EF 55-60%, mild MR and mild TR.  Echo (3/16) with EF 35-40%, septal-lateral dyssynchrony.  CPX (4/16) with RER 0.98 (submaximal), peak VO2 19.5, VE/VCO2 slope 29.4 => normal spirometry, normal peak VO2, but ST changes suggest possibility of ischemia.  Lexiscan Cardiolite (5/16) with no ischemia/infarction. - Echo (2/17) with EF 45-50%, diffuse hypokinesis, grade I diastolic dysfunction, normal RV size and systolic function.  - Echo (1/18) with EF 45%, septal-lateral dyssynchrony, septal hypokinesis, normal RV size and systolic function.  - Echo (4/19) with EF 50-55%, mild LVH, septal-lateral dyssynchony.  10. Supraclavicular  lymphadenopathy: Stable by CT back to 2009, suspect benign.  11. Atrial fibrillation: Paroxysmal.  Holter (4/16) with rare PACs and PVCs, short run 5 beats SVT.  12. Right TKR 6/19  SH: Married, lives in [facility], never smoked.  FH: Uncle with ? SCD.  Brother with ? SCD.    ROS: All systems reviewed and negative except as per HPI.   Current Outpatient Medications  Medication Sig Dispense Refill  . atorvastatin (LIPITOR) 20 MG tablet TAKE 2 TABLETS (40 MG TOTAL) BY MOUTH EVERY EVENING. 180 tablet 2  . Blood Glucose Monitoring Suppl (ONE TOUCH ULTRA 2) W/DEVICE KIT Check FBS qam - Dx:250.00 and she will need lancets (1 box) , strips (1 bottle = 100) and controls also thanks 1 each 0  . carvedilol (COREG) 12.5 MG tablet TAKE 1 TABLET IN THE MORNING AND 1.5 TABLETS IN THE EVENING 210 tablet 3  . cetirizine (ZYRTEC) 10 MG tablet Take 10 mg by mouth at bedtime as needed for allergies.     . colesevelam (WELCHOL) 625 MG tablet TAKE 3 TABLETS (1,875 MG TOTAL) BY MOUTH 2 (TWO) TIMES DAILY WITH A MEAL. 540 tablet 3  . DEXILANT 60 MG capsule TAKE ONE CAPSULE BY MOUTH EVERY DAY 90 capsule 3  . ELIQUIS 5 MG TABS tablet TAKE 1 TABLET BY MOUTH TWICE A DAY 180 tablet 3  . fluticasone (FLONASE) 50 MCG/ACT nasal spray USE 2 SPRAYS IN EACH NOSTRIL DAILY FOR 3 MONTHS (Patient taking differently: Place 2 sprays into both nostrils daily as needed (for allergies/congestion.). USE 2 SPRAYS IN EACH NOSTRIL DAILY FOR 3 MONTHS) 48 g 3  . glucose blood test strip accu check comfort curve Checks FBS qam 100 each 5  . losartan (COZAAR) 100 MG tablet TAKE 1 TABLET BY MOUTH EVERY DAY 90 tablet 0  . spironolactone (ALDACTONE) 25 MG tablet TAKE 1 TABLET BY MOUTH EVERY DAY 90 tablet 3  . vitamin C (ASCORBIC ACID) 500 MG tablet Take 500 mg by mouth 2 (two) times daily.      . zafirlukast (ACCOLATE) 20 MG tablet TAKE 1 TABLET (20 MG TOTAL) BY MOUTH 2 (TWO) TIMES DAILY. 180 tablet 4   No current  facility-administered medications for this encounter.     BP 108/60   Pulse 80   Wt 173 lb 12.8 oz (78.8 kg)   SpO2 97%   BMI 31.79 kg/m    Wt Readings from Last 3 Encounters:  05/03/18 173 lb 12.8 oz (78.8 kg)  05/02/18 175 lb (79.4 kg)  04/25/18 174 lb (78.9 kg)   General: NAD Neck: No JVD, no thyromegaly or thyroid nodule.  Lungs: Clear to auscultation bilaterally with normal respiratory effort. CV: Nondisplaced PMI.  Heart regular S1/S2, no S3/S4, no murmur.  Trace ankle edema.  No carotid bruit.  Normal pedal pulses.  Abdomen: Soft, nontender, no hepatosplenomegaly, no distention.  Skin: Intact without lesions or rashes.  Neurologic: Alert and oriented x 3.  Psych: Normal affect. Extremities: No clubbing or cyanosis.  HEENT: Normal.   Assessment/Plan: 1. Nonischemic cardiomyopathy:   No CAD on cath 9/13 but EF 35-40% at that time.  ECG showed LBBB.  LBBB resolved and EF normalized.  However, in 3/16, patient had fatigue and dyspnea => repeat echo with EF back down to 35-40%, she now has a persistent LBBB. This may be a LBBB cardiomyopathy.  CPX in 4/16 was submaximal but showed normal spirometry and actually near normal peak VO2.  Last echo in 4/19 showed EF stable at 50-55% with septal-lateral dyssynchrony.  NYHA class I-II symptoms. Volume status stable on exam. - She can stay off Lasix, use prn.  - Continue Coreg 12.5 qam/18.75 qpm (she cannot tolerate 18.75 bid due to dizziness/fatigue).   - Continue losartan 100 mg daily.  - Continue spironolactone 25 mg daily 2. Hyperlipidemia:  Continue atorvastatin 40 mg qhs. 3. Atrial fibrillation: Paroxysmal.  NSR today.  - Continue Eliquis 5 mg bid.  Recent CBC ok.   4. LBBB: Persistent and may be contributing to cardiomyopathy.   Followup in 6 months.   Loralie Champagne 05/03/2018

## 2018-05-06 DIAGNOSIS — M25561 Pain in right knee: Secondary | ICD-10-CM | POA: Diagnosis not present

## 2018-05-09 DIAGNOSIS — M25561 Pain in right knee: Secondary | ICD-10-CM | POA: Diagnosis not present

## 2018-05-24 ENCOUNTER — Telehealth: Payer: Self-pay | Admitting: Family Medicine

## 2018-05-24 NOTE — Telephone Encounter (Signed)
Patient called [facility] stating that she needed an order faxed to [facility] [employer] for her Mastectomy bras.   Fax: [phone]  Order faxed

## 2018-05-25 NOTE — Progress Notes (Signed)
lov cards Dr Aundra Dubin 05-03-18 epic   ekg 04-22-18 epic   cxr 04-21-18 epic   Echo 01-28-18 epic

## 2018-05-25 NOTE — Patient Instructions (Addendum)
[name]  05/25/2018   Your procedure is scheduled on: 06-01-18   Report to [facility] Main  Entrance    Report to admitting at 1:15PM    Call this number if you have problems the morning of surgery [phone]      Remember: Do not eat food After Midnight.  YOU MAY HAVE CLEAR LIQUIDS FROM MIDNIGHT UNTIL 9:45AM. NOTHING BY MOUTH AFTER 9:45AM!    CLEAR LIQUID DIET   Foods Allowed                                                                     Foods Excluded  Coffee and tea, regular and decaf                             liquids that you cannot  Plain Jell-O in any flavor                                             see through such as: Fruit ices (not with fruit pulp)                                     milk, soups, orange juice  Iced Popsicles                                    All solid food Carbonated beverages, regular and diet                                    Cranberry, grape and apple juices Sports drinks like Gatorade Lightly seasoned clear broth or consume(fat free) Sugar, honey syrup  Sample Menu Breakfast                                Lunch                                     Supper Cranberry juice                    Beef broth                            Chicken broth Jell-O                                     Grape juice                           Apple juice Coffee or  tea                        Jell-O                                      Popsicle                                                Coffee or tea                        Coffee or tea  _____________________________________________________________________       Take these medicines the morning of surgery with A SIP OF WATER: carvedilol, dexilant, zafilukast (accolate), tylenol if needed                                You may not have any metal on your body including hair pins and              piercings  Do not wear jewelry, make-up, lotions, powders or perfumes,  deodorant             Do not wear nail polish.  Do not shave  48 hours prior to surgery.     Do not bring valuables to the hospital. [place].  Contacts, dentures or bridgework may not be worn into surgery.      Patients discharged the day of surgery will not be allowed to drive home.  Name and phone number of your driver:  Special Instructions: N/A              Please read over the following fact sheets you were given: _____________________________________________________________________             [facility] - Preparing for Surgery Before surgery, you can play an important role.  Because skin is not sterile, your skin needs to be as free of germs as possible.  You can reduce the number of germs on your skin by washing with CHG (chlorahexidine gluconate) soap before surgery.  CHG is an antiseptic cleaner which kills germs and bonds with the skin to continue killing germs even after washing. Please DO NOT use if you have an allergy to CHG or antibacterial soaps.  If your skin becomes reddened/irritated stop using the CHG and inform your nurse when you arrive at Short Stay. Do not shave (including legs and underarms) for at least 48 hours prior to the first CHG shower.  You may shave your face/neck. Please follow these instructions carefully:  1.  Shower with CHG Soap the night before surgery and the  morning of Surgery.  2.  If you choose to wash your hair, wash your hair first as usual with your  normal  shampoo.  3.  After you shampoo, rinse your hair and body thoroughly to remove the  shampoo.                           4.  Use CHG as you would any other liquid soap.  You can apply chg directly  to the skin and wash                       Gently with a scrungie or clean washcloth.  5.  Apply the CHG Soap to your body ONLY FROM THE NECK DOWN.   Do not use on face/ open                           Wound or open sores. Avoid contact with  eyes, ears mouth and genitals (private parts).                       Wash face,  Genitals (private parts) with your normal soap.             6.  Wash thoroughly, paying special attention to the area where your surgery  will be performed.  7.  Thoroughly rinse your body with warm water from the neck down.  8.  DO NOT shower/wash with your normal soap after using and rinsing off  the CHG Soap.                9.  Pat yourself dry with a clean towel.            10.  Wear clean pajamas.            11.  Place clean sheets on your bed the night of your first shower and do not  sleep with pets. Day of Surgery : Do not apply any lotions/deodorants the morning of surgery.  Please wear clean clothes to the hospital/surgery center.  FAILURE TO FOLLOW THESE INSTRUCTIONS MAY RESULT IN THE CANCELLATION OF YOUR SURGERY PATIENT SIGNATURE_________________________________  NURSE SIGNATURE__________________________________  ________________________________________________________________________   [name]  An incentive spirometer is a tool that can help keep your lungs clear and active. This tool measures how well you are filling your lungs with each breath. Taking long deep breaths may help reverse or decrease the chance of developing breathing (pulmonary) problems (especially infection) following:  A long period of time when you are unable to move or be active. BEFORE THE PROCEDURE   If the spirometer includes an indicator to show your best effort, your nurse or respiratory therapist will set it to a desired goal.  If possible, sit up straight or lean slightly forward. Try not to slouch.  Hold the incentive spirometer in an upright position. INSTRUCTIONS FOR USE  1. Sit on the edge of your bed if possible, or sit up as far as you can in bed or on a chair. 2. Hold the incentive spirometer in an upright position. 3. Breathe out normally. 4. Place the mouthpiece in your mouth and seal your  lips tightly around it. 5. Breathe in slowly and as deeply as possible, raising the piston or the ball toward the top of the column. 6. Hold your breath for 3-5 seconds or for as long as possible. Allow the piston or ball to fall to the bottom of the column. 7. Remove the mouthpiece from your mouth and breathe out normally. 8. Rest for a few seconds and repeat Steps 1 through 7 at least 10 times every 1-2 hours when you are awake. Take your time and take a few normal breaths between deep breaths. 9. The spirometer may include an indicator to show your best effort. Use the indicator as a goal to work  toward during each repetition. 10. After each set of 10 deep breaths, practice coughing to be sure your lungs are clear. If you have an incision (the cut made at the time of surgery), support your incision when coughing by placing a pillow or rolled up towels firmly against it. Once you are able to get out of bed, walk around indoors and cough well. You may stop using the incentive spirometer when instructed by your caregiver.  RISKS AND COMPLICATIONS  Take your time so you do not get dizzy or light-headed.  If you are in pain, you may need to take or ask for pain medication before doing incentive spirometry. It is harder to take a deep breath if you are having pain. AFTER USE  Rest and breathe slowly and easily.  It can be helpful to keep track of a log of your progress. Your caregiver can provide you with a simple table to help with this. If you are using the spirometer at home, follow these instructions: [place] IF:   You are having difficultly using the spirometer.  You have trouble using the spirometer as often as instructed.  Your pain medication is not giving enough relief while using the spirometer.  You develop fever of 100.5 F (38.1 C) or higher. SEEK IMMEDIATE MEDICAL CARE IF:   You cough up bloody sputum that had not been present before.  You develop fever of 102 F  (38.9 C) or greater.  You develop worsening pain at or near the incision site. MAKE SURE YOU:   Understand these instructions.  Will watch your condition.  Will get help right away if you are not doing well or get worse. Document Released: 02/01/2007 Document Revised: 12/14/2011 Document Reviewed: 04/04/2007 [facility] Patient Information 2014 [place], [place].   ________________________________________________________________________

## 2018-05-26 ENCOUNTER — Encounter (HOSPITAL_COMMUNITY): Payer: Self-pay

## 2018-05-26 ENCOUNTER — Encounter (HOSPITAL_COMMUNITY)
Admission: RE | Admit: 2018-05-26 | Discharge: 2018-05-26 | Disposition: A | Discharge status: Home / Self Care | Payer: Medicare Other | Source: Ambulatory Visit | Attending: Orthopedic Surgery | Admitting: Orthopedic Surgery

## 2018-05-26 ENCOUNTER — Other Ambulatory Visit: Payer: Self-pay

## 2018-05-26 DIAGNOSIS — Z01812 Encounter for preprocedural laboratory examination: Secondary | ICD-10-CM | POA: Insufficient documentation

## 2018-05-26 DIAGNOSIS — E119 Type 2 diabetes mellitus without complications: Secondary | ICD-10-CM | POA: Diagnosis not present

## 2018-05-26 HISTORY — DX: Dehydration: E86.0

## 2018-05-26 LAB — CBC
HCT: 33 % — ABNORMAL LOW (ref 36.0–46.0)
Hemoglobin: 11 g/dL — ABNORMAL LOW (ref 12.0–15.0)
MCH: 31.1 pg (ref 26.0–34.0)
MCHC: 33.3 g/dL (ref 30.0–36.0)
MCV: 93.2 fL (ref 78.0–100.0)
PLATELETS: 315 10*3/uL (ref 150–400)
RBC: 3.54 MIL/uL — ABNORMAL LOW (ref 3.87–5.11)
RDW: 12.7 % (ref 11.5–15.5)
WBC: 7 10*3/uL (ref 4.0–10.5)

## 2018-05-26 LAB — BASIC METABOLIC PANEL
Anion gap: 6 (ref 5–15)
BUN: 20 mg/dL (ref 8–23)
CALCIUM: 9.1 mg/dL (ref 8.9–10.3)
CO2: 25 mmol/L (ref 22–32)
CREATININE: 0.84 mg/dL (ref 0.44–1.00)
Chloride: 107 mmol/L (ref 98–111)
GFR calc non Af Amer: >60 mL/min (ref >60)
Glucose, Bld: 100 mg/dL — ABNORMAL HIGH (ref 70–99)
Potassium: 4 mmol/L (ref 3.5–5.1)
Sodium: 138 mmol/L (ref 135–145)

## 2018-05-26 LAB — SURGICAL PCR SCREEN
MRSA, PCR: NEGATIVE
Staphylococcus aureus: NEGATIVE

## 2018-05-26 LAB — GLUCOSE, CAPILLARY: GLUCOSE-CAPILLARY: 104 mg/dL — AB (ref 70–99)

## 2018-05-26 LAB — HEMOGLOBIN A1C
HEMOGLOBIN A1C: 6.2 % — AB (ref 4.8–5.6)
MEAN PLASMA GLUCOSE: 131.24 mg/dL

## 2018-06-01 ENCOUNTER — Other Ambulatory Visit: Payer: Self-pay

## 2018-06-01 ENCOUNTER — Ambulatory Visit (HOSPITAL_COMMUNITY): Payer: Medicare Other | Admitting: Anesthesiology

## 2018-06-01 ENCOUNTER — Encounter (HOSPITAL_COMMUNITY)
Admission: RE | Disposition: A | Discharge status: Home / Self Care | Payer: Self-pay | Source: Ambulatory Visit | Attending: Orthopedic Surgery

## 2018-06-01 ENCOUNTER — Ambulatory Visit (HOSPITAL_COMMUNITY)
Admission: RE | Admit: 2018-06-01 | Discharge: 2018-06-01 | Disposition: A | Discharge status: Home / Self Care | Payer: Medicare Other | Source: Ambulatory Visit | Attending: Orthopedic Surgery | Admitting: Orthopedic Surgery

## 2018-06-01 ENCOUNTER — Encounter (HOSPITAL_COMMUNITY): Payer: Self-pay | Admitting: *Deleted

## 2018-06-01 DIAGNOSIS — Z471 Aftercare following joint replacement surgery: Secondary | ICD-10-CM | POA: Diagnosis not present

## 2018-06-01 DIAGNOSIS — Z79899 Other long term (current) drug therapy: Secondary | ICD-10-CM | POA: Diagnosis not present

## 2018-06-01 DIAGNOSIS — Z7901 Long term (current) use of anticoagulants: Secondary | ICD-10-CM | POA: Diagnosis not present

## 2018-06-01 DIAGNOSIS — E119 Type 2 diabetes mellitus without complications: Secondary | ICD-10-CM | POA: Insufficient documentation

## 2018-06-01 DIAGNOSIS — Z853 Personal history of malignant neoplasm of breast: Secondary | ICD-10-CM | POA: Insufficient documentation

## 2018-06-01 DIAGNOSIS — R0600 Dyspnea, unspecified: Secondary | ICD-10-CM | POA: Insufficient documentation

## 2018-06-01 DIAGNOSIS — I447 Left bundle-branch block, unspecified: Secondary | ICD-10-CM | POA: Diagnosis not present

## 2018-06-01 DIAGNOSIS — M24661 Ankylosis, right knee: Secondary | ICD-10-CM | POA: Diagnosis not present

## 2018-06-01 DIAGNOSIS — T8482XS Fibrosis due to internal orthopedic prosthetic devices, implants and grafts, sequela: Secondary | ICD-10-CM | POA: Diagnosis present

## 2018-06-01 DIAGNOSIS — E7800 Pure hypercholesterolemia, unspecified: Secondary | ICD-10-CM | POA: Diagnosis not present

## 2018-06-01 DIAGNOSIS — X58XXXA Exposure to other specified factors, initial encounter: Secondary | ICD-10-CM | POA: Insufficient documentation

## 2018-06-01 DIAGNOSIS — K449 Diaphragmatic hernia without obstruction or gangrene: Secondary | ICD-10-CM | POA: Insufficient documentation

## 2018-06-01 DIAGNOSIS — J45909 Unspecified asthma, uncomplicated: Secondary | ICD-10-CM | POA: Diagnosis not present

## 2018-06-01 DIAGNOSIS — K219 Gastro-esophageal reflux disease without esophagitis: Secondary | ICD-10-CM | POA: Insufficient documentation

## 2018-06-01 DIAGNOSIS — T8482XA Fibrosis due to internal orthopedic prosthetic devices, implants and grafts, initial encounter: Secondary | ICD-10-CM

## 2018-06-01 DIAGNOSIS — M1710 Unilateral primary osteoarthritis, unspecified knee: Secondary | ICD-10-CM

## 2018-06-01 DIAGNOSIS — I428 Other cardiomyopathies: Secondary | ICD-10-CM | POA: Insufficient documentation

## 2018-06-01 DIAGNOSIS — Z86711 Personal history of pulmonary embolism: Secondary | ICD-10-CM | POA: Insufficient documentation

## 2018-06-01 DIAGNOSIS — I10 Essential (primary) hypertension: Secondary | ICD-10-CM | POA: Insufficient documentation

## 2018-06-01 DIAGNOSIS — M1990 Unspecified osteoarthritis, unspecified site: Secondary | ICD-10-CM | POA: Diagnosis not present

## 2018-06-01 DIAGNOSIS — I480 Paroxysmal atrial fibrillation: Secondary | ICD-10-CM | POA: Diagnosis not present

## 2018-06-01 DIAGNOSIS — Z96651 Presence of right artificial knee joint: Secondary | ICD-10-CM | POA: Diagnosis not present

## 2018-06-01 DIAGNOSIS — T85898A Other specified complication of other internal prosthetic devices, implants and grafts, initial encounter: Secondary | ICD-10-CM | POA: Insufficient documentation

## 2018-06-01 HISTORY — PX: KNEE CLOSED REDUCTION: SHX995

## 2018-06-01 LAB — GLUCOSE, CAPILLARY
GLUCOSE-CAPILLARY: 88 mg/dL (ref 70–99)
Glucose-Capillary: 93 mg/dL (ref 70–99)

## 2018-06-01 SURGERY — MANIPULATION, KNEE, CLOSED
Anesthesia: General | Site: Knee | Laterality: Right

## 2018-06-01 MED ORDER — PROPOFOL 10 MG/ML IV BOLUS
INTRAVENOUS | Status: DC | PRN
  Administered 2018-06-01: 150 mg via INTRAVENOUS

## 2018-06-01 MED ORDER — ACETAMINOPHEN 10 MG/ML IV SOLN
INTRAVENOUS | Status: AC
  Filled 2018-06-01: qty 100

## 2018-06-01 MED ORDER — ACETAMINOPHEN 10 MG/ML IV SOLN
1000.0000 mg | Freq: Once | INTRAVENOUS | Status: AC
  Administered 2018-06-01: 1000 mg via INTRAVENOUS

## 2018-06-01 MED ORDER — PROMETHAZINE HCL 25 MG/ML IJ SOLN: 6.2500 mg | INTRAMUSCULAR | Status: DC | PRN

## 2018-06-01 MED ORDER — LIDOCAINE 2% (20 MG/ML) 5 ML SYRINGE
INTRAMUSCULAR | Status: DC | PRN
  Administered 2018-06-01: 100 mg via INTRAVENOUS

## 2018-06-01 MED ORDER — LIDOCAINE 2% (20 MG/ML) 5 ML SYRINGE
INTRAMUSCULAR | Status: AC
  Filled 2018-06-01: qty 5

## 2018-06-01 MED ORDER — ONDANSETRON HCL 4 MG/2ML IJ SOLN
INTRAMUSCULAR | Status: DC | PRN
  Administered 2018-06-01: 4 mg via INTRAVENOUS

## 2018-06-01 MED ORDER — KETOROLAC TROMETHAMINE 30 MG/ML IJ SOLN
15.0000 mg | Freq: Once | INTRAMUSCULAR | Status: AC | PRN
  Administered 2018-06-01: 15 mg via INTRAVENOUS

## 2018-06-01 MED ORDER — CHLORHEXIDINE GLUCONATE 4 % EX LIQD: 60.0000 mL | Freq: Once | CUTANEOUS | Status: DC

## 2018-06-01 MED ORDER — POVIDONE-IODINE 10 % EX SWAB: 2.0000 "application " | Freq: Once | CUTANEOUS | Status: DC

## 2018-06-01 MED ORDER — FENTANYL CITRATE (PF) 100 MCG/2ML IJ SOLN: 25.0000 ug | INTRAMUSCULAR | Status: DC | PRN

## 2018-06-01 MED ORDER — SCOPOLAMINE 1 MG/3DAYS TD PT72: 1.0000 | MEDICATED_PATCH | TRANSDERMAL | Status: DC

## 2018-06-01 MED ORDER — SODIUM CHLORIDE 0.9 % IV SOLN: INTRAVENOUS | Status: DC

## 2018-06-01 MED ORDER — KETOROLAC TROMETHAMINE 30 MG/ML IJ SOLN
INTRAMUSCULAR | Status: AC
  Filled 2018-06-01: qty 1

## 2018-06-01 MED ORDER — PROPOFOL 10 MG/ML IV BOLUS
INTRAVENOUS | Status: AC
  Filled 2018-06-01: qty 20

## 2018-06-01 MED ORDER — ONDANSETRON HCL 4 MG/2ML IJ SOLN
INTRAMUSCULAR | Status: AC
  Filled 2018-06-01: qty 2

## 2018-06-01 MED ORDER — LACTATED RINGERS IV SOLN
INTRAVENOUS | Status: DC
  Administered 2018-06-01: 14:00:00 via INTRAVENOUS

## 2018-06-01 SURGICAL SUPPLY — 14 items
BANDAGE ADH SHEER 1  50/CT (GAUZE/BANDAGES/DRESSINGS) IMPLANT
COVER SURGICAL LIGHT HANDLE (MISCELLANEOUS) IMPLANT
GAUZE SPONGE 4X4 12PLY STRL (GAUZE/BANDAGES/DRESSINGS) IMPLANT
GLOVE BIO SURGEON STRL SZ8 (GLOVE) IMPLANT
GLOVE BIOGEL PI IND STRL 7.0 (GLOVE) IMPLANT
GLOVE BIOGEL PI IND STRL 8 (GLOVE) IMPLANT
GLOVE BIOGEL PI INDICATOR 7.0 (GLOVE)
GLOVE BIOGEL PI INDICATOR 8 (GLOVE)
GLOVE SURG SS PI 7.0 STRL IVOR (GLOVE) IMPLANT
GOWN STRL REUS W/TWL LRG LVL3 (GOWN DISPOSABLE) IMPLANT
NDL SAFETY ECLIPSE 18X1.5 (NEEDLE) IMPLANT
NEEDLE HYPO 18GX1.5 SHARP (NEEDLE)
SWABSTICK PVP IODINE (MISCELLANEOUS) IMPLANT
SYR CONTROL 10ML LL (SYRINGE) IMPLANT

## 2018-06-01 NOTE — Anesthesia Postprocedure Evaluation (Signed)
Anesthesia Post Note  Patient: [name]  Procedure(s) Performed: CLOSED MANIPULATION RIGHT KNEE (Right Knee)     Patient location during evaluation: PACU Anesthesia Type: General Level of consciousness: awake and alert Pain management: pain level controlled Vital Signs Assessment: post-procedure vital signs reviewed and stable Respiratory status: spontaneous breathing, nonlabored ventilation, respiratory function stable and patient connected to nasal cannula oxygen Cardiovascular status: blood pressure returned to baseline and stable Postop Assessment: no apparent nausea or vomiting Anesthetic complications: no    Last Vitals:  Vitals:   06/01/18 1645 06/01/18 1700  BP: 123/72 135/74  Pulse: 89 84  Resp: (!) 22 19  Temp: 36.9 C   SpO2: 100% 97%    Last Pain:  Vitals:   06/01/18 1700  TempSrc:   PainSc: 8                  Jnaya Butrick S

## 2018-06-01 NOTE — Transfer of Care (Signed)
Immediate Anesthesia Transfer of Care Note  Patient: MISTINA COATNEY  Procedure(s) Performed: CLOSED MANIPULATION RIGHT KNEE (Right Knee)  Patient Location: PACU  Anesthesia Type:General  Level of Consciousness: sedated  Airway & Oxygen Therapy: Patient Spontanous Breathing and Patient connected to face mask oxygen  Post-op Assessment: Report given to RN and Post -op Vital signs reviewed and stable  Post vital signs: Reviewed and stable  Last Vitals:  Vitals Value Taken Time  BP    Temp    Pulse    Resp 22 06/01/2018  4:44 PM  SpO2    Vitals shown include unvalidated device data.  Last Pain:  Vitals:   06/01/18 1310  TempSrc: Oral      Patients Stated Pain Goal: 2 (15/05/69 7948)  Complications: No apparent anesthesia complications

## 2018-06-01 NOTE — Op Note (Signed)
  OPERATIVE REPORT   PREOPERATIVE DIAGNOSIS: Arthrofibrosis, Right  knee.   POSTOPERATIVE DIAGNOSIS: Arthrofibrosis, Right knee.   PROCEDURE:  Right  knee closed manipulation.   SURGEON: Oaklie Durrett, MD   ASSISTANT: None.   ANESTHESIA: General.   COMPLICATIONS: None.   CONDITION: Stable to Recovery.   Pre-manipulation range of motion is 5-90.  Post-manipulation range of  Motion is 0-130  PROCEDURE IN DETAIL: After successful administration of general  anesthetic, exam under anesthesia was performed showing range of motion  5-90 degrees. I then placed my chest against the proximal tibia,  flexing the knee with audible lysis of adhesions. I was easily able to  get the knee flexed to 130  degrees. I then put the knee back in extension and with some  patellar manipulation and gentle pressure got to full  Extension.The patient was subsequently awakened and transported to Recovery in  stable condition.    

## 2018-06-01 NOTE — H&P (Signed)
CC- [name] is a 72 y.o. female who presents with right knee pain.  HPI- . Knee Pain: Patient presents with stiffness involving the  right knee. Onset of the symptoms was several months ago. Inciting event: none known, She had a right Total Knee Arthroplasty on 03/07/18 and has had difficulty obtaining adequate range of motion in her knee. She has had physical therapy throughout her post-operative course but has not acheived flexion over 95 degrees and has a firm endpoint. SHe presents today for closed manipulation.  Past Medical History:  Diagnosis Date  . Allergy history unknown   . Arthritis    oa  . Asthma    takes acolate for  . Breast cancer ([place]) 1999; 2008   hx of bilateral breast cancer  . Breast cancer, left breast ([place]) 10/27/2012   3.2 cm ER positive, Her-2 negative, 1 node positive S/P mastectomy/TTRAM reconstruction 4/08  . Breast cancer, right breast ([place]) 10/27/2012   3.2 cm ER/PR positive, 1 node positive, Her-2 negative Rx w mastectomy, AC -T chemo, followed by aromatase inhibitor x 5 years dx 4/08  . Cancer [facility])    breast CA  . Dehydration after exertion    after she went berry picking in the summer heat . see ED visit   . Diabetes ([place])   . DM2 (diabetes mellitus, type 2) ([place])    on welchol for  . Dyspnea    with exertion  . Elevated cholesterol   . GERD (gastroesophageal reflux disease)   . Hiatal hernia   . Hypertension   . LBBB (left bundle branch block) 10/27/2012  . Leg cramps   . NICM (nonischemic cardiomyopathy) ([place])    a. Echo:  06/14/12: Poor endocardial definition, possible septal HK, EF 50-55%, normal wall motion, mild LAE ;  b.  [facility] 9/13:  normal cors, EF 35-40%,  c. Echo 7/14: EF 50-55%, mild LAE  . Other fatigue 01/01/2015  . Pulmonary embolus ([place]) 2001   after tram flap surgery    Past Surgical History:  Procedure Laterality Date  . DILATION AND CURETTAGE OF UTERUS   53yr ago   x 2  . KNEE SURGERY  2006   left torn cartilage repair   . LEFT HEART CATHETERIZATION WITH CORONARY ANGIOGRAM N/A 06/15/2012   Procedure: LEFT HEART CATHETERIZATION WITH CORONARY ANGIOGRAM;  Surgeon: MWellington Hampshire MD;  [facility];  Service: Cardiovascular;  Laterality: N/A;  . MASTECTOMY     bilateral  . RECONSTRUCTION BREAST W/ TRAM FLAP  2001  . TOTAL KNEE ARTHROPLASTY Right 03/07/2018   Procedure: RIGHT TOTAL KNEE ARTHROPLASTY;  Surgeon: AGaynelle Arabian MD;  Location: [facility] ORS;  Service: Orthopedics;  Laterality: Right;    Prior to Admission medications   Medication Sig Start Date End Date Taking? Authorizing Provider  acetaminophen (TYLENOL) 500 MG tablet Take 500 mg by mouth daily as needed for mild pain.   Yes [provider]  atorvastatin (LIPITOR) 20 MG tablet TAKE 2 TABLETS (40 MG TOTAL) BY MOUTH EVERY EVENING. Patient taking differently: Take 40 mg by mouth daily at 6 PM.  10/04/17  Yes Pickard, WCammie Mcgee MD  carvedilol (COREG) 12.5 MG tablet TAKE 1 TABLET IN THE MORNING AND 1.5 TABLETS IN THE EVENING Patient taking differently: Take 12.5-18.75 mg by mouth See admin instructions. Takes 1 tablet in the AM and 1.5 tablet in the PM. 02/24/18  Yes Bensimhon, DShaune Pascal MD  colesevelam (WELCHOL) 625 MG tablet TAKE 3 TABLETS (1,875 MG  TOTAL) BY MOUTH 2 (TWO) TIMES DAILY WITH A MEAL. Patient taking differently: Take 1,875 mg by mouth 2 (two) times daily with a meal.  01/31/18  Yes Pickard, Cammie Mcgee, MD  DEXILANT 60 MG capsule TAKE ONE CAPSULE BY MOUTH EVERY DAY Patient taking differently: Take 60 mg by mouth daily.  09/03/17  Yes Pickard, Cammie Mcgee, MD  ELIQUIS 5 MG TABS tablet TAKE 1 TABLET BY MOUTH TWICE A DAY Patient taking differently: Take 5 mg by mouth 2 (two) times daily.  02/04/18  Yes Larey Dresser, MD  fluticasone (FLONASE) 50 MCG/ACT nasal spray USE 2 SPRAYS IN EACH NOSTRIL DAILY FOR 3 MONTHS Patient taking differently: Place 2 sprays into both nostrils daily as needed (for allergies/congestion.). USE 2 SPRAYS IN EACH  NOSTRIL DAILY FOR 3 MONTHS 12/10/17  Yes Susy Frizzle, MD  losartan (COZAAR) 100 MG tablet TAKE 1 TABLET BY MOUTH EVERY DAY 04/25/18  Yes Shirley Friar, PA-C  spironolactone (ALDACTONE) 25 MG tablet TAKE 1 TABLET BY MOUTH EVERY DAY Patient taking differently: Take 25 mg by mouth daily.  01/27/18  Yes Bensimhon, Shaune Pascal, MD  zafirlukast (ACCOLATE) 20 MG tablet TAKE 1 TABLET (20 MG TOTAL) BY MOUTH 2 (TWO) TIMES DAILY. Patient taking differently: Take 20 mg by mouth 2 (two) times daily before a meal.  04/25/18  Yes Pickard, Cammie Mcgee, MD  Blood Glucose Monitoring Suppl (ONE TOUCH ULTRA 2) W/DEVICE KIT Check FBS qam - Dx:250.00 and she will need lancets (1 box) , strips (1 bottle = 100) and controls also thanks 09/01/13   Susy Frizzle, MD  glucose blood test strip accu check comfort curve Checks FBS qam 10/11/15   Susy Frizzle, MD   KNEE EXAM antalgic gait, no warmth or effusion, reduced range of motion (5-90), collateral ligaments intact  Physical Examination: General appearance - alert, well appearing, and in no distress Mental status - alert, oriented to person, place, and time Chest - clear to auscultation, no wheezes, rales or rhonchi, symmetric air entry Heart - normal rate, regular rhythm, normal S1, S2, no murmurs, rubs, clicks or gallops Abdomen - soft, nontender, nondistended, no masses or organomegaly Neurological - alert, oriented, normal speech, no focal findings or movement disorder noted   Asessment/Plan--- Right knee arthrofibrosis- - Plan right knee closed manipulation. Procedure risks and potential comps discussed with patient who elects to proceed. Goals are decreased pain and increased function with a high likelihood of achieving both

## 2018-06-01 NOTE — Anesthesia Preprocedure Evaluation (Signed)
Anesthesia Evaluation  Patient identified by MRN, date of birth, ID band Patient awake    Reviewed: Allergy & Precautions, NPO status , Patient's Chart, lab work & pertinent test results  Airway Mallampati: II  TM Distance: >3 FB Neck ROM: Full    Dental no notable dental hx.    Pulmonary asthma ,    Pulmonary exam normal breath sounds clear to auscultation       Cardiovascular hypertension, Normal cardiovascular exam Rhythm:Regular Rate:Normal  Left ventricle: The cavity size was normal. Wall thickness was   increased in a pattern of mild LVH. Systolic function was normal.   The estimated ejection fraction was in the range of 50% to 55%.   Wall motion was normal; there were no regional wall motion   abnormalities. Doppler parameters are consistent with abnormal   left ventricular relaxation (grade 1 diastolic dysfunction). - Ventricular septum: Septal motion showed abnormal function and   dyssynergy. These changes are consistent with a left bundle   branch block. - Mitral valve: Mildly calcified annulus.   Neuro/Psych negative neurological ROS  negative psych ROS   GI/Hepatic Neg liver ROS, GERD  ,  Endo/Other  diabetes  Renal/GU negative Renal ROS  negative genitourinary   Musculoskeletal negative musculoskeletal ROS (+)   Abdominal   Peds negative pediatric ROS (+)  Hematology negative hematology ROS (+)   Anesthesia Other Findings   Reproductive/Obstetrics negative OB ROS                             Anesthesia Physical Anesthesia Plan  ASA: III  Anesthesia Plan: General   Post-op Pain Management:    Induction: Intravenous  PONV Risk Score and Plan: 3 and Ondansetron, Dexamethasone and Treatment may vary due to age or medical condition  Airway Management Planned: LMA  Additional Equipment:   Intra-op Plan:   Post-operative Plan: Extubation in OR  Informed Consent: I  have reviewed the patients History and Physical, chart, labs and discussed the procedure including the risks, benefits and alternatives for the proposed anesthesia with the patient or authorized representative who has indicated his/her understanding and acceptance.   Dental advisory given  Plan Discussed with: CRNA and Surgeon  Anesthesia Plan Comments:         Anesthesia Quick Evaluation

## 2018-06-01 NOTE — Discharge Instructions (Signed)
Resume your physical therapy tomorrow  Resume activities as tolerated  I was able to bend your knee 130 degrees

## 2018-06-02 ENCOUNTER — Encounter (HOSPITAL_COMMUNITY): Payer: Self-pay | Admitting: Orthopedic Surgery

## 2018-06-02 DIAGNOSIS — M25561 Pain in right knee: Secondary | ICD-10-CM | POA: Diagnosis not present

## 2018-06-03 DIAGNOSIS — M25561 Pain in right knee: Secondary | ICD-10-CM | POA: Diagnosis not present

## 2018-06-07 DIAGNOSIS — M25561 Pain in right knee: Secondary | ICD-10-CM | POA: Diagnosis not present

## 2018-06-09 DIAGNOSIS — M25561 Pain in right knee: Secondary | ICD-10-CM | POA: Diagnosis not present

## 2018-06-10 DIAGNOSIS — M25561 Pain in right knee: Secondary | ICD-10-CM | POA: Diagnosis not present

## 2018-06-13 DIAGNOSIS — M25561 Pain in right knee: Secondary | ICD-10-CM | POA: Diagnosis not present

## 2018-06-15 DIAGNOSIS — M25561 Pain in right knee: Secondary | ICD-10-CM | POA: Diagnosis not present

## 2018-06-16 DIAGNOSIS — M1712 Unilateral primary osteoarthritis, left knee: Secondary | ICD-10-CM | POA: Diagnosis not present

## 2018-06-16 DIAGNOSIS — Z4789 Encounter for other orthopedic aftercare: Secondary | ICD-10-CM | POA: Diagnosis not present

## 2018-06-16 DIAGNOSIS — Z96651 Presence of right artificial knee joint: Secondary | ICD-10-CM | POA: Diagnosis not present

## 2018-06-17 DIAGNOSIS — M25561 Pain in right knee: Secondary | ICD-10-CM | POA: Diagnosis not present

## 2018-06-20 DIAGNOSIS — M25561 Pain in right knee: Secondary | ICD-10-CM | POA: Diagnosis not present

## 2018-06-22 DIAGNOSIS — M25561 Pain in right knee: Secondary | ICD-10-CM | POA: Diagnosis not present

## 2018-06-24 ENCOUNTER — Other Ambulatory Visit: Payer: Self-pay | Admitting: Family Medicine

## 2018-06-24 DIAGNOSIS — M25561 Pain in right knee: Secondary | ICD-10-CM | POA: Diagnosis not present

## 2018-06-27 DIAGNOSIS — M25561 Pain in right knee: Secondary | ICD-10-CM | POA: Diagnosis not present

## 2018-06-29 DIAGNOSIS — M25561 Pain in right knee: Secondary | ICD-10-CM | POA: Diagnosis not present

## 2018-07-01 DIAGNOSIS — M25561 Pain in right knee: Secondary | ICD-10-CM | POA: Diagnosis not present

## 2018-07-01 DIAGNOSIS — Z23 Encounter for immunization: Secondary | ICD-10-CM | POA: Diagnosis not present

## 2018-07-04 ENCOUNTER — Ambulatory Visit (HOSPITAL_COMMUNITY)
Admission: RE | Admit: 2018-07-04 | Discharge: 2018-07-04 | Disposition: A | Discharge status: Home / Self Care | Payer: Medicare Other | Source: Ambulatory Visit | Attending: Orthopedic Surgery | Admitting: Orthopedic Surgery

## 2018-07-04 ENCOUNTER — Other Ambulatory Visit (HOSPITAL_COMMUNITY): Payer: Self-pay | Admitting: Orthopedic Surgery

## 2018-07-04 DIAGNOSIS — M79604 Pain in right leg: Secondary | ICD-10-CM | POA: Insufficient documentation

## 2018-07-04 DIAGNOSIS — M25561 Pain in right knee: Secondary | ICD-10-CM | POA: Diagnosis not present

## 2018-07-04 DIAGNOSIS — M7989 Other specified soft tissue disorders: Secondary | ICD-10-CM

## 2018-07-04 NOTE — Progress Notes (Signed)
Right lower extremity venous duplex has been completed. Negative for DVT. Results were faxed to Dr. Wynelle Link.  07/04/18 4:58 PM [name] RVT

## 2018-07-06 DIAGNOSIS — R0602 Shortness of breath: Secondary | ICD-10-CM | POA: Diagnosis not present

## 2018-07-06 DIAGNOSIS — I959 Hypotension, unspecified: Secondary | ICD-10-CM | POA: Diagnosis not present

## 2018-07-06 DIAGNOSIS — J45909 Unspecified asthma, uncomplicated: Secondary | ICD-10-CM | POA: Diagnosis not present

## 2018-07-06 DIAGNOSIS — I509 Heart failure, unspecified: Secondary | ICD-10-CM | POA: Diagnosis not present

## 2018-07-06 DIAGNOSIS — E119 Type 2 diabetes mellitus without complications: Secondary | ICD-10-CM | POA: Diagnosis not present

## 2018-07-06 DIAGNOSIS — R079 Chest pain, unspecified: Secondary | ICD-10-CM | POA: Diagnosis not present

## 2018-07-06 DIAGNOSIS — E785 Hyperlipidemia, unspecified: Secondary | ICD-10-CM | POA: Diagnosis not present

## 2018-07-06 DIAGNOSIS — H538 Other visual disturbances: Secondary | ICD-10-CM | POA: Diagnosis not present

## 2018-07-06 DIAGNOSIS — R42 Dizziness and giddiness: Secondary | ICD-10-CM | POA: Diagnosis not present

## 2018-07-20 ENCOUNTER — Other Ambulatory Visit (HOSPITAL_COMMUNITY): Payer: Self-pay | Admitting: Student

## 2018-07-21 DIAGNOSIS — M12861 Other specific arthropathies, not elsewhere classified, right knee: Secondary | ICD-10-CM | POA: Diagnosis not present

## 2018-07-21 DIAGNOSIS — Z96651 Presence of right artificial knee joint: Secondary | ICD-10-CM | POA: Diagnosis not present

## 2018-07-22 DIAGNOSIS — M25561 Pain in right knee: Secondary | ICD-10-CM | POA: Diagnosis not present

## 2018-07-26 ENCOUNTER — Ambulatory Visit (INDEPENDENT_AMBULATORY_CARE_PROVIDER_SITE_OTHER): Payer: Medicare Other | Admitting: Family Medicine

## 2018-07-26 ENCOUNTER — Encounter: Payer: Self-pay | Admitting: Family Medicine

## 2018-07-26 VITALS — BP 110/56 | HR 80 | Temp 98.0°F | Resp 16 | Ht 62.0 in | Wt 173.0 lb

## 2018-07-26 DIAGNOSIS — R55 Syncope and collapse: Secondary | ICD-10-CM | POA: Diagnosis not present

## 2018-07-26 NOTE — Progress Notes (Signed)
Subjective:    Patient ID: [name], female    DOB: 03/18/1946, 72 y.o.   MRN: [id number]  HPI  04/25/18 Patient presents today for ER follow-up.  She had a right knee replacement in June.  She is done relatively well postoperatively and is now almost 7 weeks postoperative.  Earlier this month, she was picking blueberries.  She sat down and immediately started feeling lightheaded as though she was unable to catch her breath.  They called EMS and she was taken to the emergency room.  In the emergency room, chest x-ray was clear.  CBC was significant for mild anemia with a hemoglobin of 10.7.  Patient's baseline is around 11.5.  CMP was unremarkable.  She was diagnosed with dehydration.  She was feeling better by the time she got to the emergency room.  She is here today for follow-up.  She states that ever since her surgery, she feels extremely weak and fatigued.  She also reports feeling no stamina.  She denies any further episodes of shortness of breath.  She denies any chest pain, pleurisy, hemoptysis, cough.  She has no pitting edema in her legs.  She is on Eliquis 5 mg twice daily and therefore I believe a DVT/PE is unlikely.  At the present time she is mainly complaining of fatigue and feeling weak and tired.  At that time, my plan was:  I believe the patient's fatigue, weakness, poor stamina could be relative hypotension.  Rarely discontinue Lasix and losartan.  Encouraged the patient to drink more fluids over the next week.  Reassess in 1 week.  Likely resume losartan at that point due to her underlying cardiomyopathy.  Resume Lasix if there is evidence that the patient is retaining fluid.   05/02/18 Patient held the Lasix.  Her blood pressure has improved throughout the week.  She has been checking at home and finding it to be between 110 and 120/60-70.  I personally rechecked her blood pressure here and found it to be 110/60 in both arms.  She states she feels much better holding the  Lasix.  She denies any orthopnea or shortness of breath or paroxysmal nocturnal dyspnea.  She never felt comfortable discontinuing losartan and therefore she is still taking the medication.  At that time, my plan was: My nurse's blood pressure is incorrect.  I personally rechecked her blood pressure and found it to be within normal range at 110/60.  Therefore I recommended the patient continue to refrain from using Lasix.  She can stay on losartan as she seems to be tolerating this medication.  I would like her to stay on her beta-blocker and ARB given her history of nonischemic cardiomyopathy.    07/26/18 Patient recently flew to [place] [facility].  It was approximately 1 hour plane flight with a break where she stretched her legs and then another 1 hour plane flight.  Afterwards, she was sitting in a mall awaiting her husband.  While sitting there, she instantly felt hot like she was breaking out in a sweat all over her face and head.  She suddenly became short of breath.  She states that her vision changed and that there was lights around people and halos around objects.  Someone came to help her.  She took a dose of her albuterol.  911 was called.  By the time the ambulance arrived, her shortness of breath had improved.  She denies any chest pain during the event.  She denies any palpitations or  tachycardia.  She was taken to the hospital.  Their cardiac enzymes were cycled twice and found to be negative.  A chest x-ray was normal.  Otherwise there was no other work-up completed per the patient's history.  Review of her records show an echocardiogram earlier this year that shows an improved ejection fraction of 50 to 55%.  She had a stress test in 2016 that showed no ischemia.  This is the second time this event has occurred Past Medical History:  Diagnosis Date  . Allergy history unknown   . Arthritis    oa  . Asthma    takes acolate for  . Breast cancer ([place]) 1999; 2008   hx of bilateral breast cancer   . Breast cancer, left breast ([place]) 10/27/2012   3.2 cm ER positive, Her-2 negative, 1 node positive S/P mastectomy/TTRAM reconstruction 4/08  . Breast cancer, right breast ([place]) 10/27/2012   3.2 cm ER/PR positive, 1 node positive, Her-2 negative Rx w mastectomy, AC -T chemo, followed by aromatase inhibitor x 5 years dx 4/08  . Cancer [facility])    breast CA  . Dehydration after exertion    after she went berry picking in the summer heat . see ED visit   . Diabetes ([place])   . DM2 (diabetes mellitus, type 2) ([place])    on welchol for  . Dyspnea    with exertion  . Elevated cholesterol   . GERD (gastroesophageal reflux disease)   . Hiatal hernia   . Hypertension   . LBBB (left bundle branch block) 10/27/2012  . Leg cramps   . NICM (nonischemic cardiomyopathy) ([place])    a. Echo:  06/14/12: Poor endocardial definition, possible septal HK, EF 50-55%, normal wall motion, mild LAE ;  b.  [facility] 9/13:  normal cors, EF 35-40%,  c. Echo 7/14: EF 50-55%, mild LAE  . Other fatigue 01/01/2015  . Pulmonary embolus ([place]) 2001   after tram flap surgery   Past Surgical History:  Procedure Laterality Date  . DILATION AND CURETTAGE OF UTERUS   60yr ago   x 2  . KNEE CLOSED REDUCTION Right 06/01/2018   Procedure: CLOSED MANIPULATION RIGHT KNEE;  Surgeon: AGaynelle Arabian MD;  Location: [facility];  Service: Orthopedics;  Laterality: Right;  . KNEE SURGERY  2006   left torn cartilage repair  . LEFT HEART CATHETERIZATION WITH CORONARY ANGIOGRAM N/A 06/15/2012   Procedure: LEFT HEART CATHETERIZATION WITH CORONARY ANGIOGRAM;  Surgeon: MWellington Hampshire MD;  [facility];  Service: Cardiovascular;  Laterality: N/A;  . MASTECTOMY     bilateral  . RECONSTRUCTION BREAST W/ TRAM FLAP  2001  . TOTAL KNEE ARTHROPLASTY Right 03/07/2018   Procedure: RIGHT TOTAL KNEE ARTHROPLASTY;  Surgeon: AGaynelle Arabian MD;  [facility]: [facility];  Service: Orthopedics;  Laterality: Right;   Current Outpatient Medications on File Prior to  Visit  Medication Sig Dispense Refill  . acetaminophen (TYLENOL) 500 MG tablet Take 500 mg by mouth daily as needed for mild pain.    .Marland Kitchenatorvastatin (LIPITOR) 20 MG tablet TAKE 2 TABLETS (40 MG TOTAL) BY MOUTH EVERY EVENING. 180 tablet 2  . Blood Glucose Monitoring Suppl (ONE TOUCH ULTRA 2) W/DEVICE KIT Check FBS qam - Dx:250.00 and she will need lancets (1 box) , strips (1 bottle = 100) and controls also thanks 1 each 0  . carvedilol (COREG) 12.5 MG tablet TAKE 1 TABLET IN THE MORNING AND 1.5 TABLETS IN THE EVENING (Patient taking differently: Take 12.5-18.75 mg  by mouth See admin instructions. Takes 1 tablet in the AM and 1.5 tablet in the PM.) 210 tablet 3  . colesevelam (WELCHOL) 625 MG tablet TAKE 3 TABLETS (1,875 MG TOTAL) BY MOUTH 2 (TWO) TIMES DAILY WITH A MEAL. (Patient taking differently: Take 1,875 mg by mouth 2 (two) times daily with a meal. ) 540 tablet 3  . DEXILANT 60 MG capsule TAKE ONE CAPSULE BY MOUTH EVERY DAY (Patient taking differently: Take 60 mg by mouth daily. ) 90 capsule 3  . ELIQUIS 5 MG TABS tablet TAKE 1 TABLET BY MOUTH TWICE A DAY (Patient taking differently: Take 5 mg by mouth 2 (two) times daily. ) 180 tablet 3  . fluticasone (FLONASE) 50 MCG/ACT nasal spray USE 2 SPRAYS IN EACH NOSTRIL DAILY FOR 3 MONTHS (Patient taking differently: Place 2 sprays into both nostrils daily as needed (for allergies/congestion.). USE 2 SPRAYS IN EACH NOSTRIL DAILY FOR 3 MONTHS) 48 g 3  . glucose blood test strip accu check comfort curve Checks FBS qam 100 each 5  . losartan (COZAAR) 100 MG tablet TAKE 1 TABLET BY MOUTH EVERY DAY 90 tablet 1  . spironolactone (ALDACTONE) 25 MG tablet TAKE 1 TABLET BY MOUTH EVERY DAY (Patient taking differently: Take 25 mg by mouth daily. ) 90 tablet 3  . zafirlukast (ACCOLATE) 20 MG tablet TAKE 1 TABLET (20 MG TOTAL) BY MOUTH 2 (TWO) TIMES DAILY. (Patient taking differently: Take 20 mg by mouth 2 (two) times daily before a meal. ) 180 tablet 4   No current  facility-administered medications on file prior to visit.    Allergies  Allergen Reactions  . Adhesive [Tape] Other (See Comments)    Skin turns red   . Diphenhydramine Other (See Comments)    Pt feels wired, hyperactive  . Vicodin [Hydrocodone-Acetaminophen] Nausea And Vomiting   Social History   Socioeconomic History  . Marital status: Married    Spouse name: Not on file  . Number of children: 2  . Years of education: Not on file  . Highest education level: Not on file  Occupational History  . 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 Financial resource strain: Not on file  . Food insecurity:    Worry: Not on file    Inability: Not on file  . Transportation needs:    Medical: Not on file    Non-medical: Not on file  Tobacco Use  . Smoking status: Never Smoker  . Smokeless tobacco: Never Used  Substance and Sexual Activity  . Alcohol use: No  . Drug use: No  . Sexual activity: Never  Lifestyle  . Physical activity:    Days per week: Not on file    Minutes per session: Not on file  . Stress: Not on file  Relationships  . Social connections:    Talks on phone: Not on file    Gets together: Not on file    Attends religious service: Not on file    Active member of club or organization: Not on file    Attends meetings of clubs or organizations: Not on file    Relationship status: Not on file  . Intimate partner violence:    Fear of current or ex partner: Not on file    Emotionally abused: Not on file    Physically abused: Not on file    Forced sexual activity: Not on file  Other Topics Concern  .  Not on file  Social History Narrative  . Not on file      Review of Systems  All other systems reviewed and are negative.      Objective:   Physical Exam  Constitutional: She appears well-developed and well-nourished. No distress.  Cardiovascular: Normal rate, regular rhythm and normal heart sounds.    Pulmonary/Chest: Effort normal and breath sounds normal. No stridor. No respiratory distress. She has no wheezes. She has no rales.  Abdominal: Soft. Bowel sounds are normal. She exhibits no distension and no mass. There is no tenderness. There is no guarding.  Musculoskeletal: She exhibits no edema.  Skin: She is not diaphoretic. No erythema.  Vitals reviewed.         Assessment & Plan:  Near syncope - Plan: D-dimer, quantitative (not at [facility]) I do not see that the patient has been evaluated for pulmonary embolism.  She is on Eliquis therefore the pretest probability is extremely low.  I will check a d-dimer and if negative not pursue this any further.  I believe the patient would benefit from a Holter monitor to evaluate for any potential cardiac arrhythmias as a potential cause.  I believe the patient is having vasovagal episodes with vasodilatation causing the hot flash, a relative drop in the blood pressure causing the lightheadedness and vision changes.  However I want to rule out cardiac arrhythmias.  Await the results of the d-dimer and Holter monitor.  I asked the patient if she thought this could be an anxiety attack and she states that she does not believe so.

## 2018-07-27 ENCOUNTER — Other Ambulatory Visit (HOSPITAL_COMMUNITY): Payer: Self-pay | Admitting: Interventional Radiology

## 2018-07-27 ENCOUNTER — Other Ambulatory Visit: Payer: Self-pay | Admitting: Family Medicine

## 2018-07-27 ENCOUNTER — Ambulatory Visit (HOSPITAL_COMMUNITY)
Admission: RE | Admit: 2018-07-27 | Discharge: 2018-07-27 | Disposition: A | Discharge status: Home / Self Care | Payer: Medicare Other | Source: Ambulatory Visit | Attending: Family Medicine | Admitting: Family Medicine

## 2018-07-27 ENCOUNTER — Other Ambulatory Visit: Payer: Self-pay | Admitting: *Deleted

## 2018-07-27 ENCOUNTER — Telehealth (HOSPITAL_COMMUNITY): Payer: Self-pay

## 2018-07-27 DIAGNOSIS — R7989 Other specified abnormal findings of blood chemistry: Secondary | ICD-10-CM | POA: Diagnosis not present

## 2018-07-27 DIAGNOSIS — R0602 Shortness of breath: Secondary | ICD-10-CM | POA: Insufficient documentation

## 2018-07-27 DIAGNOSIS — R918 Other nonspecific abnormal finding of lung field: Secondary | ICD-10-CM | POA: Insufficient documentation

## 2018-07-27 LAB — D-DIMER, QUANTITATIVE: D-Dimer, Quant: 2.04 mcg/mL FEU — ABNORMAL HIGH (ref <0.50)

## 2018-07-27 LAB — POCT I-STAT CREATININE: Creatinine, Ser: 0.8 mg/dL (ref 0.44–1.00)

## 2018-07-27 MED ORDER — IOPAMIDOL (ISOVUE-370) INJECTION 76%
INTRAVENOUS | Status: AC
  Filled 2018-07-27: qty 100

## 2018-07-27 MED ORDER — IOPAMIDOL (ISOVUE-370) INJECTION 76%
INTRAVENOUS | Status: AC
  Administered 2018-07-27: 53 mL
  Filled 2018-07-27: qty 100

## 2018-07-27 NOTE — Telephone Encounter (Signed)
Pt surgical clearance faxed today to [facility]

## 2018-07-27 NOTE — Progress Notes (Signed)
Please see result note from 07/27/18 where provider requested a stat CT chest

## 2018-07-28 ENCOUNTER — Telehealth: Payer: Self-pay | Admitting: Family Medicine

## 2018-07-28 NOTE — Telephone Encounter (Signed)
Pt wanting to know about her CT results.

## 2018-07-28 NOTE — Telephone Encounter (Signed)
Pt needs CT results sent to [facility] fax # [phone].

## 2018-07-29 DIAGNOSIS — M25561 Pain in right knee: Secondary | ICD-10-CM | POA: Diagnosis not present

## 2018-07-29 NOTE — Telephone Encounter (Signed)
Patient aware of results and CT faxed to [facility].

## 2018-08-01 DIAGNOSIS — M25561 Pain in right knee: Secondary | ICD-10-CM | POA: Diagnosis not present

## 2018-08-04 ENCOUNTER — Ambulatory Visit (INDEPENDENT_AMBULATORY_CARE_PROVIDER_SITE_OTHER): Payer: Medicare Other

## 2018-08-04 DIAGNOSIS — R55 Syncope and collapse: Secondary | ICD-10-CM | POA: Diagnosis not present

## 2018-08-04 DIAGNOSIS — M25561 Pain in right knee: Secondary | ICD-10-CM | POA: Diagnosis not present

## 2018-08-12 DIAGNOSIS — L989 Disorder of the skin and subcutaneous tissue, unspecified: Secondary | ICD-10-CM | POA: Diagnosis not present

## 2018-08-16 ENCOUNTER — Ambulatory Visit: Payer: Medicare Other | Admitting: Family Medicine

## 2018-09-05 ENCOUNTER — Other Ambulatory Visit: Payer: Self-pay | Admitting: Family Medicine

## 2018-09-05 ENCOUNTER — Other Ambulatory Visit: Payer: Self-pay | Admitting: Cardiology

## 2018-09-05 DIAGNOSIS — R0602 Shortness of breath: Secondary | ICD-10-CM

## 2018-09-05 DIAGNOSIS — I428 Other cardiomyopathies: Secondary | ICD-10-CM

## 2018-09-05 DIAGNOSIS — E785 Hyperlipidemia, unspecified: Secondary | ICD-10-CM

## 2018-09-07 ENCOUNTER — Other Ambulatory Visit: Payer: Self-pay | Admitting: Family Medicine

## 2018-09-07 MED ORDER — ONETOUCH LANCETS MISC: 3 refills | Status: DC

## 2018-09-07 MED ORDER — GLUCOSE BLOOD VI STRP: ORAL_STRIP | 3 refills | Status: DC

## 2018-09-07 NOTE — Telephone Encounter (Signed)
Supplies sent as requested by pharmacy

## 2018-09-12 LAB — HM DIABETES EYE EXAM

## 2018-09-15 DIAGNOSIS — M1712 Unilateral primary osteoarthritis, left knee: Secondary | ICD-10-CM | POA: Diagnosis not present

## 2018-09-15 DIAGNOSIS — M12861 Other specific arthropathies, not elsewhere classified, right knee: Secondary | ICD-10-CM | POA: Diagnosis not present

## 2018-09-21 ENCOUNTER — Telehealth: Payer: Self-pay | Admitting: Family Medicine

## 2018-09-21 MED ORDER — ACCU-CHEK AVIVA PLUS W/DEVICE KIT: PACK | 1 refills | Status: AC

## 2018-09-21 MED ORDER — ACCU-CHEK SOFT TOUCH LANCETS MISC: 3 refills | Status: AC

## 2018-09-21 MED ORDER — GLUCOSE BLOOD VI STRP: ORAL_STRIP | 3 refills | Status: DC

## 2018-09-21 NOTE — Telephone Encounter (Signed)
Received PA for one touch ultra blue test strips - per Dr. Dennard Schaumann change to covered brand.  Accu-Chek Aviva Plus meter and supplies sent to pharm.

## 2018-10-06 ENCOUNTER — Encounter: Payer: Self-pay | Admitting: *Deleted

## 2018-10-18 NOTE — H&P (Addendum)
TOTAL KNEE ADMISSION H&P  Patient is being admitted for left total knee arthroplasty.  Subjective:  Chief Complaint:left knee pain.  HPI: [name], 73 y.o. female, has a history of pain and functional disability in the left knee due to arthritis and has failed non-surgical conservative treatments for greater than 12 weeks to includecorticosteriod injections, viscosupplementation injections and activity modification.  Onset of symptoms was gradual, starting >10 years ago with gradually worsening course since that time. The patient noted prior procedures on the knee to include  arthroscopy and menisectomy on the left knee(s).  Patient currently rates pain in the left knee(s) at 8 out of 10 with activity. Patient has worsening of pain with activity and weight bearing, crepitus and joint swelling.  Patient has evidence of bone-on-bone arthritis in the medial and patellofemoral compartments by imaging studies. There is no active infection.  Patient Active Problem List   Diagnosis Date Noted  . Arthrofibrosis of total knee replacement ([place]) 03/07/2018  . Genetic testing 06/14/2015  . Paroxysmal atrial fibrillation ([place]) 02/12/2015  . Other fatigue 01/01/2015  . Cough 11/07/2012  . Asthma 11/07/2012  . LBBB (left bundle branch block) 10/27/2012  . Breast cancer, left breast ([place]) 10/27/2012  . Breast cancer, right breast ([place]) 10/27/2012  . Nonischemic cardiomyopathy ([place]) 09/21/2012  . Chest pain 07/20/2012  . DM w/o complication type II ([place]) 10/30/2011  . DYSPNEA 07/25/2009  . Hyperlipidemia 09/19/2008  . Essential hypertension 09/19/2008   Past Medical History:  Diagnosis Date  . Allergy history unknown   . Arthritis    oa  . Asthma    takes acolate for  . Breast cancer ([place]) 1999; 2008   hx of bilateral breast cancer  . Breast cancer, left breast ([place]) 10/27/2012   3.2 cm ER positive, Her-2 negative, 1 node positive S/P mastectomy/TTRAM reconstruction 4/08  . Breast cancer,  right breast ([place]) 10/27/2012   3.2 cm ER/PR positive, 1 node positive, Her-2 negative Rx w mastectomy, AC -T chemo, followed by aromatase inhibitor x 5 years dx 4/08  . Cancer [facility])    breast CA  . Dehydration after exertion    after she went berry picking in the summer heat . see ED visit   . Diabetes ([place])   . DM2 (diabetes mellitus, type 2) ([place])    on welchol for  . Dyspnea    with exertion  . Elevated cholesterol   . GERD (gastroesophageal reflux disease)   . Hiatal hernia   . Hypertension   . LBBB (left bundle branch block) 10/27/2012  . Leg cramps   . NICM (nonischemic cardiomyopathy) ([place])    a. Echo:  06/14/12: Poor endocardial definition, possible septal HK, EF 50-55%, normal wall motion, mild LAE ;  b.  [facility] 9/13:  normal cors, EF 35-40%,  c. Echo 7/14: EF 50-55%, mild LAE  . Other fatigue 01/01/2015  . Pulmonary embolus ([place]) 2001   after tram flap surgery    Past Surgical History:  Procedure Laterality Date  . DILATION AND CURETTAGE OF UTERUS   60yr ago   x 2  . KNEE CLOSED REDUCTION Right 06/01/2018   Procedure: CLOSED MANIPULATION RIGHT KNEE;  Surgeon: AGaynelle Arabian MD;  [facility];  Service: Orthopedics;  Laterality: Right;  . KNEE SURGERY  2006   left torn cartilage repair  . LEFT HEART CATHETERIZATION WITH CORONARY ANGIOGRAM N/A 06/15/2012   Procedure: LEFT HEART CATHETERIZATION WITH CORONARY ANGIOGRAM;  Surgeon: MWellington Hampshire MD;  [facility];  Service: Cardiovascular;  Laterality: N/A;  . MASTECTOMY     bilateral  . RECONSTRUCTION BREAST W/ TRAM FLAP  2001  . TOTAL KNEE ARTHROPLASTY Right 03/07/2018   Procedure: RIGHT TOTAL KNEE ARTHROPLASTY;  Surgeon: Gaynelle Arabian, MD;  [facility];  Service: Orthopedics;  Laterality: Right;    No current facility-administered medications for this encounter.    Current Outpatient Medications  Medication Sig Dispense Refill Last Dose  . acetaminophen (TYLENOL) 500 MG tablet Take 500 mg by mouth  daily as needed for mild pain.   Taking  . atorvastatin (LIPITOR) 20 MG tablet TAKE 2 TABLETS (40 MG TOTAL) BY MOUTH EVERY EVENING. (Patient taking differently: Take 40 mg by mouth every evening. ) 180 tablet 2 Taking  . Budesonide-Formoterol Fumarate (SYMBICORT IN) Inhale 1 puff into the lungs daily as needed (shortness of breath).     . carvedilol (COREG) 12.5 MG tablet TAKE 1 TABLET IN THE MORNING AND 1.5 TABLETS IN THE EVENING (Patient taking differently: Take 12.5-18.75 mg by mouth See admin instructions. Take 12.5 mg by mouth in the morning and 18.75 mg in the evening) 210 tablet 3 Taking  . colesevelam (WELCHOL) 625 MG tablet TAKE 3 TABLETS (1,875 MG TOTAL) BY MOUTH 2 (TWO) TIMES DAILY WITH A MEAL. (Patient taking differently: Take 1,875 mg by mouth 2 (two) times daily with a meal. ) 540 tablet 3 Taking  . DEXILANT 60 MG capsule TAKE ONE CAPSULE BY MOUTH EVERY DAY (Patient taking differently: Take 60 mg by mouth daily. ) 90 capsule 3   . ELIQUIS 5 MG TABS tablet TAKE 1 TABLET BY MOUTH TWICE A DAY (Patient taking differently: Take 5 mg by mouth 2 (two) times daily. ) 180 tablet 3 Taking  . fluticasone (FLONASE) 50 MCG/ACT nasal spray USE 2 SPRAYS IN EACH NOSTRIL DAILY FOR 3 MONTHS (Patient taking differently: Place 2 sprays into both nostrils daily as needed (for allergies/congestion.). USE 2 SPRAYS IN EACH NOSTRIL DAILY FOR 3 MONTHS) 48 g 3 Taking  . losartan (COZAAR) 100 MG tablet TAKE 1 TABLET BY MOUTH EVERY DAY (Patient taking differently: Take 100 mg by mouth daily. ) 90 tablet 1 Taking  . spironolactone (ALDACTONE) 25 MG tablet TAKE 1 TABLET BY MOUTH EVERY DAY (Patient taking differently: Take 25 mg by mouth daily. ) 90 tablet 3 Taking  . zafirlukast (ACCOLATE) 20 MG tablet TAKE 1 TABLET (20 MG TOTAL) BY MOUTH 2 (TWO) TIMES DAILY. (Patient taking differently: Take 20 mg by mouth 2 (two) times daily before a meal. ) 180 tablet 4 Taking  . Blood Glucose Monitoring Suppl (ACCU-CHEK AVIVA PLUS)  w/Device KIT Use to check FBS QAM - DX:E11.9 1 kit 1   . Blood Glucose Monitoring Suppl (ONE TOUCH ULTRA 2) W/DEVICE KIT Check FBS qam - Dx:250.00 and she will need lancets (1 box) , strips (1 bottle = 100) and controls also thanks 1 each 0 Taking  . glucose blood (ACCU-CHEK AVIVA) test strip Check FBS QAM - DX:E11.9 100 each 3   . glucose blood test strip Use to check BS QAM DX: E11.9 100 each 3   . Lancets (ACCU-CHEK SOFT TOUCH) lancets Use to check BS QAM DX: E11.9 100 each 3    Allergies  Allergen Reactions  . Adhesive [Tape] Other (See Comments)    Skin turns red   . Diphenhydramine Other (See Comments)    Pt feels wired, hyperactive  . Vicodin [Hydrocodone-Acetaminophen] Nausea And Vomiting    Social History  Tobacco Use  . Smoking status: Never Smoker  . Smokeless tobacco: Never Used  Substance Use Topics  . Alcohol use: No    Family History  Problem Relation Age of Onset  . Asthma Maternal Grandmother   . Breast cancer Maternal Grandmother        dx. 64s  . Stomach cancer Maternal Grandmother 60       lots of stomach problems  . Heart attack Paternal Grandfather   . Other Daughter        cyst on ovary; unilateral oophorectomy  . Heart attack Brother   . Cancer Maternal Aunt        unknown type  . Celiac disease Maternal Aunt   . Diabetes Maternal Uncle   . Colon cancer Cousin        dx. 50s-60s  . Colon cancer Paternal Aunt        dx. late 39s  . Lung cancer Paternal Aunt        dx. late 60s-70s; former smoker  . Heart attack Paternal Aunt   . Heart attack Paternal Uncle      Review of Systems  Constitutional: Negative for chills and fever.  HENT: Negative for congestion, sore throat and tinnitus.   Eyes: Negative for double vision, photophobia and pain.  Respiratory: Negative for cough, shortness of breath and wheezing.   Cardiovascular: Negative for chest pain, palpitations and orthopnea.  Gastrointestinal: Negative for heartburn, nausea and vomiting.    Genitourinary: Negative for dysuria, frequency and urgency.  Musculoskeletal: Positive for joint pain.  Neurological: Negative for dizziness, weakness and headaches.    Objective:  Physical Exam  Well nourished and well developed.  General: Alert and oriented x3, cooperative and pleasant, no acute distress.  Head: normocephalic, atraumatic, neck supple.  Eyes: EOMI.  Respiratory: breath sounds clear in all fields, no wheezing, rales, or rhonchi. Cardiovascular: Regular rate and rhythm, no murmurs, gallops or rubs.  Abdomen: non-tender to palpation and soft, normoactive bowel sounds. Musculoskeletal: Left Knee Exam: Left knee is tender along the medial and lateral joint lines. Moderate patellofemoral crepitus. AROM 5-120 degrees. Calves soft and nontender. Motor function intact in LE. Strength 5/5 LE bilaterally. Neuro: Distal pulses 2+. Sensation to light touch intact in LE.  Vital signs in last 24 hours: Blood pressure: 100/64 mmHg Pulse: 72 bpm  Labs:   Estimated body mass index is 31.64 kg/m as calculated from the following:   Height as of 07/26/18: 5' 2"  (1.575 m).   Weight as of 07/26/18: 78.5 kg.   Imaging Review Plain radiographs demonstrate severe degenerative joint disease of the left knee(s). The overall alignment isneutral. The bone quality appears to be adequate for age and reported activity level.   Preoperative templating of the joint replacement has been completed, documented, and submitted to the Operating Room personnel in order to optimize intra-operative equipment management.   Anticipated LOS equal to or greater than 2 midnights due to - Age 25 and older with one or more of the following:  - Obesity  - Expected need for hospital services (PT, OT, Nursing) required for safe  discharge  - Anticipated need for postoperative skilled nursing care or inpatient rehab  - Active co-morbidities: Diabetes and DVT/VTE OR   - Unanticipated findings during/Post  Surgery: None  - Patient is a high risk of re-admission due to: None     Assessment/Plan:  End stage arthritis, left knee   The patient history, physical examination, clinical judgment of the provider  and imaging studies are consistent with end stage degenerative joint disease of the left knee(s) and total knee arthroplasty is deemed medically necessary. The treatment options including medical management, injection therapy arthroscopy and arthroplasty were discussed at length. The risks and benefits of total knee arthroplasty were presented and reviewed. The risks due to aseptic loosening, infection, stiffness, patella tracking problems, thromboembolic complications and other imponderables were discussed. The patient acknowledged the explanation, agreed to proceed with the plan and consent was signed. Patient is being admitted for inpatient treatment for surgery, pain control, PT, OT, prophylactic antibiotics, VTE prophylaxis, progressive ambulation and ADL's and discharge planning. The patient is planning to be discharged home.   Therapy Plans: outpatient therapy at [facility] Disposition: Home with husband Planned DVT Prophylaxis: Eliquis 5 mg BID DME needed: None PCP: Jenna Luo, MD TXA: Topical (hx of PE) Allergies: Codeine (nausea) Anesthesia Concerns: Nausea/vomiting BMI: 31.5 Last HgbA1c: 6.6%  - Patient was instructed on what medications to stop prior to surgery. - Follow-up visit in 2 weeks with Dr. Wynelle Link - Begin physical therapy following surgery - Pre-operative lab work as pre-surgical testing - Prescriptions will be provided in hospital at time of discharge  Theresa Duty, PA-C Orthopedic Surgery [facility] [place]

## 2018-10-24 NOTE — Patient Instructions (Addendum)
[name]  10/24/2018   Your procedure is scheduled on: 10-31-18    Report to [facility] Main  Entrance    Report to Admitting at 10:10 AM    Call this number if you have problems the morning of surgery [phone]    Remember: Do not eat food or drink liquids :After Midnight.      Take these medicines the morning of surgery with A SIP OF WATER: Dexilant, Carvedilol (Coreg), and Zafirlukast (Accolate) . You may also use and bring your nasal spray and inhaler as needed.                                You may not have any metal on your body including hair pins and              piercings  Do not wear jewelry, make-up, lotions, powders or perfumes, deodorant             Do not wear nail polish.  Do not shave  48 hours prior to surgery.               Do not bring valuables to the hospital. [place].  Contacts, dentures or bridgework may not be worn into surgery.  Leave suitcase in the car. After surgery it may be brought to your room.     Patients discharged the day of surgery will not be allowed to drive home. IF YOU ARE HAVING SURGERY AND GOING HOME THE SAME DAY, YOU MUST HAVE AN ADULT TO DRIVE YOU HOME AND BE WITH YOU FOR 24 HOURS. YOU MAY GO HOME BY TAXI OR UBER OR ORTHERWISE, BUT AN ADULT MUST ACCOMPANY YOU HOME AND STAY WITH YOU FOR 24 HOURS.    Special Instructions: N/A              Please read over the following fact sheets you were given: _____________________________________________________________________  How to Manage Your Diabetes Before and After Surgery  Why is it important to control my blood sugar before and after surgery? . Improving blood sugar levels before and after surgery helps healing and can limit problems. . A way of improving blood sugar control is eating a healthy diet by: o  Eating less sugar and carbohydrates o  Increasing activity/exercise o  Talking with your doctor  about reaching your blood sugar goals . High blood sugars (greater than 180 mg/dL) can raise your risk of infections and slow your recovery, so you will need to focus on controlling your diabetes during the weeks before surgery. . Make sure that the doctor who takes care of your diabetes knows about your planned surgery including the date and location.  How do I manage my blood sugar before surgery? . Check your blood sugar at least 4 times a day, starting 2 days before surgery, to make sure that the level is not too high or low. o Check your blood sugar the morning of your surgery when you wake up and every 2 hours until you get to the Short Stay unit. . If your blood sugar is less than 70 mg/dL, you will need to treat for low blood sugar: o Do not take insulin. o Treat a low blood sugar (  less than 70 mg/dL) with  cup of clear juice (cranberry or apple), 4 glucose tablets, OR glucose gel. o Recheck blood sugar in 15 minutes after treatment (to make sure it is greater than 70 mg/dL). If your blood sugar is not greater than 70 mg/dL on recheck, call [phone] for further instructions. . Report your blood sugar to the short stay nurse when you get to Short Stay.  . If you are admitted to the hospital after surgery: o Your blood sugar will be checked by the staff and you will probably be given insulin after surgery (instead of oral diabetes medicines) to make sure you have good blood sugar levels. o The goal for blood sugar control after surgery is 80-180 mg/dL.   WHAT DO I DO ABOUT MY DIABETES MEDICATION?  Marland Kitchen Do not take oral diabetes medicines (pills) the morning of surgery.  THE DAY BEFORE SURGERY, take your usual dose of Colesevelam (Welchol)       Reviewed and Endorsed by [facility] Patient Education Committee, August 2015           [facility] - Preparing for Surgery Before surgery, you can play an important role.  Because skin is not sterile, your skin needs to be as free of germs  as possible.  You can reduce the number of germs on your skin by washing with CHG (chlorahexidine gluconate) soap before surgery.  CHG is an antiseptic cleaner which kills germs and bonds with the skin to continue killing germs even after washing. Please DO NOT use if you have an allergy to CHG or antibacterial soaps.  If your skin becomes reddened/irritated stop using the CHG and inform your nurse when you arrive at Short Stay. Do not shave (including legs and underarms) for at least 48 hours prior to the first CHG shower.  You may shave your face/neck. Please follow these instructions carefully:  1.  Shower with CHG Soap the night before surgery and the  morning of Surgery.  2.  If you choose to wash your hair, wash your hair first as usual with your  normal  shampoo.  3.  After you shampoo, rinse your hair and body thoroughly to remove the  shampoo.                           4.  Use CHG as you would any other liquid soap.  You can apply chg directly  to the skin and wash                       Gently with a scrungie or clean washcloth.  5.  Apply the CHG Soap to your body ONLY FROM THE NECK DOWN.   Do not use on face/ open                           Wound or open sores. Avoid contact with eyes, ears mouth and genitals (private parts).                       Wash face,  Genitals (private parts) with your normal soap.             6.  Wash thoroughly, paying special attention to the area where your surgery  will be performed.  7.  Thoroughly rinse your body with warm water from the neck down.  8.  DO  NOT shower/wash with your normal soap after using and rinsing off  the CHG Soap.                9.  Pat yourself dry with a clean towel.            10.  Wear clean pajamas.            11.  Place clean sheets on your bed the night of your first shower and do not  sleep with pets. Day of Surgery : Do not apply any lotions/deodorants the morning of surgery.  Please wear clean clothes to the hospital/surgery  center.  FAILURE TO FOLLOW THESE INSTRUCTIONS MAY RESULT IN THE CANCELLATION OF YOUR SURGERY PATIENT SIGNATURE_________________________________  NURSE SIGNATURE__________________________________  ________________________________________________________________________   [name]  An incentive spirometer is a tool that can help keep your lungs clear and active. This tool measures how well you are filling your lungs with each breath. Taking long deep breaths may help reverse or decrease the chance of developing breathing (pulmonary) problems (especially infection) following:  A long period of time when you are unable to move or be active. BEFORE THE PROCEDURE   If the spirometer includes an indicator to show your best effort, your nurse or respiratory therapist will set it to a desired goal.  If possible, sit up straight or lean slightly forward. Try not to slouch.  Hold the incentive spirometer in an upright position. INSTRUCTIONS FOR USE  1. Sit on the edge of your bed if possible, or sit up as far as you can in bed or on a chair. 2. Hold the incentive spirometer in an upright position. 3. Breathe out normally. 4. Place the mouthpiece in your mouth and seal your lips tightly around it. 5. Breathe in slowly and as deeply as possible, raising the piston or the ball toward the top of the column. 6. Hold your breath for 3-5 seconds or for as long as possible. Allow the piston or ball to fall to the bottom of the column. 7. Remove the mouthpiece from your mouth and breathe out normally. 8. Rest for a few seconds and repeat Steps 1 through 7 at least 10 times every 1-2 hours when you are awake. Take your time and take a few normal breaths between deep breaths. 9. The spirometer may include an indicator to show your best effort. Use the indicator as a goal to work toward during each repetition. 10. After each set of 10 deep breaths, practice coughing to be sure your lungs are  clear. If you have an incision (the cut made at the time of surgery), support your incision when coughing by placing a pillow or rolled up towels firmly against it. Once you are able to get out of bed, walk around indoors and cough well. You may stop using the incentive spirometer when instructed by your caregiver.  RISKS AND COMPLICATIONS  Take your time so you do not get dizzy or light-headed.  If you are in pain, you may need to take or ask for pain medication before doing incentive spirometry. It is harder to take a deep breath if you are having pain. AFTER USE  Rest and breathe slowly and easily.  It can be helpful to keep track of a log of your progress. Your caregiver can provide you with a simple table to help with this. If you are using the spirometer at home, follow these instructions: [place] IF:   You are having  difficultly using the spirometer.  You have trouble using the spirometer as often as instructed.  Your pain medication is not giving enough relief while using the spirometer.  You develop fever of 100.5 F (38.1 C) or higher. SEEK IMMEDIATE MEDICAL CARE IF:   You cough up bloody sputum that had not been present before.  You develop fever of 102 F (38.9 C) or greater.  You develop worsening pain at or near the incision site. MAKE SURE YOU:   Understand these instructions.  Will watch your condition.  Will get help right away if you are not doing well or get worse. Document Released: 02/01/2007 Document Revised: 12/14/2011 Document Reviewed: 04/04/2007 ExitCare Patient Information 2014 Exit[employer], [place].   ________________________________________________________________________  WHAT IS A BLOOD TRANSFUSION? Blood Transfusion Information  A transfusion is the replacement of blood or some of its parts. Blood is made up of multiple cells which provide different functions.  Red blood cells carry oxygen and are used for blood loss  replacement.  White blood cells fight against infection.  Platelets control bleeding.  Plasma helps clot blood.  Other blood products are available for specialized needs, such as hemophilia or other clotting disorders. BEFORE THE TRANSFUSION  Who gives blood for transfusions?   Healthy volunteers who are fully evaluated to make sure their blood is safe. This is blood bank blood. Transfusion therapy is the safest it has ever been in the practice of medicine. Before blood is taken from a donor, a complete history is taken to make sure that person has no history of diseases nor engages in risky social behavior (examples are intravenous drug use or sexual activity with multiple partners). The donor's travel history is screened to minimize risk of transmitting infections, such as malaria. The donated blood is tested for signs of infectious diseases, such as HIV and hepatitis. The blood is then tested to be sure it is compatible with you in order to minimize the chance of a transfusion reaction. If you or a relative donates blood, this is often done in anticipation of surgery and is not appropriate for emergency situations. It takes many days to process the donated blood. RISKS AND COMPLICATIONS Although transfusion therapy is very safe and saves many lives, the main dangers of transfusion include:   Getting an infectious disease.  Developing a transfusion reaction. This is an allergic reaction to something in the blood you were given. Every precaution is taken to prevent this. The decision to have a blood transfusion has been considered carefully by your caregiver before blood is given. Blood is not given unless the benefits outweigh the risks. AFTER THE TRANSFUSION  Right after receiving a blood transfusion, you will usually feel much better and more energetic. This is especially true if your red blood cells have gotten low (anemic). The transfusion raises the level of the red blood cells which  carry oxygen, and this usually causes an energy increase.  The nurse administering the transfusion will monitor you carefully for complications. HOME CARE INSTRUCTIONS  No special instructions are needed after a transfusion. You may find your energy is better. Speak with your caregiver about any limitations on activity for underlying diseases you may have. SEEK MEDICAL CARE IF:   Your condition is not improving after your transfusion.  You develop redness or irritation at the intravenous (IV) site. SEEK IMMEDIATE MEDICAL CARE IF:  Any of the following symptoms occur over the next 12 hours:  Shaking chills.  You have a temperature by mouth above  102 F (38.9 C), not controlled by medicine.  Chest, back, or muscle pain.  People around you feel you are not acting correctly or are confused.  Shortness of breath or difficulty breathing.  Dizziness and fainting.  You get a rash or develop hives.  You have a decrease in urine output.  Your urine turns a dark color or changes to pink, red, or brown. Any of the following symptoms occur over the next 10 days:  You have a temperature by mouth above 102 F (38.9 C), not controlled by medicine.  Shortness of breath.  Weakness after normal activity.  The white part of the eye turns yellow (jaundice).  You have a decrease in the amount of urine or are urinating less often.  Your urine turns a dark color or changes to pink, red, or brown. Document Released: 09/18/2000 Document Revised: 12/14/2011 Document Reviewed: 05/07/2008 [facility] Patient Information 2014 [place], [place].  _______________________________________________________________________

## 2018-10-24 NOTE — Progress Notes (Addendum)
07-26-18 Cardiac Clearance on chart  07-26-18 Surgical Clearance from Dr. Dennard Schaumann on chart  07/27/18 (Epic) Telephone encounter Dr. Claris Gladden office   04-22-18 (Epic) EKG  04-21-18 (Epic) CXR  01-28-18 (Epic) ECHO   10-24-18 CMP result routed to Dr. Wynelle Link for review

## 2018-10-25 ENCOUNTER — Other Ambulatory Visit: Payer: Self-pay

## 2018-10-25 ENCOUNTER — Encounter (HOSPITAL_COMMUNITY): Payer: Self-pay

## 2018-10-25 ENCOUNTER — Encounter (HOSPITAL_COMMUNITY)
Admission: RE | Admit: 2018-10-25 | Discharge: 2018-10-25 | Disposition: A | Discharge status: Home / Self Care | Payer: Medicare Other | Source: Ambulatory Visit | Attending: Orthopedic Surgery | Admitting: Orthopedic Surgery

## 2018-10-25 DIAGNOSIS — M1712 Unilateral primary osteoarthritis, left knee: Secondary | ICD-10-CM | POA: Insufficient documentation

## 2018-10-25 DIAGNOSIS — Z01812 Encounter for preprocedural laboratory examination: Secondary | ICD-10-CM | POA: Diagnosis not present

## 2018-10-25 LAB — SURGICAL PCR SCREEN
MRSA, PCR: NEGATIVE
Staphylococcus aureus: NEGATIVE

## 2018-10-25 LAB — CBC
HCT: 34.6 % — ABNORMAL LOW (ref 36.0–46.0)
Hemoglobin: 10.8 g/dL — ABNORMAL LOW (ref 12.0–15.0)
MCH: 31.1 pg (ref 26.0–34.0)
MCHC: 31.2 g/dL (ref 30.0–36.0)
MCV: 99.7 fL (ref 80.0–100.0)
Platelets: 250 10*3/uL (ref 150–400)
RBC: 3.47 MIL/uL — ABNORMAL LOW (ref 3.87–5.11)
RDW: 12.6 % (ref 11.5–15.5)
WBC: 6.5 10*3/uL (ref 4.0–10.5)
nRBC: 0 % (ref 0.0–0.2)

## 2018-10-25 LAB — COMPREHENSIVE METABOLIC PANEL
ALK PHOS: 71 U/L (ref 38–126)
ALT: 17 U/L (ref 0–44)
AST: 15 U/L (ref 15–41)
Albumin: 4.1 g/dL (ref 3.5–5.0)
Anion gap: 7 (ref 5–15)
BUN: 25 mg/dL — ABNORMAL HIGH (ref 8–23)
CO2: 22 mmol/L (ref 22–32)
Calcium: 8.9 mg/dL (ref 8.9–10.3)
Chloride: 110 mmol/L (ref 98–111)
Creatinine, Ser: 0.96 mg/dL (ref 0.44–1.00)
GFR calc Af Amer: >60 mL/min (ref >60)
GFR calc non Af Amer: 58 mL/min — ABNORMAL LOW (ref >60)
GLUCOSE: 104 mg/dL — AB (ref 70–99)
Potassium: 3.9 mmol/L (ref 3.5–5.1)
Sodium: 139 mmol/L (ref 135–145)
Total Bilirubin: 0.7 mg/dL (ref 0.3–1.2)
Total Protein: 6.9 g/dL (ref 6.5–8.1)

## 2018-10-25 LAB — APTT: aPTT: 31 seconds (ref 24–36)

## 2018-10-25 LAB — HEMOGLOBIN A1C
HEMOGLOBIN A1C: 6.6 % — AB (ref 4.8–5.6)
MEAN PLASMA GLUCOSE: 142.72 mg/dL

## 2018-10-25 LAB — PROTIME-INR
INR: 1.3
Prothrombin Time: 16 seconds — ABNORMAL HIGH (ref 11.4–15.2)

## 2018-10-25 LAB — GLUCOSE, CAPILLARY: Glucose-Capillary: 151 mg/dL — ABNORMAL HIGH (ref 70–99)

## 2018-10-27 NOTE — Progress Notes (Signed)
Anesthesia Chart Review   Case:  [id number] Date/Time:  10/31/18 1225   Procedure:  TOTAL KNEE ARTHROPLASTY (Left ) - 42mn   Anesthesia type:  Choice   Pre-op diagnosis:  left knee osteoarthritis   Location:  [facility] ROOM 10 / [facility]   Surgeon:  AGaynelle Arabian MD      DISCUSSION: 73yo never smoker with h/o GERD, hiatal hernia, HTN, NICM, DM II, breast cancer (mastectomy, AC -T chemo, followed by aromatase inhibitor x 5 years dx 4/08), PE (s/p tram flap surgery 2008), LBBB, left knee osteoarthritis scheduled for above surgery on 10/31/18.   Pt last seen by PCP, Dr. WJenna Luo on 07/26/18.  At this visit she complains of an episode diaphoresis and near syncope.  D-dimer ordered which was abnormal, followed with with CT angio. CT angio 07/27/18 which was negative for PE. Continued follow up with Dr. PDennard Schaumann  Dr. PDennard Schaumannattributes sx to vasovagal episodes.  Clearance received from PCP on 07/26/18 (on chart).  Per clearance she is moderate risk due to non ischemic cardiomyopathy and h/o PE.  Advised pt to hold Eliquis 2 days prior to surgery and resume 1 day or as soon as possible post operatively.   Last visit with Cardiologist, Dr. DLoralie Champagne on 05/03/18.  At this time pt stable.  Clearance received from Dr. MAundra Dubinon 07/26/18 which advises the pt to hold Eliquis 2 day prior to surgery.   Pt can proceed with planned procedure barring acute status change.  VS: BP 99/61   Pulse 80   Temp 36.8 C (Oral)   Resp 18   Ht _0  (1.575 m)   Wt 80.5 kg   SpO2 99%   BMI 32.44 kg/m   PROVIDERS: PSusy Frizzle MD is PCP  MLoralie Champagne MD is Cardiologist  LABS: Labs reviewed: Acceptable for surgery. (all labs ordered are listed, but only abnormal results are displayed)  Labs Reviewed  CBC - Abnormal; Notable for the following components:      Result Value   RBC 3.47 (*)    Hemoglobin 10.8 (*)    HCT 34.6 (*)    All other components within normal limits  COMPREHENSIVE METABOLIC  PANEL - Abnormal; Notable for the following components:   Glucose, Bld 104 (*)    BUN 25 (*)    GFR calc non Af Amer 58 (*)    All other components within normal limits  PROTIME-INR - Abnormal; Notable for the following components:   Prothrombin Time 16.0 (*)    All other components within normal limits  HEMOGLOBIN A1C - Abnormal; Notable for the following components:   Hgb A1c MFr Bld 6.6 (*)    All other components within normal limits  GLUCOSE, CAPILLARY - Abnormal; Notable for the following components:   Glucose-Capillary 151 (*)    All other components within normal limits  SURGICAL PCR SCREEN  APTT  TYPE AND SCREEN     IMAGES: CT Angio Chest 07/27/18 IMPRESSION: 1. Negative for acute pulmonary embolus. 2. Diffuse bilateral subtle geographic regions of ground-glass attenuation airspace opacity superimposed on a background of diffuse mild bronchial wall thickening. The imaging findings are nonspecific. Differential considerations include scattered areas of subsegmental atelectasis secondary to tachypnea with shallow inspirations, asthma or COPD exacerbation, inhalational lung injury, and acute hypersensitivity pneumonitis. Atypical or viral respiratory infection could also appear similar in the appropriate clinical setting.  EKG: 04/21/18 Rate 76 bpm Sinus rhythm Left bundle branch block   CV: Echo 01/28/18  Study Conclusions  - Left ventricle: The cavity size was normal. Wall thickness was   increased in a pattern of mild LVH. Systolic function was normal.   The estimated ejection fraction was in the range of 50% to 55%.   Wall motion was normal; there were no regional wall motion   abnormalities. Doppler parameters are consistent with abnormal   left ventricular relaxation (grade 1 diastolic dysfunction). - Ventricular septum: Septal motion showed abnormal function and   dyssynergy. These changes are consistent with a left bundle   branch block. - Mitral  valve: Mildly calcified annulus.  Past Medical History:  Diagnosis Date  . Allergy history unknown   . Arthritis    oa  . Asthma    takes acolate for  . Breast cancer ([place]) 1999; 2008   hx of bilateral breast cancer  . Breast cancer, left breast ([place]) 10/27/2012   3.2 cm ER positive, Her-2 negative, 1 node positive S/P mastectomy/TTRAM reconstruction 4/08  . Breast cancer, right breast ([place]) 10/27/2012   3.2 cm ER/PR positive, 1 node positive, Her-2 negative Rx w mastectomy, AC -T chemo, followed by aromatase inhibitor x 5 years dx 4/08  . Cancer [facility])    breast CA  . Dehydration after exertion    after she went berry picking in the summer heat . see ED visit   . Diabetes ([place])   . DM2 (diabetes mellitus, type 2) ([place])    on welchol for  . Dyspnea    with exertion  . Elevated cholesterol   . GERD (gastroesophageal reflux disease)   . Hiatal hernia   . Hypertension   . LBBB (left bundle branch block) 10/27/2012  . Leg cramps   . NICM (nonischemic cardiomyopathy) ([place])    a. Echo:  06/14/12: Poor endocardial definition, possible septal HK, EF 50-55%, normal wall motion, mild LAE ;  b.  [facility] 9/13:  normal cors, EF 35-40%,  c. Echo 7/14: EF 50-55%, mild LAE  . Other fatigue 01/01/2015  . Pulmonary embolus ([place]) 2001   after tram flap surgery    Past Surgical History:  Procedure Laterality Date  . DILATION AND CURETTAGE OF UTERUS   37yr ago   x 2  . KNEE CLOSED REDUCTION Right 06/01/2018   Procedure: CLOSED MANIPULATION RIGHT KNEE;  Surgeon: AGaynelle Arabian MD;  Location: [facility];  Service: Orthopedics;  Laterality: Right;  . KNEE SURGERY  2006   left torn cartilage repair  . LEFT HEART CATHETERIZATION WITH CORONARY ANGIOGRAM N/A 06/15/2012   Procedure: LEFT HEART CATHETERIZATION WITH CORONARY ANGIOGRAM;  Surgeon: MWellington Hampshire MD;  [facility];  Service: Cardiovascular;  Laterality: N/A;  . MASTECTOMY     bilateral  . RECONSTRUCTION BREAST W/ TRAM FLAP  2001  .  TOTAL KNEE ARTHROPLASTY Right 03/07/2018   Procedure: RIGHT TOTAL KNEE ARTHROPLASTY;  Surgeon: AGaynelle Arabian MD;  Location[facility];  Service: Orthopedics;  Laterality: Right;    MEDICATIONS: . acetaminophen (TYLENOL) 500 MG tablet  . atorvastatin (LIPITOR) 20 MG tablet  . Blood Glucose Monitoring Suppl (ACCU-CHEK AVIVA PLUS) w/Device KIT  . Blood Glucose Monitoring Suppl (ONE TOUCH ULTRA 2) W/DEVICE KIT  . Budesonide-Formoterol Fumarate (SYMBICORT IN)  . carvedilol (COREG) 12.5 MG tablet  . colesevelam (WELCHOL) 625 MG tablet  . DEXILANT 60 MG capsule  . ELIQUIS 5 MG TABS tablet  . fluticasone (FLONASE) 50 MCG/ACT nasal spray  . glucose blood (ACCU-CHEK AVIVA) test strip  . glucose blood test strip  .  Lancets (ACCU-CHEK SOFT TOUCH) lancets  . losartan (COZAAR) 100 MG tablet  . spironolactone (ALDACTONE) 25 MG tablet  . zafirlukast (ACCOLATE) 20 MG tablet   No current facility-administered medications for this encounter.     Maia Plan [facility] Pre-Surgical Testing [phone] 10/27/18 1:38 PM

## 2018-10-27 NOTE — Anesthesia Preprocedure Evaluation (Addendum)
Anesthesia Evaluation  Patient identified by MRN, date of birth, ID band Patient awake    Reviewed: Allergy & Precautions, NPO status   Airway Mallampati: I  TM Distance: >3 FB Neck ROM: Full    Dental   Pulmonary    Pulmonary exam normal        Cardiovascular hypertension, Pt. on medications Normal cardiovascular exam     Neuro/Psych    GI/Hepatic GERD  Medicated and Controlled,  Endo/Other  diabetes, Type 2  Renal/GU      Musculoskeletal   Abdominal   Peds  Hematology   Anesthesia Other Findings   Reproductive/Obstetrics                            Anesthesia Physical Anesthesia Plan  ASA: III  Anesthesia Plan: Spinal   Post-op Pain Management:  Regional for Post-op pain   Induction:   PONV Risk Score and Plan: 2 and Ondansetron and Midazolam  Airway Management Planned: Simple Face Mask  Additional Equipment:   Intra-op Plan:   Post-operative Plan:   Informed Consent: I have reviewed the patients History and Physical, chart, labs and discussed the procedure including the risks, benefits and alternatives for the proposed anesthesia with the patient or authorized representative who has indicated his/her understanding and acceptance.       Plan Discussed with: CRNA and Surgeon  Anesthesia Plan Comments: (See PST note 10/25/18, Konrad Felix, PA-C)       Anesthesia Quick Evaluation

## 2018-10-30 MED ORDER — BUPIVACAINE LIPOSOME 1.3 % IJ SUSP
20.0000 mL | Freq: Once | INTRAMUSCULAR | Status: DC
  Filled 2018-10-30: qty 20

## 2018-10-30 MED ORDER — TRANEXAMIC ACID 1000 MG/10ML IV SOLN
2000.0000 mg | INTRAVENOUS | Status: DC
  Filled 2018-10-30: qty 20

## 2018-10-31 ENCOUNTER — Inpatient Hospital Stay (HOSPITAL_COMMUNITY): Payer: Medicare Other | Admitting: Certified Registered"

## 2018-10-31 ENCOUNTER — Encounter (HOSPITAL_COMMUNITY): Payer: Self-pay | Admitting: *Deleted

## 2018-10-31 ENCOUNTER — Other Ambulatory Visit: Payer: Self-pay

## 2018-10-31 ENCOUNTER — Encounter (HOSPITAL_COMMUNITY)
Admission: RE | Disposition: A | Discharge status: Home Health | Payer: Self-pay | Source: Home / Self Care | Attending: Orthopedic Surgery

## 2018-10-31 ENCOUNTER — Inpatient Hospital Stay (HOSPITAL_COMMUNITY)
Admission: RE | Admit: 2018-10-31 | Discharge: 2018-11-04 | DRG: 470 | Disposition: A | Discharge status: Home Health | Payer: Medicare Other | Attending: Orthopedic Surgery | Admitting: Orthopedic Surgery

## 2018-10-31 ENCOUNTER — Inpatient Hospital Stay (HOSPITAL_COMMUNITY): Payer: Medicare Other | Admitting: Physician Assistant

## 2018-10-31 DIAGNOSIS — M1710 Unilateral primary osteoarthritis, unspecified knee: Secondary | ICD-10-CM

## 2018-10-31 DIAGNOSIS — Z9013 Acquired absence of bilateral breasts and nipples: Secondary | ICD-10-CM | POA: Diagnosis not present

## 2018-10-31 DIAGNOSIS — Z96651 Presence of right artificial knee joint: Secondary | ICD-10-CM | POA: Diagnosis present

## 2018-10-31 DIAGNOSIS — Z91048 Other nonmedicinal substance allergy status: Secondary | ICD-10-CM

## 2018-10-31 DIAGNOSIS — E669 Obesity, unspecified: Secondary | ICD-10-CM | POA: Diagnosis present

## 2018-10-31 DIAGNOSIS — I447 Left bundle-branch block, unspecified: Secondary | ICD-10-CM | POA: Diagnosis present

## 2018-10-31 DIAGNOSIS — Z9221 Personal history of antineoplastic chemotherapy: Secondary | ICD-10-CM

## 2018-10-31 DIAGNOSIS — E785 Hyperlipidemia, unspecified: Secondary | ICD-10-CM | POA: Diagnosis present

## 2018-10-31 DIAGNOSIS — Z8379 Family history of other diseases of the digestive system: Secondary | ICD-10-CM

## 2018-10-31 DIAGNOSIS — Z888 Allergy status to other drugs, medicaments and biological substances status: Secondary | ICD-10-CM

## 2018-10-31 DIAGNOSIS — M179 Osteoarthritis of knee, unspecified: Secondary | ICD-10-CM

## 2018-10-31 DIAGNOSIS — Z79899 Other long term (current) drug therapy: Secondary | ICD-10-CM | POA: Diagnosis not present

## 2018-10-31 DIAGNOSIS — Z86711 Personal history of pulmonary embolism: Secondary | ICD-10-CM

## 2018-10-31 DIAGNOSIS — Z7901 Long term (current) use of anticoagulants: Secondary | ICD-10-CM | POA: Diagnosis not present

## 2018-10-31 DIAGNOSIS — K219 Gastro-esophageal reflux disease without esophagitis: Secondary | ICD-10-CM | POA: Diagnosis present

## 2018-10-31 DIAGNOSIS — E119 Type 2 diabetes mellitus without complications: Secondary | ICD-10-CM | POA: Diagnosis present

## 2018-10-31 DIAGNOSIS — M1712 Unilateral primary osteoarthritis, left knee: Principal | ICD-10-CM | POA: Diagnosis present

## 2018-10-31 DIAGNOSIS — M25762 Osteophyte, left knee: Secondary | ICD-10-CM | POA: Diagnosis present

## 2018-10-31 DIAGNOSIS — Z7951 Long term (current) use of inhaled steroids: Secondary | ICD-10-CM

## 2018-10-31 DIAGNOSIS — Z853 Personal history of malignant neoplasm of breast: Secondary | ICD-10-CM

## 2018-10-31 DIAGNOSIS — Z801 Family history of malignant neoplasm of trachea, bronchus and lung: Secondary | ICD-10-CM

## 2018-10-31 DIAGNOSIS — Z833 Family history of diabetes mellitus: Secondary | ICD-10-CM

## 2018-10-31 DIAGNOSIS — Z885 Allergy status to narcotic agent status: Secondary | ICD-10-CM

## 2018-10-31 DIAGNOSIS — G8918 Other acute postprocedural pain: Secondary | ICD-10-CM | POA: Diagnosis not present

## 2018-10-31 DIAGNOSIS — I480 Paroxysmal atrial fibrillation: Secondary | ICD-10-CM | POA: Diagnosis present

## 2018-10-31 DIAGNOSIS — Z8249 Family history of ischemic heart disease and other diseases of the circulatory system: Secondary | ICD-10-CM

## 2018-10-31 DIAGNOSIS — J45909 Unspecified asthma, uncomplicated: Secondary | ICD-10-CM | POA: Diagnosis present

## 2018-10-31 DIAGNOSIS — Z825 Family history of asthma and other chronic lower respiratory diseases: Secondary | ICD-10-CM | POA: Diagnosis not present

## 2018-10-31 DIAGNOSIS — I10 Essential (primary) hypertension: Secondary | ICD-10-CM | POA: Diagnosis present

## 2018-10-31 DIAGNOSIS — I428 Other cardiomyopathies: Secondary | ICD-10-CM | POA: Diagnosis present

## 2018-10-31 DIAGNOSIS — Z803 Family history of malignant neoplasm of breast: Secondary | ICD-10-CM

## 2018-10-31 DIAGNOSIS — T8482XS Fibrosis due to internal orthopedic prosthetic devices, implants and grafts, sequela: Secondary | ICD-10-CM | POA: Diagnosis present

## 2018-10-31 DIAGNOSIS — E7800 Pure hypercholesterolemia, unspecified: Secondary | ICD-10-CM | POA: Diagnosis present

## 2018-10-31 DIAGNOSIS — Z800 Family history of malignant neoplasm of digestive organs: Secondary | ICD-10-CM

## 2018-10-31 DIAGNOSIS — Z6832 Body mass index (BMI) 32.0-32.9, adult: Secondary | ICD-10-CM

## 2018-10-31 HISTORY — PX: TOTAL KNEE ARTHROPLASTY: SHX125

## 2018-10-31 LAB — GLUCOSE, CAPILLARY
Glucose-Capillary: 97 mg/dL (ref 70–99)
Glucose-Capillary: 106 mg/dL — ABNORMAL HIGH (ref 70–99)
Glucose-Capillary: 151 mg/dL — ABNORMAL HIGH (ref 70–99)
Glucose-Capillary: 178 mg/dL — ABNORMAL HIGH (ref 70–99)

## 2018-10-31 LAB — TYPE AND SCREEN
ABO/RH(D): A POS
Antibody Screen: NEGATIVE

## 2018-10-31 SURGERY — ARTHROPLASTY, KNEE, TOTAL
Anesthesia: Spinal | Site: Knee | Laterality: Left

## 2018-10-31 MED ORDER — ONDANSETRON HCL 4 MG/2ML IJ SOLN
4.0000 mg | Freq: Once | INTRAMUSCULAR | Status: AC
  Administered 2018-10-31: 4 mg via INTRAVENOUS

## 2018-10-31 MED ORDER — INSULIN ASPART 100 UNIT/ML ~~LOC~~ SOLN
0.0000 [IU] | Freq: Three times a day (TID) | SUBCUTANEOUS | Status: DC
  Administered 2018-10-31 – 2018-11-01 (×2): 3 [IU] via SUBCUTANEOUS
  Administered 2018-11-01 – 2018-11-03 (×3): 2 [IU] via SUBCUTANEOUS

## 2018-10-31 MED ORDER — BISACODYL 10 MG RE SUPP: 10.0000 mg | Freq: Every day | RECTAL | Status: DC | PRN

## 2018-10-31 MED ORDER — TRAMADOL HCL 50 MG PO TABS
50.0000 mg | ORAL_TABLET | Freq: Four times a day (QID) | ORAL | Status: DC | PRN
  Administered 2018-11-02: 100 mg via ORAL
  Administered 2018-11-03 – 2018-11-04 (×3): 50 mg via ORAL
  Filled 2018-10-31: qty 1
  Filled 2018-10-31: qty 2
  Filled 2018-10-31 (×2): qty 1
  Filled 2018-10-31: qty 2
  Filled 2018-10-31: qty 1
  Filled 2018-10-31: qty 2

## 2018-10-31 MED ORDER — CARVEDILOL 12.5 MG PO TABS: 12.5000 mg | ORAL_TABLET | ORAL | Status: DC

## 2018-10-31 MED ORDER — LACTATED RINGERS IV SOLN
INTRAVENOUS | Status: DC
  Administered 2018-10-31: 11:00:00 via INTRAVENOUS

## 2018-10-31 MED ORDER — BUPIVACAINE LIPOSOME 1.3 % IJ SUSP
INTRAMUSCULAR | Status: DC | PRN
  Administered 2018-10-31: 20 mL

## 2018-10-31 MED ORDER — ATORVASTATIN CALCIUM 40 MG PO TABS
40.0000 mg | ORAL_TABLET | Freq: Every evening | ORAL | Status: DC
  Administered 2018-10-31 – 2018-11-03 (×4): 40 mg via ORAL
  Filled 2018-10-31 (×4): qty 1

## 2018-10-31 MED ORDER — CEFAZOLIN SODIUM-DEXTROSE 2-4 GM/100ML-% IV SOLN
2.0000 g | Freq: Four times a day (QID) | INTRAVENOUS | Status: AC
  Administered 2018-10-31 – 2018-11-01 (×2): 2 g via INTRAVENOUS
  Filled 2018-10-31 (×2): qty 100

## 2018-10-31 MED ORDER — MOMETASONE FURO-FORMOTEROL FUM 100-5 MCG/ACT IN AERO
1.0000 | INHALATION_SPRAY | Freq: Every day | RESPIRATORY_TRACT | Status: DC | PRN
  Administered 2018-11-03: 1 via RESPIRATORY_TRACT
  Filled 2018-10-31: qty 8.8

## 2018-10-31 MED ORDER — CARVEDILOL 12.5 MG PO TABS
12.5000 mg | ORAL_TABLET | Freq: Every day | ORAL | Status: DC
  Administered 2018-11-01 – 2018-11-04 (×4): 12.5 mg via ORAL
  Filled 2018-10-31 (×5): qty 1

## 2018-10-31 MED ORDER — PHENYLEPHRINE 40 MCG/ML (10ML) SYRINGE FOR IV PUSH (FOR BLOOD PRESSURE SUPPORT)
PREFILLED_SYRINGE | INTRAVENOUS | Status: AC
  Filled 2018-10-31: qty 10

## 2018-10-31 MED ORDER — HYDROMORPHONE HCL 1 MG/ML IJ SOLN: 0.2500 mg | INTRAMUSCULAR | Status: DC | PRN

## 2018-10-31 MED ORDER — FENTANYL CITRATE (PF) 100 MCG/2ML IJ SOLN
50.0000 ug | INTRAMUSCULAR | Status: DC
  Administered 2018-10-31: 50 ug via INTRAVENOUS
  Filled 2018-10-31: qty 2

## 2018-10-31 MED ORDER — DEXAMETHASONE SODIUM PHOSPHATE 10 MG/ML IJ SOLN
8.0000 mg | Freq: Once | INTRAMUSCULAR | Status: AC
  Administered 2018-10-31: 8 mg via INTRAVENOUS

## 2018-10-31 MED ORDER — CEFAZOLIN SODIUM-DEXTROSE 2-4 GM/100ML-% IV SOLN
2.0000 g | INTRAVENOUS | Status: AC
  Administered 2018-10-31: 2 g via INTRAVENOUS
  Filled 2018-10-31: qty 100

## 2018-10-31 MED ORDER — ACETAMINOPHEN 500 MG PO TABS
1000.0000 mg | ORAL_TABLET | Freq: Four times a day (QID) | ORAL | Status: AC
  Administered 2018-10-31 – 2018-11-01 (×4): 1000 mg via ORAL
  Filled 2018-10-31 (×4): qty 2

## 2018-10-31 MED ORDER — PANTOPRAZOLE SODIUM 40 MG PO TBEC
80.0000 mg | DELAYED_RELEASE_TABLET | Freq: Every day | ORAL | Status: DC
  Administered 2018-11-01 – 2018-11-04 (×4): 80 mg via ORAL
  Filled 2018-10-31 (×4): qty 2

## 2018-10-31 MED ORDER — SODIUM CHLORIDE (PF) 0.9 % IJ SOLN
INTRAMUSCULAR | Status: AC
  Filled 2018-10-31: qty 50

## 2018-10-31 MED ORDER — CHLORHEXIDINE GLUCONATE 4 % EX LIQD
60.0000 mL | Freq: Once | CUTANEOUS | Status: AC
  Administered 2018-10-31: 4 via TOPICAL

## 2018-10-31 MED ORDER — MIDAZOLAM HCL 2 MG/2ML IJ SOLN
1.0000 mg | INTRAMUSCULAR | Status: DC
  Administered 2018-10-31: 2 mg via INTRAVENOUS
  Filled 2018-10-31: qty 2

## 2018-10-31 MED ORDER — MENTHOL 3 MG MT LOZG
1.0000 | LOZENGE | OROMUCOSAL | Status: DC | PRN
  Administered 2018-10-31: 3 mg via ORAL

## 2018-10-31 MED ORDER — PHENYLEPHRINE 40 MCG/ML (10ML) SYRINGE FOR IV PUSH (FOR BLOOD PRESSURE SUPPORT)
PREFILLED_SYRINGE | INTRAVENOUS | Status: DC | PRN
  Administered 2018-10-31 (×2): 80 ug via INTRAVENOUS
  Administered 2018-10-31 (×2): 120 ug via INTRAVENOUS

## 2018-10-31 MED ORDER — METHOCARBAMOL 500 MG IVPB - SIMPLE MED
500.0000 mg | Freq: Four times a day (QID) | INTRAVENOUS | Status: DC | PRN
  Filled 2018-10-31: qty 50

## 2018-10-31 MED ORDER — ONDANSETRON HCL 4 MG/2ML IJ SOLN: 4.0000 mg | Freq: Once | INTRAMUSCULAR | Status: DC | PRN

## 2018-10-31 MED ORDER — MEPERIDINE HCL 50 MG/ML IJ SOLN: 6.2500 mg | INTRAMUSCULAR | Status: DC | PRN

## 2018-10-31 MED ORDER — SODIUM CHLORIDE 0.9 % IV SOLN
INTRAVENOUS | Status: DC
  Administered 2018-10-31: 18:00:00 via INTRAVENOUS

## 2018-10-31 MED ORDER — DOCUSATE SODIUM 100 MG PO CAPS
100.0000 mg | ORAL_CAPSULE | Freq: Two times a day (BID) | ORAL | Status: DC
  Administered 2018-10-31 – 2018-11-04 (×8): 100 mg via ORAL
  Filled 2018-10-31 (×8): qty 1

## 2018-10-31 MED ORDER — PROPOFOL 10 MG/ML IV BOLUS
INTRAVENOUS | Status: AC
  Filled 2018-10-31: qty 80

## 2018-10-31 MED ORDER — MONTELUKAST SODIUM 10 MG PO TABS
10.0000 mg | ORAL_TABLET | Freq: Every day | ORAL | Status: DC
  Administered 2018-10-31 – 2018-11-03 (×4): 10 mg via ORAL
  Filled 2018-10-31 (×4): qty 1

## 2018-10-31 MED ORDER — ONDANSETRON HCL 4 MG/2ML IJ SOLN
INTRAMUSCULAR | Status: DC | PRN
  Administered 2018-10-31: 4 mg via INTRAVENOUS

## 2018-10-31 MED ORDER — STERILE WATER FOR IRRIGATION IR SOLN
Status: DC | PRN
  Administered 2018-10-31: 2000 mL

## 2018-10-31 MED ORDER — METHOCARBAMOL 500 MG PO TABS
500.0000 mg | ORAL_TABLET | Freq: Four times a day (QID) | ORAL | Status: DC | PRN
  Administered 2018-10-31 – 2018-11-03 (×5): 500 mg via ORAL
  Filled 2018-10-31 (×7): qty 1

## 2018-10-31 MED ORDER — ONDANSETRON HCL 4 MG PO TABS
4.0000 mg | ORAL_TABLET | Freq: Four times a day (QID) | ORAL | Status: DC | PRN
  Administered 2018-11-02: 4 mg via ORAL
  Filled 2018-10-31: qty 1

## 2018-10-31 MED ORDER — COLESEVELAM HCL 625 MG PO TABS
1875.0000 mg | ORAL_TABLET | Freq: Two times a day (BID) | ORAL | Status: DC
  Administered 2018-10-31 – 2018-11-04 (×8): 1875 mg via ORAL
  Filled 2018-10-31 (×11): qty 3

## 2018-10-31 MED ORDER — DIPHENHYDRAMINE HCL 12.5 MG/5ML PO ELIX: 12.5000 mg | ORAL_SOLUTION | ORAL | Status: DC | PRN

## 2018-10-31 MED ORDER — METOCLOPRAMIDE HCL 5 MG PO TABS: 5.0000 mg | ORAL_TABLET | Freq: Three times a day (TID) | ORAL | Status: DC | PRN

## 2018-10-31 MED ORDER — PROPOFOL 10 MG/ML IV BOLUS
INTRAVENOUS | Status: DC | PRN
  Administered 2018-10-31: 10 mg via INTRAVENOUS
  Administered 2018-10-31: 20 mg via INTRAVENOUS

## 2018-10-31 MED ORDER — SPIRONOLACTONE 25 MG PO TABS
25.0000 mg | ORAL_TABLET | Freq: Every day | ORAL | Status: DC
  Administered 2018-11-01 – 2018-11-04 (×4): 25 mg via ORAL
  Filled 2018-10-31 (×4): qty 1

## 2018-10-31 MED ORDER — METOCLOPRAMIDE HCL 5 MG/ML IJ SOLN: 5.0000 mg | Freq: Three times a day (TID) | INTRAMUSCULAR | Status: DC | PRN

## 2018-10-31 MED ORDER — LOSARTAN POTASSIUM 50 MG PO TABS
100.0000 mg | ORAL_TABLET | Freq: Every day | ORAL | Status: DC
  Administered 2018-11-02 – 2018-11-04 (×3): 100 mg via ORAL
  Filled 2018-10-31 (×3): qty 2

## 2018-10-31 MED ORDER — ACETAMINOPHEN 10 MG/ML IV SOLN
1000.0000 mg | Freq: Four times a day (QID) | INTRAVENOUS | Status: DC
  Administered 2018-10-31: 1000 mg via INTRAVENOUS
  Filled 2018-10-31: qty 100

## 2018-10-31 MED ORDER — ROPIVACAINE HCL 7.5 MG/ML IJ SOLN
INTRAMUSCULAR | Status: DC | PRN
  Administered 2018-10-31: 20 mL via PERINEURAL

## 2018-10-31 MED ORDER — MORPHINE SULFATE (PF) 4 MG/ML IV SOLN: 1.0000 mg | INTRAVENOUS | Status: DC | PRN

## 2018-10-31 MED ORDER — CARVEDILOL 6.25 MG PO TABS
18.7500 mg | ORAL_TABLET | Freq: Every day | ORAL | Status: DC
  Administered 2018-10-31 – 2018-11-03 (×4): 18.75 mg via ORAL
  Filled 2018-10-31 (×4): qty 1

## 2018-10-31 MED ORDER — DEXAMETHASONE SODIUM PHOSPHATE 10 MG/ML IJ SOLN
INTRAMUSCULAR | Status: AC
  Filled 2018-10-31: qty 1

## 2018-10-31 MED ORDER — ONDANSETRON HCL 4 MG/2ML IJ SOLN
INTRAMUSCULAR | Status: AC
  Filled 2018-10-31: qty 2

## 2018-10-31 MED ORDER — FLUTICASONE PROPIONATE 50 MCG/ACT NA SUSP: 2.0000 | Freq: Every day | NASAL | Status: DC | PRN

## 2018-10-31 MED ORDER — DEXAMETHASONE SODIUM PHOSPHATE 10 MG/ML IJ SOLN
10.0000 mg | Freq: Once | INTRAMUSCULAR | Status: AC
  Administered 2018-11-01: 10 mg via INTRAVENOUS
  Filled 2018-10-31: qty 1

## 2018-10-31 MED ORDER — SODIUM CHLORIDE (PF) 0.9 % IJ SOLN
INTRAMUSCULAR | Status: DC | PRN
  Administered 2018-10-31: 60 mL

## 2018-10-31 MED ORDER — SODIUM CHLORIDE 0.9 % IR SOLN
Status: DC | PRN
  Administered 2018-10-31: 1000 mL

## 2018-10-31 MED ORDER — GABAPENTIN 300 MG PO CAPS
300.0000 mg | ORAL_CAPSULE | Freq: Once | ORAL | Status: AC
  Administered 2018-10-31: 300 mg via ORAL
  Filled 2018-10-31: qty 1

## 2018-10-31 MED ORDER — ONDANSETRON HCL 4 MG/2ML IJ SOLN
4.0000 mg | Freq: Four times a day (QID) | INTRAMUSCULAR | Status: DC | PRN
  Administered 2018-10-31 – 2018-11-01 (×2): 4 mg via INTRAVENOUS
  Filled 2018-10-31 (×2): qty 2

## 2018-10-31 MED ORDER — PHENOL 1.4 % MT LIQD: 1.0000 | OROMUCOSAL | Status: DC | PRN

## 2018-10-31 MED ORDER — POLYETHYLENE GLYCOL 3350 17 G PO PACK
17.0000 g | PACK | Freq: Every day | ORAL | Status: DC | PRN
  Administered 2018-11-04: 17 g via ORAL
  Filled 2018-10-31: qty 1

## 2018-10-31 MED ORDER — GABAPENTIN 300 MG PO CAPS
300.0000 mg | ORAL_CAPSULE | Freq: Three times a day (TID) | ORAL | Status: DC
  Administered 2018-10-31 – 2018-11-04 (×10): 300 mg via ORAL
  Filled 2018-10-31 (×10): qty 1

## 2018-10-31 MED ORDER — PROPOFOL 500 MG/50ML IV EMUL
INTRAVENOUS | Status: DC | PRN
  Administered 2018-10-31: 100 ug/kg/min via INTRAVENOUS

## 2018-10-31 MED ORDER — ONDANSETRON HCL 4 MG/2ML IJ SOLN
INTRAMUSCULAR | Status: AC
  Administered 2018-10-31: 4 mg via INTRAVENOUS
  Filled 2018-10-31: qty 2

## 2018-10-31 MED ORDER — APIXABAN 2.5 MG PO TABS
2.5000 mg | ORAL_TABLET | Freq: Two times a day (BID) | ORAL | Status: DC
  Administered 2018-11-01 – 2018-11-04 (×7): 2.5 mg via ORAL
  Filled 2018-10-31 (×7): qty 1

## 2018-10-31 MED ORDER — HYDROMORPHONE HCL 2 MG PO TABS
2.0000 mg | ORAL_TABLET | ORAL | Status: DC | PRN
  Administered 2018-10-31 – 2018-11-02 (×5): 2 mg via ORAL
  Filled 2018-10-31 (×6): qty 1

## 2018-10-31 MED ORDER — SODIUM CHLORIDE (PF) 0.9 % IJ SOLN
INTRAMUSCULAR | Status: AC
  Filled 2018-10-31: qty 10

## 2018-10-31 MED ORDER — FLEET ENEMA 7-19 GM/118ML RE ENEM: 1.0000 | ENEMA | Freq: Once | RECTAL | Status: DC | PRN

## 2018-10-31 SURGICAL SUPPLY — 61 items
BAG ZIPLOCK 12X15 (MISCELLANEOUS) ×3 IMPLANT
BANDAGE ACE 6X5 VEL STRL LF (GAUZE/BANDAGES/DRESSINGS) ×3 IMPLANT
BLADE SAG 18X100X1.27 (BLADE) ×3 IMPLANT
BLADE SAW SGTL 11.0X1.19X90.0M (BLADE) ×3 IMPLANT
BLADE SURG SZ10 CARB STEEL (BLADE) ×6 IMPLANT
BOWL SMART MIX CTS (DISPOSABLE) ×3 IMPLANT
CEMENT HV SMART SET (Cement) ×6 IMPLANT
CEMENT TIBIA MBT (Knees) ×1 IMPLANT
CLOSURE WOUND 1/2 X4 (GAUZE/BANDAGES/DRESSINGS) ×2
COVER SURGICAL LIGHT HANDLE (MISCELLANEOUS) ×3 IMPLANT
COVER WAND RF STERILE (DRAPES) ×3 IMPLANT
CUFF TOURN SGL QUICK 34 (TOURNIQUET CUFF) ×2
CUFF TRNQT CYL 34X4X40X1 (TOURNIQUET CUFF) ×1 IMPLANT
DECANTER SPIKE VIAL GLASS SM (MISCELLANEOUS) ×6 IMPLANT
DRAPE U-SHAPE 47X51 STRL (DRAPES) ×3 IMPLANT
DRSG ADAPTIC 3X8 NADH LF (GAUZE/BANDAGES/DRESSINGS) ×3 IMPLANT
DRSG PAD ABDOMINAL 8X10 ST (GAUZE/BANDAGES/DRESSINGS) ×3 IMPLANT
DURAPREP 26ML APPLICATOR (WOUND CARE) ×3 IMPLANT
ELECT REM PT RETURN 15FT ADLT (MISCELLANEOUS) ×3 IMPLANT
EVACUATOR 1/8 PVC DRAIN (DRAIN) ×3 IMPLANT
FEMUR SIGMA PS KNEE SZ 4.0N L (Femur) ×3 IMPLANT
GAUZE SPONGE 4X4 12PLY STRL (GAUZE/BANDAGES/DRESSINGS) ×3 IMPLANT
GLOVE BIO SURGEON STRL SZ7 (GLOVE) IMPLANT
GLOVE BIO SURGEON STRL SZ8 (GLOVE) ×3 IMPLANT
GLOVE BIOGEL PI IND STRL 6.5 (GLOVE) ×1 IMPLANT
GLOVE BIOGEL PI IND STRL 7.0 (GLOVE) ×3 IMPLANT
GLOVE BIOGEL PI IND STRL 7.5 (GLOVE) ×2 IMPLANT
GLOVE BIOGEL PI IND STRL 8 (GLOVE) ×1 IMPLANT
GLOVE BIOGEL PI INDICATOR 6.5 (GLOVE) ×2
GLOVE BIOGEL PI INDICATOR 7.0 (GLOVE) ×6
GLOVE BIOGEL PI INDICATOR 7.5 (GLOVE) ×4
GLOVE BIOGEL PI INDICATOR 8 (GLOVE) ×2
GLOVE SURG SS PI 6.5 STRL IVOR (GLOVE) ×3 IMPLANT
GOWN STRL REUS W/TWL LRG LVL3 (GOWN DISPOSABLE) ×3 IMPLANT
GOWN STRL REUS W/TWL XL LVL3 (GOWN DISPOSABLE) ×9 IMPLANT
HANDPIECE INTERPULSE COAX TIP (DISPOSABLE) ×2
HOLDER FOLEY CATH W/STRAP (MISCELLANEOUS) ×3 IMPLANT
IMMOBILIZER KNEE 20 (SOFTGOODS) ×6 IMPLANT
IMMOBILIZER KNEE 20 THIGH 36 (SOFTGOODS) ×1 IMPLANT
MANIFOLD NEPTUNE II (INSTRUMENTS) ×3 IMPLANT
NS IRRIG 1000ML POUR BTL (IV SOLUTION) ×3 IMPLANT
PACK TOTAL KNEE CUSTOM (KITS) ×3 IMPLANT
PAD ABD 8X10 STRL (GAUZE/BANDAGES/DRESSINGS) ×3 IMPLANT
PADDING CAST COTTON 6X4 STRL (CAST SUPPLIES) ×6 IMPLANT
PATELLA DOME PFC 38MM (Knees) ×3 IMPLANT
PIN STEINMAN FIXATION KNEE (PIN) ×3 IMPLANT
PLATE ROT INSERT 15MM SIZE 4 (Plate) ×3 IMPLANT
PROTECTOR NERVE ULNAR (MISCELLANEOUS) ×3 IMPLANT
SET HNDPC FAN SPRY TIP SCT (DISPOSABLE) ×1 IMPLANT
STRIP CLOSURE SKIN 1/2X4 (GAUZE/BANDAGES/DRESSINGS) ×4 IMPLANT
SUT MNCRL AB 4-0 PS2 18 (SUTURE) ×3 IMPLANT
SUT STRATAFIX 0 PDS 27 VIOLET (SUTURE) ×3
SUT VIC AB 2-0 CT1 27 (SUTURE) ×6
SUT VIC AB 2-0 CT1 TAPERPNT 27 (SUTURE) ×3 IMPLANT
SUTURE STRATFX 0 PDS 27 VIOLET (SUTURE) ×1 IMPLANT
TIBIA MBT CEMENT (Knees) ×3 IMPLANT
TRAY FOLEY CATH 14FRSI W/METER (CATHETERS) ×3 IMPLANT
TRAY FOLEY MTR SLVR 16FR STAT (SET/KITS/TRAYS/PACK) IMPLANT
WATER STERILE IRR 1000ML POUR (IV SOLUTION) ×6 IMPLANT
WRAP KNEE MAXI GEL POST OP (GAUZE/BANDAGES/DRESSINGS) ×3 IMPLANT
YANKAUER SUCT BULB TIP 10FT TU (MISCELLANEOUS) ×3 IMPLANT

## 2018-10-31 NOTE — Anesthesia Procedure Notes (Signed)
Procedure Name: MAC Date/Time: 10/31/2018 12:46 PM Performed by: Niel Hummer, CRNA Pre-anesthesia Checklist: Patient identified, Emergency Drugs available, Suction available and Patient being monitored Patient Re-evaluated:Patient Re-evaluated prior to induction Oxygen Delivery Method: Simple face mask

## 2018-10-31 NOTE — Anesthesia Procedure Notes (Signed)
Anesthesia Regional Block: Adductor canal block   Pre-Anesthetic Checklist: ,, timeout performed, Correct Patient, Correct Site, Correct Laterality, Correct Procedure, Correct Position, site marked, Risks and benefits discussed,  Surgical consent,  Pre-op evaluation,  At surgeon's request and post-op pain management  Laterality: Left  Prep: chloraprep       Needles:  Injection technique: Single-shot  Needle Type: Echogenic Stimulator Needle      Needle Gauge: 21     Additional Needles:   Narrative:  Start time: 10/31/2018 11:50 AM End time: 10/31/2018 12:00 PM Injection made incrementally with aspirations every 5 mL.  Performed by: Personally  Anesthesiologist: Lillia Abed, MD  Additional Notes: Monitors applied. Patient sedated. Sterile prep and drape,hand hygiene and sterile gloves were used. Relevant anatomy identified.Needle position confirmed.Local anesthetic injected incrementally after negative aspiration. Local anesthetic spread visualized around nerve(s). Vascular puncture avoided. No complications. Image printed for medical record.The patient tolerated the procedure well.    Lillia Abed MD

## 2018-10-31 NOTE — Anesthesia Postprocedure Evaluation (Signed)
Anesthesia Post Note  Patient: TASHIMA SCARPULLA  Procedure(s) Performed: TOTAL KNEE ARTHROPLASTY (Left Knee)     Patient location during evaluation: PACU Anesthesia Type: Spinal Level of consciousness: oriented and awake and alert Pain management: pain level controlled Vital Signs Assessment: post-procedure vital signs reviewed and stable Respiratory status: spontaneous breathing, respiratory function stable and patient connected to nasal cannula oxygen Cardiovascular status: blood pressure returned to baseline and stable Postop Assessment: no headache, no backache and no apparent nausea or vomiting Anesthetic complications: no    Last Vitals:  Vitals:   10/31/18 1606 10/31/18 1706  BP: 127/60 (!) 119/53  Pulse: (!) 57 62  Resp: 17   Temp: (!) 36.4 C 36.6 C  SpO2: 100% 100%    Last Pain:  Vitals:   10/31/18 1706  TempSrc: Oral  PainSc:                  Tyrian Peart DAVID

## 2018-10-31 NOTE — Discharge Instructions (Signed)
° °Dr. Frank Aluisio °Total Joint Specialist °[facility] °[address] °[place], [place] [address] °[phone] ° °TOTAL KNEE REPLACEMENT POSTOPERATIVE DIRECTIONS ° °Knee Rehabilitation, Guidelines Following Surgery  °Results after knee surgery are often greatly improved when you follow the exercise, range of motion and muscle strengthening exercises prescribed by your doctor. Safety measures are also important to protect the knee from further injury. Any time any of these exercises cause you to have increased pain or swelling in your knee joint, decrease the amount until you are comfortable again and slowly increase them. If you have problems or questions, call your caregiver or physical therapist for advice.  ° °HOME CARE INSTRUCTIONS  °• Remove items at home which could result in a fall. This includes throw rugs or furniture in walking pathways.  °· ICE to the affected knee every three hours for 30 minutes at a time and then as needed for pain and swelling.  Continue to use ice on the knee for pain and swelling from surgery. You may notice swelling that will progress down to the foot and ankle.  This is normal after surgery.  Elevate the leg when you are not up walking on it.   °· Continue to use the breathing machine which will help keep your temperature down.  It is common for your temperature to cycle up and down following surgery, especially at night when you are not up moving around and exerting yourself.  The breathing machine keeps your lungs expanded and your temperature down. °· Do not place pillow under knee, focus on keeping the knee straight while resting ° °DIET °You may resume your previous home diet once your are discharged from the hospital. ° °DRESSING / WOUND CARE / SHOWERING °You may shower 3 days after surgery, but keep the wounds dry during showering.  You may use an occlusive plastic wrap (Press'n Seal for example), NO SOAKING/SUBMERGING IN THE BATHTUB.  If the bandage  gets wet, change with a clean dry gauze.  If the incision gets wet, pat the wound dry with a clean towel. °You may start showering once you are discharged home but do not submerge the incision under water. Just pat the incision dry and apply a dry gauze dressing on daily. °Change the surgical dressing daily and reapply a dry dressing each time. ° °ACTIVITY °Walk with your walker as instructed. °Use walker as long as suggested by your caregivers. °Avoid periods of inactivity such as sitting longer than an hour when not asleep. This helps prevent blood clots.  °You may resume a sexual relationship in one month or when given the OK by your doctor.  °You may return to work once you are cleared by your doctor.  °Do not drive a car for 6 weeks or until released by you surgeon.  °Do not drive while taking narcotics. ° °WEIGHT BEARING °Weight bearing as tolerated with assist device (walker, cane, etc) as directed, use it as long as suggested by your surgeon or therapist, typically at least 4-6 weeks. ° °POSTOPERATIVE CONSTIPATION PROTOCOL °Constipation - defined medically as fewer than three stools per week and severe constipation as less than one stool per week. ° °One of the most common issues patients have following surgery is constipation.  Even if you have a regular bowel pattern at home, your normal regimen is likely to be disrupted due to multiple reasons following surgery.  Combination of anesthesia, postoperative narcotics, change in appetite and fluid intake all can affect your bowels.    In order to avoid complications following surgery, here are some recommendations in order to help you during your recovery period. ° °Colace (docusate) - Pick up an over-the-counter form of Colace or another stool softener and take twice a day as long as you are requiring postoperative pain medications.  Take with a full glass of water daily.  If you experience loose stools or diarrhea, hold the colace until you stool forms back  up.  If your symptoms do not get better within 1 week or if they get worse, check with your doctor. ° °Dulcolax (bisacodyl) - Pick up over-the-counter and take as directed by the product packaging as needed to assist with the movement of your bowels.  Take with a full glass of water.  Use this product as needed if not relieved by Colace only.  ° °MiraLax (polyethylene glycol) - Pick up over-the-counter to have on hand.  MiraLax is a solution that will increase the amount of water in your bowels to assist with bowel movements.  Take as directed and can mix with a glass of water, juice, soda, coffee, or tea.  Take if you go more than two days without a movement. °Do not use MiraLax more than once per day. Call your doctor if you are still constipated or irregular after using this medication for 7 days in a row. ° °If you continue to have problems with postoperative constipation, please contact the office for further assistance and recommendations.  If you experience "the worst abdominal pain ever" or develop nausea or vomiting, please contact the office immediatly for further recommendations for treatment. ° °ITCHING ° If you experience itching with your medications, try taking only a single pain pill, or even half a pain pill at a time.  You can also use Benadryl over the counter for itching or also to help with sleep.  ° °TED HOSE STOCKINGS °Wear the elastic stockings on both legs for three weeks following surgery during the day but you may remove then at night for sleeping. ° °MEDICATIONS °See your medication summary on the “After Visit Summary” that the nursing staff will review with you prior to discharge.  You may have some home medications which will be placed on hold until you complete the course of blood thinner medication.  It is important for you to complete the blood thinner medication as prescribed by your surgeon.  Continue your approved medications as instructed at time of discharge. ° °PRECAUTIONS °If  you experience chest pain or shortness of breath - call 911 immediately for transfer to the hospital emergency department.  °If you develop a fever greater that 101 F, purulent drainage from wound, increased redness or drainage from wound, foul odor from the wound/dressing, or calf pain - CONTACT YOUR SURGEON.   °                                                °FOLLOW-UP APPOINTMENTS °Make sure you keep all of your appointments after your operation with your surgeon and caregivers. You should call the office at the above phone number and make an appointment for approximately two weeks after the date of your surgery or on the date instructed by your surgeon outlined in the "After Visit Summary". ° ° °RANGE OF MOTION AND STRENGTHENING EXERCISES  °Rehabilitation of the knee is important following a knee injury or   an operation. After just a few days of immobilization, the muscles of the thigh which control the knee become weakened and shrink (atrophy). Knee exercises are designed to build up the tone and strength of the thigh muscles and to improve knee motion. Often times heat used for twenty to thirty minutes before working out will loosen up your tissues and help with improving the range of motion but do not use heat for the first two weeks following surgery. These exercises can be done on a training (exercise) mat, on the floor, on a table or on a bed. Use what ever works the best and is most comfortable for you Knee exercises include:  °• Leg Lifts - While your knee is still immobilized in a splint or cast, you can do straight leg raises. Lift the leg to 60 degrees, hold for 3 sec, and slowly lower the leg. Repeat 10-20 times 2-3 times daily. Perform this exercise against resistance later as your knee gets better.  °• Quad and Hamstring Sets - Tighten up the muscle on the front of the thigh (Quad) and hold for 5-10 sec. Repeat this 10-20 times hourly. Hamstring sets are done by pushing the foot backward against an  object and holding for 5-10 sec. Repeat as with quad sets.  °· Leg Slides: Lying on your back, slowly slide your foot toward your buttocks, bending your knee up off the floor (only go as far as is comfortable). Then slowly slide your foot back down until your leg is flat on the floor again. °· Angel Wings: Lying on your back spread your legs to the side as far apart as you can without causing discomfort.  °A rehabilitation program following serious knee injuries can speed recovery and prevent re-injury in the future due to weakened muscles. Contact your doctor or a physical therapist for more information on knee rehabilitation.  ° °IF YOU ARE TRANSFERRED TO A SKILLED REHAB FACILITY °If the patient is transferred to a skilled rehab facility following release from the hospital, a list of the current medications will be sent to the facility for the patient to continue.  When discharged from the skilled rehab facility, please have the facility set up the patient's Home Health Physical Therapy prior to being released. Also, the skilled facility will be responsible for providing the patient with their medications at time of release from the facility to include their pain medication, the muscle relaxants, and their blood thinner medication. If the patient is still at the rehab facility at time of the two week follow up appointment, the skilled rehab facility will also need to assist the patient in arranging follow up appointment in our office and any transportation needs. ° °MAKE SURE YOU:  °• Understand these instructions.  °• Get help right away if you are not doing well or get worse.  ° ° °Pick up stool softner and laxative for home use following surgery while on pain medications. °Do not submerge incision under water. °Please use good hand washing techniques while changing dressing each day. °May shower starting three days after surgery. °Please use a clean towel to pat the incision dry following showers. °Continue to  use ice for pain and swelling after surgery. °Do not use any lotions or creams on the incision until instructed by your surgeon. ° °

## 2018-10-31 NOTE — Transfer of Care (Signed)
Immediate Anesthesia Transfer of Care Note  Patient: [name]  Procedure(s) Performed: TOTAL KNEE ARTHROPLASTY (Left Knee)  Patient Location: PACU  Anesthesia Type:Spinal  Level of Consciousness: awake, alert  and oriented  Airway & Oxygen Therapy: Patient Spontanous Breathing and Patient connected to face mask oxygen  Post-op Assessment: Report given to RN and Post -op Vital signs reviewed and stable  Post vital signs: Reviewed and stable  Last Vitals:  Vitals Value Taken Time  BP 99/58 10/31/2018  2:08 PM  Temp    Pulse 67 10/31/2018  2:11 PM  Resp 14 10/31/2018  2:11 PM  SpO2 96 % 10/31/2018  2:11 PM  Vitals shown include unvalidated device data.  Last Pain:  Vitals:   10/31/18 1221  TempSrc:   PainSc: 0-No pain         Complications: No apparent anesthesia complications

## 2018-10-31 NOTE — Interval H&P Note (Signed)
History and Physical Interval Note:  10/31/2018 10:39 AM  [name]  has presented today for surgery, with the diagnosis of left knee osteoarthritis  The various methods of treatment have been discussed with the patient and family. After consideration of risks, benefits and other options for treatment, the patient has consented to  Procedure(s) with comments: TOTAL KNEE ARTHROPLASTY (Left) - 55min as a surgical intervention .  The patient's history has been reviewed, patient examined, no change in status, stable for surgery.  I have reviewed the patient's chart and labs.  Questions were answered to the patient's satisfaction.     Pilar Plate Alnita Aybar

## 2018-10-31 NOTE — Progress Notes (Signed)
AssistedDr. Ossey with left, ultrasound guided, adductor canal block. Side rails up, monitors on throughout procedure. See vital signs in flow sheet. Tolerated Procedure well.  

## 2018-10-31 NOTE — Op Note (Signed)
OPERATIVE REPORT-TOTAL KNEE ARTHROPLASTY   Pre-operative diagnosis- Osteoarthritis  Left knee(s)  Post-operative diagnosis- Osteoarthritis Left knee(s)  Procedure-  Left  Total Knee Arthroplasty  Surgeon- Dione Plover. Meredith Mells, MD  Assistant- Ardeen Jourdain, PA-C   Anesthesia-  Adductor canal block and spinal  EBL-20 mL   Drains Hemovac  Tourniquet time-  Total Tourniquet Time Documented: Thigh (Left) - 33 minutes Total: Thigh (Left) - 33 minutes     Complications- None  Condition-PACU - hemodynamically stable.   Brief Clinical Note  [name] is a 73 y.o. year old female with end stage OA of her left knee with progressively worsening pain and dysfunction. She has constant pain, with activity and at rest and significant functional deficits with difficulties even with ADLs. She has had extensive non-op management including analgesics, injections of cortisone and viscosupplements, and home exercise program, but remains in significant pain with significant dysfunction. Radiographs show bone on bone arthritis medial and patellofemoral. She presents now for left Total Knee Arthroplasty.    Procedure in detail---   The patient is brought into the operating room and positioned supine on the operating table. After successful administration of  Adductor canal block and spinal,   a tourniquet is placed high on the  Left thigh(s) and the lower extremity is prepped and draped in the usual sterile fashion. Time out is performed by the operating team and then the  Left lower extremity is wrapped in Esmarch, knee flexed and the tourniquet inflated to 300 mmHg.       A midline incision is made with a ten blade through the subcutaneous tissue to the level of the extensor mechanism. A fresh blade is used to make a medial parapatellar arthrotomy. Soft tissue over the proximal medial tibia is subperiosteally elevated to the joint line with a knife and into the semimembranosus bursa with a Cobb  elevator. Soft tissue over the proximal lateral tibia is elevated with attention being paid to avoiding the patellar tendon on the tibial tubercle. The patella is everted, knee flexed 90 degrees and the ACL and PCL are removed. Findings are bone on bone medial and patellofemoral with large global osteophytes.        The drill is used to create a starting hole in the distal femur and the canal is thoroughly irrigated with sterile saline to remove the fatty contents. The 5 degree Left  valgus alignment guide is placed into the femoral canal and the distal femoral cutting block is pinned to remove 10 mm off the distal femur. Resection is made with an oscillating saw.      The tibia is subluxed forward and the menisci are removed. The extramedullary alignment guide is placed referencing proximally at the medial aspect of the tibial tubercle and distally along the second metatarsal axis and tibial crest. The block is pinned to remove 83mm off the more deficient medial  side. Resection is made with an oscillating saw. Size 3is the most appropriate size for the tibia and the proximal tibia is prepared with the modular drill and keel punch for that size.      The femoral sizing guide is placed and size 4 is most appropriate. Rotation is marked off the epicondylar axis and confirmed by creating a rectangular flexion gap at 90 degrees. The size 4 cutting block is pinned in this rotation and the anterior, posterior and chamfer cuts are made with the oscillating saw. The intercondylar block is then placed and that cut is made.  Trial size 3 tibial component, trial size 4 narrow posterior stabilized femur and a 15  mm posterior stabilized rotating platform insert trial is placed. Full extension is achieved with excellent varus/valgus and anterior/posterior balance throughout full range of motion. The patella is everted and thickness measured to be 24  mm. Free hand resection is taken to 14 mm, a 38 template is placed, lug  holes are drilled, trial patella is placed, and it tracks normally. Osteophytes are removed off the posterior femur with the trial in place. All trials are removed and the cut bone surfaces prepared with pulsatile lavage. Cement is mixed and once ready for implantation, the size 3 tibial implant, size  4 narrow posterior stabilized femoral component, and the size 38 patella are cemented in place and the patella is held with the clamp. The trial insert is placed and the knee held in full extension. The Exparel (20 ml mixed with 60 ml saline) is injected into the extensor mechanism, posterior capsule, medial and lateral gutters and subcutaneous tissues.  All extruded cement is removed and once the cement is hard the permanent 15 mm posterior stabilized rotating platform insert is placed into the tibial tray.      The wound is copiously irrigated with saline solution and the extensor mechanism closed over a hemovac drain with #1 V-loc suture. The tourniquet is released for a total tourniquet time of 33  minutes. Flexion against gravity is 140 degrees and the patella tracks normally. Subcutaneous tissue is closed with 2.0 vicryl and subcuticular with running 4.0 Monocryl. The incision is cleaned and dried and steri-strips and a bulky sterile dressing are applied. The limb is placed into a knee immobilizer and the patient is awakened and transported to recovery in stable condition.      Please note that a surgical assistant was a medical necessity for this procedure in order to perform it in a safe and expeditious manner. Surgical assistant was necessary to retract the ligaments and vital neurovascular structures to prevent injury to them and also necessary for proper positioning of the limb to allow for anatomic placement of the prosthesis.   [name], MD    10/31/2018, 1:49 PM

## 2018-11-01 ENCOUNTER — Encounter (HOSPITAL_COMMUNITY): Payer: Self-pay | Admitting: Orthopedic Surgery

## 2018-11-01 LAB — BASIC METABOLIC PANEL
Anion gap: 7 (ref 5–15)
BUN: 23 mg/dL (ref 8–23)
CO2: 21 mmol/L — ABNORMAL LOW (ref 22–32)
Calcium: 8.2 mg/dL — ABNORMAL LOW (ref 8.9–10.3)
Chloride: 105 mmol/L (ref 98–111)
Creatinine, Ser: 1 mg/dL (ref 0.44–1.00)
GFR calc Af Amer: >60 mL/min (ref >60)
GFR calc non Af Amer: 55 mL/min — ABNORMAL LOW (ref >60)
Glucose, Bld: 186 mg/dL — ABNORMAL HIGH (ref 70–99)
Potassium: 3.9 mmol/L (ref 3.5–5.1)
Sodium: 133 mmol/L — ABNORMAL LOW (ref 135–145)

## 2018-11-01 LAB — GLUCOSE, CAPILLARY
GLUCOSE-CAPILLARY: 225 mg/dL — AB (ref 70–99)
Glucose-Capillary: 119 mg/dL — ABNORMAL HIGH (ref 70–99)
Glucose-Capillary: 142 mg/dL — ABNORMAL HIGH (ref 70–99)
Glucose-Capillary: 181 mg/dL — ABNORMAL HIGH (ref 70–99)

## 2018-11-01 LAB — CBC
HCT: 28.9 % — ABNORMAL LOW (ref 36.0–46.0)
Hemoglobin: 9.3 g/dL — ABNORMAL LOW (ref 12.0–15.0)
MCH: 31.6 pg (ref 26.0–34.0)
MCHC: 32.2 g/dL (ref 30.0–36.0)
MCV: 98.3 fL (ref 80.0–100.0)
Platelets: 203 10*3/uL (ref 150–400)
RBC: 2.94 MIL/uL — ABNORMAL LOW (ref 3.87–5.11)
RDW: 12.2 % (ref 11.5–15.5)
WBC: 9.9 10*3/uL (ref 4.0–10.5)
nRBC: 0 % (ref 0.0–0.2)

## 2018-11-01 MED ORDER — SODIUM CHLORIDE 0.9 % IV BOLUS
250.0000 mL | Freq: Once | INTRAVENOUS | Status: AC
  Administered 2018-11-01: 07:00:00 via INTRAVENOUS

## 2018-11-01 NOTE — Progress Notes (Signed)
Physical Therapy Treatment Patient Details Name: [name] MRN: [id number] DOB: 02/06/1946 Today's Date: 11/01/2018    History of Present Illness L TKA, PMH:  R TKA June 2019, manipulation R knee August 2019    PT Comments    Pt reporting severe L knee and thigh pain. She stated she can't tolerate narcotics due to nausea and confusion. Pt had tylenol prior to PT session.  She c/o severe pain with all movement of LLE. Performed L TKA exercises, AAROM L knee 10-30*. Assisted from recliner to bed, pt unable to tolerate ambulation 2* pain. Pt progressing slowly.    Follow Up Recommendations  Follow surgeon's recommendation for DC plan and follow-up therapies     Equipment Recommendations  None recommended by PT    Recommendations for Other Services       Precautions / Restrictions Precautions Precautions: Knee Precaution Booklet Issued: Yes (comment) Precaution Comments: reviewed no pillow under knee Restrictions Weight Bearing Restrictions: No Other Position/Activity Restrictions: WBAT    Mobility  Bed Mobility Overal bed mobility: Needs Assistance Bed Mobility: Sit to Supine     Supine to sit: Min assist Sit to supine: Min assist   General bed mobility comments: min A for LLE in to bed  Transfers Overall transfer level: Needs assistance Equipment used: Rolling walker (2 wheeled) Transfers: Sit to/from [facility] Sit to Stand: Min assist Stand pivot transfers: Min assist       General transfer comment: VCs hand placement, min A to rise/steady    Stairs             Wheelchair Mobility    Modified Rankin (Stroke Patients Only)       Balance Overall balance assessment: Needs assistance   Sitting balance-Leahy Scale: Fair     Standing balance support: Bilateral upper extremity supported Standing balance-Leahy Scale: Poor Standing balance comment: relies on BUE support                            Cognition  Arousal/Alertness: Awake/alert Behavior During Therapy: WFL for tasks assessed/performed Overall Cognitive Status: Within Functional Limits for tasks assessed                                        Exercises Total Joint Exercises Ankle Circles/Pumps: AROM;Both;10 reps;Supine Quad Sets: AROM;Left;Supine;10 reps Short Arc QuadSinclair Ship;Left;10 reps;Supine Heel Slides: AAROM;Left;10 reps;Supine Straight Leg Raises: AAROM;Left;10 reps;Supine Goniometric ROM: 10-30* AAROM L knee    General Comments        Pertinent Vitals/Pain Pain Assessment: 0-10 Pain Score: 10-Worst pain ever Pain Location: R knee Pain Descriptors / Indicators: Sore;Crying;Guarding;Grimacing;Moaning Pain Intervention(s): Limited activity within patient's tolerance;Monitored during session;Premedicated before session;Ice applied    Home Living Family/patient expects to be discharged to:: Private residence Living Arrangements: Spouse/significant other Available Help at Discharge: Family Type of Home: House Home Access: Arial: One Wilsey: Environmental consultant - 4 wheels;Walker - standard;Grab bars - toilet;Grab bars - tub/shower;Toilet riser      Prior Function Level of Independence: Independent          PT Goals (current goals can now be found in the care plan section) Acute Rehab PT Goals Patient Stated Goal: to be able to travel PT Goal Formulation: With patient/family Time For Goal Achievement: 11/08/18 Potential to Achieve Goals: Fair Progress towards PT  goals: Not progressing toward goals - comment(limited by pain)    Frequency    7X/week      PT Plan Current plan remains appropriate    Co-evaluation              AM-PAC PT "6 Clicks" Mobility   Outcome Measure  Help needed turning from your back to your side while in a flat bed without using bedrails?: A Little Help needed moving from lying on your back to sitting on the side of a flat bed  without using bedrails?: A Little Help needed moving to and from a bed to a chair (including a wheelchair)?: A Little Help needed standing up from a chair using your arms (e.g., wheelchair or bedside chair)?: A Little Help needed to walk in hospital room?: A Lot Help needed climbing 3-5 steps with a railing? : A Lot 6 Click Score: 16    End of Session Equipment Utilized During Treatment: Gait belt Activity Tolerance: Patient limited by pain Patient left: with call bell/phone within reach;with family/visitor present;in bed Nurse Communication: Mobility status PT Visit Diagnosis: Difficulty in walking, not elsewhere classified (R26.2);Pain Pain - Right/Left: Left Pain - part of body: Knee     Time: 3888-7579 PT Time Calculation (min) (ACUTE ONLY): 18 min  Charges:  $Gait Training: 8-22 mins $Therapeutic Exercise: 8-22 mins                     Blondell Reveal Kistler PT 11/01/2018  Acute Rehabilitation Services Pager [phone] Office [phone]

## 2018-11-01 NOTE — Progress Notes (Signed)
Subjective: 1 Day Post-Op Procedure(s) (LRB): TOTAL KNEE ARTHROPLASTY (Left) Patient reports pain as moderate.   Patient seen in rounds by Dr. Wynelle Link. Patient is well, and has had no acute complaints or problems other than pain in the left knee. Denies chest pain, SOB, or calf pain. Foley catheter removed this AM. No issues overnight.  We will start therapy today.   Objective: Vital signs in last 24 hours: Temp:  [97.4 F (36.3 C)-98.4 F (36.9 C)] 97.6 F (36.4 C) (01/28 0514) Pulse Rate:  [56-90] 74 (01/28 0514) Resp:  [9-21] 16 (01/28 0514) BP: (99-127)/(53-73) 109/60 (01/28 0514) SpO2:  [95 %-100 %] 98 % (01/28 0514) Weight:  [80.4 kg-80.5 kg] 80.4 kg (01/27 1111)  Intake/Output from previous day:  Intake/Output Summary (Last 24 hours) at 11/01/2018 0748 Last data filed at 11/01/2018 0600 Gross per 24 hour  Intake 3565.07 ml  Output 1250 ml  Net 2315.07 ml     Labs: Recent Labs    11/01/18 0345  HGB 9.3*   Recent Labs    11/01/18 0345  WBC 9.9  RBC 2.94*  HCT 28.9*  PLT 203   Recent Labs    11/01/18 0345  NA 133*  K 3.9  CL 105  CO2 21*  BUN 23  CREATININE 1.00  GLUCOSE 186*  CALCIUM 8.2*   Exam: General - Patient is Alert and Oriented Extremity - Neurologically intact Neurovascular intact Sensation intact distally Dorsiflexion/Plantar flexion intact Dressing - dressing C/D/I Motor Function - intact, moving foot and toes well on exam.   Past Medical History:  Diagnosis Date  . Allergy history unknown   . Arthritis    oa  . Asthma    takes acolate for  . Breast cancer ([place]) 1999; 2008   hx of bilateral breast cancer  . Breast cancer, left breast ([place]) 10/27/2012   3.2 cm ER positive, Her-2 negative, 1 node positive S/P mastectomy/TTRAM reconstruction 4/08  . Breast cancer, right breast ([place]) 10/27/2012   3.2 cm ER/PR positive, 1 node positive, Her-2 negative Rx w mastectomy, AC -T chemo, followed by aromatase inhibitor x 5 years dx 4/08   . Cancer [facility])    breast CA  . Dehydration after exertion    after she went berry picking in the summer heat . see ED visit   . Diabetes ([place])   . DM2 (diabetes mellitus, type 2) ([place])    on welchol for  . Dyspnea    with exertion  . Elevated cholesterol   . GERD (gastroesophageal reflux disease)   . Hiatal hernia   . Hypertension   . LBBB (left bundle branch block) 10/27/2012  . Leg cramps   . NICM (nonischemic cardiomyopathy) ([place])    a. Echo:  06/14/12: Poor endocardial definition, possible septal HK, EF 50-55%, normal wall motion, mild LAE ;  b.  [facility] 9/13:  normal cors, EF 35-40%,  c. Echo 7/14: EF 50-55%, mild LAE  . Other fatigue 01/01/2015  . Pulmonary embolus ([place]) 2001   after tram flap surgery    Assessment/Plan: 1 Day Post-Op Procedure(s) (LRB): TOTAL KNEE ARTHROPLASTY (Left) Principal Problem:   OA (osteoarthritis) of knee  Estimated body mass index is 32.44 kg/m as calculated from the following:   Height as of this encounter: 5' 2"  (1.575 m).   Weight as of this encounter: 80.4 kg. Advance diet Up with therapy  Anticipated LOS equal to or greater than 2 midnights due to - Age 32 and older with one  or more of the following:  - Obesity  - Expected need for hospital services (PT, OT, Nursing) required for safe  discharge  - Anticipated need for postoperative skilled nursing care or inpatient rehab  - Active co-morbidities: Diabetes and DVT/VTE OR   - Unanticipated findings during/Post Surgery: None  - Patient is a high risk of re-admission due to: None    DVT Prophylaxis - Eliquis Weight bearing as tolerated. D/C O2 and pulse ox and try on room air. Hemovac pulled without difficulty, will begin therapy today.  Plan is to go Home after hospital stay. Plan for discharge tomorrow pending progress with therapy.  Theresa Duty, PA-C Orthopedic Surgery 11/01/2018, 7:48 AM

## 2018-11-01 NOTE — Evaluation (Signed)
Physical Therapy Evaluation Patient Details Name: [name] MRN: [id number] DOB: Jul 13, 1946 Today's Date: 11/01/2018   History of Present Illness  L TKA, PMH:  R TKA June 2019, manipulation R knee August 2019  Clinical Impression  Pt is s/p TKA resulting in the deficits listed below (see PT Problem List). Pt ambulated 10' with RW, distance limited by L knee pain. Initiated L TKA HEP. L knee AAROM 10-30*, limited by pain. Pt will benefit from skilled PT to increase their independence and safety with mobility to allow discharge to the venue listed below.      Follow Up Recommendations Follow surgeon's recommendation for DC plan and follow-up therapies    Equipment Recommendations  None recommended by PT    Recommendations for Other Services       Precautions / Restrictions Precautions Precautions: Knee Precaution Booklet Issued: Yes (comment) Precaution Comments: reviewed no pillow under knee Restrictions Weight Bearing Restrictions: No Other Position/Activity Restrictions: WBAT      Mobility  Bed Mobility Overal bed mobility: Needs Assistance Bed Mobility: Supine to Sit     Supine to sit: Min assist     General bed mobility comments: min A for LLE OOB  Transfers Overall transfer level: Needs assistance Equipment used: Rolling walker (2 wheeled) Transfers: Sit to/from [facility] Sit to Stand: Min assist Stand pivot transfers: Min assist       General transfer comment: VCs hand placement, min A to rise/steady  Ambulation/Gait Ambulation/Gait assistance: Min guard Gait Distance (Feet): 10 Feet Assistive device: Rolling walker (2 wheeled) Gait Pattern/deviations: Step-to pattern;Antalgic;Trunk flexed Gait velocity: decr   General Gait Details: VCs sequencing, distance limited by L knee pain  Stairs            Wheelchair Mobility    Modified Rankin (Stroke Patients Only)       Balance Overall balance assessment: Needs  assistance   Sitting balance-Leahy Scale: Fair     Standing balance support: Bilateral upper extremity supported Standing balance-Leahy Scale: Poor Standing balance comment: relies on BUE support                             Pertinent Vitals/Pain Pain Assessment: 0-10 Pain Score: 7  Pain Location: R knee Pain Descriptors / Indicators: Sore Pain Intervention(s): Limited activity within patient's tolerance;Monitored during session;Premedicated before session;Ice applied    Home Living Family/patient expects to be discharged to:: Private residence Living Arrangements: Spouse/significant other Available Help at Discharge: Family Type of Home: House Home Access: [place]: One [place]: Environmental consultant - 4 wheels;Walker - standard;Grab bars - toilet;Grab bars - tub/shower;Toilet riser      Prior Function Level of Independence: Independent               Hand Dominance        Extremity/Trunk Assessment   Upper Extremity Assessment Upper Extremity Assessment: Overall WFL for tasks assessed    Lower Extremity Assessment Lower Extremity Assessment: LLE deficits/detail LLE Deficits / Details: 10-30* AAROM L knee, 2/5 SLR LLE Sensation: WNL       Communication   Communication: No difficulties  Cognition Arousal/Alertness: Awake/alert Behavior During Therapy: WFL for tasks assessed/performed Overall Cognitive Status: Within Functional Limits for tasks assessed  General Comments      Exercises Total Joint Exercises Ankle Circles/Pumps: AROM;Both;10 reps;Supine Quad Sets: AROM;Left;5 reps;Supine Heel Slides: AAROM;Left;10 reps;Supine Straight Leg Raises: AAROM;Left;10 reps;Supine Goniometric ROM: 10-30* AAROM L knee   Assessment/Plan    PT Assessment Patient needs continued PT services  PT Problem List Decreased range of motion;Decreased strength;Decreased activity  tolerance;Decreased mobility;Decreased balance;Pain       PT Treatment Interventions Gait training;Therapeutic exercise;Patient/family education;Therapeutic activities    PT Goals (Current goals can be found in the Care Plan section)  Acute Rehab PT Goals Patient Stated Goal: to be able to travel PT Goal Formulation: With patient/family Time For Goal Achievement: 11/08/18 Potential to Achieve Goals: Fair    Frequency 7X/week   Barriers to discharge        Co-evaluation               AM-PAC PT "6 Clicks" Mobility  Outcome Measure Help needed turning from your back to your side while in a flat bed without using bedrails?: A Little Help needed moving from lying on your back to sitting on the side of a flat bed without using bedrails?: A Little Help needed moving to and from a bed to a chair (including a wheelchair)?: A Little Help needed standing up from a chair using your arms (e.g., wheelchair or bedside chair)?: A Little Help needed to walk in hospital room?: A Lot Help needed climbing 3-5 steps with a railing? : A Lot 6 Click Score: 16    End of Session Equipment Utilized During Treatment: Gait belt Activity Tolerance: Patient limited by pain Patient left: in chair;with call bell/phone within reach;with family/visitor present Nurse Communication: Mobility status PT Visit Diagnosis: Difficulty in walking, not elsewhere classified (R26.2);Pain Pain - Right/Left: Left Pain - part of body: Knee    Time: 1128-1202 PT Time Calculation (min) (ACUTE ONLY): 34 min   Charges:   PT Evaluation $PT Eval Low Complexity: 1 Low PT Treatments $Gait Training: 8-22 mins       Blondell Reveal Kistler PT 11/01/2018  Acute Rehabilitation Services Pager [phone] Office [phone]

## 2018-11-02 LAB — BASIC METABOLIC PANEL
Anion gap: 8 (ref 5–15)
BUN: 16 mg/dL (ref 8–23)
CHLORIDE: 102 mmol/L (ref 98–111)
CO2: 24 mmol/L (ref 22–32)
CREATININE: 0.79 mg/dL (ref 0.44–1.00)
Calcium: 8.7 mg/dL — ABNORMAL LOW (ref 8.9–10.3)
GFR calc Af Amer: >60 mL/min (ref >60)
GFR calc non Af Amer: >60 mL/min (ref >60)
Glucose, Bld: 122 mg/dL — ABNORMAL HIGH (ref 70–99)
Potassium: 3.7 mmol/L (ref 3.5–5.1)
Sodium: 134 mmol/L — ABNORMAL LOW (ref 135–145)

## 2018-11-02 LAB — CBC
HCT: 29.5 % — ABNORMAL LOW (ref 36.0–46.0)
HEMOGLOBIN: 9.4 g/dL — AB (ref 12.0–15.0)
MCH: 31.6 pg (ref 26.0–34.0)
MCHC: 31.9 g/dL (ref 30.0–36.0)
MCV: 99.3 fL (ref 80.0–100.0)
Platelets: 187 10*3/uL (ref 150–400)
RBC: 2.97 MIL/uL — ABNORMAL LOW (ref 3.87–5.11)
RDW: 12.7 % (ref 11.5–15.5)
WBC: 13.1 10*3/uL — ABNORMAL HIGH (ref 4.0–10.5)
nRBC: 0 % (ref 0.0–0.2)

## 2018-11-02 LAB — GLUCOSE, CAPILLARY
Glucose-Capillary: 103 mg/dL — ABNORMAL HIGH (ref 70–99)
Glucose-Capillary: 116 mg/dL — ABNORMAL HIGH (ref 70–99)
Glucose-Capillary: 135 mg/dL — ABNORMAL HIGH (ref 70–99)
Glucose-Capillary: 157 mg/dL — ABNORMAL HIGH (ref 70–99)

## 2018-11-02 MED ORDER — GABAPENTIN 300 MG PO CAPS: 300.0000 mg | ORAL_CAPSULE | Freq: Three times a day (TID) | ORAL | 0 refills | Status: DC

## 2018-11-02 MED ORDER — TRAMADOL HCL 50 MG PO TABS: 50.0000 mg | ORAL_TABLET | Freq: Four times a day (QID) | ORAL | 0 refills | Status: DC | PRN

## 2018-11-02 MED ORDER — HYDROMORPHONE HCL 2 MG PO TABS: 2.0000 mg | ORAL_TABLET | Freq: Four times a day (QID) | ORAL | 0 refills | Status: DC | PRN

## 2018-11-02 MED ORDER — ACETAMINOPHEN 500 MG PO TABS
1000.0000 mg | ORAL_TABLET | Freq: Four times a day (QID) | ORAL | Status: DC | PRN
  Administered 2018-11-02 – 2018-11-04 (×4): 1000 mg via ORAL
  Filled 2018-11-02 (×4): qty 2

## 2018-11-02 MED ORDER — METHOCARBAMOL 500 MG PO TABS: 500.0000 mg | ORAL_TABLET | Freq: Four times a day (QID) | ORAL | 0 refills | Status: DC | PRN

## 2018-11-02 NOTE — Progress Notes (Signed)
Physical Therapy Treatment Patient Details Name: [name] MRN: [id number] DOB: 09-03-1946 Today's Date: 11/02/2018    History of Present Illness L TKA, PMH:  R TKA June 2019, manipulation R knee August 2019    PT Comments    Pt tolerated increased ambulation distance of 90' with RW this afternoon. She performed TKA exercises with min assist. Pt is somewhat lethargic, she believes this is due to pain medication. Pt is gradually progressing with mobility.   Follow Up Recommendations  Follow surgeon's recommendation for DC plan and follow-up therapies     Equipment Recommendations  None recommended by PT    Recommendations for Other Services       Precautions / Restrictions Precautions Precautions: Knee Precaution Booklet Issued: Yes (comment) Precaution Comments: reviewed no pillow under knee Restrictions Weight Bearing Restrictions: No Other Position/Activity Restrictions: WBAT    Mobility  Bed Mobility Overal bed mobility: Needs Assistance Bed Mobility: Sit to Supine       Sit to supine: Min assist   General bed mobility comments: min A for LLE into bed  Transfers Overall transfer level: Needs assistance Equipment used: Rolling walker (2 wheeled) Transfers: Sit to/from Stand Sit to Stand: Min guard         General transfer comment: VCs hand placement  Ambulation/Gait Ambulation/Gait assistance: Min guard Gait Distance (Feet): 60 Feet Assistive device: Rolling walker (2 wheeled) Gait Pattern/deviations: Step-to pattern;Antalgic;Trunk flexed Gait velocity: decr   General Gait Details: increased time, good sequencing, no buckling   Stairs             Wheelchair Mobility    Modified Rankin (Stroke Patients Only)       Balance Overall balance assessment: Needs assistance   Sitting balance-Leahy Scale: Good     Standing balance support: Bilateral upper extremity supported Standing balance-Leahy Scale: Poor Standing balance  comment: relies on BUE support                            Cognition Arousal/Alertness: Lethargic;Suspect due to medications Behavior During Therapy: [facility] for tasks assessed/performed Overall Cognitive Status: Within Functional Limits for tasks assessed                                        Exercises Total Joint Exercises Ankle Circles/Pumps: AROM;Both;10 reps;Supine Quad Sets: AROM;Left;Supine;10 reps Short Arc QuadSinclair Ship;Left;10 reps;Supine Heel Slides: AAROM;Left;10 reps;Supine Hip ABduction/ADduction: AAROM;Left;10 reps;Supine Straight Leg Raises: AAROM;Left;Supine;10 reps Goniometric ROM: 10-40* AAROM L knee    General Comments        Pertinent Vitals/Pain Pain Score: 7  Pain Location: R knee Pain Descriptors / Indicators: Sore;Grimacing Pain Intervention(s): Limited activity within patient's tolerance;Monitored during session;Premedicated before session;Ice applied    Home Living                      Prior Function            PT Goals (current goals can now be found in the care plan section) Acute Rehab PT Goals Patient Stated Goal: to be able to travel PT Goal Formulation: With patient Time For Goal Achievement: 11/08/18 Potential to Achieve Goals: Fair Progress towards PT goals: Progressing toward goals    Frequency    7X/week      PT Plan Current plan remains appropriate    Co-evaluation  AM-PAC PT "6 Clicks" Mobility   Outcome Measure  Help needed turning from your back to your side while in a flat bed without using bedrails?: A Little Help needed moving from lying on your back to sitting on the side of a flat bed without using bedrails?: A Little Help needed moving to and from a bed to a chair (including a wheelchair)?: A Little Help needed standing up from a chair using your arms (e.g., wheelchair or bedside chair)?: A Little Help needed to walk in hospital room?: A Little Help needed  climbing 3-5 steps with a railing? : A Lot 6 Click Score: 17    End of Session Equipment Utilized During Treatment: Gait belt Activity Tolerance: Patient limited by pain Patient left: with call bell/phone within reach Nurse Communication: Mobility status PT Visit Diagnosis: Difficulty in walking, not elsewhere classified (R26.2);Pain Pain - Right/Left: Left Pain - part of body: Knee     Time: 1440-1501 PT Time Calculation (min) (ACUTE ONLY): 21 min  Charges:  $Gait Training: 8-22 mins                    Blondell Reveal Kistler PT 11/02/2018  Acute Rehabilitation Services Pager [phone] Office [phone]

## 2018-11-02 NOTE — Progress Notes (Signed)
Physical Therapy Treatment Patient Details Name: ANGELYNN LEMUS MRN: [id number] DOB: 1946-07-24 Today's Date: 11/02/2018    History of Present Illness L TKA, PMH:  R TKA June 2019, manipulation R knee August 2019    PT Comments    Pt tolerated increased ambulation distance of 66' with RW. Performed TKA exercises with min assist. Pt c/o severe 10/10 pain with all movement.  Discussed importance of mobility in order to avoid another knee manipulation. Pt stated, "I think I'll probably have to have another one" (manipulation).    Follow Up Recommendations  Follow surgeon's recommendation for DC plan and follow-up therapies     Equipment Recommendations  None recommended by PT    Recommendations for Other Services       Precautions / Restrictions Precautions Precautions: Knee Precaution Booklet Issued: Yes (comment) Precaution Comments: reviewed no pillow under knee Restrictions Weight Bearing Restrictions: No Other Position/Activity Restrictions: WBAT    Mobility  Bed Mobility Overal bed mobility: Needs Assistance Bed Mobility: Supine to Sit     Supine to sit: Min assist     General bed mobility comments: min A for LLE   Transfers Overall transfer level: Needs assistance Equipment used: Rolling walker (2 wheeled) Transfers: Sit to/from Stand Sit to Stand: Min assist         General transfer comment: VCs hand placement, min A to rise/steady  Ambulation/Gait Ambulation/Gait assistance: Min guard Gait Distance (Feet): 32 Feet Assistive device: Rolling walker (2 wheeled) Gait Pattern/deviations: Step-to pattern;Antalgic;Trunk flexed Gait velocity: decr   General Gait Details: increased time, good sequencing, no buckling   Stairs             Wheelchair Mobility    Modified Rankin (Stroke Patients Only)       Balance Overall balance assessment: Needs assistance   Sitting balance-Leahy Scale: Good     Standing balance support: Bilateral upper  extremity supported Standing balance-Leahy Scale: Poor Standing balance comment: relies on BUE support                            Cognition Arousal/Alertness: Awake/alert Behavior During Therapy: WFL for tasks assessed/performed Overall Cognitive Status: Within Functional Limits for tasks assessed                                        Exercises Total Joint Exercises Ankle Circles/Pumps: AROM;Both;10 reps;Supine Quad Sets: AROM;Left;Supine;10 reps Short Arc QuadSinclair Ship;Left;10 reps;Supine Heel Slides: AAROM;Left;10 reps;Supine Hip ABduction/ADduction: AAROM;Left;10 reps;Supine Straight Leg Raises: AAROM;Left;Supine;5 reps Knee Flexion: AAROM;Left;15 reps;Seated Goniometric ROM: 10-35* aarom L knee    General Comments        Pertinent Vitals/Pain Pain Score: 10-Worst pain ever Pain Location: R knee Pain Descriptors / Indicators: Sore;Crying;Guarding;Grimacing;Moaning Pain Intervention(s): Limited activity within patient's tolerance;Monitored during session;Premedicated before session;Ice applied    Home Living                      Prior Function            PT Goals (current goals can now be found in the care plan section) Acute Rehab PT Goals Patient Stated Goal: to be able to travel PT Goal Formulation: With patient Time For Goal Achievement: 11/08/18 Potential to Achieve Goals: Fair Progress towards PT goals: Progressing toward goals    Frequency    7X/week  PT Plan Current plan remains appropriate    Co-evaluation              AM-PAC PT "6 Clicks" Mobility   Outcome Measure  Help needed turning from your back to your side while in a flat bed without using bedrails?: A Little Help needed moving from lying on your back to sitting on the side of a flat bed without using bedrails?: A Little Help needed moving to and from a bed to a chair (including a wheelchair)?: A Little Help needed standing up from a chair  using your arms (e.g., wheelchair or bedside chair)?: A Little Help needed to walk in hospital room?: A Little Help needed climbing 3-5 steps with a railing? : A Lot 6 Click Score: 17    End of Session Equipment Utilized During Treatment: Gait belt Activity Tolerance: Patient limited by pain Patient left: with call bell/phone within reach;in chair Nurse Communication: Mobility status PT Visit Diagnosis: Difficulty in walking, not elsewhere classified (R26.2);Pain Pain - Right/Left: Left Pain - part of body: Knee     Time: 0923-3007 PT Time Calculation (min) (ACUTE ONLY): 28 min  Charges:  $Gait Training: 8-22 mins $Therapeutic Exercise: 8-22 mins                    Blondell Reveal Kistler PT 11/02/2018  Acute Rehabilitation Services Pager [phone] Office [phone]

## 2018-11-02 NOTE — Care Management Note (Signed)
Case Management Note  Patient Details  Name: [name] MRN: [id number] Date of Birth: 14-Jul-1946  Subjective/Objective:    Spoke with patient at bedside. Confirmed plan for OP PT, already arranged. Has RW and 3n1. [phone]                Action/Plan:   Expected Discharge Date:  11/01/18               Expected Discharge Plan:  OP Rehab  In-House Referral:  NA  Discharge planning Services  CM Consult  Post Acute Care Choice:  NA Choice offered to:  Patient  DME Arranged:  N/A DME Agency:  NA  HH Arranged:  NA HH Agency:  NA  Status of Service:  Completed, signed off  If discussed at [place] of Stay Meetings, dates discussed:    Additional Comments:  Guadalupe Maple, RN 11/02/2018, 10:23 AM

## 2018-11-02 NOTE — Progress Notes (Signed)
Subjective: 2 Days Post-Op Procedure(s) (LRB): TOTAL KNEE ARTHROPLASTY (Left) Patient reports pain as moderate.   Patient seen in rounds by Dr. Wynelle Link. Patient is well, and has had no acute complaints or problems other than pain in the left knee. Denies chest pain, SOB, or calf pain. Voiding without difficulty and positive flatus. No issues overnight.  Plan is to go Home after hospital stay.  Objective: Vital signs in last 24 hours: Temp:  [97.5 F (36.4 C)-98.2 F (36.8 C)] 97.7 F (36.5 C) (01/29 0505) Pulse Rate:  [64-75] 75 (01/29 0505) Resp:  [14-16] 14 (01/29 0505) BP: (122-140)/(60-69) 133/60 (01/29 0505) SpO2:  [95 %-100 %] 97 % (01/29 0505)  Intake/Output from previous day:  Intake/Output Summary (Last 24 hours) at 11/02/2018 0746 Last data filed at 11/02/2018 0600 Gross per 24 hour  Intake 1675.35 ml  Output 2300 ml  Net -624.65 ml    Labs: Recent Labs    11/01/18 0345 11/02/18 0558  HGB 9.3* 9.4*   Recent Labs    11/01/18 0345 11/02/18 0558  WBC 9.9 13.1*  RBC 2.94* 2.97*  HCT 28.9* 29.5*  PLT 203 187   Recent Labs    11/01/18 0345 11/02/18 0558  NA 133* 134*  K 3.9 3.7  CL 105 102  CO2 21* 24  BUN 23 16  CREATININE 1.00 0.79  GLUCOSE 186* 122*  CALCIUM 8.2* 8.7*   Exam: General - Patient is Alert and Oriented Extremity - Neurologically intact Neurovascular intact Sensation intact distally Dorsiflexion/Plantar flexion intact Dressing/Incision - clean, dry, no drainage Motor Function - intact, moving foot and toes well on exam.   Past Medical History:  Diagnosis Date  . Allergy history unknown   . Arthritis    oa  . Asthma    takes acolate for  . Breast cancer ([place]) 1999; 2008   hx of bilateral breast cancer  . Breast cancer, left breast ([place]) 10/27/2012   3.2 cm ER positive, Her-2 negative, 1 node positive S/P mastectomy/TTRAM reconstruction 4/08  . Breast cancer, right breast ([place]) 10/27/2012   3.2 cm ER/PR positive, 1 node  positive, Her-2 negative Rx w mastectomy, AC -T chemo, followed by aromatase inhibitor x 5 years dx 4/08  . Cancer [facility])    breast CA  . Dehydration after exertion    after she went berry picking in the summer heat . see ED visit   . Diabetes ([place])   . DM2 (diabetes mellitus, type 2) ([place])    on welchol for  . Dyspnea    with exertion  . Elevated cholesterol   . GERD (gastroesophageal reflux disease)   . Hiatal hernia   . Hypertension   . LBBB (left bundle branch block) 10/27/2012  . Leg cramps   . NICM (nonischemic cardiomyopathy) ([place])    a. Echo:  06/14/12: Poor endocardial definition, possible septal HK, EF 50-55%, normal wall motion, mild LAE ;  b.  [facility] 9/13:  normal cors, EF 35-40%,  c. Echo 7/14: EF 50-55%, mild LAE  . Other fatigue 01/01/2015  . Pulmonary embolus ([place]) 2001   after tram flap surgery    Assessment/Plan: 2 Days Post-Op Procedure(s) (LRB): TOTAL KNEE ARTHROPLASTY (Left) Principal Problem:   OA (osteoarthritis) of knee  Estimated body mass index is 32.44 kg/m as calculated from the following:   Height as of this encounter: _0  (1.575 m).   Weight as of this encounter: 80.4 kg. Up with therapy  DVT Prophylaxis - Eliquis Weight-bearing as  tolerated  Patient has had slower progression with physical therapy. Possible discharge tomorrow.   Theresa Duty, PA-C Orthopedic Surgery 11/02/2018, 7:46 AM

## 2018-11-03 LAB — GLUCOSE, CAPILLARY
Glucose-Capillary: 122 mg/dL — ABNORMAL HIGH (ref 70–99)
Glucose-Capillary: 133 mg/dL — ABNORMAL HIGH (ref 70–99)
Glucose-Capillary: 112 mg/dL — ABNORMAL HIGH (ref 70–99)
Glucose-Capillary: 153 mg/dL — ABNORMAL HIGH (ref 70–99)

## 2018-11-03 LAB — CBC
HCT: 29.5 % — ABNORMAL LOW (ref 36.0–46.0)
HEMOGLOBIN: 9.4 g/dL — AB (ref 12.0–15.0)
MCH: 31.5 pg (ref 26.0–34.0)
MCHC: 31.9 g/dL (ref 30.0–36.0)
MCV: 99 fL (ref 80.0–100.0)
Platelets: 190 10*3/uL (ref 150–400)
RBC: 2.98 MIL/uL — ABNORMAL LOW (ref 3.87–5.11)
RDW: 12.6 % (ref 11.5–15.5)
WBC: 11 10*3/uL — ABNORMAL HIGH (ref 4.0–10.5)
nRBC: 0 % (ref 0.0–0.2)

## 2018-11-03 NOTE — Progress Notes (Signed)
Physical Therapy Treatment Patient Details Name: [name] MRN: [id number] DOB: [birth year] Today's Date: 11/03/2018    History of Present Illness L TKA, PMH:  R TKA June 2019, manipulation R knee August 2019    PT Comments    Continue PT POC; pt is resistant to knee flexion and states "I don't care, I will have a manipulation, it's too soon"  Follow Up Recommendations  Follow surgeon's recommendation for DC plan and follow-up therapies     Equipment Recommendations  None recommended by PT    Recommendations for Other Services       Precautions / Restrictions Precautions Precautions: Knee Precaution Comments: reviewed no pillow under knee Restrictions Weight Bearing Restrictions: No Other Position/Activity Restrictions: WBAT    Mobility  Bed Mobility               General bed mobility comments: (in chair)  Transfers Overall transfer level: Needs assistance Equipment used: Rolling walker (2 wheeled) Transfers: Sit to/from Stand Sit to Stand: Min guard         General transfer comment: VCs hand placement  Ambulation/Gait Ambulation/Gait assistance: Min guard Gait Distance (Feet): 60 Feet Assistive device: Rolling walker (2 wheeled) Gait Pattern/deviations: Step-to pattern;Antalgic;Trunk flexed     General Gait Details: increased time, cues for sequencing   Stairs             Wheelchair Mobility    Modified Rankin (Stroke Patients Only)       Balance                                            Cognition Arousal/Alertness: Awake/alert Behavior During Therapy: WFL for tasks assessed/performed Overall Cognitive Status: Within Functional Limits for tasks assessed                                 General Comments: pt states she is "loopy", sleepy d/t meds      Exercises Total Joint Exercises Ankle Circles/Pumps: AROM;Both;10 reps;Supine Quad Sets: AROM;Left;Supine;10 reps Heel Slides: AAROM;10  reps;Left Straight Leg Raises: AAROM;Left;10 reps Knee Flexion: AAROM;Left;Seated;10 reps Goniometric ROM: grossly -10* to 45* AAROM--incr pain and muscle guarding    General Comments        Pertinent Vitals/Pain Pain Assessment: 0-10 Pain Score: 5  Pain Location: Left knee  Pain Descriptors / Indicators: Sore;Grimacing;Guarding Pain Intervention(s): Limited activity within patient's tolerance;Monitored during session;Premedicated before session;Repositioned;Ice applied    Home Living                      Prior Function            PT Goals (current goals can now be found in the care plan section) Acute Rehab PT Goals Patient Stated Goal: to be able to travel PT Goal Formulation: With patient Time For Goal Achievement: 11/08/18 Potential to Achieve Goals: Fair Progress towards PT goals: Progressing toward goals    Frequency    7X/week      PT Plan Current plan remains appropriate    Co-evaluation              AM-PAC PT "6 Clicks" Mobility   Outcome Measure  Help needed turning from your back to your side while in a flat bed without using bedrails?: A Little Help needed moving from lying  on your back to sitting on the side of a flat bed without using bedrails?: A Little Help needed moving to and from a bed to a chair (including a wheelchair)?: A Little Help needed standing up from a chair using your arms (e.g., wheelchair or bedside chair)?: A Little Help needed to walk in hospital room?: A Little Help needed climbing 3-5 steps with a railing? : A Lot 6 Click Score: 17    End of Session Equipment Utilized During Treatment: Gait belt Activity Tolerance: Patient limited by pain Patient left: in chair;with call bell/phone within reach;with chair alarm set Nurse Communication: Mobility status PT Visit Diagnosis: Difficulty in walking, not elsewhere classified (R26.2);Pain Pain - Right/Left: Left Pain - part of body: Knee     Time: 0957-1020 PT  Time Calculation (min) (ACUTE ONLY): 23 min  Charges:  $Gait Training: 8-22 mins $Therapeutic Exercise: 8-22 mins                     Kenyon Ana, PT  Pager: [phone] Acute Rehab Dept [facility]): [phone]   11/03/2018    [facility] 11/03/2018, 11:13 AM

## 2018-11-03 NOTE — Progress Notes (Signed)
   11/03/18 1500  PT Visit Information  Last PT Received On 11/03/18 pt remains quite sleepy, attempted to amb however pt declined d/t pain level; RN aware; continue PT POC  Assistance Needed +1  History of Present Illness L TKA, PMH:  R TKA June 2019, manipulation R knee August 2019  Subjective Data  Patient Stated Goal to be able to travel  Precautions  Precautions Knee  Precaution Comments reviewed no pillow under knee  Restrictions  Weight Bearing Restrictions No  Other Position/Activity Restrictions WBAT  Pain Assessment  Pain Assessment 0-10  Pain Score 8  Pain Location Left knee   Pain Descriptors / Indicators Sore;Grimacing;Guarding  Pain Intervention(s) Limited activity within patient's tolerance;Monitored during session;Repositioned;Ice applied  Cognition  Arousal/Alertness Awake/alert  Behavior During Therapy WFL for tasks assessed/performed  Overall Cognitive Status Within Functional Limits for tasks assessed  General Comments pt states she is "loopy", sleepy d/t meds  Bed Mobility  Overal bed mobility Needs Assistance  Bed Mobility Sit to Supine  Sit to supine Min assist  General bed mobility comments min A for LLE into bed, pt using gait belt as leg lifter (in chair)  Transfers  Overall transfer level Needs assistance  Equipment used Rolling walker (2 wheeled)  Transfers Sit to/from [employer] Transfers  Sit to Stand Min guard  General transfer comment VCs hand placement  Ambulation/Gait  Ambulation/Gait assistance Min guard  Gait Distance (Feet) 5 Feet  Assistive device Rolling walker (2 wheeled)  Gait Pattern/deviations Step-to pattern;Antalgic;Trunk flexed  General Gait Details increased time, cues for sequencing  Total Joint Exercises  Ankle Circles/Pumps AROM;Both;10 reps;Supine  Quad Sets AROM;Left;Supine;10 reps  PT - End of Session  Equipment Utilized During Treatment Gait belt  Activity Tolerance Patient limited by pain  Patient left in  chair;with call bell/phone within reach;with chair alarm set  Nurse Communication Mobility status   PT - Assessment/Plan  PT Plan Current plan remains appropriate  PT Visit Diagnosis Difficulty in walking, not elsewhere classified (R26.2);Pain  Pain - Right/Left Left  Pain - part of body Knee  PT Frequency (ACUTE ONLY) 7X/week  Follow Up Recommendations Follow surgeon's recommendation for DC plan and follow-up therapies  PT equipment None recommended by PT  AM-PAC PT "6 Clicks" Mobility Outcome Measure (Version 2)  Help needed turning from your back to your side while in a flat bed without using bedrails? 3  Help needed moving from lying on your back to sitting on the side of a flat bed without using bedrails? 3  Help needed moving to and from a bed to a chair (including a wheelchair)? 3  Help needed standing up from a chair using your arms (e.g., wheelchair or bedside chair)? 3  Help needed to walk in hospital room? 3  Help needed climbing 3-5 steps with a railing?  2  6 Click Score 17  Consider Recommendation of Discharge To: Home with [facility]  PT Goal Progression  Progress towards PT goals Progressing toward goals  Acute Rehab PT Goals  PT Goal Formulation With patient  Time For Goal Achievement 11/08/18  Potential to Achieve Goals Fair  PT Time Calculation  PT Start Time (ACUTE ONLY) 1520  PT Stop Time (ACUTE ONLY) 1538  PT Time Calculation (min) (ACUTE ONLY) 18 min  PT General Charges  $$ ACUTE PT VISIT 1 Visit  PT Treatments  $Therapeutic Activity 8-22 mins

## 2018-11-03 NOTE — Care Management Important Message (Signed)
Important Message  Patient Details  Name: [name] MRN: [id number] Date of Birth: 03/28/1946   Medicare Important Message Given:  Yes    Kerin Salen 11/03/2018, 10:46 AMImportant Message  Patient Details  Name: [name] MRN: [id number] Date of Birth: 1946/04/29   Medicare Important Message Given:  Yes    Kerin Salen 11/03/2018, 10:46 AM

## 2018-11-03 NOTE — Progress Notes (Signed)
Subjective: 3 Days Post-Op Procedure(s) (LRB): TOTAL KNEE ARTHROPLASTY (Left) Patient reports pain as moderate.   Patient seen in rounds by Dr. Wynelle Link. Patient is well, and has had no acute complaints or problems other than pain in the left knee. Denies chest pain or SOB. No issues overnight. Voiding without difficulty. Positive flatus.  Plan is to go Home after hospital stay.  Objective: Vital signs in last 24 hours: Temp:  [97.3 F (36.3 C)-98.2 F (36.8 C)] 98.2 F (36.8 C) (01/30 0522) Pulse Rate:  [66-81] 81 (01/30 0522) Resp:  [14-16] 14 (01/29 1811) BP: (106-136)/(53-67) 130/67 (01/30 0522) SpO2:  [95 %-100 %] 98 % (01/30 0522)  Intake/Output from previous day:  Intake/Output Summary (Last 24 hours) at 11/03/2018 0752 Last data filed at 11/03/2018 0600 Gross per 24 hour  Intake 420 ml  Output 1100 ml  Net -680 ml    Labs: Recent Labs    11/01/18 0345 11/02/18 0558 11/03/18 0538  HGB 9.3* 9.4* 9.4*   Recent Labs    11/02/18 0558 11/03/18 0538  WBC 13.1* 11.0*  RBC 2.97* 2.98*  HCT 29.5* 29.5*  PLT 187 190   Recent Labs    11/01/18 0345 11/02/18 0558  NA 133* 134*  K 3.9 3.7  CL 105 102  CO2 21* 24  BUN 23 16  CREATININE 1.00 0.79  GLUCOSE 186* 122*  CALCIUM 8.2* 8.7*   Exam: General - Patient is Alert and Oriented Extremity - Neurologically intact Neurovascular intact Sensation intact distally Dorsiflexion/Plantar flexion intact Dressing/Incision - clean, dry, no drainage Motor Function - intact, moving foot and toes well on exam.   Past Medical History:  Diagnosis Date  . Allergy history unknown   . Arthritis    oa  . Asthma    takes acolate for  . Breast cancer ([place]) 1999; 2008   hx of bilateral breast cancer  . Breast cancer, left breast ([place]) 10/27/2012   3.2 cm ER positive, Her-2 negative, 1 node positive S/P mastectomy/TTRAM reconstruction 4/08  . Breast cancer, right breast ([place]) 10/27/2012   3.2 cm ER/PR positive, 1 node  positive, Her-2 negative Rx w mastectomy, AC -T chemo, followed by aromatase inhibitor x 5 years dx 4/08  . Cancer [facility])    breast CA  . Dehydration after exertion    after she went berry picking in the summer heat . see ED visit   . Diabetes ([place])   . DM2 (diabetes mellitus, type 2) ([place])    on welchol for  . Dyspnea    with exertion  . Elevated cholesterol   . GERD (gastroesophageal reflux disease)   . Hiatal hernia   . Hypertension   . LBBB (left bundle branch block) 10/27/2012  . Leg cramps   . NICM (nonischemic cardiomyopathy) ([place])    a. Echo:  06/14/12: Poor endocardial definition, possible septal HK, EF 50-55%, normal wall motion, mild LAE ;  b.  [facility] 9/13:  normal cors, EF 35-40%,  c. Echo 7/14: EF 50-55%, mild LAE  . Other fatigue 01/01/2015  . Pulmonary embolus ([place]) 2001   after tram flap surgery    Assessment/Plan: 3 Days Post-Op Procedure(s) (LRB): TOTAL KNEE ARTHROPLASTY (Left) Principal Problem:   OA (osteoarthritis) of knee  Estimated body mass index is 32.44 kg/m as calculated from the following:   Height as of this encounter: _0  (1.575 m).   Weight as of this encounter: 80.4 kg. Up with therapy  DVT Prophylaxis - Eliquis Weight-bearing as  tolerated  Possible discharge this afternoon if progresses with therapy and meeting her goals. Ordered HHPT for first two weeks at home. Follow-up in the office in 2 weeks.   Theresa Duty, PA-C Orthopedic Surgery 11/03/2018, 7:52 AM

## 2018-11-04 LAB — GLUCOSE, CAPILLARY: Glucose-Capillary: 122 mg/dL — ABNORMAL HIGH (ref 70–99)

## 2018-11-04 NOTE — Progress Notes (Signed)
   11/04/18 1300  PT Visit Information  Last PT Received On 11/04/18  Assistance Needed +1  History of Present Illness L TKA, PMH:  R TKA June 2019, manipulation R knee August 2019  Subjective Data  Patient Stated Goal to be able to travel  Precautions  Precautions Knee  Restrictions  Other Position/Activity Restrictions WBAT  Pain Assessment  Pain Assessment 0-10  Pain Score 5  Pain Location Left knee   Pain Descriptors / Indicators Sore;Grimacing;Guarding  Pain Intervention(s) Limited activity within patient's tolerance;Monitored during session  Cognition  Arousal/Alertness Awake/alert  Behavior During Therapy [facility] for tasks assessed/performed  Overall Cognitive Status Within Functional Limits for tasks assessed  Total Joint Exercises  Ankle Circles/Pumps AROM;Both;20 reps  Quad Sets AROM;Left;Supine;10 reps  Short Arc [employer];Left;10 reps;Supine  Heel Slides AAROM;10 reps;Left  Hip ABduction/ADduction AAROM;Left;10 reps;Supine  Straight Leg Raises AAROM;Left;10 reps  PT - End of Session  Equipment Utilized During Treatment Gait belt  Activity Tolerance Patient tolerated treatment well  Patient left with call bell/phone within reach;in chair;with family/visitor present  Nurse Communication Mobility status   PT - Assessment/Plan  PT Plan Current plan remains appropriate  PT Visit Diagnosis Difficulty in walking, not elsewhere classified (R26.2);Pain  Pain - Right/Left Left  Pain - part of body Knee  PT Frequency (ACUTE ONLY) 7X/week  Follow Up Recommendations Follow surgeon's recommendation for DC plan and follow-up therapies  PT equipment None recommended by PT  AM-PAC PT "6 Clicks" Mobility Outcome Measure (Version 2)  Help needed turning from your back to your side while in a flat bed without using bedrails? 3  Help needed moving from lying on your back to sitting on the side of a flat bed without using bedrails? 3  Help needed moving to and from a bed to a chair  (including a wheelchair)? 3  Help needed standing up from a chair using your arms (e.g., wheelchair or bedside chair)? 3  Help needed to walk in hospital room? 3  Help needed climbing 3-5 steps with a railing?  2  6 Click Score 17  Consider Recommendation of Discharge To: Home with [facility]  PT Goal Progression  Progress towards PT goals Progressing toward goals  Acute Rehab PT Goals  PT Goal Formulation With patient  Time For Goal Achievement 11/08/18  Potential to Achieve Goals Fair  PT Time Calculation  PT Start Time (ACUTE ONLY) 1201  PT Stop Time (ACUTE ONLY) 1222  PT Time Calculation (min) (ACUTE ONLY) 21 min  PT General Charges  $$ ACUTE PT VISIT 1 Visit  PT Treatments  $Therapeutic Exercise 8-22 mins

## 2018-11-04 NOTE — Progress Notes (Signed)
 Subjective: 4 Days Post-Op Procedure(s) (LRB): TOTAL KNEE ARTHROPLASTY (Left) Patient reports pain as mild.   Patient seen in rounds by Dr. Aluisio. Patient is well, and has had no acute complaints or problems. States she is ready to go home. Denies chest pain or SOB. Voiding without difficulty. Plan is to go Home after hospital stay.  Objective: Vital signs in last 24 hours: Temp:  [97.8 F (36.6 C)-98.4 F (36.9 C)] 98.4 F (36.9 C) (01/31 0421) Pulse Rate:  [73-96] 96 (01/31 0421) Resp:  [16] 16 (01/31 0421) BP: (101-130)/(51-74) 130/74 (01/31 0421) SpO2:  [95 %-99 %] 95 % (01/31 0421)  Intake/Output from previous day:  Intake/Output Summary (Last 24 hours) at 11/04/2018 0717 Last data filed at 11/03/2018 2200 Gross per 24 hour  Intake 480 ml  Output 550 ml  Net -70 ml    Intake/Output this shift: No intake/output data recorded.  Labs: Recent Labs    11/02/18 0558 11/03/18 0538  HGB 9.4* 9.4*   Recent Labs    11/02/18 0558 11/03/18 0538  WBC 13.1* 11.0*  RBC 2.97* 2.98*  HCT 29.5* 29.5*  PLT 187 190   Recent Labs    11/02/18 0558  NA 134*  K 3.7  CL 102  CO2 24  BUN 16  CREATININE 0.79  GLUCOSE 122*  CALCIUM 8.7*   No results for input(s): LABPT, INR in the last 72 hours.  Exam: General - Patient is Alert and Oriented Extremity - Neurologically intact Neurovascular intact Sensation intact distally Dorsiflexion/Plantar flexion intact Dressing/Incision - clean, dry, no drainage Motor Function - intact, moving foot and toes well on exam.   Past Medical History:  Diagnosis Date  . Allergy history unknown   . Arthritis    oa  . Asthma    takes acolate for  . Breast cancer ([facility]) 1999; 2008   hx of bilateral breast cancer  . Breast cancer, left breast ([facility]) 10/27/2012   3.2 cm ER positive, Her-2 negative, 1 node positive S/P mastectomy/TTRAM reconstruction 4/08  . Breast cancer, right breast ([facility]) 10/27/2012   3.2 cm ER/PR positive, 1 node  positive, Her-2 negative Rx w mastectomy, AC -T chemo, followed by aromatase inhibitor x 5 years dx 4/08  . Cancer ([facility])    breast CA  . Dehydration after exertion    after she went berry picking in the summer heat . see ED visit   . Diabetes ([facility])   . DM2 (diabetes mellitus, type 2) ([facility])    on welchol for  . Dyspnea    with exertion  . Elevated cholesterol   . GERD (gastroesophageal reflux disease)   . Hiatal hernia   . Hypertension   . LBBB (left bundle branch block) 10/27/2012  . Leg cramps   . NICM (nonischemic cardiomyopathy) ([facility])    a. Echo:  06/14/12: Poor endocardial definition, possible septal HK, EF 50-55%, normal wall motion, mild LAE ;  b.  [facility] 9/13:  normal cors, EF 35-40%,  c. Echo 7/14: EF 50-55%, mild LAE  . Other fatigue 01/01/2015  . Pulmonary embolus ([facility]) 2001   after tram flap surgery    Assessment/Plan: 4 Days Post-Op Procedure(s) (LRB): TOTAL KNEE ARTHROPLASTY (Left) Principal Problem:   OA (osteoarthritis) of knee  Estimated body mass index is 32.44 kg/m as calculated from the following:   Height as of this encounter: 5' 2" (1.575 m).   Weight as of this encounter: 80.4 kg. Up with therapy  DVT Prophylaxis - Eliquis Weight-bearing   as tolerated  Plan for discharge to home today with HHPT. Follow-up in the office in 2 weeks.   Theresa Duty, PA-C Orthopedic Surgery 11/04/2018, 7:17 AM

## 2018-11-04 NOTE — Progress Notes (Signed)
Physical Therapy Treatment Patient Details Name: [name] MRN: [id number] DOB: [birth year] Today's Date: 11/04/2018    History of Present Illness L TKA, PMH:  R TKA June 2019, manipulation R knee August 2019    PT Comments    Pt progressing well with gait, reluctant to work on HEP, knee flexion, continues to report she is planning on having a knee manipulation to get her knee flexion.   Follow Up Recommendations  Follow surgeon's recommendation for DC plan and follow-up therapies     Equipment Recommendations  None recommended by PT    Recommendations for Other Services       Precautions / Restrictions Precautions Precautions: Knee Restrictions Weight Bearing Restrictions: No Other Position/Activity Restrictions: WBAT    Mobility  Bed Mobility               General bed mobility comments: pt received in chair  Transfers Overall transfer level: Needs assistance Equipment used: Rolling walker (2 wheeled) Transfers: Sit to/from Stand Sit to Stand: Min guard         General transfer comment: VCs hand placement  Ambulation/Gait Ambulation/Gait assistance: Supervision Gait Distance (Feet): 70 Feet Assistive device: Rolling walker (2 wheeled) Gait Pattern/deviations: Step-to pattern Gait velocity: decr   General Gait Details: increased time, cues for sequencing   Stairs             Wheelchair Mobility    Modified Rankin (Stroke Patients Only)       Balance                                            Cognition Arousal/Alertness: Awake/alert Behavior During Therapy: WFL for tasks assessed/performed Overall Cognitive Status: Within Functional Limits for tasks assessed                                 General Comments: does not recall having 2 sessions of PT yesterday      Exercises      General Comments        Pertinent Vitals/Pain Pain Assessment: 0-10 Pain Score: 4  Pain Location: Left knee   Pain Descriptors / Indicators: Sore;Grimacing;Guarding Pain Intervention(s): Limited activity within patient's tolerance;Monitored during session    Home Living                      Prior Function            PT Goals (current goals can now be found in the care plan section) Acute Rehab PT Goals Patient Stated Goal: to be able to travel PT Goal Formulation: With patient Time For Goal Achievement: 11/08/18 Potential to Achieve Goals: Fair Progress towards PT goals: Progressing toward goals    Frequency    7X/week      PT Plan Current plan remains appropriate    Co-evaluation              AM-PAC PT "6 Clicks" Mobility   Outcome Measure  Help needed turning from your back to your side while in a flat bed without using bedrails?: A Little Help needed moving from lying on your back to sitting on the side of a flat bed without using bedrails?: A Little Help needed moving to and from a bed to a chair (including a wheelchair)?: A Little Help  needed standing up from a chair using your arms (e.g., wheelchair or bedside chair)?: A Little Help needed to walk in hospital room?: A Little Help needed climbing 3-5 steps with a railing? : A Lot 6 Click Score: 17    End of Session Equipment Utilized During Treatment: Gait belt Activity Tolerance: Patient tolerated treatment well Patient left: in chair;with call bell/phone within reach;with family/visitor present Nurse Communication: Mobility status PT Visit Diagnosis: Difficulty in walking, not elsewhere classified (R26.2);Pain Pain - part of body: Knee     Time: 1000-1024 PT Time Calculation (min) (ACUTE ONLY): 24 min  Charges:  $Gait Training: 23-37 mins                     Kenyon Ana, PT  Pager: [phone] Acute Rehab Dept [facility]): [phone]   11/04/2018    [facility] 11/04/2018, 11:57 AM

## 2018-11-04 NOTE — Care Management Note (Signed)
Case Management Note  Patient Details  Name: [name] MRN: [id number] Date of Birth: 27-Dec-1945  Subjective/Objective:    Plan now for home with HHPT, spoke with patient and spouse at bedside. They are agreeable and have use [facility] in the past.                 Action/Plan: Contacted [facility] for referral and they have accepted. No DME needs. [phone]  Expected Discharge Date:  11/03/18               Expected Discharge Plan:  [place]  In-House Referral:  NA  Discharge planning Services  CM Consult  Post Acute Care Choice:  NA Choice offered to:  Patient, Spouse  DME Arranged:  N/A DME Agency:  NA  HH Arranged:  PT [place] Agency:  [facility] (formerly [employer])  Status of Service:  Completed, signed off  If discussed at [employer], dates discussed:    Additional Comments:  Guadalupe Maple, RN 11/04/2018, 9:45 AM

## 2018-11-06 DIAGNOSIS — I429 Cardiomyopathy, unspecified: Secondary | ICD-10-CM | POA: Diagnosis not present

## 2018-11-06 DIAGNOSIS — Z9013 Acquired absence of bilateral breasts and nipples: Secondary | ICD-10-CM | POA: Diagnosis not present

## 2018-11-06 DIAGNOSIS — I480 Paroxysmal atrial fibrillation: Secondary | ICD-10-CM | POA: Diagnosis not present

## 2018-11-06 DIAGNOSIS — Z86711 Personal history of pulmonary embolism: Secondary | ICD-10-CM | POA: Diagnosis not present

## 2018-11-06 DIAGNOSIS — Z7901 Long term (current) use of anticoagulants: Secondary | ICD-10-CM | POA: Diagnosis not present

## 2018-11-06 DIAGNOSIS — I10 Essential (primary) hypertension: Secondary | ICD-10-CM | POA: Diagnosis not present

## 2018-11-06 DIAGNOSIS — E119 Type 2 diabetes mellitus without complications: Secondary | ICD-10-CM | POA: Diagnosis not present

## 2018-11-06 DIAGNOSIS — Z96653 Presence of artificial knee joint, bilateral: Secondary | ICD-10-CM | POA: Diagnosis not present

## 2018-11-06 DIAGNOSIS — Z853 Personal history of malignant neoplasm of breast: Secondary | ICD-10-CM | POA: Diagnosis not present

## 2018-11-06 DIAGNOSIS — K219 Gastro-esophageal reflux disease without esophagitis: Secondary | ICD-10-CM | POA: Diagnosis not present

## 2018-11-06 DIAGNOSIS — Z471 Aftercare following joint replacement surgery: Secondary | ICD-10-CM | POA: Diagnosis not present

## 2018-11-06 DIAGNOSIS — I447 Left bundle-branch block, unspecified: Secondary | ICD-10-CM | POA: Diagnosis not present

## 2018-11-06 DIAGNOSIS — J45909 Unspecified asthma, uncomplicated: Secondary | ICD-10-CM | POA: Diagnosis not present

## 2018-11-06 DIAGNOSIS — E785 Hyperlipidemia, unspecified: Secondary | ICD-10-CM | POA: Diagnosis not present

## 2018-11-07 NOTE — Discharge Summary (Signed)
Physician Discharge Summary   Patient ID: [name] MRN: [id number] DOB/AGE: 1945/10/22 73 y.o.  Admit date: 10/31/2018 Discharge date: 11/04/2018  Primary Diagnosis: Osteoarthritis, left knee   Admission Diagnoses:  Past Medical History:  Diagnosis Date  . Allergy history unknown   . Arthritis    oa  . Asthma    takes acolate for  . Breast cancer ([place]) 1999; 2008   hx of bilateral breast cancer  . Breast cancer, left breast ([place]) 10/27/2012   3.2 cm ER positive, Her-2 negative, 1 node positive S/P mastectomy/TTRAM reconstruction 4/08  . Breast cancer, right breast ([place]) 10/27/2012   3.2 cm ER/PR positive, 1 node positive, Her-2 negative Rx w mastectomy, AC -T chemo, followed by aromatase inhibitor x 5 years dx 4/08  . Cancer [facility])    breast CA  . Dehydration after exertion    after she went berry picking in the summer heat . see ED visit   . Diabetes ([place])   . DM2 (diabetes mellitus, type 2) ([place])    on welchol for  . Dyspnea    with exertion  . Elevated cholesterol   . GERD (gastroesophageal reflux disease)   . Hiatal hernia   . Hypertension   . LBBB (left bundle branch block) 10/27/2012  . Leg cramps   . NICM (nonischemic cardiomyopathy) ([place])    a. Echo:  06/14/12: Poor endocardial definition, possible septal HK, EF 50-55%, normal wall motion, mild LAE ;  b.  [facility] 9/13:  normal cors, EF 35-40%,  c. Echo 7/14: EF 50-55%, mild LAE  . Other fatigue 01/01/2015  . Pulmonary embolus ([place]) 2001   after tram flap surgery   Discharge Diagnoses:   Principal Problem:   OA (osteoarthritis) of knee  Estimated body mass index is 32.44 kg/m as calculated from the following:   Height as of this encounter: 5' 2"  (1.575 m).   Weight as of this encounter: 80.4 kg.  Procedure:  Procedure(s) (LRB): TOTAL KNEE ARTHROPLASTY (Left)   Consults: None  HPI: [name] is a 73 y.o. year old female with end stage OA of her left knee with progressively worsening pain and  dysfunction. She has constant pain, with activity and at rest and significant functional deficits with difficulties even with ADLs. She has had extensive non-op management including analgesics, injections of cortisone and viscosupplements, and home exercise program, but remains in significant pain with significant dysfunction. Radiographs show bone on bone arthritis medial and patellofemoral. She presents now for left Total Knee Arthroplasty.    Laboratory Data: Admission on 10/31/2018, Discharged on 11/04/2018  Component Date Value Ref Range Status  . Glucose-Capillary 10/31/2018 97  70 - 99 mg/dL Final  . Comment 1 10/31/2018 Notify RN   Final  . Comment 2 10/31/2018 Document in Chart   Final  . Glucose-Capillary 10/31/2018 106* 70 - 99 mg/dL Final  . WBC 11/01/2018 9.9  4.0 - 10.5 K/uL Final  . RBC 11/01/2018 2.94* 3.87 - 5.11 MIL/uL Final  . Hemoglobin 11/01/2018 9.3* 12.0 - 15.0 g/dL Final  . HCT 11/01/2018 28.9* 36.0 - 46.0 % Final  . MCV 11/01/2018 98.3  80.0 - 100.0 fL Final  . MCH 11/01/2018 31.6  26.0 - 34.0 pg Final  . MCHC 11/01/2018 32.2  30.0 - 36.0 g/dL Final  . RDW 11/01/2018 12.2  11.5 - 15.5 % Final  . Platelets 11/01/2018 203  150 - 400 K/uL Final  . nRBC 11/01/2018 0.0  0.0 - 0.2 %  Final   Performed at [facility], [place] [address] [place], [place] [address]  . Sodium 11/01/2018 133* 135 - 145 mmol/L Final  . Potassium 11/01/2018 3.9  3.5 - 5.1 mmol/L Final  . Chloride 11/01/2018 105  98 - 111 mmol/L Final  . CO2 11/01/2018 21* 22 - 32 mmol/L Final  . Glucose, Bld 11/01/2018 186* 70 - 99 mg/dL Final  . BUN 11/01/2018 23  8 - 23 mg/dL Final  . Creatinine, Ser 11/01/2018 1.00  0.44 - 1.00 mg/dL Final  . Calcium 11/01/2018 8.2* 8.9 - 10.3 mg/dL Final  . GFR calc non Af Amer 11/01/2018 55* >60 mL/min Final  . GFR calc Af Amer 11/01/2018 >60  >60 mL/min Final  . Anion gap 11/01/2018 7  5 - 15 Final   Performed at [facility], [place]  [address] [place], [place] [address]  . Glucose-Capillary 10/31/2018 151* 70 - 99 mg/dL Final  . Glucose-Capillary 10/31/2018 178* 70 - 99 mg/dL Final  . Glucose-Capillary 11/01/2018 119* 70 - 99 mg/dL Final  . Glucose-Capillary 11/01/2018 142* 70 - 99 mg/dL Final  . Glucose-Capillary 11/01/2018 181* 70 - 99 mg/dL Final  . WBC 11/02/2018 13.1* 4.0 - 10.5 K/uL Final  . RBC 11/02/2018 2.97* 3.87 - 5.11 MIL/uL Final  . Hemoglobin 11/02/2018 9.4* 12.0 - 15.0 g/dL Final  . HCT 11/02/2018 29.5* 36.0 - 46.0 % Final  . MCV 11/02/2018 99.3  80.0 - 100.0 fL Final  . MCH 11/02/2018 31.6  26.0 - 34.0 pg Final  . MCHC 11/02/2018 31.9  30.0 - 36.0 g/dL Final  . RDW 11/02/2018 12.7  11.5 - 15.5 % Final  . Platelets 11/02/2018 187  150 - 400 K/uL Final  . nRBC 11/02/2018 0.0  0.0 - 0.2 % Final   Performed at [facility], [place] [address] [place], [place] [address]  . Sodium 11/02/2018 134* 135 - 145 mmol/L Final  . Potassium 11/02/2018 3.7  3.5 - 5.1 mmol/L Final  . Chloride 11/02/2018 102  98 - 111 mmol/L Final  . CO2 11/02/2018 24  22 - 32 mmol/L Final  . Glucose, Bld 11/02/2018 122* 70 - 99 mg/dL Final  . BUN 11/02/2018 16  8 - 23 mg/dL Final  . Creatinine, Ser 11/02/2018 0.79  0.44 - 1.00 mg/dL Final  . Calcium 11/02/2018 8.7* 8.9 - 10.3 mg/dL Final  . GFR calc non Af Amer 11/02/2018 >60  >60 mL/min Final  . GFR calc Af Amer 11/02/2018 >60  >60 mL/min Final  . Anion gap 11/02/2018 8  5 - 15 Final   Performed at [facility], [place] [address], [place], [place] [address]  . Glucose-Capillary 11/01/2018 225* 70 - 99 mg/dL Final  . Glucose-Capillary 11/02/2018 103* 70 - 99 mg/dL Final  . Glucose-Capillary 11/02/2018 116* 70 - 99 mg/dL Final  . Glucose-Capillary 11/02/2018 135* 70 - 99 mg/dL Final  . WBC 11/03/2018 11.0* 4.0 - 10.5 K/uL Final  . RBC 11/03/2018 2.98* 3.87 - 5.11 MIL/uL Final  . Hemoglobin 11/03/2018 9.4* 12.0 - 15.0 g/dL Final  . HCT 11/03/2018  29.5* 36.0 - 46.0 % Final  . MCV 11/03/2018 99.0  80.0 - 100.0 fL Final  . MCH 11/03/2018 31.5  26.0 - 34.0 pg Final  . MCHC 11/03/2018 31.9  30.0 - 36.0 g/dL Final  . RDW 11/03/2018 12.6  11.5 - 15.5 % Final  . Platelets 11/03/2018 190  150 - 400 K/uL Final  .  nRBC 11/03/2018 0.0  0.0 - 0.2 % Final   Performed at [place] [address] [place], [place] [address]  . Glucose-Capillary 11/02/2018 157* 70 - 99 mg/dL Final  . Glucose-Capillary 11/03/2018 122* 70 - 99 mg/dL Final  . Glucose-Capillary 11/03/2018 133* 70 - 99 mg/dL Final  . Glucose-Capillary 11/03/2018 112* 70 - 99 mg/dL Final  . Glucose-Capillary 11/03/2018 153* 70 - 99 mg/dL Final  . Glucose-Capillary 11/04/2018 122* 70 - 99 mg/dL Final  Hospital Outpatient Visit on 10/25/2018  Component Date Value Ref Range Status  . aPTT 10/25/2018 31  24 - 36 seconds Final   Performed at [facility], [place] [address] [place], [place] [address]  . WBC 10/25/2018 6.5  4.0 - 10.5 K/uL Final  . RBC 10/25/2018 3.47* 3.87 - 5.11 MIL/uL Final  . Hemoglobin 10/25/2018 10.8* 12.0 - 15.0 g/dL Final  . HCT 10/25/2018 34.6* 36.0 - 46.0 % Final  . MCV 10/25/2018 99.7  80.0 - 100.0 fL Final  . MCH 10/25/2018 31.1  26.0 - 34.0 pg Final  . MCHC 10/25/2018 31.2  30.0 - 36.0 g/dL Final  . RDW 10/25/2018 12.6  11.5 - 15.5 % Final  . Platelets 10/25/2018 250  150 - 400 K/uL Final  . nRBC 10/25/2018 0.0  0.0 - 0.2 % Final   Performed at [facility], [place] [address] [place], [place] [address]  . Sodium 10/25/2018 139  135 - 145 mmol/L Final  . Potassium 10/25/2018 3.9  3.5 - 5.1 mmol/L Final  . Chloride 10/25/2018 110  98 - 111 mmol/L Final  . CO2 10/25/2018 22  22 - 32 mmol/L Final  . Glucose, Bld 10/25/2018 104* 70 - 99 mg/dL Final  . BUN 10/25/2018 25* 8 - 23 mg/dL Final  . Creatinine, Ser 10/25/2018 0.96  0.44 - 1.00 mg/dL Final  . Calcium 10/25/2018 8.9  8.9 - 10.3 mg/dL Final  .  Total Protein 10/25/2018 6.9  6.5 - 8.1 g/dL Final  . Albumin 10/25/2018 4.1  3.5 - 5.0 g/dL Final  . AST 10/25/2018 15  15 - 41 U/L Final  . ALT 10/25/2018 17  0 - 44 U/L Final  . Alkaline Phosphatase 10/25/2018 71  38 - 126 U/L Final  . Total Bilirubin 10/25/2018 0.7  0.3 - 1.2 mg/dL Final  . GFR calc non Af Amer 10/25/2018 58* >60 mL/min Final  . GFR calc Af Amer 10/25/2018 >60  >60 mL/min Final  . Anion gap 10/25/2018 7  5 - 15 Final   Performed at [facility], [place] [address], [place], [place] [address]  . Prothrombin Time 10/25/2018 16.0* 11.4 - 15.2 seconds Final  . INR 10/25/2018 1.30   Final   Performed at [facility], [place] [address] [place], [place] [address]  . ABO/RH(D) 10/25/2018 A POS   Final  . Antibody Screen 10/25/2018 NEG   Final  . Sample Expiration 10/25/2018 11/03/2018   Final  . Extend sample reason 10/25/2018    Final                   Value:NO TRANSFUSIONS OR PREGNANCY IN THE PAST 3 MONTHS Performed at [facility], [place] [address] [place],  [address]   . Hgb A1c MFr Bld 10/25/2018 6.6* 4.8 - 5.6 % Final   Comment: (NOTE) Pre diabetes:          5.7%-6.4% Diabetes:              >  6.4% Glycemic control for   <7.0% adults with diabetes   . Mean Plasma Glucose 10/25/2018 142.72  mg/dL Final   Performed at [place] [address] [place], [place] [address]  . MRSA, PCR 10/25/2018 NEGATIVE  NEGATIVE Final  . Staphylococcus aureus 10/25/2018 NEGATIVE  NEGATIVE Final   Comment: (NOTE) The Xpert SA Assay (FDA approved for NASAL specimens in patients 50 years of age and older), is one component of a comprehensive surveillance program. It is not intended to diagnose infection nor to guide or monitor treatment. Performed at [facility], [place] [address] [place], [place] [address]   . Glucose-Capillary 10/25/2018 151* 70 - 99 mg/dL Final  Abstract on 10/06/2018    Component Date Value Ref Range Status  . HM Diabetic Eye Exam 09/12/2018 No Retinopathy  No Retinopathy Final     X-Rays:No results found.  EKG: Orders placed or performed during the hospital encounter of 04/21/18  . ED EKG  . ED EKG  . EKG     Hospital Course: [name] is a 73 y.o. who was admitted to [facility]. They were brought to the operating room on 10/31/2018 and underwent Procedure(s): TOTAL KNEE ARTHROPLASTY.  Patient tolerated the procedure well and was later transferred to the recovery room and then to the orthopaedic floor for postoperative care. They were given PO and IV analgesics for pain control following their surgery. They were given 24 hours of postoperative antibiotics of  Anti-infectives (From admission, onward)   Start     Dose/Rate Route Frequency Ordered Stop   10/31/18 1900  ceFAZolin (ANCEF) IVPB 2g/100 mL premix     2 g 200 mL/hr over 30 Minutes Intravenous Every 6 hours 10/31/18 1540 11/01/18 0106   10/31/18 1030  ceFAZolin (ANCEF) IVPB 2g/100 mL premix     2 g 200 mL/hr over 30 Minutes Intravenous On call to O.R. 10/31/18 1018 10/31/18 1323     and started on DVT prophylaxis in the form of Eliquis.   PT and OT were ordered for total joint protocol. Discharge planning consulted to help with postop disposition and equipment needs. Patient had a decent night on the evening of surgery. They started to get up OOB with therapy on POD #1. Hemovac drain was pulled without difficulty on day one. Continued to work with therapy into POD #2. Dressing was changed and the incision was clean, dry, and intact with no drainage. Patient had slower progression with physical therapy, requiring longer stay in hospital.  Incision was healing well on POD #3. Patient was seen on day four during rounds and was ready to go home. Pt worked with therapy for two additional sessions and was meeting their goals. She was discharged to home later that day in stable  condition.  Diet: Diabetic diet Activity: WBAT Follow-up: in 2 weeks Disposition: Home with HHPT Discharged Condition: stable   Discharge Instructions    Call MD / Call 911   Complete by:  As directed    If you experience chest pain or shortness of breath, CALL 911 and be transported to the hospital emergency room.  If you develope a fever above 101 F, pus (white drainage) or increased drainage or redness at the wound, or calf pain, call your surgeon's office.   Change dressing   Complete by:  As directed    Change the dressing daily with sterile 4 x 4 inch gauze dressing and apply TED hose.   Constipation Prevention  Complete by:  As directed    Drink plenty of fluids.  Prune juice may be helpful.  You may use a stool softener, such as Colace (over the counter) 100 mg twice a day.  Use MiraLax (over the counter) for constipation as needed.   Diet - low sodium heart healthy   Complete by:  As directed    Discharge instructions   Complete by:  As directed    Dr. Gaynelle Arabian Total Joint Specialist [facility] [address] [place], [place] [address] [phone]  TOTAL KNEE REPLACEMENT POSTOPERATIVE DIRECTIONS  Knee Rehabilitation, Guidelines Following Surgery  Results after knee surgery are often greatly improved when you follow the exercise, range of motion and muscle strengthening exercises prescribed by your doctor. Safety measures are also important to protect the knee from further injury. Any time any of these exercises cause you to have increased pain or swelling in your knee joint, decrease the amount until you are comfortable again and slowly increase them. If you have problems or questions, call your caregiver or physical therapist for advice.   HOME CARE INSTRUCTIONS  Remove items at home which could result in a fall. This includes throw rugs or furniture in walking pathways.  ICE to the affected knee every three hours for 30 minutes at a time and then  as needed for pain and swelling.  Continue to use ice on the knee for pain and swelling from surgery. You may notice swelling that will progress down to the foot and ankle.  This is normal after surgery.  Elevate the leg when you are not up walking on it.   Continue to use the breathing machine which will help keep your temperature down.  It is common for your temperature to cycle up and down following surgery, especially at night when you are not up moving around and exerting yourself.  The breathing machine keeps your lungs expanded and your temperature down. Do not place pillow under knee, focus on keeping the knee straight while resting   DIET You may resume your previous home diet once your are discharged from the hospital.  DRESSING / WOUND CARE / SHOWERING You may shower 3 days after surgery, but keep the wounds dry during showering.  You may use an occlusive plastic wrap (Press'n Seal for example), NO SOAKING/SUBMERGING IN THE BATHTUB.  If the bandage gets wet, change with a clean dry gauze.  If the incision gets wet, pat the wound dry with a clean towel. You may start showering once you are discharged home but do not submerge the incision under water. Just pat the incision dry and apply a dry gauze dressing on daily. Change the surgical dressing daily and reapply a dry dressing each time.  ACTIVITY Walk with your walker as instructed. Use walker as long as suggested by your caregivers. Avoid periods of inactivity such as sitting longer than an hour when not asleep. This helps prevent blood clots.  You may resume a sexual relationship in one month or when given the OK by your doctor.  You may return to work once you are cleared by your doctor.  Do not drive a car for 6 weeks or until released by you surgeon.  Do not drive while taking narcotics.  WEIGHT BEARING Weight bearing as tolerated with assist device (walker, cane, etc) as directed, use it as long as suggested by your surgeon or  therapist, typically at least 4-6 weeks.  POSTOPERATIVE CONSTIPATION PROTOCOL Constipation - defined  medically as fewer than three stools per week and severe constipation as less than one stool per week.  One of the most common issues patients have following surgery is constipation.  Even if you have a regular bowel pattern at home, your normal regimen is likely to be disrupted due to multiple reasons following surgery.  Combination of anesthesia, postoperative narcotics, change in appetite and fluid intake all can affect your bowels.  In order to avoid complications following surgery, here are some recommendations in order to help you during your recovery period.  Colace (docusate) - Pick up an over-the-counter form of Colace or another stool softener and take twice a day as long as you are requiring postoperative pain medications.  Take with a full glass of water daily.  If you experience loose stools or diarrhea, hold the colace until you stool forms back up.  If your symptoms do not get better within 1 week or if they get worse, check with your doctor.  Dulcolax (bisacodyl) - Pick up over-the-counter and take as directed by the product packaging as needed to assist with the movement of your bowels.  Take with a full glass of water.  Use this product as needed if not relieved by Colace only.   MiraLax (polyethylene glycol) - Pick up over-the-counter to have on hand.  MiraLax is a solution that will increase the amount of water in your bowels to assist with bowel movements.  Take as directed and can mix with a glass of water, juice, soda, coffee, or tea.  Take if you go more than two days without a movement. Do not use MiraLax more than once per day. Call your doctor if you are still constipated or irregular after using this medication for 7 days in a row.  If you continue to have problems with postoperative constipation, please contact the office for further assistance and recommendations.  If you  experience "the worst abdominal pain ever" or develop nausea or vomiting, please contact the office immediatly for further recommendations for treatment.  ITCHING  If you experience itching with your medications, try taking only a single pain pill, or even half a pain pill at a time.  You can also use Benadryl over the counter for itching or also to help with sleep.   TED HOSE STOCKINGS Wear the elastic stockings on both legs for three weeks following surgery during the day but you may remove then at night for sleeping.  MEDICATIONS See your medication summary on the "After Visit Summary" that the nursing staff will review with you prior to discharge.  You may have some home medications which will be placed on hold until you complete the course of blood thinner medication.  It is important for you to complete the blood thinner medication as prescribed by your surgeon.  Continue your approved medications as instructed at time of discharge.  PRECAUTIONS If you experience chest pain or shortness of breath - call 911 immediately for transfer to the hospital emergency department.  If you develop a fever greater that 101 F, purulent drainage from wound, increased redness or drainage from wound, foul odor from the wound/dressing, or calf pain - CONTACT YOUR SURGEON.                                                   FOLLOW-UP APPOINTMENTS Make  sure you keep all of your appointments after your operation with your surgeon and caregivers. You should call the office at the above phone number and make an appointment for approximately two weeks after the date of your surgery or on the date instructed by your surgeon outlined in the "After Visit Summary".   RANGE OF MOTION AND STRENGTHENING EXERCISES  Rehabilitation of the knee is important following a knee injury or an operation. After just a few days of immobilization, the muscles of the thigh which control the knee become weakened and shrink (atrophy). Knee  exercises are designed to build up the tone and strength of the thigh muscles and to improve knee motion. Often times heat used for twenty to thirty minutes before working out will loosen up your tissues and help with improving the range of motion but do not use heat for the first two weeks following surgery. These exercises can be done on a training (exercise) mat, on the floor, on a table or on a bed. Use what ever works the best and is most comfortable for you Knee exercises include:  Leg Lifts - While your knee is still immobilized in a splint or cast, you can do straight leg raises. Lift the leg to 60 degrees, hold for 3 sec, and slowly lower the leg. Repeat 10-20 times 2-3 times daily. Perform this exercise against resistance later as your knee gets better.  Quad and Hamstring Sets - Tighten up the muscle on the front of the thigh (Quad) and hold for 5-10 sec. Repeat this 10-20 times hourly. Hamstring sets are done by pushing the foot backward against an object and holding for 5-10 sec. Repeat as with quad sets.  Leg Slides: Lying on your back, slowly slide your foot toward your buttocks, bending your knee up off the floor (only go as far as is comfortable). Then slowly slide your foot back down until your leg is flat on the floor again. Angel Wings: Lying on your back spread your legs to the side as far apart as you can without causing discomfort.  A rehabilitation program following serious knee injuries can speed recovery and prevent re-injury in the future due to weakened muscles. Contact your doctor or a physical therapist for more information on knee rehabilitation.   IF YOU ARE TRANSFERRED TO A SKILLED REHAB FACILITY If the patient is transferred to a skilled rehab facility following release from the hospital, a list of the current medications will be sent to the facility for the patient to continue.  When discharged from the skilled rehab facility, please have the facility set up the patient's  [place] prior to being released. Also, the skilled facility will be responsible for providing the patient with their medications at time of release from the facility to include their pain medication, the muscle relaxants, and their blood thinner medication. If the patient is still at the rehab facility at time of the two week follow up appointment, the skilled rehab facility will also need to assist the patient in arranging follow up appointment in our office and any transportation needs.  MAKE SURE YOU:  Understand these instructions.  Get help right away if you are not doing well or get worse.    Pick up stool softner and laxative for home use following surgery while on pain medications. Do not submerge incision under water. Please use good hand washing techniques while changing dressing each day. May shower starting three days after surgery. Please use a  clean towel to pat the incision dry following showers. Continue to use ice for pain and swelling after surgery. Do not use any lotions or creams on the incision until instructed by your surgeon.   Do not put a pillow under the knee. Place it under the heel.   Complete by:  As directed    Driving restrictions   Complete by:  As directed    No driving for two weeks   TED hose   Complete by:  As directed    Use stockings (TED hose) for three weeks on both leg(s).  You may remove them at night for sleeping.   Weight bearing as tolerated   Complete by:  As directed      Allergies as of 11/04/2018      Reactions   Adhesive [tape] Other (See Comments)   Skin turns red   Diphenhydramine Other (See Comments)   Pt feels wired, hyperactive   Vicodin [hydrocodone-acetaminophen] Nausea And Vomiting      Medication List    TAKE these medications   accu-chek soft touch lancets Use to check BS QAM DX: E11.9   acetaminophen 500 MG tablet Commonly known as:  TYLENOL Take 500 mg by mouth daily as needed for mild pain.    atorvastatin 20 MG tablet Commonly known as:  LIPITOR TAKE 2 TABLETS (40 MG TOTAL) BY MOUTH EVERY EVENING. What changed:  See the new instructions.   carvedilol 12.5 MG tablet Commonly known as:  COREG TAKE 1 TABLET IN THE MORNING AND 1.5 TABLETS IN THE EVENING What changed:  See the new instructions.   colesevelam 625 MG tablet Commonly known as:  WELCHOL TAKE 3 TABLETS (1,875 MG TOTAL) BY MOUTH 2 (TWO) TIMES DAILY WITH A MEAL. What changed:  See the new instructions.   DEXILANT 60 MG capsule Generic drug:  dexlansoprazole TAKE ONE CAPSULE BY MOUTH EVERY DAY What changed:  how much to take   ELIQUIS 5 MG Tabs tablet Generic drug:  apixaban TAKE 1 TABLET BY MOUTH TWICE A DAY What changed:  how much to take   fluticasone 50 MCG/ACT nasal spray Commonly known as:  FLONASE USE 2 SPRAYS IN EACH NOSTRIL DAILY FOR 3 MONTHS What changed:    how much to take  how to take this  when to take this  reasons to take this   gabapentin 300 MG capsule Commonly known as:  NEURONTIN Take 1 capsule (300 mg total) by mouth 3 (three) times daily. Take a 300 mg capsule three times a day for two weeks following surgery.Then take a 300 mg capsule two times a day for two weeks. Then take a 300 mg capsule once a day for two weeks. Then discontinue.   glucose blood test strip Use to check BS QAM DX: E11.9   glucose blood test strip Commonly known as:  ACCU-CHEK AVIVA Check FBS QAM - DX:E11.9   HYDROmorphone 2 MG tablet Commonly known as:  DILAUDID Take 1-2 tablets (2-4 mg total) by mouth every 6 (six) hours as needed for severe pain.   losartan 100 MG tablet Commonly known as:  COZAAR TAKE 1 TABLET BY MOUTH EVERY DAY   methocarbamol 500 MG tablet Commonly known as:  ROBAXIN Take 1 tablet (500 mg total) by mouth every 6 (six) hours as needed for muscle spasms.   ONE TOUCH ULTRA 2 w/Device Kit Check FBS qam - Dx:250.00 and she will need lancets (1 box) , strips (1 bottle = 100) and  controls  also thanks   [place] w/Device Kit Use to check FBS QAM - DX:E11.9   spironolactone 25 MG tablet Commonly known as:  ALDACTONE TAKE 1 TABLET BY MOUTH EVERY DAY   SYMBICORT IN Inhale 1 puff into the lungs daily as needed (shortness of breath).   traMADol 50 MG tablet Commonly known as:  ULTRAM Take 1-2 tablets (50-100 mg total) by mouth every 6 (six) hours as needed for moderate pain.   zafirlukast 20 MG tablet Commonly known as:  ACCOLATE TAKE 1 TABLET (20 MG TOTAL) BY MOUTH 2 (TWO) TIMES DAILY. What changed:  See the new instructions.            Discharge Care Instructions  (From admission, onward)         Start     Ordered   11/01/18 0000  Weight bearing as tolerated     11/01/18 0750   11/01/18 0000  Change dressing    Comments:  Change the dressing daily with sterile 4 x 4 inch gauze dressing and apply TED hose.   11/01/18 0750         Follow-up Information    Gaynelle Arabian, MD. Schedule an appointment as soon as possible for a visit on 11/15/2018.   Specialty:  Orthopedic Surgery Contact information: [address] [place] [address] [phone]        [facility] Follow up.   Specialty:  [place] Why:  physical therapy Contact information: [place] [address] [phone]           Signed: Theresa Duty, PA-C Orthopedic Surgery 11/07/2018, 9:07 AM

## 2018-11-10 DIAGNOSIS — I480 Paroxysmal atrial fibrillation: Secondary | ICD-10-CM | POA: Diagnosis not present

## 2018-11-10 DIAGNOSIS — J45909 Unspecified asthma, uncomplicated: Secondary | ICD-10-CM | POA: Diagnosis not present

## 2018-11-10 DIAGNOSIS — E119 Type 2 diabetes mellitus without complications: Secondary | ICD-10-CM | POA: Diagnosis not present

## 2018-11-10 DIAGNOSIS — E785 Hyperlipidemia, unspecified: Secondary | ICD-10-CM | POA: Diagnosis not present

## 2018-11-10 DIAGNOSIS — Z471 Aftercare following joint replacement surgery: Secondary | ICD-10-CM | POA: Diagnosis not present

## 2018-11-10 DIAGNOSIS — I10 Essential (primary) hypertension: Secondary | ICD-10-CM | POA: Diagnosis not present

## 2018-11-11 DIAGNOSIS — I10 Essential (primary) hypertension: Secondary | ICD-10-CM | POA: Diagnosis not present

## 2018-11-11 DIAGNOSIS — J45909 Unspecified asthma, uncomplicated: Secondary | ICD-10-CM | POA: Diagnosis not present

## 2018-11-11 DIAGNOSIS — E119 Type 2 diabetes mellitus without complications: Secondary | ICD-10-CM | POA: Diagnosis not present

## 2018-11-11 DIAGNOSIS — E785 Hyperlipidemia, unspecified: Secondary | ICD-10-CM | POA: Diagnosis not present

## 2018-11-11 DIAGNOSIS — I480 Paroxysmal atrial fibrillation: Secondary | ICD-10-CM | POA: Diagnosis not present

## 2018-11-11 DIAGNOSIS — Z471 Aftercare following joint replacement surgery: Secondary | ICD-10-CM | POA: Diagnosis not present

## 2018-11-14 DIAGNOSIS — J45909 Unspecified asthma, uncomplicated: Secondary | ICD-10-CM | POA: Diagnosis not present

## 2018-11-14 DIAGNOSIS — Z471 Aftercare following joint replacement surgery: Secondary | ICD-10-CM | POA: Diagnosis not present

## 2018-11-14 DIAGNOSIS — E785 Hyperlipidemia, unspecified: Secondary | ICD-10-CM | POA: Diagnosis not present

## 2018-11-14 DIAGNOSIS — I480 Paroxysmal atrial fibrillation: Secondary | ICD-10-CM | POA: Diagnosis not present

## 2018-11-14 DIAGNOSIS — I10 Essential (primary) hypertension: Secondary | ICD-10-CM | POA: Diagnosis not present

## 2018-11-14 DIAGNOSIS — E119 Type 2 diabetes mellitus without complications: Secondary | ICD-10-CM | POA: Diagnosis not present

## 2018-11-16 DIAGNOSIS — I480 Paroxysmal atrial fibrillation: Secondary | ICD-10-CM | POA: Diagnosis not present

## 2018-11-16 DIAGNOSIS — E119 Type 2 diabetes mellitus without complications: Secondary | ICD-10-CM | POA: Diagnosis not present

## 2018-11-16 DIAGNOSIS — E785 Hyperlipidemia, unspecified: Secondary | ICD-10-CM | POA: Diagnosis not present

## 2018-11-16 DIAGNOSIS — I10 Essential (primary) hypertension: Secondary | ICD-10-CM | POA: Diagnosis not present

## 2018-11-16 DIAGNOSIS — Z471 Aftercare following joint replacement surgery: Secondary | ICD-10-CM | POA: Diagnosis not present

## 2018-11-16 DIAGNOSIS — J45909 Unspecified asthma, uncomplicated: Secondary | ICD-10-CM | POA: Diagnosis not present

## 2018-11-17 DIAGNOSIS — M25562 Pain in left knee: Secondary | ICD-10-CM | POA: Diagnosis not present

## 2018-11-21 ENCOUNTER — Emergency Department (HOSPITAL_COMMUNITY)
Admission: EM | Admit: 2018-11-21 | Discharge: 2018-11-21 | Disposition: A | Discharge status: Home / Self Care | Payer: Medicare Other | Attending: Emergency Medicine | Admitting: Emergency Medicine

## 2018-11-21 ENCOUNTER — Other Ambulatory Visit: Payer: Self-pay

## 2018-11-21 ENCOUNTER — Encounter (HOSPITAL_COMMUNITY): Payer: Self-pay

## 2018-11-21 DIAGNOSIS — R42 Dizziness and giddiness: Secondary | ICD-10-CM | POA: Diagnosis not present

## 2018-11-21 DIAGNOSIS — D0592 Unspecified type of carcinoma in situ of left breast: Secondary | ICD-10-CM | POA: Diagnosis not present

## 2018-11-21 DIAGNOSIS — Z96653 Presence of artificial knee joint, bilateral: Secondary | ICD-10-CM | POA: Insufficient documentation

## 2018-11-21 DIAGNOSIS — M25562 Pain in left knee: Secondary | ICD-10-CM | POA: Diagnosis not present

## 2018-11-21 DIAGNOSIS — M79605 Pain in left leg: Secondary | ICD-10-CM | POA: Diagnosis not present

## 2018-11-21 DIAGNOSIS — J45909 Unspecified asthma, uncomplicated: Secondary | ICD-10-CM | POA: Diagnosis not present

## 2018-11-21 DIAGNOSIS — D0591 Unspecified type of carcinoma in situ of right breast: Secondary | ICD-10-CM | POA: Insufficient documentation

## 2018-11-21 DIAGNOSIS — I10 Essential (primary) hypertension: Secondary | ICD-10-CM | POA: Diagnosis not present

## 2018-11-21 DIAGNOSIS — R52 Pain, unspecified: Secondary | ICD-10-CM | POA: Diagnosis not present

## 2018-11-21 DIAGNOSIS — R5381 Other malaise: Secondary | ICD-10-CM | POA: Diagnosis not present

## 2018-11-21 LAB — CBC WITH DIFFERENTIAL/PLATELET
Abs Immature Granulocytes: 0.02 10*3/uL (ref 0.00–0.07)
Basophils Absolute: 0 10*3/uL (ref 0.0–0.1)
Basophils Relative: 1 %
Eosinophils Absolute: 0.2 10*3/uL (ref 0.0–0.5)
Eosinophils Relative: 3 %
HCT: 29.5 % — ABNORMAL LOW (ref 36.0–46.0)
Hemoglobin: 9.4 g/dL — ABNORMAL LOW (ref 12.0–15.0)
Immature Granulocytes: 0 %
Lymphocytes Relative: 20 %
Lymphs Abs: 1.3 10*3/uL (ref 0.7–4.0)
MCH: 30.8 pg (ref 26.0–34.0)
MCHC: 31.9 g/dL (ref 30.0–36.0)
MCV: 96.7 fL (ref 80.0–100.0)
MONOS PCT: 10 %
Monocytes Absolute: 0.6 10*3/uL (ref 0.1–1.0)
NEUTROS PCT: 66 %
Neutro Abs: 4.3 10*3/uL (ref 1.7–7.7)
Platelets: 430 10*3/uL — ABNORMAL HIGH (ref 150–400)
RBC: 3.05 MIL/uL — ABNORMAL LOW (ref 3.87–5.11)
RDW: 12.3 % (ref 11.5–15.5)
WBC: 6.5 10*3/uL (ref 4.0–10.5)
nRBC: 0 % (ref 0.0–0.2)

## 2018-11-21 LAB — COMPREHENSIVE METABOLIC PANEL
ALT: 12 U/L (ref 0–44)
AST: 14 U/L — ABNORMAL LOW (ref 15–41)
Albumin: 3.6 g/dL (ref 3.5–5.0)
Alkaline Phosphatase: 96 U/L (ref 38–126)
Anion gap: 8 (ref 5–15)
BUN: 21 mg/dL (ref 8–23)
CHLORIDE: 101 mmol/L (ref 98–111)
CO2: 24 mmol/L (ref 22–32)
Calcium: 9.1 mg/dL (ref 8.9–10.3)
Creatinine, Ser: 0.93 mg/dL (ref 0.44–1.00)
GFR calc Af Amer: >60 mL/min (ref >60)
Glucose, Bld: 128 mg/dL — ABNORMAL HIGH (ref 70–99)
Potassium: 4 mmol/L (ref 3.5–5.1)
Sodium: 133 mmol/L — ABNORMAL LOW (ref 135–145)
Total Bilirubin: 0.5 mg/dL (ref 0.3–1.2)
Total Protein: 6.8 g/dL (ref 6.5–8.1)

## 2018-11-21 MED ORDER — ONDANSETRON 4 MG PO TBDP: ORAL_TABLET | ORAL | 0 refills | Status: DC

## 2018-11-21 MED ORDER — ONDANSETRON 4 MG PO TBDP
4.0000 mg | ORAL_TABLET | Freq: Once | ORAL | Status: AC
  Administered 2018-11-21: 4 mg via ORAL
  Filled 2018-11-21: qty 1

## 2018-11-21 NOTE — ED Notes (Signed)
Bed: GG26 Expected date:  Expected time:  Means of arrival:  Comments: Ems dvt?

## 2018-11-21 NOTE — ED Notes (Signed)
Pt c/o some nausea

## 2018-11-21 NOTE — ED Triage Notes (Signed)
Pt BIBA from [facility]. Pt had total left knee replacement 3 weeks ago. Pt had tenderness in left calf. Pt is on eliquis already. Sent by office for r/o DVT

## 2018-11-21 NOTE — ED Provider Notes (Signed)
[facility] DEPT Provider Note   CSN: [id number] Arrival date & time: 11/21/18  0957     History   Chief Complaint Chief Complaint  Patient presents with  . Leg Pain    HPI [name] is a 73 y.o. female.  Patient states she was at rehab.  And the rehab person found that her oxygen level was a little bit low.  She recently had surgery on her left knee.  Patient takes Eliquis.  She states her knee hurts but does not seem to be any worse than it normally has been.  Similar to her right knee when she had surgery  The history is provided by the patient. No language interpreter was used.  Leg Pain  Location:  Knee Injury: yes   Mechanism of injury comment:  Surgery Knee location:  L knee Pain details:    Quality:  Aching   Radiates to:  Does not radiate   Severity:  Mild   Onset quality:  Gradual   Timing:  Constant   Progression:  Waxing and waning Chronicity:  New Dislocation: no   Associated symptoms: no back pain and no fatigue     Past Medical History:  Diagnosis Date  . Allergy history unknown   . Arthritis    oa  . Asthma    takes acolate for  . Breast cancer ([place]) 1999; 2008   hx of bilateral breast cancer  . Breast cancer, left breast ([place]) 10/27/2012   3.2 cm ER positive, Her-2 negative, 1 node positive S/P mastectomy/TTRAM reconstruction 4/08  . Breast cancer, right breast ([place]) 10/27/2012   3.2 cm ER/PR positive, 1 node positive, Her-2 negative Rx w mastectomy, AC -T chemo, followed by aromatase inhibitor x 5 years dx 4/08  . Cancer [facility])    breast CA  . Dehydration after exertion    after she went berry picking in the summer heat . see ED visit   . Diabetes ([place])   . DM2 (diabetes mellitus, type 2) ([place])    on welchol for  . Dyspnea    with exertion  . Elevated cholesterol   . GERD (gastroesophageal reflux disease)   . Hiatal hernia   . Hypertension   . LBBB (left bundle branch block) 10/27/2012  . Leg cramps     . NICM (nonischemic cardiomyopathy) ([place])    a. Echo:  06/14/12: Poor endocardial definition, possible septal HK, EF 50-55%, normal wall motion, mild LAE ;  b.  [facility] 9/13:  normal cors, EF 35-40%,  c. Echo 7/14: EF 50-55%, mild LAE  . Other fatigue 01/01/2015  . Pulmonary embolus ([place]) 2001   after tram flap surgery    Patient Active Problem List   Diagnosis Date Noted  . OA (osteoarthritis) of knee 03/07/2018  . Genetic testing 06/14/2015  . Paroxysmal atrial fibrillation ([place]) 02/12/2015  . Other fatigue 01/01/2015  . Cough 11/07/2012  . Asthma 11/07/2012  . LBBB (left bundle branch block) 10/27/2012  . Breast cancer, left breast ([place]) 10/27/2012  . Breast cancer, right breast ([place]) 10/27/2012  . Nonischemic cardiomyopathy ([place]) 09/21/2012  . Chest pain 07/20/2012  . DM w/o complication type II ([place]) 10/30/2011  . DYSPNEA 07/25/2009  . Hyperlipidemia 09/19/2008  . Essential hypertension 09/19/2008    Past Surgical History:  Procedure Laterality Date  . DILATION AND CURETTAGE OF UTERUS   24yr ago   x 2  . KNEE CLOSED REDUCTION Right 06/01/2018   Procedure: CLOSED MANIPULATION RIGHT  KNEE;  Surgeon: Gaynelle Arabian, MD;  Location: [facility] ORS;  Service: Orthopedics;  Laterality: Right;  . KNEE SURGERY  2006   left torn cartilage repair  . LEFT HEART CATHETERIZATION WITH CORONARY ANGIOGRAM N/A 06/15/2012   Procedure: LEFT HEART CATHETERIZATION WITH CORONARY ANGIOGRAM;  Surgeon: Wellington Hampshire, MD;  Location: [facility] CATH LAB;  Service: Cardiovascular;  Laterality: N/A;  . MASTECTOMY     bilateral  . RECONSTRUCTION BREAST W/ TRAM FLAP  2001  . TOTAL KNEE ARTHROPLASTY Right 03/07/2018   Procedure: RIGHT TOTAL KNEE ARTHROPLASTY;  Surgeon: Gaynelle Arabian, MD;  Location: [facility] ORS;  Service: Orthopedics;  Laterality: Right;  . TOTAL KNEE ARTHROPLASTY Left 10/31/2018   Procedure: TOTAL KNEE ARTHROPLASTY;  Surgeon: Gaynelle Arabian, MD;  Location: [facility] ORS;  Service: Orthopedics;  Laterality: Left;   39mn     OB History   No obstetric history on file.      Home Medications    Prior to Admission medications   Medication Sig Start Date End Date Taking? Authorizing Provider  acetaminophen (TYLENOL) 500 MG tablet Take 500-1,000 mg by mouth every 6 (six) hours as needed for mild pain or moderate pain.    Yes [provider]  atorvastatin (LIPITOR) 20 MG tablet TAKE 2 TABLETS (40 MG TOTAL) BY MOUTH EVERY EVENING. Patient taking differently: Take 40 mg by mouth every evening.  06/24/18  Yes Pickard, WCammie Mcgee MD  Budesonide-Formoterol Fumarate (SYMBICORT IN) Inhale 1 puff into the lungs daily as needed (shortness of breath).   Yes [provider]  carvedilol (COREG) 12.5 MG tablet TAKE 1 TABLET IN THE MORNING AND 1.5 TABLETS IN THE EVENING Patient taking differently: Take 12.5-18.75 mg by mouth See admin instructions. Take 12.5 mg by mouth in the morning and 18.75 mg in the evening 02/24/18  Yes Bensimhon, DShaune Pascal MD  colesevelam (WELCHOL) 625 MG tablet TAKE 3 TABLETS (1,875 MG TOTAL) BY MOUTH 2 (TWO) TIMES DAILY WITH A MEAL. Patient taking differently: Take 1,875 mg by mouth 2 (two) times daily with a meal.  01/31/18  Yes Pickard, WCammie Mcgee MD  DEXILANT 60 MG capsule TAKE ONE CAPSULE BY MOUTH EVERY DAY Patient taking differently: Take 60 mg by mouth daily.  09/05/18  Yes Pickard, WCammie Mcgee MD  ELIQUIS 5 MG TABS tablet TAKE 1 TABLET BY MOUTH TWICE A DAY Patient taking differently: Take 5 mg by mouth 2 (two) times daily.  02/04/18  Yes MLarey Dresser MD  fluticasone (FLONASE) 50 MCG/ACT nasal spray USE 2 SPRAYS IN EACH NOSTRIL DAILY FOR 3 MONTHS Patient taking differently: Place 2 sprays into both nostrils daily. USE 2 SPRAYS IN EACH NOSTRIL DAILY FOR 3 MONTHS 12/10/17  Yes PSusy Frizzle MD  GLUCOSAMINE CHONDROITIN COMPLX PO Take 1 tablet by mouth 2 (two) times daily.   Yes [provider]  losartan (COZAAR) 100 MG tablet TAKE 1 TABLET BY MOUTH EVERY DAY Patient  taking differently: Take 100 mg by mouth daily.  07/20/18  Yes MLarey Dresser MD  spironolactone (ALDACTONE) 25 MG tablet TAKE 1 TABLET BY MOUTH EVERY DAY Patient taking differently: Take 25 mg by mouth daily.  01/27/18  Yes Bensimhon, DShaune Pascal MD  traMADol (ULTRAM) 50 MG tablet Take 1-2 tablets (50-100 mg total) by mouth every 6 (six) hours as needed for moderate pain. 11/02/18  Yes Edmisten, Kristie L, PA  zafirlukast (ACCOLATE) 20 MG tablet TAKE 1 TABLET (20 MG TOTAL) BY MOUTH 2 (TWO) TIMES DAILY. Patient taking differently: Take  20 mg by mouth 2 (two) times daily before a meal.  04/25/18  Yes Pickard, Cammie Mcgee, MD  Blood Glucose Monitoring Suppl (ACCU-CHEK AVIVA PLUS) w/Device KIT Use to check FBS QAM - DX:E11.9 09/21/18   Susy Frizzle, MD  Blood Glucose Monitoring Suppl (ONE TOUCH ULTRA 2) W/DEVICE KIT Check FBS qam - Dx:250.00 and she will need lancets (1 box) , strips (1 bottle = 100) and controls also thanks 09/01/13   Susy Frizzle, MD  gabapentin (NEURONTIN) 300 MG capsule Take 1 capsule (300 mg total) by mouth 3 (three) times daily. Take a 300 mg capsule three times a day for two weeks following surgery.Then take a 300 mg capsule two times a day for two weeks. Then take a 300 mg capsule once a day for two weeks. Then discontinue. Patient not taking: Reported on 11/21/2018 11/02/18   Theresa Duty L, PA  glucose blood (ACCU-CHEK AVIVA) test strip Check FBS QAM - DX:E11.9 09/21/18   Susy Frizzle, MD  glucose blood test strip Use to check BS QAM DX: E11.9 09/07/18   Susy Frizzle, MD  HYDROmorphone (DILAUDID) 2 MG tablet Take 1-2 tablets (2-4 mg total) by mouth every 6 (six) hours as needed for severe pain. Patient not taking: Reported on 11/21/2018 11/02/18   Edmisten, Ok Anis, PA  Lancets (ACCU-CHEK SOFT TOUCH) lancets Use to check BS QAM DX: E11.9 09/21/18   Susy Frizzle, MD  methocarbamol (ROBAXIN) 500 MG tablet Take 1 tablet (500 mg total) by mouth every 6 (six)  hours as needed for muscle spasms. Patient not taking: Reported on 11/21/2018 11/02/18   Derl Barrow, PA  ondansetron (ZOFRAN ODT) 4 MG disintegrating tablet 83m ODT q4 hours prn nausea/vomit 11/21/18   ZMilton Ferguson MD    Family History Family History  Problem Relation Age of Onset  . Asthma Maternal Grandmother   . Breast cancer Maternal Grandmother        dx. 757s . Stomach cancer Maternal Grandmother 60       lots of stomach problems  . Heart attack Paternal Grandfather   . Other Daughter        cyst on ovary; unilateral oophorectomy  . Heart attack Brother   . Cancer Maternal Aunt        unknown type  . Celiac disease Maternal Aunt   . Diabetes Maternal Uncle   . Colon cancer Cousin        dx. 50s-60s  . Colon cancer Paternal Aunt        dx. late 660s . Lung cancer Paternal Aunt        dx. late 60s-70s; former smoker  . Heart attack Paternal Aunt   . Heart attack Paternal Uncle     Social History Social History   Tobacco Use  . Smoking status: Never Smoker  . Smokeless tobacco: Never Used  Substance Use Topics  . Alcohol use: No  . Drug use: No     Allergies   Adhesive [tape]; Diphenhydramine; and Vicodin [hydrocodone-acetaminophen]   Review of Systems Review of Systems  Constitutional: Negative for appetite change and fatigue.  HENT: Negative for congestion, ear discharge and sinus pressure.   Eyes: Negative for discharge.  Respiratory: Negative for cough.   Cardiovascular: Negative for chest pain.  Gastrointestinal: Negative for abdominal pain and diarrhea.  Genitourinary: Negative for frequency and hematuria.  Musculoskeletal: Negative for back pain.       Knee pain  Skin: Negative  for rash.  Neurological: Negative for seizures and headaches.  Psychiatric/Behavioral: Negative for hallucinations.     Physical Exam Updated Vital Signs BP 100/66   Pulse 73   Temp (!) 97.5 F (36.4 C) (Oral)   Resp 16   Ht _0  (1.575 m)   Wt 74.8 kg    SpO2 100%   BMI 30.18 kg/m   Physical Exam Vitals signs and nursing note reviewed.  Constitutional:      Appearance: She is well-developed.  HENT:     Head: Normocephalic.     Nose: Nose normal.  Eyes:     General: No scleral icterus.    Conjunctiva/sclera: Conjunctivae normal.  Neck:     Musculoskeletal: Neck supple.     Thyroid: No thyromegaly.  Cardiovascular:     Rate and Rhythm: Normal rate and regular rhythm.     Heart sounds: No murmur. No friction rub. No gallop.   Pulmonary:     Breath sounds: No stridor. No wheezing or rales.  Chest:     Chest wall: No tenderness.  Abdominal:     General: There is no distension.     Tenderness: There is no abdominal tenderness. There is no rebound.  Musculoskeletal: Normal range of motion.     Comments: Swollen tender left knee consistent with recent surgery.  Patient's calf is nontender  Lymphadenopathy:     Cervical: No cervical adenopathy.  Skin:    Findings: No erythema or rash.  Neurological:     Mental Status: She is oriented to person, place, and time.     Motor: No abnormal muscle tone.     Coordination: Coordination normal.  Psychiatric:        Behavior: Behavior normal.      ED Treatments / Results  Labs (all labs ordered are listed, but only abnormal results are displayed) Labs Reviewed  CBC WITH DIFFERENTIAL/PLATELET - Abnormal; Notable for the following components:      Result Value   RBC 3.05 (*)    Hemoglobin 9.4 (*)    HCT 29.5 (*)    Platelets 430 (*)    All other components within normal limits  COMPREHENSIVE METABOLIC PANEL - Abnormal; Notable for the following components:   Sodium 133 (*)    Glucose, Bld 128 (*)    AST 14 (*)    All other components within normal limits    EKG None  Radiology No results found.  Procedures Procedures (including critical care time)  Medications Ordered in ED Medications  ondansetron (ZOFRAN-ODT) disintegrating tablet 4 mg (4 mg Oral Given 11/21/18  1155)     Initial Impression / Assessment and Plan / ED Course  I have reviewed the triage vital signs and the nursing notes.  Pertinent labs & imaging results that were available during my care of the patient were reviewed by me and considered in my medical decision making (see chart for details).     Patient's O2 sat is been fine here.  I doubt the patient has a DVT.  She is already on Eliquis.  I offered her an ultrasound but she did not want to get it.  She states that her leg looks similar to the right knee when she had surgery.  She will follow-up with her orthopedic doctor  Final Clinical Impressions(s) / ED Diagnoses   Final diagnoses:  Left leg pain    ED Discharge Orders         Ordered    ondansetron (ZOFRAN ODT)  4 MG disintegrating tablet     11/21/18 1208           Milton Ferguson, MD 11/21/18 1215

## 2018-11-21 NOTE — Discharge Instructions (Addendum)
Follow-up with Dr. Jeffie Pollock if any problem

## 2018-11-23 DIAGNOSIS — M25562 Pain in left knee: Secondary | ICD-10-CM | POA: Diagnosis not present

## 2018-11-25 DIAGNOSIS — M25562 Pain in left knee: Secondary | ICD-10-CM | POA: Diagnosis not present

## 2018-11-28 DIAGNOSIS — M25562 Pain in left knee: Secondary | ICD-10-CM | POA: Diagnosis not present

## 2018-11-30 DIAGNOSIS — M25562 Pain in left knee: Secondary | ICD-10-CM | POA: Diagnosis not present

## 2018-12-02 DIAGNOSIS — M25562 Pain in left knee: Secondary | ICD-10-CM | POA: Diagnosis not present

## 2018-12-06 DIAGNOSIS — S8001XA Contusion of right knee, initial encounter: Secondary | ICD-10-CM | POA: Diagnosis not present

## 2018-12-06 DIAGNOSIS — Z471 Aftercare following joint replacement surgery: Secondary | ICD-10-CM | POA: Diagnosis not present

## 2018-12-06 DIAGNOSIS — Z96652 Presence of left artificial knee joint: Secondary | ICD-10-CM | POA: Diagnosis not present

## 2018-12-07 DIAGNOSIS — M25562 Pain in left knee: Secondary | ICD-10-CM | POA: Diagnosis not present

## 2018-12-09 DIAGNOSIS — M25562 Pain in left knee: Secondary | ICD-10-CM | POA: Diagnosis not present

## 2018-12-12 DIAGNOSIS — M25562 Pain in left knee: Secondary | ICD-10-CM | POA: Diagnosis not present

## 2018-12-14 DIAGNOSIS — M25562 Pain in left knee: Secondary | ICD-10-CM | POA: Diagnosis not present

## 2018-12-26 ENCOUNTER — Ambulatory Visit (HOSPITAL_COMMUNITY): Admission: RE | Admit: 2018-12-26 | Payer: Medicare Other | Source: Home / Self Care | Admitting: Orthopedic Surgery

## 2018-12-26 ENCOUNTER — Encounter (HOSPITAL_COMMUNITY): Admission: RE | Payer: Self-pay | Source: Home / Self Care

## 2018-12-26 SURGERY — MANIPULATION, KNEE, CLOSED
Anesthesia: Choice | Laterality: Left

## 2019-01-18 ENCOUNTER — Other Ambulatory Visit (HOSPITAL_COMMUNITY): Payer: Self-pay | Admitting: Cardiology

## 2019-01-18 ENCOUNTER — Other Ambulatory Visit (HOSPITAL_COMMUNITY): Payer: Self-pay | Admitting: Internal Medicine

## 2019-01-21 ENCOUNTER — Other Ambulatory Visit: Payer: Self-pay | Admitting: Internal Medicine

## 2019-02-09 ENCOUNTER — Other Ambulatory Visit (HOSPITAL_COMMUNITY): Payer: Self-pay | Admitting: Cardiology

## 2019-02-17 ENCOUNTER — Other Ambulatory Visit: Payer: Self-pay

## 2019-02-17 ENCOUNTER — Other Ambulatory Visit (HOSPITAL_COMMUNITY)
Admission: RE | Admit: 2019-02-17 | Discharge: 2019-02-17 | Disposition: A | Discharge status: Home / Self Care | Payer: Medicare Other | Source: Ambulatory Visit | Attending: Orthopedic Surgery | Admitting: Orthopedic Surgery

## 2019-02-17 DIAGNOSIS — Z1159 Encounter for screening for other viral diseases: Secondary | ICD-10-CM | POA: Diagnosis not present

## 2019-02-17 NOTE — Progress Notes (Addendum)
NO SOLID FOOD AFTER MIDNIGHT THE NIGHT PRIOR TO SURGERY. NOTHING BY MOUTH EXCEPT CLEAR LIQUIDS UNTIL 3 HOURS PRIOR TO [place] SURGERY. PLEASE FINISH ENSURE DRINK PER SURGEON ORDER    WHICH NEEDS TO BE COMPLETED AT  0430 am.  You may drink clear liquids from 12 midnight  up until 0745 am  Then nothing until after surgery!   CLEAR LIQUID DIET   Foods Allowed                                                                     Foods Excluded  Coffee and tea, regular and decaf                             liquids that you cannot  Plain Jell-O in any flavor                                             see through such as: Fruit ices (not with fruit pulp)                                     milk, soups, orange juice  Iced Popsicles                                    All solid food Carbonated beverages, regular and diet                                    Cranberry, grape and apple juices Sports drinks like Gatorade Lightly seasoned clear broth or consume(fat free) Sugar, honey syrup  Sample Menu Breakfast                                Lunch                                     Supper Cranberry juice                    Beef broth                            Chicken broth Jell-O                                     Grape juice                           Apple juice Coffee or tea                        Jell-O  Popsicle                                                Coffee or tea                        Coffee or tea  _____________________________________________________________________

## 2019-02-18 LAB — NOVEL CORONAVIRUS, NAA (HOSP ORDER, SEND-OUT TO REF LAB; TAT 18-24 HRS): SARS-CoV-2, NAA: NOT DETECTED

## 2019-02-20 ENCOUNTER — Other Ambulatory Visit: Payer: Self-pay

## 2019-02-20 ENCOUNTER — Encounter (HOSPITAL_COMMUNITY): Payer: Self-pay | Admitting: *Deleted

## 2019-02-20 NOTE — Progress Notes (Signed)
SPOKE WITH JESSICA ZANETTO PA NO LABS NEEDED JUST GET [facility] FOR 02-22-2019 SURGERY.PATIENT AWARE SURGERY TIME CHANGED NPO AFTER MIDNIGHT FOOD, CLEAR LIQUIDS UNTIL 430 AM AND DRINK G2 DRINK AT 430 AM, THEN NPO

## 2019-02-22 ENCOUNTER — Ambulatory Visit (HOSPITAL_COMMUNITY): Payer: Medicare Other | Admitting: Physician Assistant

## 2019-02-22 ENCOUNTER — Ambulatory Visit (HOSPITAL_COMMUNITY)
Admission: RE | Admit: 2019-02-22 | Discharge: 2019-02-22 | Disposition: A | Discharge status: Home / Self Care | Payer: Medicare Other | Attending: Orthopedic Surgery | Admitting: Orthopedic Surgery

## 2019-02-22 ENCOUNTER — Encounter (HOSPITAL_COMMUNITY)
Admission: RE | Disposition: A | Discharge status: Home / Self Care | Payer: Self-pay | Source: Home / Self Care | Attending: Orthopedic Surgery

## 2019-02-22 ENCOUNTER — Encounter (HOSPITAL_COMMUNITY): Payer: Self-pay | Admitting: *Deleted

## 2019-02-22 ENCOUNTER — Telehealth (HOSPITAL_COMMUNITY): Payer: Self-pay | Admitting: *Deleted

## 2019-02-22 DIAGNOSIS — Z96653 Presence of artificial knee joint, bilateral: Secondary | ICD-10-CM | POA: Diagnosis not present

## 2019-02-22 DIAGNOSIS — E7800 Pure hypercholesterolemia, unspecified: Secondary | ICD-10-CM | POA: Insufficient documentation

## 2019-02-22 DIAGNOSIS — I10 Essential (primary) hypertension: Secondary | ICD-10-CM | POA: Insufficient documentation

## 2019-02-22 DIAGNOSIS — Z86711 Personal history of pulmonary embolism: Secondary | ICD-10-CM | POA: Diagnosis not present

## 2019-02-22 DIAGNOSIS — E119 Type 2 diabetes mellitus without complications: Secondary | ICD-10-CM | POA: Diagnosis not present

## 2019-02-22 DIAGNOSIS — J45909 Unspecified asthma, uncomplicated: Secondary | ICD-10-CM | POA: Insufficient documentation

## 2019-02-22 DIAGNOSIS — Z79899 Other long term (current) drug therapy: Secondary | ICD-10-CM | POA: Insufficient documentation

## 2019-02-22 DIAGNOSIS — Z853 Personal history of malignant neoplasm of breast: Secondary | ICD-10-CM | POA: Diagnosis not present

## 2019-02-22 DIAGNOSIS — K449 Diaphragmatic hernia without obstruction or gangrene: Secondary | ICD-10-CM | POA: Insufficient documentation

## 2019-02-22 DIAGNOSIS — I428 Other cardiomyopathies: Secondary | ICD-10-CM | POA: Insufficient documentation

## 2019-02-22 DIAGNOSIS — M24662 Ankylosis, left knee: Secondary | ICD-10-CM | POA: Insufficient documentation

## 2019-02-22 DIAGNOSIS — Z7901 Long term (current) use of anticoagulants: Secondary | ICD-10-CM | POA: Diagnosis not present

## 2019-02-22 DIAGNOSIS — I447 Left bundle-branch block, unspecified: Secondary | ICD-10-CM | POA: Insufficient documentation

## 2019-02-22 DIAGNOSIS — T8482XS Fibrosis due to internal orthopedic prosthetic devices, implants and grafts, sequela: Secondary | ICD-10-CM

## 2019-02-22 HISTORY — PX: KNEE CLOSED REDUCTION: SHX995

## 2019-02-22 HISTORY — DX: Other specified postprocedural states: Z98.890

## 2019-02-22 HISTORY — DX: Other specified postprocedural states: R11.2

## 2019-02-22 HISTORY — DX: Unspecified cataract: H26.9

## 2019-02-22 LAB — GLUCOSE, CAPILLARY: Glucose-Capillary: 109 mg/dL — ABNORMAL HIGH (ref 70–99)

## 2019-02-22 SURGERY — MANIPULATION, KNEE, CLOSED
Anesthesia: General | Site: Knee | Laterality: Left

## 2019-02-22 MED ORDER — PROPOFOL 10 MG/ML IV BOLUS
INTRAVENOUS | Status: DC | PRN
  Administered 2019-02-22: 120 mg via INTRAVENOUS

## 2019-02-22 MED ORDER — MIDAZOLAM HCL 2 MG/2ML IJ SOLN
INTRAMUSCULAR | Status: AC
  Filled 2019-02-22: qty 2

## 2019-02-22 MED ORDER — MEPERIDINE HCL 50 MG/ML IJ SOLN: 6.2500 mg | INTRAMUSCULAR | Status: DC | PRN

## 2019-02-22 MED ORDER — FENTANYL CITRATE (PF) 100 MCG/2ML IJ SOLN
INTRAMUSCULAR | Status: AC
  Filled 2019-02-22: qty 2

## 2019-02-22 MED ORDER — ACETAMINOPHEN 160 MG/5ML PO SOLN: 325.0000 mg | ORAL | Status: DC | PRN

## 2019-02-22 MED ORDER — OXYCODONE HCL 5 MG/5ML PO SOLN: 5.0000 mg | Freq: Once | ORAL | Status: DC | PRN

## 2019-02-22 MED ORDER — LACTATED RINGERS IV SOLN
INTRAVENOUS | Status: DC
  Administered 2019-02-22: 11:00:00 via INTRAVENOUS

## 2019-02-22 MED ORDER — ACETAMINOPHEN 325 MG PO TABS
325.0000 mg | ORAL_TABLET | ORAL | Status: DC | PRN
  Administered 2019-02-22: 650 mg via ORAL

## 2019-02-22 MED ORDER — CHLORHEXIDINE GLUCONATE 4 % EX LIQD
60.0000 mL | Freq: Once | CUTANEOUS | Status: AC
  Administered 2019-02-22: 4 via TOPICAL

## 2019-02-22 MED ORDER — SODIUM CHLORIDE 0.9 % IV SOLN: INTRAVENOUS | Status: DC

## 2019-02-22 MED ORDER — ONDANSETRON HCL 4 MG/2ML IJ SOLN
INTRAMUSCULAR | Status: DC | PRN
  Administered 2019-02-22: 4 mg via INTRAVENOUS

## 2019-02-22 MED ORDER — POVIDONE-IODINE 10 % EX SWAB: 2.0000 "application " | Freq: Once | CUTANEOUS | Status: DC

## 2019-02-22 MED ORDER — LIDOCAINE 2% (20 MG/ML) 5 ML SYRINGE
INTRAMUSCULAR | Status: AC
  Filled 2019-02-22: qty 5

## 2019-02-22 MED ORDER — ONDANSETRON HCL 4 MG/2ML IJ SOLN
INTRAMUSCULAR | Status: AC
  Filled 2019-02-22: qty 2

## 2019-02-22 MED ORDER — ACETAMINOPHEN 325 MG PO TABS
ORAL_TABLET | ORAL | Status: AC
  Filled 2019-02-22: qty 2

## 2019-02-22 MED ORDER — ONDANSETRON HCL 4 MG/2ML IJ SOLN: 4.0000 mg | Freq: Once | INTRAMUSCULAR | Status: DC | PRN

## 2019-02-22 MED ORDER — OXYCODONE HCL 5 MG PO TABS: 5.0000 mg | ORAL_TABLET | Freq: Once | ORAL | Status: DC | PRN

## 2019-02-22 MED ORDER — PROPOFOL 10 MG/ML IV BOLUS
INTRAVENOUS | Status: AC
  Filled 2019-02-22: qty 40

## 2019-02-22 MED ORDER — FENTANYL CITRATE (PF) 100 MCG/2ML IJ SOLN
25.0000 ug | INTRAMUSCULAR | Status: DC | PRN
  Administered 2019-02-22 (×2): 50 ug via INTRAVENOUS

## 2019-02-22 MED ORDER — LIDOCAINE 2% (20 MG/ML) 5 ML SYRINGE
INTRAMUSCULAR | Status: DC | PRN
  Administered 2019-02-22: 60 mg via INTRAVENOUS

## 2019-02-22 NOTE — Anesthesia Postprocedure Evaluation (Signed)
Anesthesia Post Note  Patient: [name]  Procedure(s) Performed: CLOSED MANIPULATION KNEE (Left Knee)     Patient location during evaluation: PACU Anesthesia Type: General Level of consciousness: awake and alert Pain management: pain level controlled Vital Signs Assessment: post-procedure vital signs reviewed and stable Respiratory status: spontaneous breathing, nonlabored ventilation, respiratory function stable and patient connected to nasal cannula oxygen Cardiovascular status: blood pressure returned to baseline and stable Postop Assessment: no apparent nausea or vomiting Anesthetic complications: no    Last Vitals:  Vitals:   02/22/19 1223 02/22/19 1243  BP: 134/62 136/67  Pulse: (!) 58 62  Resp: 15   Temp: 36.7 C 36.6 C  SpO2: 100% 100%    Last Pain:  Vitals:   02/22/19 1243  TempSrc:   PainSc: 2                  Genevive Printup

## 2019-02-22 NOTE — Anesthesia Preprocedure Evaluation (Addendum)
Anesthesia Evaluation  Patient identified by MRN, date of birth, ID band Patient awake    Reviewed: Allergy & Precautions, NPO status   History of Anesthesia Complications (+) PONV and history of anesthetic complications  Airway Mallampati: I  TM Distance: >3 FB Neck ROM: Full    Dental   Pulmonary shortness of breath, asthma ,    Pulmonary exam normal        Cardiovascular hypertension, Pt. on medications Normal cardiovascular exam+ dysrhythmias    EKG: 04/21/18 Rate 76 bpm Sinus rhythm Left bundle branch block   CV: Echo 01/28/18 Study Conclusions  - Left ventricle: The cavity size was normal. Wall thickness was increased in a pattern of mild LVH. Systolic function was normal. The estimated ejection fraction was in the range of 50% to 55%. Wall motion was normal; there were no regional wall motion abnormalities. Doppler parameters are consistent with abnormal left ventricular relaxation (grade 1 diastolic dysfunction). - Ventricular septum: Septal motion showed abnormal function and dyssynergy. These changes are consistent with a left bundle branch block. - Mitral valve: Mildly calcified annulus.    Neuro/Psych    GI/Hepatic GERD  Medicated and Controlled,  Endo/Other  diabetes, Type 2  Renal/GU      Musculoskeletal   Abdominal   Peds  Hematology   Anesthesia Other Findings   Reproductive/Obstetrics                            Anesthesia Physical  Anesthesia Plan  ASA: III  Anesthesia Plan: General   Post-op Pain Management:  Regional for Post-op pain   Induction: Intravenous  PONV Risk Score and Plan: 2 and Ondansetron and Midazolam  Airway Management Planned: Simple Face Mask and Mask  Additional Equipment:   Intra-op Plan:   Post-operative Plan:   Informed Consent: I have reviewed the patients History and Physical, chart, labs and discussed  the procedure including the risks, benefits and alternatives for the proposed anesthesia with the patient or authorized representative who has indicated his/her understanding and acceptance.       Plan Discussed with: CRNA, Surgeon and Anesthesiologist  Anesthesia Plan Comments: (See PST note 10/25/18, Konrad Felix, PA-C)        Anesthesia Quick Evaluation

## 2019-02-22 NOTE — Transfer of Care (Signed)
Immediate Anesthesia Transfer of Care Note  Patient: [name]  Procedure(s) Performed: CLOSED MANIPULATION KNEE (Left Knee)  Patient Location: PACU  Anesthesia Type:General  Level of Consciousness: awake, alert  and oriented  Airway & Oxygen Therapy: Patient Spontanous Breathing and Patient connected to face mask oxygen  Post-op Assessment: Report given to RN and Post -op Vital signs reviewed and stable  Post vital signs: Reviewed and stable  Last Vitals:  Vitals Value Taken Time  BP    Temp    Pulse 66 02/22/2019 11:33 AM  Resp 16 02/22/2019 11:33 AM  SpO2 100 % 02/22/2019 11:33 AM  Vitals shown include unvalidated device data.  Last Pain:  Vitals:   02/22/19 1028  TempSrc:   PainSc: 0-No pain      Patients Stated Pain Goal: 2 (14/78/29 5621)  Complications: No apparent anesthesia complications

## 2019-02-22 NOTE — Op Note (Signed)
  OPERATIVE REPORT   PREOPERATIVE DIAGNOSIS: Arthrofibrosis, Left  knee.   POSTOPERATIVE DIAGNOSIS: Arthrofibrosis, Left knee.   PROCEDURE:  Left  knee closed manipulation.   SURGEON: Gaynelle Arabian, MD   ASSISTANT: None.   ANESTHESIA: General.   COMPLICATIONS: None.   CONDITION: Stable to Recovery.   Pre-manipulation range of motion is 10-90.  Post-manipulation range of  Motion is 5-125  PROCEDURE IN DETAIL: After successful administration of general  anesthetic, exam under anesthesia was performed showing range of motion  10-90 degrees. I then placed my chest against the proximal tibia,  flexing the knee with audible lysis of adhesions. I was easily able to  get the knee flexed to 125  degrees. I then put the knee back in extension and with some  patellar manipulation and gentle pressure got within 5 degrees of full  Extension.The patient was subsequently awakened and transported to Recovery in  stable condition.

## 2019-02-22 NOTE — Progress Notes (Signed)
Pt states she has had bilateral mastectomies and tolerates IVs / BPs either arm.

## 2019-02-22 NOTE — H&P (Signed)
CC- CARMA DWIGGINS is a 73 y.o. female who presents with left knee stiffness.  HPI- . Knee Pain: Patient presents with stiffness involving the  left knee. Onset of the symptoms was several months ago. Inciting event: She had a left total knee arthroplasty and developed post-operative stiffness which has not responded to physical therapy. She has not been able to flex greater than 90 degrees and presents now for closed manipulation.   Past Medical History:  Diagnosis Date  . Allergy history unknown   . Arthritis    oa  . Asthma    takes acolate for  . Breast cancer ([place]) 1999; 2008   hx of bilateral breast cancer  . Breast cancer, left breast ([place]) 10/27/2012   3.2 cm ER positive, Her-2 negative, 1 node positive S/P mastectomy/TTRAM reconstruction 4/08  . Breast cancer, right breast ([place]) 10/27/2012   3.2 cm ER/PR positive, 1 node positive, Her-2 negative Rx w mastectomy, AC -T chemo, followed by aromatase inhibitor x 5 years dx 4/08  . Cancer [facility])    breast CA  . Cataracts, bilateral   . Dehydration after exertion    after she went berry picking in the summer heat . see ED visit   . Diabetes ([place])   . DM2 (diabetes mellitus, type 2) ([place])    on welchol for  . Dyspnea    with exertion  . Elevated cholesterol   . GERD (gastroesophageal reflux disease)   . Hiatal hernia   . Hypertension   . LBBB (left bundle branch block) 10/27/2012  . Leg cramps   . NICM (nonischemic cardiomyopathy) ([place])    a. Echo:  06/14/12: Poor endocardial definition, possible septal HK, EF 50-55%, normal wall motion, mild LAE ;  b.  [facility] 9/13:  normal cors, EF 35-40%,  c. Echo 7/14: EF 50-55%, mild LAE  . Other fatigue 01/01/2015  . PONV (postoperative nausea and vomiting)   . Pulmonary embolus ([place]) 2001   after tram flap surgery    Past Surgical History:  Procedure Laterality Date  . DILATION AND CURETTAGE OF UTERUS   39yr ago   x 2  . KNEE CLOSED REDUCTION Right 06/01/2018   Procedure: CLOSED  MANIPULATION RIGHT KNEE;  Surgeon: AGaynelle Arabian MD;  [facility];  Service: Orthopedics;  Laterality: Right;  . KNEE SURGERY  2006   left torn cartilage repair  . LEFT HEART CATHETERIZATION WITH CORONARY ANGIOGRAM N/A 06/15/2012   Procedure: LEFT HEART CATHETERIZATION WITH CORONARY ANGIOGRAM;  Surgeon: MWellington Hampshire MD;  [facility];  Service: Cardiovascular;  Laterality: N/A;  . MASTECTOMY     bilateral  . RECONSTRUCTION BREAST W/ TRAM FLAP Left 2001  . TOTAL KNEE ARTHROPLASTY Right 03/07/2018   Procedure: RIGHT TOTAL KNEE ARTHROPLASTY;  Surgeon: AGaynelle Arabian MD;  [facility];  Service: Orthopedics;  Laterality: Right;  . TOTAL KNEE ARTHROPLASTY Left 10/31/2018   Procedure: TOTAL KNEE ARTHROPLASTY;  Surgeon: AGaynelle Arabian MD;  [facility];  Service: Orthopedics;  Laterality: Left;  56m    Prior to Admission medications   Medication Sig Start Date End Date Taking? Authorizing Provider  acetaminophen (TYLENOL) 500 MG tablet Take 1,000 mg by mouth every 6 (six) hours as needed for mild pain or moderate pain.    Yes [provider]  apixaban (ELIQUIS) 5 MG TABS tablet Take 1 tablet (5 mg total) by mouth 2 (two) times daily. 01/18/19  Yes McLarey DresserMD  Ascorbic Acid (VITAMIN C  WITH ROSE HIPS) 500 MG tablet Take 500 mg by mouth daily.   Yes [provider]  atorvastatin (LIPITOR) 20 MG tablet TAKE 2 TABLETS (40 MG TOTAL) BY MOUTH EVERY EVENING. Patient taking differently: Take 40 mg by mouth every evening.  06/24/18  Yes Pickard, Cammie Mcgee, MD  carvedilol (COREG) 12.5 MG tablet TAKE 1 TABLET IN THE MORNING AND 1.5 TABLETS IN THE EVENING Patient taking differently: Take 12.5-18.75 mg by mouth See admin instructions. Take 1 tablet (12.5 mg) by mouth in the morning & take 1.5 tablet (18.75 mg) by mouth in the evening. 01/23/19  Yes Bensimhon, Shaune Pascal, MD  clobetasol cream (TEMOVATE) 4.62 % Apply 1 application topically daily as needed (for  itchy/discomfort).   Yes [provider]  colesevelam (WELCHOL) 625 MG tablet TAKE 3 TABLETS (1,875 MG TOTAL) BY MOUTH 2 (TWO) TIMES DAILY WITH A MEAL. Patient taking differently: Take 1,875 mg by mouth 2 (two) times daily with a meal.  01/31/18  Yes Pickard, Cammie Mcgee, MD  DEXILANT 60 MG capsule TAKE ONE CAPSULE BY MOUTH EVERY DAY Patient taking differently: Take 60 mg by mouth daily.  09/05/18  Yes Susy Frizzle, MD  fluticasone (FLONASE) 50 MCG/ACT nasal spray USE 2 SPRAYS IN EACH NOSTRIL DAILY FOR 3 MONTHS Patient taking differently: Place 2 sprays into both nostrils daily as needed (allergies.).  12/10/17  Yes Susy Frizzle, MD  Ginkgo Biloba 120 MG TABS Take 120 mg by mouth daily.   Yes [provider]  Glucosamine-Chondroitin (COSAMIN DS PO) Take 1 tablet by mouth 2 (two) times a day.   Yes [provider]  losartan (COZAAR) 100 MG tablet TAKE 1 TABLET BY MOUTH EVERY DAY Patient taking differently: Take 100 mg by mouth daily.  02/09/19  Yes Larey Dresser, MD  Multiple Vitamin (MULTIVITAMIN WITH MINERALS) TABS tablet Take 1 tablet by mouth daily. Senior Multivitamin Iron Free Formula   Yes [provider]  spironolactone (ALDACTONE) 25 MG tablet Take 1 tablet (25 mg total) by mouth daily. 01/18/19  Yes Larey Dresser, MD  vitamin B-12 (CYANOCOBALAMIN) 1000 MCG tablet Take 1,000 mcg by mouth daily.   Yes [provider]  zafirlukast (ACCOLATE) 20 MG tablet TAKE 1 TABLET (20 MG TOTAL) BY MOUTH 2 (TWO) TIMES DAILY. Patient taking differently: Take 20 mg by mouth 2 (two) times daily before a meal.  04/25/18  Yes Susy Frizzle, MD  Blood Glucose Monitoring Suppl (ACCU-CHEK AVIVA PLUS) w/Device KIT Use to check FBS QAM - DX:E11.9 09/21/18   Susy Frizzle, MD  Blood Glucose Monitoring Suppl (ONE TOUCH ULTRA 2) W/DEVICE KIT Check FBS qam - Dx:250.00 and she will need lancets (1 box) , strips (1 bottle = 100) and controls also thanks 09/01/13    Susy Frizzle, MD  gabapentin (NEURONTIN) 300 MG capsule Take 1 capsule (300 mg total) by mouth 3 (three) times daily. Take a 300 mg capsule three times a day for two weeks following surgery.Then take a 300 mg capsule two times a day for two weeks. Then take a 300 mg capsule once a day for two weeks. Then discontinue. Patient not taking: Reported on 11/21/2018 11/02/18   Theresa Duty L, PA  glucose blood (ACCU-CHEK AVIVA) test strip Check FBS QAM - DX:E11.9 09/21/18   Susy Frizzle, MD  glucose blood test strip Use to check BS QAM DX: E11.9 09/07/18   Susy Frizzle, MD  HYDROmorphone (DILAUDID) 2 MG tablet Take 1-2  tablets (2-4 mg total) by mouth every 6 (six) hours as needed for severe pain. Patient not taking: Reported on 11/21/2018 11/02/18   Edmisten, Ok Anis, PA  Lancets (ACCU-CHEK SOFT TOUCH) lancets Use to check BS QAM DX: E11.9 09/21/18   Susy Frizzle, MD  methocarbamol (ROBAXIN) 500 MG tablet Take 1 tablet (500 mg total) by mouth every 6 (six) hours as needed for muscle spasms. Patient not taking: Reported on 11/21/2018 11/02/18   Theresa Duty L, PA  ondansetron (ZOFRAN ODT) 4 MG disintegrating tablet 78m ODT q4 hours prn nausea/vomit Patient not taking: Reported on 12/19/2018 11/21/18   ZMilton Ferguson MD  traMADol (ULTRAM) 50 MG tablet Take 1-2 tablets (50-100 mg total) by mouth every 6 (six) hours as needed for moderate pain. Patient not taking: Reported on 12/19/2018 11/02/18   Edmisten, KDrue DunL, PA   KNEE EXAM antalgic gait, No warmth or effusion; range 5-90 degrees with firm endpoint on range of motion  Physical Examination: General appearance - alert, well appearing, and in no distress Mental status - alert, oriented to person, place, and time Chest - clear to auscultation, no wheezes, rales or rhonchi, symmetric air entry Heart - normal rate, regular rhythm, normal S1, S2, no murmurs, rubs, clicks or gallops Abdomen - soft, nontender, nondistended, no masses  or organomegaly Neurological - alert, oriented, normal speech, no focal findings or movement disorder noted   Asessment/Plan--- Left knee arthrofibrosis- - Plan left knee closed manipulation Procedure risks and potential comps discussed with patient who elects to proceed. Goals are decreased pain and increased function with a high likelihood of achieving both

## 2019-02-22 NOTE — Discharge Instructions (Signed)
Resume your physical therapy tomorrow  Activities as tolerated  I was able to flex your knee 125 degrees    General Anesthesia, Adult, Care After This sheet gives you information about how to care for yourself after your procedure. Your health care provider may also give you more specific instructions. If you have problems or questions, contact your health care provider. What can I expect after the procedure? After the procedure, the following side effects are common:  Pain or discomfort at the IV site.  Nausea.  Vomiting.  Sore throat.  Trouble concentrating.  Feeling cold or chills.  Weak or tired.  Sleepiness and fatigue.  Soreness and body aches. These side effects can affect parts of the body that were not involved in surgery. Follow these instructions at home:  For at least 24 hours after the procedure:  Have a responsible adult stay with you. It is important to have someone help care for you until you are awake and alert.  Rest as needed.  Do not: ? Participate in activities in which you could fall or become injured. ? Drive. ? Use heavy machinery. ? Drink alcohol. ? Take sleeping pills or medicines that cause drowsiness. ? Make important decisions or sign legal documents. ? Take care of children on your own. Eating and drinking  Follow any instructions from your health care provider about eating or drinking restrictions.  When you feel hungry, start by eating small amounts of foods that are soft and easy to digest (bland), such as toast. Gradually return to your regular diet.  Drink enough fluid to keep your urine pale yellow.  If you vomit, rehydrate by drinking water, juice, or clear broth. General instructions  If you have sleep apnea, surgery and certain medicines can increase your risk for breathing problems. Follow instructions from your health care provider about wearing your sleep device: ? Anytime you are sleeping, including during daytime  naps. ? While taking prescription pain medicines, sleeping medicines, or medicines that make you drowsy.  Return to your normal activities as told by your health care provider. Ask your health care provider what activities are safe for you.  Take over-the-counter and prescription medicines only as told by your health care provider.  If you smoke, do not smoke without supervision.  Keep all follow-up visits as told by your health care provider. This is important. Contact a health care provider if:  You have nausea or vomiting that does not get better with medicine.  You cannot eat or drink without vomiting.  You have pain that does not get better with medicine.  You are unable to pass urine.  You develop a skin rash.  You have a fever.  You have redness around your IV site that gets worse. Get help right away if:  You have difficulty breathing.  You have chest pain.  You have blood in your urine or stool, or you vomit blood. Summary  After the procedure, it is common to have a sore throat or nausea. It is also common to feel tired.  Have a responsible adult stay with you for the first 24 hours after general anesthesia. It is important to have someone help care for you until you are awake and alert.  When you feel hungry, start by eating small amounts of foods that are soft and easy to digest (bland), such as toast. Gradually return to your regular diet.  Drink enough fluid to keep your urine pale yellow.  Return to your normal activities  as told by your health care provider. Ask your health care provider what activities are safe for you. This information is not intended to replace advice given to you by your health care provider. Make sure you discuss any questions you have with your health care provider. Document Released: 12/28/2000 Document Revised: 05/07/2017 Document Reviewed: 05/07/2017 Elsevier Interactive Patient Education  2019 [employer].

## 2019-02-23 ENCOUNTER — Encounter (HOSPITAL_COMMUNITY): Payer: Self-pay | Admitting: Orthopedic Surgery

## 2019-02-23 DIAGNOSIS — M25562 Pain in left knee: Secondary | ICD-10-CM | POA: Diagnosis not present

## 2019-02-28 DIAGNOSIS — M25562 Pain in left knee: Secondary | ICD-10-CM | POA: Diagnosis not present

## 2019-03-02 DIAGNOSIS — N8111 Cystocele, midline: Secondary | ICD-10-CM | POA: Diagnosis not present

## 2019-03-02 DIAGNOSIS — M25562 Pain in left knee: Secondary | ICD-10-CM | POA: Diagnosis not present

## 2019-03-02 DIAGNOSIS — Z124 Encounter for screening for malignant neoplasm of cervix: Secondary | ICD-10-CM | POA: Diagnosis not present

## 2019-03-02 DIAGNOSIS — B373 Candidiasis of vulva and vagina: Secondary | ICD-10-CM | POA: Diagnosis not present

## 2019-03-02 DIAGNOSIS — Z01419 Encounter for gynecological examination (general) (routine) without abnormal findings: Secondary | ICD-10-CM | POA: Diagnosis not present

## 2019-03-02 DIAGNOSIS — N898 Other specified noninflammatory disorders of vagina: Secondary | ICD-10-CM | POA: Diagnosis not present

## 2019-03-03 DIAGNOSIS — M25562 Pain in left knee: Secondary | ICD-10-CM | POA: Diagnosis not present

## 2019-03-06 DIAGNOSIS — M25562 Pain in left knee: Secondary | ICD-10-CM | POA: Diagnosis not present

## 2019-03-08 DIAGNOSIS — M25562 Pain in left knee: Secondary | ICD-10-CM | POA: Diagnosis not present

## 2019-03-10 DIAGNOSIS — M25562 Pain in left knee: Secondary | ICD-10-CM | POA: Diagnosis not present

## 2019-03-11 ENCOUNTER — Other Ambulatory Visit: Payer: Self-pay | Admitting: Family Medicine

## 2019-03-13 ENCOUNTER — Other Ambulatory Visit: Payer: Self-pay | Admitting: Family Medicine

## 2019-03-13 DIAGNOSIS — M25562 Pain in left knee: Secondary | ICD-10-CM | POA: Diagnosis not present

## 2019-03-15 DIAGNOSIS — M25562 Pain in left knee: Secondary | ICD-10-CM | POA: Diagnosis not present

## 2019-03-16 ENCOUNTER — Ambulatory Visit (INDEPENDENT_AMBULATORY_CARE_PROVIDER_SITE_OTHER): Payer: Medicare Other | Admitting: Family Medicine

## 2019-03-16 ENCOUNTER — Encounter: Payer: Self-pay | Admitting: Family Medicine

## 2019-03-16 ENCOUNTER — Other Ambulatory Visit: Payer: Self-pay

## 2019-03-16 VITALS — BP 120/64 | HR 77 | Temp 98.4°F | Resp 16 | Ht 62.0 in | Wt 172.0 lb

## 2019-03-16 DIAGNOSIS — L292 Pruritus vulvae: Secondary | ICD-10-CM | POA: Diagnosis not present

## 2019-03-16 DIAGNOSIS — E118 Type 2 diabetes mellitus with unspecified complications: Secondary | ICD-10-CM | POA: Diagnosis not present

## 2019-03-16 DIAGNOSIS — D539 Nutritional anemia, unspecified: Secondary | ICD-10-CM | POA: Diagnosis not present

## 2019-03-16 DIAGNOSIS — D649 Anemia, unspecified: Secondary | ICD-10-CM | POA: Diagnosis not present

## 2019-03-16 DIAGNOSIS — C50911 Malignant neoplasm of unspecified site of right female breast: Secondary | ICD-10-CM | POA: Diagnosis not present

## 2019-03-16 MED ORDER — FLUTICASONE PROPIONATE 50 MCG/ACT NA SUSP: NASAL | 3 refills | Status: DC

## 2019-03-16 MED ORDER — GLUCOSE BLOOD VI STRP: ORAL_STRIP | 3 refills | Status: DC

## 2019-03-16 NOTE — Progress Notes (Signed)
Subjective:    Patient ID: [name], female    DOB: 18-Feb-1946, 73 y.o.   MRN: [id number]  HPI  Patient has a longstanding history of diabetes mellitus.  Over the last several months she has developed numbness in both feet primarily the big toe and the second toe.  She is also developed sharp pain in her plantar aspects of her feet.  Is primarily worse in the evenings after she sits for long period of time and stands up to walk.  The pain is located in the plantar aspects of both feet and seems to be worsening along with the numbness. Past Medical History:  Diagnosis Date  . Allergy history unknown   . Arthritis    oa  . Asthma    takes acolate for  . Breast cancer ([place]) 1999; 2008   hx of bilateral breast cancer  . Breast cancer, left breast ([place]) 10/27/2012   3.2 cm ER positive, Her-2 negative, 1 node positive S/P mastectomy/TTRAM reconstruction 4/08  . Breast cancer, right breast ([place]) 10/27/2012   3.2 cm ER/PR positive, 1 node positive, Her-2 negative Rx w mastectomy, AC -T chemo, followed by aromatase inhibitor x 5 years dx 4/08  . Cancer [facility])    breast CA  . Cataracts, bilateral   . Dehydration after exertion    after she went berry picking in the summer heat . see ED visit   . Diabetes ([place])   . DM2 (diabetes mellitus, type 2) ([place])    on welchol for  . Dyspnea    with exertion  . Elevated cholesterol   . GERD (gastroesophageal reflux disease)   . Hiatal hernia   . Hypertension   . LBBB (left bundle branch block) 10/27/2012  . Leg cramps   . NICM (nonischemic cardiomyopathy) ([place])    a. Echo:  06/14/12: Poor endocardial definition, possible septal HK, EF 50-55%, normal wall motion, mild LAE ;  b.  [facility] 9/13:  normal cors, EF 35-40%,  c. Echo 7/14: EF 50-55%, mild LAE  . Other fatigue 01/01/2015  . PONV (postoperative nausea and vomiting)   . Pulmonary embolus ([place]) 2001   after tram flap surgery   Past Surgical History:  Procedure Laterality Date  . DILATION AND  CURETTAGE OF UTERUS   56yr ago   x 2  . KNEE CLOSED REDUCTION Right 06/01/2018   Procedure: CLOSED MANIPULATION RIGHT KNEE;  Surgeon: AGaynelle Arabian MD;  [facility];  Service: Orthopedics;  Laterality: Right;  . KNEE CLOSED REDUCTION Left 02/22/2019   Procedure: CLOSED MANIPULATION KNEE;  Surgeon: AGaynelle Arabian MD;  [facility];  Service: Orthopedics;  Laterality: Left;  132m  . KNEE SURGERY  2006   left torn cartilage repair  . LEFT HEART CATHETERIZATION WITH CORONARY ANGIOGRAM N/A 06/15/2012   Procedure: LEFT HEART CATHETERIZATION WITH CORONARY ANGIOGRAM;  Surgeon: MuWellington HampshireMD;  [facility];  Service: Cardiovascular;  Laterality: N/A;  . MASTECTOMY     bilateral  . RECONSTRUCTION BREAST W/ TRAM FLAP Left 2001  . TOTAL KNEE ARTHROPLASTY Right 03/07/2018   Procedure: RIGHT TOTAL KNEE ARTHROPLASTY;  Surgeon: AlGaynelle ArabianMD;  [facility];  Service: Orthopedics;  Laterality: Right;  . TOTAL KNEE ARTHROPLASTY Left 10/31/2018   Procedure: TOTAL KNEE ARTHROPLASTY;  Surgeon: AlGaynelle ArabianMD;  [facility];  Service: Orthopedics;  Laterality: Left;  5052m  Current Outpatient Medications on File Prior to Visit  Medication Sig Dispense Refill  .  acetaminophen (TYLENOL) 500 MG tablet Take 1,000 mg by mouth every 6 (six) hours as needed for mild pain or moderate pain.     Marland Kitchen apixaban (ELIQUIS) 5 MG TABS tablet Take 1 tablet (5 mg total) by mouth 2 (two) times daily. 180 tablet 0  . Ascorbic Acid (VITAMIN C WITH ROSE HIPS) 500 MG tablet Take 500 mg by mouth daily.    Marland Kitchen atorvastatin (LIPITOR) 20 MG tablet TAKE 2 TABLETS (40 MG TOTAL) BY MOUTH EVERY EVENING. 180 tablet 2  . Blood Glucose Monitoring Suppl (ACCU-CHEK AVIVA PLUS) w/Device KIT Use to check FBS QAM - DX:E11.9 1 kit 1  . Blood Glucose Monitoring Suppl (ONE TOUCH ULTRA 2) W/DEVICE KIT Check FBS qam - Dx:250.00 and she will need lancets (1 box) , strips (1 bottle = 100) and controls also thanks 1 each 0   . carvedilol (COREG) 12.5 MG tablet TAKE 1 TABLET IN THE MORNING AND 1.5 TABLETS IN THE EVENING (Patient taking differently: Take 12.5-18.75 mg by mouth See admin instructions. Take 1 tablet (12.5 mg) by mouth in the morning & take 1.5 tablet (18.75 mg) by mouth in the evening.) 210 tablet 0  . clobetasol cream (TEMOVATE) 4.09 % Apply 1 application topically daily as needed (for itchy/discomfort).    . colesevelam (WELCHOL) 625 MG tablet TAKE 3 TABLETS (1,875 MG TOTAL) BY MOUTH 2 (TWO) TIMES DAILY WITH A MEAL. 540 tablet 3  . DEXILANT 60 MG capsule TAKE ONE CAPSULE BY MOUTH EVERY DAY (Patient taking differently: Take 60 mg by mouth daily. ) 90 capsule 3  . Ginkgo Biloba 120 MG TABS Take 120 mg by mouth daily.    . Glucosamine-Chondroitin (COSAMIN DS PO) Take 1 tablet by mouth 2 (two) times a day.    . Lancets (ACCU-CHEK SOFT TOUCH) lancets Use to check BS QAM DX: E11.9 100 each 3  . losartan (COZAAR) 100 MG tablet TAKE 1 TABLET BY MOUTH EVERY DAY (Patient taking differently: Take 100 mg by mouth daily. ) 90 tablet 1  . Multiple Vitamin (MULTIVITAMIN WITH MINERALS) TABS tablet Take 1 tablet by mouth daily. Senior Multivitamin Iron Free Formula    . spironolactone (ALDACTONE) 25 MG tablet Take 1 tablet (25 mg total) by mouth daily. 90 tablet 0  . vitamin B-12 (CYANOCOBALAMIN) 1000 MCG tablet Take 1,000 mcg by mouth daily.    . zafirlukast (ACCOLATE) 20 MG tablet TAKE 1 TABLET (20 MG TOTAL) BY MOUTH 2 (TWO) TIMES DAILY. (Patient taking differently: Take 20 mg by mouth 2 (two) times daily before a meal. ) 180 tablet 4   No current facility-administered medications on file prior to visit.    Allergies  Allergen Reactions  . Adhesive [Tape] Other (See Comments)    Skin turns red   . Diphenhydramine Other (See Comments)    Pt feels wired, hyperactive  . Vicodin [Hydrocodone-Acetaminophen] Nausea And Vomiting   Social History   Socioeconomic History  . Marital status: Married    Spouse name:  Not on file  . Number of children: 2  . Years of education: Not on file  . Highest education level: Not on file  Occupational History  . 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 Financial resource strain: Not on file  . Food insecurity    Worry: Not on file    Inability: Not on file  . Transportation needs    Medical: Not on file  Non-medical: Not on file  Tobacco Use  . Smoking status: Never Smoker  . Smokeless tobacco: Never Used  Substance and Sexual Activity  . Alcohol use: No  . Drug use: No  . Sexual activity: Never  Lifestyle  . Physical activity    Days per week: Not on file    Minutes per session: Not on file  . Stress: Not on file  Relationships  . Social Herbalist on phone: Not on file    Gets together: Not on file    Attends religious service: Not on file    Active member of club or organization: Not on file    Attends meetings of clubs or organizations: Not on file    Relationship status: Not on file  . Intimate partner violence    Fear of current or ex partner: Not on file    Emotionally abused: Not on file    Physically abused: Not on file    Forced sexual activity: Not on file  Other Topics Concern  . Not on file  Social History Narrative  . Not on file      Review of Systems  All other systems reviewed and are negative.      Objective:   Physical Exam Vitals signs reviewed.  Constitutional:      General: She is not in acute distress.    Appearance: She is well-developed. She is not diaphoretic.  Cardiovascular:     Rate and Rhythm: Normal rate and regular rhythm.     Heart sounds: Normal heart sounds.  Pulmonary:     Effort: Pulmonary effort is normal. No respiratory distress.     Breath sounds: Normal breath sounds. No stridor. No wheezing or rales.           Assessment & Plan:  1. Controlled type 2 diabetes mellitus with complication, without long-term  current use of insulin (HCC) Check the control of her blood sugar by checking a hemoglobin A1c.  Her B12 was checked last year and was normal at over thousand.  TSH has been normal when last checked in 2017.  I suspect that she is developing diabetic neuropathy.  I recommended that she try gabapentin 300 mg every 8 hours as needed nerve pain - CBC with Differential/Platelet - COMPLETE METABOLIC PANEL WITH GFR - Hemoglobin A1c

## 2019-03-17 DIAGNOSIS — M25562 Pain in left knee: Secondary | ICD-10-CM | POA: Diagnosis not present

## 2019-03-18 LAB — CBC WITH DIFFERENTIAL/PLATELET
Absolute Monocytes: 523 cells/uL (ref 200–950)
Basophils Absolute: 38 cells/uL (ref 0–200)
Basophils Relative: 0.8 %
Eosinophils Absolute: 130 cells/uL (ref 15–500)
Eosinophils Relative: 2.7 %
HCT: 31.2 % — ABNORMAL LOW (ref 35.0–45.0)
Hemoglobin: 10.4 g/dL — ABNORMAL LOW (ref 11.7–15.5)
Lymphs Abs: 1613 cells/uL (ref 850–3900)
MCH: 30.7 pg (ref 27.0–33.0)
MCHC: 33.3 g/dL (ref 32.0–36.0)
MCV: 92 fL (ref 80.0–100.0)
MPV: 9.9 fL (ref 7.5–12.5)
Monocytes Relative: 10.9 %
Neutro Abs: 2496 cells/uL (ref 1500–7800)
Neutrophils Relative %: 52 %
Platelets: 285 10*3/uL (ref 140–400)
RBC: 3.39 10*6/uL — ABNORMAL LOW (ref 3.80–5.10)
RDW: 13 % (ref 11.0–15.0)
Total Lymphocyte: 33.6 %
WBC: 4.8 10*3/uL (ref 3.8–10.8)

## 2019-03-18 LAB — IRON: Iron: 68 ug/dL (ref 45–160)

## 2019-03-18 LAB — COMPLETE METABOLIC PANEL WITH GFR
AG Ratio: 1.4 (calc) (ref 1.0–2.5)
ALT: 11 U/L (ref 6–29)
AST: 13 U/L (ref 10–35)
Albumin: 3.9 g/dL (ref 3.6–5.1)
Alkaline phosphatase (APISO): 88 U/L (ref 37–153)
BUN/Creatinine Ratio: 32 (calc) — ABNORMAL HIGH (ref 6–22)
BUN: 30 mg/dL — ABNORMAL HIGH (ref 7–25)
CO2: 20 mmol/L (ref 20–32)
Calcium: 9.3 mg/dL (ref 8.6–10.4)
Chloride: 109 mmol/L (ref 98–110)
Creat: 0.94 mg/dL — ABNORMAL HIGH (ref 0.60–0.93)
GFR, Est African American: 69 mL/min/{1.73_m2} (ref >=60)
GFR, Est Non African American: 60 mL/min/{1.73_m2} (ref >=60)
Globulin: 2.8 g/dL (calc) (ref 1.9–3.7)
Glucose, Bld: 102 mg/dL — ABNORMAL HIGH (ref 65–99)
Potassium: 4.8 mmol/L (ref 3.5–5.3)
Sodium: 139 mmol/L (ref 135–146)
Total Bilirubin: 0.3 mg/dL (ref 0.2–1.2)
Total Protein: 6.7 g/dL (ref 6.1–8.1)

## 2019-03-18 LAB — HEMOGLOBIN A1C
Hgb A1c MFr Bld: 6.5 % of total Hgb — ABNORMAL HIGH (ref <5.7)
Mean Plasma Glucose: 140 (calc)
eAG (mmol/L): 7.7 (calc)

## 2019-03-18 LAB — TEST AUTHORIZATION

## 2019-03-18 LAB — VITAMIN B12: Vitamin B-12: 1020 pg/mL (ref 200–1100)

## 2019-03-20 DIAGNOSIS — M25562 Pain in left knee: Secondary | ICD-10-CM | POA: Diagnosis not present

## 2019-03-23 DIAGNOSIS — M25562 Pain in left knee: Secondary | ICD-10-CM | POA: Diagnosis not present

## 2019-03-27 DIAGNOSIS — M25562 Pain in left knee: Secondary | ICD-10-CM | POA: Diagnosis not present

## 2019-03-29 DIAGNOSIS — M25562 Pain in left knee: Secondary | ICD-10-CM | POA: Diagnosis not present

## 2019-03-31 DIAGNOSIS — M25562 Pain in left knee: Secondary | ICD-10-CM | POA: Diagnosis not present

## 2019-04-04 DIAGNOSIS — M25662 Stiffness of left knee, not elsewhere classified: Secondary | ICD-10-CM | POA: Diagnosis not present

## 2019-04-06 ENCOUNTER — Other Ambulatory Visit: Payer: Self-pay | Admitting: Family Medicine

## 2019-04-06 DIAGNOSIS — M25662 Stiffness of left knee, not elsewhere classified: Secondary | ICD-10-CM | POA: Diagnosis not present

## 2019-04-10 DIAGNOSIS — M25662 Stiffness of left knee, not elsewhere classified: Secondary | ICD-10-CM | POA: Diagnosis not present

## 2019-04-10 DIAGNOSIS — M25562 Pain in left knee: Secondary | ICD-10-CM | POA: Diagnosis not present

## 2019-04-12 DIAGNOSIS — M25562 Pain in left knee: Secondary | ICD-10-CM | POA: Diagnosis not present

## 2019-04-14 DIAGNOSIS — M25662 Stiffness of left knee, not elsewhere classified: Secondary | ICD-10-CM | POA: Diagnosis not present

## 2019-04-16 ENCOUNTER — Other Ambulatory Visit (HOSPITAL_COMMUNITY): Payer: Self-pay | Admitting: Cardiology

## 2019-04-17 ENCOUNTER — Other Ambulatory Visit: Payer: Self-pay | Admitting: Internal Medicine

## 2019-04-17 DIAGNOSIS — M25562 Pain in left knee: Secondary | ICD-10-CM | POA: Diagnosis not present

## 2019-04-17 DIAGNOSIS — M25662 Stiffness of left knee, not elsewhere classified: Secondary | ICD-10-CM | POA: Diagnosis not present

## 2019-04-19 DIAGNOSIS — M25562 Pain in left knee: Secondary | ICD-10-CM | POA: Diagnosis not present

## 2019-04-21 DIAGNOSIS — M25562 Pain in left knee: Secondary | ICD-10-CM | POA: Diagnosis not present

## 2019-04-24 DIAGNOSIS — M25662 Stiffness of left knee, not elsewhere classified: Secondary | ICD-10-CM | POA: Diagnosis not present

## 2019-04-24 DIAGNOSIS — M25562 Pain in left knee: Secondary | ICD-10-CM | POA: Diagnosis not present

## 2019-04-28 DIAGNOSIS — M25562 Pain in left knee: Secondary | ICD-10-CM | POA: Diagnosis not present

## 2019-05-01 DIAGNOSIS — M25562 Pain in left knee: Secondary | ICD-10-CM | POA: Diagnosis not present

## 2019-05-03 ENCOUNTER — Other Ambulatory Visit: Payer: Self-pay

## 2019-05-03 DIAGNOSIS — M25562 Pain in left knee: Secondary | ICD-10-CM | POA: Diagnosis not present

## 2019-05-05 DIAGNOSIS — M25562 Pain in left knee: Secondary | ICD-10-CM | POA: Diagnosis not present

## 2019-05-10 DIAGNOSIS — M25562 Pain in left knee: Secondary | ICD-10-CM | POA: Diagnosis not present

## 2019-05-10 DIAGNOSIS — M25662 Stiffness of left knee, not elsewhere classified: Secondary | ICD-10-CM | POA: Diagnosis not present

## 2019-05-12 DIAGNOSIS — M25662 Stiffness of left knee, not elsewhere classified: Secondary | ICD-10-CM | POA: Diagnosis not present

## 2019-05-16 DIAGNOSIS — M25662 Stiffness of left knee, not elsewhere classified: Secondary | ICD-10-CM | POA: Diagnosis not present

## 2019-05-19 DIAGNOSIS — M25662 Stiffness of left knee, not elsewhere classified: Secondary | ICD-10-CM | POA: Diagnosis not present

## 2019-05-23 DIAGNOSIS — M25562 Pain in left knee: Secondary | ICD-10-CM | POA: Diagnosis not present

## 2019-05-26 DIAGNOSIS — M25562 Pain in left knee: Secondary | ICD-10-CM | POA: Diagnosis not present

## 2019-05-29 DIAGNOSIS — M25562 Pain in left knee: Secondary | ICD-10-CM | POA: Diagnosis not present

## 2019-05-31 DIAGNOSIS — M25562 Pain in left knee: Secondary | ICD-10-CM | POA: Diagnosis not present

## 2019-06-08 DIAGNOSIS — M25562 Pain in left knee: Secondary | ICD-10-CM | POA: Diagnosis not present

## 2019-06-08 DIAGNOSIS — Z96652 Presence of left artificial knee joint: Secondary | ICD-10-CM | POA: Diagnosis not present

## 2019-06-09 DIAGNOSIS — Z23 Encounter for immunization: Secondary | ICD-10-CM | POA: Diagnosis not present

## 2019-07-13 ENCOUNTER — Other Ambulatory Visit (HOSPITAL_COMMUNITY): Payer: Self-pay | Admitting: Cardiology

## 2019-07-16 ENCOUNTER — Other Ambulatory Visit: Payer: Self-pay | Admitting: Family Medicine

## 2019-08-07 ENCOUNTER — Other Ambulatory Visit (HOSPITAL_COMMUNITY): Payer: Self-pay

## 2019-08-07 MED ORDER — CARVEDILOL 12.5 MG PO TABS: ORAL_TABLET | ORAL | 0 refills | Status: DC

## 2019-08-10 ENCOUNTER — Other Ambulatory Visit (HOSPITAL_COMMUNITY): Payer: Self-pay | Admitting: Cardiology

## 2019-08-19 ENCOUNTER — Other Ambulatory Visit: Payer: Self-pay | Admitting: Family Medicine

## 2019-08-19 ENCOUNTER — Other Ambulatory Visit (HOSPITAL_COMMUNITY): Payer: Self-pay | Admitting: Internal Medicine

## 2019-08-19 ENCOUNTER — Other Ambulatory Visit (HOSPITAL_COMMUNITY): Payer: Self-pay | Admitting: Cardiology

## 2019-08-21 ENCOUNTER — Other Ambulatory Visit (HOSPITAL_COMMUNITY): Payer: Self-pay

## 2019-08-21 MED ORDER — CARVEDILOL 12.5 MG PO TABS: ORAL_TABLET | ORAL | 0 refills | Status: DC

## 2019-10-11 ENCOUNTER — Telehealth (HOSPITAL_COMMUNITY): Payer: Self-pay

## 2019-10-11 NOTE — Telephone Encounter (Signed)
Received a call from patient stating that she has not been feeling well and that her home ekg machine told her that she was in possible afib and that her heart rate was in the 130s last night and today. Patient stated she was trying to avoid the ED and just wanted [place] to change her medication. I advised her that the ED was the best option at this time because she hasn't been seen since 2019 and given her medical history we want to get this handled asap. Patient agreed to go to the ED.

## 2019-10-11 NOTE — Telephone Encounter (Signed)
error 

## 2019-10-16 ENCOUNTER — Telehealth (HOSPITAL_COMMUNITY): Payer: Self-pay

## 2019-10-16 NOTE — Telephone Encounter (Signed)

## 2019-10-17 ENCOUNTER — Encounter (HOSPITAL_COMMUNITY): Payer: Self-pay | Admitting: Cardiology

## 2019-10-17 ENCOUNTER — Other Ambulatory Visit: Payer: Self-pay

## 2019-10-17 ENCOUNTER — Ambulatory Visit (HOSPITAL_COMMUNITY)
Admission: RE | Admit: 2019-10-17 | Discharge: 2019-10-17 | Disposition: A | Discharge status: Home / Self Care | Payer: Medicare Other | Source: Ambulatory Visit | Attending: Cardiology | Admitting: Cardiology

## 2019-10-17 VITALS — BP 130/72 | HR 85 | Wt 180.0 lb

## 2019-10-17 DIAGNOSIS — Z7901 Long term (current) use of anticoagulants: Secondary | ICD-10-CM | POA: Insufficient documentation

## 2019-10-17 DIAGNOSIS — I480 Paroxysmal atrial fibrillation: Secondary | ICD-10-CM | POA: Diagnosis not present

## 2019-10-17 DIAGNOSIS — E119 Type 2 diabetes mellitus without complications: Secondary | ICD-10-CM | POA: Insufficient documentation

## 2019-10-17 DIAGNOSIS — E785 Hyperlipidemia, unspecified: Secondary | ICD-10-CM | POA: Diagnosis not present

## 2019-10-17 DIAGNOSIS — I509 Heart failure, unspecified: Secondary | ICD-10-CM | POA: Diagnosis not present

## 2019-10-17 DIAGNOSIS — I110 Hypertensive heart disease with heart failure: Secondary | ICD-10-CM | POA: Insufficient documentation

## 2019-10-17 DIAGNOSIS — J45909 Unspecified asthma, uncomplicated: Secondary | ICD-10-CM | POA: Diagnosis not present

## 2019-10-17 DIAGNOSIS — Z86711 Personal history of pulmonary embolism: Secondary | ICD-10-CM | POA: Insufficient documentation

## 2019-10-17 DIAGNOSIS — Z79899 Other long term (current) drug therapy: Secondary | ICD-10-CM | POA: Diagnosis not present

## 2019-10-17 DIAGNOSIS — E7849 Other hyperlipidemia: Secondary | ICD-10-CM

## 2019-10-17 DIAGNOSIS — I428 Other cardiomyopathies: Secondary | ICD-10-CM | POA: Insufficient documentation

## 2019-10-17 DIAGNOSIS — I447 Left bundle-branch block, unspecified: Secondary | ICD-10-CM

## 2019-10-17 LAB — BASIC METABOLIC PANEL
Anion gap: 9 (ref 5–15)
BUN: 29 mg/dL — ABNORMAL HIGH (ref 8–23)
CO2: 20 mmol/L — ABNORMAL LOW (ref 22–32)
Calcium: 9.1 mg/dL (ref 8.9–10.3)
Chloride: 109 mmol/L (ref 98–111)
Creatinine, Ser: 1.15 mg/dL — ABNORMAL HIGH (ref 0.44–1.00)
GFR calc Af Amer: 54 mL/min — ABNORMAL LOW (ref >60)
GFR calc non Af Amer: 47 mL/min — ABNORMAL LOW (ref >60)
Glucose, Bld: 116 mg/dL — ABNORMAL HIGH (ref 70–99)
Potassium: 4.5 mmol/L (ref 3.5–5.1)
Sodium: 138 mmol/L (ref 135–145)

## 2019-10-17 LAB — LIPID PANEL
Cholesterol: 106 mg/dL (ref 0–200)
HDL: 35 mg/dL — ABNORMAL LOW (ref >40)
LDL Cholesterol: 47 mg/dL (ref 0–99)
Total CHOL/HDL Ratio: 3 RATIO
Triglycerides: 121 mg/dL (ref <150)
VLDL: 24 mg/dL (ref 0–40)

## 2019-10-17 LAB — CBC
HCT: 35.9 % — ABNORMAL LOW (ref 36.0–46.0)
Hemoglobin: 11.2 g/dL — ABNORMAL LOW (ref 12.0–15.0)
MCH: 30.8 pg (ref 26.0–34.0)
MCHC: 31.2 g/dL (ref 30.0–36.0)
MCV: 98.6 fL (ref 80.0–100.0)
Platelets: 277 10*3/uL (ref 150–400)
RBC: 3.64 MIL/uL — ABNORMAL LOW (ref 3.87–5.11)
RDW: 12.4 % (ref 11.5–15.5)
WBC: 7.8 10*3/uL (ref 4.0–10.5)
nRBC: 0 % (ref 0.0–0.2)

## 2019-10-17 NOTE — Progress Notes (Signed)
Patient ID: [name], female   DOB: 11-01-1945, 74 y.o.   MRN: [id number]    Advanced Heart Failure Clinic Note   PCP: Dr. Dennard Schaumann HF: Dr. Aundra Dubin   74 y.o. with history of HTN and type II diabetes presents for followup of CHF.  Back in 2009, she had an echo with normal EF.  She was admitted in 9/13 with chest pain and LBBB.  She had LHC showing no CAD but EF 35-40%.  Echo was a technically difficult study with EF reported as 50-55%.  She has been taking ARB and Coreg.  Repeat echo in 12/13 showed EF 40% with septal hypokinesis.  Cardiac MRI was done in 12/13 as well, with calculated EF 50%, global hypokinesis, and no delayed enhancement.  Echo in 7/15 showed EF 55-60% with mild MR and mild TR (no LBBB at this time).   In 3/16, she began to develop increased dyspnea.  She was seen by Truitt Merle and was noted to have a LBBB again. Echo in 3/16 showed EF down to 35-40%.  She was then seen in followup by Rosaria Ferries.  Alivecor on her phone showed a period of HR up to 160s that appeared to be atrial fibrillation.  She felt tachypalpitations. Coreg was increased and she was started on apixaban 5 mg bid. CPX in 4/16 showed a submaximal effort but normal spirometry and normal peak VO2.  She did have ST changes possibly concerning for ischemia (but has baseline LBBB so hard to interpret).    Cardiolite in 5/16 showed on ischemia or infarction.  Most recent echo in 2/17 showed EF 45-50%, diffuse hypokinesis, grade I diastolic dysfunction, normal RV size and systolic function.  Echo 1/18 with EF stable at 45%.  Echo in 4/19 showed EF 50-55% with septal dyssynergy.    On 10/11/19, she noted fluttering/pounding in her chest and a discomfort in her upper chest/lower neck.  Her Kardia monitor showed atrial fibrillation with RVR.  This lasted overnight and resolved the next morning.  She has been in NSR since then.  No further palpitations. She gets short of breath walking up stairs or walking long distances.   No chest pain. No orthopnea/PND.    ECG (personally reviewed): NSR, LBBB  Labs (9/13): TSH 4.884 (elevated), proBNP 155 Labs (10/13): K 3.6, creatinine 0.8 Labs (1/14): K 3.7, creatinine 0.8, BNP not elevated, ANA weakly positive, TSH normal Labs (7/14): BNP normal Labs (11/14): K 4.1, creatinine 0.73 Labs (10/15): K 4.2, creatinine 0.73, LDL 45 Labs (3/16): BNP 46, HCT 42.2, TSH normal Labs (4/16): K 4.1, creatinine 0.78 => 0.8, HCT 39.3 Labs (1/17): K 4.6, creatinine 1.06, hgb 13.1, BNP 22 Labs (10/17): LDL 49, HDL 37, K 4.8, creatinine 0.92, TSH normal, BNP 62 Labs (4/18): LDL 48, K 4.4, creatinine 0.91 Labs (7/19): K 4.3, creatinine 1.02, 11.7  Labs (6/20): K 4.8, creatinine 0.94  PMH: 1. LBBB/IVCD: Now persistent.   2. Breast cancer s/p bilateral mastectomy. 3. GERD 4. Type II diabetes mellitus 5. HTN 6. Asthma 7. PE in 2001 8. Hyperlipidemia 9. Nonischemic Cardiomyopathy: Possible LBBB cardiomyopathy. Echo 8/09 with 60%.  Admitted in 9/13 with chest pain.  LHC showed no angiographic CAD and EF 35-40%.  Echo at that time was read as showing EF 50-55% but was a technically difficult study.  Echo (12/13): EF 40% with septal hypokinesis, normal RV size and systolic function, no significant valvular abnormalities.  Cardiac MRI (12/13): EF 50%, global hypokinesis, no delayed enhancement.  Echo (  7/14) with EF 50-55%, mild LV dilation, mild LAE.  Echo (7/15) with EF 55-60%, mild MR and mild TR.  Echo (3/16) with EF 35-40%, septal-lateral dyssynchrony.  CPX (4/16) with RER 0.98 (submaximal), peak VO2 19.5, VE/VCO2 slope 29.4 => normal spirometry, normal peak VO2, but ST changes suggest possibility of ischemia.  Lexiscan Cardiolite (5/16) with no ischemia/infarction. - Echo (2/17) with EF 45-50%, diffuse hypokinesis, grade I diastolic dysfunction, normal RV size and systolic function.  - Echo (1/18) with EF 45%, septal-lateral dyssynchrony, septal hypokinesis, normal RV size and systolic  function.  - Echo (4/19) with EF 50-55%, mild LVH, septal-lateral dyssynchony.  10. Supraclavicular lymphadenopathy: Stable by CT back to 2009, suspect benign.  11. Atrial fibrillation: Paroxysmal.  Holter (4/16) with rare PACs and PVCs, short run 5 beats SVT.  - Atrial fibrillation transiently in 1/21.  12. Right TKR 6/19, left TKR 1/20  SH: Married, lives in [facility], never smoked.  FH: Uncle with ? SCD.  Brother with ? SCD.    ROS: All systems reviewed and negative except as per HPI.   Current Outpatient Medications  Medication Sig Dispense Refill  . acetaminophen (TYLENOL) 500 MG tablet Take 1,000 mg by mouth every 6 (six) hours as needed for mild pain or moderate pain.     . Ascorbic Acid (VITAMIN C WITH ROSE HIPS) 500 MG tablet Take 500 mg by mouth daily.    Marland Kitchen atorvastatin (LIPITOR) 20 MG tablet TAKE 2 TABLETS (40 MG TOTAL) BY MOUTH EVERY EVENING. 180 tablet 2  . Blood Glucose Monitoring Suppl (ACCU-CHEK AVIVA PLUS) w/Device KIT Use to check FBS QAM - DX:E11.9 1 kit 1  . Blood Glucose Monitoring Suppl (ONE TOUCH ULTRA 2) W/DEVICE KIT Check FBS qam - Dx:250.00 and she will need lancets (1 box) , strips (1 bottle = 100) and controls also thanks 1 each 0  . carvedilol (COREG) 12.5 MG tablet TAKE 1 TABLET IN THE MORNING AND 1.5 TABLETS IN THE EVENING 270 tablet 0  . colesevelam (WELCHOL) 625 MG tablet TAKE 3 TABLETS BY MOUTH TWICE A DAY WITH A MEAL 540 tablet 3  . DEXILANT 60 MG capsule TAKE ONE CAPSULE BY MOUTH EVERY DAY 90 capsule 3  . ELIQUIS 5 MG TABS tablet TAKE 1 TABLET BY MOUTH TWICE A DAY 180 tablet 0  . fluticasone (FLONASE) 50 MCG/ACT nasal spray USE 2 SPRAYS IN EACH NOSTRIL DAILY FOR 3 MONTHS 48 g 3  . Ginkgo Biloba 120 MG TABS Take 120 mg by mouth daily.    . Glucosamine-Chondroitin (COSAMIN DS PO) Take 1 tablet by mouth 2 (two) times a day.    Marland Kitchen glucose blood test strip Use to check BS QAM DX: E11.9 100 each 3  . Lancets (ACCU-CHEK SOFT TOUCH) lancets Use to  check BS QAM DX: E11.9 100 each 3  . losartan (COZAAR) 100 MG tablet Take 1 tablet (100 mg total) by mouth daily. Needs appt for further refills 90 tablet 0  . Multiple Vitamin (MULTIVITAMIN WITH MINERALS) TABS tablet Take 1 tablet by mouth daily. Senior Multivitamin Iron Free Formula    . spironolactone (ALDACTONE) 25 MG tablet TAKE 1 TABLET BY MOUTH EVERY DAY 90 tablet 0  . vitamin B-12 (CYANOCOBALAMIN) 1000 MCG tablet Take 1,000 mcg by mouth daily.    . zafirlukast (ACCOLATE) 20 MG tablet TAKE 1 TABLET (20 MG TOTAL) BY MOUTH 2 (TWO) TIMES DAILY. 180 tablet 4  . clobetasol cream (TEMOVATE) 6.64 % Apply 1 application topically daily  as needed (for itchy/discomfort).     No current facility-administered medications for this encounter.    BP 130/72   Pulse 85   Wt 81.6 kg (180 lb)   SpO2 98%   BMI 32.92 kg/m    Wt Readings from Last 3 Encounters:  10/17/19 81.6 kg (180 lb)  03/16/19 78 kg (172 lb)  02/22/19 77.3 kg (170 lb 6 oz)   General: NAD Neck: No JVD, no thyromegaly or thyroid nodule.  Lungs: Clear to auscultation bilaterally with normal respiratory effort. CV: Nondisplaced PMI.  Heart regular S1/S2, no S3/S4, no murmur.  No peripheral edema.  No carotid bruit.  Normal pedal pulses.  Abdomen: Soft, nontender, no hepatosplenomegaly, no distention.  Skin: Intact without lesions or rashes.  Neurologic: Alert and oriented x 3.  Psych: Normal affect. Extremities: No clubbing or cyanosis.  HEENT: Normal.   Assessment/Plan: 1. Nonischemic cardiomyopathy:   No CAD on cath 9/13 but EF 35-40% at that time.  ECG showed LBBB.  LBBB resolved and EF normalized.  However, in 3/16, patient had fatigue and dyspnea => repeat echo with EF back down to 35-40%, she now has a persistent LBBB. This may be a LBBB cardiomyopathy.  CPX in 4/16 was submaximal but showed normal spirometry and actually near normal peak VO2.  Last echo in 4/19 showed EF stable at 50-55% with septal-lateral dyssynchrony.   NYHA class II symptoms. Volume status stable on exam. - She can stay off Lasix, use prn.  - Continue Coreg 12.5 qam/18.75 qpm (she cannot tolerate 18.75 bid due to dizziness/fatigue).   - Continue losartan 100 mg daily.  - Continue spironolactone 25 mg daily - I will arrange for repeat echo to reassess LV. 2. Hyperlipidemia:  Continue atorvastatin 40 mg qhs. - Check lipids today.  3. Atrial fibrillation: Paroxysmal.  NSR today.  She had an episode for a few hours earlier this month.   - Continue Eliquis 5 mg bid.  CBC today.  - If atrial fibrillation becomes more frequent, could consider an anti-arrhythmic (dronedarone or flecainide).  4. LBBB: Persistent and may be contributing to mild cardiomyopathy.   Followup in 6 months but will need BMET every 3 months.   Loralie Champagne 10/17/2019

## 2019-10-17 NOTE — Patient Instructions (Addendum)
No medication changes today!   Labs today and repeat in 3 months We will only contact you if something comes back abnormal or we need to make some changes. Otherwise no news is good news!  Please call office you think you are back in A-fib  Your physician has requested that you have an echocardiogram. Echocardiography is a painless test that uses sound waves to create images of your heart. It provides your doctor with information about the size and shape of your heart and how well your heart's chambers and valves are working. This procedure takes approximately one hour. There are no restrictions for this procedure.   Your physician recommends that you schedule a follow-up appointment in: 6 months with Dr Aundra Dubin and an ECHO. You will get a call to schedule this appointment in the spring.   Please call office at [phone] option [phone] if you have any questions or concerns.   At the [facility], you and your health needs are our priority. As part of our continuing mission to provide you with exceptional heart care, we have created designated Provider Care Teams. These Care Teams include your primary Cardiologist (physician) and Advanced Practice Providers (APPs- Physician Assistants and Nurse Practitioners) who all work together to provide you with the care you need, when you need it.   You may see any of the following providers on your designated Care Team at your next follow up: Marland Kitchen Dr Glori Bickers . Dr Loralie Champagne . Darrick Grinder, NP . Lyda Jester, PA . Audry Riles, PharmD   Please be sure to bring in all your medications bottles to every appointment.

## 2019-10-31 ENCOUNTER — Other Ambulatory Visit: Payer: Self-pay

## 2019-10-31 ENCOUNTER — Ambulatory Visit (HOSPITAL_COMMUNITY)
Admission: RE | Admit: 2019-10-31 | Discharge: 2019-10-31 | Disposition: A | Discharge status: Home / Self Care | Payer: Medicare Other | Source: Ambulatory Visit | Attending: Cardiology | Admitting: Cardiology

## 2019-10-31 DIAGNOSIS — I509 Heart failure, unspecified: Secondary | ICD-10-CM | POA: Diagnosis not present

## 2019-10-31 DIAGNOSIS — I428 Other cardiomyopathies: Secondary | ICD-10-CM | POA: Diagnosis not present

## 2019-10-31 NOTE — Progress Notes (Signed)
  Echocardiogram 2D Echocardiogram has been performed.  [name] 10/31/2019, 11:47 AM

## 2019-11-01 ENCOUNTER — Telehealth (HOSPITAL_COMMUNITY): Payer: Self-pay

## 2019-11-01 DIAGNOSIS — I428 Other cardiomyopathies: Secondary | ICD-10-CM

## 2019-11-01 NOTE — Telephone Encounter (Signed)
Pt aware of results and appreciative. MRI ordered. Pt advised she will get a call to schedule this appointment.

## 2019-11-01 NOTE — Telephone Encounter (Signed)
-----   Message from Larey Dresser, MD sent at 10/31/2019  4:33 PM EST ----- I reviewed echo, I think EF looks more like 40-45% (read as 30-35%).  Would like cardiac MRI done to more formally quantify EF.

## 2019-11-03 ENCOUNTER — Other Ambulatory Visit (HOSPITAL_COMMUNITY): Payer: Self-pay

## 2019-11-03 MED ORDER — LOSARTAN POTASSIUM 100 MG PO TABS: 100.0000 mg | ORAL_TABLET | Freq: Every day | ORAL | 3 refills | Status: DC

## 2019-11-20 ENCOUNTER — Other Ambulatory Visit (HOSPITAL_COMMUNITY): Payer: Self-pay | Admitting: *Deleted

## 2019-11-20 MED ORDER — CARVEDILOL 12.5 MG PO TABS: ORAL_TABLET | ORAL | 3 refills | Status: DC

## 2019-12-07 DIAGNOSIS — Z96651 Presence of right artificial knee joint: Secondary | ICD-10-CM | POA: Diagnosis not present

## 2019-12-07 DIAGNOSIS — Z96652 Presence of left artificial knee joint: Secondary | ICD-10-CM | POA: Diagnosis not present

## 2019-12-07 DIAGNOSIS — Z96653 Presence of artificial knee joint, bilateral: Secondary | ICD-10-CM | POA: Diagnosis not present

## 2019-12-07 DIAGNOSIS — Z471 Aftercare following joint replacement surgery: Secondary | ICD-10-CM | POA: Diagnosis not present

## 2019-12-18 ENCOUNTER — Other Ambulatory Visit (HOSPITAL_COMMUNITY): Payer: Self-pay | Admitting: Cardiology

## 2019-12-21 ENCOUNTER — Ambulatory Visit (HOSPITAL_COMMUNITY): Payer: Medicare Other | Admitting: Physician Assistant

## 2019-12-21 ENCOUNTER — Telehealth (HOSPITAL_COMMUNITY): Payer: Self-pay

## 2019-12-21 ENCOUNTER — Encounter (HOSPITAL_COMMUNITY): Payer: Self-pay | Admitting: *Deleted

## 2019-12-21 ENCOUNTER — Other Ambulatory Visit: Payer: Self-pay

## 2019-12-21 ENCOUNTER — Emergency Department (HOSPITAL_COMMUNITY)
Admission: EM | Admit: 2019-12-21 | Discharge: 2019-12-21 | Disposition: A | Discharge status: Home / Self Care | Payer: Medicare Other | Attending: Emergency Medicine | Admitting: Emergency Medicine

## 2019-12-21 DIAGNOSIS — Z7901 Long term (current) use of anticoagulants: Secondary | ICD-10-CM | POA: Diagnosis not present

## 2019-12-21 DIAGNOSIS — Z79899 Other long term (current) drug therapy: Secondary | ICD-10-CM | POA: Diagnosis not present

## 2019-12-21 DIAGNOSIS — E119 Type 2 diabetes mellitus without complications: Secondary | ICD-10-CM | POA: Diagnosis not present

## 2019-12-21 DIAGNOSIS — Z7984 Long term (current) use of oral hypoglycemic drugs: Secondary | ICD-10-CM | POA: Insufficient documentation

## 2019-12-21 DIAGNOSIS — I10 Essential (primary) hypertension: Secondary | ICD-10-CM | POA: Diagnosis not present

## 2019-12-21 DIAGNOSIS — R002 Palpitations: Secondary | ICD-10-CM | POA: Diagnosis present

## 2019-12-21 DIAGNOSIS — I4891 Unspecified atrial fibrillation: Secondary | ICD-10-CM | POA: Diagnosis not present

## 2019-12-21 LAB — CBC WITH DIFFERENTIAL/PLATELET
Abs Immature Granulocytes: 0.02 10*3/uL (ref 0.00–0.07)
Basophils Absolute: 0 10*3/uL (ref 0.0–0.1)
Basophils Relative: 1 %
Eosinophils Absolute: 0.1 10*3/uL (ref 0.0–0.5)
Eosinophils Relative: 2 %
HCT: 32.5 % — ABNORMAL LOW (ref 36.0–46.0)
Hemoglobin: 10.3 g/dL — ABNORMAL LOW (ref 12.0–15.0)
Immature Granulocytes: 0 %
Lymphocytes Relative: 27 %
Lymphs Abs: 1.8 10*3/uL (ref 0.7–4.0)
MCH: 30.7 pg (ref 26.0–34.0)
MCHC: 31.7 g/dL (ref 30.0–36.0)
MCV: 96.7 fL (ref 80.0–100.0)
Monocytes Absolute: 0.6 10*3/uL (ref 0.1–1.0)
Monocytes Relative: 9 %
Neutro Abs: 4.1 10*3/uL (ref 1.7–7.7)
Neutrophils Relative %: 61 %
Platelets: 289 10*3/uL (ref 150–400)
RBC: 3.36 MIL/uL — ABNORMAL LOW (ref 3.87–5.11)
RDW: 12.6 % (ref 11.5–15.5)
WBC: 6.6 10*3/uL (ref 4.0–10.5)
nRBC: 0 % (ref 0.0–0.2)

## 2019-12-21 LAB — BASIC METABOLIC PANEL
Anion gap: 9 (ref 5–15)
BUN: 26 mg/dL — ABNORMAL HIGH (ref 8–23)
CO2: 19 mmol/L — ABNORMAL LOW (ref 22–32)
Calcium: 9 mg/dL (ref 8.9–10.3)
Chloride: 111 mmol/L (ref 98–111)
Creatinine, Ser: 0.96 mg/dL (ref 0.44–1.00)
GFR calc Af Amer: >60 mL/min (ref >60)
GFR calc non Af Amer: 58 mL/min — ABNORMAL LOW (ref >60)
Glucose, Bld: 114 mg/dL — ABNORMAL HIGH (ref 70–99)
Potassium: 4.5 mmol/L (ref 3.5–5.1)
Sodium: 139 mmol/L (ref 135–145)

## 2019-12-21 LAB — TSH: TSH: 1.923 u[IU]/mL (ref 0.350–4.500)

## 2019-12-21 NOTE — Telephone Encounter (Signed)
She can take an extra dose of Coreg 12.5 x 1 to see if this helps, but if she stays this fast needs to go to the ER.  Would tell her that as long as she has been taking her Eliquis they could just cardiovert her in the ER and send her home in normal rhythm.  Would get her asap appt in afib clinic as she will likely need an anti-arrhythmic going forwards.

## 2019-12-21 NOTE — ED Triage Notes (Signed)
Pt reports feeling her heart beating fast . Pt has a machine that she can print out EKG. Pt HR 140. On arrival to ED HR 141 in room .

## 2019-12-21 NOTE — Telephone Encounter (Signed)
See note from scheduler. Note forwarded to MD as Juluis Rainier

## 2019-12-21 NOTE — Telephone Encounter (Signed)
Pt states she was feeling "funny."  Pt checked her heart rate/rhythm on the Cartia and displayed that her HR is 150-160's and possible Afib. Pt does not have any other symptoms. Denies chest pain, shortness of breath or dizziness. I advised she possible needs to go to ED however she does not want to because of covid. Please advise.

## 2019-12-21 NOTE — ED Provider Notes (Signed)
[place] [facility] EMERGENCY DEPARTMENT Provider Note   CSN: [id number] Arrival date & time: 12/21/19  1238     History Chief Complaint  Patient presents with  . Atrial Fibrillation    [name] is a 74 y.o. female.  Patient is a 74 year old female with past medical history of diabetes, atrial fibrillation, breast cancer, hypertension.  Patient is currently anticoagulated with Eliquis.  She presents today for evaluation of rapid heartbeat.  Patient woke up this morning believing she was in A. fib.  She called the doctor's office and was instructed to take an extra dose of Coreg which she did.  Her heart rate has not come down and she remains in A. fib.  She was instructed to come here for possible cardioversion at the instruction of Dr. Aundra Dubin.  Patient denies chest pain or difficulty breathing.  She does feel weak.  She has had 1 prior episode of A. fib and this was last year.  She has been doing fine since.  The history is provided by the patient.  Atrial Fibrillation This is a recurrent problem. The current episode started 3 to 5 hours ago. The problem occurs constantly. The problem has not changed since onset.Pertinent negatives include no chest pain and no shortness of breath. Nothing aggravates the symptoms. Nothing relieves the symptoms. She has tried nothing for the symptoms.       Past Medical History:  Diagnosis Date  . Allergy history unknown   . Arthritis    oa  . Asthma    takes acolate for  . Breast cancer ([place]) 1999; 2008   hx of bilateral breast cancer  . Breast cancer, left breast ([place]) 10/27/2012   3.2 cm ER positive, Her-2 negative, 1 node positive S/P mastectomy/TTRAM reconstruction 4/08  . Breast cancer, right breast ([place]) 10/27/2012   3.2 cm ER/PR positive, 1 node positive, Her-2 negative Rx w mastectomy, AC -T chemo, followed by aromatase inhibitor x 5 years dx 4/08  . Cancer [facility])    breast CA  . Cataracts, bilateral   . Dehydration after  exertion    after she went berry picking in the summer heat . see ED visit   . Diabetes ([place])   . DM2 (diabetes mellitus, type 2) ([place])    on welchol for  . Dyspnea    with exertion  . Elevated cholesterol   . GERD (gastroesophageal reflux disease)   . Hiatal hernia   . Hypertension   . LBBB (left bundle branch block) 10/27/2012  . Leg cramps   . NICM (nonischemic cardiomyopathy) ([place])    a. Echo:  06/14/12: Poor endocardial definition, possible septal HK, EF 50-55%, normal wall motion, mild LAE ;  b.  [facility] 9/13:  normal cors, EF 35-40%,  c. Echo 7/14: EF 50-55%, mild LAE  . Other fatigue 01/01/2015  . PONV (postoperative nausea and vomiting)   . Pulmonary embolus ([place]) 2001   after tram flap surgery    Patient Active Problem List   Diagnosis Date Noted  . Arthrofibrosis of total knee arthroplasty, sequela 03/07/2018  . Genetic testing 06/14/2015  . Paroxysmal atrial fibrillation ([place]) 02/12/2015  . Other fatigue 01/01/2015  . Cough 11/07/2012  . Asthma 11/07/2012  . LBBB (left bundle branch block) 10/27/2012  . Breast cancer, left breast ([place]) 10/27/2012  . Breast cancer, right breast ([place]) 10/27/2012  . Nonischemic cardiomyopathy ([place]) 09/21/2012  . Chest pain 07/20/2012  . DM w/o complication type II ([place]) 10/30/2011  .  DYSPNEA 07/25/2009  . Hyperlipidemia 09/19/2008  . Essential hypertension 09/19/2008    Past Surgical History:  Procedure Laterality Date  . DILATION AND CURETTAGE OF UTERUS   66yr ago   x 2  . KNEE CLOSED REDUCTION Right 06/01/2018   Procedure: CLOSED MANIPULATION RIGHT KNEE;  Surgeon: AGaynelle Arabian MD;  Location: [facility];  Service: Orthopedics;  Laterality: Right;  . KNEE CLOSED REDUCTION Left 02/22/2019   Procedure: CLOSED MANIPULATION KNEE;  Surgeon: AGaynelle Arabian MD;  Location: [facility];  Service: Orthopedics;  Laterality: Left;  149m  . KNEE SURGERY  2006   left torn cartilage repair  . LEFT HEART CATHETERIZATION WITH CORONARY ANGIOGRAM N/A  06/15/2012   Procedure: LEFT HEART CATHETERIZATION WITH CORONARY ANGIOGRAM;  Surgeon: MuWellington HampshireMD;  [facility];  Service: Cardiovascular;  Laterality: N/A;  . MASTECTOMY     bilateral  . RECONSTRUCTION BREAST W/ TRAM FLAP Left 2001  . TOTAL KNEE ARTHROPLASTY Right 03/07/2018   Procedure: RIGHT TOTAL KNEE ARTHROPLASTY;  Surgeon: AlGaynelle ArabianMD;  Location: [facility];  Service: Orthopedics;  Laterality: Right;  . TOTAL KNEE ARTHROPLASTY Left 10/31/2018   Procedure: TOTAL KNEE ARTHROPLASTY;  Surgeon: AlGaynelle ArabianMD;  Location: [facility];  Service: Orthopedics;  Laterality: Left;  5015m    OB History   No obstetric history on file.     Family History  Problem Relation Age of Onset  . Asthma Maternal Grandmother   . Breast cancer Maternal Grandmother        dx. 70s44s Stomach cancer Maternal Grandmother 60       lots of stomach problems  . Heart attack Paternal Grandfather   . Other Daughter        cyst on ovary; unilateral oophorectomy  . Heart attack Brother   . Cancer Maternal Aunt        unknown type  . Celiac disease Maternal Aunt   . Diabetes Maternal Uncle   . Colon cancer Cousin        dx. 50s-60s  . Colon cancer Paternal Aunt        dx. late 60s52s Lung cancer Paternal Aunt        dx. late 60s-70s; former smoker  . Heart attack Paternal Aunt   . Heart attack Paternal Uncle     Social History   Tobacco Use  . Smoking status: Never Smoker  . Smokeless tobacco: Never Used  Substance Use Topics  . Alcohol use: No  . Drug use: No    Home Medications Prior to Admission medications   Medication Sig Start Date End Date Taking? Authorizing Provider  acetaminophen (TYLENOL) 500 MG tablet Take 1,000 mg by mouth every 6 (six) hours as needed for mild pain or moderate pain.     [provider]  Ascorbic Acid (VITAMIN C WITH ROSE HIPS) 500 MG tablet Take 500 mg by mouth daily.    [provider]  atorvastatin (LIPITOR) 20 MG tablet  TAKE 2 TABLETS (40 MG TOTAL) BY MOUTH EVERY EVENING. 03/13/19   PicSusy FrizzleD  Blood Glucose Monitoring Suppl (ACCU-CHEK AVIVA PLUS) w/Device KIT Use to check FBS QAM - DX:E11.9 09/21/18   PicSusy FrizzleD  Blood Glucose Monitoring Suppl (ONE TOUCH ULTRA 2) W/DEVICE KIT Check FBS qam - Dx:250.00 and she will need lancets (1 box) , strips (1 bottle = 100) and controls also thanks 09/01/13   PicSusy FrizzleD  carvedilol (COREG)  12.5 MG tablet TAKE 1 TABLET IN THE MORNING AND 1.5 TABLETS IN THE EVENING 11/20/19   Larey Dresser, MD  clobetasol cream (TEMOVATE) 6.28 % Apply 1 application topically daily as needed (for itchy/discomfort).    [provider]  colesevelam (WELCHOL) 625 MG tablet TAKE 3 TABLETS BY MOUTH TWICE A DAY WITH A MEAL 04/06/19   Susy Frizzle, MD  DEXILANT 60 MG capsule TAKE ONE CAPSULE BY MOUTH EVERY DAY 08/21/19   Susy Frizzle, MD  ELIQUIS 5 MG TABS tablet TAKE 1 TABLET BY MOUTH TWICE A DAY 12/18/19   Larey Dresser, MD  fluticasone [facility]) 50 MCG/ACT nasal spray USE 2 SPRAYS IN EACH NOSTRIL DAILY FOR 3 MONTHS 03/16/19   Susy Frizzle, MD  Ginkgo Biloba 120 MG TABS Take 120 mg by mouth daily.    [provider]  Glucosamine-Chondroitin (COSAMIN DS PO) Take 1 tablet by mouth 2 (two) times a day.    [provider]  glucose blood test strip Use to check BS QAM DX: E11.9 03/16/19   Susy Frizzle, MD  Lancets (ACCU-CHEK SOFT TOUCH) lancets Use to check BS QAM DX: E11.9 09/21/18   Susy Frizzle, MD  losartan (COZAAR) 100 MG tablet Take 1 tablet (100 mg total) by mouth daily. 11/03/19   Larey Dresser, MD  Multiple Vitamin (MULTIVITAMIN WITH MINERALS) TABS tablet Take 1 tablet by mouth daily. Senior Multivitamin Iron Free Formula    [provider]  spironolactone (ALDACTONE) 25 MG tablet TAKE 1 TABLET BY MOUTH EVERY DAY 12/18/19   Larey Dresser, MD  vitamin B-12 (CYANOCOBALAMIN) 1000 MCG tablet Take 1,000 mcg by  mouth daily.    [provider]  zafirlukast (ACCOLATE) 20 MG tablet TAKE 1 TABLET (20 MG TOTAL) BY MOUTH 2 (TWO) TIMES DAILY. 07/17/19   Susy Frizzle, MD    Allergies    Adhesive [tape], Diphenhydramine, and Vicodin [hydrocodone-acetaminophen]  Review of Systems   Review of Systems  Respiratory: Negative for shortness of breath.   Cardiovascular: Negative for chest pain.  All other systems reviewed and are negative.   Physical Exam Updated Vital Signs There were no vitals taken for this visit.  Physical Exam Vitals and nursing note reviewed.  Constitutional:      General: She is not in acute distress.    Appearance: She is well-developed. She is not diaphoretic.  HENT:     Head: Normocephalic and atraumatic.  Cardiovascular:     Rate and Rhythm: Tachycardia present. Rhythm irregular.     Heart sounds: No murmur. No friction rub. No gallop.      Comments: Heart is irregularly irregular and rapid. Pulmonary:     Effort: Pulmonary effort is normal. No respiratory distress.     Breath sounds: Normal breath sounds. No wheezing.  Abdominal:     General: Bowel sounds are normal. There is no distension.     Palpations: Abdomen is soft.     Tenderness: There is no abdominal tenderness.  Musculoskeletal:        General: Normal range of motion.     Cervical back: Normal range of motion and neck supple.  Skin:    General: Skin is warm and dry.  Neurological:     Mental Status: She is alert and oriented to person, place, and time.     ED Results / Procedures / Treatments   Labs (all labs ordered are listed, but only abnormal results are displayed) Labs Reviewed -  No data to display  EKG ED ECG REPORT   Date: 12/21/2019  Rate: 145  Rhythm: atrial fibrillation  QRS Axis: left  Intervals: normal  ST/T Wave abnormalities: nonspecific T wave changes  Conduction Disutrbances:left bundle branch block  Narrative Interpretation:   Old EKG Reviewed: changes  noted  I have personally reviewed the EKG tracing and agree with the computerized printout as noted.   ED ECG REPORT   Date: 12/21/2019  Rate: 83  Rhythm: normal sinus rhythm  QRS Axis: normal  Intervals: normal  ST/T Wave abnormalities: normal  Conduction Disutrbances:left bundle branch block  Narrative Interpretation:   Old EKG Reviewed: unchanged  I have personally reviewed the EKG tracing and agree with the computerized printout as noted.    Radiology No results found.  Procedures Procedures (including critical care time)  Medications Ordered in ED Medications - No data to display  ED Course  I have reviewed the triage vital signs and the nursing notes.  Pertinent labs & imaging results that were available during my care of the patient were reviewed by me and considered in my medical decision making (see chart for details).    MDM Rules/Calculators/A&P  Patient presenting here with complaints of palpitations.  She arrives in atrial fibrillation with rapid ventricular response.  Laboratory studies were obtained and the plan was to proceed with cardioversion.  Prior to this being performed, the patient did spontaneously return to a sinus rhythm.  Laboratory studies are unremarkable and patient has no symptoms at present.  At this point, I feel as though discharge is appropriate.  She is to follow-up with her cardiologist and return as needed.  Final Clinical Impression(s) / ED Diagnoses Final diagnoses:  None    Rx / DC Orders ED Discharge Orders    None       Veryl Speak, MD 12/21/19 1442

## 2019-12-21 NOTE — ED Notes (Signed)
Patient verbalizes understanding of discharge instructions . Opportunity for questions and answers were provided . Armband removed by staff ,Pt discharged from ED. W/C  offered at D/C  and Declined W/C at D/C and was escorted to lobby by RN.  

## 2019-12-21 NOTE — Discharge Instructions (Addendum)
Continue medications as previously prescribed.  Follow-up with Dr. Aundra Dubin in the next 1 to 2 weeks, and return to the ER if you develop any new and/or concerning symptoms.

## 2019-12-21 NOTE — Telephone Encounter (Signed)
Spoke with patient again, advised of MD recommendations to take an extra 12.5mg  dose of Coreg.  Advised if rate does not slow, she needs to go to ED for them to cardiovert.  She states she has not missed a dose of Eliquis. Message sent to [facility] to schedule patient for an appt with Afib clinic. Advised her to call us if there is not break in HR.  Pt verbalized understanding of all.

## 2019-12-21 NOTE — Telephone Encounter (Signed)
-----   Message from Kara Mead sent at 12/21/2019 11:25 AM EDT ----- Called and spoke with pt.  If she does not go to ED, I have her scheduled for today at 2:30 with Ricky.  I told her if she goes to ED, then call and we will see her next wk post dccv  Will you let Aundra Dubin know? Thanks ----- Message ----- From: Valeda Malm, RN Sent: 12/21/2019  10:40 AM EDT To: Nathanial Rancher,    Can you arrange an appointment with the afib clinic for this patient? ASAP per DM.    Thanks [employer]

## 2019-12-22 ENCOUNTER — Encounter (HOSPITAL_COMMUNITY): Payer: Self-pay | Admitting: Physician Assistant

## 2019-12-22 ENCOUNTER — Ambulatory Visit (HOSPITAL_COMMUNITY)
Admission: RE | Admit: 2019-12-22 | Discharge: 2019-12-22 | Disposition: A | Discharge status: Home / Self Care | Payer: Medicare Other | Source: Ambulatory Visit | Attending: Physician Assistant | Admitting: Physician Assistant

## 2019-12-22 VITALS — BP 114/66 | HR 65 | Ht 62.5 in | Wt 182.8 lb

## 2019-12-22 DIAGNOSIS — I480 Paroxysmal atrial fibrillation: Secondary | ICD-10-CM | POA: Insufficient documentation

## 2019-12-22 DIAGNOSIS — Z79899 Other long term (current) drug therapy: Secondary | ICD-10-CM | POA: Insufficient documentation

## 2019-12-22 DIAGNOSIS — E669 Obesity, unspecified: Secondary | ICD-10-CM | POA: Insufficient documentation

## 2019-12-22 DIAGNOSIS — Z6832 Body mass index (BMI) 32.0-32.9, adult: Secondary | ICD-10-CM | POA: Insufficient documentation

## 2019-12-22 DIAGNOSIS — D6869 Other thrombophilia: Secondary | ICD-10-CM | POA: Diagnosis not present

## 2019-12-22 DIAGNOSIS — Z7901 Long term (current) use of anticoagulants: Secondary | ICD-10-CM | POA: Insufficient documentation

## 2019-12-22 DIAGNOSIS — I428 Other cardiomyopathies: Secondary | ICD-10-CM | POA: Insufficient documentation

## 2019-12-22 DIAGNOSIS — E785 Hyperlipidemia, unspecified: Secondary | ICD-10-CM | POA: Insufficient documentation

## 2019-12-22 DIAGNOSIS — I110 Hypertensive heart disease with heart failure: Secondary | ICD-10-CM | POA: Insufficient documentation

## 2019-12-22 DIAGNOSIS — M1990 Unspecified osteoarthritis, unspecified site: Secondary | ICD-10-CM | POA: Insufficient documentation

## 2019-12-22 DIAGNOSIS — E118 Type 2 diabetes mellitus with unspecified complications: Secondary | ICD-10-CM | POA: Insufficient documentation

## 2019-12-22 DIAGNOSIS — I5022 Chronic systolic (congestive) heart failure: Secondary | ICD-10-CM | POA: Insufficient documentation

## 2019-12-22 NOTE — Progress Notes (Signed)
Primary Care Physician: Susy Frizzle, MD Primary Cardiologist: Dr Aundra Dubin Primary Electrophysiologist: none Referring Physician: Dr Milon Score [name] is a 74 y.o. female with a history of HTN, DM, systolic CHF, asthma, HLD, and paroxysmal atrial fibrillation who presents for consultation in the [facility].  The patient was initially diagnosed with atrial fibrillation 12/2014 after presenting with symptoms of increased dyspnea. Her Jodelle Red showed atrial fibrillation. Patient is on Eliquis for a CHADS2VASC score of 6. On 12/21/19, patient reported that she was "feeling funny" and checked her Jodelle Red which showed rapid afib. She took an extra dose of Coreg but her rapid rates persistent and she presented to the ER. She spontaneously converted in the ER. There were no specific triggers that she could identify. She denies snoring or alcohol use.  Today, she denies symptoms of palpitations, chest pain, shortness of breath, orthopnea, PND, lower extremity edema, dizziness, presyncope, syncope, snoring, daytime somnolence, bleeding, or neurologic sequela. The patient is tolerating medications without difficulties and is otherwise without complaint today.    Atrial Fibrillation Risk Factors:  she does not have symptoms or diagnosis of sleep apnea. she does not have a history of rheumatic fever. she does not have a history of alcohol use. The patient does not have a history of early familial atrial fibrillation or other arrhythmias.  she has a BMI of Body mass index is 32.9 kg/m.Marland Kitchen Filed Weights   12/22/19 1106  Weight: 82.9 kg    Family History  Problem Relation Age of Onset  . Asthma Maternal Grandmother   . Breast cancer Maternal Grandmother        dx. 65s  . Stomach cancer Maternal Grandmother 60       lots of stomach problems  . Heart attack Paternal Grandfather   . Other Daughter        cyst on ovary; unilateral oophorectomy  . Heart attack  Brother   . Cancer Maternal Aunt        unknown type  . Celiac disease Maternal Aunt   . Diabetes Maternal Uncle   . Colon cancer Cousin        dx. 50s-60s  . Colon cancer Paternal Aunt        dx. late 78s  . Lung cancer Paternal Aunt        dx. late 60s-70s; former smoker  . Heart attack Paternal Aunt   . Heart attack Paternal Uncle      Atrial Fibrillation Management history:  Previous antiarrhythmic drugs: none Previous cardioversions: none Previous ablations: none CHADS2VASC score: 6 Anticoagulation history: Eliquis   Past Medical History:  Diagnosis Date  . Allergy history unknown   . Arthritis    oa  . Asthma    takes acolate for  . Breast cancer ([place]) 1999; 2008   hx of bilateral breast cancer  . Breast cancer, left breast ([place]) 10/27/2012   3.2 cm ER positive, Her-2 negative, 1 node positive S/P mastectomy/TTRAM reconstruction 4/08  . Breast cancer, right breast ([place]) 10/27/2012   3.2 cm ER/PR positive, 1 node positive, Her-2 negative Rx w mastectomy, AC -T chemo, followed by aromatase inhibitor x 5 years dx 4/08  . Cancer [facility])    breast CA  . Cataracts, bilateral   . Dehydration after exertion    after she went berry picking in the summer heat . see ED visit   . Diabetes ([place])   . DM2 (diabetes mellitus, type 2) ([place])  on welchol for  . Dyspnea    with exertion  . Elevated cholesterol   . GERD (gastroesophageal reflux disease)   . Hiatal hernia   . Hypertension   . LBBB (left bundle branch block) 10/27/2012  . Leg cramps   . NICM (nonischemic cardiomyopathy) ([place])    a. Echo:  06/14/12: Poor endocardial definition, possible septal HK, EF 50-55%, normal wall motion, mild LAE ;  b.  [facility] 9/13:  normal cors, EF 35-40%,  c. Echo 7/14: EF 50-55%, mild LAE  . Other fatigue 01/01/2015  . PONV (postoperative nausea and vomiting)   . Pulmonary embolus ([place]) 2001   after tram flap surgery   Past Surgical History:  Procedure Laterality Date  . DILATION AND  CURETTAGE OF UTERUS   55yr ago   x 2  . KNEE CLOSED REDUCTION Right 06/01/2018   Procedure: CLOSED MANIPULATION RIGHT KNEE;  Surgeon: AGaynelle Arabian MD;  Location: [facility];  Service: Orthopedics;  Laterality: Right;  . KNEE CLOSED REDUCTION Left 02/22/2019   Procedure: CLOSED MANIPULATION KNEE;  Surgeon: AGaynelle Arabian MD;  [facility]: [facility];  Service: Orthopedics;  Laterality: Left;  163m  . KNEE SURGERY  2006   left torn cartilage repair  . LEFT HEART CATHETERIZATION WITH CORONARY ANGIOGRAM N/A 06/15/2012   Procedure: LEFT HEART CATHETERIZATION WITH CORONARY ANGIOGRAM;  Surgeon: MuWellington HampshireMD;  [facility];  Service: Cardiovascular;  Laterality: N/A;  . MASTECTOMY     bilateral  . RECONSTRUCTION BREAST W/ TRAM FLAP Left 2001  . TOTAL KNEE ARTHROPLASTY Right 03/07/2018   Procedure: RIGHT TOTAL KNEE ARTHROPLASTY;  Surgeon: AlGaynelle ArabianMD;  [facility];  Service: Orthopedics;  Laterality: Right;  . TOTAL KNEE ARTHROPLASTY Left 10/31/2018   Procedure: TOTAL KNEE ARTHROPLASTY;  Surgeon: AlGaynelle ArabianMD;  [facility];  Service: Orthopedics;  Laterality: Left;  5062m   Current Outpatient Medications  Medication Sig Dispense Refill  . acetaminophen (TYLENOL) 500 MG tablet Take 1,000 mg by mouth every 6 (six) hours as needed for mild pain or moderate pain.     . Ascorbic Acid (VITAMIN C WITH ROSE HIPS) 500 MG tablet Take 500 mg by mouth daily.    . aMarland Kitchenorvastatin (LIPITOR) 20 MG tablet TAKE 2 TABLETS (40 MG TOTAL) BY MOUTH EVERY EVENING. 180 tablet 2  . Blood Glucose Monitoring Suppl (ACCU-CHEK AVIVA PLUS) w/Device KIT Use to check FBS QAM - DX:E11.9 1 kit 1  . Blood Glucose Monitoring Suppl (ONE TOUCH ULTRA 2) W/DEVICE KIT Check FBS qam - Dx:250.00 and she will need lancets (1 box) , strips (1 bottle = 100) and controls also thanks 1 each 0  . carvedilol (COREG) 12.5 MG tablet TAKE 1 TABLET IN THE MORNING AND 1.5 TABLETS IN THE EVENING 225 tablet 3  . colesevelam  (WELCHOL) 625 MG tablet TAKE 3 TABLETS BY MOUTH TWICE A DAY WITH A MEAL 540 tablet 3  . DEXILANT 60 MG capsule TAKE ONE CAPSULE BY MOUTH EVERY DAY 90 capsule 3  . ELIQUIS 5 MG TABS tablet TAKE 1 TABLET BY MOUTH TWICE A DAY 180 tablet 3  . fluticasone (FLONASE) 50 MCG/ACT nasal spray USE 2 SPRAYS IN EACH NOSTRIL DAILY FOR 3 MONTHS 48 g 3  . Ginkgo Biloba 120 MG TABS Take 120 mg by mouth daily.    . Glucosamine-Chondroitin (COSAMIN DS PO) Take 1 tablet by mouth 2 (two) times a day.    . gMarland Kitchenucose blood test strip Use to check  BS QAM DX: E11.9 100 each 3  . Lancets (ACCU-CHEK SOFT TOUCH) lancets Use to check BS QAM DX: E11.9 100 each 3  . losartan (COZAAR) 100 MG tablet Take 1 tablet (100 mg total) by mouth daily. 90 tablet 3  . Multiple Vitamin (MULTIVITAMIN WITH MINERALS) TABS tablet Take 1 tablet by mouth daily. Senior Multivitamin Iron Free Formula    . spironolactone (ALDACTONE) 25 MG tablet TAKE 1 TABLET BY MOUTH EVERY DAY 90 tablet 3  . vitamin B-12 (CYANOCOBALAMIN) 1000 MCG tablet Take 1,000 mcg by mouth daily.    . zafirlukast (ACCOLATE) 20 MG tablet TAKE 1 TABLET (20 MG TOTAL) BY MOUTH 2 (TWO) TIMES DAILY. 180 tablet 4   No current facility-administered medications for this encounter.    Allergies  Allergen Reactions  . Adhesive [Tape] Other (See Comments)    Skin turns red   . Diphenhydramine Other (See Comments)    Pt feels wired, hyperactive  . Vicodin [Hydrocodone-Acetaminophen] Nausea And Vomiting    Social History   Socioeconomic History  . Marital status: Married    Spouse name: Not on file  . Number of children: 2  . Years of education: Not on file  . Highest education level: Not on file  Occupational History  . 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 Smoking status: Never Smoker  . Smokeless tobacco: Never Used  Substance and Sexual Activity  . Alcohol use: No  . Drug use: No  . Sexual activity: Never   Other Topics Concern  . Not on file  Social History Narrative  . Not on file   Social Determinants of Health   Financial Resource Strain:   . Difficulty of Paying Living Expenses:   Food Insecurity:   . Worried About Charity fundraiser in the Last Year:   . Arboriculturist in the Last Year:   Transportation Needs:   . Film/video editor (Medical):   Marland Kitchen Lack of Transportation (Non-Medical):   Physical Activity:   . Days of Exercise per Week:   . Minutes of Exercise per Session:   Stress:   . Feeling of Stress :   Social Connections:   . Frequency of Communication with Friends and Family:   . Frequency of Social Gatherings with Friends and Family:   . Attends Religious Services:   . Active Member of Clubs or Organizations:   . Attends Archivist Meetings:   Marland Kitchen Marital Status:   Intimate Partner Violence:   . Fear of Current or Ex-Partner:   . Emotionally Abused:   Marland Kitchen Physically Abused:   . Sexually Abused:      ROS- All systems are reviewed and negative except as per the HPI above.  Physical Exam: Vitals:   12/22/19 1106  BP: 114/66  Pulse: 65  Weight: 82.9 kg  Height: 5' 2.5" (1.588 m)    GEN- The patient is well appearing obese elderly female, alert and oriented x 3 today.   Head- normocephalic, atraumatic Eyes-  Sclera clear, conjunctiva pink Ears- hearing intact Oropharynx- clear Neck- supple  Lungs- Clear to ausculation bilaterally, normal work of breathing Heart- Regular rate and rhythm, no murmurs, rubs or gallops  GI- soft, NT, ND, + BS Extremities- no clubbing, cyanosis, or edema MS- no significant deformity or atrophy Skin- no rash or lesion Psych- euthymic mood, full affect Neuro- strength and sensation are intact  Wt Readings from  Last 3 Encounters:  12/22/19 82.9 kg  12/21/19 77.1 kg  10/17/19 81.6 kg    EKG today demonstrates SR HR 65, LBBB, PR 194, QRS 154, QTc 451  Echo 10/31/19 demonstrated  1. Left ventricular ejection  fraction, by visual estimation, is 30 to  35%. The left ventricle has moderately decreased function. There is no  left ventricular hypertrophy.  2. Left ventricular diastolic parameters are consistent with Grade I  diastolic dysfunction (impaired relaxation).  3. The left ventricle demonstrates global hypokinesis.  4. Global right ventricle has normal systolic function.The right  ventricular size is normal. No increase in right ventricular wall  thickness.  5. Left atrial size was normal.  6. Right atrial size was normal.  7. The mitral valve is normal in structure. Trivial mitral valve  regurgitation. No evidence of mitral stenosis.  8. The tricuspid valve is normal in structure.  9. The tricuspid valve is normal in structure. Tricuspid valve  regurgitation is trivial.  10. The aortic valve is normal in structure. Aortic valve regurgitation is  not visualized. No evidence of aortic valve sclerosis or stenosis.  11. The pulmonic valve was grossly normal. Pulmonic valve regurgitation is  trivial.  12. The atrial septum is grossly normal.   Epic records are reviewed at length today  CHA2DS2-VASc Score = 6 The patient's score is based upon: CHF History: Yes HTN History: Yes Age : 57 + Diabetes History: Yes Stroke History: No Vascular Disease History: No Gender: Female      ASSESSMENT AND PLAN: 1. Paroxysmal Atrial Fibrillation (ICD10:  I48.0) The patient's CHA2DS2-VASc score is 6, indicating a 9.7% annual risk of stroke.   We discussed therapeutic options today including AAD therapy. Would be hesitant to use Multaq or class 1C with reduced EF. Patient would prefer to not be hospitalized for dofetilide admission. Amiodarone may be best option. Patient would like to continue present therapy with PRN dose of BB for now. She would consider starting amiodarone if her afib becomes more persistent.  Continue Eliquis 5 mg BID. Continue Coreg 12.5 mg AM and 18.75 mg PM  2.  Secondary Hypercoagulable State (ICD10:  D68.69) The patient is at significant risk for stroke/thromboembolism based upon her CHA2DS2-VASc Score of 6.  Continue Apixaban (Eliquis).   3. Obesity Body mass index is 32.9 kg/m. Lifestyle modification was discussed at length including regular exercise and weight reduction.  4. HTN Stable, no changes today.  5. NICM No signs or symptoms of fluid overload. Followed by Dr Aundra Dubin.   Follow up in the AF clinic in one month.    [facility] [address] [place], [place] [address] [phone] 12/22/2019 12:28 PM

## 2019-12-29 ENCOUNTER — Other Ambulatory Visit: Payer: Self-pay | Admitting: Family Medicine

## 2020-01-04 DIAGNOSIS — H25811 Combined forms of age-related cataract, right eye: Secondary | ICD-10-CM | POA: Diagnosis not present

## 2020-01-13 ENCOUNTER — Emergency Department (HOSPITAL_COMMUNITY)
Admission: EM | Admit: 2020-01-13 | Discharge: 2020-01-13 | Disposition: A | Discharge status: Home / Self Care | Payer: Medicare Other | Attending: Emergency Medicine | Admitting: Emergency Medicine

## 2020-01-13 ENCOUNTER — Emergency Department (HOSPITAL_COMMUNITY): Payer: Medicare Other

## 2020-01-13 ENCOUNTER — Encounter (HOSPITAL_COMMUNITY): Payer: Self-pay | Admitting: Emergency Medicine

## 2020-01-13 ENCOUNTER — Other Ambulatory Visit: Payer: Self-pay

## 2020-01-13 DIAGNOSIS — Z79899 Other long term (current) drug therapy: Secondary | ICD-10-CM | POA: Diagnosis not present

## 2020-01-13 DIAGNOSIS — X58XXXA Exposure to other specified factors, initial encounter: Secondary | ICD-10-CM | POA: Insufficient documentation

## 2020-01-13 DIAGNOSIS — R110 Nausea: Secondary | ICD-10-CM | POA: Diagnosis not present

## 2020-01-13 DIAGNOSIS — I10 Essential (primary) hypertension: Secondary | ICD-10-CM | POA: Diagnosis not present

## 2020-01-13 DIAGNOSIS — J45909 Unspecified asthma, uncomplicated: Secondary | ICD-10-CM | POA: Insufficient documentation

## 2020-01-13 DIAGNOSIS — E119 Type 2 diabetes mellitus without complications: Secondary | ICD-10-CM | POA: Insufficient documentation

## 2020-01-13 DIAGNOSIS — Y929 Unspecified place or not applicable: Secondary | ICD-10-CM | POA: Diagnosis not present

## 2020-01-13 DIAGNOSIS — S3991XA Unspecified injury of abdomen, initial encounter: Secondary | ICD-10-CM | POA: Diagnosis present

## 2020-01-13 DIAGNOSIS — S39011A Strain of muscle, fascia and tendon of abdomen, initial encounter: Secondary | ICD-10-CM | POA: Insufficient documentation

## 2020-01-13 DIAGNOSIS — Z96653 Presence of artificial knee joint, bilateral: Secondary | ICD-10-CM | POA: Insufficient documentation

## 2020-01-13 DIAGNOSIS — R079 Chest pain, unspecified: Secondary | ICD-10-CM

## 2020-01-13 DIAGNOSIS — R350 Frequency of micturition: Secondary | ICD-10-CM | POA: Diagnosis not present

## 2020-01-13 DIAGNOSIS — Y939 Activity, unspecified: Secondary | ICD-10-CM | POA: Insufficient documentation

## 2020-01-13 DIAGNOSIS — R109 Unspecified abdominal pain: Secondary | ICD-10-CM | POA: Diagnosis not present

## 2020-01-13 DIAGNOSIS — Z853 Personal history of malignant neoplasm of breast: Secondary | ICD-10-CM | POA: Diagnosis not present

## 2020-01-13 DIAGNOSIS — Y999 Unspecified external cause status: Secondary | ICD-10-CM | POA: Diagnosis not present

## 2020-01-13 DIAGNOSIS — S29011A Strain of muscle and tendon of front wall of thorax, initial encounter: Secondary | ICD-10-CM | POA: Diagnosis not present

## 2020-01-13 DIAGNOSIS — Z7901 Long term (current) use of anticoagulants: Secondary | ICD-10-CM | POA: Insufficient documentation

## 2020-01-13 DIAGNOSIS — T148XXA Other injury of unspecified body region, initial encounter: Secondary | ICD-10-CM

## 2020-01-13 DIAGNOSIS — R0789 Other chest pain: Secondary | ICD-10-CM | POA: Diagnosis not present

## 2020-01-13 LAB — URINALYSIS, ROUTINE W REFLEX MICROSCOPIC
Bilirubin Urine: NEGATIVE
Glucose, UA: NEGATIVE mg/dL
Hgb urine dipstick: NEGATIVE
Ketones, ur: NEGATIVE mg/dL
Leukocytes,Ua: NEGATIVE
Nitrite: NEGATIVE
Protein, ur: NEGATIVE mg/dL
Specific Gravity, Urine: 1.011 (ref 1.005–1.030)
pH: 5 (ref 5.0–8.0)

## 2020-01-13 MED ORDER — TRAMADOL HCL 50 MG PO TABS
50.0000 mg | ORAL_TABLET | Freq: Once | ORAL | Status: AC
  Administered 2020-01-13: 50 mg via ORAL
  Filled 2020-01-13: qty 1

## 2020-01-13 NOTE — ED Notes (Signed)
Pt transported to CT ?

## 2020-01-13 NOTE — ED Triage Notes (Signed)
C/o L flank pain since last night.  States pain was initially constant but now intermittent.  Also reports frequent urination.  Denies nausea and vomiting.

## 2020-01-13 NOTE — Discharge Instructions (Addendum)
Use your Ultram as directed for pain.  Your x-rays here were all negative as well as your urinalysis

## 2020-01-13 NOTE — ED Notes (Signed)
Pt back from CT

## 2020-01-13 NOTE — ED Notes (Signed)
Pt transported to Xray. 

## 2020-01-13 NOTE — ED Provider Notes (Signed)
[place] [facility] EMERGENCY DEPARTMENT Provider Note   CSN: [id number] Arrival date & time: 01/13/20  1034     History Chief Complaint  Patient presents with  . Flank Pain    [name] is a 74 y.o. female.  74 year old female presents with acute onset of left-sided flank pain which began yesterday.  Pain was constant initially now has been colicky.  No hematuria or dysuria.  Has had some urinary frequency.  No rashes to the area.  Has had nausea but no emesis.  No fever or chills.  Has been medicating with Tylenol with temporary relief.  No prior history of same.  Symptoms are better with heat.  Denies any pleuritic pain or shortness of breath.        Past Medical History:  Diagnosis Date  . Allergy history unknown   . Arthritis    oa  . Asthma    takes acolate for  . Breast cancer ([place]) 1999; 2008   hx of bilateral breast cancer  . Breast cancer, left breast ([place]) 10/27/2012   3.2 cm ER positive, Her-2 negative, 1 node positive S/P mastectomy/TTRAM reconstruction 4/08  . Breast cancer, right breast ([place]) 10/27/2012   3.2 cm ER/PR positive, 1 node positive, Her-2 negative Rx w mastectomy, AC -T chemo, followed by aromatase inhibitor x 5 years dx 4/08  . Cancer [facility])    breast CA  . Cataracts, bilateral   . Dehydration after exertion    after she went berry picking in the summer heat . see ED visit   . Diabetes ([place])   . DM2 (diabetes mellitus, type 2) ([place])    on welchol for  . Dyspnea    with exertion  . Elevated cholesterol   . GERD (gastroesophageal reflux disease)   . Hiatal hernia   . Hypertension   . LBBB (left bundle branch block) 10/27/2012  . Leg cramps   . NICM (nonischemic cardiomyopathy) ([place])    a. Echo:  06/14/12: Poor endocardial definition, possible septal HK, EF 50-55%, normal wall motion, mild LAE ;  b.  [facility] 9/13:  normal cors, EF 35-40%,  c. Echo 7/14: EF 50-55%, mild LAE  . Other fatigue 01/01/2015  . PONV (postoperative nausea  and vomiting)   . Pulmonary embolus ([place]) 2001   after tram flap surgery    Patient Active Problem List   Diagnosis Date Noted  . Secondary hypercoagulable state ([place]) 12/22/2019  . Arthrofibrosis of total knee arthroplasty, sequela 03/07/2018  . Genetic testing 06/14/2015  . Paroxysmal atrial fibrillation ([place]) 02/12/2015  . Other fatigue 01/01/2015  . Cough 11/07/2012  . Asthma 11/07/2012  . LBBB (left bundle branch block) 10/27/2012  . Breast cancer, left breast ([place]) 10/27/2012  . Breast cancer, right breast ([place]) 10/27/2012  . Nonischemic cardiomyopathy ([place]) 09/21/2012  . Chest pain 07/20/2012  . DM w/o complication type II ([place]) 10/30/2011  . DYSPNEA 07/25/2009  . Hyperlipidemia 09/19/2008  . Essential hypertension 09/19/2008    Past Surgical History:  Procedure Laterality Date  . DILATION AND CURETTAGE OF UTERUS   34yr ago   x 2  . KNEE CLOSED REDUCTION Right 06/01/2018   Procedure: CLOSED MANIPULATION RIGHT KNEE;  Surgeon: AGaynelle Arabian MD;  [facility];  Service: Orthopedics;  Laterality: Right;  . KNEE CLOSED REDUCTION Left 02/22/2019   Procedure: CLOSED MANIPULATION KNEE;  Surgeon: AGaynelle Arabian MD;  [facility];  Service: Orthopedics;  Laterality: Left;  175m  . KNEE SURGERY  2006   left torn cartilage repair  . LEFT HEART CATHETERIZATION WITH CORONARY ANGIOGRAM N/A 06/15/2012   Procedure: LEFT HEART CATHETERIZATION WITH CORONARY ANGIOGRAM;  Surgeon: Wellington Hampshire, MD;  Location: [facility] CATH LAB;  Service: Cardiovascular;  Laterality: N/A;  . MASTECTOMY     bilateral  . RECONSTRUCTION BREAST W/ TRAM FLAP Left 2001  . TOTAL KNEE ARTHROPLASTY Right 03/07/2018   Procedure: RIGHT TOTAL KNEE ARTHROPLASTY;  Surgeon: Gaynelle Arabian, MD;  [facility];  Service: Orthopedics;  Laterality: Right;  . TOTAL KNEE ARTHROPLASTY Left 10/31/2018   Procedure: TOTAL KNEE ARTHROPLASTY;  Surgeon: Gaynelle Arabian, MD;  [facility];  Service: Orthopedics;   Laterality: Left;  13mn     OB History   No obstetric history on file.     Family History  Problem Relation Age of Onset  . Asthma Maternal Grandmother   . Breast cancer Maternal Grandmother        dx. 746s . Stomach cancer Maternal Grandmother 60       lots of stomach problems  . Heart attack Paternal Grandfather   . Other Daughter        cyst on ovary; unilateral oophorectomy  . Heart attack Brother   . Cancer Maternal Aunt        unknown type  . Celiac disease Maternal Aunt   . Diabetes Maternal Uncle   . Colon cancer Cousin        dx. 50s-60s  . Colon cancer Paternal Aunt        dx. late 668s . Lung cancer Paternal Aunt        dx. late 60s-70s; former smoker  . Heart attack Paternal Aunt   . Heart attack Paternal Uncle     Social History   Tobacco Use  . Smoking status: Never Smoker  . Smokeless tobacco: Never Used  Substance Use Topics  . Alcohol use: No  . Drug use: No    Home Medications Prior to Admission medications   Medication Sig Start Date End Date Taking? Authorizing Provider  acetaminophen (TYLENOL) 500 MG tablet Take 1,000 mg by mouth every 6 (six) hours as needed for mild pain or moderate pain.     [provider]  Ascorbic Acid (VITAMIN C WITH ROSE HIPS) 500 MG tablet Take 500 mg by mouth daily.    [provider]  atorvastatin (LIPITOR) 20 MG tablet TAKE 2 TABLETS (40 MG TOTAL) BY MOUTH EVERY EVENING. 12/29/19   PSusy Frizzle MD  Blood Glucose Monitoring Suppl (ACCU-CHEK AVIVA PLUS) w/Device KIT Use to check FBS QAM - DX:E11.9 09/21/18   PSusy Frizzle MD  Blood Glucose Monitoring Suppl (ONE TOUCH ULTRA 2) W/DEVICE KIT Check FBS qam - Dx:250.00 and she will need lancets (1 box) , strips (1 bottle = 100) and controls also thanks 09/01/13   PSusy Frizzle MD  carvedilol (COREG) 12.5 MG tablet TAKE 1 TABLET IN THE MORNING AND 1.5 TABLETS IN THE EVENING 11/20/19   MLarey Dresser MD  colesevelam ([facility] 625 MG tablet  TAKE 3 TABLETS BY MOUTH TWICE A DAY WITH A MEAL 04/06/19   PSusy Frizzle MD  DEXILANT 60 MG capsule TAKE ONE CAPSULE BY MOUTH EVERY DAY 08/21/19   PSusy Frizzle MD  ELIQUIS 5 MG TABS tablet TAKE 1 TABLET BY MOUTH TWICE A DAY 12/18/19   MLarey Dresser MD  fluticasone (FLONASE) 50 MCG/ACT nasal spray USE 2 SPRAYS IN EACH NOSTRIL DAILY  FOR 3 MONTHS 03/16/19   Susy Frizzle, MD  Ginkgo Biloba 120 MG TABS Take 120 mg by mouth daily.    [provider]  Glucosamine-Chondroitin (COSAMIN DS PO) Take 1 tablet by mouth 2 (two) times a day.    [provider]  glucose blood test strip Use to check BS QAM DX: E11.9 03/16/19   Susy Frizzle, MD  Lancets (ACCU-CHEK SOFT TOUCH) lancets Use to check BS QAM DX: E11.9 09/21/18   Susy Frizzle, MD  losartan (COZAAR) 100 MG tablet Take 1 tablet (100 mg total) by mouth daily. 11/03/19   Larey Dresser, MD  Multiple Vitamin (MULTIVITAMIN WITH MINERALS) TABS tablet Take 1 tablet by mouth daily. Senior Multivitamin Iron Free Formula    [provider]  spironolactone (ALDACTONE) 25 MG tablet TAKE 1 TABLET BY MOUTH EVERY DAY 12/18/19   Larey Dresser, MD  vitamin B-12 (CYANOCOBALAMIN) 1000 MCG tablet Take 1,000 mcg by mouth daily.    [provider]  zafirlukast (ACCOLATE) 20 MG tablet TAKE 1 TABLET (20 MG TOTAL) BY MOUTH 2 (TWO) TIMES DAILY. 07/17/19   Susy Frizzle, MD    Allergies    Adhesive [tape], Diphenhydramine, and Vicodin [hydrocodone-acetaminophen]  Review of Systems   Review of Systems  All other systems reviewed and are negative.   Physical Exam Updated Vital Signs BP (!) 94/55 (BP Location: Left Arm)   Pulse 78   Temp 98 F (36.7 C) (Oral)   Resp 14   Ht 1.575 m (5' 2" )   Wt 78 kg   SpO2 96%   BMI 31.46 kg/m   Physical Exam Vitals and nursing note reviewed.  Constitutional:      General: She is not in acute distress.    Appearance: Normal appearance. She is well-developed. She  is not toxic-appearing.  HENT:     Head: Normocephalic and atraumatic.  Eyes:     General: Lids are normal.     Conjunctiva/sclera: Conjunctivae normal.     Pupils: Pupils are equal, round, and reactive to light.  Neck:     Thyroid: No thyroid mass.     Trachea: No tracheal deviation.  Cardiovascular:     Rate and Rhythm: Normal rate and regular rhythm.     Heart sounds: Normal heart sounds. No murmur. No gallop.   Pulmonary:     Effort: Pulmonary effort is normal. No respiratory distress.     Breath sounds: Normal breath sounds. No stridor. No decreased breath sounds, wheezing, rhonchi or rales.  Abdominal:     General: Bowel sounds are normal. There is no distension.     Palpations: Abdomen is soft.     Tenderness: There is no abdominal tenderness. There is left CVA tenderness. There is no guarding or rebound.  Musculoskeletal:        General: No tenderness. Normal range of motion.     Cervical back: Normal range of motion and neck supple.  Skin:    General: Skin is warm and dry.     Findings: No abrasion or rash.  Neurological:     Mental Status: She is alert and oriented to person, place, and time.     GCS: GCS eye subscore is 4. GCS verbal subscore is 5. GCS motor subscore is 6.     Cranial Nerves: No cranial nerve deficit.     Sensory: No sensory deficit.  Psychiatric:        Speech: Speech normal.  Behavior: Behavior normal.     ED Results / Procedures / Treatments   Labs (all labs ordered are listed, but only abnormal results are displayed) Labs Reviewed  URINALYSIS, ROUTINE W REFLEX MICROSCOPIC    EKG None  Radiology No results found.  Procedures Procedures (including critical care time)  Medications Ordered in ED Medications - No data to display  ED Course  I have reviewed the triage vital signs and the nursing notes.  Pertinent labs & imaging results that were available during my care of the patient were reviewed by me and considered in my  medical decision making (see chart for details).    MDM Rules/Calculators/A&P                      Patient is urinalysis, chest x-ray, CT negative.  Patient does endorse nail upon further questioning she did do increased activity.  Given Ultram here and feels better will discharge home Final Clinical Impression(s) / ED Diagnoses Final diagnoses:  None    Rx / DC Orders ED Discharge Orders    None       Lacretia Leigh, MD 01/13/20 1455

## 2020-01-13 NOTE — ED Notes (Signed)
Pt verbalizes understanding of d/c instructions. Pt taken to lobby via wheelchair at d/c with all belongings and with family.   

## 2020-01-15 ENCOUNTER — Other Ambulatory Visit (HOSPITAL_COMMUNITY): Payer: Medicare Other

## 2020-01-15 ENCOUNTER — Ambulatory Visit (HOSPITAL_COMMUNITY): Payer: Medicare Other | Admitting: Physician Assistant

## 2020-01-22 ENCOUNTER — Ambulatory Visit (HOSPITAL_COMMUNITY)
Admission: RE | Admit: 2020-01-22 | Discharge: 2020-01-22 | Disposition: A | Discharge status: Home / Self Care | Payer: Medicare Other | Source: Ambulatory Visit | Attending: Physician Assistant | Admitting: Physician Assistant

## 2020-01-22 ENCOUNTER — Other Ambulatory Visit: Payer: Self-pay

## 2020-01-22 ENCOUNTER — Encounter (HOSPITAL_COMMUNITY): Payer: Self-pay | Admitting: Physician Assistant

## 2020-01-22 ENCOUNTER — Ambulatory Visit (HOSPITAL_COMMUNITY): Payer: Medicare Other | Admitting: Physician Assistant

## 2020-01-22 VITALS — BP 110/62 | HR 69 | Ht 62.0 in | Wt 180.6 lb

## 2020-01-22 DIAGNOSIS — Z833 Family history of diabetes mellitus: Secondary | ICD-10-CM | POA: Insufficient documentation

## 2020-01-22 DIAGNOSIS — Z888 Allergy status to other drugs, medicaments and biological substances status: Secondary | ICD-10-CM | POA: Diagnosis not present

## 2020-01-22 DIAGNOSIS — I428 Other cardiomyopathies: Secondary | ICD-10-CM | POA: Diagnosis not present

## 2020-01-22 DIAGNOSIS — I5020 Unspecified systolic (congestive) heart failure: Secondary | ICD-10-CM | POA: Diagnosis not present

## 2020-01-22 DIAGNOSIS — I110 Hypertensive heart disease with heart failure: Secondary | ICD-10-CM | POA: Insufficient documentation

## 2020-01-22 DIAGNOSIS — Z853 Personal history of malignant neoplasm of breast: Secondary | ICD-10-CM | POA: Diagnosis not present

## 2020-01-22 DIAGNOSIS — Z885 Allergy status to narcotic agent status: Secondary | ICD-10-CM | POA: Diagnosis not present

## 2020-01-22 DIAGNOSIS — I447 Left bundle-branch block, unspecified: Secondary | ICD-10-CM | POA: Insufficient documentation

## 2020-01-22 DIAGNOSIS — I480 Paroxysmal atrial fibrillation: Secondary | ICD-10-CM | POA: Diagnosis not present

## 2020-01-22 DIAGNOSIS — E785 Hyperlipidemia, unspecified: Secondary | ICD-10-CM | POA: Diagnosis not present

## 2020-01-22 DIAGNOSIS — Z79899 Other long term (current) drug therapy: Secondary | ICD-10-CM | POA: Diagnosis not present

## 2020-01-22 DIAGNOSIS — E119 Type 2 diabetes mellitus without complications: Secondary | ICD-10-CM | POA: Diagnosis not present

## 2020-01-22 DIAGNOSIS — K219 Gastro-esophageal reflux disease without esophagitis: Secondary | ICD-10-CM | POA: Diagnosis not present

## 2020-01-22 DIAGNOSIS — Z7901 Long term (current) use of anticoagulants: Secondary | ICD-10-CM | POA: Insufficient documentation

## 2020-01-22 DIAGNOSIS — Z8249 Family history of ischemic heart disease and other diseases of the circulatory system: Secondary | ICD-10-CM | POA: Insufficient documentation

## 2020-01-22 DIAGNOSIS — Z86711 Personal history of pulmonary embolism: Secondary | ICD-10-CM | POA: Insufficient documentation

## 2020-01-22 DIAGNOSIS — Z9221 Personal history of antineoplastic chemotherapy: Secondary | ICD-10-CM | POA: Diagnosis not present

## 2020-01-22 DIAGNOSIS — E669 Obesity, unspecified: Secondary | ICD-10-CM | POA: Diagnosis not present

## 2020-01-22 DIAGNOSIS — Z6833 Body mass index (BMI) 33.0-33.9, adult: Secondary | ICD-10-CM | POA: Diagnosis not present

## 2020-01-22 DIAGNOSIS — D6869 Other thrombophilia: Secondary | ICD-10-CM | POA: Diagnosis not present

## 2020-01-22 LAB — BASIC METABOLIC PANEL
Anion gap: 11 (ref 5–15)
BUN: 22 mg/dL (ref 8–23)
CO2: 20 mmol/L — ABNORMAL LOW (ref 22–32)
Calcium: 8.8 mg/dL — ABNORMAL LOW (ref 8.9–10.3)
Chloride: 108 mmol/L (ref 98–111)
Creatinine, Ser: 0.93 mg/dL (ref 0.44–1.00)
GFR calc Af Amer: >60 mL/min (ref >60)
GFR calc non Af Amer: >60 mL/min (ref >60)
Glucose, Bld: 113 mg/dL — ABNORMAL HIGH (ref 70–99)
Potassium: 4.4 mmol/L (ref 3.5–5.1)
Sodium: 139 mmol/L (ref 135–145)

## 2020-01-22 NOTE — Progress Notes (Signed)
Primary Care Physician: Susy Frizzle, MD Primary Cardiologist: Dr Aundra Dubin Primary Electrophysiologist: none Referring Physician: Dr Milon Score [name] is a 74 y.o. female with a history of HTN, DM, systolic CHF, asthma, HLD, and paroxysmal atrial fibrillation who presents for follow up in the [facility].  The patient was initially diagnosed with atrial fibrillation 12/2014 after presenting with symptoms of increased dyspnea. Her Jodelle Red showed atrial fibrillation. Patient is on Eliquis for a CHADS2VASC score of 6. On 12/21/19, patient reported that she was "feeling funny" and checked her Jodelle Red which showed rapid afib. She took an extra dose of Coreg but her rapid rates persistent and she presented to the ER. She spontaneously converted in the ER. There were no specific triggers that she could identify. She denies snoring or alcohol use.  On follow up today, patient reports she has done well since her last visit. She did go to the ER for back pain and was in SR. Her Kardia device has shown SR. She denies any bleeding issues on anticoagulation.   Today, she denies symptoms of palpitations, chest pain, shortness of breath, orthopnea, PND, lower extremity edema, dizziness, presyncope, syncope, snoring, daytime somnolence, bleeding, or neurologic sequela. The patient is tolerating medications without difficulties and is otherwise without complaint today.    Atrial Fibrillation Risk Factors:  she does not have symptoms or diagnosis of sleep apnea. she does not have a history of rheumatic fever. she does not have a history of alcohol use. The patient does not have a history of early familial atrial fibrillation or other arrhythmias.  she has a BMI of Body mass index is 33.03 kg/m.Marland Kitchen Filed Weights   01/22/20 1053  Weight: 81.9 kg    Family History  Problem Relation Age of Onset  . Asthma Maternal Grandmother   . Breast cancer Maternal Grandmother    dx. 19s  . Stomach cancer Maternal Grandmother 60       lots of stomach problems  . Heart attack Paternal Grandfather   . Other Daughter        cyst on ovary; unilateral oophorectomy  . Heart attack Brother   . Cancer Maternal Aunt        unknown type  . Celiac disease Maternal Aunt   . Diabetes Maternal Uncle   . Colon cancer Cousin        dx. 50s-60s  . Colon cancer Paternal Aunt        dx. late 90s  . Lung cancer Paternal Aunt        dx. late 60s-70s; former smoker  . Heart attack Paternal Aunt   . Heart attack Paternal Uncle      Atrial Fibrillation Management history:  Previous antiarrhythmic drugs: none Previous cardioversions: none Previous ablations: none CHADS2VASC score: 6 Anticoagulation history: Eliquis   Past Medical History:  Diagnosis Date  . Allergy history unknown   . Arthritis    oa  . Asthma    takes acolate for  . Breast cancer ([place]) 1999; 2008   hx of bilateral breast cancer  . Breast cancer, left breast ([place]) 10/27/2012   3.2 cm ER positive, Her-2 negative, 1 node positive S/P mastectomy/TTRAM reconstruction 4/08  . Breast cancer, right breast ([place]) 10/27/2012   3.2 cm ER/PR positive, 1 node positive, Her-2 negative Rx w mastectomy, AC -T chemo, followed by aromatase inhibitor x 5 years dx 4/08  . Cancer [facility])    breast CA  . Cataracts,  bilateral   . Dehydration after exertion    after she went berry picking in the summer heat . see ED visit   . Diabetes ([place])   . DM2 (diabetes mellitus, type 2) ([place])    on welchol for  . Dyspnea    with exertion  . Elevated cholesterol   . GERD (gastroesophageal reflux disease)   . Hiatal hernia   . Hypertension   . LBBB (left bundle branch block) 10/27/2012  . Leg cramps   . NICM (nonischemic cardiomyopathy) ([place])    a. Echo:  06/14/12: Poor endocardial definition, possible septal HK, EF 50-55%, normal wall motion, mild LAE ;  b.  [facility] 9/13:  normal cors, EF 35-40%,  c. Echo 7/14: EF 50-55%, mild LAE  .  Other fatigue 01/01/2015  . PONV (postoperative nausea and vomiting)   . Pulmonary embolus ([place]) 2001   after tram flap surgery   Past Surgical History:  Procedure Laterality Date  . DILATION AND CURETTAGE OF UTERUS   40yr ago   x 2  . KNEE CLOSED REDUCTION Right 06/01/2018   Procedure: CLOSED MANIPULATION RIGHT KNEE;  Surgeon: AGaynelle Arabian MD;  [facility]: [facility];  Service: Orthopedics;  Laterality: Right;  . KNEE CLOSED REDUCTION Left 02/22/2019   Procedure: CLOSED MANIPULATION KNEE;  Surgeon: AGaynelle Arabian MD;  [facility];  Service: Orthopedics;  Laterality: Left;  116m  . KNEE SURGERY  2006   left torn cartilage repair  . LEFT HEART CATHETERIZATION WITH CORONARY ANGIOGRAM N/A 06/15/2012   Procedure: LEFT HEART CATHETERIZATION WITH CORONARY ANGIOGRAM;  Surgeon: MuWellington HampshireMD;  [facility];  Service: Cardiovascular;  Laterality: N/A;  . MASTECTOMY     bilateral  . RECONSTRUCTION BREAST W/ TRAM FLAP Left 2001  . TOTAL KNEE ARTHROPLASTY Right 03/07/2018   Procedure: RIGHT TOTAL KNEE ARTHROPLASTY;  Surgeon: AlGaynelle ArabianMD;  [facility];  Service: Orthopedics;  Laterality: Right;  . TOTAL KNEE ARTHROPLASTY Left 10/31/2018   Procedure: TOTAL KNEE ARTHROPLASTY;  Surgeon: AlGaynelle ArabianMD;  [facility];  Service: Orthopedics;  Laterality: Left;  5034m   Current Outpatient Medications  Medication Sig Dispense Refill  . acetaminophen (TYLENOL) 500 MG tablet Take 1,000 mg by mouth every 6 (six) hours as needed for mild pain or moderate pain.     . Ascorbic Acid (VITAMIN C WITH ROSE HIPS) 500 MG tablet Take 500 mg by mouth daily.    . aMarland Kitchenorvastatin (LIPITOR) 20 MG tablet TAKE 2 TABLETS (40 MG TOTAL) BY MOUTH EVERY EVENING. 180 tablet 2  . Blood Glucose Monitoring Suppl (ACCU-CHEK AVIVA PLUS) w/Device KIT Use to check FBS QAM - DX:E11.9 1 kit 1  . Blood Glucose Monitoring Suppl (ONE TOUCH ULTRA 2) W/DEVICE KIT Check FBS qam - Dx:250.00 and she will need  lancets (1 box) , strips (1 bottle = 100) and controls also thanks 1 each 0  . carvedilol (COREG) 12.5 MG tablet TAKE 1 TABLET IN THE MORNING AND 1.5 TABLETS IN THE EVENING 225 tablet 3  . colesevelam (WELCHOL) 625 MG tablet TAKE 3 TABLETS BY MOUTH TWICE A DAY WITH A MEAL 540 tablet 3  . DEXILANT 60 MG capsule TAKE ONE CAPSULE BY MOUTH EVERY DAY 90 capsule 3  . ELIQUIS 5 MG TABS tablet TAKE 1 TABLET BY MOUTH TWICE A DAY 180 tablet 3  . fluticasone (FLONASE) 50 MCG/ACT nasal spray USE 2 SPRAYS IN EACH NOSTRIL DAILY FOR 3 MONTHS 48 g 3  .  Ginkgo Biloba 120 MG TABS Take 120 mg by mouth daily.    . Glucosamine-Chondroitin (COSAMIN DS PO) Take 1 tablet by mouth 2 (two) times a day.    Marland Kitchen glucose blood test strip Use to check BS QAM DX: E11.9 100 each 3  . Lancets (ACCU-CHEK SOFT TOUCH) lancets Use to check BS QAM DX: E11.9 100 each 3  . losartan (COZAAR) 100 MG tablet Take 1 tablet (100 mg total) by mouth daily. 90 tablet 3  . Multiple Vitamin (MULTIVITAMIN WITH MINERALS) TABS tablet Take 1 tablet by mouth daily. Senior Multivitamin Iron Free Formula    . spironolactone (ALDACTONE) 25 MG tablet TAKE 1 TABLET BY MOUTH EVERY DAY 90 tablet 3  . vitamin B-12 (CYANOCOBALAMIN) 1000 MCG tablet Take 1,000 mcg by mouth daily.    Marland Kitchen VITAMIN D, CHOLECALCIFEROL, PO Take by mouth.    . zafirlukast (ACCOLATE) 20 MG tablet TAKE 1 TABLET (20 MG TOTAL) BY MOUTH 2 (TWO) TIMES DAILY. 180 tablet 4   No current facility-administered medications for this encounter.    Allergies  Allergen Reactions  . Adhesive [Tape] Other (See Comments)    Skin turns red   . Diphenhydramine Other (See Comments)    Pt feels wired, hyperactive  . Vicodin [Hydrocodone-Acetaminophen] Nausea And Vomiting    Social History   Socioeconomic History  . Marital status: Married    Spouse name: Not on file  . Number of children: 2  . Years of education: Not on file  . Highest education level: Not on file  Occupational History  .  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 Smoking status: Never Smoker  . Smokeless tobacco: Never Used  Substance and Sexual Activity  . Alcohol use: No  . Drug use: No  . Sexual activity: Never  Other Topics Concern  . Not on file  Social History Narrative  . Not on file   Social Determinants of Health   Financial Resource Strain:   . Difficulty of Paying Living Expenses:   Food Insecurity:   . Worried About Charity fundraiser in the Last Year:   . Arboriculturist in the Last Year:   Transportation Needs:   . Film/video editor (Medical):   Marland Kitchen Lack of Transportation (Non-Medical):   Physical Activity:   . Days of Exercise per Week:   . Minutes of Exercise per Session:   Stress:   . Feeling of Stress :   Social Connections:   . Frequency of Communication with Friends and Family:   . Frequency of Social Gatherings with Friends and Family:   . Attends Religious Services:   . Active Member of Clubs or Organizations:   . Attends Archivist Meetings:   Marland Kitchen Marital Status:   Intimate Partner Violence:   . Fear of Current or Ex-Partner:   . Emotionally Abused:   Marland Kitchen Physically Abused:   . Sexually Abused:      ROS- All systems are reviewed and negative except as per the HPI above.  Physical Exam: Vitals:   01/22/20 1053  BP: 110/62  Pulse: 69  Weight: 81.9 kg  Height: 5' 2"  (1.575 m)    GEN- The patient is well appearing obese elderly female, alert and oriented x 3 today.   HEENT-head normocephalic, atraumatic, sclera clear, conjunctiva pink, hearing intact, trachea midline. Lungs- Clear to ausculation bilaterally, normal work of breathing Heart- Regular rate and  rhythm, no murmurs, rubs or gallops  GI- soft, NT, ND, + BS Extremities- no clubbing, cyanosis, or edema MS- no significant deformity or atrophy Skin- no rash or lesion Psych- euthymic mood, full affect Neuro- strength and sensation are  intact   Wt Readings from Last 3 Encounters:  01/22/20 81.9 kg  01/13/20 78 kg  12/22/19 82.9 kg    EKG today demonstrates SR HR 69, LBBB, PR 194, QRS 152, QTc 452  Echo 10/31/19 demonstrated  1. Left ventricular ejection fraction, by visual estimation, is 30 to  35%. The left ventricle has moderately decreased function. There is no  left ventricular hypertrophy.  2. Left ventricular diastolic parameters are consistent with Grade I  diastolic dysfunction (impaired relaxation).  3. The left ventricle demonstrates global hypokinesis.  4. Global right ventricle has normal systolic function.The right  ventricular size is normal. No increase in right ventricular wall  thickness.  5. Left atrial size was normal.  6. Right atrial size was normal.  7. The mitral valve is normal in structure. Trivial mitral valve  regurgitation. No evidence of mitral stenosis.  8. The tricuspid valve is normal in structure.  9. The tricuspid valve is normal in structure. Tricuspid valve  regurgitation is trivial.  10. The aortic valve is normal in structure. Aortic valve regurgitation is  not visualized. No evidence of aortic valve sclerosis or stenosis.  11. The pulmonic valve was grossly normal. Pulmonic valve regurgitation is  trivial.  12. The atrial septum is grossly normal.   Epic records are reviewed at length today  CHA2DS2-VASc Score = 6 The patient's score is based upon: CHF History: Yes HTN History: Yes Age : 22 + Diabetes History: Yes Stroke History: No Vascular Disease History: No Gender: Female   ASSESSMENT AND PLAN: 1. Paroxysmal Atrial Fibrillation (ICD10:  I48.0) The patient's CHA2DS2-VASc score is 6, indicating a 9.7% annual risk of stroke.   Patient appears to be maintaining SR, Kardia strips reviewed.  Continue Eliquis 5 mg BID. Continue Coreg 12.5 mg AM and 18.75 mg PM  2. Secondary Hypercoagulable State (ICD10:  D68.69) The patient is at significant risk for  stroke/thromboembolism based upon her CHA2DS2-VASc Score of 6.  Continue Apixaban (Eliquis).   3. Obesity Body mass index is 33.03 kg/m. Lifestyle modification was discussed and encouraged including regular physical activity and weight reduction.  4. HTN Stable, no changes today.  5. NICM No signs or symptoms of fluid overload. Followed by Dr Aundra Dubin. Bmet today.   Follow up with Dr Aundra Dubin per recall. AF clinic in 6 months.    [facility] [address] [place], [place] [address] [phone] 01/22/2020 11:22 AM

## 2020-01-29 DIAGNOSIS — H2511 Age-related nuclear cataract, right eye: Secondary | ICD-10-CM | POA: Diagnosis not present

## 2020-01-29 DIAGNOSIS — H25811 Combined forms of age-related cataract, right eye: Secondary | ICD-10-CM | POA: Diagnosis not present

## 2020-02-12 DIAGNOSIS — H25812 Combined forms of age-related cataract, left eye: Secondary | ICD-10-CM | POA: Diagnosis not present

## 2020-02-12 DIAGNOSIS — H2512 Age-related nuclear cataract, left eye: Secondary | ICD-10-CM | POA: Diagnosis not present

## 2020-02-26 ENCOUNTER — Telehealth (HOSPITAL_COMMUNITY): Payer: Self-pay | Admitting: *Deleted

## 2020-02-26 NOTE — Telephone Encounter (Signed)
Reaching out to patient to offer assistance regarding upcoming cardiac imaging study; pt verbalizes understanding of appt date/time, parking situation and where to check in, and verified current allergies; name and call back number provided for further questions should they arise  Josalin Carneiro Tai RN Navigator Cardiac Imaging [facility] [phone] office [phone] cell 

## 2020-02-27 ENCOUNTER — Ambulatory Visit (HOSPITAL_COMMUNITY)
Admission: RE | Admit: 2020-02-27 | Discharge: 2020-02-27 | Disposition: A | Discharge status: Home / Self Care | Payer: Medicare Other | Source: Ambulatory Visit | Attending: Cardiology | Admitting: Cardiology

## 2020-02-27 ENCOUNTER — Other Ambulatory Visit: Payer: Self-pay

## 2020-02-27 DIAGNOSIS — I428 Other cardiomyopathies: Secondary | ICD-10-CM | POA: Diagnosis not present

## 2020-02-27 MED ORDER — GADOBUTROL 1 MMOL/ML IV SOLN
10.0000 mL | Freq: Once | INTRAVENOUS | Status: AC | PRN
  Administered 2020-02-27: 10 mL via INTRAVENOUS

## 2020-04-03 ENCOUNTER — Other Ambulatory Visit: Payer: Self-pay | Admitting: Family Medicine

## 2020-05-23 ENCOUNTER — Ambulatory Visit (INDEPENDENT_AMBULATORY_CARE_PROVIDER_SITE_OTHER): Payer: Medicare Other | Admitting: Family Medicine

## 2020-05-23 ENCOUNTER — Other Ambulatory Visit: Payer: Self-pay

## 2020-05-23 VITALS — BP 110/60 | HR 75 | Temp 96.5°F | Ht 62.0 in | Wt 180.0 lb

## 2020-05-23 DIAGNOSIS — I428 Other cardiomyopathies: Secondary | ICD-10-CM

## 2020-05-23 DIAGNOSIS — I480 Paroxysmal atrial fibrillation: Secondary | ICD-10-CM | POA: Diagnosis not present

## 2020-05-23 DIAGNOSIS — E118 Type 2 diabetes mellitus with unspecified complications: Secondary | ICD-10-CM | POA: Diagnosis not present

## 2020-05-23 NOTE — Progress Notes (Signed)
Subjective:    Patient ID: [name], female    DOB: May 08, 1946, 74 y.o.   MRN: [id number]  HPI  Patient has a longstanding history of diabetes mellitus.  She is here today to recheck her A1c.  She denies any polyuria, polydipsia, blurry vision.  She denies any chest pain shortness of breath or dyspnea on exertion that is new.  She is no longer taking her Lasix.  She is still taking spironolactone.  She denies any cramps.  She does have some numbness in her feet.  She denies any burning paresthesias in the feet.  Blood pressure today is excellent at 110/60 Past Medical History:  Diagnosis Date  . Allergy history unknown   . Arthritis    oa  . Asthma    takes acolate for  . Breast cancer ([place]) 1999; 2008   hx of bilateral breast cancer  . Breast cancer, left breast ([place]) 10/27/2012   3.2 cm ER positive, Her-2 negative, 1 node positive S/P mastectomy/TTRAM reconstruction 4/08  . Breast cancer, right breast ([place]) 10/27/2012   3.2 cm ER/PR positive, 1 node positive, Her-2 negative Rx w mastectomy, AC -T chemo, followed by aromatase inhibitor x 5 years dx 4/08  . Cancer [facility])    breast CA  . Cataracts, bilateral   . Dehydration after exertion    after she went berry picking in the summer heat . see ED visit   . Diabetes ([place])   . DM2 (diabetes mellitus, type 2) ([place])    on welchol for  . Dyspnea    with exertion  . Elevated cholesterol   . GERD (gastroesophageal reflux disease)   . Hiatal hernia   . Hypertension   . LBBB (left bundle branch block) 10/27/2012  . Leg cramps   . NICM (nonischemic cardiomyopathy) ([place])    a. Echo:  06/14/12: Poor endocardial definition, possible septal HK, EF 50-55%, normal wall motion, mild LAE ;  b.  [facility] 9/13:  normal cors, EF 35-40%,  c. Echo 7/14: EF 50-55%, mild LAE  . Other fatigue 01/01/2015  . PONV (postoperative nausea and vomiting)   . Pulmonary embolus ([place]) 2001   after tram flap surgery   Past Surgical History:  Procedure Laterality  Date  . DILATION AND CURETTAGE OF UTERUS   48yr ago   x 2  . KNEE CLOSED REDUCTION Right 06/01/2018   Procedure: CLOSED MANIPULATION RIGHT KNEE;  Surgeon: AGaynelle Arabian MD;  [facility];  Service: Orthopedics;  Laterality: Right;  . KNEE CLOSED REDUCTION Left 02/22/2019   Procedure: CLOSED MANIPULATION KNEE;  Surgeon: AGaynelle Arabian MD;  [facility];  Service: Orthopedics;  Laterality: Left;  127m  . KNEE SURGERY  2006   left torn cartilage repair  . LEFT HEART CATHETERIZATION WITH CORONARY ANGIOGRAM N/A 06/15/2012   Procedure: LEFT HEART CATHETERIZATION WITH CORONARY ANGIOGRAM;  Surgeon: MuWellington HampshireMD;  [facility];  Service: Cardiovascular;  Laterality: N/A;  . MASTECTOMY     bilateral  . RECONSTRUCTION BREAST W/ TRAM FLAP Left 2001  . TOTAL KNEE ARTHROPLASTY Right 03/07/2018   Procedure: RIGHT TOTAL KNEE ARTHROPLASTY;  Surgeon: AlGaynelle ArabianMD;  [facility];  Service: Orthopedics;  Laterality: Right;  . TOTAL KNEE ARTHROPLASTY Left 10/31/2018   Procedure: TOTAL KNEE ARTHROPLASTY;  Surgeon: AlGaynelle ArabianMD;  [facility];  Service: Orthopedics;  Laterality: Left;  5018m  Current Outpatient Medications on File Prior to Visit  Medication Sig Dispense Refill  .  acetaminophen (TYLENOL) 500 MG tablet Take 1,000 mg by mouth every 6 (six) hours as needed for mild pain or moderate pain.     . Ascorbic Acid (VITAMIN C WITH ROSE HIPS) 500 MG tablet Take 500 mg by mouth daily.    Marland Kitchen atorvastatin (LIPITOR) 20 MG tablet TAKE 2 TABLETS (40 MG TOTAL) BY MOUTH EVERY EVENING. 180 tablet 2  . Blood Glucose Monitoring Suppl (ACCU-CHEK AVIVA PLUS) w/Device KIT Use to check FBS QAM - DX:E11.9 1 kit 1  . Blood Glucose Monitoring Suppl (ONE TOUCH ULTRA 2) W/DEVICE KIT Check FBS qam - Dx:250.00 and she will need lancets (1 box) , strips (1 bottle = 100) and controls also thanks 1 each 0  . carvedilol (COREG) 12.5 MG tablet TAKE 1 TABLET IN THE MORNING AND 1.5 TABLETS IN  THE EVENING 225 tablet 3  . colesevelam (WELCHOL) 625 MG tablet TAAKE 3 TABLETS BY MOUTH TWICE A DAY WITH A MEAL 540 tablet 3  . DEXILANT 60 MG capsule TAKE ONE CAPSULE BY MOUTH EVERY DAY 90 capsule 3  . ELIQUIS 5 MG TABS tablet TAKE 1 TABLET BY MOUTH TWICE A DAY 180 tablet 3  . fluticasone (FLONASE) 50 MCG/ACT nasal spray USE 2 SPRAYS IN EACH NOSTRIL DAILY FOR 3 MONTHS 48 g 3  . Ginkgo Biloba 120 MG TABS Take 120 mg by mouth daily.    . Glucosamine-Chondroitin (COSAMIN DS PO) Take 1 tablet by mouth 2 (two) times a day.    Marland Kitchen glucose blood test strip Use to check BS QAM DX: E11.9 100 each 3  . Lancets (ACCU-CHEK SOFT TOUCH) lancets Use to check BS QAM DX: E11.9 100 each 3  . losartan (COZAAR) 100 MG tablet Take 1 tablet (100 mg total) by mouth daily. 90 tablet 3  . Multiple Vitamin (MULTIVITAMIN WITH MINERALS) TABS tablet Take 1 tablet by mouth daily. Senior Multivitamin Iron Free Formula    . spironolactone (ALDACTONE) 25 MG tablet TAKE 1 TABLET BY MOUTH EVERY DAY 90 tablet 3  . vitamin B-12 (CYANOCOBALAMIN) 1000 MCG tablet Take 1,000 mcg by mouth daily.    Marland Kitchen VITAMIN D, CHOLECALCIFEROL, PO Take by mouth.    . zafirlukast (ACCOLATE) 20 MG tablet TAKE 1 TABLET (20 MG TOTAL) BY MOUTH 2 (TWO) TIMES DAILY. 180 tablet 4   No current facility-administered medications on file prior to visit.   Allergies  Allergen Reactions  . Adhesive [Tape] Other (See Comments)    Skin turns red   . Diphenhydramine Other (See Comments)    Pt feels wired, hyperactive  . Vicodin [Hydrocodone-Acetaminophen] Nausea And Vomiting   Social History   Socioeconomic History  . Marital status: Married    Spouse name: Not on file  . Number of children: 2  . Years of education: Not on file  . Highest education level: Not on file  Occupational History  . 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 Smoking status: Never Smoker  . Smokeless tobacco: Never Used    Vaping Use  . Vaping Use: Never used  Substance and Sexual Activity  . Alcohol use: No  . Drug use: No  . Sexual activity: Never  Other Topics Concern  . Not on file  Social History Narrative  . Not on file   Social Determinants of Health   Financial Resource Strain:   . Difficulty of Paying Living Expenses: Not on file  Food Insecurity:   .  Worried About Charity fundraiser in the Last Year: Not on file  . Ran Out of Food in the Last Year: Not on file  Transportation Needs:   . Lack of Transportation (Medical): Not on file  . Lack of Transportation (Non-Medical): Not on file  Physical Activity:   . Days of Exercise per Week: Not on file  . Minutes of Exercise per Session: Not on file  Stress:   . Feeling of Stress : Not on file  Social Connections:   . Frequency of Communication with Friends and Family: Not on file  . Frequency of Social Gatherings with Friends and Family: Not on file  . Attends Religious Services: Not on file  . Active Member of Clubs or Organizations: Not on file  . Attends Archivist Meetings: Not on file  . Marital Status: Not on file  Intimate Partner Violence:   . Fear of Current or Ex-Partner: Not on file  . Emotionally Abused: Not on file  . Physically Abused: Not on file  . Sexually Abused: Not on file      Review of Systems  All other systems reviewed and are negative.      Objective:   Physical Exam Vitals reviewed.  Constitutional:      General: She is not in acute distress.    Appearance: She is well-developed. She is not diaphoretic.  Neck:     Vascular: No carotid bruit.  Cardiovascular:     Rate and Rhythm: Normal rate and regular rhythm.     Heart sounds: Normal heart sounds. No murmur heard.  No friction rub. No gallop.   Pulmonary:     Effort: Pulmonary effort is normal. No respiratory distress.     Breath sounds: Normal breath sounds. No stridor. No wheezing or rales.  Abdominal:     General: Abdomen is  flat. Bowel sounds are normal. There is no distension.     Palpations: Abdomen is soft.  Musculoskeletal:     Right lower leg: No edema.     Left lower leg: No edema.  Neurological:     General: No focal deficit present.     Mental Status: She is oriented to person, place, and time. Mental status is at baseline.           Assessment & Plan:  Controlled type 2 diabetes mellitus with complication, without long-term current use of insulin ([facility]) - Plan: CBC with Differential/Platelet, COMPLETE METABOLIC PANEL WITH GFR, Lipid panel, Hemoglobin A1c  Paroxysmal atrial fibrillation ([facility])  Nonischemic cardiomyopathy ([place])  Diabetic foot exam was performed today and is normal.  Blood pressure is excellent.  Check CBC, CMP, fasting lipid panel.  Goal LDL cholesterol is less than 100.  Goal hemoglobin A1c is less than 6.5.  Check potassium and renal function given the fact she is on spironolactone.  Today the patient is in normal sinus rhythm.  There is no evidence of fluid overload on exam.  She denies any orthopnea or paroxysmal nocturnal dyspnea.

## 2020-05-24 LAB — COMPLETE METABOLIC PANEL WITH GFR
AG Ratio: 1.6 (calc) (ref 1.0–2.5)
ALT: 12 U/L (ref 6–29)
AST: 14 U/L (ref 10–35)
Albumin: 4.2 g/dL (ref 3.6–5.1)
Alkaline phosphatase (APISO): 88 U/L (ref 37–153)
BUN: 24 mg/dL (ref 7–25)
CO2: 25 mmol/L (ref 20–32)
Calcium: 9.3 mg/dL (ref 8.6–10.4)
Chloride: 106 mmol/L (ref 98–110)
Creat: 0.91 mg/dL (ref 0.60–0.93)
GFR, Est African American: 71 mL/min/{1.73_m2} (ref >=60)
GFR, Est Non African American: 61 mL/min/{1.73_m2} (ref >=60)
Globulin: 2.7 g/dL (calc) (ref 1.9–3.7)
Glucose, Bld: 114 mg/dL — ABNORMAL HIGH (ref 65–99)
Potassium: 4.8 mmol/L (ref 3.5–5.3)
Sodium: 138 mmol/L (ref 135–146)
Total Bilirubin: 0.3 mg/dL (ref 0.2–1.2)
Total Protein: 6.9 g/dL (ref 6.1–8.1)

## 2020-05-24 LAB — HEMOGLOBIN A1C
Hgb A1c MFr Bld: 6.8 % of total Hgb — ABNORMAL HIGH (ref <5.7)
Mean Plasma Glucose: 148 (calc)
eAG (mmol/L): 8.2 (calc)

## 2020-05-24 LAB — CBC WITH DIFFERENTIAL/PLATELET
Absolute Monocytes: 481 cells/uL (ref 200–950)
Basophils Absolute: 32 cells/uL (ref 0–200)
Basophils Relative: 0.6 %
Eosinophils Absolute: 151 cells/uL (ref 15–500)
Eosinophils Relative: 2.8 %
HCT: 32.9 % — ABNORMAL LOW (ref 35.0–45.0)
Hemoglobin: 10.6 g/dL — ABNORMAL LOW (ref 11.7–15.5)
Lymphs Abs: 1615 cells/uL (ref 850–3900)
MCH: 30.3 pg (ref 27.0–33.0)
MCHC: 32.2 g/dL (ref 32.0–36.0)
MCV: 94 fL (ref 80.0–100.0)
MPV: 9.8 fL (ref 7.5–12.5)
Monocytes Relative: 8.9 %
Neutro Abs: 3121 cells/uL (ref 1500–7800)
Neutrophils Relative %: 57.8 %
Platelets: 301 10*3/uL (ref 140–400)
RBC: 3.5 10*6/uL — ABNORMAL LOW (ref 3.80–5.10)
RDW: 12.4 % (ref 11.0–15.0)
Total Lymphocyte: 29.9 %
WBC: 5.4 10*3/uL (ref 3.8–10.8)

## 2020-05-24 LAB — LIPID PANEL
Cholesterol: 101 mg/dL (ref <200)
HDL: 38 mg/dL — ABNORMAL LOW (ref >=50)
LDL Cholesterol (Calc): 42 mg/dL (calc)
Non-HDL Cholesterol (Calc): 63 mg/dL (calc) (ref <130)
Total CHOL/HDL Ratio: 2.7 (calc) (ref <5.0)
Triglycerides: 119 mg/dL (ref <150)

## 2020-05-29 ENCOUNTER — Encounter: Payer: Self-pay | Admitting: *Deleted

## 2020-05-31 ENCOUNTER — Other Ambulatory Visit: Payer: Self-pay | Admitting: *Deleted

## 2020-05-31 DIAGNOSIS — R6889 Other general symptoms and signs: Secondary | ICD-10-CM

## 2020-05-31 DIAGNOSIS — D649 Anemia, unspecified: Secondary | ICD-10-CM

## 2020-07-02 ENCOUNTER — Other Ambulatory Visit: Payer: Medicare Other

## 2020-07-02 ENCOUNTER — Other Ambulatory Visit: Payer: Self-pay

## 2020-07-02 DIAGNOSIS — D649 Anemia, unspecified: Secondary | ICD-10-CM | POA: Diagnosis not present

## 2020-07-02 DIAGNOSIS — R6889 Other general symptoms and signs: Secondary | ICD-10-CM

## 2020-07-03 LAB — VITAMIN B12: Vitamin B-12: >2000 pg/mL — ABNORMAL HIGH (ref 200–1100)

## 2020-07-03 LAB — IRON,TIBC AND FERRITIN PANEL
%SAT: 12 % (calc) — ABNORMAL LOW (ref 16–45)
Ferritin: 30 ng/mL (ref 16–288)
Iron: 46 ug/dL (ref 45–160)
TIBC: 373 mcg/dL (calc) (ref 250–450)

## 2020-07-05 DIAGNOSIS — Z471 Aftercare following joint replacement surgery: Secondary | ICD-10-CM | POA: Diagnosis not present

## 2020-07-05 DIAGNOSIS — Z96651 Presence of right artificial knee joint: Secondary | ICD-10-CM | POA: Diagnosis not present

## 2020-07-12 IMAGING — CR DG CHEST 2V
2 series · 2 of 2 positions shown · non-contrast
Comparison: CT chest 01/02/2015

CLINICAL DATA: Shortness of breath associated dehydration.

EXAM:
CHEST - 2 VIEW

[chest pa]
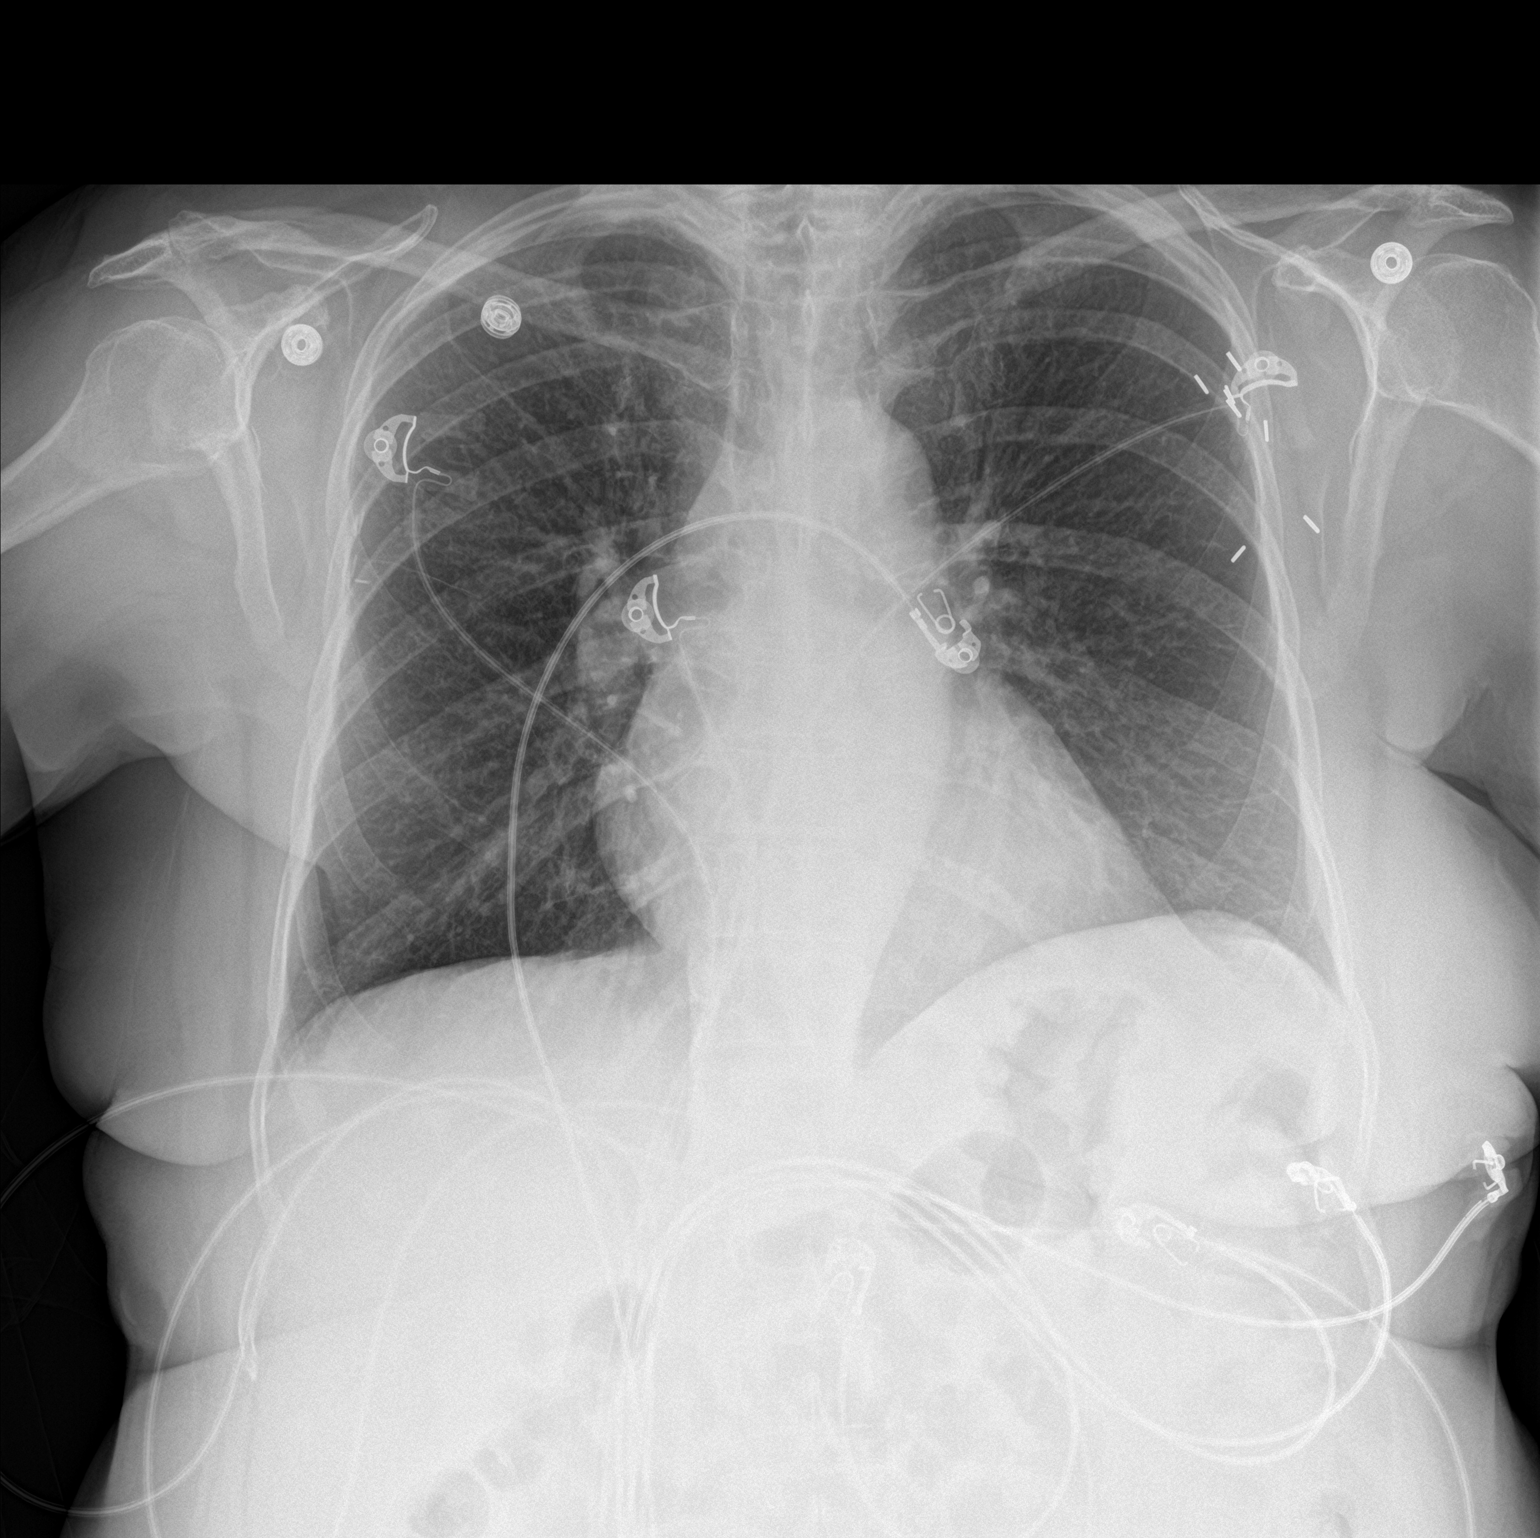

[chest lat]
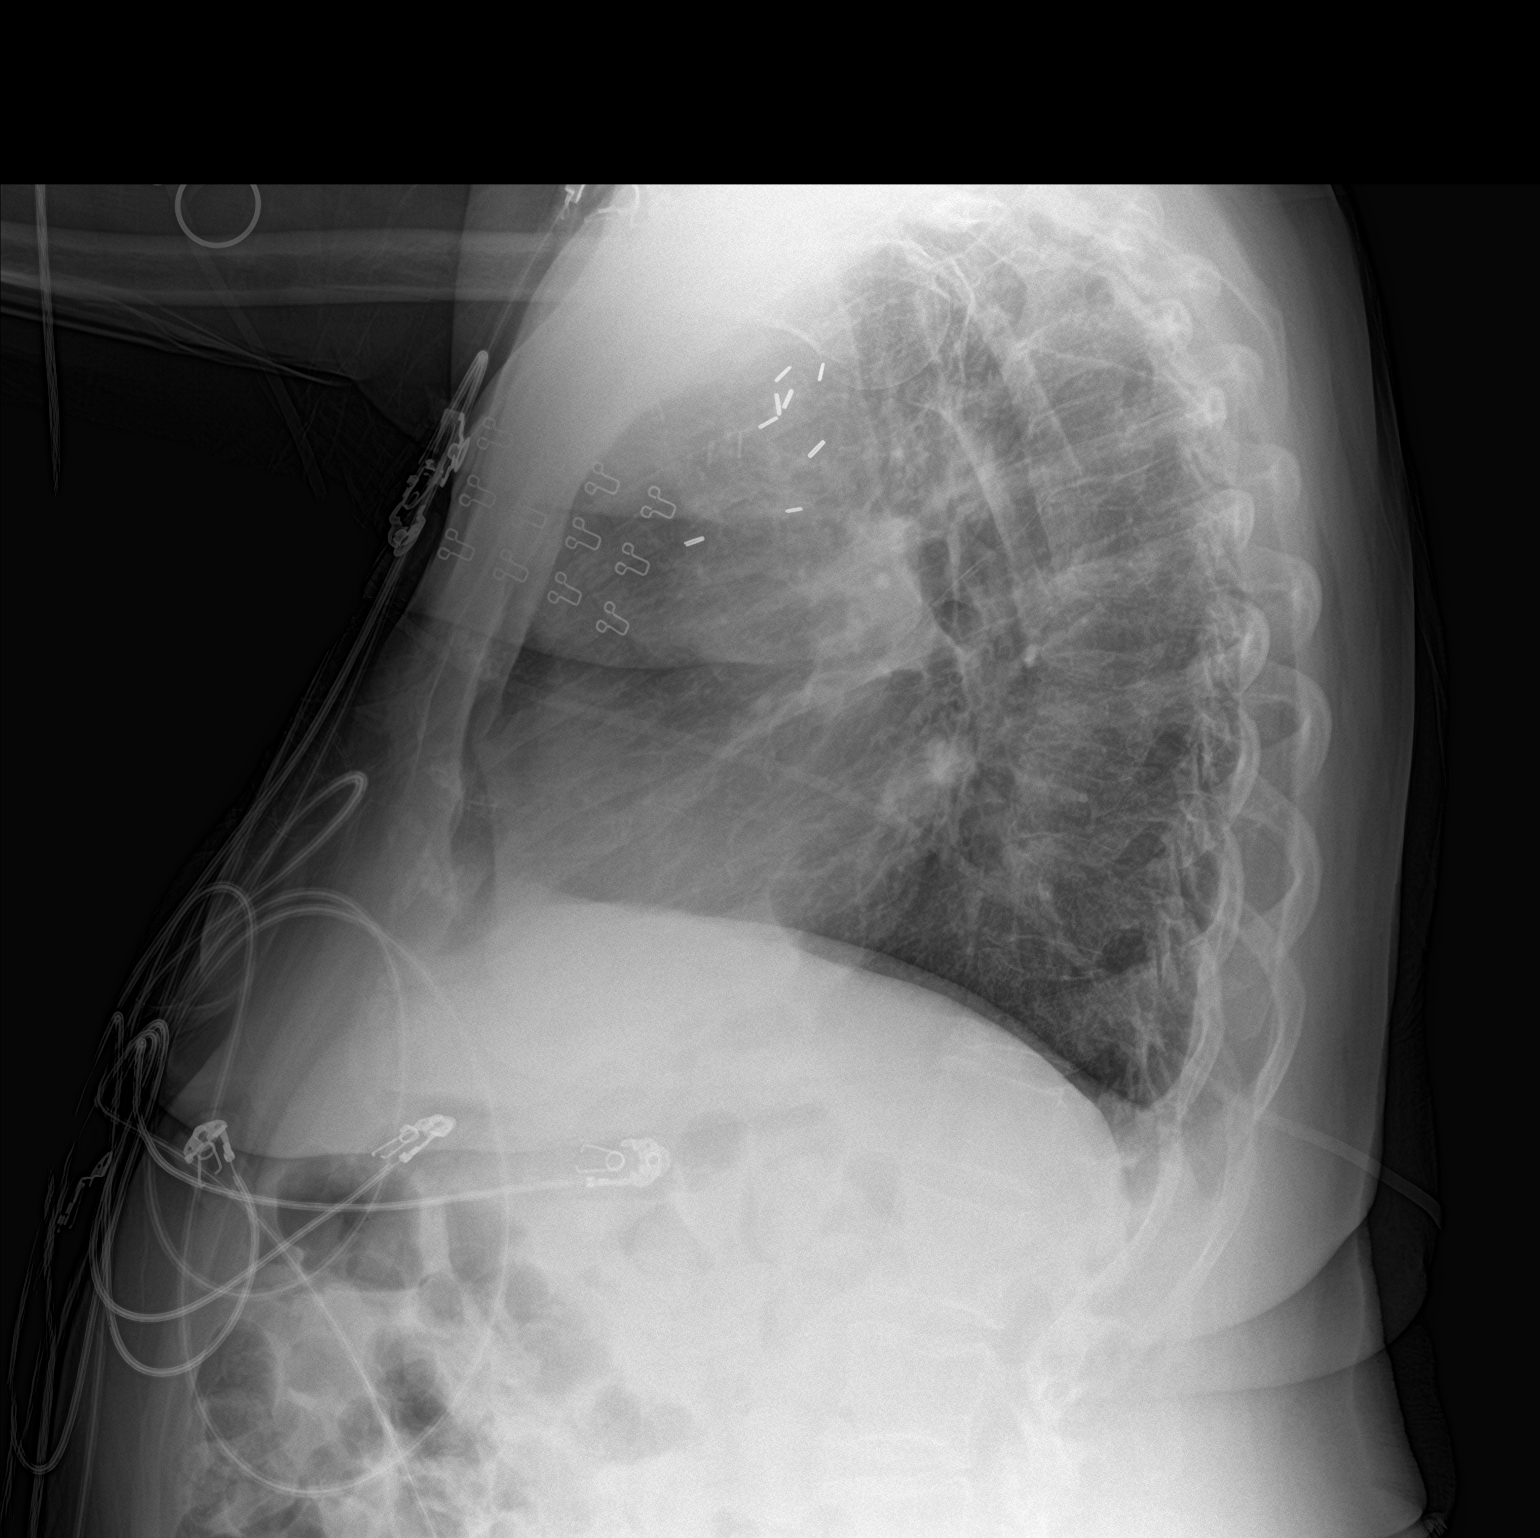

[2 of 2 positions shown; findings below may reference images not displayed]

FINDINGS: Heart size normal. Lungs are clear. There is no edema or effusion.
No focal airspace disease is present. Surgical clips are present in
the left axilla. The visualized soft tissues and bony thorax are
otherwise unremarkable.
IMPRESSION: No active cardiopulmonary disease.

## 2020-07-23 ENCOUNTER — Ambulatory Visit (HOSPITAL_COMMUNITY)
Admission: RE | Admit: 2020-07-23 | Discharge: 2020-07-23 | Disposition: A | Discharge status: Home / Self Care | Payer: Medicare Other | Source: Ambulatory Visit | Attending: Physician Assistant | Admitting: Physician Assistant

## 2020-07-23 ENCOUNTER — Other Ambulatory Visit: Payer: Self-pay

## 2020-07-23 VITALS — BP 108/62 | HR 68 | Ht 62.0 in | Wt 180.2 lb

## 2020-07-23 DIAGNOSIS — I5022 Chronic systolic (congestive) heart failure: Secondary | ICD-10-CM | POA: Diagnosis not present

## 2020-07-23 DIAGNOSIS — K219 Gastro-esophageal reflux disease without esophagitis: Secondary | ICD-10-CM | POA: Insufficient documentation

## 2020-07-23 DIAGNOSIS — I10 Essential (primary) hypertension: Secondary | ICD-10-CM | POA: Diagnosis not present

## 2020-07-23 DIAGNOSIS — I447 Left bundle-branch block, unspecified: Secondary | ICD-10-CM | POA: Diagnosis not present

## 2020-07-23 DIAGNOSIS — I480 Paroxysmal atrial fibrillation: Secondary | ICD-10-CM | POA: Diagnosis not present

## 2020-07-23 DIAGNOSIS — Z6832 Body mass index (BMI) 32.0-32.9, adult: Secondary | ICD-10-CM | POA: Diagnosis not present

## 2020-07-23 DIAGNOSIS — D6869 Other thrombophilia: Secondary | ICD-10-CM | POA: Diagnosis not present

## 2020-07-23 DIAGNOSIS — J45909 Unspecified asthma, uncomplicated: Secondary | ICD-10-CM | POA: Diagnosis not present

## 2020-07-23 DIAGNOSIS — Z853 Personal history of malignant neoplasm of breast: Secondary | ICD-10-CM | POA: Insufficient documentation

## 2020-07-23 DIAGNOSIS — Z79899 Other long term (current) drug therapy: Secondary | ICD-10-CM | POA: Insufficient documentation

## 2020-07-23 DIAGNOSIS — I110 Hypertensive heart disease with heart failure: Secondary | ICD-10-CM | POA: Insufficient documentation

## 2020-07-23 DIAGNOSIS — E785 Hyperlipidemia, unspecified: Secondary | ICD-10-CM | POA: Diagnosis not present

## 2020-07-23 DIAGNOSIS — E669 Obesity, unspecified: Secondary | ICD-10-CM | POA: Diagnosis not present

## 2020-07-23 DIAGNOSIS — Z7901 Long term (current) use of anticoagulants: Secondary | ICD-10-CM | POA: Diagnosis not present

## 2020-07-23 DIAGNOSIS — I428 Other cardiomyopathies: Secondary | ICD-10-CM | POA: Diagnosis not present

## 2020-07-23 DIAGNOSIS — E119 Type 2 diabetes mellitus without complications: Secondary | ICD-10-CM | POA: Insufficient documentation

## 2020-07-23 NOTE — Progress Notes (Signed)
Primary Care Physician: Susy Frizzle, MD Primary Cardiologist: Dr Aundra Dubin Primary Electrophysiologist: none Referring Physician: Dr Milon Score [name] is a 74 y.o. female with a history of HTN, DM, systolic CHF, asthma, HLD, and paroxysmal atrial fibrillation who presents for follow up in the [facility].  The patient was initially diagnosed with atrial fibrillation 12/2014 after presenting with symptoms of increased dyspnea. Her Jodelle Red showed atrial fibrillation. Patient is on Eliquis for a CHADS2VASC score of 6. On 12/21/19, patient reported that she was "feeling funny" and checked her Jodelle Red which showed rapid afib. She took an extra dose of Coreg but her rapid rates persistent and she presented to the ER. She spontaneously converted in the ER. There were no specific triggers that she could identify. She denies snoring or alcohol use.  On follow up today, patient reports she has done reasonably well since her last visit. She did have a 5 hour episode a few weeks ago which resolved with extra carvedilol. She denies any bleeding issues on anticoagulation.   Today, she denies symptoms of palpitations, chest pain, shortness of breath, orthopnea, PND, lower extremity edema, dizziness, presyncope, syncope, snoring, daytime somnolence, bleeding, or neurologic sequela. The patient is tolerating medications without difficulties and is otherwise without complaint today.    Atrial Fibrillation Risk Factors:  she does not have symptoms or diagnosis of sleep apnea. she does not have a history of rheumatic fever. she does not have a history of alcohol use. The patient does not have a history of early familial atrial fibrillation or other arrhythmias.  she has a BMI of Body mass index is 32.96 kg/m.Marland Kitchen Filed Weights   07/23/20 1036  Weight: 81.7 kg    Family History  Problem Relation Age of Onset  . Asthma Maternal Grandmother   . Breast cancer Maternal  Grandmother        dx. 57s  . Stomach cancer Maternal Grandmother 60       lots of stomach problems  . Heart attack Paternal Grandfather   . Other Daughter        cyst on ovary; unilateral oophorectomy  . Heart attack Brother   . Cancer Maternal Aunt        unknown type  . Celiac disease Maternal Aunt   . Diabetes Maternal Uncle   . Colon cancer Cousin        dx. 50s-60s  . Colon cancer Paternal Aunt        dx. late 87s  . Lung cancer Paternal Aunt        dx. late 60s-70s; former smoker  . Heart attack Paternal Aunt   . Heart attack Paternal Uncle      Atrial Fibrillation Management history:  Previous antiarrhythmic drugs: none Previous cardioversions: none Previous ablations: none CHADS2VASC score: 6 Anticoagulation history: Eliquis   Past Medical History:  Diagnosis Date  . Allergy history unknown   . Arthritis    oa  . Asthma    takes acolate for  . Breast cancer ([place]) 1999; 2008   hx of bilateral breast cancer  . Breast cancer, left breast ([place]) 10/27/2012   3.2 cm ER positive, Her-2 negative, 1 node positive S/P mastectomy/TTRAM reconstruction 4/08  . Breast cancer, right breast ([place]) 10/27/2012   3.2 cm ER/PR positive, 1 node positive, Her-2 negative Rx w mastectomy, AC -T chemo, followed by aromatase inhibitor x 5 years dx 4/08  . Cancer [facility])    breast CA  .  Cataracts, bilateral   . Dehydration after exertion    after she went berry picking in the summer heat . see ED visit   . Diabetes ([place])   . DM2 (diabetes mellitus, type 2) ([place])    on welchol for  . Dyspnea    with exertion  . Elevated cholesterol   . GERD (gastroesophageal reflux disease)   . Hiatal hernia   . Hypertension   . LBBB (left bundle branch block) 10/27/2012  . Leg cramps   . NICM (nonischemic cardiomyopathy) ([place])    a. Echo:  06/14/12: Poor endocardial definition, possible septal HK, EF 50-55%, normal wall motion, mild LAE ;  b.  [facility] 9/13:  normal cors, EF 35-40%,  c. Echo 7/14: EF  50-55%, mild LAE  . Other fatigue 01/01/2015  . PONV (postoperative nausea and vomiting)   . Pulmonary embolus ([place]) 2001   after tram flap surgery   Past Surgical History:  Procedure Laterality Date  . DILATION AND CURETTAGE OF UTERUS   40yr ago   x 2  . KNEE CLOSED REDUCTION Right 06/01/2018   Procedure: CLOSED MANIPULATION RIGHT KNEE;  Surgeon: AGaynelle Arabian MD;  Location: [facility];  Service: Orthopedics;  Laterality: Right;  . KNEE CLOSED REDUCTION Left 02/22/2019   Procedure: CLOSED MANIPULATION KNEE;  Surgeon: AGaynelle Arabian MD;  Location: [facility];  Service: Orthopedics;  Laterality: Left;  16m  . KNEE SURGERY  2006   left torn cartilage repair  . LEFT HEART CATHETERIZATION WITH CORONARY ANGIOGRAM N/A 06/15/2012   Procedure: LEFT HEART CATHETERIZATION WITH CORONARY ANGIOGRAM;  Surgeon: MuWellington HampshireMD;  [facility];  Service: Cardiovascular;  Laterality: N/A;  . MASTECTOMY     bilateral  . RECONSTRUCTION BREAST W/ TRAM FLAP Left 2001  . TOTAL KNEE ARTHROPLASTY Right 03/07/2018   Procedure: RIGHT TOTAL KNEE ARTHROPLASTY;  Surgeon: AlGaynelle ArabianMD;  Location: [facility];  Service: Orthopedics;  Laterality: Right;  . TOTAL KNEE ARTHROPLASTY Left 10/31/2018   Procedure: TOTAL KNEE ARTHROPLASTY;  Surgeon: AlGaynelle ArabianMD;  Location: [facility];  Service: Orthopedics;  Laterality: Left;  5087m   Current Outpatient Medications  Medication Sig Dispense Refill  . acetaminophen (TYLENOL) 500 MG tablet Take 1,000 mg by mouth as needed for mild pain or moderate pain.     . aMarland Kitchenoxicillin (AMOXIL) 500 MG capsule For dental appointments    . Ascorbic Acid (VITAMIN C WITH ROSE HIPS) 500 MG tablet Take 500 mg by mouth daily.    . aMarland Kitchenorvastatin (LIPITOR) 20 MG tablet TAKE 2 TABLETS (40 MG TOTAL) BY MOUTH EVERY EVENING. 180 tablet 2  . Blood Glucose Monitoring Suppl (ACCU-CHEK AVIVA PLUS) w/Device KIT Use to check FBS QAM - DX:E11.9 1 kit 1  . Blood Glucose Monitoring Suppl (ONE TOUCH  ULTRA 2) W/DEVICE KIT Check FBS qam - Dx:250.00 and she will need lancets (1 box) , strips (1 bottle = 100) and controls also thanks 1 each 0  . carvedilol (COREG) 12.5 MG tablet TAKE 1 TABLET IN THE MORNING AND 1.5 TABLETS IN THE EVENING 225 tablet 3  . colesevelam (WELCHOL) 625 MG tablet TAAKE 3 TABLETS BY MOUTH TWICE A DAY WITH A MEAL 540 tablet 3  . DEXILANT 60 MG capsule TAKE ONE CAPSULE BY MOUTH EVERY DAY 90 capsule 3  . ELIQUIS 5 MG TABS tablet TAKE 1 TABLET BY MOUTH TWICE A DAY 180 tablet 3  . fluticasone (FLONASE) 50 MCG/ACT nasal spray USE 2 SPRAYS IN EACH NOSTRIL  DAILY FOR 3 MONTHS 48 g 3  . Ginkgo Biloba 120 MG TABS Take 120 mg by mouth daily.    . Glucosamine-Chondroitin (COSAMIN DS PO) Take 1 tablet by mouth 2 (two) times a day.    Marland Kitchen glucose blood test strip Use to check BS QAM DX: E11.9 100 each 3  . Lancets (ACCU-CHEK SOFT TOUCH) lancets Use to check BS QAM DX: E11.9 100 each 3  . losartan (COZAAR) 100 MG tablet Take 1 tablet (100 mg total) by mouth daily. 90 tablet 3  . Multiple Vitamin (MULTIVITAMIN WITH MINERALS) TABS tablet Take 1 tablet by mouth daily. Senior Multivitamin Iron Free Formula    . spironolactone (ALDACTONE) 25 MG tablet TAKE 1 TABLET BY MOUTH EVERY DAY 90 tablet 3  . vitamin B-12 (CYANOCOBALAMIN) 1000 MCG tablet Take 1,000 mcg by mouth daily.    Marland Kitchen VITAMIN D, CHOLECALCIFEROL, PO Take by mouth daily.     . zafirlukast (ACCOLATE) 20 MG tablet TAKE 1 TABLET (20 MG TOTAL) BY MOUTH 2 (TWO) TIMES DAILY. 180 tablet 4   No current facility-administered medications for this encounter.    Allergies  Allergen Reactions  . Adhesive [Tape] Other (See Comments)    Skin turns red   . Diphenhydramine Other (See Comments)    Pt feels wired, hyperactive  . Vicodin [Hydrocodone-Acetaminophen] Nausea And Vomiting    Social History   Socioeconomic History  . Marital status: Married    Spouse name: Not on file  . Number of children: 2  . Years of education: Not on file   . Highest education level: Not on file  Occupational History  . 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 Smoking status: Never Smoker  . Smokeless tobacco: Never Used  Vaping Use  . Vaping Use: Never used  Substance and Sexual Activity  . Alcohol use: No  . Drug use: No  . Sexual activity: Never  Other Topics Concern  . Not on file  Social History Narrative  . Not on file   Social Determinants of Health   Financial Resource Strain:   . Difficulty of Paying Living Expenses: Not on file  Food Insecurity:   . Worried About Charity fundraiser in the Last Year: Not on file  . Ran Out of Food in the Last Year: Not on file  Transportation Needs:   . Lack of Transportation (Medical): Not on file  . Lack of Transportation (Non-Medical): Not on file  Physical Activity:   . Days of Exercise per Week: Not on file  . Minutes of Exercise per Session: Not on file  Stress:   . Feeling of Stress : Not on file  Social Connections:   . Frequency of Communication with Friends and Family: Not on file  . Frequency of Social Gatherings with Friends and Family: Not on file  . Attends Religious Services: Not on file  . Active Member of Clubs or Organizations: Not on file  . Attends Archivist Meetings: Not on file  . Marital Status: Not on file  Intimate Partner Violence:   . Fear of Current or Ex-Partner: Not on file  . Emotionally Abused: Not on file  . Physically Abused: Not on file  . Sexually Abused: Not on file     ROS- All systems are reviewed and negative except as per the HPI above.  Physical Exam: Vitals:   07/23/20 1036  BP: 108/62  Pulse: 68  Weight: 81.7 kg  Height: 5' 2"  (1.575 m)    GEN- The patient is well appearing obese elderly female, alert and oriented x 3 today.   HEENT-head normocephalic, atraumatic, sclera clear, conjunctiva pink, hearing intact, trachea midline. Lungs- Clear to ausculation  bilaterally, normal work of breathing Heart- Regular rate and rhythm, no murmurs, rubs or gallops  GI- soft, NT, ND, + BS Extremities- no clubbing, cyanosis, or edema MS- no significant deformity or atrophy Skin- no rash or lesion Psych- euthymic mood, full affect Neuro- strength and sensation are intact   Wt Readings from Last 3 Encounters:  07/23/20 81.7 kg  05/23/20 81.6 kg  01/22/20 81.9 kg    EKG today demonstrates SR HR 68, LBBB, PR 196, QRS 150, QTc 448  Echo 10/31/19 demonstrated  1. Left ventricular ejection fraction, by visual estimation, is 30 to  35%. The left ventricle has moderately decreased function. There is no  left ventricular hypertrophy.  2. Left ventricular diastolic parameters are consistent with Grade I  diastolic dysfunction (impaired relaxation).  3. The left ventricle demonstrates global hypokinesis.  4. Global right ventricle has normal systolic function.The right  ventricular size is normal. No increase in right ventricular wall  thickness.  5. Left atrial size was normal.  6. Right atrial size was normal.  7. The mitral valve is normal in structure. Trivial mitral valve  regurgitation. No evidence of mitral stenosis.  8. The tricuspid valve is normal in structure.  9. The tricuspid valve is normal in structure. Tricuspid valve  regurgitation is trivial.  10. The aortic valve is normal in structure. Aortic valve regurgitation is  not visualized. No evidence of aortic valve sclerosis or stenosis.  11. The pulmonic valve was grossly normal. Pulmonic valve regurgitation is  trivial.  12. The atrial septum is grossly normal.   Epic records are reviewed at length today  CHA2DS2-VASc Score = 6 The patient's score is based upon: CHF History: Yes HTN History: Yes Age : 19 + Diabetes History: Yes Stroke History: No Vascular Disease History: No Gender: Female   ASSESSMENT AND PLAN: 1. Paroxysmal Atrial Fibrillation (ICD10:  I48.0) The  patient's CHA2DS2-VASc score is 6, indicating a 9.7% annual risk of stroke.   Patient appears to be maintaining SR with rare episodes.  Kardia for home monitoring.   Continue Eliquis 5 mg BID. Continue Coreg 12.5 mg AM and 18.75 mg PM  2. Secondary Hypercoagulable State (ICD10:  D68.69) The patient is at significant risk for stroke/thromboembolism based upon her CHA2DS2-VASc Score of 6.  Continue Apixaban (Eliquis).   3. Obesity Body mass index is 32.96 kg/m. Lifestyle modification was discussed and encouraged including regular physical activity and weight reduction.  4. HTN Stable, no changes today.  5. NICM Cardiac MRI showed EF 54% No signs or symptoms of fluid overload.   Follow up with Dr Aundra Dubin in 3 months. AF clinic in 6 months.     [facility] [address] [place], [place] [address] [phone] 07/23/2020 11:18 AM

## 2020-08-04 ENCOUNTER — Other Ambulatory Visit: Payer: Self-pay | Admitting: Family Medicine

## 2020-08-07 ENCOUNTER — Other Ambulatory Visit: Payer: Self-pay | Admitting: Family Medicine

## 2020-08-20 ENCOUNTER — Ambulatory Visit: Payer: Medicare Other | Attending: Internal Medicine

## 2020-08-20 DIAGNOSIS — Z23 Encounter for immunization: Secondary | ICD-10-CM

## 2020-08-20 NOTE — Progress Notes (Signed)
   Covid-19 Vaccination Clinic  Name:  [name]    MRN: [id number] DOB: 19-Jul-1946  08/20/2020  [name] was observed post Covid-19 immunization for 15 minutes without incident. She was provided with Vaccine Information Sheet and instruction to access the V-Safe system.   [name] was instructed to call 911 with any severe reactions post vaccine: Marland Kitchen Difficulty breathing  . Swelling of face and throat  . A fast heartbeat  . A bad rash all over body  . Dizziness and weakness   Immunizations Administered    No immunizations on file.

## 2020-08-23 DIAGNOSIS — Z96651 Presence of right artificial knee joint: Secondary | ICD-10-CM | POA: Diagnosis not present

## 2020-08-23 DIAGNOSIS — M25561 Pain in right knee: Secondary | ICD-10-CM | POA: Diagnosis not present

## 2020-09-20 ENCOUNTER — Other Ambulatory Visit: Payer: Self-pay | Admitting: Student

## 2020-09-20 ENCOUNTER — Other Ambulatory Visit (HOSPITAL_COMMUNITY): Payer: Self-pay | Admitting: Student

## 2020-09-20 DIAGNOSIS — Z96651 Presence of right artificial knee joint: Secondary | ICD-10-CM | POA: Diagnosis not present

## 2020-09-28 ENCOUNTER — Other Ambulatory Visit: Payer: Self-pay | Admitting: Family Medicine

## 2020-10-02 ENCOUNTER — Other Ambulatory Visit: Payer: Self-pay

## 2020-10-02 ENCOUNTER — Encounter (HOSPITAL_COMMUNITY)
Admission: RE | Admit: 2020-10-02 | Discharge: 2020-10-02 | Disposition: A | Discharge status: Home / Self Care | Payer: Medicare Other | Source: Ambulatory Visit | Attending: Student | Admitting: Student

## 2020-10-02 DIAGNOSIS — Z96651 Presence of right artificial knee joint: Secondary | ICD-10-CM | POA: Insufficient documentation

## 2020-10-02 DIAGNOSIS — Z96653 Presence of artificial knee joint, bilateral: Secondary | ICD-10-CM | POA: Diagnosis not present

## 2020-10-02 DIAGNOSIS — Z471 Aftercare following joint replacement surgery: Secondary | ICD-10-CM | POA: Diagnosis not present

## 2020-10-02 MED ORDER — TECHNETIUM TC 99M MEDRONATE IV KIT
20.0000 | PACK | Freq: Once | INTRAVENOUS | Status: AC | PRN
  Administered 2020-10-02: 20 via INTRAVENOUS

## 2020-10-23 ENCOUNTER — Encounter (HOSPITAL_COMMUNITY): Payer: Self-pay | Admitting: Cardiology

## 2020-10-23 ENCOUNTER — Other Ambulatory Visit: Payer: Self-pay

## 2020-10-23 ENCOUNTER — Ambulatory Visit (HOSPITAL_COMMUNITY)
Admission: RE | Admit: 2020-10-23 | Discharge: 2020-10-23 | Disposition: A | Discharge status: Home / Self Care | Payer: Medicare Other | Source: Ambulatory Visit | Attending: Cardiology | Admitting: Cardiology

## 2020-10-23 VITALS — BP 110/68 | HR 73 | Wt 180.4 lb

## 2020-10-23 DIAGNOSIS — Z79899 Other long term (current) drug therapy: Secondary | ICD-10-CM | POA: Insufficient documentation

## 2020-10-23 DIAGNOSIS — Z86711 Personal history of pulmonary embolism: Secondary | ICD-10-CM | POA: Insufficient documentation

## 2020-10-23 DIAGNOSIS — E785 Hyperlipidemia, unspecified: Secondary | ICD-10-CM | POA: Diagnosis not present

## 2020-10-23 DIAGNOSIS — G4733 Obstructive sleep apnea (adult) (pediatric): Secondary | ICD-10-CM | POA: Insufficient documentation

## 2020-10-23 DIAGNOSIS — I509 Heart failure, unspecified: Secondary | ICD-10-CM | POA: Insufficient documentation

## 2020-10-23 DIAGNOSIS — I447 Left bundle-branch block, unspecified: Secondary | ICD-10-CM | POA: Insufficient documentation

## 2020-10-23 DIAGNOSIS — I110 Hypertensive heart disease with heart failure: Secondary | ICD-10-CM | POA: Diagnosis not present

## 2020-10-23 DIAGNOSIS — I428 Other cardiomyopathies: Secondary | ICD-10-CM | POA: Insufficient documentation

## 2020-10-23 DIAGNOSIS — C50912 Malignant neoplasm of unspecified site of left female breast: Secondary | ICD-10-CM | POA: Diagnosis not present

## 2020-10-23 DIAGNOSIS — Z9013 Acquired absence of bilateral breasts and nipples: Secondary | ICD-10-CM | POA: Insufficient documentation

## 2020-10-23 DIAGNOSIS — E119 Type 2 diabetes mellitus without complications: Secondary | ICD-10-CM | POA: Insufficient documentation

## 2020-10-23 DIAGNOSIS — Z853 Personal history of malignant neoplasm of breast: Secondary | ICD-10-CM | POA: Insufficient documentation

## 2020-10-23 DIAGNOSIS — I480 Paroxysmal atrial fibrillation: Secondary | ICD-10-CM | POA: Diagnosis not present

## 2020-10-23 DIAGNOSIS — Z7901 Long term (current) use of anticoagulants: Secondary | ICD-10-CM | POA: Insufficient documentation

## 2020-10-23 HISTORY — DX: Heart failure, unspecified: I50.9

## 2020-10-23 LAB — BASIC METABOLIC PANEL
Anion gap: 10 (ref 5–15)
BUN: 26 mg/dL — ABNORMAL HIGH (ref 8–23)
CO2: 22 mmol/L (ref 22–32)
Calcium: 9.5 mg/dL (ref 8.9–10.3)
Chloride: 108 mmol/L (ref 98–111)
Creatinine, Ser: 1.04 mg/dL — ABNORMAL HIGH (ref 0.44–1.00)
GFR, Estimated: 56 mL/min — ABNORMAL LOW (ref >60)
Glucose, Bld: 114 mg/dL — ABNORMAL HIGH (ref 70–99)
Potassium: 4.5 mmol/L (ref 3.5–5.1)
Sodium: 140 mmol/L (ref 135–145)

## 2020-10-23 LAB — CBC
HCT: 33.5 % — ABNORMAL LOW (ref 36.0–46.0)
Hemoglobin: 10.8 g/dL — ABNORMAL LOW (ref 12.0–15.0)
MCH: 30.8 pg (ref 26.0–34.0)
MCHC: 32.2 g/dL (ref 30.0–36.0)
MCV: 95.4 fL (ref 80.0–100.0)
Platelets: 297 10*3/uL (ref 150–400)
RBC: 3.51 MIL/uL — ABNORMAL LOW (ref 3.87–5.11)
RDW: 12.7 % (ref 11.5–15.5)
WBC: 6.4 10*3/uL (ref 4.0–10.5)
nRBC: 0 % (ref 0.0–0.2)

## 2020-10-23 NOTE — Patient Instructions (Signed)
Labs done today, your results will be available in MyChart, we will contact you for abnormal readings.  Your physician has recommended that you have a sleep study. This test records several body functions during sleep, including: brain activity, eye movement, oxygen and carbon dioxide blood levels, heart rate and rhythm, breathing rate and rhythm, the flow of air through your mouth and nose, snoring, body muscle movements, and chest and belly movement. ONCE WE APPROVE WITH INSURANCE WE WILL CONTACT YOU TO SCHEDULE  Please call our office in June 2022 to schedule your follow up visit  If you have any questions or concerns before your next appointment please send [place] a message through [place] or call our office at [phone].    TO LEAVE A MESSAGE FOR THE NURSE SELECT OPTION 2, PLEASE LEAVE A MESSAGE INCLUDING: . YOUR NAME . DATE OF BIRTH . CALL BACK NUMBER . REASON FOR CALL**this is important as we prioritize the call backs  YOU WILL RECEIVE A CALL BACK THE SAME DAY AS LONG AS YOU CALL BEFORE 4:00 PM

## 2020-10-23 NOTE — Progress Notes (Signed)
Patient ID: [name], female   DOB: 1946/08/04, 75 y.o.   MRN: [id number]    Advanced Heart Failure Clinic Note   PCP: Dr. Dennard Schaumann HF: Dr. Aundra Dubin   75 y.o. with history of HTN and type II diabetes presents for followup of CHF.  Back in 2009, she had an echo with normal EF.  She was admitted in 9/13 with chest pain and LBBB.  She had LHC showing no CAD but EF 35-40%.  Echo was a technically difficult study with EF reported as 50-55%.  She has been taking ARB and Coreg.  Repeat echo in 12/13 showed EF 40% with septal hypokinesis.  Cardiac MRI was done in 12/13 as well, with calculated EF 50%, global hypokinesis, and no delayed enhancement.  Echo in 7/15 showed EF 55-60% with mild MR and mild TR (no LBBB at this time).   In 3/16, she began to develop increased dyspnea.  She was seen by Truitt Merle and was noted to have a LBBB again. Echo in 3/16 showed EF down to 35-40%.  She was then seen in followup by Rosaria Ferries.  Alivecor on her phone showed a period of HR up to 160s that appeared to be atrial fibrillation.  She felt tachypalpitations. Coreg was increased and she was started on apixaban 5 mg bid. CPX in 4/16 showed a submaximal effort but normal spirometry and normal peak VO2.  She did have ST changes possibly concerning for ischemia (but has baseline LBBB so hard to interpret).    Cardiolite in 5/16 showed on ischemia or infarction.  Most recent echo in 2/17 showed EF 45-50%, diffuse hypokinesis, grade I diastolic dysfunction, normal RV size and systolic function.  Echo 1/18 with EF stable at 45%.  Echo in 4/19 showed EF 50-55% with septal dyssynergy.   Echo in 1/21 showed EF 40-45%, normal RV.  Cardiac MRI was done in 5/21, showing LV EF 54%, septal-lateral dyssynchrony c/w LBBB, RV EF 53%, no LGE.   Patient has had about 4 episodes of atrial fibrillation in the last year.  Episodes last a few hours then resolve, she will take an extra Coreg dose.  Otherwise, she has been doing well.  Main  limitation is knee pain.  No significant exertional dyspnea.  She has daytime sleepiness, she is unsure if she snores.  She is in NSR today. Weight is stable.     ECG (personally reviewed): NSR, LBBB  Labs (9/13): TSH 4.884 (elevated), proBNP 155 Labs (10/13): K 3.6, creatinine 0.8 Labs (1/14): K 3.7, creatinine 0.8, BNP not elevated, ANA weakly positive, TSH normal Labs (7/14): BNP normal Labs (11/14): K 4.1, creatinine 0.73 Labs (10/15): K 4.2, creatinine 0.73, LDL 45 Labs (3/16): BNP 46, HCT 42.2, TSH normal Labs (4/16): K 4.1, creatinine 0.78 => 0.8, HCT 39.3 Labs (1/17): K 4.6, creatinine 1.06, hgb 13.1, BNP 22 Labs (10/17): LDL 49, HDL 37, K 4.8, creatinine 0.92, TSH normal, BNP 62 Labs (4/18): LDL 48, K 4.4, creatinine 0.91 Labs (7/19): K 4.3, creatinine 1.02, 11.7  Labs (6/20): K 4.8, creatinine 0.94 Labs (8/21): K 4.8, creatinine 0.91, LDL 42  PMH: 1. LBBB/IVCD: Now persistent.   2. Breast cancer s/p bilateral mastectomy. 3. GERD 4. Type II diabetes mellitus 5. HTN 6. Asthma 7. PE in 2001 8. Hyperlipidemia 9. Nonischemic Cardiomyopathy: Possible LBBB cardiomyopathy. Echo 8/09 with 60%.  Admitted in 9/13 with chest pain.  LHC showed no angiographic CAD and EF 35-40%.  Echo at that time was read as  showing EF 50-55% but was a technically difficult study.  Echo (12/13): EF 40% with septal hypokinesis, normal RV size and systolic function, no significant valvular abnormalities.  Cardiac MRI (12/13): EF 50%, global hypokinesis, no delayed enhancement.  Echo (7/14) with EF 50-55%, mild LV dilation, mild LAE.  Echo (7/15) with EF 55-60%, mild MR and mild TR.  Echo (3/16) with EF 35-40%, septal-lateral dyssynchrony.  CPX (4/16) with RER 0.98 (submaximal), peak VO2 19.5, VE/VCO2 slope 29.4 => normal spirometry, normal peak VO2, but ST changes suggest possibility of ischemia.  Lexiscan Cardiolite (5/16) with no ischemia/infarction. - Echo (2/17) with EF 45-50%, diffuse hypokinesis, grade I  diastolic dysfunction, normal RV size and systolic function.  - Echo (1/18) with EF 45%, septal-lateral dyssynchrony, septal hypokinesis, normal RV size and systolic function.  - Echo (4/19) with EF 50-55%, mild LVH, septal-lateral dyssynchony.  - Cardiac MRI (5/21): LV EF 54%, septal-lateral dyssynchrony c/w LBBB, RV EF 53%, no LGE.  10. Supraclavicular lymphadenopathy: Stable by CT back to 2009, suspect benign.  11. Atrial fibrillation: Paroxysmal.  Holter (4/16) with rare PACs and PVCs, short run 5 beats SVT.  - Atrial fibrillation transiently in 1/21.  12. Right TKR 6/19, left TKR 1/20  SH: Married, lives in [facility], never smoked.  FH: Uncle with ? SCD.  Brother with ? SCD.    ROS: All systems reviewed and negative except as per HPI.   Current Outpatient Medications  Medication Sig Dispense Refill  . acetaminophen (TYLENOL) 500 MG tablet Take 1,000 mg by mouth as needed for mild pain or moderate pain.     Marland Kitchen amoxicillin (AMOXIL) 500 MG capsule For dental appointments    . Ascorbic Acid (VITAMIN C WITH ROSE HIPS) 500 MG tablet Take 500 mg by mouth daily.    Marland Kitchen atorvastatin (LIPITOR) 20 MG tablet TAKE 2 TABLETS (40 MG TOTAL) BY MOUTH EVERY EVENING. 180 tablet 2  . Blood Glucose Monitoring Suppl (ACCU-CHEK AVIVA PLUS) w/Device KIT Use to check FBS QAM - DX:E11.9 1 kit 1  . Blood Glucose Monitoring Suppl (ONE TOUCH ULTRA 2) W/DEVICE KIT Check FBS qam - Dx:250.00 and she will need lancets (1 box) , strips (1 bottle = 100) and controls also thanks 1 each 0  . carvedilol (COREG) 12.5 MG tablet TAKE 1 TABLET IN THE MORNING AND 1.5 TABLETS IN THE EVENING 225 tablet 3  . colesevelam (WELCHOL) 625 MG tablet TAAKE 3 TABLETS BY MOUTH TWICE A DAY WITH A MEAL 540 tablet 3  . DEXILANT 60 MG capsule TAKE ONE CAPSULE BY MOUTH EVERY DAY 90 capsule 3  . ELIQUIS 5 MG TABS tablet TAKE 1 TABLET BY MOUTH TWICE A DAY 180 tablet 3  . fluticasone (FLONASE) 50 MCG/ACT nasal spray USE 2 SPRAYS IN  EACH NOSTRIL DAILY FOR 3 MONTHS 48 g 3  . Ginkgo Biloba 120 MG TABS Take 120 mg by mouth daily.    . Glucosamine-Chondroitin (COSAMIN DS PO) Take 1 tablet by mouth 2 (two) times a day.    Marland Kitchen glucose blood test strip Use to check BS QAM DX: E11.9 100 each 3  . Lancets (ACCU-CHEK SOFT TOUCH) lancets Use to check BS QAM DX: E11.9 100 each 3  . losartan (COZAAR) 100 MG tablet Take 1 tablet (100 mg total) by mouth daily. 90 tablet 3  . Multiple Vitamin (MULTIVITAMIN WITH MINERALS) TABS tablet Take 1 tablet by mouth daily. Senior Multivitamin Iron Free Formula    . spironolactone (ALDACTONE) 25 MG tablet  TAKE 1 TABLET BY MOUTH EVERY DAY 90 tablet 3  . vitamin B-12 (CYANOCOBALAMIN) 1000 MCG tablet Take 1,000 mcg by mouth daily.    Marland Kitchen VITAMIN D, CHOLECALCIFEROL, PO Take by mouth daily.     . zafirlukast (ACCOLATE) 20 MG tablet TAKE 1 TABLET (20 MG TOTAL) BY MOUTH 2 (TWO) TIMES DAILY. 180 tablet 4   No current facility-administered medications for this encounter.    BP 110/68   Pulse 73   Wt 81.8 kg (180 lb 6.4 oz)   SpO2 97%   BMI 33.00 kg/m    Wt Readings from Last 3 Encounters:  10/23/20 81.8 kg (180 lb 6.4 oz)  07/23/20 81.7 kg (180 lb 3.2 oz)  05/23/20 81.6 kg (180 lb)   General: NAD Neck: No JVD, no thyromegaly or thyroid nodule.  Lungs: Clear to auscultation bilaterally with normal respiratory effort. CV: Nondisplaced PMI.  Heart regular S1/S2, no S3/S4, no murmur.  No peripheral edema.  No carotid bruit.  Normal pedal pulses.  Abdomen: Soft, nontender, no hepatosplenomegaly, no distention.  Skin: Intact without lesions or rashes.  Neurologic: Alert and oriented x 3.  Psych: Normal affect. Extremities: No clubbing or cyanosis.  HEENT: Normal.   Assessment/Plan: 1. Nonischemic cardiomyopathy:   No CAD on cath 9/13 but EF 35-40% at that time.  ECG showed LBBB.  LBBB resolved and EF normalized.  However, in 3/16, patient had fatigue and dyspnea => repeat echo with EF back down to  35-40%, she now has a persistent LBBB. This may be a LBBB cardiomyopathy.  CPX in 4/16 was submaximal but showed normal spirometry and actually near normal peak VO2.  Echo in 4/19 showed EF stable at 50-55% with septal-lateral dyssynchrony.  Cardiac MRI in 5/21 showed LV EF 54%, septal-lateral dyssynchrony c/w LBBB, RV EF 53%, no LGE.  NYHA class I-II symptoms. Volume status stable on exam. - Continue Coreg 12.5 qam/18.75 qpm (she cannot tolerate 18.75 bid due to dizziness/fatigue).   - Continue losartan 100 mg daily.  - Continue spironolactone 25 mg daily. BMET today.  2. Hyperlipidemia:  Continue atorvastatin 40 mg qhs, good lipids in 8/21. 3. Atrial fibrillation: Paroxysmal.  NSR today.  She has had about 4 short episodes in the last year.   - Continue Eliquis 5 mg bid.  CBC today.  - If atrial fibrillation becomes more frequent, could consider an anti-arrhythmic (dronedarone or flecainide).  4. LBBB: Persistent and may be cause of mild cardiomyopathy.  5. OSA: With daytime sleepiness and atrial fibrillation, I will arrange for a home sleep study.   Followup in 6 months but will need BMET every 3 months with spironolactone use.   Loralie Champagne 10/23/2020

## 2020-10-23 NOTE — Progress Notes (Signed)
Height: 5"4    Weight: 180lbs BMI: 33  Today's Date: 10/23/20  STOP BANG RISK ASSESSMENT S (snore) Have you been told that you snore?     YES   T (tired) Are you often tired, fatigued, or sleepy during the day?   YES  O (obstruction) Do you stop breathing, choke, or gasp during sleep? NO   P (pressure) Do you have or are you being treated for high blood pressure? YES   B (BMI) Is your body index greater than 35 kg/m? NO   A (age) Are you 77 years old or older? YES   N (neck) Do you have a neck circumference greater than 16 inches?      G (gender) Are you a female? NO   TOTAL STOP/BANG "YES" ANSWERS 4                                                                       For Office Use Only              Procedure Order Form    YES to 3+ Stop Bang questions OR two clinical symptoms - patient qualifies for WatchPAT (CPT 95800)      Clinical Notes: Will consult Sleep Specialist and refer for management of therapy due to patient increased risk of Sleep Apnea. Ordering a sleep study due to the following two clinical symptoms: Excessive daytime sleepiness G47.10 /Loud snoring R06.83

## 2020-11-02 ENCOUNTER — Other Ambulatory Visit (HOSPITAL_COMMUNITY): Payer: Self-pay | Admitting: Cardiology

## 2020-11-10 ENCOUNTER — Other Ambulatory Visit (HOSPITAL_COMMUNITY): Payer: Self-pay | Admitting: Cardiology

## 2020-11-20 ENCOUNTER — Encounter (HOSPITAL_COMMUNITY): Payer: Self-pay | Admitting: Physician Assistant

## 2020-11-20 ENCOUNTER — Other Ambulatory Visit: Payer: Self-pay

## 2020-11-20 ENCOUNTER — Ambulatory Visit (HOSPITAL_COMMUNITY)
Admission: RE | Admit: 2020-11-20 | Discharge: 2020-11-20 | Disposition: A | Discharge status: Home / Self Care | Payer: Medicare Other | Source: Ambulatory Visit | Attending: Physician Assistant | Admitting: Physician Assistant

## 2020-11-20 VITALS — BP 114/62 | HR 69 | Ht 62.0 in | Wt 180.4 lb

## 2020-11-20 DIAGNOSIS — Z888 Allergy status to other drugs, medicaments and biological substances status: Secondary | ICD-10-CM | POA: Insufficient documentation

## 2020-11-20 DIAGNOSIS — D6869 Other thrombophilia: Secondary | ICD-10-CM | POA: Insufficient documentation

## 2020-11-20 DIAGNOSIS — Z79899 Other long term (current) drug therapy: Secondary | ICD-10-CM | POA: Insufficient documentation

## 2020-11-20 DIAGNOSIS — Z9013 Acquired absence of bilateral breasts and nipples: Secondary | ICD-10-CM | POA: Insufficient documentation

## 2020-11-20 DIAGNOSIS — Z885 Allergy status to narcotic agent status: Secondary | ICD-10-CM | POA: Insufficient documentation

## 2020-11-20 DIAGNOSIS — I10 Essential (primary) hypertension: Secondary | ICD-10-CM | POA: Diagnosis not present

## 2020-11-20 DIAGNOSIS — G4733 Obstructive sleep apnea (adult) (pediatric): Secondary | ICD-10-CM | POA: Insufficient documentation

## 2020-11-20 DIAGNOSIS — Z6833 Body mass index (BMI) 33.0-33.9, adult: Secondary | ICD-10-CM | POA: Insufficient documentation

## 2020-11-20 DIAGNOSIS — E119 Type 2 diabetes mellitus without complications: Secondary | ICD-10-CM | POA: Diagnosis not present

## 2020-11-20 DIAGNOSIS — I428 Other cardiomyopathies: Secondary | ICD-10-CM | POA: Insufficient documentation

## 2020-11-20 DIAGNOSIS — I480 Paroxysmal atrial fibrillation: Secondary | ICD-10-CM | POA: Insufficient documentation

## 2020-11-20 DIAGNOSIS — E669 Obesity, unspecified: Secondary | ICD-10-CM | POA: Insufficient documentation

## 2020-11-20 DIAGNOSIS — E785 Hyperlipidemia, unspecified: Secondary | ICD-10-CM | POA: Insufficient documentation

## 2020-11-20 DIAGNOSIS — I5022 Chronic systolic (congestive) heart failure: Secondary | ICD-10-CM | POA: Insufficient documentation

## 2020-11-20 DIAGNOSIS — Z96653 Presence of artificial knee joint, bilateral: Secondary | ICD-10-CM | POA: Insufficient documentation

## 2020-11-20 MED ORDER — DILTIAZEM HCL 30 MG PO TABS: ORAL_TABLET | ORAL | 1 refills | Status: DC

## 2020-11-20 NOTE — Progress Notes (Signed)
Primary Care Physician: Susy Frizzle, MD Primary Cardiologist: Dr Aundra Dubin Primary Electrophysiologist: none Referring Physician: Dr Milon Score [name] is a 75 y.o. female with a history of HTN, DM, systolic CHF, asthma, HLD, and paroxysmal atrial fibrillation who presents for follow up in the [facility].  The patient was initially diagnosed with atrial fibrillation 12/2014 after presenting with symptoms of increased dyspnea. Her Jodelle Red showed atrial fibrillation. Patient is on Eliquis for a CHADS2VASC score of 6. On 12/21/19, patient reported that she was "feeling funny" and checked her Jodelle Red which showed rapid afib. She took an extra dose of Coreg but her rapid rates persistent and she presented to the ER. She spontaneously converted in the ER. There were no specific triggers that she could identify. She denies snoring or alcohol use.  On follow up today, patient reports that she had a 5 hour episode of afib with symptoms of tachypalpitations. She checked her heart rate with her BP machine which showed 160-170 bpm. She took her evening dose of Coreg early and laid down to rest and the afib resolved. There were no specific triggers that she could identify. She denies any bleeding issues on anticoagulation.   Today, she denies symptoms of chest pain, shortness of breath, orthopnea, PND, lower extremity edema, dizziness, presyncope, syncope, snoring, daytime somnolence, bleeding, or neurologic sequela. The patient is tolerating medications without difficulties and is otherwise without complaint today.    Atrial Fibrillation Risk Factors:  she does not have symptoms or diagnosis of sleep apnea. she does not have a history of rheumatic fever. she does not have a history of alcohol use. The patient does not have a history of early familial atrial fibrillation or other arrhythmias.  she has a BMI of Body mass index is 33 kg/m.Marland Kitchen Filed Weights   11/20/20 1412   Weight: 81.8 kg    Family History  Problem Relation Age of Onset  . Asthma Maternal Grandmother   . Breast cancer Maternal Grandmother        dx. 29s  . Stomach cancer Maternal Grandmother 60       lots of stomach problems  . Heart attack Paternal Grandfather   . Other Daughter        cyst on ovary; unilateral oophorectomy  . Heart attack Brother   . Cancer Maternal Aunt        unknown type  . Celiac disease Maternal Aunt   . Diabetes Maternal Uncle   . Colon cancer Cousin        dx. 50s-60s  . Colon cancer Paternal Aunt        dx. late 24s  . Lung cancer Paternal Aunt        dx. late 60s-70s; former smoker  . Heart attack Paternal Aunt   . Heart attack Paternal Uncle      Atrial Fibrillation Management history:  Previous antiarrhythmic drugs: none Previous cardioversions: none Previous ablations: none CHADS2VASC score: 6 Anticoagulation history: Eliquis   Past Medical History:  Diagnosis Date  . Allergy history unknown   . Arthritis    oa  . Asthma    takes acolate for  . Breast cancer ([place]) 1999; 2008   hx of bilateral breast cancer  . Breast cancer, left breast ([place]) 10/27/2012   3.2 cm ER positive, Her-2 negative, 1 node positive S/P mastectomy/TTRAM reconstruction 4/08  . Breast cancer, right breast ([place]) 10/27/2012   3.2 cm ER/PR positive, 1 node positive,  Her-2 negative Rx w mastectomy, AC -T chemo, followed by aromatase inhibitor x 5 years dx 4/08  . Cancer [facility])    breast CA  . Cataracts, bilateral   . CHF (congestive heart failure) ([place])   . Dehydration after exertion    after she went berry picking in the summer heat . see ED visit   . Diabetes ([place])   . DM2 (diabetes mellitus, type 2) ([place])    on welchol for  . Dyspnea    with exertion  . Elevated cholesterol   . GERD (gastroesophageal reflux disease)   . Hiatal hernia   . Hypertension   . LBBB (left bundle branch block) 10/27/2012  . Leg cramps   . NICM (nonischemic cardiomyopathy) ([place])     a. Echo:  06/14/12: Poor endocardial definition, possible septal HK, EF 50-55%, normal wall motion, mild LAE ;  b.  [facility] 9/13:  normal cors, EF 35-40%,  c. Echo 7/14: EF 50-55%, mild LAE  . Other fatigue 01/01/2015  . PONV (postoperative nausea and vomiting)   . Pulmonary embolus ([place]) 2001   after tram flap surgery   Past Surgical History:  Procedure Laterality Date  . DILATION AND CURETTAGE OF UTERUS   36yr ago   x 2  . KNEE CLOSED REDUCTION Right 06/01/2018   Procedure: CLOSED MANIPULATION RIGHT KNEE;  Surgeon: AGaynelle Arabian MD;  Location: [facility];  Service: Orthopedics;  Laterality: Right;  . KNEE CLOSED REDUCTION Left 02/22/2019   Procedure: CLOSED MANIPULATION KNEE;  Surgeon: AGaynelle Arabian MD;  Location: [facility];  Service: Orthopedics;  Laterality: Left;  174m  . KNEE SURGERY  2006   left torn cartilage repair  . LEFT HEART CATHETERIZATION WITH CORONARY ANGIOGRAM N/A 06/15/2012   Procedure: LEFT HEART CATHETERIZATION WITH CORONARY ANGIOGRAM;  Surgeon: MuWellington HampshireMD;  [facility];  Service: Cardiovascular;  Laterality: N/A;  . MASTECTOMY     bilateral  . RECONSTRUCTION BREAST W/ TRAM FLAP Left 2001  . TOTAL KNEE ARTHROPLASTY Right 03/07/2018   Procedure: RIGHT TOTAL KNEE ARTHROPLASTY;  Surgeon: AlGaynelle ArabianMD;  Location: [facility];  Service: Orthopedics;  Laterality: Right;  . TOTAL KNEE ARTHROPLASTY Left 10/31/2018   Procedure: TOTAL KNEE ARTHROPLASTY;  Surgeon: AlGaynelle ArabianMD;  Location: [facility];  Service: Orthopedics;  Laterality: Left;  5042m   Current Outpatient Medications  Medication Sig Dispense Refill  . acetaminophen (TYLENOL) 500 MG tablet Take 1,000 mg by mouth as needed for mild pain or moderate pain.     . aMarland Kitchenoxicillin (AMOXIL) 500 MG capsule For dental appointments    . Ascorbic Acid (VITAMIN C WITH ROSE HIPS) 500 MG tablet Take 500 mg by mouth daily.    . aMarland Kitchenorvastatin (LIPITOR) 20 MG tablet TAKE 2 TABLETS (40 MG TOTAL) BY MOUTH EVERY EVENING.  180 tablet 2  . Blood Glucose Monitoring Suppl (ACCU-CHEK AVIVA PLUS) w/Device KIT Use to check FBS QAM - DX:E11.9 1 kit 1  . Blood Glucose Monitoring Suppl (ONE TOUCH ULTRA 2) W/DEVICE KIT Check FBS qam - Dx:250.00 and she will need lancets (1 box) , strips (1 bottle = 100) and controls also thanks 1 each 0  . calcium-vitamin D (OSCAL WITH D) 500-200 MG-UNIT TABS tablet calcium carbonate 500 mg-vitamin D3 5 mcg (200 unit) tablet   1 by oral route.    . carvedilol (COREG) 12.5 MG tablet TAKE 1 TABLET IN THE MORNING AND 1.5 TABLETS IN THE EVENING 225 tablet 3  . colesevelam (WEAtl[facility]ariat[facility] Cent[facility]  625 MG tablet TAAKE 3 TABLETS BY MOUTH TWICE A DAY WITH A MEAL 540 tablet 3  . DEXILANT 60 MG capsule TAKE ONE CAPSULE BY MOUTH EVERY DAY 90 capsule 3  . diltiazem (CARDIZEM) 30 MG tablet Take 1 tablet every 4 hours AS NEEDED for AFIB heart rate >100 45 tablet 1  . ELIQUIS 5 MG TABS tablet TAKE 1 TABLET BY MOUTH TWICE A DAY 180 tablet 3  . fluticasone (FLONASE) 50 MCG/ACT nasal spray USE 2 SPRAYS IN EACH NOSTRIL DAILY FOR 3 MONTHS 48 g 3  . Ginkgo Biloba 120 MG TABS Take 120 mg by mouth daily.    . Glucosamine-Chondroitin (COSAMIN DS PO) Take 1 tablet by mouth 2 (two) times a day.    Marland Kitchen glucose blood test strip Use to check BS QAM DX: E11.9 100 each 3  . Lancets (ACCU-CHEK SOFT TOUCH) lancets Use to check BS QAM DX: E11.9 100 each 3  . losartan (COZAAR) 100 MG tablet Take 1 tablet (100 mg total) by mouth daily. 90 tablet 3  . Multiple Vitamin (MULTIVITAMIN WITH MINERALS) TABS tablet Take 1 tablet by mouth daily. Senior Multivitamin Iron Free Formula    . spironolactone (ALDACTONE) 25 MG tablet TAKE 1 TABLET BY MOUTH EVERY DAY 90 tablet 3  . vitamin B-12 (CYANOCOBALAMIN) 1000 MCG tablet Take 1,000 mcg by mouth daily.    Marland Kitchen VITAMIN D, CHOLECALCIFEROL, PO Take by mouth daily.     . zafirlukast (ACCOLATE) 20 MG tablet TAKE 1 TABLET (20 MG TOTAL) BY MOUTH 2 (TWO) TIMES DAILY. 180 tablet 4   No current facility-administered  medications for this encounter.    Allergies  Allergen Reactions  . Adhesive [Tape] Other (See Comments)    Skin turns red   . Diphenhydramine Other (See Comments)    Pt feels wired, hyperactive  . Vicodin [Hydrocodone-Acetaminophen] Nausea And Vomiting    Social History   Socioeconomic History  . Marital status: Married    Spouse name: Not on file  . Number of children: 2  . Years of education: Not on file  . Highest education level: Not on file  Occupational History  . 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 Smoking status: Never Smoker  . Smokeless tobacco: Never Used  Vaping Use  . Vaping Use: Never used  Substance and Sexual Activity  . Alcohol use: No  . Drug use: No  . Sexual activity: Never  Other Topics Concern  . Not on file  Social History Narrative  . Not on file   Social Determinants of Health   Financial Resource Strain: Not on file  Food Insecurity: Not on file  Transportation Needs: Not on file  Physical Activity: Not on file  Stress: Not on file  Social Connections: Not on file  Intimate Partner Violence: Not on file     ROS- All systems are reviewed and negative except as per the HPI above.  Physical Exam: Vitals:   11/20/20 1412  BP: 114/62  Pulse: 69  Weight: 81.8 kg  Height: 5' 2"  (1.575 m)    GEN- The patient is well appearing obese elderly female, alert and oriented x 3 today.   HEENT-head normocephalic, atraumatic, sclera clear, conjunctiva pink, hearing intact, trachea midline. Lungs- Clear to ausculation bilaterally, normal work of breathing Heart- Regular rate and rhythm, no murmurs, rubs or gallops  GI- soft, NT, ND, + BS Extremities- no clubbing, cyanosis, or edema  MS- no significant deformity or atrophy Skin- no rash or lesion Psych- euthymic mood, full affect Neuro- strength and sensation are intact   Wt Readings from Last 3 Encounters:  11/20/20 81.8 kg   10/23/20 81.8 kg  07/23/20 81.7 kg    EKG today demonstrates  SR, LBBB Vent. rate 69 BPM PR interval 194 ms QRS duration 154 ms QT/QTc 438/469 ms  Echo 10/31/19 demonstrated  1. Left ventricular ejection fraction, by visual estimation, is 30 to  35%. The left ventricle has moderately decreased function. There is no  left ventricular hypertrophy.  2. Left ventricular diastolic parameters are consistent with Grade I  diastolic dysfunction (impaired relaxation).  3. The left ventricle demonstrates global hypokinesis.  4. Global right ventricle has normal systolic function.The right  ventricular size is normal. No increase in right ventricular wall  thickness.  5. Left atrial size was normal.  6. Right atrial size was normal.  7. The mitral valve is normal in structure. Trivial mitral valve  regurgitation. No evidence of mitral stenosis.  8. The tricuspid valve is normal in structure.  9. The tricuspid valve is normal in structure. Tricuspid valve  regurgitation is trivial.  10. The aortic valve is normal in structure. Aortic valve regurgitation is  not visualized. No evidence of aortic valve sclerosis or stenosis.  11. The pulmonic valve was grossly normal. Pulmonic valve regurgitation is  trivial.  12. The atrial septum is grossly normal.   Epic records are reviewed at length today  CHA2DS2-VASc Score = 6 The patient's score is based upon: CHF History: Yes HTN History: Yes Age : 37 + Diabetes History: Yes Stroke History: No Vascular Disease History: No Gender: Female   ASSESSMENT AND PLAN: 1. Paroxysmal Atrial Fibrillation (ICD10:  I48.0) The patient's CHA2DS2-VASc score is 6, indicating a 9.7% annual risk of stroke.   We discussed therapeutic options today. Hesitant to use Multaq or class IC agents given h/o variable EF. We discussed dofetilide, amiodarone, and ablation today. She does not wish to start a daily medicine today.  Will start diltiazem 30 mg PRN  q 4 hours for heart racing. Kardia for home monitoring.   Continue Eliquis 5 mg BID Continue Coreg 12.5 mg AM and 18.75 mg PM  2. Secondary Hypercoagulable State (ICD10:  D68.69) The patient is at significant risk for stroke/thromboembolism based upon her CHA2DS2-VASc Score of 6.  Continue Apixaban (Eliquis).   3. Obesity Body mass index is 33 kg/m. Lifestyle modification was discussed and encouraged including regular physical activity and weight reduction.  4. HTN Stable, no changes today.  5. NICM Cardiac MRI showed EF 54% No signs or symptoms of fluid overload.  6. Suspected OSA/snoring Sleep study per Dr Aundra Dubin.   Follow up in the AF clinic as scheduled.    [facility] [address] [place], [place] [address] [phone] 11/20/2020 3:12 PM

## 2020-11-20 NOTE — Patient Instructions (Signed)
Cardizem 30mg -- take 1 tablet every 4 hours AS NEEDED for AFIB heart rate >100 as long as top number of blood pressure >100.   ?

## 2020-12-10 ENCOUNTER — Other Ambulatory Visit: Payer: Self-pay | Admitting: Family Medicine

## 2020-12-12 ENCOUNTER — Other Ambulatory Visit (HOSPITAL_COMMUNITY): Payer: Self-pay | Admitting: Cardiology

## 2020-12-13 ENCOUNTER — Other Ambulatory Visit (HOSPITAL_COMMUNITY): Payer: Self-pay | Admitting: Physician Assistant

## 2020-12-19 DIAGNOSIS — Z01419 Encounter for gynecological examination (general) (routine) without abnormal findings: Secondary | ICD-10-CM | POA: Diagnosis not present

## 2020-12-19 DIAGNOSIS — Z6833 Body mass index (BMI) 33.0-33.9, adult: Secondary | ICD-10-CM | POA: Diagnosis not present

## 2020-12-19 DIAGNOSIS — N951 Menopausal and female climacteric states: Secondary | ICD-10-CM | POA: Diagnosis not present

## 2020-12-19 DIAGNOSIS — Z124 Encounter for screening for malignant neoplasm of cervix: Secondary | ICD-10-CM | POA: Diagnosis not present

## 2020-12-19 DIAGNOSIS — Z01411 Encounter for gynecological examination (general) (routine) with abnormal findings: Secondary | ICD-10-CM | POA: Diagnosis not present

## 2020-12-19 DIAGNOSIS — Z9013 Acquired absence of bilateral breasts and nipples: Secondary | ICD-10-CM | POA: Diagnosis not present

## 2020-12-19 DIAGNOSIS — L900 Lichen sclerosus et atrophicus: Secondary | ICD-10-CM | POA: Diagnosis not present

## 2020-12-24 ENCOUNTER — Other Ambulatory Visit: Payer: Self-pay | Admitting: Obstetrics & Gynecology

## 2020-12-31 DIAGNOSIS — M8589 Other specified disorders of bone density and structure, multiple sites: Secondary | ICD-10-CM | POA: Diagnosis not present

## 2021-01-21 ENCOUNTER — Encounter (HOSPITAL_COMMUNITY): Payer: Self-pay | Admitting: Physician Assistant

## 2021-01-21 ENCOUNTER — Ambulatory Visit (HOSPITAL_COMMUNITY)
Admission: RE | Admit: 2021-01-21 | Discharge: 2021-01-21 | Disposition: A | Discharge status: Home / Self Care | Payer: Medicare Other | Source: Ambulatory Visit | Attending: Physician Assistant | Admitting: Physician Assistant

## 2021-01-21 ENCOUNTER — Other Ambulatory Visit: Payer: Self-pay

## 2021-01-21 VITALS — BP 132/64 | HR 80 | Ht 62.0 in | Wt 180.8 lb

## 2021-01-21 DIAGNOSIS — E785 Hyperlipidemia, unspecified: Secondary | ICD-10-CM | POA: Diagnosis not present

## 2021-01-21 DIAGNOSIS — I110 Hypertensive heart disease with heart failure: Secondary | ICD-10-CM | POA: Insufficient documentation

## 2021-01-21 DIAGNOSIS — Z79899 Other long term (current) drug therapy: Secondary | ICD-10-CM | POA: Diagnosis not present

## 2021-01-21 DIAGNOSIS — I5022 Chronic systolic (congestive) heart failure: Secondary | ICD-10-CM | POA: Insufficient documentation

## 2021-01-21 DIAGNOSIS — E669 Obesity, unspecified: Secondary | ICD-10-CM | POA: Insufficient documentation

## 2021-01-21 DIAGNOSIS — J45909 Unspecified asthma, uncomplicated: Secondary | ICD-10-CM | POA: Diagnosis not present

## 2021-01-21 DIAGNOSIS — E119 Type 2 diabetes mellitus without complications: Secondary | ICD-10-CM | POA: Insufficient documentation

## 2021-01-21 DIAGNOSIS — Z6833 Body mass index (BMI) 33.0-33.9, adult: Secondary | ICD-10-CM | POA: Diagnosis not present

## 2021-01-21 DIAGNOSIS — I480 Paroxysmal atrial fibrillation: Secondary | ICD-10-CM | POA: Insufficient documentation

## 2021-01-21 DIAGNOSIS — Z7901 Long term (current) use of anticoagulants: Secondary | ICD-10-CM | POA: Insufficient documentation

## 2021-01-21 DIAGNOSIS — D6869 Other thrombophilia: Secondary | ICD-10-CM | POA: Diagnosis not present

## 2021-01-21 DIAGNOSIS — I428 Other cardiomyopathies: Secondary | ICD-10-CM | POA: Insufficient documentation

## 2021-01-21 NOTE — Progress Notes (Signed)
Primary Care Physician: Susy Frizzle, MD Primary Cardiologist: Dr Aundra Dubin Primary Electrophysiologist: none Referring Physician: Dr Milon Score [name] is a 75 y.o. female with a history of HTN, DM, systolic CHF, asthma, HLD, and paroxysmal atrial fibrillation who presents for follow up in the [facility].  The patient was initially diagnosed with atrial fibrillation 12/2014 after presenting with symptoms of increased dyspnea. Her Jodelle Red showed atrial fibrillation. Patient is on Eliquis for a CHADS2VASC score of 6. On 12/21/19, patient reported that she was "feeling funny" and checked her Jodelle Red which showed rapid afib. She took an extra dose of Coreg but her rapid rates persistent and she presented to the ER. She spontaneously converted in the ER. There were no specific triggers that she could identify. She denies alcohol use. She does have snoring and daytime somnolence.   On follow up today, patient reports she has done well since her last visit. She denies any heart racing or palpitations. She denies any bleeding issues on anticoagulation.   Today, she denies symptoms of palpitations, chest pain, shortness of breath, orthopnea, PND, lower extremity edema, dizziness, presyncope, syncope, bleeding, or neurologic sequela. The patient is tolerating medications without difficulties and is otherwise without complaint today.    Atrial Fibrillation Risk Factors:  she does have symptoms or diagnosis of sleep apnea. she does not have a history of rheumatic fever. she does not have a history of alcohol use. The patient does not have a history of early familial atrial fibrillation or other arrhythmias.  she has a BMI of Body mass index is 33.07 kg/m.Marland Kitchen Filed Weights   01/21/21 1032  Weight: 82 kg    Family History  Problem Relation Age of Onset  . Asthma Maternal Grandmother   . Breast cancer Maternal Grandmother        dx. 69s  . Stomach cancer Maternal  Grandmother 60       lots of stomach problems  . Heart attack Paternal Grandfather   . Other Daughter        cyst on ovary; unilateral oophorectomy  . Heart attack Brother   . Cancer Maternal Aunt        unknown type  . Celiac disease Maternal Aunt   . Diabetes Maternal Uncle   . Colon cancer Cousin        dx. 50s-60s  . Colon cancer Paternal Aunt        dx. late 45s  . Lung cancer Paternal Aunt        dx. late 60s-70s; former smoker  . Heart attack Paternal Aunt   . Heart attack Paternal Uncle      Atrial Fibrillation Management history:  Previous antiarrhythmic drugs: none Previous cardioversions: none Previous ablations: none CHADS2VASC score: 6 Anticoagulation history: Eliquis   Past Medical History:  Diagnosis Date  . Allergy history unknown   . Arthritis    oa  . Asthma    takes acolate for  . Breast cancer ([place]) 1999; 2008   hx of bilateral breast cancer  . Breast cancer, left breast ([place]) 10/27/2012   3.2 cm ER positive, Her-2 negative, 1 node positive S/P mastectomy/TTRAM reconstruction 4/08  . Breast cancer, right breast ([place]) 10/27/2012   3.2 cm ER/PR positive, 1 node positive, Her-2 negative Rx w mastectomy, AC -T chemo, followed by aromatase inhibitor x 5 years dx 4/08  . Cancer [facility])    breast CA  . Cataracts, bilateral   . CHF (  congestive heart failure) ([place])   . Dehydration after exertion    after she went berry picking in the summer heat . see ED visit   . Diabetes ([place])   . DM2 (diabetes mellitus, type 2) ([place])    on welchol for  . Dyspnea    with exertion  . Elevated cholesterol   . GERD (gastroesophageal reflux disease)   . Hiatal hernia   . Hypertension   . LBBB (left bundle branch block) 10/27/2012  . Leg cramps   . NICM (nonischemic cardiomyopathy) ([place])    a. Echo:  06/14/12: Poor endocardial definition, possible septal HK, EF 50-55%, normal wall motion, mild LAE ;  b.  [facility] 9/13:  normal cors, EF 35-40%,  c. Echo 7/14: EF 50-55%, mild  LAE  . Other fatigue 01/01/2015  . PONV (postoperative nausea and vomiting)   . Pulmonary embolus ([place]) 2001   after tram flap surgery   Past Surgical History:  Procedure Laterality Date  . DILATION AND CURETTAGE OF UTERUS   59yr ago   x 2  . KNEE CLOSED REDUCTION Right 06/01/2018   Procedure: CLOSED MANIPULATION RIGHT KNEE;  Surgeon: AGaynelle Arabian MD;  Location: [facility];  Service: Orthopedics;  Laterality: Right;  . KNEE CLOSED REDUCTION Left 02/22/2019   Procedure: CLOSED MANIPULATION KNEE;  Surgeon: AGaynelle Arabian MD;  Location: [facility];  Service: Orthopedics;  Laterality: Left;  179m  . KNEE SURGERY  2006   left torn cartilage repair  . LEFT HEART CATHETERIZATION WITH CORONARY ANGIOGRAM N/A 06/15/2012   Procedure: LEFT HEART CATHETERIZATION WITH CORONARY ANGIOGRAM;  Surgeon: MuWellington HampshireMD;  [facility];  Service: Cardiovascular;  Laterality: N/A;  . MASTECTOMY     bilateral  . RECONSTRUCTION BREAST W/ TRAM FLAP Left 2001  . TOTAL KNEE ARTHROPLASTY Right 03/07/2018   Procedure: RIGHT TOTAL KNEE ARTHROPLASTY;  Surgeon: AlGaynelle ArabianMD;  Location: [facility];  Service: Orthopedics;  Laterality: Right;  . TOTAL KNEE ARTHROPLASTY Left 10/31/2018   Procedure: TOTAL KNEE ARTHROPLASTY;  Surgeon: AlGaynelle ArabianMD;  Location: [facility];  Service: Orthopedics;  Laterality: Left;  5072m   Current Outpatient Medications  Medication Sig Dispense Refill  . acetaminophen (TYLENOL) 500 MG tablet Take 1,000 mg by mouth as needed for mild pain or moderate pain.     . aMarland Kitchenoxicillin (AMOXIL) 500 MG capsule For dental appointments    . Ascorbic Acid (VITAMIN C WITH ROSE HIPS) 500 MG tablet Take 500 mg by mouth daily.    . aMarland Kitchenorvastatin (LIPITOR) 20 MG tablet TAKE 2 TABLETS (40 MG TOTAL) BY MOUTH EVERY EVENING. 180 tablet 2  . Blood Glucose Monitoring Suppl (ACCU-CHEK AVIVA PLUS) w/Device KIT Use to check FBS QAM - DX:E11.9 1 kit 1  . Blood Glucose Monitoring Suppl (ONE TOUCH ULTRA 2)  W/DEVICE KIT Check FBS qam - Dx:250.00 and she will need lancets (1 box) , strips (1 bottle = 100) and controls also thanks 1 each 0  . calcium-vitamin D (OSCAL WITH D) 500-200 MG-UNIT TABS tablet calcium carbonate 500 mg-vitamin D3 5 mcg (200 unit) tablet   1 by oral route.    . carvedilol (COREG) 12.5 MG tablet TAKE 1 TABLET IN THE MORNING AND 1.5 TABLETS IN THE EVENING 225 tablet 3  . colesevelam (WELCHOL) 625 MG tablet TAAKE 3 TABLETS BY MOUTH TWICE A DAY WITH A MEAL 540 tablet 3  . DEXILANT 60 MG capsule TAKE ONE CAPSULE BY MOUTH EVERY DAY 90 capsule 3  .  diltiazem (CARDIZEM) 30 MG tablet Take 1 tablet every 4 hours AS NEEDED for AFIB heart rate >100 45 tablet 1  . ELIQUIS 5 MG TABS tablet TAKE 1 TABLET BY MOUTH TWICE A DAY 180 tablet 3  . fluticasone (FLONASE) 50 MCG/ACT nasal spray USE 2 SPRAYS IN EACH NOSTRIL DAILY FOR 3 MONTHS 48 g 3  . Ginkgo Biloba 120 MG TABS Take 120 mg by mouth daily.    . Glucosamine-Chondroitin (COSAMIN DS PO) Take 1 tablet by mouth 2 (two) times a day.    Marland Kitchen glucose blood test strip Use to check BS QAM DX: E11.9 100 each 3  . Lancets (ACCU-CHEK SOFT TOUCH) lancets Use to check BS QAM DX: E11.9 100 each 3  . losartan (COZAAR) 100 MG tablet Take 1 tablet (100 mg total) by mouth daily. 90 tablet 3  . Multiple Vitamin (MULTIVITAMIN WITH MINERALS) TABS tablet Take 1 tablet by mouth daily. Senior Multivitamin Iron Free Formula    . spironolactone (ALDACTONE) 25 MG tablet TAKE 1 TABLET BY MOUTH EVERY DAY 90 tablet 3  . vitamin B-12 (CYANOCOBALAMIN) 1000 MCG tablet Take 1,000 mcg by mouth daily.    Marland Kitchen VITAMIN D, CHOLECALCIFEROL, PO Take by mouth daily.     . zafirlukast (ACCOLATE) 20 MG tablet TAKE 1 TABLET (20 MG TOTAL) BY MOUTH 2 (TWO) TIMES DAILY. 180 tablet 4   No current facility-administered medications for this encounter.    Allergies  Allergen Reactions  . Adhesive [Tape] Other (See Comments)    Skin turns red   . Diphenhydramine Other (See Comments)    Pt  feels wired, hyperactive  . Vicodin [Hydrocodone-Acetaminophen] Nausea And Vomiting    Social History   Socioeconomic History  . Marital status: Married    Spouse name: Not on file  . Number of children: 2  . Years of education: Not on file  . Highest education level: Not on file  Occupational History  . 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 Smoking status: Never Smoker  . Smokeless tobacco: Never Used  Vaping Use  . Vaping Use: Never used  Substance and Sexual Activity  . Alcohol use: No  . Drug use: No  . Sexual activity: Never  Other Topics Concern  . Not on file  Social History Narrative  . Not on file   Social Determinants of Health   Financial Resource Strain: Not on file  Food Insecurity: Not on file  Transportation Needs: Not on file  Physical Activity: Not on file  Stress: Not on file  Social Connections: Not on file  Intimate Partner Violence: Not on file     ROS- All systems are reviewed and negative except as per the HPI above.  Physical Exam: Vitals:   01/21/21 1032  BP: 132/64  Pulse: 80  Weight: 82 kg  Height: 5' 2"  (1.575 m)    GEN- The patient is a well appearing obese elderly female, alert and oriented x 3 today.   HEENT-head normocephalic, atraumatic, sclera clear, conjunctiva pink, hearing intact, trachea midline. Lungs- Clear to ausculation bilaterally, normal work of breathing Heart- Regular rate and rhythm, no murmurs, rubs or gallops  GI- soft, NT, ND, + BS Extremities- no clubbing, cyanosis, or edema MS- no significant deformity or atrophy Skin- no rash or lesion Psych- euthymic mood, full affect Neuro- strength and sensation are intact   Wt Readings from Last 3 Encounters:  01/21/21 82 kg  11/20/20 81.8 kg  10/23/20 81.8 kg    EKG today demonstrates  SR, LBBB Vent. rate 80 BPM PR interval 186 ms QRS duration 154 ms QT/QTcB 416/479 ms  Echo 10/31/19  demonstrated  1. Left ventricular ejection fraction, by visual estimation, is 30 to  35%. The left ventricle has moderately decreased function. There is no  left ventricular hypertrophy.  2. Left ventricular diastolic parameters are consistent with Grade I  diastolic dysfunction (impaired relaxation).  3. The left ventricle demonstrates global hypokinesis.  4. Global right ventricle has normal systolic function.The right  ventricular size is normal. No increase in right ventricular wall  thickness.  5. Left atrial size was normal.  6. Right atrial size was normal.  7. The mitral valve is normal in structure. Trivial mitral valve  regurgitation. No evidence of mitral stenosis.  8. The tricuspid valve is normal in structure.  9. The tricuspid valve is normal in structure. Tricuspid valve  regurgitation is trivial.  10. The aortic valve is normal in structure. Aortic valve regurgitation is  not visualized. No evidence of aortic valve sclerosis or stenosis.  11. The pulmonic valve was grossly normal. Pulmonic valve regurgitation is  trivial.  12. The atrial septum is grossly normal.   Epic records are reviewed at length today  CHA2DS2-VASc Score = 6 The patient's score is based upon: CHF History: Yes HTN History: Yes Age : 4 + Diabetes History: Yes Stroke History: No Vascular Disease History: No Gender: Female   ASSESSMENT AND PLAN: 1. Paroxysmal Atrial Fibrillation (ICD10:  I48.0) The patient's CHA2DS2-VASc score is 6, indicating a 9.7% annual risk of stroke.   Patient appears to be maintaining SR. Continue diltiazem 30 mg PRN q 4 hours for heart racing. Kardia for home monitoring.   Continue Eliquis 5 mg BID Continue Coreg 12.5 mg AM and 18.75 mg PM  2. Secondary Hypercoagulable State (ICD10:  D68.69) The patient is at significant risk for stroke/thromboembolism based upon her CHA2DS2-VASc Score of 6.  Continue Apixaban (Eliquis).   3. Obesity Body mass index  is 33.07 kg/m. Lifestyle modification was discussed and encouraged including regular physical activity and weight reduction.  4. HTN Stable, no changes today.   5. NICM Cardiac MRI showed EF 54% No signs or symptoms of fluid overload.  6. Suspected OSA/snoring/daytime somnolence  Patient reports no one reached out to her to arrange. Will refer for sleep study.     Follow up with Dr Aundra Dubin per recall. AF clinic in 6 months.    [facility] [address] [place], [place] [address] [phone] 01/21/2021 10:37 AM

## 2021-02-03 ENCOUNTER — Telehealth: Payer: Self-pay | Admitting: Family Medicine

## 2021-02-03 NOTE — Telephone Encounter (Signed)
Copied from [place]. Topic: Medicare AWV >> Feb 03, 2021  1:58 PM Cher Nakai R wrote: Reason for CRM:  No answer unable to leave a message for patient to call back and schedule Medicare Annual Wellness Visit (AWV) in office.   If not able to come in office, please offer to do virtually or by telephone.   Last AWV:  02/01/2018  Please schedule at anytime with [facility]-Nurse Health Advisor.  If any questions, please contact me at [phone]

## 2021-03-14 ENCOUNTER — Other Ambulatory Visit: Payer: Self-pay | Admitting: *Deleted

## 2021-03-14 MED ORDER — COLESEVELAM HCL 625 MG PO TABS: ORAL_TABLET | ORAL | 3 refills | Status: DC

## 2021-04-25 DIAGNOSIS — M25552 Pain in left hip: Secondary | ICD-10-CM | POA: Diagnosis not present

## 2021-04-30 ENCOUNTER — Encounter (HOSPITAL_COMMUNITY): Payer: Medicare Other | Admitting: Cardiology

## 2021-05-01 ENCOUNTER — Telehealth: Payer: Self-pay

## 2021-05-01 MED ORDER — NIRMATRELVIR/RITONAVIR (PAXLOVID) TABLET (RENAL DOSING): 2.0000 | ORAL_TABLET | Freq: Two times a day (BID) | ORAL | 0 refills | Status: AC

## 2021-05-01 NOTE — Telephone Encounter (Signed)
Pt called in stating that her husband has covid and pt has now tested positive for covids herself. Pt stated that dr prescribed meds for her husband to take, and would like to see if dr would prescribe same med for her to be sent to her pharmacy. Please advise.  Cb#:  [phone]

## 2021-05-01 NOTE — Telephone Encounter (Signed)
Patient tested positive for COVID on 04/30/2021 with home test.   Sx began on 04/26/2021 and include sore throat, cough, congestion.  Advised to continue symptom management with OTC medications: Tylenol/ Ibuprofen for fever/ body aches, Mucinex/ Delsym for cough/ chest congestion, Afrin/Sudafed/nasal saline for sinus pressure/ nasal congestion  GFR 56. Order for Paxlovid sent to pharmacy. Advised possible SE include nausea, diarrhea, loss of taste and hypertension.  If chest pain, shortness of breath, fever >104 that is unresponsive to antipyretics noted, or if patient cannot tolerate fluids, advised to go to ER for evaluation.   Advised that the CDC recommends the following criteria prior to ending isolation in vaccinated persons:  at least 5 days since symptoms onset, then use masking for an additional 5 days.  AND 3 days fever free without antipyretics (Tylenol or Ibuprofen) AND improvement in respiratory symptoms.

## 2021-06-07 ENCOUNTER — Other Ambulatory Visit: Payer: Self-pay | Admitting: Family Medicine

## 2021-06-26 ENCOUNTER — Other Ambulatory Visit: Payer: Self-pay

## 2021-06-26 ENCOUNTER — Encounter (HOSPITAL_COMMUNITY): Payer: Self-pay | Admitting: Cardiology

## 2021-06-26 ENCOUNTER — Other Ambulatory Visit (HOSPITAL_COMMUNITY): Payer: Self-pay

## 2021-06-26 ENCOUNTER — Ambulatory Visit (HOSPITAL_COMMUNITY)
Admission: RE | Admit: 2021-06-26 | Discharge: 2021-06-26 | Disposition: A | Discharge status: Home / Self Care | Payer: Medicare Other | Source: Ambulatory Visit | Attending: Cardiology | Admitting: Cardiology

## 2021-06-26 VITALS — BP 130/70 | HR 70 | Wt 179.4 lb

## 2021-06-26 DIAGNOSIS — Z79899 Other long term (current) drug therapy: Secondary | ICD-10-CM | POA: Insufficient documentation

## 2021-06-26 DIAGNOSIS — E119 Type 2 diabetes mellitus without complications: Secondary | ICD-10-CM | POA: Diagnosis not present

## 2021-06-26 DIAGNOSIS — I110 Hypertensive heart disease with heart failure: Secondary | ICD-10-CM | POA: Insufficient documentation

## 2021-06-26 DIAGNOSIS — I428 Other cardiomyopathies: Secondary | ICD-10-CM

## 2021-06-26 DIAGNOSIS — Z8616 Personal history of COVID-19: Secondary | ICD-10-CM | POA: Diagnosis not present

## 2021-06-26 DIAGNOSIS — E785 Hyperlipidemia, unspecified: Secondary | ICD-10-CM

## 2021-06-26 DIAGNOSIS — I447 Left bundle-branch block, unspecified: Secondary | ICD-10-CM | POA: Insufficient documentation

## 2021-06-26 DIAGNOSIS — Z7901 Long term (current) use of anticoagulants: Secondary | ICD-10-CM | POA: Diagnosis not present

## 2021-06-26 DIAGNOSIS — I509 Heart failure, unspecified: Secondary | ICD-10-CM | POA: Insufficient documentation

## 2021-06-26 DIAGNOSIS — Z7984 Long term (current) use of oral hypoglycemic drugs: Secondary | ICD-10-CM | POA: Insufficient documentation

## 2021-06-26 DIAGNOSIS — I480 Paroxysmal atrial fibrillation: Secondary | ICD-10-CM

## 2021-06-26 LAB — CBC
HCT: 33.4 % — ABNORMAL LOW (ref 36.0–46.0)
Hemoglobin: 10.9 g/dL — ABNORMAL LOW (ref 12.0–15.0)
MCH: 30.4 pg (ref 26.0–34.0)
MCHC: 32.6 g/dL (ref 30.0–36.0)
MCV: 93.3 fL (ref 80.0–100.0)
Platelets: 297 10*3/uL (ref 150–400)
RBC: 3.58 MIL/uL — ABNORMAL LOW (ref 3.87–5.11)
RDW: 13.5 % (ref 11.5–15.5)
WBC: 6 10*3/uL (ref 4.0–10.5)
nRBC: 0 % (ref 0.0–0.2)

## 2021-06-26 LAB — LIPID PANEL
Cholesterol: 97 mg/dL (ref 0–200)
HDL: 41 mg/dL (ref >40)
LDL Cholesterol: 34 mg/dL (ref 0–99)
Total CHOL/HDL Ratio: 2.4 RATIO
Triglycerides: 112 mg/dL (ref <150)
VLDL: 22 mg/dL (ref 0–40)

## 2021-06-26 LAB — BASIC METABOLIC PANEL
Anion gap: 7 (ref 5–15)
BUN: 22 mg/dL (ref 8–23)
CO2: 24 mmol/L (ref 22–32)
Calcium: 9.5 mg/dL (ref 8.9–10.3)
Chloride: 106 mmol/L (ref 98–111)
Creatinine, Ser: 0.87 mg/dL (ref 0.44–1.00)
GFR, Estimated: >60 mL/min (ref >60)
Glucose, Bld: 104 mg/dL — ABNORMAL HIGH (ref 70–99)
Potassium: 4.5 mmol/L (ref 3.5–5.1)
Sodium: 137 mmol/L (ref 135–145)

## 2021-06-26 LAB — BRAIN NATRIURETIC PEPTIDE: B Natriuretic Peptide: 57.8 pg/mL (ref 0.0–100.0)

## 2021-06-26 MED ORDER — DAPAGLIFLOZIN PROPANEDIOL 10 MG PO TABS: 10.0000 mg | ORAL_TABLET | Freq: Every day | ORAL | 11 refills | Status: DC

## 2021-06-26 NOTE — Patient Instructions (Addendum)
EKG done today.  Labs done today. We will contact you only if your labs are abnormal.  START Farxiga 10mg  (1 tablet) by mouth daily.  No other medication changes were made. Please continue all current medications as prescribed.  Your physician recommends that you schedule a follow-up appointment in: 10 days for a lab only appointment and in 3 months with an echo prior to your exam.  If you have any questions or concerns before your next appointment please send [place] a message through [place] or call our office at [phone].    TO LEAVE A MESSAGE FOR THE NURSE SELECT OPTION 2, PLEASE LEAVE A MESSAGE INCLUDING: YOUR NAME DATE OF BIRTH CALL BACK NUMBER REASON FOR CALL**this is important as we prioritize the call backs  YOU WILL RECEIVE A CALL BACK THE SAME DAY AS LONG AS YOU CALL BEFORE 4:00 PM   Do the following things EVERYDAY: Weigh yourself in the morning before breakfast. Write it down and keep it in a log. Take your medicines as prescribed Eat low salt foods--Limit salt (sodium) to 2000 mg per day.  Stay as active as you can everyday Limit all fluids for the day to less than 2 liters   At the [facility], you and your health needs are our priority. As part of our continuing mission to provide you with exceptional heart care, we have created designated Provider Care Teams. These Care Teams include your primary Cardiologist (physician) and Advanced Practice Providers (APPs- Physician Assistants and Nurse Practitioners) who all work together to provide you with the care you need, when you need it.   You may see any of the following providers on your designated Care Team at your next follow up: Dr Glori Bickers Dr Haynes Kerns, NP Lyda Jester, [place] Audry Riles, PharmD   Please be sure to bring in all your medications bottles to every appointment.

## 2021-06-28 NOTE — Progress Notes (Signed)
Patient ID: [name], female   DOB: 03-28-1946, 75 y.o.   MRN: [id number]    Advanced Heart Failure Clinic Note   PCP: Dr. Dennard Schaumann HF: Dr. Aundra Dubin   75 y.o. with history of HTN and type II diabetes presents for followup of CHF.  Back in 2009, she had an echo with normal EF.  She was admitted in 9/13 with chest pain and LBBB.  She had LHC showing no CAD but EF 35-40%.  Echo was a technically difficult study with EF reported as 50-55%.  She has been taking ARB and Coreg.  Repeat echo in 12/13 showed EF 40% with septal hypokinesis.  Cardiac MRI was done in 12/13 as well, with calculated EF 50%, global hypokinesis, and no delayed enhancement.  Echo in 7/15 showed EF 55-60% with mild MR and mild TR (no LBBB at this time).   In 3/16, she began to develop increased dyspnea.  She was seen by Truitt Merle and was noted to have a LBBB again. Echo in 3/16 showed EF down to 35-40%.  She was then seen in followup by Rosaria Ferries.  Alivecor on her phone showed a period of HR up to 160s that appeared to be atrial fibrillation.  She felt tachypalpitations. Coreg was increased and she was started on apixaban 5 mg bid. CPX in 4/16 showed a submaximal effort but normal spirometry and normal peak VO2.  She did have ST changes possibly concerning for ischemia (but has baseline LBBB so hard to interpret).    Cardiolite in 5/16 showed on ischemia or infarction.  Most recent echo in 2/17 showed EF 45-50%, diffuse hypokinesis, grade I diastolic dysfunction, normal RV size and systolic function.  Echo 1/18 with EF stable at 45%.  Echo in 4/19 showed EF 50-55% with septal dyssynergy.   Echo in 1/21 showed EF 40-45%, normal RV.  Cardiac MRI was done in 5/21, showing LV EF 54%, septal-lateral dyssynchrony c/w LBBB, RV EF 53%, no LGE.   Patient had COVID-19 in 7/22.   She is in NSR today. No palpitations, does not think she has had any recent episodes of atrial fibrillation.  Generally stable breathing, gets short of breath  with hills and stairs.  Rare atypical chest pain.  No orthopnea/PND.  She has daytime sleepiness but says that she would not be willing to use CPAP.     ECG (personally reviewed): NSR, LBBB 150 msec  Labs (9/13): TSH 4.884 (elevated), proBNP 155 Labs (10/13): K 3.6, creatinine 0.8 Labs (1/14): K 3.7, creatinine 0.8, BNP not elevated, ANA weakly positive, TSH normal Labs (7/14): BNP normal Labs (11/14): K 4.1, creatinine 0.73 Labs (10/15): K 4.2, creatinine 0.73, LDL 45 Labs (3/16): BNP 46, HCT 42.2, TSH normal Labs (4/16): K 4.1, creatinine 0.78 => 0.8, HCT 39.3 Labs (1/17): K 4.6, creatinine 1.06, hgb 13.1, BNP 22 Labs (10/17): LDL 49, HDL 37, K 4.8, creatinine 0.92, TSH normal, BNP 62 Labs (4/18): LDL 48, K 4.4, creatinine 0.91 Labs (7/19): K 4.3, creatinine 1.02, 11.7  Labs (6/20): K 4.8, creatinine 0.94 Labs (8/21): K 4.8, creatinine 0.91, LDL 42 Labs (1/22): K 4.5, creaitnine 1.04  PMH: 1. LBBB/IVCD: Now persistent.   2. Breast cancer s/p bilateral mastectomy. 3. GERD 4. Type II diabetes mellitus 5. HTN 6. Asthma 7. PE in 2001 8. Hyperlipidemia 9. Nonischemic Cardiomyopathy: Possible LBBB cardiomyopathy. Echo 8/09 with 60%.  Admitted in 9/13 with chest pain.  LHC showed no angiographic CAD and EF 35-40%.  Echo at that  time was read as showing EF 50-55% but was a technically difficult study.  Echo (12/13): EF 40% with septal hypokinesis, normal RV size and systolic function, no significant valvular abnormalities.  Cardiac MRI (12/13): EF 50%, global hypokinesis, no delayed enhancement.  Echo (7/14) with EF 50-55%, mild LV dilation, mild LAE.  Echo (7/15) with EF 55-60%, mild MR and mild TR.  Echo (3/16) with EF 35-40%, septal-lateral dyssynchrony.  CPX (4/16) with RER 0.98 (submaximal), peak VO2 19.5, VE/VCO2 slope 29.4 => normal spirometry, normal peak VO2, but ST changes suggest possibility of ischemia.  Lexiscan Cardiolite (5/16) with no ischemia/infarction. - Echo (2/17) with EF  45-50%, diffuse hypokinesis, grade I diastolic dysfunction, normal RV size and systolic function.  - Echo (1/18) with EF 45%, septal-lateral dyssynchrony, septal hypokinesis, normal RV size and systolic function.  - Echo (4/19) with EF 50-55%, mild LVH, septal-lateral dyssynchony.  - Cardiac MRI (5/21): LV EF 54%, septal-lateral dyssynchrony c/w LBBB, RV EF 53%, no LGE.  10. Supraclavicular lymphadenopathy: Stable by CT back to 2009, suspect benign.  11. Atrial fibrillation: Paroxysmal.  Holter (4/16) with rare PACs and PVCs, short run 5 beats SVT.  - Atrial fibrillation transiently in 1/21.  12. Right TKR 6/19, left TKR 1/20 13. COVID-19 7/22  SH: Married, lives in [facility], never smoked.  FH: Uncle with ? SCD.  Brother with ? SCD.    ROS: All systems reviewed and negative except as per HPI.   Current Outpatient Medications  Medication Sig Dispense Refill   ACCU-CHEK AVIVA PLUS test strip USE TO CHECK BLOOD SUGAR EVERY MORNING DX: E11.9 100 strip 3   acetaminophen (TYLENOL) 500 MG tablet Take 1,000 mg by mouth as needed for mild pain or moderate pain.      amoxicillin (AMOXIL) 500 MG capsule For dental appointments     Ascorbic Acid (VITAMIN C WITH ROSE HIPS) 500 MG tablet Take 500 mg by mouth daily.     atorvastatin (LIPITOR) 20 MG tablet TAKE 2 TABLETS (40 MG TOTAL) BY MOUTH EVERY EVENING. 180 tablet 2   Blood Glucose Monitoring Suppl (ACCU-CHEK AVIVA PLUS) w/Device KIT Use to check FBS QAM - DX:E11.9 1 kit 1   Blood Glucose Monitoring Suppl (ONE TOUCH ULTRA 2) W/DEVICE KIT Check FBS qam - Dx:250.00 and she will need lancets (1 box) , strips (1 bottle = 100) and controls also thanks 1 each 0   calcium-vitamin D (OSCAL WITH D) 500-200 MG-UNIT TABS tablet Take 1 tablet by mouth daily.     carvedilol (COREG) 12.5 MG tablet TAKE 1 TABLET IN THE MORNING AND 1.5 TABLETS IN THE EVENING 225 tablet 3   colesevelam (WELCHOL) 625 MG tablet TAAKE 3 TABLETS BY MOUTH TWICE A DAY WITH  A MEAL 540 tablet 3   dapagliflozin propanediol (FARXIGA) 10 MG TABS tablet Take 1 tablet (10 mg total) by mouth daily before breakfast. 30 tablet 11   DEXILANT 60 MG capsule TAKE ONE CAPSULE BY MOUTH EVERY DAY 90 capsule 3   diltiazem (CARDIZEM) 30 MG tablet TAKE 1 TABLET EVERY 4 HOURS AS NEEDED FOR AFIB HEART RATE >100 45 tablet 1   ELIQUIS 5 MG TABS tablet TAKE 1 TABLET BY MOUTH TWICE A DAY 180 tablet 3   fluticasone (FLONASE) 50 MCG/ACT nasal spray Place into both nostrils as needed for allergies or rhinitis.     Ginkgo Biloba 120 MG TABS Take 120 mg by mouth daily.     Glucosamine-Chondroitin (COSAMIN DS PO) Take 1 tablet  by mouth 2 (two) times a day.     Lancets (ACCU-CHEK SOFT TOUCH) lancets Use to check BS QAM DX: E11.9 100 each 3   losartan (COZAAR) 100 MG tablet Take 1 tablet (100 mg total) by mouth daily. 90 tablet 3   Multiple Vitamin (MULTIVITAMIN WITH MINERALS) TABS tablet Take 1 tablet by mouth daily. Senior Multivitamin Iron Free Formula     spironolactone (ALDACTONE) 25 MG tablet TAKE 1 TABLET BY MOUTH EVERY DAY 90 tablet 3   vitamin B-12 (CYANOCOBALAMIN) 1000 MCG tablet Take 1,000 mcg by mouth daily.     VITAMIN D, CHOLECALCIFEROL, PO Take by mouth daily.      zafirlukast (ACCOLATE) 20 MG tablet TAKE 1 TABLET (20 MG TOTAL) BY MOUTH 2 (TWO) TIMES DAILY. 180 tablet 4   No current facility-administered medications for this encounter.    BP 130/70   Pulse 70   Wt 81.4 kg (179 lb 6.4 oz)   SpO2 97%   BMI 32.81 kg/m    Wt Readings from Last 3 Encounters:  06/26/21 81.4 kg (179 lb 6.4 oz)  01/21/21 82 kg (180 lb 12.8 oz)  11/20/20 81.8 kg (180 lb 6.4 oz)   General: NAD Neck: No JVD, no thyromegaly or thyroid nodule.  Lungs: Clear to auscultation bilaterally with normal respiratory effort. CV: Nondisplaced PMI.  Heart regular S1/S2, no S3/S4, no murmur.  No peripheral edema.  No carotid bruit.  Normal pedal pulses.  Abdomen: Soft, nontender, no hepatosplenomegaly, no  distention.  Skin: Intact without lesions or rashes.  Neurologic: Alert and oriented x 3.  Psych: Normal affect. Extremities: No clubbing or cyanosis.  HEENT: Normal.   Assessment/Plan: 1. Nonischemic cardiomyopathy:   No CAD on cath 9/13 but EF 35-40% at that time.  ECG showed LBBB.  LBBB resolved and EF normalized.  However, in 3/16, patient had fatigue and dyspnea => repeat echo with EF back down to 35-40%, she now has a persistent LBBB. This may be a LBBB cardiomyopathy.  CPX in 4/16 was submaximal but showed normal spirometry and actually near normal peak VO2.  Echo in 4/19 showed EF stable at 50-55% with septal-lateral dyssynchrony.  Cardiac MRI in 5/21 showed LV EF 54%, septal-lateral dyssynchrony c/w LBBB, RV EF 53%, no LGE.  NYHA class II symptoms. Volume status stable on exam. - Continue Coreg 12.5 qam/18.75 qpm (she cannot tolerate 18.75 bid due to dizziness/fatigue).   - Continue losartan 100 mg daily.  - Continue spironolactone 25 mg daily. BMET/BNP today.  - I will have her start Farxiga 10 mg daily or Jardiance 10 mg daily (depending what is covered by her insurance).  BMET in 10 days.  - Repeat echo at followup in 3 months.  2. Hyperlipidemia:  Continue atorvastatin 40 mg qhs, good lipids in 8/21. 3. Atrial fibrillation: Paroxysmal.  NSR today. No recent symptoms.  - Continue Eliquis 5 mg bid.  CBC today.  - If atrial fibrillation becomes more frequent, could consider an anti-arrhythmic (dronedarone or flecainide).  4. LBBB: Persistent and may be cause of mild cardiomyopathy.  5. OSA: Suspected but she says that she would not use CPAP.   Followup in 3 months with echo.   Loralie Champagne 06/28/2021

## 2021-07-01 DIAGNOSIS — Z23 Encounter for immunization: Secondary | ICD-10-CM | POA: Diagnosis not present

## 2021-07-04 ENCOUNTER — Ambulatory Visit (INDEPENDENT_AMBULATORY_CARE_PROVIDER_SITE_OTHER): Payer: Medicare Other

## 2021-07-04 ENCOUNTER — Other Ambulatory Visit: Payer: Self-pay

## 2021-07-04 VITALS — Ht 62.0 in | Wt 180.0 lb

## 2021-07-04 DIAGNOSIS — H9190 Unspecified hearing loss, unspecified ear: Secondary | ICD-10-CM

## 2021-07-04 DIAGNOSIS — Z0000 Encounter for general adult medical examination without abnormal findings: Secondary | ICD-10-CM

## 2021-07-04 NOTE — Patient Instructions (Signed)
[name] , Thank you for taking time to come for your Medicare Wellness Visit. I appreciate your ongoing commitment to your health goals. Please review the following plan we discussed and let me know if I can assist you in the future.   Screening recommendations/referrals: Colonoscopy: Done 10/18/2012 Repeat in 10 years  Mammogram: Done 05/28/2014. Repeat annually  Bone Density: Done 12/31/2020 Repeat every 2 years  Recommended yearly ophthalmology/optometry visit for glaucoma screening and checkup Recommended yearly dental visit for hygiene and checkup  Vaccinations: Influenza vaccine: Done 07/01/21  Repeat annually  Pneumococcal vaccine: Done 01/26/14 and 08/12/2017 Tdap vaccine: Done 01/28/2017 Repeat in 10 years  Shingles vaccine: Zoster 10/05/2008 Shingrix discussed. Please contact your pharmacy for coverage information.     Covid-19:Done 10/16/19, 12/03/19, 08/20/20, 04/30/21  Advanced directives: Please bring a copy of your health care power of attorney and living will to the office to be added to your chart at your convenience.   Conditions/risks identified: Aim for 30 minutes of exercise or walking each day, drink 6-8 glasses of water and eat lots of fruits and vegetables.   Next appointment: Follow up in one year for your annual wellness visit 2023   Preventive Care 77 Years and Older, Female Preventive care refers to lifestyle choices and visits with your health care provider that can promote health and wellness. What does preventive care include? A yearly physical exam. This is also called an annual well check. Dental exams once or twice a year. Routine eye exams. Ask your health care provider how often you should have your eyes checked. Personal lifestyle choices, including: Daily care of your teeth and gums. Regular physical activity. Eating a healthy diet. Avoiding tobacco and drug use. Limiting alcohol use. Practicing safe sex. Taking low-dose aspirin every  day. Taking vitamin and mineral supplements as recommended by your health care provider. What happens during an annual well check? The services and screenings done by your health care provider during your annual well check will depend on your age, overall health, lifestyle risk factors, and family history of disease. Counseling  Your health care provider may ask you questions about your: Alcohol use. Tobacco use. Drug use. Emotional well-being. Home and relationship well-being. Sexual activity. Eating habits. History of falls. Memory and ability to understand (cognition). Work and work Statistician. Reproductive health. Screening  You may have the following tests or measurements: Height, weight, and BMI. Blood pressure. Lipid and cholesterol levels. These may be checked every 5 years, or more frequently if you are over 66 years old. Skin check. Lung cancer screening. You may have this screening every year starting at age 50 if you have a 30-pack-year history of smoking and currently smoke or have quit within the past 15 years. Fecal occult blood test (FOBT) of the stool. You may have this test every year starting at age 20. Flexible sigmoidoscopy or colonoscopy. You may have a sigmoidoscopy every 5 years or a colonoscopy every 10 years starting at age 59. Hepatitis C blood test. Hepatitis B blood test. Sexually transmitted disease (STD) testing. Diabetes screening. This is done by checking your blood sugar (glucose) after you have not eaten for a while (fasting). You may have this done every 1-3 years. Bone density scan. This is done to screen for osteoporosis. You may have this done starting at age 102. Mammogram. This may be done every 1-2 years. Talk to your health care provider about how often you should have regular mammograms. Talk with your health care provider  about your test results, treatment options, and if necessary, the need for more tests. Vaccines  Your health care  provider may recommend certain vaccines, such as: Influenza vaccine. This is recommended every year. Tetanus, diphtheria, and acellular pertussis (Tdap, Td) vaccine. You may need a Td booster every 10 years. Zoster vaccine. You may need this after age 12. Pneumococcal 13-valent conjugate (PCV13) vaccine. One dose is recommended after age 66. Pneumococcal polysaccharide (PPSV23) vaccine. One dose is recommended after age 74. Talk to your health care provider about which screenings and vaccines you need and how often you need them. This information is not intended to replace advice given to you by your health care provider. Make sure you discuss any questions you have with your health care provider. Document Released: 10/18/2015 Document Revised: 06/10/2016 Document Reviewed: 07/23/2015 Elsevier Interactive Patient Education  2017 [place] Prevention in the Home Falls can cause injuries. They can happen to people of all ages. There are many things you can do to make your home safe and to help prevent falls. What can I do on the outside of my home? Regularly fix the edges of walkways and driveways and fix any cracks. Remove anything that might make you trip as you walk through a door, such as a raised step or threshold. Trim any bushes or trees on the path to your home. Use bright outdoor lighting. Clear any walking paths of anything that might make someone trip, such as rocks or tools. Regularly check to see if handrails are loose or broken. Make sure that both sides of any steps have handrails. Any raised decks and porches should have guardrails on the edges. Have any leaves, snow, or ice cleared regularly. Use sand or salt on walking paths during winter. Clean up any spills in your garage right away. This includes oil or grease spills. What can I do in the bathroom? Use night lights. Install grab bars by the toilet and in the tub and shower. Do not use towel bars as grab  bars. Use non-skid mats or decals in the tub or shower. If you need to sit down in the shower, use a plastic, non-slip stool. Keep the floor dry. Clean up any water that spills on the floor as soon as it happens. Remove soap buildup in the tub or shower regularly. Attach bath mats securely with double-sided non-slip rug tape. Do not have throw rugs and other things on the floor that can make you trip. What can I do in the bedroom? Use night lights. Make sure that you have a light by your bed that is easy to reach. Do not use any sheets or blankets that are too big for your bed. They should not hang down onto the floor. Have a firm chair that has side arms. You can use this for support while you get dressed. Do not have throw rugs and other things on the floor that can make you trip. What can I do in the kitchen? Clean up any spills right away. Avoid walking on wet floors. Keep items that you use a lot in easy-to-reach places. If you need to reach something above you, use a strong step stool that has a grab bar. Keep electrical cords out of the way. Do not use floor polish or wax that makes floors slippery. If you must use wax, use non-skid floor wax. Do not have throw rugs and other things on the floor that can make you trip. What can I do  with my stairs? Do not leave any items on the stairs. Make sure that there are handrails on both sides of the stairs and use them. Fix handrails that are broken or loose. Make sure that handrails are as long as the stairways. Check any carpeting to make sure that it is firmly attached to the stairs. Fix any carpet that is loose or worn. Avoid having throw rugs at the top or bottom of the stairs. If you do have throw rugs, attach them to the floor with carpet tape. Make sure that you have a light switch at the top of the stairs and the bottom of the stairs. If you do not have them, ask someone to add them for you. What else can I do to help prevent  falls? Wear shoes that: Do not have high heels. Have rubber bottoms. Are comfortable and fit you well. Are closed at the toe. Do not wear sandals. If you use a stepladder: Make sure that it is fully opened. Do not climb a closed stepladder. Make sure that both sides of the stepladder are locked into place. Ask someone to hold it for you, if possible. Clearly mark and make sure that you can see: Any grab bars or handrails. First and last steps. Where the edge of each step is. Use tools that help you move around (mobility aids) if they are needed. These include: Canes. Walkers. Scooters. Crutches. Turn on the lights when you go into a dark area. Replace any light bulbs as soon as they burn out. Set up your furniture so you have a clear path. Avoid moving your furniture around. If any of your floors are uneven, fix them. If there are any pets around you, be aware of where they are. Review your medicines with your doctor. Some medicines can make you feel dizzy. This can increase your chance of falling. Ask your doctor what other things that you can do to help prevent falls. This information is not intended to replace advice given to you by your health care provider. Make sure you discuss any questions you have with your health care provider. Document Released: 07/18/2009 Document Revised: 02/27/2016 Document Reviewed: 10/26/2014 Elsevier Interactive Patient Education  2017 [employer].

## 2021-07-04 NOTE — Progress Notes (Signed)
Subjective:   [name] is a 75 y.o. female who presents for an Initial Medicare Annual Wellness Visit. Virtual Visit via Telephone Note  I connected with  [name] on 07/04/21 at  2:00 PM EDT by telephone and verified that I am speaking with the correct person using two identifiers.  Location: Patient: Home Provider: [facility] Persons participating in the virtual visit: patient/Nurse Health Advisor   I discussed the limitations, risks, security and privacy concerns of performing an evaluation and management service by telephone and the availability of in person appointments. The patient expressed understanding and agreed to proceed.  Interactive audio and video telecommunications were attempted between this nurse and patient, however failed, due to patient having technical difficulties OR patient did not have access to video capability.  We continued and completed visit with audio only.  Some vital signs may be absent or patient reported.   Chriss Driver, LPN  Review of Systems     Cardiac Risk Factors include: advanced age (>69mn, >>30women);diabetes mellitus;dyslipidemia;hypertension;obesity (BMI >30kg/m2);sedentary lifestyle     Objective:    Today's Vitals   07/04/21 1346  Weight: 180 lb (81.6 kg)  Height: _0  (1.575 m)   Body mass index is 32.92 kg/m.  Advanced Directives 07/04/2021 12/21/2019 02/20/2019 11/21/2018 10/31/2018 10/25/2018 06/01/2018  Does Patient Have a Medical Advance Directive? Yes No No Yes Yes Yes Yes  Type of AParamedicof ADeep RunLiving will - - HSpecial educational needs teacherof AIonaof APulask[place]Living will  Does patient want to make changes to medical advance directive? - - - - No - Patient declined No - Patient declined -  Copy of [place] Chart? No - copy requested - - No - copy requested - No - copy requested No - copy  requested  Would patient like information on creating a medical advance directive? - - No - Patient declined - - - -  Pre-existing out of facility DNR order (yellow form or pink MOST form) - - - - - - -    Current Medications (verified) Outpatient Encounter Medications as of 07/04/2021  Medication Sig   ACCU-CHEK AVIVA PLUS test strip USE TO CHECK BLOOD SUGAR EVERY MORNING DX: E11.9   acetaminophen (TYLENOL) 500 MG tablet Take 1,000 mg by mouth as needed for mild pain or moderate pain.    amoxicillin (AMOXIL) 500 MG capsule For dental appointments   Ascorbic Acid (VITAMIN C WITH ROSE HIPS) 500 MG tablet Take 500 mg by mouth daily.   atorvastatin (LIPITOR) 20 MG tablet TAKE 2 TABLETS (40 MG TOTAL) BY MOUTH EVERY EVENING.   Blood Glucose Monitoring Suppl (ACCU-CHEK AVIVA PLUS) w/Device KIT Use to check FBS QAM - DX:E11.9   Blood Glucose Monitoring Suppl (ONE TOUCH ULTRA 2) W/DEVICE KIT Check FBS qam - Dx:250.00 and she will need lancets (1 box) , strips (1 bottle = 100) and controls also thanks   calcium-vitamin D (OSCAL WITH D) 500-200 MG-UNIT TABS tablet Take 1 tablet by mouth daily.   carvedilol (COREG) 12.5 MG tablet TAKE 1 TABLET IN THE MORNING AND 1.5 TABLETS IN THE EVENING   clobetasol ointment (TEMOVATE) 0.05 % SMARTSIG:Sparingly Topical Twice Daily   colesevelam (WELCHOL) 625 MG tablet TAAKE 3 TABLETS BY MOUTH TWICE A DAY WITH A MEAL   dapagliflozin propanediol (FARXIGA) 10 MG TABS tablet Take 1 tablet (10 mg total) by mouth daily before breakfast.  DEXILANT 60 MG capsule TAKE ONE CAPSULE BY MOUTH EVERY DAY   diltiazem (CARDIZEM) 30 MG tablet TAKE 1 TABLET EVERY 4 HOURS AS NEEDED FOR AFIB HEART RATE >100   ELIQUIS 5 MG TABS tablet TAKE 1 TABLET BY MOUTH TWICE A DAY   fluticasone (FLONASE) 50 MCG/ACT nasal spray Place into both nostrils as needed for allergies or rhinitis.   Ginkgo Biloba 120 MG TABS Take 120 mg by mouth daily.   Glucosamine-Chondroitin (COSAMIN DS PO) Take 1 tablet by  mouth 2 (two) times a day.   Lancets (ACCU-CHEK SOFT TOUCH) lancets Use to check BS QAM DX: E11.9   losartan (COZAAR) 100 MG tablet Take 1 tablet (100 mg total) by mouth daily.   Multiple Vitamin (MULTIVITAMIN WITH MINERALS) TABS tablet Take 1 tablet by mouth daily. Senior Multivitamin Iron Free Formula   spironolactone (ALDACTONE) 25 MG tablet TAKE 1 TABLET BY MOUTH EVERY DAY   vitamin B-12 (CYANOCOBALAMIN) 1000 MCG tablet Take 1,000 mcg by mouth daily.   VITAMIN D, CHOLECALCIFEROL, PO Take by mouth daily.    zafirlukast (ACCOLATE) 20 MG tablet TAKE 1 TABLET (20 MG TOTAL) BY MOUTH 2 (TWO) TIMES DAILY.   No facility-administered encounter medications on file as of 07/04/2021.    Allergies (verified) Adhesive [tape], Diphenhydramine, and Vicodin [hydrocodone-acetaminophen]   History: Past Medical History:  Diagnosis Date   Allergy 2000   Allergy history unknown    Arthritis    oa   Asthma    takes acolate for   Breast cancer ([place]) 1999; 2008   hx of bilateral breast cancer   Breast cancer, left breast ([place]) 10/27/2012   3.2 cm ER positive, Her-2 negative, 1 node positive S/P mastectomy/TTRAM reconstruction 4/08   Breast cancer, right breast ([place]) 10/27/2012   3.2 cm ER/PR positive, 1 node positive, Her-2 negative Rx w mastectomy, AC -T chemo, followed by aromatase inhibitor x 5 years dx 4/08   Cancer ([place])    breast CA   Cataracts, bilateral    CHF (congestive heart failure) (HCC)    Dehydration after exertion    after she went berry picking in the summer heat . see ED visit    Diabetes ([place])    DM2 (diabetes mellitus, type 2) ([place])    on welchol for   Dyspnea    with exertion   Elevated cholesterol    GERD (gastroesophageal reflux disease)    Hiatal hernia    Hypertension    LBBB (left bundle branch block) 10/27/2012   Leg cramps    NICM (nonischemic cardiomyopathy) ([place])    a. Echo:  06/14/12: Poor endocardial definition, possible septal HK, EF 50-55%, normal wall  motion, mild LAE ;  b.  [facility] 9/13:  normal cors, EF 35-40%,  c. Echo 7/14: EF 50-55%, mild LAE   Other fatigue 01/01/2015   PONV (postoperative nausea and vomiting)    Pulmonary embolus ([place]) 2001   after tram flap surgery   Past Surgical History:  Procedure Laterality Date   BREAST SURGERY  2000 &2005   DILATION AND CURETTAGE OF UTERUS   32yr ago   x 2   EYE SURGERY  2020   JOINT REPLACEMENT     KNEE CLOSED REDUCTION Right 06/01/2018   Procedure: CLOSED MANIPULATION RIGHT KNEE;  Surgeon: AGaynelle Arabian MD;  Location: [facility];  Service: Orthopedics;  Laterality: Right;   KNEE CLOSED REDUCTION Left 02/22/2019   Procedure: CLOSED MANIPULATION KNEE;  Surgeon: AGaynelle Arabian MD;  Location: [facility];  Service: Orthopedics;  Laterality: Left;  60mn   KNEE SURGERY  2006   left torn cartilage repair   LEFT HEART CATHETERIZATION WITH CORONARY ANGIOGRAM N/A 06/15/2012   Procedure: LEFT HEART CATHETERIZATION WITH CORONARY ANGIOGRAM;  Surgeon: MWellington Hampshire MD;  [facility];  Service: Cardiovascular;  Laterality: N/A;   MASTECTOMY     bilateral   RECONSTRUCTION BREAST W/ TRAM FLAP Left 2001   TOTAL KNEE ARTHROPLASTY Right 03/07/2018   Procedure: RIGHT TOTAL KNEE ARTHROPLASTY;  Surgeon: AGaynelle Arabian MD;  Location: [facility];  Service: Orthopedics;  Laterality: Right;   TOTAL KNEE ARTHROPLASTY Left 10/31/2018   Procedure: TOTAL KNEE ARTHROPLASTY;  Surgeon: AGaynelle Arabian MD;  Location: [facility];  Service: Orthopedics;  Laterality: Left;  530m   Family History  Problem Relation Age of Onset   Asthma Maternal Grandmother    Breast cancer Maternal Grandmother        dx. 7038s Stomach cancer Maternal Grandmother 60       lots of stomach problems   Heart attack Paternal Grandfather    Other Daughter        cyst on ovary; unilateral oophorectomy   Heart attack Brother    Cancer Maternal Aunt        unknown type   Celiac disease Maternal Aunt    Diabetes Maternal Uncle    Colon  cancer Cousin        dx. 50s-60s   Colon cancer Paternal Aunt        dx. late 6022s Lung cancer Paternal Aunt        dx. late 60s-70s; former smoker   Heart attack Paternal Aunt    Heart attack Paternal Uncle    Social History   Socioeconomic History   Marital status: Married    Spouse name: Dwight   Number of children: 2   Years of education: Not on file   Highest education level: Not on file  Occupational History   Occupation: retired    EmFish farm managerRETIRED    Comment: post ofFurniture conservator/restorerTobacco Use   Smoking status: Never   Smokeless tobacco: Never  Vaping Use   Vaping Use: Never used  Substance and Sexual Activity   Alcohol use: Never   Drug use: Never   Sexual activity: Not Currently    Birth control/protection: None  Other Topics Concern   Not on file  Social History Narrative   1 son and 1 daughter.   Social Determinants of Health   Financial Resource Strain: Low Risk    Difficulty of Paying Living Expenses: Not hard at all  Food Insecurity: No Food Insecurity   Worried About RuCharity fundraisern the Last Year: Never true   RaBriaroaksn the Last Year: Never true  Transportation Needs: No Transportation Needs   Lack of Transportation (Medical): No   Lack of Transportation (Non-Medical): No  Physical Activity: Sufficiently Active   Days of Exercise per Week: 5 days   Minutes of Exercise per Session: 30 min  Stress: No Stress Concern Present   Feeling of Stress : Not at all  Social Connections: Moderately Integrated   Frequency of Communication with Friends and Family: More than three times a week   Frequency of Social Gatherings with Friends and Family: More than three times a week   Attends Religious Services: Never   AcMarine scientistr Organizations: No   Attends ClArchivisteetings:  1 to 4 times per year   Marital Status: Married    Tobacco Counseling Counseling given: Not Answered   Clinical Intake:  Pre-visit  preparation completed: Yes  Pain : No/denies pain     BMI - recorded: 32.92 Nutritional Status: BMI > 30  Obese Nutritional Risks: None Diabetes: Yes  How often do you need to have someone help you when you read instructions, pamphlets, or other written materials from your doctor or pharmacy?: 1 - Never  Diabetic?Nutrition Risk Assessment:  Has the patient had any N/V/D within the last 2 months?  No  Does the patient have any non-healing wounds?  No  Has the patient had any unintentional weight loss or weight gain?  No   Diabetes:  Is the patient diabetic?  Yes  If diabetic, was a CBG obtained today?  No  Did the patient bring in their glucometer from home?  No  How often do you monitor your CBG's? PRN, checks A1C.   Financial Strains and Diabetes Management:  Are you having any financial strains with the device, your supplies or your medication? Yes .  Does the patient want to be seen by Chronic Care Management for management of their diabetes?  No  Would the patient like to be referred to a Nutritionist or for Diabetic Management?  No   Diabetic Exams:  Diabetic Eye Exam: Completed 09/12/2018. Overdue for diabetic eye exam. Pt has been advised about the importance in completing this exam.  Diabetic Foot Exam: Completed 05/23/2020. Pt has been advised about the importance in completing this exam.  Interpreter Needed?: No  Information entered by :: MJ Charnele Semple, LPN   Activities of Daily Living In your present state of health, do you have any difficulty performing the following activities: 07/04/2021  Hearing? N  Vision? N  Difficulty concentrating or making decisions? N  Walking or climbing stairs? N  Dressing or bathing? N  Doing errands, shopping? N  Preparing Food and eating ? N  Using the Toilet? N  In the past six months, have you accidently leaked urine? Y  Comment Occasional.  Do you have problems with loss of bowel control? N  Managing your Medications? N   Managing your Finances? N  Housekeeping or managing your Housekeeping? N  Some recent data might be hidden    Patient Care Team: Susy Frizzle, MD as PCP - General (Family Medicine) Larey Dresser, MD as Consulting Physician (Cardiology) Heath Lark, MD as Consulting Physician (Hematology and Oncology)  Indicate any recent Medical Services you may have received from other than [facility] providers in the past year (date may be approximate).     Assessment:   This is a routine wellness examination for [name].  Hearing/Vision screen Hearing Screening - Comments:: Some hearing issues.  Vision Screening - Comments:: Glasses. Overdue. Dr. Alexandria Lodge in [place].   Dietary issues and exercise activities discussed: Current Exercise Habits: Home exercise routine, Type of exercise: walking, Time (Minutes): 30, Frequency (Times/Week): 5, Weekly Exercise (Minutes/Week): 150, Intensity: Mild, Exercise limited by: cardiac condition(s);respiratory conditions(s)   Goals Addressed             This Visit's Progress    Exercise 3x per week (30 min per time)         Depression Screen PHQ 2/9 Scores 07/04/2021 02/01/2018 12/10/2017 10/04/2017 08/12/2017 01/28/2017 01/28/2017  PHQ - 2 Score 0 0 0 0 0 0 0  PHQ- 9 Score - - - - - 0 -  Fall Risk Fall Risk  07/04/2021 05/03/2019 02/01/2018 12/10/2017 10/04/2017  Falls in the past year? 1 1 Yes No No  Comment - Emmi Telephone Survey: data to providers prior to load - - -  Number falls in past yr: 0 1 1 - -  Comment - Emmi Telephone Survey Actual Response = 2 - - -  Injury with Fall? 0 0 No - -  Risk for fall due to : History of fall(s);Impaired vision - Impaired balance/gait - -  Follow up Falls prevention discussed - - - -    FALL RISK PREVENTION PERTAINING TO THE HOME:  Any stairs in or around the home? No  If so, are there any without handrails? No  Home free of loose throw rugs in walkways, pet beds, electrical cords, etc? Yes  Adequate  lighting in your home to reduce risk of falls? Yes   ASSISTIVE DEVICES UTILIZED TO PREVENT FALLS:  Life alert? No  Use of a cane, walker or w/c? No  Grab bars in the bathroom? Yes  Shower chair or bench in shower? Yes  Elevated toilet seat or a handicapped toilet? Yes   TIMED UP AND GO:  Was the test performed? No . Phone visit.  Cognitive Function:     6CIT Screen 07/04/2021  What Year? 0 points  What month? 0 points  What time? 0 points  Count back from 20 0 points  Months in reverse 0 points  Repeat phrase 0 points  Total Score 0    Immunizations Immunization History  Administered Date(s) Administered   Fluad Quad(high Dose 65+) 06/09/2019   Hep A / Hep B 11/01/2014   Hepatitis A, Ped/Adol-2 Dose 05/24/2014   Influenza Split 08/03/2006, 07/08/2007, 07/10/2010, 06/07/2012, 06/19/2013   Influenza, High Dose Seasonal PF 06/10/2016, 06/16/2017, 07/01/2018, 06/28/2020, 07/01/2021   Influenza,inj,quad, With Preservative 06/05/2017   Influenza-Unspecified 07/18/2015, 06/05/2018   Moderna SARS-COV2 Booster Vaccination 08/20/2020   Moderna Sars-Covid-2 Vaccination 10/16/2019, 11/13/2019   Pneumococcal Conjugate-13 01/26/2014   Pneumococcal Polysaccharide-23 07/08/2007, 10/05/2008, 08/12/2017   Tdap 01/28/2017   Zoster, Live 10/05/2008    TDAP status: Up to date  Flu Vaccine status: Up to date  Pneumococcal vaccine status: Up to date  Covid-19 vaccine status: Completed vaccines  Qualifies for Shingles Vaccine? Yes   Zostavax completed Yes   Shingrix Completed?: No.    Education has been provided regarding the importance of this vaccine. Patient has been advised to call insurance company to determine out of pocket expense if they have not yet received this vaccine. Advised may also receive vaccine at local pharmacy or Health Dept. Verbalized acceptance and understanding.  Screening Tests Health Maintenance  Topic Date Due   Zoster Vaccines- Shingrix (1 of 2) Never  done   OPHTHALMOLOGY EXAM  09/13/2019   COVID-19 Vaccine (4 - Booster for Moderna series) 11/12/2020   HEMOGLOBIN A1C  11/23/2020   FOOT EXAM  05/23/2021   TETANUS/TDAP  01/29/2027   INFLUENZA VACCINE  Completed   DEXA SCAN  Completed   Hepatitis C Screening  Completed   HPV VACCINES  Aged Out    Health Maintenance  Health Maintenance Due  Topic Date Due   Zoster Vaccines- Shingrix (1 of 2) Never done   OPHTHALMOLOGY EXAM  09/13/2019   COVID-19 Vaccine (4 - Booster for Moderna series) 11/12/2020   HEMOGLOBIN A1C  11/23/2020   FOOT EXAM  05/23/2021    Colorectal cancer screening: Type of screening: Colonoscopy. Completed 10/18/2012. Repeat every 10 years  Mammogram status: Completed 05/28/2014. Repeat every year  Bone Density status: Completed 12/31/2020. Results reflect: Bone density results: OSTEOPENIA. Repeat every 2 years.  Lung Cancer Screening: (Low Dose CT Chest recommended if Age 74-80 years, 30 pack-year currently smoking OR have quit w/in 15years.) does not qualify.  Additional Screening:  Hepatitis C Screening: does not qualify; Completed 02/01/2018  Vision Screening: Recommended annual ophthalmology exams for early detection of glaucoma and other disorders of the eye. Is the patient up to date with their annual eye exam?  No  Who is the provider or what is the name of the office in which the patient attends annual eye exams? Dr. Jefm Bryant If pt is not established with a provider, would they like to be referred to a provider to establish care? No .   Dental Screening: Recommended annual dental exams for proper oral hygiene  Community Resource Referral / Chronic Care Management: CRR required this visit?  No   CCM required this visit?  No      Plan:     I have personally reviewed and noted the following in the patient's chart:   Medical and social history Use of alcohol, tobacco or illicit drugs  Current medications and supplements including opioid  prescriptions. Patient is not currently taking opioid prescriptions. Functional ability and status Nutritional status Physical activity Advanced directives List of other physicians Hospitalizations, surgeries, and ER visits in previous 12 months Vitals Screenings to include cognitive, depression, and falls Referrals and appointments  In addition, I have reviewed and discussed with patient certain preventive protocols, quality metrics, and best practice recommendations. A written personalized care plan for preventive services as well as general preventive health recommendations were provided to patient.     Chriss Driver, LPN   04/19/9677   Nurse Notes: Pt states she had an episode of Afib on 07/01/2021 after getting flu vaccine. Pt would also like a referral to an audiologist for hearing issues/loss. Pt states she will call to scheduled eye exam.

## 2021-07-07 ENCOUNTER — Other Ambulatory Visit: Payer: Self-pay

## 2021-07-07 ENCOUNTER — Ambulatory Visit (HOSPITAL_COMMUNITY)
Admission: RE | Admit: 2021-07-07 | Discharge: 2021-07-07 | Disposition: A | Discharge status: Home / Self Care | Payer: Medicare Other | Source: Ambulatory Visit | Attending: Internal Medicine | Admitting: Internal Medicine

## 2021-07-07 DIAGNOSIS — I428 Other cardiomyopathies: Secondary | ICD-10-CM | POA: Diagnosis not present

## 2021-07-07 LAB — BASIC METABOLIC PANEL
Anion gap: 8 (ref 5–15)
BUN: 23 mg/dL (ref 8–23)
CO2: 23 mmol/L (ref 22–32)
Calcium: 9.4 mg/dL (ref 8.9–10.3)
Chloride: 106 mmol/L (ref 98–111)
Creatinine, Ser: 1.01 mg/dL — ABNORMAL HIGH (ref 0.44–1.00)
GFR, Estimated: 57 mL/min — ABNORMAL LOW (ref >60)
Glucose, Bld: 105 mg/dL — ABNORMAL HIGH (ref 70–99)
Potassium: 4.2 mmol/L (ref 3.5–5.1)
Sodium: 137 mmol/L (ref 135–145)

## 2021-07-08 ENCOUNTER — Ambulatory Visit (INDEPENDENT_AMBULATORY_CARE_PROVIDER_SITE_OTHER): Payer: Medicare Other | Admitting: Family Medicine

## 2021-07-08 ENCOUNTER — Encounter: Payer: Self-pay | Admitting: Family Medicine

## 2021-07-08 VITALS — BP 122/64 | HR 80 | Temp 98.3°F | Resp 16 | Ht 62.0 in | Wt 179.0 lb

## 2021-07-08 DIAGNOSIS — L989 Disorder of the skin and subcutaneous tissue, unspecified: Secondary | ICD-10-CM

## 2021-07-08 DIAGNOSIS — E118 Type 2 diabetes mellitus with unspecified complications: Secondary | ICD-10-CM

## 2021-07-08 NOTE — Progress Notes (Signed)
Subjective:    Patient ID: [name], female    DOB: Feb 23, 1946, 75 y.o.   MRN: [id number]  HPI  Patient is here today due to a lesion in the center of her back roughly around the level of T2.  It is an erythematous scaly patch of skin roughly 1 cm in diameter with a hyperkeratotic papule in the center.  Differential diagnosis includes squamous cell carcinoma versus AK versus SK.  Offer the patient a shave biopsy but she declined today.  Instead she would like to try cryotherapy.  She is also long overdue for an A1c Past Medical History:  Diagnosis Date   Allergy 2000   Allergy history unknown    Arthritis    oa   Asthma    takes acolate for   Breast cancer ([place]) 1999; 2008   hx of bilateral breast cancer   Breast cancer, left breast ([place]) 10/27/2012   3.2 cm ER positive, Her-2 negative, 1 node positive S/P mastectomy/TTRAM reconstruction 4/08   Breast cancer, right breast ([place]) 10/27/2012   3.2 cm ER/PR positive, 1 node positive, Her-2 negative Rx w mastectomy, AC -T chemo, followed by aromatase inhibitor x 5 years dx 4/08   Cancer ([place])    breast CA   Cataracts, bilateral    CHF (congestive heart failure) (HCC)    Dehydration after exertion    after she went berry picking in the summer heat . see ED visit    Diabetes ([place])    DM2 (diabetes mellitus, type 2) ([place])    on welchol for   Dyspnea    with exertion   Elevated cholesterol    GERD (gastroesophageal reflux disease)    Hiatal hernia    Hypertension    LBBB (left bundle branch block) 10/27/2012   Leg cramps    NICM (nonischemic cardiomyopathy) ([place])    a. Echo:  06/14/12: Poor endocardial definition, possible septal HK, EF 50-55%, normal wall motion, mild LAE ;  b.  [facility] 9/13:  normal cors, EF 35-40%,  c. Echo 7/14: EF 50-55%, mild LAE   Other fatigue 01/01/2015   PONV (postoperative nausea and vomiting)    Pulmonary embolus ([place]) 2001   after tram flap surgery   Past Surgical History:  Procedure Laterality  Date   BREAST SURGERY  2000 &2005   DILATION AND CURETTAGE OF UTERUS   67yr ago   x 2   EYE SURGERY  2020   JOINT REPLACEMENT     KNEE CLOSED REDUCTION Right 06/01/2018   Procedure: CLOSED MANIPULATION RIGHT KNEE;  Surgeon: AGaynelle Arabian MD;  Location: [facility];  Service: Orthopedics;  Laterality: Right;   KNEE CLOSED REDUCTION Left 02/22/2019   Procedure: CLOSED MANIPULATION KNEE;  Surgeon: AGaynelle Arabian MD;  Location: [facility];  Service: Orthopedics;  Laterality: Left;  146m   KNEE SURGERY  2006   left torn cartilage repair   LEFT HEART CATHETERIZATION WITH CORONARY ANGIOGRAM N/A 06/15/2012   Procedure: LEFT HEART CATHETERIZATION WITH CORONARY ANGIOGRAM;  Surgeon: MuWellington HampshireMD;  [facility];  Service: Cardiovascular;  Laterality: N/A;   MASTECTOMY     bilateral   RECONSTRUCTION BREAST W/ TRAM FLAP Left 2001   TOTAL KNEE ARTHROPLASTY Right 03/07/2018   Procedure: RIGHT TOTAL KNEE ARTHROPLASTY;  Surgeon: AlGaynelle ArabianMD;  Location: [facility];  Service: Orthopedics;  Laterality: Right;   TOTAL KNEE ARTHROPLASTY Left 10/31/2018   Procedure: TOTAL KNEE ARTHROPLASTY;  Surgeon: AlGaynelle ArabianMD;  Location:  [facility];  Service: Orthopedics;  Laterality: Left;  62mn   Current Outpatient Medications on File Prior to Visit  Medication Sig Dispense Refill   ACCU-CHEK AVIVA PLUS test strip USE TO CHECK BLOOD SUGAR EVERY MORNING DX: E11.9 100 strip 3   acetaminophen (TYLENOL) 500 MG tablet Take 1,000 mg by mouth as needed for mild pain or moderate pain.      amoxicillin (AMOXIL) 500 MG capsule For dental appointments     Ascorbic Acid (VITAMIN C WITH ROSE HIPS) 500 MG tablet Take 500 mg by mouth daily.     atorvastatin (LIPITOR) 20 MG tablet TAKE 2 TABLETS (40 MG TOTAL) BY MOUTH EVERY EVENING. 180 tablet 2   Blood Glucose Monitoring Suppl (ACCU-CHEK AVIVA PLUS) w/Device KIT Use to check FBS QAM - DX:E11.9 1 kit 1   Blood Glucose Monitoring Suppl (ONE TOUCH ULTRA 2) W/DEVICE KIT  Check FBS qam - Dx:250.00 and she will need lancets (1 box) , strips (1 bottle = 100) and controls also thanks 1 each 0   calcium-vitamin D (OSCAL WITH D) 500-200 MG-UNIT TABS tablet Take 1 tablet by mouth daily.     carvedilol (COREG) 12.5 MG tablet TAKE 1 TABLET IN THE MORNING AND 1.5 TABLETS IN THE EVENING 225 tablet 3   clobetasol ointment (TEMOVATE) 0.05 % SMARTSIG:Sparingly Topical Twice Daily     colesevelam (WELCHOL) 625 MG tablet TAAKE 3 TABLETS BY MOUTH TWICE A DAY WITH A MEAL 540 tablet 3   dapagliflozin propanediol (FARXIGA) 10 MG TABS tablet Take 1 tablet (10 mg total) by mouth daily before breakfast. 30 tablet 11   DEXILANT 60 MG capsule TAKE ONE CAPSULE BY MOUTH EVERY DAY 90 capsule 3   diltiazem (CARDIZEM) 30 MG tablet TAKE 1 TABLET EVERY 4 HOURS AS NEEDED FOR AFIB HEART RATE >100 45 tablet 1   ELIQUIS 5 MG TABS tablet TAKE 1 TABLET BY MOUTH TWICE A DAY 180 tablet 3   fluticasone (FLONASE) 50 MCG/ACT nasal spray Place into both nostrils as needed for allergies or rhinitis.     Ginkgo Biloba 120 MG TABS Take 120 mg by mouth daily.     Glucosamine-Chondroitin (COSAMIN DS PO) Take 1 tablet by mouth 2 (two) times a day.     Lancets (ACCU-CHEK SOFT TOUCH) lancets Use to check BS QAM DX: E11.9 100 each 3   losartan (COZAAR) 100 MG tablet Take 1 tablet (100 mg total) by mouth daily. 90 tablet 3   Multiple Vitamin (MULTIVITAMIN WITH MINERALS) TABS tablet Take 1 tablet by mouth daily. Senior Multivitamin Iron Free Formula     spironolactone (ALDACTONE) 25 MG tablet TAKE 1 TABLET BY MOUTH EVERY DAY 90 tablet 3   vitamin B-12 (CYANOCOBALAMIN) 1000 MCG tablet Take 1,000 mcg by mouth daily.     VITAMIN D, CHOLECALCIFEROL, PO Take by mouth daily.      zafirlukast (ACCOLATE) 20 MG tablet TAKE 1 TABLET (20 MG TOTAL) BY MOUTH 2 (TWO) TIMES DAILY. 180 tablet 4   No current facility-administered medications on file prior to visit.   Allergies  Allergen Reactions   Adhesive [Tape] Other (See  Comments)    Skin turns red    Diphenhydramine Other (See Comments)    Pt feels wired, hyperactive   Vicodin [Hydrocodone-Acetaminophen] Nausea And Vomiting   Social History   Socioeconomic History   Marital status: Married    Spouse name: DOrpah Greek  Number of children: 2   Years of education: Not on file   Highest  education level: Not on file  Occupational History   Occupation: retired    Fish farm manager: RETIRED    Comment: post Furniture conservator/restorer  Tobacco Use   Smoking status: Never   Smokeless tobacco: Never  Vaping Use   Vaping Use: Never used  Substance and Sexual Activity   Alcohol use: Never   Drug use: Never   Sexual activity: Not Currently    Birth control/protection: None  Other Topics Concern   Not on file  Social History Narrative   1 son and 1 daughter.   Social Determinants of Health   Financial Resource Strain: Low Risk    Difficulty of Paying Living Expenses: Not hard at all  Food Insecurity: No Food Insecurity   Worried About Charity fundraiser in the Last Year: Never true   [place] in the Last Year: Never true  Transportation Needs: No Transportation Needs   Lack of Transportation (Medical): No   Lack of Transportation (Non-Medical): No  Physical Activity: Sufficiently Active   Days of Exercise per Week: 5 days   Minutes of Exercise per Session: 30 min  Stress: No Stress Concern Present   Feeling of Stress : Not at all  Social Connections: Moderately Integrated   Frequency of Communication with Friends and Family: More than three times a week   Frequency of Social Gatherings with Friends and Family: More than three times a week   Attends Religious Services: Never   Marine scientist or Organizations: No   Attends Music therapist: 1 to 4 times per year   Marital Status: Married  Human resources officer Violence: Not At Risk   Fear of Current or Ex-Partner: No   Emotionally Abused: No   Physically Abused: No   Sexually  Abused: No      Review of Systems  All other systems reviewed and are negative.     Objective:   Physical Exam Vitals reviewed.  Constitutional:      General: She is not in acute distress.    Appearance: She is well-developed. She is not diaphoretic.  Cardiovascular:     Rate and Rhythm: Normal rate and regular rhythm.     Heart sounds: Normal heart sounds. No murmur heard.   No friction rub. No gallop.  Pulmonary:     Effort: Pulmonary effort is normal. No respiratory distress.     Breath sounds: Normal breath sounds. No stridor. No wheezing or rales.  Musculoskeletal:       Back:  Neurological:     General: No focal deficit present.     Mental Status: She is oriented to person, place, and time. Mental status is at baseline.          Assessment & Plan:  Controlled type 2 diabetes mellitus with complication, without long-term current use of insulin (HCC) - Plan: Hemoglobin A1c  Skin lesion of back Check hemoglobin A1c today while the patient is here.  The lesion in the center of her back was treated with liquid nitrogen cryotherapy for a total of 30 seconds.  Wound care was discussed.  If persistent I would recommend a shave biopsy

## 2021-07-09 LAB — HEMOGLOBIN A1C
Hgb A1c MFr Bld: 6.4 % of total Hgb — ABNORMAL HIGH (ref <5.7)
Mean Plasma Glucose: 137 mg/dL
eAG (mmol/L): 7.6 mmol/L

## 2021-07-11 DIAGNOSIS — S0502XA Injury of conjunctiva and corneal abrasion without foreign body, left eye, initial encounter: Secondary | ICD-10-CM | POA: Diagnosis not present

## 2021-07-14 ENCOUNTER — Ambulatory Visit: Payer: Medicare Other | Attending: Family Medicine | Admitting: Audiologist

## 2021-07-14 ENCOUNTER — Other Ambulatory Visit: Payer: Self-pay | Admitting: Family Medicine

## 2021-07-14 ENCOUNTER — Other Ambulatory Visit: Payer: Self-pay

## 2021-07-14 DIAGNOSIS — H9190 Unspecified hearing loss, unspecified ear: Secondary | ICD-10-CM

## 2021-07-14 DIAGNOSIS — H918X9 Other specified hearing loss, unspecified ear: Secondary | ICD-10-CM | POA: Diagnosis not present

## 2021-07-14 DIAGNOSIS — H903 Sensorineural hearing loss, bilateral: Secondary | ICD-10-CM | POA: Diagnosis not present

## 2021-07-14 NOTE — Procedures (Signed)
Outpatient Audiology and [place], [place]  [address] [phone]  AUDIOLOGICAL  EVALUATION  NAME: [name]     DOB:   August 27, 1946      MRN: [id number]                                                                                     DATE: 07/14/2021     REFERENT: Susy Frizzle, MD STATUS: Outpatient DIAGNOSIS: Asymmetric Sensorineural Hearing Loss in both Ears   History: [name] was seen for an audiological evaluation.  [name] is receiving a hearing evaluation due to concerns for difficulty understanding people. [name] says she hears ok, she just cannot understand people. She is struggling to hear beeps and alarms on her appliances.  [name] has difficulty hearing in background noise and when people are at a distance. This difficulty began gradually. No pain or pressure reported in either ear. Tinnitus present in both ears sounding like a soft buzz, it comes and goes. [name] has no history of noise exposure. [name] has recently recovered from [place].  Medical history positive for diabetes which is a risk factor for hearing loss. No other relevant case history reported.   Evaluation:  Otoscopy showed a clear view of the tympanic membranes, bilaterally Tympanometry results were consistent with normal middle ear function in each ear Audiometric testing was completed using conventional audiometry with insert transducer then cross checked with supraural. Speech Recognition Thresholds were consistent with pure tone averages, SRT 35dB in right ear and 50dB in left ear. Word Recognition was fair at 40dB SL in each ear with masking. Pure tone thresholds show normal sloping to moderate sensorineural hearing loss in both ears with the left ear having an asymmetric component in moderately severe range 750-3k Hz. Test results are consistent with asymmetric hearing loss in mid frequencies with the left ear worse.   Results:  The test results were reviewed with  [facility]. She has a moderate high pitched hearing loss in both ears. She cannot hear consonants that are high pitched, such as /s/, /sh/ and /t/. These, amongst other sounds, are what makes speech clear. Without these sounds speech sounds muffled like everyone is mumbling. She also has worse hearing in her left ear. She needs to see an ear specialist to help determine any possible cause for the difference between her hearing in the left and right ear. [name] reported understanding. She was given a copy of her audiogram to bring to the ENT. I will also request it be sent with the referral.    Recommendations: Amplification is necessary for both ears. Hearing aids can be purchased from a variety of locations. See provided list for locations in the [place] area.  Referral to ENT Physician necessary due to asymmetric hearing loss. [name] needs medical clearance from ENT Physician due to asymmetric hearing loss.  Susy Frizzle, MD: Please send a referral to otolaryngology provider. Please include this report and audiogram under media with the referral.    [name], Au.D., CCC-A 07/14/2021  1:34 PM  Cc: Susy Frizzle, MD   Outpatient Audiology and [place],  [place] [address]  HEARING AID SERVICES  [facility] [place]  [facility] [address] [place], [place] [address] Appointments: [phone] 0.3 mile away from [facility]  [facility]  [place], [place] [address] Appointments: [phone] 1 mile away from [facility] and [place] [facility] [place] at [facility] and [employer] [place]. [place], [place] [address] Appointments: [phone] 4 miles away from [facility]  [facility] [address], [place], [place] [address] Appointments: [phone] 7 miles away from [facility] and [facility] [address] [place], [place] [address] Appointments: [phone] 11 miles away from [facility] and [place] [place]. [place], [place] [address] Appointments: [phone] 34 miles away from [facility] and [place] [address] [place], [place] [address] Appointments: [phone] 22 miles away from [facility]

## 2021-07-23 ENCOUNTER — Other Ambulatory Visit: Payer: Self-pay

## 2021-07-23 ENCOUNTER — Ambulatory Visit (HOSPITAL_COMMUNITY)
Admission: RE | Admit: 2021-07-23 | Discharge: 2021-07-23 | Disposition: A | Discharge status: Home / Self Care | Payer: Medicare Other | Source: Ambulatory Visit | Attending: Physician Assistant | Admitting: Physician Assistant

## 2021-07-23 ENCOUNTER — Encounter (HOSPITAL_COMMUNITY): Payer: Self-pay | Admitting: Physician Assistant

## 2021-07-23 VITALS — BP 116/72 | HR 74 | Ht 62.0 in | Wt 180.0 lb

## 2021-07-23 DIAGNOSIS — Z7901 Long term (current) use of anticoagulants: Secondary | ICD-10-CM | POA: Insufficient documentation

## 2021-07-23 DIAGNOSIS — Z6832 Body mass index (BMI) 32.0-32.9, adult: Secondary | ICD-10-CM | POA: Insufficient documentation

## 2021-07-23 DIAGNOSIS — E669 Obesity, unspecified: Secondary | ICD-10-CM | POA: Insufficient documentation

## 2021-07-23 DIAGNOSIS — I110 Hypertensive heart disease with heart failure: Secondary | ICD-10-CM | POA: Insufficient documentation

## 2021-07-23 DIAGNOSIS — Z79899 Other long term (current) drug therapy: Secondary | ICD-10-CM | POA: Diagnosis not present

## 2021-07-23 DIAGNOSIS — Z86711 Personal history of pulmonary embolism: Secondary | ICD-10-CM | POA: Diagnosis not present

## 2021-07-23 DIAGNOSIS — D6869 Other thrombophilia: Secondary | ICD-10-CM | POA: Diagnosis not present

## 2021-07-23 DIAGNOSIS — Z888 Allergy status to other drugs, medicaments and biological substances status: Secondary | ICD-10-CM | POA: Diagnosis not present

## 2021-07-23 DIAGNOSIS — I480 Paroxysmal atrial fibrillation: Secondary | ICD-10-CM | POA: Diagnosis not present

## 2021-07-23 DIAGNOSIS — E7800 Pure hypercholesterolemia, unspecified: Secondary | ICD-10-CM | POA: Insufficient documentation

## 2021-07-23 DIAGNOSIS — E119 Type 2 diabetes mellitus without complications: Secondary | ICD-10-CM | POA: Diagnosis not present

## 2021-07-23 DIAGNOSIS — Z885 Allergy status to narcotic agent status: Secondary | ICD-10-CM | POA: Diagnosis not present

## 2021-07-23 DIAGNOSIS — I509 Heart failure, unspecified: Secondary | ICD-10-CM | POA: Diagnosis not present

## 2021-07-23 DIAGNOSIS — I428 Other cardiomyopathies: Secondary | ICD-10-CM | POA: Insufficient documentation

## 2021-07-23 NOTE — Progress Notes (Signed)
Primary Care Physician: Susy Frizzle, MD Primary Cardiologist: Dr Aundra Dubin Primary Electrophysiologist: none Referring Physician: Dr Milon Score [name] is a 75 y.o. female with a history of HTN, DM, systolic CHF, asthma, HLD, and paroxysmal atrial fibrillation who presents for follow up in the [facility].  The patient was initially diagnosed with atrial fibrillation 12/2014 after presenting with symptoms of increased dyspnea. Her Jodelle Red showed atrial fibrillation. Patient is on Eliquis for a CHADS2VASC score of 6. On 12/21/19, patient reported that she was "feeling funny" and checked her Jodelle Red which showed rapid afib. She took an extra dose of Coreg but her rapid rates persistent and she presented to the ER. She spontaneously converted in the ER. There were no specific triggers that she could identify. She denies alcohol use. She does have snoring and daytime somnolence.   On follow up today, patient reports that she has felt more fatigued since having COVID this past summer. She also describes "brain fog". She denies any difficulty breathing. She did have one episode of afib which resolved on taking her evening medications. She denies any bleeding issues on anticoagulation.   Today, she denies symptoms of palpitations, chest pain, shortness of breath, orthopnea, PND, lower extremity edema, dizziness, presyncope, syncope, bleeding, or neurologic sequela. The patient is tolerating medications without difficulties and is otherwise without complaint today.    Atrial Fibrillation Risk Factors:  she does have symptoms or diagnosis of sleep apnea. Declined sleep study. she does not have a history of rheumatic fever. she does not have a history of alcohol use. The patient does not have a history of early familial atrial fibrillation or other arrhythmias.  she has a BMI of Body mass index is 32.92 kg/m.Marland Kitchen Filed Weights   07/23/21 1033  Weight: 81.6 kg      Family History  Problem Relation Age of Onset   Asthma Maternal Grandmother    Breast cancer Maternal Grandmother        dx. 30s   Stomach cancer Maternal Grandmother 60       lots of stomach problems   Heart attack Paternal Grandfather    Other Daughter        cyst on ovary; unilateral oophorectomy   Heart attack Brother    Cancer Maternal Aunt        unknown type   Celiac disease Maternal Aunt    Diabetes Maternal Uncle    Colon cancer Cousin        dx. 50s-60s   Colon cancer Paternal Aunt        dx. late 32s   Lung cancer Paternal Aunt        dx. late 60s-70s; former smoker   Heart attack Paternal Aunt    Heart attack Paternal Uncle      Atrial Fibrillation Management history:  Previous antiarrhythmic drugs: none Previous cardioversions: none Previous ablations: none CHADS2VASC score: 6 Anticoagulation history: Eliquis   Past Medical History:  Diagnosis Date   Allergy 2000   Allergy history unknown    Arthritis    oa   Asthma    takes acolate for   Breast cancer ([place]) 1999; 2008   hx of bilateral breast cancer   Breast cancer, left breast ([place]) 10/27/2012   3.2 cm ER positive, Her-2 negative, 1 node positive S/P mastectomy/TTRAM reconstruction 4/08   Breast cancer, right breast ([place]) 10/27/2012   3.2 cm ER/PR positive, 1 node positive, Her-2 negative Rx w mastectomy,  AC -T chemo, followed by aromatase inhibitor x 5 years dx 4/08   Cancer [facility])    breast CA   Cataracts, bilateral    CHF (congestive heart failure) (HCC)    Dehydration after exertion    after she went berry picking in the summer heat . see ED visit    Diabetes ([place])    DM2 (diabetes mellitus, type 2) ([place])    on welchol for   Dyspnea    with exertion   Elevated cholesterol    GERD (gastroesophageal reflux disease)    Hiatal hernia    Hypertension    LBBB (left bundle branch block) 10/27/2012   Leg cramps    NICM (nonischemic cardiomyopathy) ([place])    a. Echo:  06/14/12: Poor  endocardial definition, possible septal HK, EF 50-55%, normal wall motion, mild LAE ;  b.  [facility] 9/13:  normal cors, EF 35-40%,  c. Echo 7/14: EF 50-55%, mild LAE   Other fatigue 01/01/2015   PONV (postoperative nausea and vomiting)    Pulmonary embolus ([place]) 2001   after tram flap surgery   Past Surgical History:  Procedure Laterality Date   BREAST SURGERY  2000 &2005   DILATION AND CURETTAGE OF UTERUS   60yr ago   x 2   EYE SURGERY  2020   JOINT REPLACEMENT     KNEE CLOSED REDUCTION Right 06/01/2018   Procedure: CLOSED MANIPULATION RIGHT KNEE;  Surgeon: AGaynelle Arabian MD;  Location: [facility];  Service: Orthopedics;  Laterality: Right;   KNEE CLOSED REDUCTION Left 02/22/2019   Procedure: CLOSED MANIPULATION KNEE;  Surgeon: AGaynelle Arabian MD;  Location: [facility];  Service: Orthopedics;  Laterality: Left;  131m   KNEE SURGERY  2006   left torn cartilage repair   LEFT HEART CATHETERIZATION WITH CORONARY ANGIOGRAM N/A 06/15/2012   Procedure: LEFT HEART CATHETERIZATION WITH CORONARY ANGIOGRAM;  Surgeon: MuWellington HampshireMD;  [facility];  Service: Cardiovascular;  Laterality: N/A;   MASTECTOMY     bilateral   RECONSTRUCTION BREAST W/ TRAM FLAP Left 2001   TOTAL KNEE ARTHROPLASTY Right 03/07/2018   Procedure: RIGHT TOTAL KNEE ARTHROPLASTY;  Surgeon: AlGaynelle ArabianMD;  Location: [facility];  Service: Orthopedics;  Laterality: Right;   TOTAL KNEE ARTHROPLASTY Left 10/31/2018   Procedure: TOTAL KNEE ARTHROPLASTY;  Surgeon: AlGaynelle ArabianMD;  Location: [facility];  Service: Orthopedics;  Laterality: Left;  5057m   Current Outpatient Medications  Medication Sig Dispense Refill   acetaminophen (TYLENOL) 500 MG tablet Take 1,000 mg by mouth as needed for mild pain or moderate pain.      amoxicillin (AMOXIL) 500 MG capsule For dental appointments     Ascorbic Acid (VITAMIN C WITH ROSE HIPS) 500 MG tablet Take 500 mg by mouth daily.     atorvastatin (LIPITOR) 20 MG tablet TAKE 2 TABLETS  (40 MG TOTAL) BY MOUTH EVERY EVENING. 180 tablet 2   Blood Glucose Monitoring Suppl (ACCU-CHEK AVIVA PLUS) w/Device KIT Use to check FBS QAM - DX:E11.9 1 kit 1   Blood Glucose Monitoring Suppl (ONE TOUCH ULTRA 2) W/DEVICE KIT Check FBS qam - Dx:250.00 and she will need lancets (1 box) , strips (1 bottle = 100) and controls also thanks 1 each 0   calcium-vitamin D (OSCAL WITH D) 500-200 MG-UNIT TABS tablet calcium carbonate 500 mg-vitamin D3 5 mcg (200 unit) tablet   1 by oral route.     carvedilol (COREG) 12.5 MG tablet TAKE 1 TABLET IN THE  MORNING AND 1.5 TABLETS IN THE EVENING 225 tablet 3   colesevelam (WELCHOL) 625 MG tablet TAAKE 3 TABLETS BY MOUTH TWICE A DAY WITH A MEAL 540 tablet 3   DEXILANT 60 MG capsule TAKE ONE CAPSULE BY MOUTH EVERY DAY 90 capsule 3   diltiazem (CARDIZEM) 30 MG tablet Take 1 tablet every 4 hours AS NEEDED for AFIB heart rate >100 45 tablet 1   ELIQUIS 5 MG TABS tablet TAKE 1 TABLET BY MOUTH TWICE A DAY 180 tablet 3   fluticasone (FLONASE) 50 MCG/ACT nasal spray USE 2 SPRAYS IN EACH NOSTRIL DAILY FOR 3 MONTHS 48 g 3   Ginkgo Biloba 120 MG TABS Take 120 mg by mouth daily.     Glucosamine-Chondroitin (COSAMIN DS PO) Take 1 tablet by mouth 2 (two) times a day.     glucose blood test strip Use to check BS QAM DX: E11.9 100 each 3   Lancets (ACCU-CHEK SOFT TOUCH) lancets Use to check BS QAM DX: E11.9 100 each 3   losartan (COZAAR) 100 MG tablet Take 1 tablet (100 mg total) by mouth daily. 90 tablet 3   Multiple Vitamin (MULTIVITAMIN WITH MINERALS) TABS tablet Take 1 tablet by mouth daily. Senior Multivitamin Iron Free Formula     spironolactone (ALDACTONE) 25 MG tablet TAKE 1 TABLET BY MOUTH EVERY DAY 90 tablet 3   vitamin B-12 (CYANOCOBALAMIN) 1000 MCG tablet Take 1,000 mcg by mouth daily.     VITAMIN D, CHOLECALCIFEROL, PO Take by mouth daily.      zafirlukast (ACCOLATE) 20 MG tablet TAKE 1 TABLET (20 MG TOTAL) BY MOUTH 2 (TWO) TIMES DAILY. 180 tablet 4   No current  facility-administered medications for this encounter.    Allergies  Allergen Reactions   Adhesive [Tape] Other (See Comments)    Skin turns red    Diphenhydramine Other (See Comments)    Pt feels wired, hyperactive   Vicodin [Hydrocodone-Acetaminophen] Nausea And Vomiting    Social History   Socioeconomic History   Marital status: Married    Spouse name: Dwight   Number of children: 2   Years of education: Not on file   Highest education level: Not on file  Occupational History   Occupation: retired    Fish farm manager: RETIRED    Comment: post Furniture conservator/restorer  Tobacco Use   Smoking status: Never   Smokeless tobacco: Never  Vaping Use   Vaping Use: Never used  Substance and Sexual Activity   Alcohol use: Never   Drug use: Never   Sexual activity: Not Currently    Birth control/protection: None  Other Topics Concern   Not on file  Social History Narrative   1 son and 1 daughter.   Social Determinants of Health   Financial Resource Strain: Low Risk    Difficulty of Paying Living Expenses: Not hard at all  Food Insecurity: No Food Insecurity   Worried About Charity fundraiser in the Last Year: Never true   [place] in the Last Year: Never true  Transportation Needs: No Transportation Needs   Lack of Transportation (Medical): No   Lack of Transportation (Non-Medical): No  Physical Activity: Sufficiently Active   Days of Exercise per Week: 5 days   Minutes of Exercise per Session: 30 min  Stress: No Stress Concern Present   Feeling of Stress : Not at all  Social Connections: Moderately Integrated   Frequency of Communication with Friends and Family: More than three times a week  Frequency of Social Gatherings with Friends and Family: More than three times a week   Attends Religious Services: Never   Marine scientist or Organizations: No   Attends Music therapist: 1 to 4 times per year   Marital Status: Married  Human resources officer  Violence: Not At Risk   Fear of Current or Ex-Partner: No   Emotionally Abused: No   Physically Abused: No   Sexually Abused: No     ROS- All systems are reviewed and negative except as per the HPI above.  Physical Exam: Vitals:   07/23/21 1033  BP: 116/72  Pulse: 74  Weight: 81.6 kg  Height: 5' 2"  (1.575 m)    GEN- The patient is a well appearing obese elderly female, alert and oriented x 3 today.   HEENT-head normocephalic, atraumatic, sclera clear, conjunctiva pink, hearing intact, trachea midline. Lungs- Clear to ausculation bilaterally, normal work of breathing Heart- Regular rate and rhythm, no murmurs, rubs or gallops  GI- soft, NT, ND, + BS Extremities- no clubbing, cyanosis, or edema MS- no significant deformity or atrophy Skin- no rash or lesion Psych- euthymic mood, full affect Neuro- strength and sensation are intact   Wt Readings from Last 3 Encounters:  07/23/21 81.6 kg  07/08/21 81.2 kg  07/04/21 81.6 kg    EKG today demonstrates  SR, LBBB Vent. rate 74 BPM PR interval 196 ms QRS duration 152 ms QT/QTcB 422/468 ms  Echo 10/31/19 demonstrated  1. Left ventricular ejection fraction, by visual estimation, is 30 to  35%. The left ventricle has moderately decreased function. There is no left ventricular hypertrophy.   2. Left ventricular diastolic parameters are consistent with Grade I  diastolic dysfunction (impaired relaxation).   3. The left ventricle demonstrates global hypokinesis.   4. Global right ventricle has normal systolic function.The right  ventricular size is normal. No increase in right ventricular wall  thickness.   5. Left atrial size was normal.   6. Right atrial size was normal.   7. The mitral valve is normal in structure. Trivial mitral valve  regurgitation. No evidence of mitral stenosis.   8. The tricuspid valve is normal in structure.   9. The tricuspid valve is normal in structure. Tricuspid valve  regurgitation is trivial.   10. The aortic valve is normal in structure. Aortic valve regurgitation is  not visualized. No evidence of aortic valve sclerosis or stenosis.  11. The pulmonic valve was grossly normal. Pulmonic valve regurgitation is  trivial.  12. The atrial septum is grossly normal.   Epic records are reviewed at length today  CHA2DS2-VASc Score = 6  The patient's score is based upon: CHF History: 1 HTN History: 1 Diabetes History: 1 Stroke History: 0 Vascular Disease History: 0 Age Score: 2 Gender Score: 1      ASSESSMENT AND PLAN: 1. Paroxysmal Atrial Fibrillation (ICD10:  I48.0) The patient's CHA2DS2-VASc score is 6, indicating a 9.7% annual risk of stroke.   Patient appears to be maintaining SR.  Continue diltiazem 30 mg PRN q 4 hours for heart racing. Kardia for home monitoring.   Continue Eliquis 5 mg BID Continue Coreg 12.5 mg AM and 18.75 mg PM  2. Secondary Hypercoagulable State (ICD10:  D68.69) The patient is at significant risk for stroke/thromboembolism based upon her CHA2DS2-VASc Score of 6.  Continue Apixaban (Eliquis).   3. Obesity Body mass index is 32.92 kg/m. Lifestyle modification was discussed and encouraged including regular physical activity and weight reduction.  4. HTN Stable, no changes today.  5. NICM Cardiac MRI showed EF 54% No signs or symptoms of fluid overload.    Follow up with Dr Aundra Dubin as scheduled. AF clinic in 6 months.    [facility] [address] [place],  [address] [phone] 07/23/2021 10:41 AM

## 2021-07-25 ENCOUNTER — Telehealth (HOSPITAL_COMMUNITY): Payer: Self-pay | Admitting: *Deleted

## 2021-07-25 MED ORDER — MULTAQ 400 MG PO TABS: 400.0000 mg | ORAL_TABLET | Freq: Two times a day (BID) | ORAL | 3 refills | Status: DC

## 2021-07-25 NOTE — Telephone Encounter (Signed)
Patient dropped off kardia strips to front desk on 07/23/21 of afib episode. Patient concerned regarding increasing in frequency as this is her second episodes this month of AF (which does respond will to PRN cardizem) and just feels she is "going downhill".  Discussed with Adline Peals PA will start Multaq 400mg  twice a day with follow up next week for EKG. Pt in agreement.

## 2021-07-28 ENCOUNTER — Telehealth (HOSPITAL_COMMUNITY): Payer: Self-pay | Admitting: *Deleted

## 2021-07-28 ENCOUNTER — Other Ambulatory Visit (HOSPITAL_COMMUNITY): Payer: Self-pay

## 2021-07-28 MED ORDER — LOSARTAN POTASSIUM 100 MG PO TABS: 100.0000 mg | ORAL_TABLET | Freq: Every day | ORAL | 3 refills | Status: DC

## 2021-07-28 NOTE — Telephone Encounter (Signed)
Patient called in stating she start Multaq today and just realized she took both doses of Multaq by mistake this morning. This occurred approximately 15 minutes ago. Discussed with Pharm-D states hold evening dose and resume tomorrow. Pt reassured but pt very anxious with recommendations. ER precautions reviewed with patient.

## 2021-08-01 ENCOUNTER — Ambulatory Visit (HOSPITAL_COMMUNITY)
Admission: RE | Admit: 2021-08-01 | Discharge: 2021-08-01 | Disposition: A | Discharge status: Home / Self Care | Payer: Medicare Other | Source: Ambulatory Visit | Attending: Physician Assistant | Admitting: Physician Assistant

## 2021-08-01 ENCOUNTER — Other Ambulatory Visit: Payer: Self-pay

## 2021-08-01 DIAGNOSIS — I480 Paroxysmal atrial fibrillation: Secondary | ICD-10-CM | POA: Diagnosis not present

## 2021-08-01 NOTE — Patient Instructions (Signed)
Stop Multaq today

## 2021-08-01 NOTE — Progress Notes (Signed)
Patient returns for ECG after starting Multaq. ECG shows SR, LBBB, HR 73, PR 204, QRS 160, QTc 495. Patient reports that since starting the medication she has had symptoms of an "uncomfortable" feeling in her lower chest/epigastric area, "like something is hung up." ? GI side effects from medication. Will stop Multaq. Will monitor her afib burden and consider alternate AAD if persistent. F/u with Dr Aundra Dubin as scheduled.

## 2021-08-18 ENCOUNTER — Telehealth: Payer: Self-pay | Admitting: Family Medicine

## 2021-08-18 NOTE — Telephone Encounter (Signed)
Patient's spouse Orpah Greek called to dispute bill received for patient's AWV; bill needs to be submitted to insurance. Patient's spouse stated bill has to be submitted to [facility] first; otherwise, payment will not be issued.   Primary: Medicare, ID #[id number] SecondaryChristella Scheuermann, ID #[id number]  Please advise at [phone]

## 2021-08-18 NOTE — Telephone Encounter (Signed)
I called and spoke with patient and spouse Orpah Greek. She gave me permission to discuss with her husband. Orpah Greek was looking at an EOB from his insurance company. He  didn't have a bill from [facility]. I have looked in patients guarantor account and don't see that she has an outstanding balance and I see DOS 07/04/21 has been paid by Medicare. Dwight verbalized understanding that patient didn't have an outstanding balance with [place] and that DOS has been covered by insurance.

## 2021-09-16 ENCOUNTER — Other Ambulatory Visit: Payer: Self-pay

## 2021-09-16 MED ORDER — DEXLANSOPRAZOLE 60 MG PO CPDR: 1.0000 | DELAYED_RELEASE_CAPSULE | Freq: Every day | ORAL | 1 refills | Status: DC

## 2021-09-17 ENCOUNTER — Other Ambulatory Visit: Payer: Self-pay

## 2021-09-17 ENCOUNTER — Ambulatory Visit (HOSPITAL_BASED_OUTPATIENT_CLINIC_OR_DEPARTMENT_OTHER)
Admission: RE | Admit: 2021-09-17 | Discharge: 2021-09-17 | Disposition: A | Discharge status: Home / Self Care | Payer: Medicare Other | Source: Ambulatory Visit | Attending: Cardiology | Admitting: Cardiology

## 2021-09-17 ENCOUNTER — Encounter (HOSPITAL_COMMUNITY): Payer: Self-pay | Admitting: Cardiology

## 2021-09-17 ENCOUNTER — Other Ambulatory Visit (HOSPITAL_COMMUNITY): Payer: Self-pay

## 2021-09-17 ENCOUNTER — Ambulatory Visit (HOSPITAL_COMMUNITY)
Admission: RE | Admit: 2021-09-17 | Discharge: 2021-09-17 | Disposition: A | Discharge status: Home / Self Care | Payer: Medicare Other | Source: Ambulatory Visit | Attending: Cardiology | Admitting: Cardiology

## 2021-09-17 VITALS — BP 112/70 | HR 72 | Wt 179.6 lb

## 2021-09-17 DIAGNOSIS — I110 Hypertensive heart disease with heart failure: Secondary | ICD-10-CM | POA: Insufficient documentation

## 2021-09-17 DIAGNOSIS — I447 Left bundle-branch block, unspecified: Secondary | ICD-10-CM | POA: Insufficient documentation

## 2021-09-17 DIAGNOSIS — I480 Paroxysmal atrial fibrillation: Secondary | ICD-10-CM | POA: Insufficient documentation

## 2021-09-17 DIAGNOSIS — I509 Heart failure, unspecified: Secondary | ICD-10-CM | POA: Insufficient documentation

## 2021-09-17 DIAGNOSIS — Z79899 Other long term (current) drug therapy: Secondary | ICD-10-CM | POA: Insufficient documentation

## 2021-09-17 DIAGNOSIS — R079 Chest pain, unspecified: Secondary | ICD-10-CM | POA: Diagnosis not present

## 2021-09-17 DIAGNOSIS — I428 Other cardiomyopathies: Secondary | ICD-10-CM | POA: Insufficient documentation

## 2021-09-17 DIAGNOSIS — Z8616 Personal history of COVID-19: Secondary | ICD-10-CM | POA: Insufficient documentation

## 2021-09-17 DIAGNOSIS — I3481 Nonrheumatic mitral (valve) annulus calcification: Secondary | ICD-10-CM | POA: Diagnosis not present

## 2021-09-17 DIAGNOSIS — Z7901 Long term (current) use of anticoagulants: Secondary | ICD-10-CM | POA: Insufficient documentation

## 2021-09-17 DIAGNOSIS — E119 Type 2 diabetes mellitus without complications: Secondary | ICD-10-CM | POA: Diagnosis not present

## 2021-09-17 DIAGNOSIS — E785 Hyperlipidemia, unspecified: Secondary | ICD-10-CM | POA: Diagnosis not present

## 2021-09-17 LAB — CBC
HCT: 32.8 % — ABNORMAL LOW (ref 36.0–46.0)
Hemoglobin: 10.8 g/dL — ABNORMAL LOW (ref 12.0–15.0)
MCH: 30.8 pg (ref 26.0–34.0)
MCHC: 32.9 g/dL (ref 30.0–36.0)
MCV: 93.4 fL (ref 80.0–100.0)
Platelets: 276 10*3/uL (ref 150–400)
RBC: 3.51 MIL/uL — ABNORMAL LOW (ref 3.87–5.11)
RDW: 13.5 % (ref 11.5–15.5)
WBC: 6.1 10*3/uL (ref 4.0–10.5)
nRBC: 0 % (ref 0.0–0.2)

## 2021-09-17 LAB — BASIC METABOLIC PANEL
Anion gap: 7 (ref 5–15)
BUN: 22 mg/dL (ref 8–23)
CO2: 23 mmol/L (ref 22–32)
Calcium: 9.3 mg/dL (ref 8.9–10.3)
Chloride: 108 mmol/L (ref 98–111)
Creatinine, Ser: 1 mg/dL (ref 0.44–1.00)
GFR, Estimated: 58 mL/min — ABNORMAL LOW (ref >60)
Glucose, Bld: 111 mg/dL — ABNORMAL HIGH (ref 70–99)
Potassium: 4.7 mmol/L (ref 3.5–5.1)
Sodium: 138 mmol/L (ref 135–145)

## 2021-09-17 LAB — ECHOCARDIOGRAM COMPLETE
AR max vel: 3 cm2
AV Peak grad: 6.7 mmHg
Ao pk vel: 1.29 m/s
Area-P 1/2: 4.1 cm2
Calc EF: 38.2 %
S' Lateral: 3.1 cm
Single Plane A2C EF: 38.4 %
Single Plane A4C EF: 38.2 %

## 2021-09-17 MED ORDER — ENTRESTO 24-26 MG PO TABS: 1.0000 | ORAL_TABLET | Freq: Two times a day (BID) | ORAL | 3 refills | Status: DC

## 2021-09-17 NOTE — Patient Instructions (Signed)
Medication Changes:  Stop Losartan  Start Entresto 24/26 mg Twice daily   Lab Work:  Labs done today, your results will be available in MyChart, we will contact you for abnormal readings.  Testing/Procedures:  None  Referrals:  You have been referred to [facility] to discuss an a-fib ablation, they will call you to schedule  Special Instructions // Education:  Do the following things EVERYDAY: Weigh yourself in the morning before breakfast. Write it down and keep it in a log. Take your medicines as prescribed Eat low salt foods--Limit salt (sodium) to 2000 mg per day.  Stay as active as you can everyday Limit all fluids for the day to less than 2 liters  Follow-Up in:   3-4 weeks with our Heart Failure Pharmacist  AND  4 months with our Advance Practice Provider  At the [facility], you and your health needs are our priority. We have a designated team specialized in the treatment of Heart Failure. This Care Team includes your primary Heart Failure Specialized Cardiologist (physician), Advanced Practice Providers (APPs- Physician Assistants and Nurse Practitioners), and Pharmacist who all work together to provide you with the care you need, when you need it.   You may see any of the following providers on your designated Care Team at your next follow up:  Dr Glori Bickers Dr Haynes Kerns, NP Lyda Jester, [place] [facility] [place], [place] Audry Riles, PharmD   Please be sure to bring in all your medications bottles to every appointment.   Need to Contact us:  If you have any questions or concerns before your next appointment please send [place] a message through [place] or call our office at [phone].    TO LEAVE A MESSAGE FOR THE NURSE SELECT OPTION 2, PLEASE LEAVE A MESSAGE INCLUDING: YOUR NAME DATE OF BIRTH CALL BACK NUMBER REASON FOR CALL**this is important as we prioritize the call backs  YOU WILL RECEIVE A  CALL BACK THE SAME DAY AS LONG AS YOU CALL BEFORE 4:00 PM

## 2021-09-18 NOTE — Progress Notes (Signed)
Patient ID: [name], female   DOB: January 08, 1946, 75 y.o.   MRN: [id number]    Advanced Heart Failure Clinic Note   PCP: Dr. Dennard Schaumann HF: Dr. Aundra Dubin   75 y.o. with history of HTN and type II diabetes presents for followup of CHF.  Back in 2009, she had an echo with normal EF.  She was admitted in 9/13 with chest pain and LBBB.  She had LHC showing no CAD but EF 35-40%.  Echo was a technically difficult study with EF reported as 50-55%.  She has been taking ARB and Coreg.  Repeat echo in 12/13 showed EF 40% with septal hypokinesis.  Cardiac MRI was done in 12/13 as well, with calculated EF 50%, global hypokinesis, and no delayed enhancement.  Echo in 7/15 showed EF 55-60% with mild MR and mild TR (no LBBB at this time).   In 3/16, she began to develop increased dyspnea.  She was seen by Truitt Merle and was noted to have a LBBB again. Echo in 3/16 showed EF down to 35-40%.  She was then seen in followup by Rosaria Ferries.  Alivecor on her phone showed a period of HR up to 160s that appeared to be atrial fibrillation.  She felt tachypalpitations. Coreg was increased and she was started on apixaban 5 mg bid. CPX in 4/16 showed a submaximal effort but normal spirometry and normal peak VO2.  She did have ST changes possibly concerning for ischemia (but has baseline LBBB so hard to interpret).    Cardiolite in 5/16 showed on ischemia or infarction.  Most recent echo in 2/17 showed EF 45-50%, diffuse hypokinesis, grade I diastolic dysfunction, normal RV size and systolic function.  Echo 1/18 with EF stable at 45%.  Echo in 4/19 showed EF 50-55% with septal dyssynergy.   Echo in 1/21 showed EF 40-45%, normal RV.  Cardiac MRI was done in 5/21, showing LV EF 54%, septal-lateral dyssynchrony c/w LBBB, RV EF 53%, no LGE.   Patient had COVID-19 in 7/22.   Echo was done today and reviewed, EF 40-45%, septal-lateral dyssynchrony, normal RV.    Patient returns for followup of CHF and atrial fibrillation.  She  had several episodes of atrial fibrillation in the fall (notes with her device).  She tried Multaq in 10/22 but did not tolerate due to side effects.  She is in NSR today and has not had palpitations lately.  She is also off [place] due to side effects (made her feel "sick."). She has had poor stamina since COVID infection. No formal exercise due to bilateral knee pain. She has bendopnea.  Generally ok walking on flat ground but short of breath walking up an incline.  No chest pain.  She also reports snoring and daytime sleepiness.     ECG (personally reviewed): NSR, LBBB 154 msec  Labs (9/13): TSH 4.884 (elevated), proBNP 155 Labs (10/13): K 3.6, creatinine 0.8 Labs (1/14): K 3.7, creatinine 0.8, BNP not elevated, ANA weakly positive, TSH normal Labs (7/14): BNP normal Labs (11/14): K 4.1, creatinine 0.73 Labs (10/15): K 4.2, creatinine 0.73, LDL 45 Labs (3/16): BNP 46, HCT 42.2, TSH normal Labs (4/16): K 4.1, creatinine 0.78 => 0.8, HCT 39.3 Labs (1/17): K 4.6, creatinine 1.06, hgb 13.1, BNP 22 Labs (10/17): LDL 49, HDL 37, K 4.8, creatinine 0.92, TSH normal, BNP 62 Labs (4/18): LDL 48, K 4.4, creatinine 0.91 Labs (7/19): K 4.3, creatinine 1.02, 11.7  Labs (6/20): K 4.8, creatinine 0.94 Labs (8/21): K 4.8, creatinine  0.91, LDL 42 Labs (1/22): K 4.5, creaitnine 1.04 Labs (10/22): K 4.2, creatinine 1.01  PMH: 1. LBBB/IVCD: Now persistent.   2. Breast cancer s/p bilateral mastectomy. 3. GERD 4. Type II diabetes mellitus 5. HTN 6. Asthma 7. PE in 2001 8. Hyperlipidemia 9. Nonischemic Cardiomyopathy: Possible LBBB cardiomyopathy. Echo 8/09 with 60%.  Admitted in 9/13 with chest pain.  LHC showed no angiographic CAD and EF 35-40%.  Echo at that time was read as showing EF 50-55% but was a technically difficult study.  Echo (12/13): EF 40% with septal hypokinesis, normal RV size and systolic function, no significant valvular abnormalities.  Cardiac MRI (12/13): EF 50%, global hypokinesis, no  delayed enhancement.  Echo (7/14) with EF 50-55%, mild LV dilation, mild LAE.  Echo (7/15) with EF 55-60%, mild MR and mild TR.  Echo (3/16) with EF 35-40%, septal-lateral dyssynchrony.  CPX (4/16) with RER 0.98 (submaximal), peak VO2 19.5, VE/VCO2 slope 29.4 => normal spirometry, normal peak VO2, but ST changes suggest possibility of ischemia.  Lexiscan Cardiolite (5/16) with no ischemia/infarction. - Echo (2/17) with EF 45-50%, diffuse hypokinesis, grade I diastolic dysfunction, normal RV size and systolic function.  - Echo (1/18) with EF 45%, septal-lateral dyssynchrony, septal hypokinesis, normal RV size and systolic function.  - Echo (4/19) with EF 50-55%, mild LVH, septal-lateral dyssynchony.  - Cardiac MRI (5/21): LV EF 54%, septal-lateral dyssynchrony c/w LBBB, RV EF 53%, no LGE.  - Echo (12/22): EF 40-45%, septal-lateral dyssynchrony, normal RV 10. Supraclavicular lymphadenopathy: Stable by CT back to 2009, suspect benign.  11. Atrial fibrillation: Paroxysmal.  Holter (4/16) with rare PACs and PVCs, short run 5 beats SVT.  - Atrial fibrillation transiently in 1/21.  - Did not tolerate Multaq.  12. Right TKR 6/19, left TKR 1/20 13. COVID-19 7/22  SH: Married, lives in [place], never smoked.  FH: Uncle with ? SCD.  Brother with ? SCD.    ROS: All systems reviewed and negative except as per HPI.   Current Outpatient Medications  Medication Sig Dispense Refill   ACCU-CHEK AVIVA PLUS test strip USE TO CHECK BLOOD SUGAR EVERY MORNING DX: E11.9 100 strip 3   acetaminophen (TYLENOL) 500 MG tablet Take 1,000 mg by mouth as needed for mild pain or moderate pain.      amoxicillin (AMOXIL) 500 MG capsule For dental appointments     Ascorbic Acid (VITAMIN C WITH ROSE HIPS) 500 MG tablet Take 500 mg by mouth daily.     atorvastatin (LIPITOR) 20 MG tablet TAKE 2 TABLETS (40 MG TOTAL) BY MOUTH EVERY EVENING. 180 tablet 2   Blood Glucose Monitoring Suppl (ACCU-CHEK AVIVA PLUS)  w/Device KIT Use to check FBS QAM - DX:E11.9 1 kit 1   Blood Glucose Monitoring Suppl (ONE TOUCH ULTRA 2) W/DEVICE KIT Check FBS qam - Dx:250.00 and she will need lancets (1 box) , strips (1 bottle = 100) and controls also thanks 1 each 0   calcium-vitamin D (OSCAL WITH D) 500-200 MG-UNIT TABS tablet Take 1 tablet by mouth daily.     carvedilol (COREG) 12.5 MG tablet TAKE 1 TABLET IN THE MORNING AND 1.5 TABLETS IN THE EVENING 225 tablet 3   clobetasol ointment (TEMOVATE) 0.05 % SMARTSIG:Sparingly Topical Twice Daily     colesevelam (WELCHOL) 625 MG tablet TAAKE 3 TABLETS BY MOUTH TWICE A DAY WITH A MEAL 540 tablet 3   dexlansoprazole (DEXILANT) 60 MG capsule Take 1 capsule (60 mg total) by mouth daily. 90 capsule 1  diltiazem (CARDIZEM) 30 MG tablet TAKE 1 TABLET EVERY 4 HOURS AS NEEDED FOR AFIB HEART RATE >100 45 tablet 1   ELIQUIS 5 MG TABS tablet TAKE 1 TABLET BY MOUTH TWICE A DAY 180 tablet 3   fluticasone (FLONASE) 50 MCG/ACT nasal spray Place into both nostrils as needed for allergies or rhinitis.     Ginkgo Biloba 120 MG TABS Take 120 mg by mouth daily.     Glucosamine-Chondroitin (COSAMIN DS PO) Take 1 tablet by mouth 2 (two) times a day.     Lancets (ACCU-CHEK SOFT TOUCH) lancets Use to check BS QAM DX: E11.9 100 each 3   Multiple Vitamin (MULTIVITAMIN WITH MINERALS) TABS tablet Take 1 tablet by mouth daily. Senior Multivitamin Iron Free Formula     sacubitril-valsartan (ENTRESTO) 24-26 MG Take 1 tablet by mouth 2 (two) times daily. 60 tablet 3   spironolactone (ALDACTONE) 25 MG tablet TAKE 1 TABLET BY MOUTH EVERY DAY 90 tablet 3   vitamin B-12 (CYANOCOBALAMIN) 1000 MCG tablet Take 1,000 mcg by mouth daily.     VITAMIN D, CHOLECALCIFEROL, PO Take by mouth daily.      zafirlukast (ACCOLATE) 20 MG tablet TAKE 1 TABLET (20 MG TOTAL) BY MOUTH 2 (TWO) TIMES DAILY. 180 tablet 4   No current facility-administered medications for this encounter.    BP 112/70    Pulse 72    Wt 81.5 kg (179 lb  9.6 oz)    SpO2 96%    BMI 32.85 kg/m    Wt Readings from Last 3 Encounters:  09/17/21 81.5 kg (179 lb 9.6 oz)  07/23/21 81.6 kg (180 lb)  07/08/21 81.2 kg (179 lb)   General: NAD Neck: No JVD, no thyromegaly or thyroid nodule.  Lungs: Clear to auscultation bilaterally with normal respiratory effort. CV: Nondisplaced PMI.  Heart regular S1/S2, no S3/S4, no murmur.  No peripheral edema.  No carotid bruit.  Normal pedal pulses.  Abdomen: Soft, nontender, no hepatosplenomegaly, no distention.  Skin: Intact without lesions or rashes.  Neurologic: Alert and oriented x 3.  Psych: Normal affect. Extremities: No clubbing or cyanosis.  HEENT: Normal.   Assessment/Plan: 1. Nonischemic cardiomyopathy:   No CAD on cath 9/13 but EF 35-40% at that time.  ECG showed LBBB.  LBBB resolved and EF normalized.  However, in 3/16, patient had fatigue and dyspnea => repeat echo with EF back down to 35-40%, she now has a persistent LBBB. This may be a LBBB cardiomyopathy.  CPX in 4/16 was submaximal but showed normal spirometry and actually near normal peak VO2.  Echo in 4/19 showed EF stable at 50-55% with septal-lateral dyssynchrony.  Cardiac MRI in 5/21 showed LV EF 54%, septal-lateral dyssynchrony c/w LBBB, RV EF 53%, no LGE.  Echo today showed lower EF, 40-45%, septal-lateral dyssynchrony, normal RV.  NYHA class II-III symptoms (prominent fatigue). Volume status looks ok on exam.  - Continue Coreg 12.5 qam/18.75 qpm (she cannot tolerate 18.75 bid due to dizziness/fatigue).   - Stop losartan and start Entresto 24/26 bid, BMET today and in 10 days.  - Continue spironolactone 25 mg daily.  - Intolerable side effects with [place].   2. Hyperlipidemia:  Continue atorvastatin 40 mg qhs.  3. Atrial fibrillation: Paroxysmal, frequent episodes this fall.  NSR today.  She is significantly symptomatic when in AF.  She was unable to take Multaq due to side effects.   - Continue Eliquis 5 mg bid.  CBC today.  - Rather  than a different anti-arrhythmic,  I will refer her to EP for AF ablation.  She has been having frequent self-limited episodes.   4. LBBB: Persistent and may be cause of mild cardiomyopathy.  5. OSA: Suspected.  I will arrange for home sleep study.   Followup in 3 wks with HF pharmacist and see APP in 4 months.   Loralie Champagne 09/18/2021

## 2021-09-19 ENCOUNTER — Encounter (HOSPITAL_COMMUNITY): Payer: Medicare Other | Admitting: Internal Medicine

## 2021-09-19 ENCOUNTER — Other Ambulatory Visit (HOSPITAL_COMMUNITY): Payer: Medicare Other

## 2021-09-23 ENCOUNTER — Other Ambulatory Visit (HOSPITAL_COMMUNITY): Payer: Self-pay

## 2021-09-23 NOTE — Progress Notes (Signed)
PCP: Dr. Dennard Schaumann HF: Dr. Aundra Dubin     HPI:  75 y.o. with history of HTN and type II diabetes presents for followup of CHF.  Back in 2009, she had an echo with normal EF.  She was admitted in 06/2012 with chest pain and LBBB.  She had LHC showing no CAD but EF 35-40%.  Echo was a technically difficult study with EF reported as 50-55%.  She had been taking ARB and carvedilol.  Repeat echo in 09/2012 showed EF 40% with septal hypokinesis.  Cardiac MRI was done in 09/2012 as well, with calculated EF 50%, global hypokinesis, and no delayed enhancement.  Echo in 04/2014 showed EF 55-60% with mild MR and mild TR (no LBBB at this time).    In 12/2014, she began to develop increased dyspnea.  She was seen by Truitt Merle and was noted to have a LBBB again. Echo in 12/2014 showed EF down to 35-40%.  She was then seen in followup by Rosaria Ferries.  Alivecor on her phone showed a period of HR up to 160s that appeared to be atrial fibrillation.  She felt tachypalpitations. Carvedilol was increased and she was started on Apixaban 5 mg BID. CPX in 01/2015 showed a submaximal effort but normal spirometry and normal peak VO2.  She did have ST changes possibly concerning for ischemia (but has baseline LBBB so hard to interpret).     Cardiolite in 02/2015 showed ischemia or infarction.  Echo in 11/2015 showed EF 45-50%, diffuse hypokinesis, grade I diastolic dysfunction, normal RV size and systolic function.  Echo 10/2016 with EF stable at 45%.  Echo in 01/2018 showed EF 50-55% with septal dyssynergy.   Echo in 10/2019 showed EF 40-45%, normal RV.  Cardiac MRI was done in 02/2020, showing LV EF 54%, septal-lateral dyssynchrony c/w LBBB, RV EF 53%, no LGE.    Patient had COVID-19 in 04/2021.    Echo was done 09/17/21 and reviewed, EF 40-45%, septal-lateral dyssynchrony, normal RV.     Patient  recently returned to [facility] for followup of CHF and atrial fibrillation.  She had several episodes of atrial fibrillation in the fall  (noted with her device).  She tried Multaq in 07/2021 but did not tolerate due to side effects.  She was in NSR in clinic and had not noted any palpitations recently.  She was also off [place] due to side effects (made her feel "sick."). She noted poor stamina since COVID infection. No formal exercise due to bilateral knee pain. She reported bendopnea.  Was generally ok walking on flat ground but short of breath walking up an incline.  No chest pain.  She also reported snoring and daytime sleepiness.     Today she returns to HF clinic for pharmacist medication titration. At last visit with MD losartan was discontinued and Entresto 24/26 mg BID was initiated. Overall she is feeling ok since last visit. She states she had 2 episodes where she felt fluttering in her chest and thought she was going into AF. Her HR was 75 during these episodes. Episodes did not last very long. She attributes these episodes to the [facility], notes she is sensitive to many medications. No SOB/DOE while walking on flat ground. Weight has been stable at 180 lbs. No LEE, PND or orthopnea.   HF Medications: Carvedilol 12.5 QAM/18.75 QPM Entresto 24/26 mg BID Spironolactone 25 mg daily   Does the patient have any problems obtaining medications due to transportation or finances?   No - has  [employer]. Insurance prefers 90 day supply. Provided her with [facility] copay card.   Understanding of regimen: good Understanding of indications: good Potential of compliance: good Patient understands to avoid NSAIDs. Patient understands to avoid decongestants.    Pertinent Lab Values: 09/17/21: Serum creatinine 1.00, BUN 22, Potassium 4.7, Sodium 138  Vital Signs: Weight: 180.6 lbs (last clinic weight: 179.6 lbs) Blood pressure: 124/76  Heart rate: 79   Assessment/Plan: 1. Nonischemic cardiomyopathy:   No CAD on cath 06/2012 but EF 35-40% at that time.  ECG showed LBBB.  LBBB resolved and EF normalized.  However, in  12/2014, patient had fatigue and dyspnea => repeat echo with EF back down to 35-40%, she now has a persistent LBBB. This may be a LBBB cardiomyopathy.  CPX in 01/2015 was submaximal but showed normal spirometry and actually near normal peak VO2.  Echo in 01/2018 showed EF stable at 50-55% with septal-lateral dyssynchrony.  Cardiac MRI in 02/2020 showed LV EF 54%, septal-lateral dyssynchrony c/w LBBB, RV EF 53%, no LGE.  Echo 09/2021 showed lower EF, 40-45%, septal-lateral dyssynchrony, normal RV.   - NYHA class II-III symptoms (prominent fatigue). Euvolemic on exam.  - Continue carvedilol 12.5 qam/18.75 qpm (she cannot tolerate 18.75 BID due to dizziness/fatigue).  - Continue Entresto 24/26 mg BID. Patient does not want to increase her Delene Loll today as she thinks it caused two episodes of "fluttering" in her chest and thought she was going into AF. She is willing to stay on the lower dose for now. May be able to increase in the future if she remains stable.  - Continue spironolactone 25 mg daily.  - Intolerable side effects with [place].   2. Hyperlipidemia:  Continue atorvastatin 40 mg qhs.  3. Atrial fibrillation: Paroxysmal, frequent episodes this fall.  She is significantly symptomatic when in AF.  She was unable to take Multaq due to side effects.   - Continue Eliquis 5 mg BID.   - Referred to EP for AF ablation.  She has been having frequent self-limited episodes. Appointment scheduled for 10/30/21.   4. LBBB: Persistent and may be cause of mild cardiomyopathy.  5. OSA: Suspected.  Referred for home sleep study.   Follow up 3 months with APP [place], PharmD, BCPS, BCCP, CPP Heart Failure Clinic Pharmacist [phone]

## 2021-10-07 ENCOUNTER — Other Ambulatory Visit: Payer: Self-pay

## 2021-10-07 MED ORDER — ZAFIRLUKAST 20 MG PO TABS: 20.0000 mg | ORAL_TABLET | Freq: Two times a day (BID) | ORAL | 4 refills | Status: DC

## 2021-10-08 ENCOUNTER — Other Ambulatory Visit: Payer: Self-pay

## 2021-10-08 ENCOUNTER — Ambulatory Visit (HOSPITAL_COMMUNITY)
Admission: RE | Admit: 2021-10-08 | Discharge: 2021-10-08 | Disposition: A | Discharge status: Home / Self Care | Payer: Medicare HMO | Source: Ambulatory Visit | Attending: Cardiology | Admitting: Cardiology

## 2021-10-08 VITALS — BP 124/76 | HR 79 | Wt 180.6 lb

## 2021-10-08 DIAGNOSIS — M25561 Pain in right knee: Secondary | ICD-10-CM | POA: Diagnosis not present

## 2021-10-08 DIAGNOSIS — I110 Hypertensive heart disease with heart failure: Secondary | ICD-10-CM | POA: Diagnosis not present

## 2021-10-08 DIAGNOSIS — Z7901 Long term (current) use of anticoagulants: Secondary | ICD-10-CM | POA: Insufficient documentation

## 2021-10-08 DIAGNOSIS — E119 Type 2 diabetes mellitus without complications: Secondary | ICD-10-CM | POA: Insufficient documentation

## 2021-10-08 DIAGNOSIS — I509 Heart failure, unspecified: Secondary | ICD-10-CM | POA: Diagnosis not present

## 2021-10-08 DIAGNOSIS — I480 Paroxysmal atrial fibrillation: Secondary | ICD-10-CM | POA: Diagnosis not present

## 2021-10-08 DIAGNOSIS — E785 Hyperlipidemia, unspecified: Secondary | ICD-10-CM | POA: Insufficient documentation

## 2021-10-08 DIAGNOSIS — I447 Left bundle-branch block, unspecified: Secondary | ICD-10-CM | POA: Diagnosis not present

## 2021-10-08 DIAGNOSIS — I5022 Chronic systolic (congestive) heart failure: Secondary | ICD-10-CM | POA: Diagnosis not present

## 2021-10-08 DIAGNOSIS — G478 Other sleep disorders: Secondary | ICD-10-CM | POA: Insufficient documentation

## 2021-10-08 DIAGNOSIS — Z79899 Other long term (current) drug therapy: Secondary | ICD-10-CM | POA: Insufficient documentation

## 2021-10-08 DIAGNOSIS — I428 Other cardiomyopathies: Secondary | ICD-10-CM | POA: Diagnosis not present

## 2021-10-08 DIAGNOSIS — Z8616 Personal history of COVID-19: Secondary | ICD-10-CM | POA: Insufficient documentation

## 2021-10-08 DIAGNOSIS — M25562 Pain in left knee: Secondary | ICD-10-CM | POA: Diagnosis not present

## 2021-10-08 DIAGNOSIS — R0683 Snoring: Secondary | ICD-10-CM | POA: Insufficient documentation

## 2021-10-08 DIAGNOSIS — I498 Other specified cardiac arrhythmias: Secondary | ICD-10-CM | POA: Insufficient documentation

## 2021-10-08 MED ORDER — ENTRESTO 24-26 MG PO TABS: 1.0000 | ORAL_TABLET | Freq: Two times a day (BID) | ORAL | 3 refills | Status: DC

## 2021-10-08 NOTE — Patient Instructions (Addendum)
It was a pleasure seeing you today!  MEDICATIONS: -No medication changes today -Call if you have questions about your medications.   NEXT APPOINTMENT: Return to clinic in 3 months with [facility].  In general, to take care of your heart failure: -Limit your fluid intake to 2 Liters (half-gallon) per day.   -Limit your salt intake to ideally 2-3 grams (2000-3000 mg) per day. -Weigh yourself daily and record, and bring that "weight diary" to your next appointment.  (Weight gain of 2-3 pounds in 1 day typically means fluid weight.) -The medications for your heart are to help your heart and help you live longer.   -Please contact us before stopping any of your heart medications.  Call the clinic at [phone] with questions or to reschedule future appointments.  

## 2021-10-18 ENCOUNTER — Other Ambulatory Visit (HOSPITAL_COMMUNITY): Payer: Self-pay | Admitting: Physician Assistant

## 2021-10-26 ENCOUNTER — Other Ambulatory Visit (HOSPITAL_COMMUNITY): Payer: Self-pay | Admitting: Cardiology

## 2021-10-29 ENCOUNTER — Telehealth (HOSPITAL_COMMUNITY): Payer: Self-pay | Admitting: *Deleted

## 2021-10-29 NOTE — Telephone Encounter (Signed)
Pt called requesting to stop Entresto and go back to her Losartan. She states she is having issues with puffiness in arm similar to lymphedema she had in the past, as well as congestion and itchy eyes after she takes the [place]. She states all these symptoms started gradually after she was put on Entrest on 12/14. She did not take Entresto this AM and states she isn't going to take it anymore. Will send to Dr Aundra Dubin for med recommendation, ? Restart losartan 100 mg daily

## 2021-10-30 ENCOUNTER — Telehealth: Payer: Self-pay | Admitting: Cardiology

## 2021-10-30 ENCOUNTER — Ambulatory Visit (INDEPENDENT_AMBULATORY_CARE_PROVIDER_SITE_OTHER): Payer: Medicare HMO | Admitting: Cardiology

## 2021-10-30 ENCOUNTER — Encounter: Payer: Self-pay | Admitting: Cardiology

## 2021-10-30 ENCOUNTER — Other Ambulatory Visit: Payer: Self-pay

## 2021-10-30 VITALS — BP 118/68 | HR 75 | Ht 62.0 in | Wt 181.8 lb

## 2021-10-30 DIAGNOSIS — Z01812 Encounter for preprocedural laboratory examination: Secondary | ICD-10-CM

## 2021-10-30 DIAGNOSIS — I5022 Chronic systolic (congestive) heart failure: Secondary | ICD-10-CM | POA: Diagnosis not present

## 2021-10-30 DIAGNOSIS — I480 Paroxysmal atrial fibrillation: Secondary | ICD-10-CM | POA: Diagnosis not present

## 2021-10-30 DIAGNOSIS — Z01818 Encounter for other preprocedural examination: Secondary | ICD-10-CM

## 2021-10-30 MED ORDER — LOSARTAN POTASSIUM 100 MG PO TABS: 100.0000 mg | ORAL_TABLET | Freq: Every day | ORAL | 3 refills | Status: DC

## 2021-10-30 NOTE — Patient Instructions (Signed)
Medication Instructions:  Your physician recommends that you continue on your current medications as directed. Please refer to the Current Medication list given to you today.  *If you need a refill on your cardiac medications before your next appointment, please call your pharmacy*   Lab Work: Pre procedure labs 12/23/2021:  BMP & CBC  If you have labs (blood work) drawn today and your tests are completely normal, you will receive your results only by: [place] (if you have MyChart) OR A paper copy in the mail If you have any lab test that is abnormal or we need to change your treatment, we will call you to review the results.   Testing/Procedures: Your physician has requested that you have cardiac CT within 7 days PRIOR to your ablation. Cardiac computed tomography (CT) is a painless test that uses an x-ray machine to take clear, detailed pictures of your heart.  Please follow instruction below located under "other instructions". You will get a call from our office to schedule the date for this test.  Your physician has recommended that you have an ablation. Catheter ablation is a medical procedure used to treat some cardiac arrhythmias (irregular heartbeats). During catheter ablation, a long, thin, flexible tube is put into a blood vessel in your groin (upper thigh), or neck. This tube is called an ablation catheter. It is then guided to your heart through the blood vessel. Radio frequency waves destroy small areas of heart tissue where abnormal heartbeats may cause an arrhythmia to start. Please follow instruction below located under "other instructions".   Follow-Up: At [facility], you and your health needs are our priority.  As part of our continuing mission to provide you with exceptional heart care, we have created designated Provider Care Teams.  These Care Teams include your primary Cardiologist (physician) and Advanced Practice Providers (APPs -  Physician Assistants and  Nurse Practitioners) who all work together to provide you with the care you need, when you need it.  We recommend signing up for the patient portal called "MyChart".  Sign up information is provided on this After Visit Summary.  MyChart is used to connect with patients for Virtual Visits (Telemedicine).  Patients are able to view lab/test results, encounter notes, upcoming appointments, etc.  Non-urgent messages can be sent to your provider as well.   To learn more about what you can do with MyChart, go to [email or web address].    Your next appointment:   1 month(s) after your ablation  The format for your next appointment:   In Person  Provider:   AFib clinic   Thank you for choosing [facility]!!   Trinidad Curet, RN [phone]    Other Instructions   CT INSTRUCTIONS Your cardiac CT will be scheduled at:  [facility] [address] [place], [place] [address] [phone]  Please arrive at the [facility] main entrance of [facility] 30 minutes prior to test start time. Proceed to the [facility] Radiology Department (first floor) to check-in and test prep.   Please follow these instructions carefully (unless otherwise directed):  On the Night Before the Test: Be sure to Drink plenty of water. Do not consume any caffeinated/decaffeinated beverages or chocolate 12 hours prior to your test. Do not take any antihistamines 12 hours prior to your test.  On the Day of the Test: Drink plenty of water until 1 hour prior to the test. Do not eat any food 4 hours prior to the test.  You may take your regular medications prior to the test.  Take Diltiazem 30 mg once 1 hour prior to test. HOLD Furosemide/Hydrochlorothiazide morning of the test. FEMALES- please wear underwire-free bra if available       After the Test: Drink plenty of water. After receiving IV contrast, you may experience a mild flushed feeling. This is normal. On occasion, you  may experience a mild rash up to 24 hours after the test. This is not dangerous. If this occurs, you can take Benadryl 25 mg and increase your fluid intake. If you experience trouble breathing, this can be serious. If it is severe call 911 IMMEDIATELY. If it is mild, please call our office. If you take any of these medications: Glipizide/Metformin, Avandament, Glucavance, please do not take 48 hours after completing test unless otherwise instructed.   Once we have confirmed authorization from your insurance company, we will call you to set up a date and time for your test. Based on how quickly your insurance processes prior authorizations requests, please allow up to 4 weeks to be contacted for scheduling your Cardiac CT appointment. Be advised that routine Cardiac CT appointments could be scheduled as many as 8 weeks after your provider has ordered it.  For non-scheduling related questions, please contact the cardiac imaging nurse navigator should you have any questions/concerns: Marchia Bond, Cardiac Imaging Nurse Navigator Gordy Clement, Cardiac Imaging Nurse Navigator [facility] Direct Office Dial: [phone]   For scheduling needs, including cancellations and rescheduling, please call [place], [phone].      Electrophysiology/Ablation Procedure Instructions   You are scheduled for a(n)  ablation on 01/06/2022 with Dr. Allegra Lai.   1.   Pre procedure testing-             A.  LAB WORK --- On 12/23/2021 for your pre procedure blood work.  You do NOT need to be fasting.  You can stop by the office anytime between  7:30 am - 4:30 pm that day.   On the day of your procedure 01/06/2022 you will go to [facility] [id number]) at 11:30 am.  Dennis Bast will go to the main entrance A [employer]) and enter where the [employer] are.  Your driver will drop you off and you will head down the hallway to ADMITTING.  You may have one support person  come in to the hospital with you.  They will be asked to wait in the waiting room. It is OK to have someone drop you off and come back when you are ready to be discharged.   3.   Do not eat or drink after midnight prior to your procedure.   4.   On the morning of your procedure do NOT take any medication. Do not miss any doses of your blood thinner prior to the morning of your procedure or your procedure will need to be rescheduled.   5.  Plan for an overnight stay but you may be discharged after your procedure, if you use your phone frequently bring your phone charger. If you are discharged after your procedure you will need someone to drive you home and be with you for 24 hours after your procedure.   6. You will follow up with the AFIB clinic 4 weeks after your procedure.  You will follow up with Dr. Curt Bears  3 months after your procedure.  These appointments will be made for you.   7. FYI: For your  safety, and to allow [place] to monitor your vital signs accurately during the surgery/procedure we request that if you have artificial nails, gel coating, SNS etc. Please have those removed prior to your surgery/procedure. Not having the nail coverings /polish removed may result in cancellation or delay of your surgery/procedure.  * If you have ANY questions please call the office [phone] and ask for Jabree Pernice RN or send me a MyChart message   * Occasionally, EP Studies and ablations can become lengthy.  Please make your family aware of this before your procedure starts.  Average time ranges from 2-8 hours for EP studies/ablations.  Your physician will call your family after the procedure with the results.                                   Cardiac Ablation Cardiac ablation is a procedure to destroy (ablate) some heart tissue that is sending bad signals. These bad signals cause problems in heart rhythm. The heart has many areas that make these signals. If there are problems in these areas, they can  make the heart beat in a way that is not normal. Destroying some tissues can help make the heart rhythm normal. Tell your doctor about: Any allergies you have. All medicines you are taking. These include vitamins, herbs, eye drops, creams, and over-the-counter medicines. Any problems you or family members have had with medicines that make you fall asleep (anesthetics). Any blood disorders you have. Any surgeries you have had. Any medical conditions you have, such as kidney failure. Whether you are pregnant or may be pregnant. What are the risks? This is a safe procedure. But problems may occur, including: Infection. Bruising and bleeding. Bleeding into the chest. Stroke or blood clots. Damage to nearby areas of your body. Allergies to medicines or dyes. The need for a pacemaker if the normal system is damaged. Failure of the procedure to treat the problem. What happens before the procedure? Medicines Ask your doctor about: Changing or stopping your normal medicines. This is important. Taking aspirin and ibuprofen. Do not take these medicines unless your doctor tells you to take them. Taking other medicines, vitamins, herbs, and supplements. General instructions Follow instructions from your doctor about what you cannot eat or drink. Plan to have someone take you home from the hospital or clinic. If you will be going home right after the procedure, plan to have someone with you for 24 hours. Ask your doctor what steps will be taken to prevent infection. What happens during the procedure?  An IV tube will be put into one of your veins. You will be given a medicine to help you relax. The skin on your neck or groin will be numbed. A cut (incision) will be made in your neck or groin. A needle will be put through your cut and into a large vein. A tube (catheter) will be put into the needle. The tube will be moved to your heart. Dye may be put through the tube. This helps your doctor  see your heart. Small devices (electrodes) on the tube will send out signals. A type of energy will be used to destroy some heart tissue. The tube will be taken out. Pressure will be held on your cut. This helps stop bleeding. A bandage will be put over your cut. The exact procedure may vary among doctors and hospitals. What happens after the procedure? You will be watched  until you leave the hospital or clinic. This includes checking your heart rate, breathing rate, oxygen, and blood pressure. Your cut will be watched for bleeding. You will need to lie still for a few hours. Do not drive for 24 hours or as long as your doctor tells you. Summary Cardiac ablation is a procedure to destroy some heart tissue. This is done to treat heart rhythm problems. Tell your doctor about any medical conditions you may have. Tell him or her about all medicines you are taking to treat them. This is a safe procedure. But problems may occur. These include infection, bruising, bleeding, and damage to nearby areas of your body. Follow what your doctor tells you about food and drink. You may also be told to change or stop some of your medicines. After the procedure, do not drive for 24 hours or as long as your doctor tells you. This information is not intended to replace advice given to you by your health care provider. Make sure you discuss any questions you have with your health care provider. Document Revised: 08/24/2019 Document Reviewed: 08/24/2019 Elsevier Patient Education  2022 [employer].

## 2021-10-30 NOTE — Progress Notes (Signed)
Electrophysiology Office Note   Date:  10/30/2021   ID:  Victoria, Terry September 28, 1946, MRN [id number]  PCP:  Susy Frizzle, MD  Cardiologist:  Aundra Dubin Primary Electrophysiologist:  Allie Ousley Meredith Leeds, MD    Chief Complaint: AF   History of Present Illness: [name] is a 76 y.o. female who is being seen today for the evaluation of AF at the request of Larey Dresser, MD. Presenting today for electrophysiology evaluation.  She has a history significant for hypertension, type 2 diabetes, heart failure, atrial fibrillation.  She was admitted to the hospital in 2013 with chest pain and a left bundle branch block.  Left heart catheterization showed no coronary artery disease but an ejection fraction of 35 to 40%.  Cardiac MRI showed an ejection fraction of 50% at the time.  In 2016 she developed a left bundle branch block again.  Echo showed ejection fraction down to 35 to 40%.  She was found to be in atrial fibrillation was started on carvedilol and Eliquis.  Unfortunately she has had several more episodes of atrial fibrillation.  She has tried Multaq but did not tolerated due to side effects.  Today, she denies symptoms of palpitations, chest pain, shortness of breath, orthopnea, PND, lower extremity edema, claudication, dizziness, presyncope, syncope, bleeding, or neurologic sequela. The patient is tolerating medications without difficulties.  She feels that she has atrial fibrillation associated with any kind of shot including vaccinations and drainage of joints.  She feels tired and fatigued when she is in atrial fibrillation.   Past Medical History:  Diagnosis Date   Allergy 2000   Allergy history unknown    Arthritis    oa   Asthma    takes acolate for   Breast cancer ([place]) 1999; 2008   hx of bilateral breast cancer   Breast cancer, left breast ([place]) 10/27/2012   3.2 cm ER positive, Her-2 negative, 1 node positive S/P mastectomy/TTRAM reconstruction 4/08   Breast  cancer, right breast ([place]) 10/27/2012   3.2 cm ER/PR positive, 1 node positive, Her-2 negative Rx w mastectomy, AC -T chemo, followed by aromatase inhibitor x 5 years dx 4/08   Cancer ([place])    breast CA   Cataracts, bilateral    CHF (congestive heart failure) (HCC)    Dehydration after exertion    after she went berry picking in the summer heat . see ED visit    Diabetes ([place])    DM2 (diabetes mellitus, type 2) ([place])    on welchol for   Dyspnea    with exertion   Elevated cholesterol    GERD (gastroesophageal reflux disease)    Hiatal hernia    Hypertension    LBBB (left bundle branch block) 10/27/2012   Leg cramps    NICM (nonischemic cardiomyopathy) ([place])    a. Echo:  06/14/12: Poor endocardial definition, possible septal HK, EF 50-55%, normal wall motion, mild LAE ;  b.  [facility] 9/13:  normal cors, EF 35-40%,  c. Echo 7/14: EF 50-55%, mild LAE   Other fatigue 01/01/2015   PONV (postoperative nausea and vomiting)    Pulmonary embolus ([place]) 2001   after tram flap surgery   Past Surgical History:  Procedure Laterality Date   BREAST SURGERY  2000 &2005   DILATION AND CURETTAGE OF UTERUS   65yr ago   x 2   EYE SURGERY  2020   JOINT REPLACEMENT     KNEE CLOSED REDUCTION Right 06/01/2018  Procedure: CLOSED MANIPULATION RIGHT KNEE;  Surgeon: Gaynelle Arabian, MD;  Location: [facility];  Service: Orthopedics;  Laterality: Right;   KNEE CLOSED REDUCTION Left 02/22/2019   Procedure: CLOSED MANIPULATION KNEE;  Surgeon: Gaynelle Arabian, MD;  Location: [facility];  Service: Orthopedics;  Laterality: Left;  67mn   KNEE SURGERY  2006   left torn cartilage repair   LEFT HEART CATHETERIZATION WITH CORONARY ANGIOGRAM N/A 06/15/2012   Procedure: LEFT HEART CATHETERIZATION WITH CORONARY ANGIOGRAM;  Surgeon: MWellington Hampshire MD;  [facility];  Service: Cardiovascular;  Laterality: N/A;   MASTECTOMY     bilateral   RECONSTRUCTION BREAST W/ TRAM FLAP Left 2001   TOTAL KNEE ARTHROPLASTY Right  03/07/2018   Procedure: RIGHT TOTAL KNEE ARTHROPLASTY;  Surgeon: AGaynelle Arabian MD;  Location: [facility];  Service: Orthopedics;  Laterality: Right;   TOTAL KNEE ARTHROPLASTY Left 10/31/2018   Procedure: TOTAL KNEE ARTHROPLASTY;  Surgeon: AGaynelle Arabian MD;  Location: [facility];  Service: Orthopedics;  Laterality: Left;  53m     Current Outpatient Medications  Medication Sig Dispense Refill   ACCU-CHEK AVIVA PLUS test strip USE TO CHECK BLOOD SUGAR EVERY MORNING DX: E11.9 100 strip 3   acetaminophen (TYLENOL) 500 MG tablet Take 1,000 mg by mouth every 6 (six) hours as needed for mild pain or moderate pain.     amoxicillin (AMOXIL) 500 MG capsule For dental appointments     Ascorbic Acid (VITAMIN C WITH ROSE HIPS) 500 MG tablet Take 500 mg by mouth daily.     atorvastatin (LIPITOR) 20 MG tablet TAKE 2 TABLETS (40 MG TOTAL) BY MOUTH EVERY EVENING. 180 tablet 2   Blood Glucose Monitoring Suppl (ACCU-CHEK AVIVA PLUS) w/Device KIT Use to check FBS QAM - DX:E11.9 1 kit 1   Blood Glucose Monitoring Suppl (ONE TOUCH ULTRA 2) W/DEVICE KIT Check FBS qam - Dx:250.00 and she Lavonna Lampron need lancets (1 box) , strips (1 bottle = 100) and controls also thanks 1 each 0   calcium-vitamin D (OSCAL WITH D) 500-200 MG-UNIT TABS tablet Take 1 tablet by mouth daily.     carvedilol (COREG) 12.5 MG tablet TAKE 1 TABLET BY MOUTH IN THE MORNING AND TAKE 1 & 1/2 TABLET BY MOUTH IN THE EVENING 225 tablet 3   clobetasol ointment (TEMOVATE) 0.05 % SMARTSIG:Sparingly Topical Twice Daily     colesevelam (WELCHOL) 625 MG tablet TAAKE 3 TABLETS BY MOUTH TWICE A DAY WITH A MEAL 540 tablet 3   dexlansoprazole (DEXILANT) 60 MG capsule Take 1 capsule (60 mg total) by mouth daily. 90 capsule 1   diltiazem (CARDIZEM) 30 MG tablet TAKE 1 TABLET EVERY 4 HOURS AS NEEDED FOR AFIB HEART RATE >100 45 tablet 1   ELIQUIS 5 MG TABS tablet TAKE 1 TABLET BY MOUTH TWICE A DAY 180 tablet 3   fluticasone (FLONASE) 50 MCG/ACT nasal spray Place 2 sprays  into both nostrils daily as needed for allergies or rhinitis.     Ginkgo Biloba 120 MG TABS Take 120 mg by mouth daily.     Glucosamine-Chondroitin (COSAMIN DS PO) Take 1 tablet by mouth 2 (two) times a day.     Lancets (ACCU-CHEK SOFT TOUCH) lancets Use to check BS QAM DX: E11.9 100 each 3   losartan (COZAAR) 100 MG tablet Take 1 tablet (100 mg total) by mouth daily. 90 tablet 3   Multiple Vitamin (MULTIVITAMIN WITH MINERALS) TABS tablet Take 1 tablet by mouth daily. Senior Multivitamin Iron Free Formula  spironolactone (ALDACTONE) 25 MG tablet TAKE 1 TABLET BY MOUTH EVERY DAY 90 tablet 3   vitamin B-12 (CYANOCOBALAMIN) 1000 MCG tablet Take 1,000 mcg by mouth daily.     zafirlukast (ACCOLATE) 20 MG tablet Take 1 tablet (20 mg total) by mouth 2 (two) times daily. 180 tablet 4   VITAMIN D, CHOLECALCIFEROL, PO Take by mouth daily.      No current facility-administered medications for this visit.    Allergies:   Adhesive [tape], Diphenhydramine, and Vicodin [hydrocodone-acetaminophen]   Social History:  The patient  reports that she has never smoked. She has never used smokeless tobacco. She reports that she does not drink alcohol and does not use drugs.   Family History:  The patient's family history includes Asthma in her maternal grandmother; Breast cancer in her maternal grandmother; Cancer in her maternal aunt; Celiac disease in her maternal aunt; Colon cancer in her cousin and paternal aunt; Diabetes in her maternal uncle; Heart attack in her brother, paternal aunt, paternal grandfather, and paternal uncle; Lung cancer in her paternal aunt; Other in her daughter; Stomach cancer (age of onset: 40) in her maternal grandmother.    ROS:  Please see the history of present illness.   Otherwise, review of systems is positive for none.   All other systems are reviewed and negative.    PHYSICAL EXAM: VS:  BP 118/68    Pulse 75    Ht 5' 2"  (1.575 m)    Wt 181 lb 12.8 oz (82.5 kg)    SpO2 95%     BMI 33.25 kg/m  , BMI Body mass index is 33.25 kg/m. GEN: Well nourished, well developed, in no acute distress  HEENT: normal  Neck: no JVD, carotid bruits, or masses Cardiac: RRR; no murmurs, rubs, or gallops,no edema  Respiratory:  clear to auscultation bilaterally, normal work of breathing GI: soft, nontender, nondistended, + BS MS: no deformity or atrophy  Skin: warm and dry Neuro:  Strength and sensation are intact Psych: euthymic mood, full affect  EKG:  EKG is ordered today. Personal review of the ekg ordered shows sinus rhythm, left bundle branch block  Recent Labs: 06/26/2021: B Natriuretic Peptide 57.8 09/17/2021: BUN 22; Creatinine, Ser 1.00; Hemoglobin 10.8; Platelets 276; Potassium 4.7; Sodium 138    Lipid Panel     Component Value Date/Time   CHOL 97 06/26/2021 1218   TRIG 112 06/26/2021 1218   HDL 41 06/26/2021 1218   CHOLHDL 2.4 06/26/2021 1218   VLDL 22 06/26/2021 1218   LDLCALC 34 06/26/2021 1218   LDLCALC 42 05/23/2020 0924     Wt Readings from Last 3 Encounters:  10/30/21 181 lb 12.8 oz (82.5 kg)  10/08/21 180 lb 9.6 oz (81.9 kg)  09/17/21 179 lb 9.6 oz (81.5 kg)      Other studies Reviewed: Additional studies/ records that were reviewed today include: TTE 09/17/21  Review of the above records today demonstrates:   1. Left ventricular ejection fraction, by estimation, is 40 to 45%. The  left ventricle has mildly decreased function. The left ventricle  demonstrates global hypokinesis with septal-lateral dyssynchrony. Left  ventricular diastolic parameters are  consistent with Grade I diastolic dysfunction (impaired relaxation).   2. Right ventricular systolic function is normal. The right ventricular  size is normal. There is normal pulmonary artery systolic pressure. The  estimated right ventricular systolic pressure is 62.9 mmHg.   3. The mitral valve is normal in structure. Trivial mitral valve  regurgitation. No evidence  of mitral stenosis.  Moderate mitral annular  calcification.   4. The aortic valve is tricuspid. Aortic valve regurgitation is not  visualized. No aortic stenosis is present.   5. The inferior vena cava is normal in size with greater than 50%  respiratory variability, suggesting right atrial pressure of 3 mmHg.    ASSESSMENT AND PLAN:  1.  Paroxysmal atrial fibrillation: Currently on Eliquis 5 mg twice daily.  She has quite a few symptoms when she is in atrial fibrillation.  We discussed further therapy including ablation versus medication management.  She would like to avoid medications if possible.  We Lakeita Panther plan for ablation.  Risk, benefits, and alternatives to EP study and radiofrequency ablation for afib were also discussed in detail today. These risks include but are not limited to stroke, bleeding, vascular damage, tamponade, perforation, damage to the esophagus, lungs, and other structures, pulmonary vein stenosis, worsening renal function, and death. The patient understands these risk and wishes to proceed.  We Mattox Schorr therefore proceed with catheter ablation at the next available time.  Carto, ICE, anesthesia are requested for the procedure.  Gregorey Nabor also obtain CT PV protocol prior to the procedure to exclude LAA thrombus and further evaluate atrial anatomy.   2.  Nonischemic cardiomyopathy: Currently on carvedilol, Entresto, Aldactone.  Ejection fraction 40 to 45%.  Obvious volume overload.  She has stopped her Entresto.  3.  Hyperlipidemia: Continue atorvastatin 40 mg daily  4.  Suspected sleep apnea: Sleep study has been arranged.  Case discussed with primary cardiology  Current medicines are reviewed at length with the patient today.   The patient does not have concerns regarding her medicines.  The following changes were made today:  none  Labs/ tests ordered today include:  Orders Placed This Encounter  Procedures   CT CARDIAC MORPH/PULM VEIN W/CM&W/O CA SCORE   Basic metabolic panel   CBC   EKG  12-Lead     Disposition:   FU with Talik Casique 3 months  Signed, Jermayne Sweeney Meredith Leeds, MD  10/30/2021 3:32 PM     [address] [place] [address] [phone] (office) [phone] (fax)

## 2021-10-30 NOTE — Telephone Encounter (Signed)
° °  Pt said when she got home all her paper works is missing, her AVS and instructions for her upcoming procedure, she believes she might have dropped it in the bathroom, she is concern since it has all her info there

## 2021-10-30 NOTE — Telephone Encounter (Signed)
Arm swelling is not likely to be from [place].  I suppose she could have an allergic reaction with the congestion and itchy eyes but this would be unusual.  If she feels she needs to stop it, would go back to losartan 100 mg daily.

## 2021-10-30 NOTE — Telephone Encounter (Signed)
Pt aware AVS printed and will left at the front desk for her to pick up at her convenience. Pt appreciates my help with this

## 2021-10-30 NOTE — Telephone Encounter (Signed)
Pt is adament sx are r/t entresto and doesn't want to take it anymore, she will restart back on her Losartan 100 mg Daily. Advised if arm does not get better she needs to f/u w/PCP, she is agreeable.

## 2021-11-03 ENCOUNTER — Ambulatory Visit (HOSPITAL_COMMUNITY): Payer: Medicare HMO | Admitting: Physician Assistant

## 2021-11-03 ENCOUNTER — Encounter (HOSPITAL_COMMUNITY): Payer: Self-pay

## 2021-11-03 ENCOUNTER — Ambulatory Visit (HOSPITAL_COMMUNITY): Payer: Medicare Other | Admitting: Physician Assistant

## 2021-12-08 ENCOUNTER — Telehealth: Payer: Self-pay

## 2021-12-08 NOTE — Telephone Encounter (Signed)
Pharmacy faxed refill request for  ? ?zafirlukast (ACCOLATE) 20 MG tablet [[id number]]  ?  Order Details ?Dose: 20 mg Route: Oral Frequency: 2 times daily  ?Dispense Quantity: 180 tablet Refills: 4   ?     ?Sig: Take 1 tablet (20 mg total) by mouth 2 (two) times daily.  ?     ?Start Date: 10/07/21 End Date: --  ?Written Date: 10/07/21 Expiration Date: 10/07/22  ? ?

## 2021-12-09 ENCOUNTER — Other Ambulatory Visit: Payer: Self-pay

## 2021-12-09 MED ORDER — ATORVASTATIN CALCIUM 20 MG PO TABS: 40.0000 mg | ORAL_TABLET | Freq: Every evening | ORAL | 5 refills | Status: DC

## 2021-12-09 MED ORDER — ZAFIRLUKAST 20 MG PO TABS: 20.0000 mg | ORAL_TABLET | Freq: Two times a day (BID) | ORAL | 3 refills | Status: DC

## 2021-12-09 NOTE — Telephone Encounter (Signed)
Refills sent to pharmacy as requested.

## 2021-12-11 ENCOUNTER — Ambulatory Visit (HOSPITAL_COMMUNITY): Payer: Medicare HMO | Admitting: Physician Assistant

## 2021-12-23 ENCOUNTER — Other Ambulatory Visit: Payer: Self-pay

## 2021-12-23 ENCOUNTER — Other Ambulatory Visit: Payer: Medicare HMO | Admitting: *Deleted

## 2021-12-23 DIAGNOSIS — Z01812 Encounter for preprocedural laboratory examination: Secondary | ICD-10-CM

## 2021-12-23 DIAGNOSIS — I480 Paroxysmal atrial fibrillation: Secondary | ICD-10-CM

## 2021-12-23 LAB — CBC
Hematocrit: 31.8 % — ABNORMAL LOW (ref 34.0–46.6)
Hemoglobin: 10.4 g/dL — ABNORMAL LOW (ref 11.1–15.9)
MCH: 30.4 pg (ref 26.6–33.0)
MCHC: 32.7 g/dL (ref 31.5–35.7)
MCV: 93 fL (ref 79–97)
Platelets: 307 10*3/uL (ref 150–450)
RBC: 3.42 x10E6/uL — ABNORMAL LOW (ref 3.77–5.28)
RDW: 12.8 % (ref 11.7–15.4)
WBC: 6.5 10*3/uL (ref 3.4–10.8)

## 2021-12-23 LAB — BASIC METABOLIC PANEL
BUN/Creatinine Ratio: 26 (ref 12–28)
BUN: 28 mg/dL — ABNORMAL HIGH (ref 8–27)
CO2: 20 mmol/L (ref 20–29)
Calcium: 9.3 mg/dL (ref 8.7–10.3)
Chloride: 104 mmol/L (ref 96–106)
Creatinine, Ser: 1.09 mg/dL — ABNORMAL HIGH (ref 0.57–1.00)
Glucose: 115 mg/dL — ABNORMAL HIGH (ref 70–99)
Potassium: 4.3 mmol/L (ref 3.5–5.2)
Sodium: 138 mmol/L (ref 134–144)
eGFR: 52 mL/min/{1.73_m2} — ABNORMAL LOW (ref >59)

## 2021-12-30 ENCOUNTER — Other Ambulatory Visit: Payer: Self-pay | Admitting: Family Medicine

## 2021-12-30 ENCOUNTER — Telehealth (HOSPITAL_COMMUNITY): Payer: Self-pay | Admitting: *Deleted

## 2021-12-30 NOTE — Telephone Encounter (Signed)
Reaching out to patient to offer assistance regarding upcoming cardiac imaging study; pt verbalizes understanding of appt date/time, parking situation and where to check in, pre-test NPO status and medications ordered, and verified current allergies; name and call back number provided for further questions should they arise ? ?Gordy Clement RN Navigator Cardiac Imaging ?[facility] ?[phone] office ?[phone] cell ? ?Patient to take '30mg'$  Cardizem 1 hour prior to cardiac CT scan.  She is aware to arrive at 1pm for her 1:30pm scan. ?

## 2021-12-31 ENCOUNTER — Ambulatory Visit (HOSPITAL_COMMUNITY)
Admission: RE | Admit: 2021-12-31 | Discharge: 2021-12-31 | Disposition: A | Discharge status: Home / Self Care | Payer: Medicare HMO | Source: Ambulatory Visit | Attending: Cardiology | Admitting: Cardiology

## 2021-12-31 DIAGNOSIS — I480 Paroxysmal atrial fibrillation: Secondary | ICD-10-CM | POA: Insufficient documentation

## 2021-12-31 MED ORDER — IOHEXOL 350 MG/ML SOLN
100.0000 mL | Freq: Once | INTRAVENOUS | Status: AC | PRN
  Administered 2021-12-31: 95 mL via INTRAVENOUS

## 2021-12-31 MED ORDER — ATORVASTATIN CALCIUM 40 MG PO TABS: 40.0000 mg | ORAL_TABLET | Freq: Every day | ORAL | 3 refills | Status: DC

## 2021-12-31 NOTE — Telephone Encounter (Signed)
Per chart we have never prescribed Rosuvastatin for patient. ? ?Called [facility] to clarify - they show request was sent for Atorvastatin. Explained on our end it shows Rosuvastatin. She said request should be for Atorvastatin. Rosuvastatin removed from request. ? ?Atorvastatin '40mg'$  1 tab QD sent to pharmacy to replace Atorvastatin '20mg'$  2 tabs QD.  ? ? ?

## 2022-01-05 NOTE — Pre-Procedure Instructions (Signed)
Instructed patient on the following items: Arrival time 1130 Nothing to eat or drink after midnight No meds AM of procedure Responsible person to drive you home and stay with you for 24 hrs  Have you missed any doses of anti-coagulant Eliquis- hasn't missed any doses    

## 2022-01-06 ENCOUNTER — Ambulatory Visit (HOSPITAL_COMMUNITY): Payer: Medicare HMO | Admitting: Anesthesiology

## 2022-01-06 ENCOUNTER — Ambulatory Visit (HOSPITAL_BASED_OUTPATIENT_CLINIC_OR_DEPARTMENT_OTHER): Payer: Medicare HMO | Admitting: Anesthesiology

## 2022-01-06 ENCOUNTER — Encounter (HOSPITAL_COMMUNITY): Payer: Medicare Other

## 2022-01-06 ENCOUNTER — Other Ambulatory Visit: Payer: Self-pay

## 2022-01-06 ENCOUNTER — Encounter (HOSPITAL_COMMUNITY)
Admission: RE | Disposition: A | Discharge status: Home / Self Care | Payer: Self-pay | Source: Home / Self Care | Attending: Cardiology

## 2022-01-06 ENCOUNTER — Ambulatory Visit (HOSPITAL_COMMUNITY)
Admission: RE | Admit: 2022-01-06 | Discharge: 2022-01-06 | Disposition: A | Discharge status: Home / Self Care | Payer: Medicare HMO | Attending: Cardiology | Admitting: Cardiology

## 2022-01-06 DIAGNOSIS — E119 Type 2 diabetes mellitus without complications: Secondary | ICD-10-CM

## 2022-01-06 DIAGNOSIS — Z7901 Long term (current) use of anticoagulants: Secondary | ICD-10-CM | POA: Insufficient documentation

## 2022-01-06 DIAGNOSIS — I480 Paroxysmal atrial fibrillation: Secondary | ICD-10-CM | POA: Diagnosis not present

## 2022-01-06 DIAGNOSIS — I4891 Unspecified atrial fibrillation: Secondary | ICD-10-CM

## 2022-01-06 DIAGNOSIS — I428 Other cardiomyopathies: Secondary | ICD-10-CM | POA: Diagnosis not present

## 2022-01-06 DIAGNOSIS — I110 Hypertensive heart disease with heart failure: Secondary | ICD-10-CM | POA: Diagnosis not present

## 2022-01-06 DIAGNOSIS — I509 Heart failure, unspecified: Secondary | ICD-10-CM | POA: Insufficient documentation

## 2022-01-06 DIAGNOSIS — I5032 Chronic diastolic (congestive) heart failure: Secondary | ICD-10-CM | POA: Diagnosis not present

## 2022-01-06 HISTORY — PX: ATRIAL FIBRILLATION ABLATION: EP1191

## 2022-01-06 LAB — GLUCOSE, CAPILLARY: Glucose-Capillary: 103 mg/dL — ABNORMAL HIGH (ref 70–99)

## 2022-01-06 LAB — POCT ACTIVATED CLOTTING TIME
Activated Clotting Time: 281 seconds
Activated Clotting Time: 353 seconds

## 2022-01-06 SURGERY — ATRIAL FIBRILLATION ABLATION
Anesthesia: General

## 2022-01-06 MED ORDER — HEPARIN (PORCINE) IN NACL 1000-0.9 UT/500ML-% IV SOLN
INTRAVENOUS | Status: DC | PRN
  Administered 2022-01-06 (×5): 500 mL

## 2022-01-06 MED ORDER — SODIUM CHLORIDE 0.9% FLUSH: 3.0000 mL | INTRAVENOUS | Status: DC | PRN

## 2022-01-06 MED ORDER — DEXAMETHASONE SODIUM PHOSPHATE 10 MG/ML IJ SOLN
INTRAMUSCULAR | Status: DC | PRN
Start: 2022-01-06 — End: 2022-01-06
  Administered 2022-01-06: 5 mg via INTRAVENOUS

## 2022-01-06 MED ORDER — ROCURONIUM BROMIDE 10 MG/ML (PF) SYRINGE
PREFILLED_SYRINGE | INTRAVENOUS | Status: DC | PRN
  Administered 2022-01-06: 60 mg via INTRAVENOUS

## 2022-01-06 MED ORDER — PROPOFOL 500 MG/50ML IV EMUL
INTRAVENOUS | Status: DC | PRN
  Administered 2022-01-06: 20 ug/kg/min via INTRAVENOUS

## 2022-01-06 MED ORDER — ONDANSETRON HCL 4 MG/2ML IJ SOLN
INTRAMUSCULAR | Status: DC | PRN
  Administered 2022-01-06: 4 mg via INTRAVENOUS

## 2022-01-06 MED ORDER — HEPARIN SODIUM (PORCINE) 1000 UNIT/ML IJ SOLN
INTRAMUSCULAR | Status: AC
  Filled 2022-01-06: qty 10

## 2022-01-06 MED ORDER — SODIUM CHLORIDE 0.9 % IV SOLN: 250.0000 mL | INTRAVENOUS | Status: DC | PRN

## 2022-01-06 MED ORDER — FENTANYL CITRATE (PF) 250 MCG/5ML IJ SOLN
INTRAMUSCULAR | Status: DC | PRN
  Administered 2022-01-06: 50 ug via INTRAVENOUS

## 2022-01-06 MED ORDER — HEPARIN SODIUM (PORCINE) 1000 UNIT/ML IJ SOLN
INTRAMUSCULAR | Status: DC | PRN
  Administered 2022-01-06: 1000 [IU] via INTRAVENOUS

## 2022-01-06 MED ORDER — ONDANSETRON HCL 4 MG/2ML IJ SOLN: 4.0000 mg | Freq: Four times a day (QID) | INTRAMUSCULAR | Status: DC | PRN

## 2022-01-06 MED ORDER — SUGAMMADEX SODIUM 200 MG/2ML IV SOLN
INTRAVENOUS | Status: DC | PRN
  Administered 2022-01-06: 200 mg via INTRAVENOUS

## 2022-01-06 MED ORDER — PROPOFOL 10 MG/ML IV BOLUS
INTRAVENOUS | Status: DC | PRN
  Administered 2022-01-06: 130 mg via INTRAVENOUS

## 2022-01-06 MED ORDER — HEPARIN SODIUM (PORCINE) 1000 UNIT/ML IJ SOLN
INTRAMUSCULAR | Status: DC | PRN
  Administered 2022-01-06: 14000 [IU] via INTRAVENOUS
  Administered 2022-01-06: 5000 [IU] via INTRAVENOUS

## 2022-01-06 MED ORDER — PHENYLEPHRINE HCL-NACL 20-0.9 MG/250ML-% IV SOLN
INTRAVENOUS | Status: DC | PRN
  Administered 2022-01-06: 20 ug/min via INTRAVENOUS

## 2022-01-06 MED ORDER — DOBUTAMINE INFUSION FOR EP/ECHO/NUC (1000 MCG/ML)
INTRAVENOUS | Status: AC
  Filled 2022-01-06: qty 250

## 2022-01-06 MED ORDER — PROTAMINE SULFATE 10 MG/ML IV SOLN
INTRAVENOUS | Status: DC | PRN
  Administered 2022-01-06: 40 mg via INTRAVENOUS

## 2022-01-06 MED ORDER — ACETAMINOPHEN 500 MG PO TABS
1000.0000 mg | ORAL_TABLET | Freq: Once | ORAL | Status: AC
  Administered 2022-01-06: 1000 mg via ORAL
  Filled 2022-01-06 (×2): qty 2

## 2022-01-06 MED ORDER — SODIUM CHLORIDE 0.9% FLUSH: 3.0000 mL | Freq: Two times a day (BID) | INTRAVENOUS | Status: DC

## 2022-01-06 MED ORDER — LIDOCAINE 2% (20 MG/ML) 5 ML SYRINGE
INTRAMUSCULAR | Status: DC | PRN
  Administered 2022-01-06: 60 mg via INTRAVENOUS

## 2022-01-06 MED ORDER — DOBUTAMINE INFUSION FOR EP/ECHO/NUC (1000 MCG/ML)
INTRAVENOUS | Status: DC | PRN
  Administered 2022-01-06: 20 ug/kg/min via INTRAVENOUS

## 2022-01-06 MED ORDER — SODIUM CHLORIDE 0.9 % IV SOLN: INTRAVENOUS | Status: DC

## 2022-01-06 SURGICAL SUPPLY — 19 items
BAG SNAP BAND KOVER 36X36 (MISCELLANEOUS) ×1 IMPLANT
CATH OCTARAY 2.0 F 3-3-3-3-3 (CATHETERS) ×1 IMPLANT
CATH S CIRCA THERM PROBE 10F (CATHETERS) ×1 IMPLANT
CATH SMTCH THERMOCOOL SF DF (CATHETERS) ×1 IMPLANT
CATH SOUNDSTAR 3D IMAGING (CATHETERS) ×1 IMPLANT
CATH WEBSTER BI DIR CS D-F CRV (CATHETERS) ×1 IMPLANT
CLOSURE PERCLOSE PROSTYLE (VASCULAR PRODUCTS) ×4 IMPLANT
COVER SWIFTLINK CONNECTOR (BAG) ×2 IMPLANT
KIT VERSACROSS STEERABLE D1 (CATHETERS) ×1 IMPLANT
MAT PREVALON FULL STRYKER (MISCELLANEOUS) ×1 IMPLANT
PACK EP LATEX FREE (CUSTOM PROCEDURE TRAY) ×2
PACK EP LF (CUSTOM PROCEDURE TRAY) ×1 IMPLANT
PAD DEFIB RADIO PHYSIO CONN (PAD) ×2 IMPLANT
SHEATH AVANTI 11F 11CM (SHEATH) ×1 IMPLANT
SHEATH CARTO VIZIGO SM CVD (SHEATH) ×1 IMPLANT
SHEATH PINNACLE 7F 10CM (SHEATH) ×1 IMPLANT
SHEATH PINNACLE 8F 10CM (SHEATH) ×2 IMPLANT
SHEATH PROBE COVER 6X72 (BAG) ×1 IMPLANT
TUBING SMART ABLATE COOLFLOW (TUBING) ×1 IMPLANT

## 2022-01-06 NOTE — Transfer of Care (Signed)
Immediate Anesthesia Transfer of Care Note ? ?Patient: [name] ? ?Procedure(s) Performed: ATRIAL FIBRILLATION ABLATION ? ?Patient Location: PACU and Cath Lab ? ?Anesthesia Type:General ? ?Level of Consciousness: drowsy and patient cooperative ? ?Airway & Oxygen Therapy: Patient Spontanous Breathing and Patient connected to nasal cannula oxygen ? ?Post-op Assessment: Report given to RN and Post -op Vital signs reviewed and stable ? ?Post vital signs: Reviewed and stable ? ?Last Vitals:  ?Vitals Value Taken Time  ?BP 141/60 01/06/22 1552  ?Temp    ?Pulse 82 01/06/22 1555  ?Resp 12 01/06/22 1555  ?SpO2 98 % 01/06/22 1555  ?Vitals shown include unvalidated device data. ? ?Last Pain:  ?Vitals:  ? 01/06/22 1552  ?TempSrc:   ?PainSc: 0-No pain  ?   ? ?  ? ?Complications: There were no known notable events for this encounter. ?

## 2022-01-06 NOTE — H&P (Signed)
? ?Electrophysiology Office Note ? ? ?Date:  01/06/2022  ? ?ID:  [name], DOB 09/26/1946, MRN [id number] ? ?PCP:  Susy Frizzle, MD  ?Cardiologist:  Aundra Dubin ?Primary Electrophysiologist:  Cortana Vanderford Meredith Leeds, MD   ? ?Chief Complaint: AF ?  ?History of Present Illness: ?[name] is a 76 y.o. female who is being seen today for the evaluation of AF at the request of No ref. provider found. Presenting today for electrophysiology evaluation. ? ?She has a history significant for hypertension, type 2 diabetes, heart failure, atrial fibrillation.  She was admitted to the hospital in 2013 with chest pain and a left bundle branch block.  Left heart catheterization showed no coronary artery disease but an ejection fraction of 35 to 40%.  Cardiac MRI showed an ejection fraction of 50% at the time.  In 2016 she developed a left bundle branch block again.  Echo showed ejection fraction down to 35 to 40%.  She was found to be in atrial fibrillation was started on carvedilol and Eliquis. ? ?Unfortunately she has had several more episodes of atrial fibrillation.  She has tried Multaq but did not tolerated due to side effects. ? ?Today, denies symptoms of palpitations, chest pain, shortness of breath, orthopnea, PND, lower extremity edema, claudication, dizziness, presyncope, syncope, bleeding, or neurologic sequela. The patient is tolerating medications without difficulties. Plan for AF ablation.  ? ? ?Past Medical History:  ?Diagnosis Date  ? Allergy 2000  ? Allergy history unknown   ? Arthritis   ? oa  ? Asthma   ? takes acolate for  ? Breast cancer [facility]) 1999; 2008  ? hx of bilateral breast cancer  ? Breast cancer, left breast ([place]) 10/27/2012  ? 3.2 cm ER positive, Her-2 negative, 1 node positive S/P mastectomy/TTRAM reconstruction 4/08  ? Breast cancer, right breast ([place]) 10/27/2012  ? 3.2 cm ER/PR positive, 1 node positive, Her-2 negative Rx w mastectomy, AC -T chemo, followed by aromatase inhibitor x 5 years dx  4/08  ? Cancer [facility])   ? breast CA  ? Cataracts, bilateral   ? CHF (congestive heart failure) ([place])   ? Dehydration after exertion   ? after she went berry picking in the summer heat . see ED visit   ? Diabetes ([place])   ? DM2 (diabetes mellitus, type 2) ([place])   ? on welchol for  ? Dyspnea   ? with exertion  ? Elevated cholesterol   ? GERD (gastroesophageal reflux disease)   ? Hiatal hernia   ? Hypertension   ? LBBB (left bundle branch block) 10/27/2012  ? Leg cramps   ? NICM (nonischemic cardiomyopathy) ([place])   ? a. Echo:  06/14/12: Poor endocardial definition, possible septal HK, EF 50-55%, normal wall motion, mild LAE ;  b.  [facility] 9/13:  normal cors, EF 35-40%,  c. Echo 7/14: EF 50-55%, mild LAE  ? Other fatigue 01/01/2015  ? PONV (postoperative nausea and vomiting)   ? Pulmonary embolus ([place]) 2001  ? after tram flap surgery  ? ?Past Surgical History:  ?Procedure Laterality Date  ? BREAST SURGERY  2000 &2005  ? DILATION AND CURETTAGE OF UTERUS   64yr ago  ? x 2  ? EYE SURGERY  2020  ? JOINT REPLACEMENT    ? KNEE CLOSED REDUCTION Right 06/01/2018  ? Procedure: CLOSED MANIPULATION RIGHT KNEE;  Surgeon: AGaynelle Arabian MD;  Location: [facility];  Service: Orthopedics;  Laterality: Right;  ? KNEE CLOSED REDUCTION Left 02/22/2019  ?  Procedure: CLOSED MANIPULATION KNEE;  Surgeon: Gaynelle Arabian, MD;  Location: [facility];  Service: Orthopedics;  Laterality: Left;  77mn  ? KNEE SURGERY  2006  ? left torn cartilage repair  ? LEFT HEART CATHETERIZATION WITH CORONARY ANGIOGRAM N/A 06/15/2012  ? Procedure: LEFT HEART CATHETERIZATION WITH CORONARY ANGIOGRAM;  Surgeon: MWellington Hampshire MD;  [facility];  Service: Cardiovascular;  Laterality: N/A;  ? MASTECTOMY    ? bilateral  ? RECONSTRUCTION BREAST W/ TRAM FLAP Left 2001  ? TOTAL KNEE ARTHROPLASTY Right 03/07/2018  ? Procedure: RIGHT TOTAL KNEE ARTHROPLASTY;  Surgeon: AGaynelle Arabian MD;  Location: [facility];  Service: Orthopedics;  Laterality: Right;  ? TOTAL KNEE ARTHROPLASTY  Left 10/31/2018  ? Procedure: TOTAL KNEE ARTHROPLASTY;  Surgeon: AGaynelle Arabian MD;  Location: [facility];  Service: Orthopedics;  Laterality: Left;  520m  ? ? ? ?Current Facility-Administered Medications  ?Medication Dose Route Frequency Provider Last Rate Last Admin  ? 0.9 %  sodium chloride infusion   Intravenous Continuous CaConstance HawMD 50 mL/hr at 01/06/22 1158 New Bag at 01/06/22 1158  ? ? ?Allergies:   Adhesive [tape], Diphenhydramine, and Vicodin [hydrocodone-acetaminophen]  ? ?Social History:  The patient  reports that she has never smoked. She has never used smokeless tobacco. She reports that she does not drink alcohol and does not use drugs.  ? ?Family History:  The patient's family history includes Asthma in her maternal grandmother; Breast cancer in her maternal grandmother; Cancer in her maternal aunt; Celiac disease in her maternal aunt; Colon cancer in her cousin and paternal aunt; Diabetes in her maternal uncle; Heart attack in her brother, paternal aunt, paternal grandfather, and paternal uncle; Lung cancer in her paternal aunt; Other in her daughter; Stomach cancer (age of onset: 6036in her maternal grandmother.  ? ?ROS:  Please see the history of present illness.   Otherwise, review of systems is positive for none.   All other systems are reviewed and negative.  ? ?PHYSICAL EXAM: ?VS:  BP 129/72   Pulse 71   Temp 97.7 ?F (36.5 ?C) (Oral)   Resp 17   Ht 5' 2"  (1.575 m)   Wt 81.6 kg   SpO2 96%   BMI 32.92 kg/m?  , BMI Body mass index is 32.92 kg/m?. ?GEN: Well nourished, well developed, in no acute distress  ?HEENT: normal  ?Neck: no JVD, carotid bruits, or masses ?Cardiac: RRR; no murmurs, rubs, or gallops,no edema  ?Respiratory:  clear to auscultation bilaterally, normal work of breathing ?GI: soft, nontender, nondistended, + BS ?MS: no deformity or atrophy  ?Skin: warm and dry ?Neuro:  Strength and sensation are intact ?Psych: euthymic mood, full affect ? ? ? ?Recent  Labs: ?06/26/2021: B Natriuretic Peptide 57.8 ?12/23/2021: BUN 28; Creatinine, Ser 1.09; Hemoglobin 10.4; Platelets 307; Potassium 4.3; Sodium 138  ? ? ?Lipid Panel  ?   ?Component Value Date/Time  ? CHOL 97 06/26/2021 1218  ? TRIG 112 06/26/2021 1218  ? HDL 41 06/26/2021 1218  ? CHOLHDL 2.4 06/26/2021 1218  ? VLDL 22 06/26/2021 1218  ? LD[place]4 06/26/2021 1218  ? LD[place]2 05/23/2020 0924  ? ? ? ?Wt Readings from Last 3 Encounters:  ?01/06/22 81.6 kg  ?10/30/21 82.5 kg  ?10/08/21 81.9 kg  ?  ? ? ?Other studies Reviewed: ?Additional studies/ records that were reviewed today include: TTE 09/17/21  ?Review of the above records today demonstrates:  ? 1. Left ventricular ejection fraction, by estimation, is 40  to 45%. The  ?left ventricle has mildly decreased function. The left ventricle  ?demonstrates global hypokinesis with septal-lateral dyssynchrony. Left  ?ventricular diastolic parameters are  ?consistent with Grade I diastolic dysfunction (impaired relaxation).  ? 2. Right ventricular systolic function is normal. The right ventricular  ?size is normal. There is normal pulmonary artery systolic pressure. The  ?estimated right ventricular systolic pressure is 07.2 mmHg.  ? 3. The mitral valve is normal in structure. Trivial mitral valve  ?regurgitation. No evidence of mitral stenosis. Moderate mitral annular  ?calcification.  ? 4. The aortic valve is tricuspid. Aortic valve regurgitation is not  ?visualized. No aortic stenosis is present.  ? 5. The inferior vena cava is normal in size with greater than 50%  ?respiratory variability, suggesting right atrial pressure of 3 mmHg.  ? ? ?ASSESSMENT AND PLAN: ? ?1.  Paroxysmal atrial fibrillation: IMAJEAN MCDERMID has presented today for surgery, with the diagnosis of atrial fibrillation.  The various methods of treatment have been discussed with the patient and family. After consideration of risks, benefits and other options for treatment, the patient has consented to   Procedure(s): ?Catheter ablation as a surgical intervention .  Risks include but not limited to complete heart block, stroke, esophageal damage, nerve damage, bleeding, vascular damage, tamponade, perforation, MI, and de

## 2022-01-06 NOTE — Discharge Instructions (Signed)

## 2022-01-06 NOTE — Anesthesia Preprocedure Evaluation (Addendum)
Anesthesia Evaluation  ?Patient identified by MRN, date of birth, ID band ?Patient awake ? ? ? ?Reviewed: ?Allergy & Precautions, NPO status , Patient's Chart, lab work & pertinent test results, reviewed documented beta blocker date and time  ? ?History of Anesthesia Complications ?(+) PONV and history of anesthetic complications ? ?Airway ?Mallampati: III ? ?TM Distance: >3 FB ?Neck ROM: Full ? ? ? Dental ? ?(+) Dental Advisory Given ?  ?Pulmonary ?asthma ,  ?  ?Pulmonary exam normal ? ? ? ? ? ? ? Cardiovascular ?hypertension, Pt. on home beta blockers and Pt. on medications ?+CHF  ?Normal cardiovascular exam+ dysrhythmias Atrial Fibrillation  ? ? ?'22 TTE - EF 40 to 45%.Global hypokinesis with septal-lateral dyssynchrony. Grade I diastolic dysfunction (impaired relaxation). ?Trivial mitral valve regurgitation. ? ?  ?Neuro/Psych ?negative neurological ROS ? negative psych ROS  ? GI/Hepatic ?Neg liver ROS, hiatal hernia, GERD  Medicated and Controlled,  ?Endo/Other  ?diabetes, Type 2, Oral Hypoglycemic Agents ? Renal/GU ?negative Renal ROS  ? ?  ?Musculoskeletal ? ?(+) Arthritis ,  ? Abdominal ?  ?Peds ? Hematology ? ?On eliquis ?   ?Anesthesia Other Findings ? ? Reproductive/Obstetrics ? ?  ? ? ? ? ? ? ? ? ? ? ? ? ? ?  ?  ? ? ? ? ? ? ? ?Anesthesia Physical ?Anesthesia Plan ? ?ASA: 3 ? ?Anesthesia Plan: General  ? ?Post-op Pain Management: Tylenol PO (pre-op)* and Minimal or no pain anticipated  ? ?Induction: Intravenous ? ?PONV Risk Score and Plan: 4 or greater and Treatment may vary due to age or medical condition, Ondansetron, Propofol infusion and Dexamethasone ? ?Airway Management Planned: Oral ETT ? ?Additional Equipment: None ? ?Intra-op Plan:  ? ?Post-operative Plan: Extubation in OR ? ?Informed Consent: I have reviewed the patients History and Physical, chart, labs and discussed the procedure including the risks, benefits and alternatives for the proposed anesthesia with  the patient or authorized representative who has indicated his/her understanding and acceptance.  ? ? ? ?Dental advisory given ? ?Plan Discussed with: CRNA and Anesthesiologist ? ?Anesthesia Plan Comments:   ? ? ? ? ? ?Anesthesia Quick Evaluation ? ?

## 2022-01-06 NOTE — Anesthesia Procedure Notes (Signed)
Procedure Name: Intubation ?Date/Time: 01/06/2022 2:05 PM ?Performed by: Thelma Comp, CRNA ?Pre-anesthesia Checklist: Patient identified, Emergency Drugs available, Suction available and Patient being monitored ?Patient Re-evaluated:Patient Re-evaluated prior to induction ?Oxygen Delivery Method: Circle System Utilized ?Preoxygenation: Pre-oxygenation with 100% oxygen ?Induction Type: IV induction ?Ventilation: Mask ventilation without difficulty ?Laryngoscope Size: Mac and 3 ?Grade View: Grade I ?Tube type: Oral ?Tube size: 7.0 mm ?Number of attempts: 1 ?Airway Equipment and Method: Stylet ?Placement Confirmation: ETT inserted through vocal cords under direct vision, positive ETCO2 and breath sounds checked- equal and bilateral ?Secured at: 21 cm ?Tube secured with: Tape ?Dental Injury: Teeth and Oropharynx as per pre-operative assessment  ? ? ? ? ?

## 2022-01-07 ENCOUNTER — Encounter (HOSPITAL_COMMUNITY): Payer: Self-pay | Admitting: Cardiology

## 2022-01-07 NOTE — Anesthesia Postprocedure Evaluation (Signed)
Anesthesia Post Note ? ?Patient: [name] ? ?Procedure(s) Performed: ATRIAL FIBRILLATION ABLATION ? ?  ? ?Patient location during evaluation: PACU ?Anesthesia Type: General ?Level of consciousness: awake and alert ?Pain management: pain level controlled ?Vital Signs Assessment: post-procedure vital signs reviewed and stable ?Respiratory status: spontaneous breathing, nonlabored ventilation and respiratory function stable ?Cardiovascular status: stable and blood pressure returned to baseline ?Anesthetic complications: no ? ? ?There were no known notable events for this encounter. ? ?Last Vitals:  ?Vitals:  ? 01/06/22 1915 01/06/22 1930  ?BP:    ?Pulse: 82 82  ?Resp: 14 18  ?Temp:    ?SpO2: 93% 94%  ?  ?Last Pain:  ?Vitals:  ? 01/06/22 1651  ?TempSrc:   ?PainSc: 0-No pain  ? ? ?  ?  ?  ?  ?  ?  ? ?Audry Pili ? ? ? ? ?

## 2022-01-19 ENCOUNTER — Telehealth: Payer: Self-pay | Admitting: Cardiology

## 2022-01-19 NOTE — Telephone Encounter (Signed)
Pt is having "a lot of shortness of breath". ?She is just "huffing and puffing", having issues just bending over/picking something up. ?She "doesn't feel good", further explains she felt better before the ablation earlier this month. ?She "is not in pain, but gasping for air"  (I did not audibly witness this over the phone call with her) ?Symptoms of dizziness/light headedness, but NO syncope. ?She also reports that she is having "trouble with my right leg and having to pick it up to place it in the bed", painful. ?This has been ongoing for 4-5 days. ?States she kept thinking this was going to get better but it isn't. 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 ?HRs were in the 90s when there. ?She feels like they have been running high for a week now. ?No CP/radiating pain. ? ?Will discuss w/ Dr. Curt Bears and let her know his recommendation. ?Aware she may need to f/u w/ AFib clinic sooner than scheduled for in several weeks. ?Pt agreeable to plan. ?

## 2022-01-19 NOTE — Telephone Encounter (Signed)
Pt c/o Shortness Of Breath: STAT if SOB developed within the last 24 hours or pt is noticeably SOB on the phone ? ?1. Are you currently SOB (can you hear that pt is SOB on the phone)? No ? ?2. How long have you been experiencing SOB? 4-5 days ? ?3. Are you SOB when sitting or when up moving around? Moving around ? ?4. Are you currently experiencing any other symptoms? Light headed and dizziness  ? ?

## 2022-01-21 ENCOUNTER — Ambulatory Visit (HOSPITAL_COMMUNITY): Payer: Medicare Other | Admitting: Physician Assistant

## 2022-01-23 ENCOUNTER — Ambulatory Visit (INDEPENDENT_AMBULATORY_CARE_PROVIDER_SITE_OTHER): Payer: Medicare HMO | Admitting: Family Medicine

## 2022-01-23 ENCOUNTER — Ambulatory Visit
Admission: RE | Admit: 2022-01-23 | Discharge: 2022-01-23 | Disposition: A | Discharge status: Home / Self Care | Payer: Medicare HMO | Source: Ambulatory Visit | Attending: Family Medicine | Admitting: Family Medicine

## 2022-01-23 VITALS — BP 126/58 | HR 82 | Temp 98.6°F | Ht 62.0 in | Wt 179.8 lb

## 2022-01-23 DIAGNOSIS — R0602 Shortness of breath: Secondary | ICD-10-CM

## 2022-01-23 NOTE — Progress Notes (Signed)
? ?Subjective:  ? ? Patient ID: [name], female    DOB: July 04, 1946, 76 y.o.   MRN: [id number] ? ?HPI  ?Patient is a very pleasant 76 year old Caucasian female who presents today with dyspnea on exertion.  She has been dealing with dyspnea for some time however she states that it got suddenly worse after her recent ablation.  In December she had an echocardiogram that showed congestive heart failure with an ejection fraction of 40%.  She also had grade 1 diastolic dysfunction.  She recently underwent ablation due to atrial fibrillation.  She states since she had the ablation on April 4, she has been getting more and more winded.  She states that she has to stop to catch her breath just to walk to the mailbox.  She denies any chest pain.  She denies any pleurisy.  She has an occasional cough but is nonproductive.  She denies any significant swelling in her legs.  She only has trace bipedal edema.  Her breath sounds are clear bilaterally.  There is no wheezes crackles or rails.  There is no evidence of an asthma attack.  There is no evidence of pulmonary edema.  She denies any fevers or chills or sickness.  She does not clinically appear to have pneumonia.  She was anemic prior to the ablation with a hemoglobin just above 10.  This has not been checked since. ? ?Past Medical History:  ?Diagnosis Date  ? Allergy 2000  ? Allergy history unknown   ? Arthritis   ? oa  ? Asthma   ? takes acolate for  ? Breast cancer [facility]) 1999; 2008  ? hx of bilateral breast cancer  ? Breast cancer, left breast ([place]) 10/27/2012  ? 3.2 cm ER positive, Her-2 negative, 1 node positive S/P mastectomy/TTRAM reconstruction 4/08  ? Breast cancer, right breast ([place]) 10/27/2012  ? 3.2 cm ER/PR positive, 1 node positive, Her-2 negative Rx w mastectomy, AC -T chemo, followed by aromatase inhibitor x 5 years dx 4/08  ? Cancer [facility])   ? breast CA  ? Cataracts, bilateral   ? CHF (congestive heart failure) ([place])   ? Dehydration after exertion   ?  after she went berry picking in the summer heat . see ED visit   ? Diabetes ([place])   ? DM2 (diabetes mellitus, type 2) ([place])   ? on welchol for  ? Dyspnea   ? with exertion  ? Elevated cholesterol   ? GERD (gastroesophageal reflux disease)   ? Hiatal hernia   ? Hypertension   ? LBBB (left bundle branch block) 10/27/2012  ? Leg cramps   ? NICM (nonischemic cardiomyopathy) ([place])   ? a. Echo:  06/14/12: Poor endocardial definition, possible septal HK, EF 50-55%, normal wall motion, mild LAE ;  b.  [facility] 9/13:  normal cors, EF 35-40%,  c. Echo 7/14: EF 50-55%, mild LAE  ? Other fatigue 01/01/2015  ? PONV (postoperative nausea and vomiting)   ? Pulmonary embolus ([place]) 2001  ? after tram flap surgery  ? ?Past Surgical History:  ?Procedure Laterality Date  ? ATRIAL FIBRILLATION ABLATION N/A 01/06/2022  ? Procedure: ATRIAL FIBRILLATION ABLATION;  Surgeon: Constance Haw, MD;  Location: [place] [facility];  Service: Cardiovascular;  Laterality: N/A;  ? BREAST SURGERY  2000 &2005  ? DILATION AND CURETTAGE OF UTERUS   14yr ago  ? x 2  ? EYE SURGERY  2020  ? JOINT REPLACEMENT    ? KNEE CLOSED REDUCTION  Right 06/01/2018  ? Procedure: CLOSED MANIPULATION RIGHT KNEE;  Surgeon: Gaynelle Arabian, MD;  [facility];  Service: Orthopedics;  Laterality: Right;  ? KNEE CLOSED REDUCTION Left 02/22/2019  ? Procedure: CLOSED MANIPULATION KNEE;  Surgeon: Gaynelle Arabian, MD;  [facility]: [facility];  Service: Orthopedics;  Laterality: Left;  47mn  ? KNEE SURGERY  2006  ? left torn cartilage repair  ? LEFT HEART CATHETERIZATION WITH CORONARY ANGIOGRAM N/A 06/15/2012  ? Procedure: LEFT HEART CATHETERIZATION WITH CORONARY ANGIOGRAM;  Surgeon: MWellington Hampshire MD;  [facility];  Service: Cardiovascular;  Laterality: N/A;  ? MASTECTOMY    ? bilateral  ? RECONSTRUCTION BREAST W/ TRAM FLAP Left 2001  ? TOTAL KNEE ARTHROPLASTY Right 03/07/2018  ? Procedure: RIGHT TOTAL KNEE ARTHROPLASTY;  Surgeon: AGaynelle Arabian MD;  [facility];   Service: Orthopedics;  Laterality: Right;  ? TOTAL KNEE ARTHROPLASTY Left 10/31/2018  ? Procedure: TOTAL KNEE ARTHROPLASTY;  Surgeon: AGaynelle Arabian MD;  [facility];  Service: Orthopedics;  Laterality: Left;  578m  ? ?Current Outpatient Medications on File Prior to Visit  ?Medication Sig Dispense Refill  ? ACCU-CHEK AVIVA PLUS test strip USE TO CHECK BLOOD SUGAR EVERY MORNING DX: E11.9 100 strip 3  ? acetaminophen (TYLENOL) 500 MG tablet Take 1,000 mg by mouth every 6 (six) hours as needed for mild pain or moderate pain.    ? atorvastatin (LIPITOR) 40 MG tablet Take 1 tablet (40 mg total) by mouth daily. (Patient taking differently: Take 40 mg by mouth at bedtime.) 90 tablet 3  ? Blood Glucose Monitoring Suppl (ACCU-CHEK AVIVA PLUS) w/Device KIT Use to check FBS QAM - DX:E11.9 1 kit 1  ? carvedilol (COREG) 12.5 MG tablet TAKE 1 TABLET BY MOUTH IN THE MORNING AND TAKE 1 & 1/2 TABLET BY MOUTH IN THE EVENING 225 tablet 3  ? Cholecalciferol (VITAMIN D3) 50 MCG (2000 UT) CAPS Take 2,000 Units by mouth daily.    ? clobetasol ointment (TEMOVATE) 0.1.61 1 application. daily as needed (Rash).    ? Coenzyme Q10 (CO Q 10 PO) Take 400 mg by mouth daily.    ? colesevelam (WELCHOL) 625 MG tablet TAAKE 3 TABLETS BY MOUTH TWICE A DAY WITH A MEAL 540 tablet 3  ? dexlansoprazole (DEXILANT) 60 MG capsule Take 1 capsule (60 mg total) by mouth daily. 90 capsule 1  ? diltiazem (CARDIZEM) 30 MG tablet TAKE 1 TABLET EVERY 4 HOURS AS NEEDED FOR AFIB HEART RATE >100 45 tablet 1  ? ELIQUIS 5 MG TABS tablet TAKE 1 TABLET BY MOUTH TWICE A DAY 180 tablet 3  ? fluticasone (FLONASE) 50 MCG/ACT nasal spray Place 2 sprays into both nostrils daily as needed for allergies or rhinitis.    ? Ginkgo Biloba 120 MG TABS Take 120 mg by mouth daily.    ? Glucosamine-Chondroitin (COSAMIN DS PO) Take 1 tablet by mouth 2 (two) times a day.    ? Lancets (ACCU-CHEK SOFT TOUCH) lancets Use to check BS QAM DX: E11.9 100 each 3  ? losartan (COZAAR) 100 MG  tablet Take 1 tablet (100 mg total) by mouth daily. 90 tablet 3  ? Magnesium 250 MG TABS Take 250 mg by mouth daily.    ? Multiple Vitamin (MULTIVITAMIN WITH MINERALS) TABS tablet Take 1 tablet by mouth daily. Senior Multivitamin Iron Free Formula    ? Omega-3 Fatty Acids (OMEGA 3 PO) Take 500 mg by mouth 2 (two) times daily.    ? Potassium 99  MG TABS Take by mouth.    ? spironolactone (ALDACTONE) 25 MG tablet TAKE 1 TABLET BY MOUTH EVERY DAY 90 tablet 3  ? vitamin B-12 (CYANOCOBALAMIN) 1000 MCG tablet Take 1,000 mcg by mouth 2 (two) times daily.    ? vitamin C (ASCORBIC ACID) 500 MG tablet Take 500 mg by mouth daily. Ultra C    ? zafirlukast (ACCOLATE) 20 MG tablet Take 1 tablet (20 mg total) by mouth 2 (two) times daily. 180 tablet 3  ? ?No current facility-administered medications on file prior to visit.  ? ?Allergies  ?Allergen Reactions  ? Adhesive [Tape] Other (See Comments)  ?  Skin turns red ?  ? Diphenhydramine Other (See Comments)  ?  Pt feels wired, hyperactive  ? Vicodin [Hydrocodone-Acetaminophen] Nausea And Vomiting  ? ?Social History  ? ?Socioeconomic History  ? Marital status: Married  ?  Spouse name: Orpah Greek  ? Number of children: 2  ? Years of education: Not on file  ? Highest education level: Not on file  ?Occupational History  ? Occupation: retired  ?  Employer: RETIRED  ?  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 Smoking status: Never  ? Smokeless tobacco: Never  ?Vaping Use  ? Vaping Use: Never used  ?Substance and Sexual Activity  ? Alcohol use: Never  ? Drug use: Never  ? Sexual activity: Not Currently  ?  Birth control/protection: None  ?Other Topics Concern  ? Not on file  ?Social History Narrative  ? 1 son and 1 daughter.  ? ?Social Determinants of Health  ? ?Financial Resource Strain: Low Risk   ? Difficulty of Paying Living Expenses: Not hard at all  ?Food Insecurity: No Food Insecurity  ? Worried About Charity fundraiser in the Last Year: Never true  ? Ran Out of Food in the  Last Year: Never true  ?Transportation Needs: No Transportation Needs  ? Lack of Transportation (Medical): No  ? Lack of Transportation (Non-Medical): No  ?Physical Activity: Sufficiently Active  ? Days of

## 2022-01-24 LAB — CBC WITH DIFFERENTIAL/PLATELET
Absolute Monocytes: 490 cells/uL (ref 200–950)
Basophils Absolute: 63 cells/uL (ref 0–200)
Basophils Relative: 0.9 %
Eosinophils Absolute: 196 cells/uL (ref 15–500)
Eosinophils Relative: 2.8 %
HCT: 32 % — ABNORMAL LOW (ref 35.0–45.0)
Hemoglobin: 10.3 g/dL — ABNORMAL LOW (ref 11.7–15.5)
Lymphs Abs: 2170 cells/uL (ref 850–3900)
MCH: 30.1 pg (ref 27.0–33.0)
MCHC: 32.2 g/dL (ref 32.0–36.0)
MCV: 93.6 fL (ref 80.0–100.0)
MPV: 9.7 fL (ref 7.5–12.5)
Monocytes Relative: 7 %
Neutro Abs: 4081 cells/uL (ref 1500–7800)
Neutrophils Relative %: 58.3 %
Platelets: 367 10*3/uL (ref 140–400)
RBC: 3.42 10*6/uL — ABNORMAL LOW (ref 3.80–5.10)
RDW: 12.6 % (ref 11.0–15.0)
Total Lymphocyte: 31 %
WBC: 7 10*3/uL (ref 3.8–10.8)

## 2022-01-24 LAB — BASIC METABOLIC PANEL WITH GFR
BUN/Creatinine Ratio: 26 (calc) — ABNORMAL HIGH (ref 6–22)
BUN: 28 mg/dL — ABNORMAL HIGH (ref 7–25)
CO2: 21 mmol/L (ref 20–32)
Calcium: 9.1 mg/dL (ref 8.6–10.4)
Chloride: 108 mmol/L (ref 98–110)
Creat: 1.08 mg/dL — ABNORMAL HIGH (ref 0.60–1.00)
Glucose, Bld: 126 mg/dL — ABNORMAL HIGH (ref 65–99)
Potassium: 4.6 mmol/L (ref 3.5–5.3)
Sodium: 137 mmol/L (ref 135–146)
eGFR: 53 mL/min/{1.73_m2} — ABNORMAL LOW (ref >=60)

## 2022-01-24 LAB — BRAIN NATRIURETIC PEPTIDE: Brain Natriuretic Peptide: 49 pg/mL (ref <100)

## 2022-01-26 ENCOUNTER — Telehealth (HOSPITAL_COMMUNITY): Payer: Self-pay | Admitting: *Deleted

## 2022-01-26 NOTE — Telephone Encounter (Signed)
Pt doesn't think she is out of rhythm but increased shortness of breath.  Oxygen sats within normal limits, HR in the 90s with exertion but returns to 70s with rest. Appt offered and pt to see [place] tomorrow. ?

## 2022-01-26 NOTE — Telephone Encounter (Signed)
Pt left vm stating she feels worse than she did before her ablation. Pt said she is short of breath and her heart rate goes into the 90s if she tries to walk even a short distance. Pt saw her pcp Friday and per note was not volume overload and no pneumonia. Pt also contacted EP ( see note below). ? ?Routed to afib clinic ? ? ? ? ? ? ?Stanton Kidney, RN  ?Registered Nurse ?Telephone Encounter ?Signed ?Encounter Date:  01/19/2022 ?  ?Signed    ?  ?   ?   ?Pt is having "a lot of shortness of breath". ?She is just "huffing and puffing", having issues just bending over/picking something up. ?She "doesn't feel good", further explains she felt better before the ablation earlier this month. ?She "is not in pain, but gasping for air"  (I did not audibly witness this over the phone call with her) ?Symptoms of dizziness/light headedness, but NO syncope. ?She also reports that she is having "trouble with my right leg and having to pick it up to place it in the bed", painful. ?This has been ongoing for 4-5 days. ?States she kept thinking this was going to get better but it isn't. 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 ?HRs were in the 90s when there. ?She feels like they have been running high for a week now. ?No CP/radiating pain. ?  ?Will discuss w/ Dr. Curt Bears and let her know his recommendation. ?Aware she may need to f/u w/ AFib clinic sooner than scheduled for in several weeks. ?Pt agreeable to plan.  ?  ?  ?  ?Electronically signed by Stanton Kidney, RN at 01/19/2022  6:05 PM ?

## 2022-01-27 ENCOUNTER — Ambulatory Visit (HOSPITAL_COMMUNITY)
Admission: RE | Admit: 2022-01-27 | Discharge: 2022-01-27 | Disposition: A | Discharge status: Home / Self Care | Payer: Medicare HMO | Source: Ambulatory Visit | Attending: Physician Assistant | Admitting: Physician Assistant

## 2022-01-27 ENCOUNTER — Encounter (HOSPITAL_COMMUNITY): Payer: Self-pay | Admitting: Physician Assistant

## 2022-01-27 VITALS — BP 118/82 | HR 85 | Ht 62.0 in | Wt 178.8 lb

## 2022-01-27 DIAGNOSIS — I480 Paroxysmal atrial fibrillation: Secondary | ICD-10-CM | POA: Diagnosis not present

## 2022-01-27 DIAGNOSIS — D6869 Other thrombophilia: Secondary | ICD-10-CM | POA: Insufficient documentation

## 2022-01-27 DIAGNOSIS — I10 Essential (primary) hypertension: Secondary | ICD-10-CM | POA: Diagnosis not present

## 2022-01-27 DIAGNOSIS — E119 Type 2 diabetes mellitus without complications: Secondary | ICD-10-CM | POA: Diagnosis not present

## 2022-01-27 DIAGNOSIS — I428 Other cardiomyopathies: Secondary | ICD-10-CM | POA: Diagnosis not present

## 2022-01-27 DIAGNOSIS — Z79899 Other long term (current) drug therapy: Secondary | ICD-10-CM | POA: Insufficient documentation

## 2022-01-27 DIAGNOSIS — Z6832 Body mass index (BMI) 32.0-32.9, adult: Secondary | ICD-10-CM | POA: Diagnosis not present

## 2022-01-27 DIAGNOSIS — E669 Obesity, unspecified: Secondary | ICD-10-CM | POA: Diagnosis not present

## 2022-01-27 DIAGNOSIS — Z7901 Long term (current) use of anticoagulants: Secondary | ICD-10-CM | POA: Insufficient documentation

## 2022-01-27 DIAGNOSIS — E785 Hyperlipidemia, unspecified: Secondary | ICD-10-CM | POA: Insufficient documentation

## 2022-01-27 NOTE — Progress Notes (Signed)
? ? ?Primary Care Physician: Susy Frizzle, MD ?Primary Cardiologist: Dr Aundra Dubin ?Primary Electrophysiologist: Dr Ileana Ladd ?Referring Physician: Dr Aundra Dubin ? ? ?[name] is a 76 y.o. female with a history of HTN, DM, systolic CHF, asthma, HLD, and paroxysmal atrial fibrillation who presents for follow up in the [facility].  The patient was initially diagnosed with atrial fibrillation 12/2014 after presenting with symptoms of increased dyspnea. Her Jodelle Red showed atrial fibrillation. Patient is on Eliquis for a CHADS2VASC score of 6. On 12/21/19, patient reported that she was "feeling funny" and checked her Jodelle Red which showed rapid afib. She took an extra dose of Coreg but her rapid rates persistent and she presented to the ER. She spontaneously converted in the ER. There were no specific triggers that she could identify. She denies alcohol use. She does have snoring and daytime somnolence.  ? ?On follow up today, patient is s/p afib ablation with Dr Curt Bears on 01/06/22. Patient reports that she did have some throat and chest pain for a couple days but this has resolved. She has had increased symptoms of dyspnea on exertion since the ablation associated with subjective heart racing. The fastest her heart rate has been on her home monitor is 90s. She saw her PCP on 01/23/22 and BNP was within normal limits, CXR did not show any acute process, chronically anemic. She is in SR today.  ? ?Today, she denies symptoms of chest pain, orthopnea, PND, lower extremity edema, dizziness, presyncope, syncope, bleeding, or neurologic sequela. The patient is tolerating medications without difficulties and is otherwise without complaint today.  ? ? ?Atrial Fibrillation Risk Factors: ? ?she does have symptoms or diagnosis of sleep apnea. Declined sleep study. ?she does not have a history of rheumatic fever. ?she does not have a history of alcohol use. ?The patient does not have a history of early  familial atrial fibrillation or other arrhythmias. ? ?she has a BMI of Body mass index is 32.7 kg/m?Marland KitchenMarland Kitchen ?Filed Weights  ? 01/27/22 1027  ?Weight: 81.1 kg  ? ? ? ? ?Family History  ?Problem Relation Age of Onset  ? Asthma Maternal Grandmother   ? Breast cancer Maternal Grandmother   ?     dx. 20s  ? Stomach cancer Maternal Grandmother 60  ?     lots of stomach problems  ? Heart attack Paternal Grandfather   ? Other Daughter   ?     cyst on ovary; unilateral oophorectomy  ? Heart attack Brother   ? Cancer Maternal Aunt   ?     unknown type  ? Celiac disease Maternal Aunt   ? Diabetes Maternal Uncle   ? Colon cancer Cousin   ?     dx. 50s-60s  ? Colon cancer Paternal Aunt   ?     dx. late 59s  ? Lung cancer Paternal Aunt   ?     dx. late 60s-70s; former smoker  ? Heart attack Paternal Aunt   ? Heart attack Paternal Uncle   ? ? ? ?Atrial Fibrillation Management history: ? ?Previous antiarrhythmic drugs: Multaq ?Previous cardioversions: none ?Previous ablations: 01/06/22 ?CHADS2VASC score: 6 ?Anticoagulation history: Eliquis ? ? ?Past Medical History:  ?Diagnosis Date  ? Allergy 2000  ? Allergy history unknown   ? Arthritis   ? oa  ? Asthma   ? takes acolate for  ? Breast cancer [name]) 1999; 2008  ? hx of bilateral breast cancer  ? Breast cancer, left breast ([place])  10/27/2012  ? 3.2 cm ER positive, Her-2 negative, 1 node positive S/P mastectomy/TTRAM reconstruction 4/08  ? Breast cancer, right breast ([place]) 10/27/2012  ? 3.2 cm ER/PR positive, 1 node positive, Her-2 negative Rx w mastectomy, AC -T chemo, followed by aromatase inhibitor x 5 years dx 4/08  ? Cancer [facility])   ? breast CA  ? Cataracts, bilateral   ? CHF (congestive heart failure) ([place])   ? Dehydration after exertion   ? after she went berry picking in the summer heat . see ED visit   ? Diabetes ([place])   ? DM2 (diabetes mellitus, type 2) ([place])   ? on welchol for  ? Dyspnea   ? with exertion  ? Elevated cholesterol   ? GERD (gastroesophageal reflux disease)   ? Hiatal  hernia   ? Hypertension   ? LBBB (left bundle branch block) 10/27/2012  ? Leg cramps   ? NICM (nonischemic cardiomyopathy) ([place])   ? a. Echo:  06/14/12: Poor endocardial definition, possible septal HK, EF 50-55%, normal wall motion, mild LAE ;  b.  [facility] 9/13:  normal cors, EF 35-40%,  c. Echo 7/14: EF 50-55%, mild LAE  ? Other fatigue 01/01/2015  ? PONV (postoperative nausea and vomiting)   ? Pulmonary embolus ([place]) 2001  ? after tram flap surgery  ? ?Past Surgical History:  ?Procedure Laterality Date  ? ATRIAL FIBRILLATION ABLATION N/A 01/06/2022  ? Procedure: ATRIAL FIBRILLATION ABLATION;  Surgeon: Constance Haw, MD;  Location: [place] [facility];  Service: Cardiovascular;  Laterality: N/A;  ? BREAST SURGERY  2000 &2005  ? DILATION AND CURETTAGE OF UTERUS   35yr ago  ? x 2  ? EYE SURGERY  2020  ? JOINT REPLACEMENT    ? KNEE CLOSED REDUCTION Right 06/01/2018  ? Procedure: CLOSED MANIPULATION RIGHT KNEE;  Surgeon: AGaynelle Arabian MD;  [facility];  Service: Orthopedics;  Laterality: Right;  ? KNEE CLOSED REDUCTION Left 02/22/2019  ? Procedure: CLOSED MANIPULATION KNEE;  Surgeon: AGaynelle Arabian MD;  [facility];  Service: Orthopedics;  Laterality: Left;  196m  ? KNEE SURGERY  2006  ? left torn cartilage repair  ? LEFT HEART CATHETERIZATION WITH CORONARY ANGIOGRAM N/A 06/15/2012  ? Procedure: LEFT HEART CATHETERIZATION WITH CORONARY ANGIOGRAM;  Surgeon: MuWellington HampshireMD;  [facility];  Service: Cardiovascular;  Laterality: N/A;  ? MASTECTOMY    ? bilateral  ? RECONSTRUCTION BREAST W/ TRAM FLAP Left 2001  ? TOTAL KNEE ARTHROPLASTY Right 03/07/2018  ? Procedure: RIGHT TOTAL KNEE ARTHROPLASTY;  Surgeon: AlGaynelle ArabianMD;  [facility];  Service: Orthopedics;  Laterality: Right;  ? TOTAL KNEE ARTHROPLASTY Left 10/31/2018  ? Procedure: TOTAL KNEE ARTHROPLASTY;  Surgeon: AlGaynelle ArabianMD;  [facility];  Service: Orthopedics;  Laterality: Left;  507m ? ? ?Current Outpatient  Medications  ?Medication Sig Dispense Refill  ? acetaminophen (TYLENOL) 500 MG tablet Take 1,000 mg by mouth as needed for mild pain or moderate pain.     ? amoxicillin (AMOXIL) 500 MG capsule For dental appointments    ? Ascorbic Acid (VITAMIN C WITH ROSE HIPS) 500 MG tablet Take 500 mg by mouth daily.    ? atorvastatin (LIPITOR) 20 MG tablet TAKE 2 TABLETS (40 MG TOTAL) BY MOUTH EVERY EVENING. 180 tablet 2  ? Blood Glucose Monitoring Suppl (ACCU-CHEK AVIVA PLUS) w/Device KIT Use to check FBS QAM - DX:E11.9 1 kit 1  ? Blood Glucose Monitoring Suppl (ONE TOUCH ULTRA  2) W/DEVICE KIT Check FBS qam - Dx:250.00 and she will need lancets (1 box) , strips (1 bottle = 100) and controls also thanks 1 each 0  ? calcium-vitamin D (OSCAL WITH D) 500-200 MG-UNIT TABS tablet calcium carbonate 500 mg-vitamin D3 5 mcg (200 unit) tablet ?  1 by oral route.    ? carvedilol (COREG) 12.5 MG tablet TAKE 1 TABLET IN THE MORNING AND 1.5 TABLETS IN THE EVENING 225 tablet 3  ? colesevelam (WELCHOL) 625 MG tablet TAAKE 3 TABLETS BY MOUTH TWICE A DAY WITH A MEAL 540 tablet 3  ? DEXILANT 60 MG capsule TAKE ONE CAPSULE BY MOUTH EVERY DAY 90 capsule 3  ? diltiazem (CARDIZEM) 30 MG tablet Take 1 tablet every 4 hours AS NEEDED for AFIB heart rate >100 45 tablet 1  ? ELIQUIS 5 MG TABS tablet TAKE 1 TABLET BY MOUTH TWICE A DAY 180 tablet 3  ? fluticasone (FLONASE) 50 MCG/ACT nasal spray USE 2 SPRAYS IN EACH NOSTRIL DAILY FOR 3 MONTHS 48 g 3  ? Ginkgo Biloba 120 MG TABS Take 120 mg by mouth daily.    ? Glucosamine-Chondroitin (COSAMIN DS PO) Take 1 tablet by mouth 2 (two) times a day.    ? glucose blood test strip Use to check BS QAM DX: E11.9 100 each 3  ? Lancets (ACCU-CHEK SOFT TOUCH) lancets Use to check BS QAM DX: E11.9 100 each 3  ? losartan (COZAAR) 100 MG tablet Take 1 tablet (100 mg total) by mouth daily. 90 tablet 3  ? Multiple Vitamin (MULTIVITAMIN WITH MINERALS) TABS tablet Take 1 tablet by mouth daily. Senior Multivitamin Iron Free  Formula    ? spironolactone (ALDACTONE) 25 MG tablet TAKE 1 TABLET BY MOUTH EVERY DAY 90 tablet 3  ? vitamin B-12 (CYANOCOBALAMIN) 1000 MCG tablet Take 1,000 mcg by mouth daily.    ? VITAMIN D, CHOLECALCIFEROL, [place]

## 2022-02-03 ENCOUNTER — Ambulatory Visit (HOSPITAL_COMMUNITY): Payer: Medicare HMO | Admitting: Physician Assistant

## 2022-02-10 ENCOUNTER — Ambulatory Visit (HOSPITAL_COMMUNITY)
Admission: RE | Admit: 2022-02-10 | Discharge: 2022-02-10 | Disposition: A | Discharge status: Home / Self Care | Payer: Medicare HMO | Source: Ambulatory Visit | Attending: Family Medicine | Admitting: Family Medicine

## 2022-02-10 DIAGNOSIS — I428 Other cardiomyopathies: Secondary | ICD-10-CM | POA: Diagnosis not present

## 2022-02-10 DIAGNOSIS — I110 Hypertensive heart disease with heart failure: Secondary | ICD-10-CM | POA: Diagnosis not present

## 2022-02-10 DIAGNOSIS — I4891 Unspecified atrial fibrillation: Secondary | ICD-10-CM | POA: Insufficient documentation

## 2022-02-10 DIAGNOSIS — I447 Left bundle-branch block, unspecified: Secondary | ICD-10-CM | POA: Diagnosis not present

## 2022-02-10 DIAGNOSIS — I509 Heart failure, unspecified: Secondary | ICD-10-CM | POA: Insufficient documentation

## 2022-02-10 DIAGNOSIS — E119 Type 2 diabetes mellitus without complications: Secondary | ICD-10-CM | POA: Insufficient documentation

## 2022-02-10 DIAGNOSIS — I480 Paroxysmal atrial fibrillation: Secondary | ICD-10-CM

## 2022-02-10 DIAGNOSIS — I517 Cardiomegaly: Secondary | ICD-10-CM | POA: Diagnosis not present

## 2022-02-11 LAB — ECHOCARDIOGRAM COMPLETE
Area-P 1/2: 3.68 cm2
Calc EF: 55.9 %
S' Lateral: 3.3 cm
Single Plane A2C EF: 61.3 %
Single Plane A4C EF: 53.9 %

## 2022-02-16 ENCOUNTER — Ambulatory Visit (HOSPITAL_COMMUNITY)
Admission: RE | Admit: 2022-02-16 | Discharge: 2022-02-16 | Disposition: A | Discharge status: Home / Self Care | Payer: Medicare HMO | Source: Ambulatory Visit | Attending: Physician Assistant | Admitting: Physician Assistant

## 2022-02-16 VITALS — BP 110/62 | HR 88 | Ht 62.0 in | Wt 178.0 lb

## 2022-02-16 DIAGNOSIS — E118 Type 2 diabetes mellitus with unspecified complications: Secondary | ICD-10-CM | POA: Diagnosis not present

## 2022-02-16 DIAGNOSIS — I10 Essential (primary) hypertension: Secondary | ICD-10-CM | POA: Insufficient documentation

## 2022-02-16 DIAGNOSIS — I480 Paroxysmal atrial fibrillation: Secondary | ICD-10-CM

## 2022-02-16 DIAGNOSIS — I428 Other cardiomyopathies: Secondary | ICD-10-CM | POA: Insufficient documentation

## 2022-02-16 DIAGNOSIS — Z6832 Body mass index (BMI) 32.0-32.9, adult: Secondary | ICD-10-CM | POA: Diagnosis not present

## 2022-02-16 DIAGNOSIS — R0683 Snoring: Secondary | ICD-10-CM | POA: Diagnosis not present

## 2022-02-16 DIAGNOSIS — Z7901 Long term (current) use of anticoagulants: Secondary | ICD-10-CM | POA: Insufficient documentation

## 2022-02-16 DIAGNOSIS — D6869 Other thrombophilia: Secondary | ICD-10-CM | POA: Diagnosis not present

## 2022-02-16 DIAGNOSIS — E785 Hyperlipidemia, unspecified: Secondary | ICD-10-CM | POA: Diagnosis not present

## 2022-02-16 DIAGNOSIS — I5022 Chronic systolic (congestive) heart failure: Secondary | ICD-10-CM | POA: Diagnosis not present

## 2022-02-16 DIAGNOSIS — E669 Obesity, unspecified: Secondary | ICD-10-CM | POA: Diagnosis not present

## 2022-02-16 NOTE — Progress Notes (Signed)
? ? ?Primary Care Physician: Susy Frizzle, MD ?Primary Cardiologist: Dr Aundra Dubin ?Primary Electrophysiologist: Dr Ileana Ladd ?Referring Physician: Dr Aundra Dubin ? ? ?[name] is a 76 y.o. female with a history of HTN, DM, systolic CHF, asthma, HLD, and paroxysmal atrial fibrillation who presents for follow up in the [facility].  The patient was initially diagnosed with atrial fibrillation 12/2014 after presenting with symptoms of increased dyspnea. Her Jodelle Red showed atrial fibrillation. Patient is on Eliquis for a CHADS2VASC score of 6. On 12/21/19, patient reported that she was "feeling funny" and checked her Jodelle Red which showed rapid afib. She took an extra dose of Coreg but her rapid rates persistent and she presented to the ER. She spontaneously converted in the ER. There were no specific triggers that she could identify. She denies alcohol use. She does have snoring and daytime somnolence. Patient is s/p afib ablation with Dr Curt Bears on 01/06/22.  ? ?On follow up today, patient continues to have fatigue with exertion but she does state this is slowly improving. She wore a Zio monitor which showed no arrhythmias. Echo showed EF 55-60%. No bleeding problems on anticoagulation.  ? ?Today, she denies symptoms of palpitations, chest pain, orthopnea, PND, lower extremity edema, dizziness, presyncope, syncope, bleeding, or neurologic sequela. The patient is tolerating medications without difficulties and is otherwise without complaint today.  ? ? ?Atrial Fibrillation Risk Factors: ? ?she does have symptoms or diagnosis of sleep apnea. Declined sleep study. ?she does not have a history of rheumatic fever. ?she does not have a history of alcohol use. ?The patient does not have a history of early familial atrial fibrillation or other arrhythmias. ? ?she has a BMI of Body mass index is 32.56 kg/m?Marland KitchenMarland Kitchen ?Filed Weights  ? 02/16/22 1030  ?Weight: 80.7 kg  ? ? ? ?Family History  ?Problem Relation Age  of Onset  ? Asthma Maternal Grandmother   ? Breast cancer Maternal Grandmother   ?     dx. 86s  ? Stomach cancer Maternal Grandmother 60  ?     lots of stomach problems  ? Heart attack Paternal Grandfather   ? Other Daughter   ?     cyst on ovary; unilateral oophorectomy  ? Heart attack Brother   ? Cancer Maternal Aunt   ?     unknown type  ? Celiac disease Maternal Aunt   ? Diabetes Maternal Uncle   ? Colon cancer Cousin   ?     dx. 50s-60s  ? Colon cancer Paternal Aunt   ?     dx. late 63s  ? Lung cancer Paternal Aunt   ?     dx. late 60s-70s; former smoker  ? Heart attack Paternal Aunt   ? Heart attack Paternal Uncle   ? ? ? ?Atrial Fibrillation Management history: ? ?Previous antiarrhythmic drugs: Multaq ?Previous cardioversions: none ?Previous ablations: 01/06/22 ?CHADS2VASC score: 6 ?Anticoagulation history: Eliquis ? ? ?Past Medical History:  ?Diagnosis Date  ? Allergy 2000  ? Allergy history unknown   ? Arthritis   ? oa  ? Asthma   ? takes acolate for  ? Breast cancer [facility]) 1999; 2008  ? hx of bilateral breast cancer  ? Breast cancer, left breast ([place]) 10/27/2012  ? 3.2 cm ER positive, Her-2 negative, 1 node positive S/P mastectomy/TTRAM reconstruction 4/08  ? Breast cancer, right breast ([place]) 10/27/2012  ? 3.2 cm ER/PR positive, 1 node positive, Her-2 negative Rx w mastectomy, AC -T chemo, followed  by aromatase inhibitor x 5 years dx 4/08  ? Cancer [facility])   ? breast CA  ? Cataracts, bilateral   ? CHF (congestive heart failure) ([place])   ? Dehydration after exertion   ? after she went berry picking in the summer heat . see ED visit   ? Diabetes ([place])   ? DM2 (diabetes mellitus, type 2) ([place])   ? on welchol for  ? Dyspnea   ? with exertion  ? Elevated cholesterol   ? GERD (gastroesophageal reflux disease)   ? Hiatal hernia   ? Hypertension   ? LBBB (left bundle branch block) 10/27/2012  ? Leg cramps   ? NICM (nonischemic cardiomyopathy) ([place])   ? a. Echo:  06/14/12: Poor endocardial definition, possible septal HK,  EF 50-55%, normal wall motion, mild LAE ;  b.  [facility] 9/13:  normal cors, EF 35-40%,  c. Echo 7/14: EF 50-55%, mild LAE  ? Other fatigue 01/01/2015  ? PONV (postoperative nausea and vomiting)   ? Pulmonary embolus ([place]) 2001  ? after tram flap surgery  ? ?Past Surgical History:  ?Procedure Laterality Date  ? ATRIAL FIBRILLATION ABLATION N/A 01/06/2022  ? Procedure: ATRIAL FIBRILLATION ABLATION;  Surgeon: Constance Haw, MD;  Location: [place] [facility];  Service: Cardiovascular;  Laterality: N/A;  ? BREAST SURGERY  2000 &2005  ? DILATION AND CURETTAGE OF UTERUS   76yr ago  ? x 2  ? EYE SURGERY  2020  ? JOINT REPLACEMENT    ? KNEE CLOSED REDUCTION Right 06/01/2018  ? Procedure: CLOSED MANIPULATION RIGHT KNEE;  Surgeon: AGaynelle Arabian MD;  [facility];  Service: Orthopedics;  Laterality: Right;  ? KNEE CLOSED REDUCTION Left 02/22/2019  ? Procedure: CLOSED MANIPULATION KNEE;  Surgeon: AGaynelle Arabian MD;  [facility];  Service: Orthopedics;  Laterality: Left;  137m  ? KNEE SURGERY  2006  ? left torn cartilage repair  ? LEFT HEART CATHETERIZATION WITH CORONARY ANGIOGRAM N/A 06/15/2012  ? Procedure: LEFT HEART CATHETERIZATION WITH CORONARY ANGIOGRAM;  Surgeon: MuWellington HampshireMD;  [facility];  Service: Cardiovascular;  Laterality: N/A;  ? MASTECTOMY    ? bilateral  ? RECONSTRUCTION BREAST W/ TRAM FLAP Left 2001  ? TOTAL KNEE ARTHROPLASTY Right 03/07/2018  ? Procedure: RIGHT TOTAL KNEE ARTHROPLASTY;  Surgeon: AlGaynelle ArabianMD;  [facility];  Service: Orthopedics;  Laterality: Right;  ? TOTAL KNEE ARTHROPLASTY Left 10/31/2018  ? Procedure: TOTAL KNEE ARTHROPLASTY;  Surgeon: AlGaynelle ArabianMD;  [facility];  Service: Orthopedics;  Laterality: Left;  5014m ? ? ?Current Outpatient Medications  ?Medication Sig Dispense Refill  ? acetaminophen (TYLENOL) 500 MG tablet Take 1,000 mg by mouth as needed for mild pain or moderate pain.     ? amoxicillin (AMOXIL) 500 MG capsule For dental  appointments    ? Ascorbic Acid (VITAMIN C WITH ROSE HIPS) 500 MG tablet Take 500 mg by mouth daily.    ? atorvastatin (LIPITOR) 20 MG tablet TAKE 2 TABLETS (40 MG TOTAL) BY MOUTH EVERY EVENING. 180 tablet 2  ? Blood Glucose Monitoring Suppl (ACCU-CHEK AVIVA PLUS) w/Device KIT Use to check FBS QAM - DX:E11.9 1 kit 1  ? Blood Glucose Monitoring Suppl (ONE TOUCH ULTRA 2) W/DEVICE KIT Check FBS qam - Dx:250.00 and she will need lancets (1 box) , strips (1 bottle = 100) and controls also thanks 1 each 0  ? calcium-vitamin D (OSCAL WITH D) 500-200 MG-UNIT TABS tablet calcium carbonate 500  mg-vitamin D3 5 mcg (200 unit) tablet ?  1 by oral route.    ? carvedilol (COREG) 12.5 MG tablet TAKE 1 TABLET IN THE MORNING AND 1.5 TABLETS IN THE EVENING 225 tablet 3  ? colesevelam (WELCHOL) 625 MG tablet TAAKE 3 TABLETS BY MOUTH TWICE A DAY WITH A MEAL 540 tablet 3  ? DEXILANT 60 MG capsule TAKE ONE CAPSULE BY MOUTH EVERY DAY 90 capsule 3  ? diltiazem (CARDIZEM) 30 MG tablet Take 1 tablet every 4 hours AS NEEDED for AFIB heart rate >100 45 tablet 1  ? ELIQUIS 5 MG TABS tablet TAKE 1 TABLET BY MOUTH TWICE A DAY 180 tablet 3  ? fluticasone (FLONASE) 50 MCG/ACT nasal spray USE 2 SPRAYS IN EACH NOSTRIL DAILY FOR 3 MONTHS 48 g 3  ? Ginkgo Biloba 120 MG TABS Take 120 mg by mouth daily.    ? Glucosamine-Chondroitin (COSAMIN DS PO) Take 1 tablet by mouth 2 (two) times a day.    ? glucose blood test strip Use to check BS QAM DX: E11.9 100 each 3  ? Lancets (ACCU-CHEK SOFT TOUCH) lancets Use to check BS QAM DX: E11.9 100 each 3  ? losartan (COZAAR) 100 MG tablet Take 1 tablet (100 mg total) by mouth daily. 90 tablet 3  ? Multiple Vitamin (MULTIVITAMIN WITH MINERALS) TABS tablet Take 1 tablet by mouth daily. Senior Multivitamin Iron Free Formula    ? spironolactone (ALDACTONE) 25 MG tablet TAKE 1 TABLET BY MOUTH EVERY DAY 90 tablet 3  ? vitamin B-12 (CYANOCOBALAMIN) 1000 MCG tablet Take 1,000 mcg by mouth daily.    ? VITAMIN D,  CHOLECALCIFEROL, PO Take by mouth daily.     ? zafirlukast (ACCOLATE) 20 MG tablet TAKE 1 TABLET (20 MG TOTAL) BY MOUTH 2 (TWO) TIMES DAILY. 180 tablet 4  ? ?No current facility-administered medications for this encoun

## 2022-02-23 ENCOUNTER — Telehealth (HOSPITAL_COMMUNITY): Payer: Self-pay | Admitting: Internal Medicine

## 2022-02-23 ENCOUNTER — Other Ambulatory Visit: Payer: Self-pay | Admitting: Family Medicine

## 2022-02-23 MED ORDER — SPIRONOLACTONE 25 MG PO TABS: 25.0000 mg | ORAL_TABLET | Freq: Every day | ORAL | 3 refills | Status: DC

## 2022-02-23 NOTE — Telephone Encounter (Signed)
Pt is out of spironolactone, please send [facility] [place], Thanks

## 2022-02-23 NOTE — Telephone Encounter (Signed)
Rx sent in

## 2022-02-24 NOTE — Telephone Encounter (Signed)
Requested Prescriptions  Pending Prescriptions Disp Refills  . colesevelam (WELCHOL) 625 MG tablet [Pharmacy Med Name: COLESEVELAM 625 MG TABLET] 540 tablet 0    Sig: TAKE 3 TABLETS BY MOUTH TWICE A DAY WITH A MEAL     Cardiovascular:  Antilipid - Bile Acid Sequestrants Failed - 02/23/2022  9:02 AM      Failed - Lipid Panel in normal range within the last 12 months    Cholesterol  Date Value Ref Range Status  06/26/2021 97 0 - 200 mg/dL Final   LDL Cholesterol (Calc)  Date Value Ref Range Status  05/23/2020 42 mg/dL (calc) Final    Comment:    Reference range: <100 . Desirable range <100 mg/dL for primary prevention;   <70 mg/dL for patients with CHD or diabetic patients  with > or = 2 CHD risk factors. Marland Kitchen LDL-C is now calculated using the Martin-Hopkins  calculation, which is a validated novel method providing  better accuracy than the Friedewald equation in the  estimation of LDL-C.  Cresenciano Genre et al. Annamaria Helling. 5638;756(43): 2061-2068  (http://education.[employer].com/faq/FAQ164)    LDL Cholesterol  Date Value Ref Range Status  06/26/2021 34 0 - 99 mg/dL Final    Comment:           Total Cholesterol/HDL:CHD Risk Coronary Heart Disease Risk Table                     Men   Women  1/2 Average Risk   3.4   3.3  Average Risk       5.0   4.4  2 X Average Risk   9.6   7.1  3 X Average Risk  23.4   11.0        Use the calculated Patient Ratio above and the CHD Risk Table to determine the patient's CHD Risk.        ATP III CLASSIFICATION (LDL):  <100     mg/dL   Optimal  100-129  mg/dL   Near or Above                    Optimal  130-159  mg/dL   Borderline  160-189  mg/dL   High  >190     mg/dL   Very High Performed at [facility] [address] [place], [place] [address]    HDL  Date Value Ref Range Status  06/26/2021 41 >40 mg/dL Final   Triglycerides  Date Value Ref Range Status  06/26/2021 112 <150 mg/dL Final         Passed - Valid encounter  within last 12 months    Recent Outpatient Visits          1 month ago Shortness of breath   [place] Pickard, Cammie Mcgee, MD   7 months ago Controlled type 2 diabetes mellitus with complication, without long-term current use of insulin ([place])   [place] Susy Frizzle, MD   1 year ago Controlled type 2 diabetes mellitus with complication, without long-term current use of insulin ([place])   [place] Susy Frizzle, MD   2 years ago Controlled type 2 diabetes mellitus with complication, without long-term current use of insulin ([place])   [place] Pickard, Cammie Mcgee, MD   3 years ago Near syncope   [place] Pickard, Cammie Mcgee, MD      Future Appointments  In 1 month [place], Ocie Doyne, MD [facility]           . dexlansoprazole ([place]) 60 MG capsule [Pharmacy Med Name: DEXLANSOPRAZOLE DR 60 MG CAP] 90 capsule 1    Sig: TAKE [place]     Gastroenterology: Proton Pump Inhibitors Passed - 02/23/2022  9:02 AM      Passed - Valid encounter within last 12 months    Recent Outpatient Visits          1 month ago Shortness of breath   [place] Susy Frizzle, MD   7 months ago Controlled type 2 diabetes mellitus with complication, without long-term current use of insulin ([place])   [place] Susy Frizzle, MD   1 year ago Controlled type 2 diabetes mellitus with complication, without long-term current use of insulin ([place])   [place] Susy Frizzle, MD   2 years ago Controlled type 2 diabetes mellitus with complication, without long-term current use of insulin ([place])   [place] Pickard, Cammie Mcgee, MD   3 years ago Near syncope   [place], Cammie Mcgee, MD      Future Appointments            In 1 month [place], Ocie Doyne, MD [facility], [facility]

## 2022-02-26 ENCOUNTER — Ambulatory Visit (INDEPENDENT_AMBULATORY_CARE_PROVIDER_SITE_OTHER): Payer: Medicare HMO | Admitting: Family Medicine

## 2022-02-26 VITALS — BP 115/58 | HR 96 | Temp 97.7°F | Ht 62.0 in | Wt 180.0 lb

## 2022-02-26 DIAGNOSIS — M5431 Sciatica, right side: Secondary | ICD-10-CM

## 2022-02-26 MED ORDER — PREDNISONE 20 MG PO TABS: ORAL_TABLET | ORAL | 0 refills | Status: DC

## 2022-02-26 NOTE — Progress Notes (Signed)
Subjective:    Patient ID: [name], female    DOB: 1946-04-08, 76 y.o.   MRN: [id number]  HPI  Patient complains of pain in her right heel.  At times the pain is located over the greater trochanter.  At other times it is near the sciatic notch.  She complains of weakness in her right leg.  She has a difficult time stepping up onto the exam table due to weakness of the hip extensor muscles.  She does have some tenderness to palpation however over the greater trochanter.  The pain radiates down her leg but does not go below her knee.  The pain is located in the posterior lateral aspect of the leg.  She has normal flexion and extension and internal and external rotation of the hip Past Medical History:  Diagnosis Date   Allergy 2000   Allergy history unknown    Arthritis    oa   Asthma    takes acolate for   Breast cancer ([place]) 1999; 2008   hx of bilateral breast cancer   Breast cancer, left breast ([place]) 10/27/2012   3.2 cm ER positive, Her-2 negative, 1 node positive S/P mastectomy/TTRAM reconstruction 4/08   Breast cancer, right breast ([place]) 10/27/2012   3.2 cm ER/PR positive, 1 node positive, Her-2 negative Rx w mastectomy, AC -T chemo, followed by aromatase inhibitor x 5 years dx 4/08   Cancer ([place])    breast CA   Cataracts, bilateral    CHF (congestive heart failure) ([facility])    Dehydration after exertion    after she went berry picking in the summer heat . see ED visit    Diabetes ([place])    DM2 (diabetes mellitus, type 2) ([place])    on welchol for   Dyspnea    with exertion   Elevated cholesterol    GERD (gastroesophageal reflux disease)    Hiatal hernia    Hypertension    LBBB (left bundle branch block) 10/27/2012   Leg cramps    NICM (nonischemic cardiomyopathy) ([place])    a. Echo:  06/14/12: Poor endocardial definition, possible septal HK, EF 50-55%, normal wall motion, mild LAE ;  b.  [facility] 9/13:  normal cors, EF 35-40%,  c. Echo 7/14: EF 50-55%, mild LAE   Other fatigue  01/01/2015   PONV (postoperative nausea and vomiting)    Pulmonary embolus ([place]) 2001   after tram flap surgery   Past Surgical History:  Procedure Laterality Date   ATRIAL FIBRILLATION ABLATION N/A 01/06/2022   Procedure: ATRIAL FIBRILLATION ABLATION;  Surgeon: Constance Haw, MD;  Location: [place] [facility];  Service: Cardiovascular;  Laterality: N/A;   BREAST SURGERY  2000 &2005   DILATION AND CURETTAGE OF UTERUS   7yr ago   x 2   EYE SURGERY  2020   JOINT REPLACEMENT     KNEE CLOSED REDUCTION Right 06/01/2018   Procedure: CLOSED MANIPULATION RIGHT KNEE;  Surgeon: AGaynelle Arabian MD;  [facility];  Service: Orthopedics;  Laterality: Right;   KNEE CLOSED REDUCTION Left 02/22/2019   Procedure: CLOSED MANIPULATION KNEE;  Surgeon: AGaynelle Arabian MD;  [facility];  Service: Orthopedics;  Laterality: Left;  150m   KNEE SURGERY  2006   left torn cartilage repair   LEFT HEART CATHETERIZATION WITH CORONARY ANGIOGRAM N/A 06/15/2012   Procedure: LEFT HEART CATHETERIZATION WITH CORONARY ANGIOGRAM;  Surgeon: MuWellington HampshireMD;  [facility];  Service: Cardiovascular;  Laterality: N/A;  MASTECTOMY     bilateral   RECONSTRUCTION BREAST W/ TRAM FLAP Left 2001   TOTAL KNEE ARTHROPLASTY Right 03/07/2018   Procedure: RIGHT TOTAL KNEE ARTHROPLASTY;  Surgeon: Gaynelle Arabian, MD;  [facility];  Service: Orthopedics;  Laterality: Right;   TOTAL KNEE ARTHROPLASTY Left 10/31/2018   Procedure: TOTAL KNEE ARTHROPLASTY;  Surgeon: Gaynelle Arabian, MD;  [facility];  Service: Orthopedics;  Laterality: Left;  88mn   Current Outpatient Medications on File Prior to Visit  Medication Sig Dispense Refill   ACCU-CHEK AVIVA PLUS test strip USE TO CHECK BLOOD SUGAR EVERY MORNING DX: E11.9 100 strip 3   acetaminophen (TYLENOL) 500 MG tablet Take 1,000 mg by mouth every 6 (six) hours as needed for mild pain or moderate pain.     atorvastatin (LIPITOR) 40 MG tablet Take 1  tablet (40 mg total) by mouth daily. 90 tablet 3   Blood Glucose Monitoring Suppl (ACCU-CHEK AVIVA PLUS) w/Device KIT Use to check FBS QAM - DX:E11.9 1 kit 1   carvedilol (COREG) 12.5 MG tablet TAKE 1 TABLET BY MOUTH IN THE MORNING AND TAKE 1 & 1/2 TABLET BY MOUTH IN THE EVENING 225 tablet 3   Cholecalciferol (VITAMIN D3) 50 MCG (2000 UT) CAPS Take 2,000 Units by mouth daily.     clobetasol ointment (TEMOVATE) 09.97% 1 application. daily as needed ([place]).     Coenzyme Q10 (CO Q 10 PO) Take 400 mg by mouth daily.     colesevelam (WELCHOL) 625 MG tablet TAKE 3 TABLETS BY MOUTH TWICE A DAY WITH A MEAL 540 tablet 0   dexlansoprazole (DEXILANT) 60 MG capsule TAKE 1 CAPSULE BY MOUTH EVERY DAY 90 capsule 1   diltiazem (CARDIZEM) 30 MG tablet TAKE 1 TABLET EVERY 4 HOURS AS NEEDED FOR AFIB HEART RATE >100 45 tablet 1   ELIQUIS 5 MG TABS tablet TAKE 1 TABLET BY MOUTH TWICE A DAY 180 tablet 3   empagliflozin (JARDIANCE) 25 MG TABS tablet Take 12.5 mg by mouth every morning.     fluticasone (FLONASE) 50 MCG/ACT nasal spray Place 2 sprays into both nostrils daily as needed for allergies or rhinitis.     Ginkgo Biloba 120 MG TABS Take 120 mg by mouth daily.     Glucosamine-Chondroitin (COSAMIN DS PO) Take 1 tablet by mouth 2 (two) times a day.     Lancets (ACCU-CHEK SOFT TOUCH) lancets Use to check BS QAM DX: E11.9 100 each 3   losartan (COZAAR) 100 MG tablet Take 1 tablet (100 mg total) by mouth daily. 90 tablet 3   Magnesium 250 MG TABS Take 250 mg by mouth daily.     Multiple Vitamin (MULTIVITAMIN WITH MINERALS) TABS tablet Take 1 tablet by mouth daily. Senior Multivitamin Iron Free Formula     Omega-3 Fatty Acids (OMEGA 3 PO) Take 500 mg by mouth 2 (two) times daily.     Potassium 99 MG TABS Take 1 tablet by mouth every morning.     spironolactone (ALDACTONE) 25 MG tablet Take 1 tablet (25 mg total) by mouth daily. 90 tablet 3   vitamin B-12 (CYANOCOBALAMIN) 1000 MCG tablet Take 1,000 mcg by mouth 2 (two)  times daily.     vitamin C (ASCORBIC ACID) 500 MG tablet Take 500 mg by mouth daily. Ultra C     zafirlukast (ACCOLATE) 20 MG tablet Take 1 tablet (20 mg total) by mouth 2 (two) times daily. 180 tablet 3   No current facility-administered medications on file prior  to visit.   Allergies  Allergen Reactions   Adhesive [Tape] Other (See Comments)    Skin turns red    Diphenhydramine Other (See Comments)    Pt feels wired, hyperactive   Vicodin [Hydrocodone-Acetaminophen] Nausea And Vomiting   Social History   Socioeconomic History   Marital status: Married    Spouse name: Dwight   Number of children: 2   Years of education: Not on file   Highest education level: Not on file  Occupational History   Occupation: retired    Fish farm manager: RETIRED    Comment: post Furniture conservator/restorer  Tobacco Use   Smoking status: Never   Smokeless tobacco: Never   Tobacco comments:    Never smoke 01/27/22  Vaping Use   Vaping Use: Never used  Substance and Sexual Activity   Alcohol use: Never   Drug use: Never   Sexual activity: Not Currently    Birth control/protection: None  Other Topics Concern   Not on file  Social History Narrative   1 son and 1 daughter.   Social Determinants of Health   Financial Resource Strain: Low Risk    Difficulty of Paying Living Expenses: Not hard at all  Food Insecurity: No Food Insecurity   Worried About Charity fundraiser in the Last Year: Never true   [place] in the Last Year: Never true  Transportation Needs: No Transportation Needs   Lack of Transportation (Medical): No   Lack of Transportation (Non-Medical): No  Physical Activity: Sufficiently Active   Days of Exercise per Week: 5 days   Minutes of Exercise per Session: 30 min  Stress: No Stress Concern Present   Feeling of Stress : Not at all  Social Connections: Moderately Integrated   Frequency of Communication with Friends and Family: More than three times a week   Frequency of  Social Gatherings with Friends and Family: More than three times a week   Attends Religious Services: Never   Marine scientist or Organizations: No   Attends Music therapist: 1 to 4 times per year   Marital Status: Married  Human resources officer Violence: Not At Risk   Fear of Current or Ex-Partner: No   Emotionally Abused: No   Physically Abused: No   Sexually Abused: No      Review of Systems  All other systems reviewed and are negative.     Objective:   Physical Exam Vitals reviewed.  Constitutional:      General: She is not in acute distress.    Appearance: She is well-developed. She is not diaphoretic.  Cardiovascular:     Rate and Rhythm: Normal rate and regular rhythm.     Heart sounds: Murmur heard.    No friction rub. No gallop.  Pulmonary:     Effort: Pulmonary effort is normal. No respiratory distress.     Breath sounds: Normal breath sounds. No stridor. No wheezing or rales.  Musculoskeletal:     Cervical back: Neck supple.     Right hip: Tenderness present. Normal range of motion.     Left hip: No tenderness. Normal range of motion.     Right lower leg: No edema.     Left lower leg: No edema.       Legs:  Skin:    Coloration: Skin is not jaundiced.  Neurological:     General: No focal deficit present.     Mental Status: She is oriented to person, place, and  time. Mental status is at baseline.          Assessment & Plan:  Right sided sciatica - Plan: DG Lumbar Spine Complete I believe the patient may be dealing with sciatica although I cannot rule out bursitis coupled with IT band syndrome as a contributing factor.  Begin a prednisone taper pack and obtain an x-ray of the lumbar spine to evaluate further.

## 2022-02-27 ENCOUNTER — Telehealth: Payer: Self-pay

## 2022-02-27 NOTE — Telephone Encounter (Signed)
FYI  Pt called stating she didn't realize it at the time of OV,02/24/22, however, that the muscle on her r-leg is sore if you press on it from top to bottom.  Pt also stated that the prednisone is helping

## 2022-03-06 ENCOUNTER — Ambulatory Visit (INDEPENDENT_AMBULATORY_CARE_PROVIDER_SITE_OTHER): Payer: Medicare HMO | Admitting: Family Medicine

## 2022-03-06 VITALS — BP 106/70 | HR 81 | Temp 98.3°F | Ht 62.0 in | Wt 179.2 lb

## 2022-03-06 DIAGNOSIS — M79604 Pain in right leg: Secondary | ICD-10-CM | POA: Diagnosis not present

## 2022-03-06 NOTE — Progress Notes (Signed)
Subjective:    Patient ID: [name], female    DOB: January 12, 1946, 76 y.o.   MRN: [id number]  HPI  02/26/22 Patient complains of pain in her right heel.  At times the pain is located over the greater trochanter.  At other times it is near the sciatic notch.  She complains of weakness in her right leg.  She has a difficult time stepping up onto the exam table due to weakness of the hip extensor muscles.  She does have some tenderness to palpation however over the greater trochanter.  The pain radiates down her leg but does not go below her knee.  The pain is located in the posterior lateral aspect of the leg.  She has normal flexion and extension and internal and external rotation of the hip.  At that time, my plan was:  I believe the patient may be dealing with sciatica although I cannot rule out bursitis coupled with IT band syndrome as a contributing factor.  Begin a prednisone taper pack and obtain an x-ray of the lumbar spine to evaluate further.    03/06/22 Patient states that the prednisone made her sick so she had to stop it.  She is no longer complaining of pain in the sciatic notch.  She continues to have some tenderness and pain over the right greater trochanter.  She also has some tenderness and pain over the iliotibial band.  However the pain is very migratory in nature.  She states that it does not hurt every day.  At times she complains of pain and stiffness in her knees.  At other times she complains of pain and stiffness in her thigh muscles and in her hips.  It is very difficult and down exactly what her biggest issue is based on her history.  On her exam she has full range of motion in the right hip with no pain.  She has full flexion and extension of her artificial right knee without pain and she has full range of motion in the right ankle without pain.  There is no tenderness to palpation over the gastrocnemius muscles or the peroneus muscles.  She does have some tenderness palpation  over the iliotibial band.  She has normal strength with knee flexion and hip extension in her hamstrings and normal strength with knee extension and her quadriceps.  Therefore the only abnormality I can elicit on her exam is some tenderness over the greater trochanter and iliotibial band Past Medical History:  Diagnosis Date   Allergy 2000   Allergy history unknown    Arthritis    oa   Asthma    takes acolate for   Breast cancer ([place]) 1999; 2008   hx of bilateral breast cancer   Breast cancer, left breast ([place]) 10/27/2012   3.2 cm ER positive, Her-2 negative, 1 node positive S/P mastectomy/TTRAM reconstruction 4/08   Breast cancer, right breast ([place]) 10/27/2012   3.2 cm ER/PR positive, 1 node positive, Her-2 negative Rx w mastectomy, AC -T chemo, followed by aromatase inhibitor x 5 years dx 4/08   Cancer ([place])    breast CA   Cataracts, bilateral    CHF (congestive heart failure) ([place])    Dehydration after exertion    after she went berry picking in the summer heat . see ED visit    Diabetes ([place])    DM2 (diabetes mellitus, type 2) ([place])    on welchol for   Dyspnea    with exertion  Elevated cholesterol    GERD (gastroesophageal reflux disease)    Hiatal hernia    Hypertension    LBBB (left bundle branch block) 10/27/2012   Leg cramps    NICM (nonischemic cardiomyopathy) ([place])    a. Echo:  06/14/12: Poor endocardial definition, possible septal HK, EF 50-55%, normal wall motion, mild LAE ;  b.  [facility] 9/13:  normal cors, EF 35-40%,  c. Echo 7/14: EF 50-55%, mild LAE   Other fatigue 01/01/2015   PONV (postoperative nausea and vomiting)    Pulmonary embolus ([place]) 2001   after tram flap surgery   Past Surgical History:  Procedure Laterality Date   ATRIAL FIBRILLATION ABLATION N/A 01/06/2022   Procedure: ATRIAL FIBRILLATION ABLATION;  Surgeon: Constance Haw, MD;  Location: [place] [facility];  Service: Cardiovascular;  Laterality: N/A;   BREAST SURGERY  2000 &2005   DILATION  AND CURETTAGE OF UTERUS   88yr ago   x 2   EYE SURGERY  2020   JOINT REPLACEMENT     KNEE CLOSED REDUCTION Right 06/01/2018   Procedure: CLOSED MANIPULATION RIGHT KNEE;  Surgeon: AGaynelle Arabian MD;  [facility];  Service: Orthopedics;  Laterality: Right;   KNEE CLOSED REDUCTION Left 02/22/2019   Procedure: CLOSED MANIPULATION KNEE;  Surgeon: AGaynelle Arabian MD;  [facility];  Service: Orthopedics;  Laterality: Left;  169m   KNEE SURGERY  2006   left torn cartilage repair   LEFT HEART CATHETERIZATION WITH CORONARY ANGIOGRAM N/A 06/15/2012   Procedure: LEFT HEART CATHETERIZATION WITH CORONARY ANGIOGRAM;  Surgeon: MuWellington HampshireMD;  [facility];  Service: Cardiovascular;  Laterality: N/A;   MASTECTOMY     bilateral   RECONSTRUCTION BREAST W/ TRAM FLAP Left 2001   TOTAL KNEE ARTHROPLASTY Right 03/07/2018   Procedure: RIGHT TOTAL KNEE ARTHROPLASTY;  Surgeon: AlGaynelle ArabianMD;  [facility];  Service: Orthopedics;  Laterality: Right;   TOTAL KNEE ARTHROPLASTY Left 10/31/2018   Procedure: TOTAL KNEE ARTHROPLASTY;  Surgeon: AlGaynelle ArabianMD;  [facility];  Service: Orthopedics;  Laterality: Left;  5020m  Current Outpatient Medications on File Prior to Visit  Medication Sig Dispense Refill   ACCU-CHEK AVIVA PLUS test strip USE TO CHECK BLOOD SUGAR EVERY MORNING DX: E11.9 100 strip 3   acetaminophen (TYLENOL) 500 MG tablet Take 1,000 mg by mouth every 6 (six) hours as needed for mild pain or moderate pain.     atorvastatin (LIPITOR) 40 MG tablet Take 1 tablet (40 mg total) by mouth daily. 90 tablet 3   Blood Glucose Monitoring Suppl (ACCU-CHEK AVIVA PLUS) w/Device KIT Use to check FBS QAM - DX:E11.9 1 kit 1   carvedilol (COREG) 12.5 MG tablet TAKE 1 TABLET BY MOUTH IN THE MORNING AND TAKE 1 & 1/2 TABLET BY MOUTH IN THE EVENING 225 tablet 3   Cholecalciferol (VITAMIN D3) 50 MCG (2000 UT) CAPS Take 2,000 Units by mouth daily.     clobetasol ointment (TEMOVATE)  0.00.271 application. daily as needed (Rash).     Coenzyme Q10 (CO Q 10 PO) Take 400 mg by mouth daily.     colesevelam (WELCHOL) 625 MG tablet TAKE 3 TABLETS BY MOUTH TWICE A DAY WITH A MEAL 540 tablet 0   dexlansoprazole (DEXILANT) 60 MG capsule TAKE 1 CAPSULE BY MOUTH EVERY DAY 90 capsule 1   diltiazem (CARDIZEM) 30 MG tablet TAKE 1 TABLET EVERY 4 HOURS AS NEEDED FOR AFIB HEART RATE >100 45 tablet 1  ELIQUIS 5 MG TABS tablet TAKE 1 TABLET BY MOUTH TWICE A DAY 180 tablet 3   empagliflozin (JARDIANCE) 25 MG TABS tablet Take 12.5 mg by mouth every morning.     fluticasone (FLONASE) 50 MCG/ACT nasal spray Place 2 sprays into both nostrils daily as needed for allergies or rhinitis.     Ginkgo Biloba 120 MG TABS Take 120 mg by mouth daily.     Glucosamine-Chondroitin (COSAMIN DS PO) Take 1 tablet by mouth 2 (two) times a day.     Lancets (ACCU-CHEK SOFT TOUCH) lancets Use to check BS QAM DX: E11.9 100 each 3   Magnesium 250 MG TABS Take 250 mg by mouth daily.     Multiple Vitamin (MULTIVITAMIN WITH MINERALS) TABS tablet Take 1 tablet by mouth daily. Senior Multivitamin Iron Free Formula     Omega-3 Fatty Acids (OMEGA 3 PO) Take 500 mg by mouth 2 (two) times daily.     Potassium 99 MG TABS Take 1 tablet by mouth every morning.     predniSONE (DELTASONE) 20 MG tablet 3 tabs poqday 1-2, 2 tabs poqday 3-4, 1 tab poqday 5-6 12 tablet 0   spironolactone (ALDACTONE) 25 MG tablet Take 1 tablet (25 mg total) by mouth daily. 90 tablet 3   vitamin B-12 (CYANOCOBALAMIN) 1000 MCG tablet Take 1,000 mcg by mouth 2 (two) times daily.     vitamin C (ASCORBIC ACID) 500 MG tablet Take 500 mg by mouth daily. Ultra C     zafirlukast (ACCOLATE) 20 MG tablet Take 1 tablet (20 mg total) by mouth 2 (two) times daily. 180 tablet 3   losartan (COZAAR) 100 MG tablet Take 1 tablet (100 mg total) by mouth daily. 90 tablet 3   No current facility-administered medications on file prior to visit.   Allergies  Allergen  Reactions   Adhesive [Tape] Other (See Comments)    Skin turns red    Diphenhydramine Other (See Comments)    Pt feels wired, hyperactive   Vicodin [Hydrocodone-Acetaminophen] Nausea And Vomiting   Social History   Socioeconomic History   Marital status: Married    Spouse name: Dwight   Number of children: 2   Years of education: Not on file   Highest education level: Not on file  Occupational History   Occupation: retired    Fish farm manager: RETIRED    Comment: post Furniture conservator/restorer  Tobacco Use   Smoking status: Never   Smokeless tobacco: Never   Tobacco comments:    Never smoke 01/27/22  Vaping Use   Vaping Use: Never used  Substance and Sexual Activity   Alcohol use: Never   Drug use: Never   Sexual activity: Not Currently    Birth control/protection: None  Other Topics Concern   Not on file  Social History Narrative   1 son and 1 daughter.   Social Determinants of Health   Financial Resource Strain: Low Risk    Difficulty of Paying Living Expenses: Not hard at all  Food Insecurity: No Food Insecurity   Worried About Charity fundraiser in the Last Year: Never true   [place] in the Last Year: Never true  Transportation Needs: No Transportation Needs   Lack of Transportation (Medical): No   Lack of Transportation (Non-Medical): No  Physical Activity: Sufficiently Active   Days of Exercise per Week: 5 days   Minutes of Exercise per Session: 30 min  Stress: No Stress Concern Present   Feeling of Stress : Not  at all  Social Connections: Moderately Integrated   Frequency of Communication with Friends and Family: More than three times a week   Frequency of Social Gatherings with Friends and Family: More than three times a week   Attends Religious Services: Never   Marine scientist or Organizations: No   Attends Music therapist: 1 to 4 times per year   Marital Status: Married  Human resources officer Violence: Not At Risk   Fear of Current  or Ex-Partner: No   Emotionally Abused: No   Physically Abused: No   Sexually Abused: No      Review of Systems  All other systems reviewed and are negative.     Objective:   Physical Exam Vitals reviewed.  Constitutional:      General: She is not in acute distress.    Appearance: She is well-developed. She is not diaphoretic.  Cardiovascular:     Rate and Rhythm: Normal rate and regular rhythm.     Heart sounds: Murmur heard.    No friction rub. No gallop.  Pulmonary:     Effort: Pulmonary effort is normal. No respiratory distress.     Breath sounds: Normal breath sounds. No stridor. No wheezing or rales.  Musculoskeletal:     Cervical back: Neck supple.     Right hip: Tenderness present. Normal range of motion.     Left hip: No tenderness. Normal range of motion.     Right lower leg: No edema.     Left lower leg: No edema.       Legs:  Skin:    Coloration: Skin is not jaundiced.  Neurological:     General: No focal deficit present.     Mental Status: She is oriented to person, place, and time. Mental status is at baseline.          Assessment & Plan:  Right leg pain Her history is very difficult to pinpoint the biggest issue.  She does not consistently hurt anyone location.  A lot of her pain seems migratory and sounds like general aches and pains.  Her biggest issue seems to be decreased mobility and trouble walking.  However her exam today does show some tenderness over the greater trochanter and iliotibial band.  Best the only abnormality I can elicit on her exam.  She has an appointment next week to see her orthopedist.  I do not inform bursa injections in the hip.  I told the patient my suspicion.  I recommended trying a cortisone injection for bursitis in the hip and a referral for physical therapy due to the pain and stiffness in her iliotibial band and in the muscles of the leg.  However I will defer to orthopedics expert assessment as I am uncertain.  She will  discuss the situation with them and see their opinion

## 2022-03-17 LAB — HM DIABETES EYE EXAM

## 2022-04-12 NOTE — Progress Notes (Unsigned)
Electrophysiology Office Note   Date:  04/12/2022   ID:  Saide, Lanuza 1946-07-19, MRN [id number]  PCP:  Susy Frizzle, MD  Cardiologist:  Aundra Dubin Primary Electrophysiologist:  Kemal Amores Meredith Leeds, MD    Chief Complaint: AF   History of Present Illness: KACIE HUXTABLE is a 76 y.o. female who is being seen today for the evaluation of AF at the request of Susy Frizzle, MD. Presenting today for electrophysiology evaluation.  She has a history significant for hypertension, type 2 diabetes, heart failure, atrial fibrillation.  She is admitted to the hospital in 2013 with chest pain and left bundle branch block.  Heart catheterization showed no coronary artery disease but ejection fraction of 35 to 40%.  Cardiac MRI showed ejection fraction of 50% of the time.  In 2016 she had left bundle branch block again.  Echo showed an ejection fraction of 35 to 40%.  She was found to be in atrial fibrillation and was started on carvedilol and Eliquis.  She had more episodes of atrial fibrillation and was started on Multaq but did not tolerate this due to side effects.  She is status post atrial fibrillation ablation 01/06/2022.  Today, denies symptoms of palpitations, chest pain, shortness of breath, orthopnea, PND, lower extremity edema, claudication, dizziness, presyncope, syncope, bleeding, or neurologic sequela. The patient is tolerating medications without difficulties. ***   Today, denies symptoms of palpitations, chest pain, shortness of breath, orthopnea, PND, lower extremity edema, claudication, dizziness, presyncope, syncope, bleeding, or neurologic sequela. The patient is tolerating medications without difficulties. ***    Past Medical History:  Diagnosis Date   Allergy 2000   Allergy history unknown    Arthritis    oa   Asthma    takes acolate for   Breast cancer ([place]) 1999; 2008   hx of bilateral breast cancer   Breast cancer, left breast ([place]) 10/27/2012   3.2 cm ER  positive, Her-2 negative, 1 node positive S/P mastectomy/TTRAM reconstruction 4/08   Breast cancer, right breast ([place]) 10/27/2012   3.2 cm ER/PR positive, 1 node positive, Her-2 negative Rx w mastectomy, AC -T chemo, followed by aromatase inhibitor x 5 years dx 4/08   Cancer ([place])    breast CA   Cataracts, bilateral    CHF (congestive heart failure) ([facility])    Dehydration after exertion    after she went berry picking in the summer heat . see ED visit    Diabetes ([place])    DM2 (diabetes mellitus, type 2) ([place])    on welchol for   Dyspnea    with exertion   Elevated cholesterol    GERD (gastroesophageal reflux disease)    Hiatal hernia    Hypertension    LBBB (left bundle branch block) 10/27/2012   Leg cramps    NICM (nonischemic cardiomyopathy) ([place])    a. Echo:  06/14/12: Poor endocardial definition, possible septal HK, EF 50-55%, normal wall motion, mild LAE ;  b.  [facility] 9/13:  normal cors, EF 35-40%,  c. Echo 7/14: EF 50-55%, mild LAE   Other fatigue 01/01/2015   PONV (postoperative nausea and vomiting)    Pulmonary embolus ([place]) 2001   after tram flap surgery   Past Surgical History:  Procedure Laterality Date   ATRIAL FIBRILLATION ABLATION N/A 01/06/2022   Procedure: ATRIAL FIBRILLATION ABLATION;  Surgeon: Constance Haw, MD;  Location: [place] [facility];  Service: Cardiovascular;  Laterality: N/A;   BREAST SURGERY  2000 &2005  DILATION AND CURETTAGE OF UTERUS   34yr ago   x 2   EYE SURGERY  2020   JOINT REPLACEMENT     KNEE CLOSED REDUCTION Right 06/01/2018   Procedure: CLOSED MANIPULATION RIGHT KNEE;  Surgeon: AGaynelle Arabian MD;  [facility];  Service: Orthopedics;  Laterality: Right;   KNEE CLOSED REDUCTION Left 02/22/2019   Procedure: CLOSED MANIPULATION KNEE;  Surgeon: AGaynelle Arabian MD;  [facility];  Service: Orthopedics;  Laterality: Left;  149m   KNEE SURGERY  2006   left torn cartilage repair   LEFT HEART CATHETERIZATION WITH CORONARY ANGIOGRAM  N/A 06/15/2012   Procedure: LEFT HEART CATHETERIZATION WITH CORONARY ANGIOGRAM;  Surgeon: MuWellington HampshireMD;  [facility];  Service: Cardiovascular;  Laterality: N/A;   MASTECTOMY     bilateral   RECONSTRUCTION BREAST W/ TRAM FLAP Left 2001   TOTAL KNEE ARTHROPLASTY Right 03/07/2018   Procedure: RIGHT TOTAL KNEE ARTHROPLASTY;  Surgeon: AlGaynelle ArabianMD;  [facility];  Service: Orthopedics;  Laterality: Right;   TOTAL KNEE ARTHROPLASTY Left 10/31/2018   Procedure: TOTAL KNEE ARTHROPLASTY;  Surgeon: AlGaynelle ArabianMD;  [facility];  Service: Orthopedics;  Laterality: Left;  5052m    Current Outpatient Medications  Medication Sig Dispense Refill   ACCU-CHEK AVIVA PLUS test strip USE TO CHECK BLOOD SUGAR EVERY MORNING DX: E11.9 100 strip 3   acetaminophen (TYLENOL) 500 MG tablet Take 1,000 mg by mouth every 6 (six) hours as needed for mild pain or moderate pain.     atorvastatin (LIPITOR) 40 MG tablet Take 1 tablet (40 mg total) by mouth daily. 90 tablet 3   Blood Glucose Monitoring Suppl (ACCU-CHEK AVIVA PLUS) w/Device KIT Use to check FBS QAM - DX:E11.9 1 kit 1   carvedilol (COREG) 12.5 MG tablet TAKE 1 TABLET BY MOUTH IN THE MORNING AND TAKE 1 & 1/2 TABLET BY MOUTH IN THE EVENING 225 tablet 3   Cholecalciferol (VITAMIN D3) 50 MCG (2000 UT) CAPS Take 2,000 Units by mouth daily.     clobetasol ointment (TEMOVATE) 0.02.631 application. daily as needed (Rash).     Coenzyme Q10 (CO Q 10 PO) Take 400 mg by mouth daily.     colesevelam (WELCHOL) 625 MG tablet TAKE 3 TABLETS BY MOUTH TWICE A DAY WITH A MEAL 540 tablet 0   dexlansoprazole (DEXILANT) 60 MG capsule TAKE 1 CAPSULE BY MOUTH EVERY DAY 90 capsule 1   diltiazem (CARDIZEM) 30 MG tablet TAKE 1 TABLET EVERY 4 HOURS AS NEEDED FOR AFIB HEART RATE >100 45 tablet 1   ELIQUIS 5 MG TABS tablet TAKE 1 TABLET BY MOUTH TWICE A DAY 180 tablet 3   empagliflozin (JARDIANCE) 25 MG TABS tablet Take 12.5 mg by mouth every morning.      fluticasone (FLONASE) 50 MCG/ACT nasal spray Place 2 sprays into both nostrils daily as needed for allergies or rhinitis.     Ginkgo Biloba 120 MG TABS Take 120 mg by mouth daily.     Glucosamine-Chondroitin (COSAMIN DS PO) Take 1 tablet by mouth 2 (two) times a day.     Lancets (ACCU-CHEK SOFT TOUCH) lancets Use to check BS QAM DX: E11.9 100 each 3   losartan (COZAAR) 100 MG tablet Take 1 tablet (100 mg total) by mouth daily. 90 tablet 3   Magnesium 250 MG TABS Take 250 mg by mouth daily.     Multiple Vitamin (MULTIVITAMIN WITH MINERALS) TABS tablet Take 1 tablet by  mouth daily. Senior Multivitamin Iron Free Formula     Omega-3 Fatty Acids (OMEGA 3 PO) Take 500 mg by mouth 2 (two) times daily.     Potassium 99 MG TABS Take 1 tablet by mouth every morning.     predniSONE (DELTASONE) 20 MG tablet 3 tabs poqday 1-2, 2 tabs poqday 3-4, 1 tab poqday 5-6 12 tablet 0   spironolactone (ALDACTONE) 25 MG tablet Take 1 tablet (25 mg total) by mouth daily. 90 tablet 3   vitamin B-12 (CYANOCOBALAMIN) 1000 MCG tablet Take 1,000 mcg by mouth 2 (two) times daily.     vitamin C (ASCORBIC ACID) 500 MG tablet Take 500 mg by mouth daily. Ultra C     zafirlukast (ACCOLATE) 20 MG tablet Take 1 tablet (20 mg total) by mouth 2 (two) times daily. 180 tablet 3   No current facility-administered medications for this visit.    Allergies:   Adhesive [tape], Diphenhydramine, and Vicodin [hydrocodone-acetaminophen]   Social History:  The patient  reports that she has never smoked. She has never used smokeless tobacco. She reports that she does not drink alcohol and does not use drugs.   Family History:  The patient's family history includes Asthma in her maternal grandmother; Breast cancer in her maternal grandmother; Cancer in her maternal aunt; Celiac disease in her maternal aunt; Colon cancer in her cousin and paternal aunt; Diabetes in her maternal uncle; Heart attack in her brother, paternal aunt, paternal  grandfather, and paternal uncle; Lung cancer in her paternal aunt; Other in her daughter; Stomach cancer (age of onset: 36) in her maternal grandmother.   ROS:  Please see the history of present illness.   Otherwise, review of systems is positive for none.   All other systems are reviewed and negative.   PHYSICAL EXAM: VS:  There were no vitals taken for this visit. , BMI There is no height or weight on file to calculate BMI. GEN: Well nourished, well developed, in no acute distress  HEENT: normal  Neck: no JVD, carotid bruits, or masses Cardiac: ***RRR; no murmurs, rubs, or gallops,no edema  Respiratory:  clear to auscultation bilaterally, normal work of breathing GI: soft, nontender, nondistended, + BS MS: no deformity or atrophy  Skin: warm and dry Neuro:  Strength and sensation are intact Psych: euthymic mood, full affect  EKG:  EKG {ACTION; IS/IS [id number]} ordered today. Personal review of the ekg ordered *** shows ***   Recent Labs: 01/23/2022: Brain Natriuretic Peptide 49; BUN 28; Creat 1.08; Hemoglobin 10.3; Platelets 367; Potassium 4.6; Sodium 137    Lipid Panel     Component Value Date/Time   CHOL 97 06/26/2021 1218   TRIG 112 06/26/2021 1218   HDL 41 06/26/2021 1218   CHOLHDL 2.4 06/26/2021 1218   VLDL 22 06/26/2021 1218   LDLCALC 34 06/26/2021 1218   LDLCALC 42 05/23/2020 0924     Wt Readings from Last 3 Encounters:  03/06/22 179 lb 3.2 oz (81.3 kg)  02/26/22 180 lb (81.6 kg)  02/16/22 178 lb (80.7 kg)      Other studies Reviewed: Additional studies/ records that were reviewed today include: TTE 02/11/2022 Review of the above records today demonstrates:   1. Left ventricular ejection fraction, by estimation, is 55 to 60%. The  left ventricle has normal function. Abnormal (paradoxical) septal motion,  consistent with left bundle branch block. Left ventricular diastolic  parameters are indeterminate.      There is mild left ventricular hypertrophy. Left  ventricular  diastolic  parameters are indeterminate.   2. Right ventricular systolic function is normal. The right ventricular  size is normal. There is normal pulmonary artery systolic pressure. The  estimated right ventricular systolic pressure is 99.8 mmHg.   3. The mitral valve is normal in structure. No evidence of mitral valve  regurgitation. No evidence of mitral stenosis.   4. The aortic valve is tricuspid. Aortic valve regurgitation is not  visualized. No aortic stenosis is present.   5. The inferior vena cava is normal in size with greater than 50%  respiratory variability, suggesting right atrial pressure of 3 mmHg.    ASSESSMENT AND PLAN:  1.  Paroxysmal atrial fibrillation: Currently on Eliquis 5 mg twice daily.  CHA2DS2-VASc of at least 3.  Status post atrial fibrillation ablation 01/06/2022.  Wore a cardiac monitor post ablation that showed no further evidence of atrial fibrillation.  2.  Nonischemic cardiomyopathy: Currently on carvedilol, Entresto, Aldactone.  Repeat echo shows normalization of her ejection fraction.  Continue with plan per primary cardiology.  3.  Hyperlipidemia: Continue atorvastatin 40 mg daily  4.  Suspected obstructive sleep apnea:***   Current medicines are reviewed at length with the patient today.   The patient does not have concerns regarding her medicines.  The following changes were made today:  ***  Labs/ tests ordered today include:  No orders of the defined types were placed in this encounter.    Disposition:   FU with Willma Obando *** months  Signed, Eusebia Grulke Meredith Leeds, MD  04/12/2022 4:14 PM     [address] [place] [address] [phone] (office) [phone] (fax)

## 2022-04-13 ENCOUNTER — Encounter: Payer: Self-pay | Admitting: Cardiology

## 2022-04-13 ENCOUNTER — Ambulatory Visit (INDEPENDENT_AMBULATORY_CARE_PROVIDER_SITE_OTHER): Payer: Medicare HMO | Admitting: Cardiology

## 2022-04-13 VITALS — BP 104/60 | HR 81 | Ht 62.0 in | Wt 180.0 lb

## 2022-04-13 DIAGNOSIS — I480 Paroxysmal atrial fibrillation: Secondary | ICD-10-CM | POA: Diagnosis not present

## 2022-04-13 DIAGNOSIS — D6869 Other thrombophilia: Secondary | ICD-10-CM

## 2022-04-13 NOTE — Patient Instructions (Signed)
Medication Instructions:  Your physician recommends that you continue on your current medications as directed. Please refer to the Current Medication list given to you today.  *If you need a refill on your cardiac medications before your next appointment, please call your pharmacy*   Lab Work: None ordered  Testing/Procedures: None ordered   Follow-Up: At [facility], you and your health needs are our priority.  As part of our continuing mission to provide you with exceptional heart care, we have created designated Provider Care Teams.  These Care Teams include your primary Cardiologist (physician) and Advanced Practice Providers (APPs -  Physician Assistants and Nurse Practitioners) who all work together to provide you with the care you need, when you need it.  Your next appointment:   6 month(s)  The format for your next appointment:   In Person  Provider:   You will follow up in the Atrial Fibrillation Clinic located at [facility]. Your provider will be: Donna Carroll, NP or Clint R. Fenton, PA-C    Thank you for choosing [facility]!!   Hailly Fess, RN [phone]  Other Instructions    Important Information About Sugar           

## 2022-05-15 ENCOUNTER — Other Ambulatory Visit: Payer: Self-pay | Admitting: Family Medicine

## 2022-05-15 NOTE — Telephone Encounter (Signed)
Requested Prescriptions  Pending Prescriptions Disp Refills  . colesevelam (WELCHOL) 625 MG tablet [Pharmacy Med Name: COLESEVELAM 625 MG TABLET] 540 tablet 0    Sig: TAKE 3 TABLETS BY MOUTH TWICE A DAY WITH A MEAL     Cardiovascular:  Antilipid - Bile Acid Sequestrants Failed - 05/15/2022  8:16 AM      Failed - Lipid Panel in normal range within the last 12 months    Cholesterol  Date Value Ref Range Status  06/26/2021 97 0 - 200 mg/dL Final   LDL Cholesterol (Calc)  Date Value Ref Range Status  05/23/2020 42 mg/dL (calc) Final    Comment:    Reference range: <100 . Desirable range <100 mg/dL for primary prevention;   <70 mg/dL for patients with CHD or diabetic patients  with > or = 2 CHD risk factors. Marland Kitchen LDL-C is now calculated using the Martin-Hopkins  calculation, which is a validated novel method providing  better accuracy than the Friedewald equation in the  estimation of LDL-C.  Cresenciano Genre et al. Annamaria Helling. 0947;096(28): 2061-2068  (http://education.[facility].com/faq/FAQ164)    LDL Cholesterol  Date Value Ref Range Status  06/26/2021 34 0 - 99 mg/dL Final    Comment:           Total Cholesterol/HDL:CHD Risk Coronary Heart Disease Risk Table                     Men   Women  1/2 Average Risk   3.4   3.3  Average Risk       5.0   4.4  2 X Average Risk   9.6   7.1  3 X Average Risk  23.4   11.0        Use the calculated Patient Ratio above and the CHD Risk Table to determine the patient's CHD Risk.        ATP III CLASSIFICATION (LDL):  <100     mg/dL   Optimal  100-129  mg/dL   Near or Above                    Optimal  130-159  mg/dL   Borderline  160-189  mg/dL   High  >190     mg/dL   Very High Performed at [facility] [address], [place], [place] [address]    HDL  Date Value Ref Range Status  06/26/2021 41 >40 mg/dL Final   Triglycerides  Date Value Ref Range Status  06/26/2021 112 <150 mg/dL Final         Passed - Valid encounter  within last 12 months    Recent Outpatient Visits          2 months ago Right leg pain   [place] [facility] Schaumann, Cammie Mcgee, MD   2 months ago Right sided sciatica   [place] [facility]enn[facility] Schaumann, Cammie Mcgee, MD   3 months ago Shortness of breath   [place] Susy Frizzle, MD   10 months ago Controlled type 2 diabetes mellitus with complication, without long-term current use of insulin ([place])   [place] Susy Frizzle, MD   1 year ago Controlled type 2 diabetes mellitus with complication, without long-term current use of insulin ([place])   [facility], Cammie Mcgee, MD

## 2022-06-10 ENCOUNTER — Other Ambulatory Visit (HOSPITAL_COMMUNITY): Payer: Self-pay

## 2022-06-10 ENCOUNTER — Telehealth (HOSPITAL_COMMUNITY): Payer: Self-pay | Admitting: Pharmacist

## 2022-06-10 NOTE — Telephone Encounter (Signed)
Received call from patient. She recently got a refill on her carvedilol and noted a manufacturer change. Since the change, she has "felt miserable". She had diarrhea, flatulence and a headache. She went back to her old prescription and symptoms resolved. Recently ran out of the old bottle and now symptoms have begun again. It appears that patient used to get carvedilol manufactured through [employer]. [employer] has been bought out by [employer]. The [employer] NDC is [phone].   Called [facility] and they are not able to get the old [place] number anymore. The are going to try and order a different manufacturer, [facility] [phone]). Patient is amenable to switching to a different manufacturer. If [facility] is unable to obtain the new manufacturer, plan to order through [facility] as they can order a different NDC for the patient. Patient will let me know how to proceed after she speaks with [facility] tomorrow.   Audry Riles, PharmD, BCPS, BCCP, CPP Heart Failure Clinic Pharmacist [phone]

## 2022-06-11 ENCOUNTER — Other Ambulatory Visit (HOSPITAL_COMMUNITY): Payer: Self-pay

## 2022-06-12 ENCOUNTER — Other Ambulatory Visit (HOSPITAL_COMMUNITY): Payer: Self-pay | Admitting: *Deleted

## 2022-06-12 ENCOUNTER — Other Ambulatory Visit (HOSPITAL_COMMUNITY): Payer: Self-pay

## 2022-06-12 MED ORDER — CARVEDILOL 12.5 MG PO TABS
ORAL_TABLET | ORAL | 3 refills | Status: DC
  Filled 2022-06-12: qty 75, 30d supply, fill #0

## 2022-06-12 MED ORDER — CARVEDILOL 12.5 MG PO TABS: ORAL_TABLET | ORAL | 3 refills | Status: DC

## 2022-06-15 ENCOUNTER — Other Ambulatory Visit (HOSPITAL_COMMUNITY): Payer: Self-pay

## 2022-06-30 ENCOUNTER — Telehealth (HOSPITAL_COMMUNITY): Payer: Self-pay | Admitting: Pharmacist

## 2022-06-30 NOTE — Telephone Encounter (Signed)
Received VM from patient. Patient is still feeling miserable, experiencing vomiting and diarrhea even after changing manufacturers of the carvedilol again. I suspect the specific manufacturer of the medication is not causing her problems. She may benefit from changing carvedilol to either metoprolol or bisoprolol. If that does not work, will need further workup into the cause of her symptoms. She has not been seen in the AHF Clinic since January. Will route to triage so that she can be seen by a provider.   Audry Riles, PharmD, BCPS, BCCP, CPP Heart Failure Clinic Pharmacist [phone]

## 2022-06-30 NOTE — Telephone Encounter (Signed)
Transition her to bisoprolol 2.5 mg daily and have her followup with APP or me.

## 2022-07-01 ENCOUNTER — Encounter (HOSPITAL_COMMUNITY): Payer: Self-pay | Admitting: *Deleted

## 2022-07-01 ENCOUNTER — Other Ambulatory Visit (HOSPITAL_COMMUNITY): Payer: Self-pay | Admitting: *Deleted

## 2022-07-01 ENCOUNTER — Other Ambulatory Visit (HOSPITAL_COMMUNITY): Payer: Self-pay

## 2022-07-01 MED ORDER — BISOPROLOL FUMARATE 5 MG PO TABS
2.5000 mg | ORAL_TABLET | Freq: Every day | ORAL | 3 refills | Status: DC
Start: 2022-07-01 — End: 2023-09-20
  Filled 2022-07-01: qty 45, 90d supply, fill #0
  Filled 2022-09-23: qty 45, 90d supply, fill #1
  Filled 2022-12-25: qty 45, 90d supply, fill #2
  Filled 2023-03-23: qty 45, 90d supply, fill #3

## 2022-07-01 NOTE — Telephone Encounter (Signed)
Called pt no answer. Message sent via mychart. Bisoprolol sent to pharmacy. Appointment scheduled.

## 2022-07-03 ENCOUNTER — Ambulatory Visit (INDEPENDENT_AMBULATORY_CARE_PROVIDER_SITE_OTHER): Payer: Medicare HMO | Admitting: Family Medicine

## 2022-07-03 ENCOUNTER — Other Ambulatory Visit: Payer: Self-pay

## 2022-07-03 ENCOUNTER — Other Ambulatory Visit (HOSPITAL_COMMUNITY): Payer: Self-pay

## 2022-07-03 VITALS — BP 120/64 | HR 82 | Temp 98.5°F

## 2022-07-03 DIAGNOSIS — E119 Type 2 diabetes mellitus without complications: Secondary | ICD-10-CM

## 2022-07-03 DIAGNOSIS — R110 Nausea: Secondary | ICD-10-CM

## 2022-07-03 MED ORDER — FAMOTIDINE 40 MG PO TABS: 40.0000 mg | ORAL_TABLET | Freq: Every day | ORAL | 0 refills | Status: DC

## 2022-07-03 NOTE — Addendum Note (Signed)
Addended by: Randal Buba K on: 07/03/2022 12:56 PM   Modules accepted: Orders

## 2022-07-03 NOTE — Progress Notes (Signed)
Subjective:    Patient ID: [name], female    DOB: 1946-03-04, 76 y.o.   MRN: [id number]  HPI  Patient has 2 concerns.  First she believes that her medication is making her sick.  She was previously on spironolactone 25 mg daily.  The previous manufacturer has been "fall out" by another Designer, fashion/clothing.  Since that time, the color and size of the pill has changed.  It is the correct pill.  However she is concerned that the fillers in the pills are making her sick.  She states that when she first started that pill she would develop nausea.  At night she would get sick and experienced vomiting.  She states that it does not happen every night maybe once or twice a week.  She then switched back to the old pills and the nausea went away.  After all of her old pills were exhausted she got a different manufacturer's version of spironolactone 25 mg.  Since that time she has had 1-2 episodes of nausea at night when she lies down which she again attributes to the different manufacturer of the spironolactone.  She denies any fevers or chills.  She denies any melena or hematochezia.  She denies any abdominal pain or acute abdomen.  She denies any constipation or blockage.  Her physical exam today is benign.  Second concern is pain in her right flank.  She states that over the last several months she has had a pain that radiates up and down her right flank from her right scapula down along her right ribs into her latissimus dorsi.  It tends to be worse with twisting and turning.  She denies any numbness or tingling in her legs.  She denies any saddle anesthesia.  She denies any bowel or bladder incontinence.  She denies any hemoptysis.  She has had several chest x-rays that are normal.  Her biggest concern is due to her history of breast cancer.  To me this sounds more of a neuropathic pain perhaps due to thoracic radiculopathy or musculoskeletal pain. Past Medical History:  Diagnosis Date   Allergy 2000   Allergy  history unknown    Arthritis    oa   Asthma    takes acolate for   Breast cancer ([place]) 1999; 2008   hx of bilateral breast cancer   Breast cancer, left breast ([place]) 10/27/2012   3.2 cm ER positive, Her-2 negative, 1 node positive S/P mastectomy/TTRAM reconstruction 4/08   Breast cancer, right breast ([place]) 10/27/2012   3.2 cm ER/PR positive, 1 node positive, Her-2 negative Rx w mastectomy, AC -T chemo, followed by aromatase inhibitor x 5 years dx 4/08   Cancer ([place])    breast CA   Cataracts, bilateral    CHF (congestive heart failure) (HCC)    Dehydration after exertion    after she went berry picking in the summer heat . see ED visit    Diabetes ([place])    DM2 (diabetes mellitus, type 2) ([place])    on welchol for   Dyspnea    with exertion   Elevated cholesterol    GERD (gastroesophageal reflux disease)    Hiatal hernia    Hypertension    LBBB (left bundle branch block) 10/27/2012   Leg cramps    NICM (nonischemic cardiomyopathy) ([place])    a. Echo:  06/14/12: Poor endocardial definition, possible septal HK, EF 50-55%, normal wall motion, mild LAE ;  b.  [facility] 9/13:  normal cors, EF  35-40%,  c. Echo 7/14: EF 50-55%, mild LAE   Other fatigue 01/01/2015   PONV (postoperative nausea and vomiting)    Pulmonary embolus ([place]) 2001   after tram flap surgery   Past Surgical History:  Procedure Laterality Date   ATRIAL FIBRILLATION ABLATION N/A 01/06/2022   Procedure: ATRIAL FIBRILLATION ABLATION;  Surgeon: Constance Haw, MD;  Location: [facility];  Service: Cardiovascular;  Laterality: N/A;   BREAST SURGERY  2000 &2005   DILATION AND CURETTAGE OF UTERUS   60yr ago   x 2   EYE SURGERY  2020   JOINT REPLACEMENT     KNEE CLOSED REDUCTION Right 06/01/2018   Procedure: CLOSED MANIPULATION RIGHT KNEE;  Surgeon: AGaynelle Arabian MD;  Location: [facility];  Service: Orthopedics;  Laterality: Right;   KNEE CLOSED REDUCTION Left 02/22/2019   Procedure: CLOSED MANIPULATION KNEE;  Surgeon:  AGaynelle Arabian MD;  Location: [facility];  Service: Orthopedics;  Laterality: Left;  175m   KNEE SURGERY  2006   left torn cartilage repair   LEFT HEART CATHETERIZATION WITH CORONARY ANGIOGRAM N/A 06/15/2012   Procedure: LEFT HEART CATHETERIZATION WITH CORONARY ANGIOGRAM;  Surgeon: MuWellington HampshireMD;  [facility];  Service: Cardiovascular;  Laterality: N/A;   MASTECTOMY     bilateral   RECONSTRUCTION BREAST W/ TRAM FLAP Left 2001   TOTAL KNEE ARTHROPLASTY Right 03/07/2018   Procedure: RIGHT TOTAL KNEE ARTHROPLASTY;  Surgeon: AlGaynelle ArabianMD;  Location: [facility];  Service: Orthopedics;  Laterality: Right;   TOTAL KNEE ARTHROPLASTY Left 10/31/2018   Procedure: TOTAL KNEE ARTHROPLASTY;  Surgeon: AlGaynelle ArabianMD;  Location: [facility];  Service: Orthopedics;  Laterality: Left;  5057m  Current Outpatient Medications on File Prior to Visit  Medication Sig Dispense Refill   ACCU-CHEK AVIVA PLUS test strip USE TO CHECK BLOOD SUGAR EVERY MORNING DX: E11.9 100 strip 3   acetaminophen (TYLENOL) 500 MG tablet Take 1,000 mg by mouth every 6 (six) hours as needed for mild pain or moderate pain.     atorvastatin (LIPITOR) 40 MG tablet Take 1 tablet (40 mg total) by mouth daily. 90 tablet 3   bisoprolol (ZEBETA) 5 MG tablet Take 0.5 tablets (2.5 mg total) by mouth daily. 45 tablet 3   Blood Glucose Monitoring Suppl (ACCU-CHEK AVIVA PLUS) w/Device KIT Use to check FBS QAM - DX:E11.9 1 kit 1   Cholecalciferol (VITAMIN D3) 50 MCG (2000 UT) CAPS Take 2,000 Units by mouth daily.     clobetasol ointment (TEMOVATE) 0.00.091 application. daily as needed (Rash).     Coenzyme Q10 (CO Q 10 PO) Take 400 mg by mouth daily.     colesevelam (WELCHOL) 625 MG tablet TAKE 3 TABLETS BY MOUTH TWICE A DAY WITH A MEAL 540 tablet 0   dexlansoprazole (DEXILANT) 60 MG capsule TAKE 1 CAPSULE BY MOUTH EVERY DAY 90 capsule 1   diltiazem (CARDIZEM) 30 MG tablet TAKE 1 TABLET EVERY 4 HOURS AS NEEDED FOR AFIB HEART RATE >100  45 tablet 1   ELIQUIS 5 MG TABS tablet TAKE 1 TABLET BY MOUTH TWICE A DAY 180 tablet 3   fluticasone (FLONASE) 50 MCG/ACT nasal spray Place 2 sprays into both nostrils daily as needed for allergies or rhinitis.     Ginkgo Biloba 120 MG TABS Take 120 mg by mouth daily.     Glucosamine-Chondroitin (COSAMIN DS PO) Take 1 tablet by mouth 2 (two) times a day.     Lancets (ACCU-CHEK  SOFT TOUCH) lancets Use to check BS QAM DX: E11.9 100 each 3   Magnesium 250 MG TABS Take 250 mg by mouth daily.     Multiple Vitamin (MULTIVITAMIN WITH MINERALS) TABS tablet Take 1 tablet by mouth daily. Senior Multivitamin Iron Free Formula     Omega-3 Fatty Acids (OMEGA 3 PO) Take 500 mg by mouth 2 (two) times daily.     Potassium 99 MG TABS Take 1 tablet by mouth every morning.     spironolactone (ALDACTONE) 25 MG tablet Take 1 tablet (25 mg total) by mouth daily. 90 tablet 3   vitamin B-12 (CYANOCOBALAMIN) 1000 MCG tablet Take 1,000 mcg by mouth 2 (two) times daily.     vitamin C (ASCORBIC ACID) 500 MG tablet Take 500 mg by mouth daily. Ultra C     zafirlukast (ACCOLATE) 20 MG tablet Take 1 tablet (20 mg total) by mouth 2 (two) times daily. 180 tablet 3   losartan (COZAAR) 100 MG tablet Take 1 tablet (100 mg total) by mouth daily. 90 tablet 3   No current facility-administered medications on file prior to visit.   Allergies  Allergen Reactions   Adhesive [Tape] Other (See Comments)    Skin turns red    Diphenhydramine Other (See Comments)    Pt feels wired, hyperactive   Vicodin [Hydrocodone-Acetaminophen] Nausea And Vomiting   Social History   Socioeconomic History   Marital status: Married    Spouse name: Dwight   Number of children: 2   Years of education: Not on file   Highest education level: Not on file  Occupational History   Occupation: retired    Fish farm manager: RETIRED    Comment: post Furniture conservator/restorer  Tobacco Use   Smoking status: Never   Smokeless tobacco: Never   Tobacco comments:     Never smoke 01/27/22  Vaping Use   Vaping Use: Never used  Substance and Sexual Activity   Alcohol use: Never   Drug use: Never   Sexual activity: Not Currently    Birth control/protection: None  Other Topics Concern   Not on file  Social History Narrative   1 son and 1 daughter.   Social Determinants of Health   Financial Resource Strain: Low Risk  (07/04/2021)   Overall Financial Resource Strain (CARDIA)    Difficulty of Paying Living Expenses: Not hard at all  Food Insecurity: No Food Insecurity (07/04/2021)   Hunger Vital Sign    Worried About Running Out of Food in the Last Year: Never true    Ran Out of Food in the Last Year: Never true  Transportation Needs: No Transportation Needs (07/04/2021)   PRAPARE - Hydrologist (Medical): No    Lack of Transportation (Non-Medical): No  Physical Activity: Sufficiently Active (07/04/2021)   Exercise Vital Sign    Days of Exercise per Week: 5 days    Minutes of Exercise per Session: 30 min  Stress: No Stress Concern Present (07/04/2021)   [place]    Feeling of Stress : Not at all  Social Connections: Moderately Integrated (07/04/2021)   Social Connection and Isolation Panel [NHANES]    Frequency of Communication with Friends and Family: More than three times a week    Frequency of Social Gatherings with Friends and Family: More than three times a week    Attends Religious Services: Never    Marine scientist or Organizations: No  Attends Archivist Meetings: 1 to 4 times per year    Marital Status: Married  Human resources officer Violence: Not At Risk (07/04/2021)   Humiliation, Afraid, Rape, and Kick questionnaire    Fear of Current or Ex-Partner: No    Emotionally Abused: No    Physically Abused: No    Sexually Abused: No      Review of Systems  All other systems reviewed and are negative.      Objective:    Physical Exam Vitals reviewed.  Constitutional:      General: She is not in acute distress.    Appearance: She is well-developed. She is not diaphoretic.  Cardiovascular:     Rate and Rhythm: Normal rate and regular rhythm.     Heart sounds: Murmur heard.     No friction rub. No gallop.  Pulmonary:     Effort: Pulmonary effort is normal. No respiratory distress.     Breath sounds: Normal breath sounds. No stridor. No wheezing or rales.  Abdominal:     General: Bowel sounds are normal.     Palpations: Abdomen is soft.  Musculoskeletal:     Cervical back: Neck supple.     Right lower leg: No edema.     Left lower leg: No edema.  Skin:    Coloration: Skin is not jaundiced.  Neurological:     General: No focal deficit present.     Mental Status: She is oriented to person, place, and time. Mental status is at baseline.           Assessment & Plan:  Nausea - Plan: famotidine (PEPCID) 40 MG tablet, CBC with Differential/Platelet, COMPLETE METABOLIC PANEL WITH GFR I am not certain what is causing the nausea.  The patient does have a history of recalcitrant GERD requiring dexilant.  The nausea seems to be at night after the patient is supine.  She is already elevated the head of her bed.  I do not believe that the fillers from 2 different medications would cause a similar reaction.  I believe more than likely she is experiencing dyspepsia.  Therefore I want to try Pepcid 40 mg daily for 2 weeks to see if this will help.  The second issue I believe is musculoskeletal pain either due to degenerative disc disease in the thoracic spine and perhaps thoracic radiculopathy versus muscular pain.  Given the location of the pain, I would recommend a thoracic MRI to evaluate for thoracic radiculopathy and I would start by obtaining an x-ray of the thoracic spine to evaluate for any degenerative disc disease.  The patient defers this at the present time.  Her biggest concern was the possibility of breast  cancer.

## 2022-07-04 LAB — COMPLETE METABOLIC PANEL WITH GFR
AG Ratio: 1.6 (calc) (ref 1.0–2.5)
ALT: 13 U/L (ref 6–29)
AST: 12 U/L (ref 10–35)
Albumin: 4.1 g/dL (ref 3.6–5.1)
Alkaline phosphatase (APISO): 85 U/L (ref 37–153)
BUN/Creatinine Ratio: 22 (calc) (ref 6–22)
BUN: 23 mg/dL (ref 7–25)
CO2: 24 mmol/L (ref 20–32)
Calcium: 9.2 mg/dL (ref 8.6–10.4)
Chloride: 109 mmol/L (ref 98–110)
Creat: 1.05 mg/dL — ABNORMAL HIGH (ref 0.60–1.00)
Globulin: 2.5 g/dL (calc) (ref 1.9–3.7)
Glucose, Bld: 95 mg/dL (ref 65–99)
Potassium: 4.4 mmol/L (ref 3.5–5.3)
Sodium: 140 mmol/L (ref 135–146)
Total Bilirubin: 0.2 mg/dL (ref 0.2–1.2)
Total Protein: 6.6 g/dL (ref 6.1–8.1)
eGFR: 54 mL/min/{1.73_m2} — ABNORMAL LOW (ref >=60)

## 2022-07-04 LAB — CBC WITH DIFFERENTIAL/PLATELET
Absolute Monocytes: 619 cells/uL (ref 200–950)
Basophils Absolute: 41 cells/uL (ref 0–200)
Basophils Relative: 0.6 %
Eosinophils Absolute: 190 cells/uL (ref 15–500)
Eosinophils Relative: 2.8 %
HCT: 31.1 % — ABNORMAL LOW (ref 35.0–45.0)
Hemoglobin: 10.2 g/dL — ABNORMAL LOW (ref 11.7–15.5)
Lymphs Abs: 2054 cells/uL (ref 850–3900)
MCH: 31.7 pg (ref 27.0–33.0)
MCHC: 32.8 g/dL (ref 32.0–36.0)
MCV: 96.6 fL (ref 80.0–100.0)
MPV: 9.7 fL (ref 7.5–12.5)
Monocytes Relative: 9.1 %
Neutro Abs: 3896 cells/uL (ref 1500–7800)
Neutrophils Relative %: 57.3 %
Platelets: 279 10*3/uL (ref 140–400)
RBC: 3.22 10*6/uL — ABNORMAL LOW (ref 3.80–5.10)
RDW: 12.6 % (ref 11.0–15.0)
Total Lymphocyte: 30.2 %
WBC: 6.8 10*3/uL (ref 3.8–10.8)

## 2022-07-04 LAB — HEMOGLOBIN A1C
Hgb A1c MFr Bld: 6.6 % of total Hgb — ABNORMAL HIGH (ref <5.7)
Mean Plasma Glucose: 143 mg/dL
eAG (mmol/L): 7.9 mmol/L

## 2022-07-10 ENCOUNTER — Ambulatory Visit (INDEPENDENT_AMBULATORY_CARE_PROVIDER_SITE_OTHER): Payer: Medicare HMO

## 2022-07-10 DIAGNOSIS — Z0000 Encounter for general adult medical examination without abnormal findings: Secondary | ICD-10-CM

## 2022-07-10 NOTE — Progress Notes (Signed)
Subjective:   [name] is a 76 y.o. female who presents for Medicare Annual (Subsequent) preventive examination.  Review of Systems     Cardiac Risk Factors include: diabetes mellitus;dyslipidemia;hypertension;Other (see comment), Risk factor comments: A-fib     Objective:    There were no vitals filed for this visit. There is no height or weight on file to calculate BMI.     07/10/2022    2:21 PM 01/06/2022   11:54 AM 07/04/2021    2:04 PM 12/21/2019    1:03 PM 02/20/2019    4:37 PM 11/21/2018   10:07 AM 10/31/2018    3:20 PM  Advanced Directives  Does Patient Have a Medical Advance Directive?  Yes Yes No No Yes Yes  Type of Paramedic of [place];Living will [place];Living will [place];Living will   Healthcare Power of [place]  Does patient want to make changes to medical advance directive?       No - Patient declined  Copy of [place] in Chart?   No - copy requested   No - copy requested   Would patient like information on creating a medical advance directive?     No - Patient declined      Current Medications (verified) Outpatient Encounter Medications as of 07/10/2022  Medication Sig   ACCU-CHEK AVIVA PLUS test strip USE TO CHECK BLOOD SUGAR EVERY MORNING DX: E11.9   acetaminophen (TYLENOL) 500 MG tablet Take 1,000 mg by mouth every 6 (six) hours as needed for mild pain or moderate pain.   atorvastatin (LIPITOR) 40 MG tablet Take 1 tablet (40 mg total) by mouth daily.   bisoprolol (ZEBETA) 5 MG tablet Take 0.5 tablets (2.5 mg total) by mouth daily.   Blood Glucose Monitoring Suppl (ACCU-CHEK AVIVA PLUS) w/Device KIT Use to check FBS QAM - DX:E11.9   Cholecalciferol (VITAMIN D3) 50 MCG (2000 UT) CAPS Take 2,000 Units by mouth daily.   clobetasol ointment (TEMOVATE) 8.08 % 1 application. daily as needed (Rash).   Coenzyme Q10 (CO Q 10 PO) Take 400 mg by mouth  daily.   colesevelam (WELCHOL) 625 MG tablet TAKE 3 TABLETS BY MOUTH TWICE A DAY WITH A MEAL   dexlansoprazole (DEXILANT) 60 MG capsule TAKE 1 CAPSULE BY MOUTH EVERY DAY   diltiazem (CARDIZEM) 30 MG tablet TAKE 1 TABLET EVERY 4 HOURS AS NEEDED FOR AFIB HEART RATE >100   ELIQUIS 5 MG TABS tablet TAKE 1 TABLET BY MOUTH TWICE A DAY   famotidine (PEPCID) 40 MG tablet Take 1 tablet (40 mg total) by mouth daily.   fluticasone (FLONASE) 50 MCG/ACT nasal spray Place 2 sprays into both nostrils daily as needed for allergies or rhinitis.   Ginkgo Biloba 120 MG TABS Take 120 mg by mouth daily.   Glucosamine-Chondroitin (COSAMIN DS PO) Take 1 tablet by mouth 2 (two) times a day.   Lancets (ACCU-CHEK SOFT TOUCH) lancets Use to check BS QAM DX: E11.9   Magnesium 250 MG TABS Take 250 mg by mouth daily.   Multiple Vitamin (MULTIVITAMIN WITH MINERALS) TABS tablet Take 1 tablet by mouth daily. Senior Multivitamin Iron Free Formula   Omega-3 Fatty Acids (OMEGA 3 PO) Take 500 mg by mouth 2 (two) times daily.   Potassium 99 MG TABS Take 1 tablet by mouth every morning.   spironolactone (ALDACTONE) 25 MG tablet Take 1 tablet (25 mg total) by mouth daily.   vitamin  B-12 (CYANOCOBALAMIN) 1000 MCG tablet Take 1,000 mcg by mouth 2 (two) times daily.   vitamin C (ASCORBIC ACID) 500 MG tablet Take 500 mg by mouth daily. Ultra C   zafirlukast (ACCOLATE) 20 MG tablet Take 1 tablet (20 mg total) by mouth 2 (two) times daily.   losartan (COZAAR) 100 MG tablet Take 1 tablet (100 mg total) by mouth daily.   No facility-administered encounter medications on file as of 07/10/2022.    Allergies (verified) Adhesive [tape], Diphenhydramine, and Vicodin [hydrocodone-acetaminophen]   History: Past Medical History:  Diagnosis Date   Allergy 2000   Allergy history unknown    Arthritis    oa   Asthma    takes acolate for   Breast cancer ([place]) 1999; 2008   hx of bilateral breast cancer   Breast cancer, left breast ([place])  10/27/2012   3.2 cm ER positive, Her-2 negative, 1 node positive S/P mastectomy/TTRAM reconstruction 4/08   Breast cancer, right breast ([place]) 10/27/2012   3.2 cm ER/PR positive, 1 node positive, Her-2 negative Rx w mastectomy, AC -T chemo, followed by aromatase inhibitor x 5 years dx 4/08   Cancer ([place])    breast CA   Cataracts, bilateral    CHF (congestive heart failure) (HCC)    Dehydration after exertion    after she went berry picking in the summer heat . see ED visit    Diabetes ([place])    DM2 (diabetes mellitus, type 2) ([place])    on welchol for   Dyspnea    with exertion   Elevated cholesterol    GERD (gastroesophageal reflux disease)    Hiatal hernia    Hypertension    LBBB (left bundle branch block) 10/27/2012   Leg cramps    NICM (nonischemic cardiomyopathy) ([place])    a. Echo:  06/14/12: Poor endocardial definition, possible septal HK, EF 50-55%, normal wall motion, mild LAE ;  b.  [facility] 9/13:  normal cors, EF 35-40%,  c. Echo 7/14: EF 50-55%, mild LAE   Other fatigue 01/01/2015   PONV (postoperative nausea and vomiting)    Pulmonary embolus ([place]) 2001   after tram flap surgery   Past Surgical History:  Procedure Laterality Date   ATRIAL FIBRILLATION ABLATION N/A 01/06/2022   Procedure: ATRIAL FIBRILLATION ABLATION;  Surgeon: Constance Haw, MD;  Location: [place] [facility];  Service: Cardiovascular;  Laterality: N/A;   BREAST SURGERY  2000 &2005   DILATION AND CURETTAGE OF UTERUS   48yr ago   x 2   EYE SURGERY  2020   JOINT REPLACEMENT     KNEE CLOSED REDUCTION Right 06/01/2018   Procedure: CLOSED MANIPULATION RIGHT KNEE;  Surgeon: AGaynelle Arabian MD;  Location: [facility];  Service: Orthopedics;  Laterality: Right;   KNEE CLOSED REDUCTION Left 02/22/2019   Procedure: CLOSED MANIPULATION KNEE;  Surgeon: AGaynelle Arabian MD;  Location: [facility];  Service: Orthopedics;  Laterality: Left;  165m   KNEE SURGERY  2006   left torn cartilage repair   LEFT HEART CATHETERIZATION  WITH CORONARY ANGIOGRAM N/A 06/15/2012   Procedure: LEFT HEART CATHETERIZATION WITH CORONARY ANGIOGRAM;  Surgeon: MuWellington HampshireMD;  [facility];  Service: Cardiovascular;  Laterality: N/A;   MASTECTOMY     bilateral   RECONSTRUCTION BREAST W/ TRAM FLAP Left 2001   TOTAL KNEE ARTHROPLASTY Right 03/07/2018   Procedure: RIGHT TOTAL KNEE ARTHROPLASTY;  Surgeon: AlGaynelle ArabianMD;  [facility];  Service: Orthopedics;  Laterality: Right;  TOTAL KNEE ARTHROPLASTY Left 10/31/2018   Procedure: TOTAL KNEE ARTHROPLASTY;  Surgeon: Gaynelle Arabian, MD;  Location: [facility];  Service: Orthopedics;  Laterality: Left;  7mn   Family History  Problem Relation Age of Onset   Asthma Maternal Grandmother    Breast cancer Maternal Grandmother        dx. 719s  Stomach cancer Maternal Grandmother 60       lots of stomach problems   Heart attack Paternal Grandfather    Other Daughter        cyst on ovary; unilateral oophorectomy   Heart attack Brother    Cancer Maternal Aunt        unknown type   Celiac disease Maternal Aunt    Diabetes Maternal Uncle    Colon cancer Cousin        dx. 50s-60s   Colon cancer Paternal Aunt        dx. late 677s  Lung cancer Paternal Aunt        dx. 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   Social Determinants of Health   Financial Resource Strain: Low Risk  (07/10/2022)   Overall Financial Resource Strain (CARDIA)    Difficulty of Paying Living Expenses: Not hard at all  Food Insecurity: No Food Insecurity (07/10/2022)   Hunger Vital Sign    Worried About Running Out of Food in the Last Year: Never true    Ran Out of Food in the Last Year: Never true  Transportation Needs: No Transportation Needs (07/10/2022)   PRAPARE - THydrologist(Medical): No    Lack of Transportation (Non-Medical): No  Physical Activity: Sufficiently Active (07/10/2022)   Exercise Vital Sign    Days of Exercise per Week: 5 days    Minutes of Exercise per Session: 40 min  Stress: No Stress Concern Present (07/10/2022)   [place]   Feeling of Stress : Not at all  Social Connections: Moderately Isolated (07/10/2022)   Social Connection and Isolation Panel [NHANES]    Frequency of Communication with Friends and Family: Twice a week    Frequency of Social Gatherings with Friends and Family: Three times a week    Attends Religious Services: Never    Active Member of Clubs or Organizations: No    Attends CArchivistMeetings: Never    Marital Status: Married    Tobacco Counseling Counseling given: Not Answered Tobacco comments: Never smoke 01/27/22   Clinical Intake:  Pre-visit preparation completed: Yes  Pain : No/denies  pain     BMI - recorded: 32.91 Nutritional Status: BMI > 30  Obese Nutritional Risks: Other (Comment) Diabetes: Yes CBG done?: No Did pt. bring in CBG monitor from home?: No  How often do you need to have someone help you when you read instructions, pamphlets, or other written materials from your doctor or pharmacy?: 1 - Never What is the last grade level you completed in school?:  12  Diabetic?yes Nutrition Risk Assessment:  Has the patient had any N/V/D within the last 2 months?  No  Does the patient have any non-healing wounds?  No  Has the patient had any unintentional weight loss or weight gain?  No   Diabetes:  Is the patient diabetic?  Yes  If diabetic, was a CBG obtained today?  Yes, A1c 6.6 Did the patient bring in their glucometer from home?  No  How often do you monitor your CBG's? Never per pt.   Financial Strains and Diabetes Management:  Are you having any financial strains with the device, your supplies or your medication? No .  Does the patient want to be seen by Chronic Care Management for management of their diabetes?  No  Would the patient like to be referred to a Nutritionist or for Diabetic Management?  No   Diabetic Exams:  Diabetic Eye Exam: Overdue for diabetic eye exam. Pt has been advised about the importance in completing this exam. Patient advised to call and schedule an eye exam. Diabetic Foot Exam: Overdue, Pt has been advised about the importance in completing this exam. Pt is scheduled for diabetic foot exam on n/a.   Interpreter Needed?: No      Activities of Daily Living    07/10/2022    2:30 PM 07/10/2022    2:23 PM  In your present state of health, do you have any difficulty performing the following activities:  Hearing? 1 1  Comment  tv volume increase/hearing test  Vision? 0 1  Difficulty concentrating or making decisions? 1 1  Comment  memory loss  Walking or climbing stairs? 0 0  Comment no stairs.   Dressing or bathing? 0 0  Doing errands, shopping? 0 0  Preparing Food and eating ? N Y  Using the Toilet? N Y  In the past six months, have you accidently leaked urine? Y Y  Do you have problems with loss of bowel control? Y Y  Managing your Medications? N Y  Managing your Finances? N Y  Housekeeping or managing your Housekeeping? N Y    Patient Care Team: Susy Frizzle, MD as PCP - General (Family  Medicine) Constance Haw, MD as PCP - Electrophysiology (Cardiology) Larey Dresser, MD as Consulting Physician (Cardiology) Heath Lark, MD as Consulting Physician (Hematology and Oncology)  Indicate any recent Medical Services you may have received from other than [facility] providers in the past year (date may be approximate).     Assessment:   This is a routine wellness examination for [employer].  Hearing/Vision screen No results found.  Dietary issues and exercise activities discussed: Current Exercise Habits: Home exercise routine, Type of exercise: Other - see comments, Time (Minutes): 30, Frequency (Times/Week): 5, Weekly Exercise (Minutes/Week): 150, Intensity: Mild, Exercise limited by: cardiac condition(s);respiratory conditions(s);Other - see comments   Goals Addressed   None    Depression Screen    07/10/2022    2:18 PM 07/03/2022   12:21 PM 07/04/2021    2:00 PM 02/01/2018    9:28 AM  12/10/2017    3:40 PM 10/04/2017   12:08 PM 08/12/2017    8:43 AM  PHQ 2/9 Scores  PHQ - 2 Score 0 0 0 0 0 0 0    Fall Risk    07/06/2022    8:50 AM 07/04/2021    2:05 PM 05/03/2019    2:59 PM 02/01/2018    9:28 AM 12/10/2017    3:40 PM  Fall Risk   Falls in the past year? 0 1 1 Yes No  Comment   Emmi Telephone Survey: data to providers prior to load    Number falls in past yr: 0 0 1 1   Comment   Emmi Telephone Survey Actual Response = 2    Injury with Fall? 0 0 0 No   Risk for fall due to :  History of fall(s);Impaired vision  Impaired balance/gait   Follow up  Falls prevention discussed       FALL RISK PREVENTION PERTAINING TO THE HOME:  Any stairs in or around the home? No  If so, are there any without handrails?  N/a Home free of loose throw rugs in walkways, pet beds, electrical cords, etc? Yes  Adequate lighting in your home to reduce risk of falls? Yes   ASSISTIVE DEVICES UTILIZED TO PREVENT FALLS:  Life alert?  N/a Use of a cane, walker or w/c?  N/a Grab bars in the  bathroom?  N/a Shower chair or bench in shower?  N/a Elevated toilet seat or a handicapped toilet? No   TIMED UP AND GO:  Was the test performed? No .  Length of time to ambulate 10 feet: n/a sec.   It was a Phone Visit  Cognitive Function:        07/10/2022    2:27 PM 07/04/2021    2:09 PM  6CIT Screen  What Year? 0 points 0 points  What month? 0 points 0 points  What time? 0 points 0 points  Count back from 20 0 points 0 points  Months in reverse 0 points 0 points  Repeat phrase 8 points 0 points  Total Score 8 points 0 points    Immunizations Immunization History  Administered Date(s) Administered   Fluad Quad(high Dose 65+) 06/09/2019   Hep A / Hep B 11/01/2014   Hepatitis A, Ped/Adol-2 Dose 05/24/2014   Influenza Split 08/03/2006, 07/08/2007, 07/10/2010, 06/07/2012, 06/19/2013   Influenza, High Dose Seasonal PF 06/10/2016, 06/16/2017, 07/01/2018, 06/28/2020, 07/01/2021   Influenza,inj,quad, With Preservative 06/05/2017   Influenza-Unspecified 07/18/2015, 06/05/2018   Moderna SARS-COV2 Booster Vaccination 08/20/2020   Moderna Sars-Covid-2 Vaccination 10/16/2019, 11/13/2019   Pneumococcal Conjugate-13 01/26/2014   Pneumococcal Polysaccharide-23 07/08/2007, 10/05/2008, 08/12/2017   Tdap 01/28/2017   Zoster, Live 10/05/2008    TDAP status: Up to date  Flu Vaccine status: Due, Education has been provided regarding the importance of this vaccine. Advised may receive this vaccine at local pharmacy or Health Dept. Aware to provide a copy of the vaccination record if obtained from local pharmacy or Health Dept. Verbalized acceptance and understanding.  Pneumococcal vaccine status: Up to date  Covid-19 vaccine status: Declined, Education has been provided regarding the importance of this vaccine but patient still declined. Advised may receive this vaccine at local pharmacy or Health Dept.or vaccine clinic. Aware to provide a copy of the vaccination record if obtained from  local pharmacy or Health Dept. Verbalized acceptance and understanding.  Qualifies for Shingles Vaccine? Yes   Zostavax completed No   Shingrix Completed?: No.  Education has been provided regarding the importance of this vaccine. Patient has been advised to call insurance company to determine out of pocket expense if they have not yet received this vaccine. Advised may also receive vaccine at local pharmacy or Health Dept. Verbalized acceptance and understanding.  Screening Tests Health Maintenance  Topic Date Due   Diabetic kidney evaluation - Urine ACR  07/21/2013   FOOT EXAM  05/23/2021   COVID-19 Vaccine (3 - Moderna risk series) 07/26/2022 (Originally 09/17/2020)   Zoster Vaccines- Shingrix (1 of 2) 10/10/2022 (Originally 04/23/1965)   HEMOGLOBIN A1C  01/01/2023   OPHTHALMOLOGY EXAM  03/18/2023   Diabetic kidney evaluation - GFR measurement  07/04/2023   TETANUS/TDAP  01/29/2027   Pneumonia Vaccine 48+ Years old  Completed   INFLUENZA VACCINE  Completed   DEXA SCAN  Completed   Hepatitis C Screening  Completed   HPV VACCINES  Aged Out   COLONOSCOPY (Pts 45-34yr Insurance coverage will need to be confirmed)  Discontinued    Health Maintenance  Health Maintenance Due  Topic Date Due   Diabetic kidney evaluation - Urine ACR  07/21/2013   FOOT EXAM  05/23/2021    Colorectal cancer screening: No longer required.   Mammogram status: No longer required due to n/a.  Bone Density status: Completed 02/2017. Results reflect: Bone density results: NORMAL. Repeat every n/a years.  Lung Cancer Screening: (Low Dose CT Chest recommended if Age 78-80years, 30 pack-year currently smoking OR have quit w/in 15years.) does not qualify.   Lung Cancer Screening Referral: n/a Additional Screening:  Hepatitis C Screening: does not qualify; Completed n/a  Vision Screening: Recommended annual ophthalmology exams for early detection of glaucoma and other disorders of the eye. Is the  patient up to date with their annual eye exam?  Yes  Who is the provider or what is the name of the office in which the patient attends annual eye exams? N/a If pt is not established with a provider, would they like to be referred to a provider to establish care? No .   Dental Screening: Recommended annual dental exams for proper oral hygiene  Community Resource Referral / Chronic Care Management: CRR required this visit?  No   CCM required this visit?  No      Plan:     I have personally reviewed and noted the following in the patient's chart:   Medical and social history Use of alcohol, tobacco or illicit drugs  Current medications and supplements including opioid prescriptions. Patient is currently taking opioid prescriptions. Information provided to patient regarding non-opioid alternatives. Patient advised to discuss non-opioid treatment plan with their provider. Functional ability and status Nutritional status Physical activity Advanced directives List of other physicians Hospitalizations, surgeries, and ER visits in previous 12 months Vitals Screenings to include cognitive, depression, and falls Referrals and appointments  In addition, I have reviewed and discussed with patient certain preventive protocols, quality metrics, and best practice recommendations. A written personalized care plan for preventive services as well as general preventive health recommendations were provided to patient.     BColman Cater [place]  07/10/2022   Nurse Notes: n/a

## 2022-07-13 ENCOUNTER — Ambulatory Visit (HOSPITAL_COMMUNITY)
Admission: RE | Admit: 2022-07-13 | Discharge: 2022-07-13 | Disposition: A | Discharge status: Home / Self Care | Payer: Medicare HMO | Source: Ambulatory Visit | Attending: Cardiology | Admitting: Cardiology

## 2022-07-13 ENCOUNTER — Encounter (HOSPITAL_COMMUNITY): Payer: Self-pay | Admitting: Cardiology

## 2022-07-13 VITALS — BP 118/70 | HR 79 | Wt 176.2 lb

## 2022-07-13 DIAGNOSIS — Z79899 Other long term (current) drug therapy: Secondary | ICD-10-CM | POA: Insufficient documentation

## 2022-07-13 DIAGNOSIS — Z7901 Long term (current) use of anticoagulants: Secondary | ICD-10-CM | POA: Insufficient documentation

## 2022-07-13 DIAGNOSIS — I5022 Chronic systolic (congestive) heart failure: Secondary | ICD-10-CM

## 2022-07-13 DIAGNOSIS — R112 Nausea with vomiting, unspecified: Secondary | ICD-10-CM | POA: Insufficient documentation

## 2022-07-13 DIAGNOSIS — I428 Other cardiomyopathies: Secondary | ICD-10-CM | POA: Diagnosis present

## 2022-07-13 DIAGNOSIS — I447 Left bundle-branch block, unspecified: Secondary | ICD-10-CM | POA: Insufficient documentation

## 2022-07-13 DIAGNOSIS — I10 Essential (primary) hypertension: Secondary | ICD-10-CM | POA: Diagnosis not present

## 2022-07-13 DIAGNOSIS — E785 Hyperlipidemia, unspecified: Secondary | ICD-10-CM | POA: Diagnosis not present

## 2022-07-13 DIAGNOSIS — E119 Type 2 diabetes mellitus without complications: Secondary | ICD-10-CM | POA: Insufficient documentation

## 2022-07-13 DIAGNOSIS — I480 Paroxysmal atrial fibrillation: Secondary | ICD-10-CM | POA: Insufficient documentation

## 2022-07-13 DIAGNOSIS — R5383 Other fatigue: Secondary | ICD-10-CM | POA: Diagnosis not present

## 2022-07-13 DIAGNOSIS — Z8616 Personal history of COVID-19: Secondary | ICD-10-CM | POA: Diagnosis not present

## 2022-07-13 LAB — LIPID PANEL
Cholesterol: 104 mg/dL (ref 0–200)
HDL: 38 mg/dL — ABNORMAL LOW (ref >40)
LDL Cholesterol: 47 mg/dL (ref 0–99)
Total CHOL/HDL Ratio: 2.7 RATIO
Triglycerides: 93 mg/dL (ref <150)
VLDL: 19 mg/dL (ref 0–40)

## 2022-07-13 LAB — BRAIN NATRIURETIC PEPTIDE: B Natriuretic Peptide: 44.8 pg/mL (ref 0.0–100.0)

## 2022-07-13 NOTE — Progress Notes (Signed)
Patient ID: [name], female   DOB: 31-Dec-1945, 76 y.o.   MRN: [id number]    Advanced Heart Failure Clinic Note   PCP: Dr. Dennard Schaumann HF: Dr. Aundra Dubin   76 y.o. with history of HTN and type II diabetes presents for followup of CHF.  Back in 2009, she had an echo with normal EF.  She was admitted in 9/13 with chest pain and LBBB.  She had LHC showing no CAD but EF 35-40%.  Echo was a technically difficult study with EF reported as 50-55%.  She has been taking ARB and Coreg.  Repeat echo in 12/13 showed EF 40% with septal hypokinesis.  Cardiac MRI was done in 12/13 as well, with calculated EF 50%, global hypokinesis, and no delayed enhancement.  Echo in 7/15 showed EF 55-60% with mild MR and mild TR (no LBBB at this time).   In 3/16, she began to develop increased dyspnea.  She was seen by Truitt Merle and was noted to have a LBBB again. Echo in 3/16 showed EF down to 35-40%.  She was then seen in followup by Rosaria Ferries.  Alivecor on her phone showed a period of HR up to 160s that appeared to be atrial fibrillation.  She felt tachypalpitations. Coreg was increased and she was started on apixaban 5 mg bid. CPX in 4/16 showed a submaximal effort but normal spirometry and normal peak VO2.  She did have ST changes possibly concerning for ischemia (but has baseline LBBB so hard to interpret).    Cardiolite in 5/16 showed on ischemia or infarction.  Most recent echo in 2/17 showed EF 45-50%, diffuse hypokinesis, grade I diastolic dysfunction, normal RV size and systolic function.  Echo 1/18 with EF stable at 45%.  Echo in 4/19 showed EF 50-55% with septal dyssynergy.   Echo in 1/21 showed EF 40-45%, normal RV.  Cardiac MRI was done in 5/21, showing LV EF 54%, septal-lateral dyssynchrony c/w LBBB, RV EF 53%, no LGE.   Patient had COVID-19 in 7/22.   Echo in 12/22 showed EF 40-45%, septal-lateral dyssynchrony, normal RV.    Patient had atrial fibrillation ablation in 4/23.  Zio monitor in 5/23 showed no  AF.  Echo in 5/23 showed EF 55-60%, paradoxical septal motion, normal RV.   Patient returns for followup of CHF and atrial fibrillation.  Weight is down 3 lbs.  She has been having episodes of chest heaviness.  No definite trigger, not related to exertion.  Episodes can last up to 10-15 minutes and are associated with profound weakness.  No palpitations.  She has used her Kardia device and has NOT noted atrial fibrillation with these episodes.  She does not get dyspnea or chest pain with activity, able to do yardwork (can use a light chainsaw in her yard). She has had episodes at night where she will wake up with nausea and vomit, there is concern that this is severe GERD and she is on Dexilant.  She is off Coreg and on bisoprolol as she thought that the [place] may have caused diarrhea.  She also did not tolerate Entresto.   ECG (personally reviewed): NSR, LBBB 148 msec  Labs (9/13): TSH 4.884 (elevated), proBNP 155 Labs (10/13): K 3.6, creatinine 0.8 Labs (1/14): K 3.7, creatinine 0.8, BNP not elevated, ANA weakly positive, TSH normal Labs (7/14): BNP normal Labs (11/14): K 4.1, creatinine 0.73 Labs (10/15): K 4.2, creatinine 0.73, LDL 45 Labs (3/16): BNP 46, HCT 42.2, TSH normal Labs (4/16): K 4.1, creatinine  0.78 => 0.8, HCT 39.3 Labs (1/17): K 4.6, creatinine 1.06, hgb 13.1, BNP 22 Labs (10/17): LDL 49, HDL 37, K 4.8, creatinine 0.92, TSH normal, BNP 62 Labs (4/18): LDL 48, K 4.4, creatinine 0.91 Labs (7/19): K 4.3, creatinine 1.02, 11.7  Labs (6/20): K 4.8, creatinine 0.94 Labs (8/21): K 4.8, creatinine 0.91, LDL 42 Labs (1/22): K 4.5, creaitnine 1.04 Labs (10/22): K 4.2, creatinine 1.01 Labs (9/23): K 4.4, creatinine 1.05, hgb 10.2  PMH: 1. LBBB/IVCD: Now persistent.   2. Breast cancer s/p bilateral mastectomy. 3. GERD 4. Type II diabetes mellitus 5. HTN 6. Asthma 7. PE in 2001 8. Hyperlipidemia 9. Nonischemic Cardiomyopathy: Possible LBBB cardiomyopathy. Echo 8/09 with 60%.   Admitted in 9/13 with chest pain.  LHC showed no angiographic CAD and EF 35-40%.  Echo at that time was read as showing EF 50-55% but was a technically difficult study.  Echo (12/13): EF 40% with septal hypokinesis, normal RV size and systolic function, no significant valvular abnormalities.  Cardiac MRI (12/13): EF 50%, global hypokinesis, no delayed enhancement.  Echo (7/14) with EF 50-55%, mild LV dilation, mild LAE.  Echo (7/15) with EF 55-60%, mild MR and mild TR.  Echo (3/16) with EF 35-40%, septal-lateral dyssynchrony.  CPX (4/16) with RER 0.98 (submaximal), peak VO2 19.5, VE/VCO2 slope 29.4 => normal spirometry, normal peak VO2, but ST changes suggest possibility of ischemia.  Lexiscan Cardiolite (5/16) with no ischemia/infarction. - Echo (2/17) with EF 45-50%, diffuse hypokinesis, grade I diastolic dysfunction, normal RV size and systolic function.  - Echo (1/18) with EF 45%, septal-lateral dyssynchrony, septal hypokinesis, normal RV size and systolic function.  - Echo (4/19) with EF 50-55%, mild LVH, septal-lateral dyssynchony.  - Cardiac MRI (5/21): LV EF 54%, septal-lateral dyssynchrony c/w LBBB, RV EF 53%, no LGE.  - Echo (12/22): EF 40-45%, septal-lateral dyssynchrony, normal RV - Echo (5/23): EF 55-60%, paradoxical septal motion, normal RV.  10. Supraclavicular lymphadenopathy: Stable by CT back to 2009, suspect benign.  11. Atrial fibrillation: Paroxysmal.  Holter (4/16) with rare PACs and PVCs, short run 5 beats SVT.  - Atrial fibrillation transiently in 1/21.  - Did not tolerate Multaq.  - Atrial fibrillation ablation in 4/23.  12. Right TKR 6/19, left TKR 1/20 13. COVID-19 7/22  SH: Married, lives in [facility], never smoked.  FH: Uncle with ? SCD.  Brother with ? SCD.    ROS: All systems reviewed and negative except as per HPI.   Current Outpatient Medications  Medication Sig Dispense Refill   ACCU-CHEK AVIVA PLUS test strip USE TO CHECK BLOOD SUGAR EVERY  MORNING DX: E11.9 100 strip 3   acetaminophen (TYLENOL) 500 MG tablet Take 1,000 mg by mouth every 6 (six) hours as needed for mild pain or moderate pain.     atorvastatin (LIPITOR) 40 MG tablet Take 1 tablet (40 mg total) by mouth daily. 90 tablet 3   bisoprolol (ZEBETA) 5 MG tablet Take 0.5 tablets (2.5 mg total) by mouth daily. 45 tablet 3   Blood Glucose Monitoring Suppl (ACCU-CHEK AVIVA PLUS) w/Device KIT Use to check FBS QAM - DX:E11.9 1 kit 1   Cholecalciferol (VITAMIN D3) 50 MCG (2000 UT) CAPS Take 2,000 Units by mouth daily.     clobetasol ointment (TEMOVATE) 0.81 % 1 application. daily as needed (Rash).     Coenzyme Q10 (CO Q 10 PO) Take 400 mg by mouth daily.     colesevelam (WELCHOL) 625 MG tablet TAKE 3 TABLETS BY MOUTH  TWICE A DAY WITH A MEAL 540 tablet 0   dexlansoprazole (DEXILANT) 60 MG capsule TAKE 1 CAPSULE BY MOUTH EVERY DAY 90 capsule 1   diltiazem (CARDIZEM) 30 MG tablet TAKE 1 TABLET EVERY 4 HOURS AS NEEDED FOR AFIB HEART RATE >100 45 tablet 1   ELIQUIS 5 MG TABS tablet TAKE 1 TABLET BY MOUTH TWICE A DAY 180 tablet 3   famotidine (PEPCID) 40 MG tablet Take 1 tablet (40 mg total) by mouth daily. 30 tablet 0   fluticasone (FLONASE) 50 MCG/ACT nasal spray Place 2 sprays into both nostrils daily as needed for allergies or rhinitis.     Ginkgo Biloba 120 MG TABS Take 120 mg by mouth daily.     Glucosamine-Chondroitin (COSAMIN DS PO) Take 1 tablet by mouth 2 (two) times a day.     Lancets (ACCU-CHEK SOFT TOUCH) lancets Use to check BS QAM DX: E11.9 100 each 3   losartan (COZAAR) 100 MG tablet Take 1 tablet (100 mg total) by mouth daily. 90 tablet 3   Magnesium 250 MG TABS Take 250 mg by mouth daily.     Multiple Vitamin (MULTIVITAMIN WITH MINERALS) TABS tablet Take 1 tablet by mouth daily. Senior Multivitamin Iron Free Formula     Omega-3 Fatty Acids (OMEGA 3 PO) Take 500 mg by mouth 2 (two) times daily.     Potassium 99 MG TABS Take 1 tablet by mouth every morning.      spironolactone (ALDACTONE) 25 MG tablet Take 1 tablet (25 mg total) by mouth daily. 90 tablet 3   vitamin B-12 (CYANOCOBALAMIN) 1000 MCG tablet Take 1,000 mcg by mouth 2 (two) times daily.     vitamin C (ASCORBIC ACID) 500 MG tablet Take 500 mg by mouth daily. Ultra C     zafirlukast (ACCOLATE) 20 MG tablet Take 1 tablet (20 mg total) by mouth 2 (two) times daily. 180 tablet 3   No current facility-administered medications for this encounter.    BP 118/70   Pulse 79   Wt 79.9 kg (176 lb 3.2 oz)   SpO2 97%   BMI 32.23 kg/m    Wt Readings from Last 3 Encounters:  07/13/22 79.9 kg (176 lb 3.2 oz)  04/13/22 81.6 kg (180 lb)  03/06/22 81.3 kg (179 lb 3.2 oz)   General: NAD Neck: No JVD, no thyromegaly or thyroid nodule.  Lungs: Clear to auscultation bilaterally with normal respiratory effort. CV: Nondisplaced PMI.  Heart regular S1/S2, no S3/S4, no murmur.  No peripheral edema.  No carotid bruit.  Normal pedal pulses.  Abdomen: Soft, nontender, no hepatosplenomegaly, no distention.  Skin: Intact without lesions or rashes.  Neurologic: Alert and oriented x 3.  Psych: Normal affect. Extremities: No clubbing or cyanosis.  HEENT: Normal.   Assessment/Plan: 1. Nonischemic cardiomyopathy:   No CAD on cath 9/13 but EF 35-40% at that time.  ECG showed LBBB.  LBBB resolved and EF normalized.  However, in 3/16, patient had fatigue and dyspnea => repeat echo with EF back down to 35-40%, she now has a persistent LBBB. This may be a LBBB cardiomyopathy.  CPX in 4/16 was submaximal but showed normal spirometry and actually near normal peak VO2.  Echo in 4/19 showed EF stable at 50-55% with septal-lateral dyssynchrony.  Cardiac MRI in 5/21 showed LV EF 54%, septal-lateral dyssynchrony c/w LBBB, RV EF 53%, no LGE.  Echo in 12/22 showed EF 40-45%, septal-lateral dyssynchrony, normal RV.  Echo in 5/23 showed EF 55-60%, paradoxical septal motion,  normal RV.  Generally, NYHA class II though she has "spells"  of chest heaviness and weakness.  - I will have her continue bisoprolol 2.5 mg daily (though I do not think that Coreg caused her diarrhea).   - She did not tolerate Entresto well, continue losartan (EF is now up to 55-60%).   - Continue spironolactone 25 mg daily.  - Intolerable side effects with [place].   - With chest heaviness and weakness, I will arrange for coronary CT angiogram to rule out significant CAD. - Check BNP today.   2. Hyperlipidemia:  Continue atorvastatin 40 mg qhs.  - Check lipids today.  3. Atrial fibrillation: She is significantly symptomatic when in AF.  She was unable to take Multaq due to side effects.  NSR s/p atrial fibrillation ablation in 4/23.  Spells of chest heaviness and weakness do not appear to be due to atrial fibrillation, Kardia did not show AF during episodes.  - Continue Eliquis 5 mg bid.   4. LBBB: Persistent and may have been cause of mild cardiomyopathy.  5. Nausea/vomiting/diarrhea: She has prominent GI symptoms.  She is on Dexilant.  - Followup with PCP/GI.   Followup in 3 months with APP if coronary CTA is low risk.   Loralie Champagne 07/13/2022

## 2022-07-13 NOTE — Patient Instructions (Signed)
There has been no changes to your medications.  Labs done today, your results will be available in MyChart, we will contact you for abnormal readings.  Your physician has requested that you have cardiac CT. Cardiac computed tomography (CT) is a painless test that uses an x-ray machine to take clear, detailed pictures of your heart. For further information please visit [email or web address]. Please follow instruction sheet as given. ONCE APPROVED BY YOUR INSURANCE COMPANY YOU WILL BE CALLED TO HAVE THE TEST ARRANGED.   Your physician recommends that you schedule a follow-up appointment in: 3 months ( January 2024)  ** please call the office in [facility] November to have your appointment arranged **  If you have any questions or concerns before your next appointment please send [place] a message through [facility] or call our office at [phone].    TO LEAVE A MESSAGE FOR THE NURSE SELECT OPTION 2, PLEASE LEAVE A MESSAGE INCLUDING: YOUR NAME DATE OF BIRTH CALL BACK NUMBER REASON FOR CALL**this is important as we prioritize the call backs  YOU WILL RECEIVE A CALL BACK THE SAME DAY AS LONG AS YOU CALL BEFORE 4:00 PM  At the [facility], you and your health needs are our priority. As part of our continuing mission to provide you with exceptional heart care, we have created designated Provider Care Teams. These Care Teams include your primary Cardiologist (physician) and Advanced Practice Providers (APPs- Physician Assistants and Nurse Practitioners) who all work together to provide you with the care you need, when you need it.   You may see any of the following providers on your designated Care Team at your next follow up: Dr Glori Bickers Dr Loralie Champagne Dr. Roxana Hires, NP Lyda Jester, [place] [facility] [place], [place] Forestine Na, NP Audry Riles, PharmD   Please be sure to bring in all your medications bottles to every appointment.

## 2022-07-26 ENCOUNTER — Other Ambulatory Visit (HOSPITAL_COMMUNITY): Payer: Self-pay | Admitting: Cardiology

## 2022-07-28 ENCOUNTER — Other Ambulatory Visit: Payer: Self-pay | Admitting: Family Medicine

## 2022-07-28 DIAGNOSIS — R110 Nausea: Secondary | ICD-10-CM

## 2022-07-29 ENCOUNTER — Other Ambulatory Visit: Payer: Self-pay | Admitting: Family Medicine

## 2022-07-29 NOTE — Telephone Encounter (Signed)
   Notes to clinic:  Visit from 07/03/22 said to try pepcid for two weeks, please assess.      Requested Prescriptions  Pending Prescriptions Disp Refills   famotidine (PEPCID) 40 MG tablet [Pharmacy Med Name: FAMOTIDINE 40 MG TABLET] 90 tablet 1    Sig: TAKE 1 TABLET BY MOUTH EVERY DAY     Gastroenterology:  H2 Antagonists Passed - 07/28/2022  8:32 AM      Passed - Valid encounter within last 12 months    Recent Outpatient Visits           4 months ago Right leg pain   [place] Dennard Schaumann, Cammie Mcgee, MD   5 months ago Right sided sciatica   [place] Dennard Schaumann, Cammie Mcgee, MD   6 months ago Shortness of breath   [place] Susy Frizzle, MD   1 year ago Controlled type 2 diabetes mellitus with complication, without long-term current use of insulin ([place])   [place] Susy Frizzle, MD   2 years ago Controlled type 2 diabetes mellitus with complication, without long-term current use of insulin ([place])   [facility] [place], Cammie Mcgee, MD

## 2022-07-30 ENCOUNTER — Telehealth (HOSPITAL_COMMUNITY): Payer: Self-pay | Admitting: *Deleted

## 2022-07-30 ENCOUNTER — Telehealth (HOSPITAL_COMMUNITY): Payer: Self-pay | Admitting: Emergency Medicine

## 2022-07-30 NOTE — Telephone Encounter (Signed)
Attempted to call patient regarding upcoming cardiac CT appointment. °Left message on voicemail with name and callback number °Arieanna Pressey RN Navigator Cardiac Imaging °[facility] °[phone] Office °[phone] Cell ° °

## 2022-07-30 NOTE — Telephone Encounter (Signed)
Auth request for cardiac ct faxed to [employer] 10/25

## 2022-07-30 NOTE — Telephone Encounter (Signed)
Requested Prescriptions  Pending Prescriptions Disp Refills  . colesevelam (WELCHOL) 625 MG tablet [Pharmacy Med Name: COLESEVELAM 625 MG TABLET] 540 tablet 0    Sig: TAKE 3 TABLETS BY MOUTH TWICE A DAY WITH A MEAL     Cardiovascular:  Antilipid - Bile Acid Sequestrants Failed - 07/29/2022  8:23 AM      Failed - Lipid Panel in normal range within the last 12 months    Cholesterol  Date Value Ref Range Status  07/13/2022 104 0 - 200 mg/dL Final   LDL Cholesterol (Calc)  Date Value Ref Range Status  05/23/2020 42 mg/dL (calc) Final    Comment:    Reference range: <100 . Desirable range <100 mg/dL for primary prevention;   <70 mg/dL for patients with CHD or diabetic patients  with > or = 2 CHD risk factors. Marland Kitchen LDL-C is now calculated using the Martin-Hopkins  calculation, which is a validated novel method providing  better accuracy than the Friedewald equation in the  estimation of LDL-C.  Cresenciano Genre et al. Annamaria Helling. 9935;701(77): 2061-2068  (http://education.QuestDiagnostics.com/faq/FAQ164)    LDL Cholesterol  Date Value Ref Range Status  07/13/2022 47 0 - 99 mg/dL Final    Comment:           Total Cholesterol/HDL:CHD Risk Coronary Heart Disease Risk Table                     Men   Women  1/2 Average Risk   3.4   3.3  Average Risk       5.0   4.4  2 X Average Risk   9.6   7.1  3 X Average Risk  23.4   11.0        Use the calculated Patient Ratio above and the CHD Risk Table to determine the patient's CHD Risk.        ATP III CLASSIFICATION (LDL):  <100     mg/dL   Optimal  100-129  mg/dL   Near or Above                    Optimal  130-159  mg/dL   Borderline  160-189  mg/dL   High  >190     mg/dL   Very High Performed at [facility] [address] [place], [place] [address]    HDL  Date Value Ref Range Status  07/13/2022 38 (L) >40 mg/dL Final   Triglycerides  Date Value Ref Range Status  07/13/2022 93 <150 mg/dL Final         Passed - Valid  encounter within last 12 months    Recent Outpatient Visits          4 months ago Right leg pain   [place] Dennard Schaumann, Cammie Mcgee, MD   5 months ago Right sided sciatica   [place] Dennard Schaumann, Cammie Mcgee, MD   6 months ago Shortness of breath   [place] Susy Frizzle, MD   1 year ago Controlled type 2 diabetes mellitus with complication, without long-term current use of insulin ([place])   [place] Susy Frizzle, MD   2 years ago Controlled type 2 diabetes mellitus with complication, without long-term current use of insulin ([place])   [place] [facility] [place], Cammie Mcgee, MD             . dexlansoprazole (DEXILANT) 60 MG capsule [facility]  Med Name: DEXLANSOPRAZOLE DR 60 MG CAP] 90 capsule 0    Sig: TAKE 1 CAPSULE BY MOUTH EVERY DAY     Gastroenterology: Proton Pump Inhibitors Passed - 07/29/2022  8:23 AM      Passed - Valid encounter within last 12 months    Recent Outpatient Visits          4 months ago Right leg pain   [place] Dennard Schaumann, Cammie Mcgee, MD   5 months ago Right sided sciatica   [place] Dennard Schaumann, Cammie Mcgee, MD   6 months ago Shortness of breath   [place] Susy Frizzle, MD   1 year ago Controlled type 2 diabetes mellitus with complication, without long-term current use of insulin ([place])   [place] Susy Frizzle, MD   2 years ago Controlled type 2 diabetes mellitus with complication, without long-term current use of insulin ([place])   [facility] [place], Cammie Mcgee, MD

## 2022-07-30 NOTE — Telephone Encounter (Signed)
No auth in place, postponed CCTA appt for 08/11/22  Marchia Bond RN Navigator Cardiac Imaging [facility] [phone] Office  [phone] Cell

## 2022-07-31 ENCOUNTER — Ambulatory Visit (HOSPITAL_COMMUNITY): Payer: Medicare HMO

## 2022-08-10 ENCOUNTER — Telehealth (HOSPITAL_COMMUNITY): Payer: Self-pay | Admitting: Emergency Medicine

## 2022-08-10 ENCOUNTER — Ambulatory Visit (INDEPENDENT_AMBULATORY_CARE_PROVIDER_SITE_OTHER): Payer: Medicare HMO | Admitting: Family Medicine

## 2022-08-10 VITALS — BP 118/62 | HR 83 | Ht 62.0 in | Wt 177.0 lb

## 2022-08-10 DIAGNOSIS — M2550 Pain in unspecified joint: Secondary | ICD-10-CM

## 2022-08-10 DIAGNOSIS — R0781 Pleurodynia: Secondary | ICD-10-CM | POA: Diagnosis not present

## 2022-08-10 NOTE — Telephone Encounter (Signed)
Attempted to call patient regarding upcoming cardiac CT appointment. °Left message on voicemail with name and callback number °Aaryn Parrilla RN Navigator Cardiac Imaging °[place] [facility] °[phone] Office °[phone] Cell ° °

## 2022-08-10 NOTE — Telephone Encounter (Signed)
Reaching out to patient to offer assistance regarding upcoming cardiac imaging study; pt verbalizes understanding of appt date/time, parking situation and where to check in, pre-test NPO status and medications ordered, and verified current allergies; name and call back number provided for further questions should they arise Marchia Bond RN [place] [facility] [phone] office [phone] cell  Arrival [address] entrance Difficult iv  Asked to take full dose bisoprolol for HR control

## 2022-08-10 NOTE — Progress Notes (Signed)
Subjective:    Patient ID: [name], female    DOB: Nov 15, 1945, 76 y.o.   MRN: [id number]  HPI  I originally saw the patient in September for pain on her right flank radiating into her right chest and right breast area.  She states that the pain has been there for several months.  Today she states that the pain occurs in her right axilla and radiates down her right ribs all the way down to her pelvis.  She states that it hurts to lay on her side at night to sleep.  It hurts to move.  It hurts to twist.  She denies any cough or pleurisy or hemoptysis.  She denies any fevers or chills.  She denies any weight loss.  She denies any nausea or vomiting.  She had a CT scan of the chest in March looking at coronary artery morphology.  She has a CT scan scheduled tomorrow upper chest per her report Past Medical History:  Diagnosis Date   Allergy 2000   Allergy history unknown    Arthritis    oa   Asthma    takes acolate for   Breast cancer ([place]) 1999; 2008   hx of bilateral breast cancer   Breast cancer, left breast ([place]) 10/27/2012   3.2 cm ER positive, Her-2 negative, 1 node positive S/P mastectomy/TTRAM reconstruction 4/08   Breast cancer, right breast ([place]) 10/27/2012   3.2 cm ER/PR positive, 1 node positive, Her-2 negative Rx w mastectomy, AC -T chemo, followed by aromatase inhibitor x 5 years dx 4/08   Cancer ([place])    breast CA   Cataracts, bilateral    CHF (congestive heart failure) (HCC)    Dehydration after exertion    after she went berry picking in the summer heat . see ED visit    Diabetes ([place])    DM2 (diabetes mellitus, type 2) ([place])    on welchol for   Dyspnea    with exertion   Elevated cholesterol    GERD (gastroesophageal reflux disease)    Hiatal hernia    Hypertension    LBBB (left bundle branch block) 10/27/2012   Leg cramps    NICM (nonischemic cardiomyopathy) ([place])    a. Echo:  06/14/12: Poor endocardial definition, possible septal HK, EF 50-55%, normal wall  motion, mild LAE ;  b.  [facility] 9/13:  normal cors, EF 35-40%,  c. Echo 7/14: EF 50-55%, mild LAE   Other fatigue 01/01/2015   PONV (postoperative nausea and vomiting)    Pulmonary embolus ([place]) 2001   after tram flap surgery   Past Surgical History:  Procedure Laterality Date   ATRIAL FIBRILLATION ABLATION N/A 01/06/2022   Procedure: ATRIAL FIBRILLATION ABLATION;  Surgeon: Constance Haw, MD;  Location: [facility];  Service: Cardiovascular;  Laterality: N/A;   BREAST SURGERY  2000 &2005   DILATION AND CURETTAGE OF UTERUS   39yr ago   x 2   EYE SURGERY  2020   JOINT REPLACEMENT     KNEE CLOSED REDUCTION Right 06/01/2018   Procedure: CLOSED MANIPULATION RIGHT KNEE;  Surgeon: AGaynelle Arabian MD;  [facility];  Service: Orthopedics;  Laterality: Right;   KNEE CLOSED REDUCTION Left 02/22/2019   Procedure: CLOSED MANIPULATION KNEE;  Surgeon: AGaynelle Arabian MD;  [facility];  Service: Orthopedics;  Laterality: Left;  131m   KNEE SURGERY  2006   left torn cartilage repair   LEFT HEART CATHETERIZATION WITH CORONARY ANGIOGRAM N/A  06/15/2012   Procedure: LEFT HEART CATHETERIZATION WITH CORONARY ANGIOGRAM;  Surgeon: Wellington Hampshire, MD;  Location: [facility];  Service: Cardiovascular;  Laterality: N/A;   MASTECTOMY     bilateral   RECONSTRUCTION BREAST W/ TRAM FLAP Left 2001   TOTAL KNEE ARTHROPLASTY Right 03/07/2018   Procedure: RIGHT TOTAL KNEE ARTHROPLASTY;  Surgeon: Gaynelle Arabian, MD;  [facility];  Service: Orthopedics;  Laterality: Right;   TOTAL KNEE ARTHROPLASTY Left 10/31/2018   Procedure: TOTAL KNEE ARTHROPLASTY;  Surgeon: Gaynelle Arabian, MD;  [facility];  Service: Orthopedics;  Laterality: Left;  23mn   Current Outpatient Medications on File Prior to Visit  Medication Sig Dispense Refill   ACCU-CHEK AVIVA PLUS test strip USE TO CHECK BLOOD SUGAR EVERY MORNING DX: E11.9 100 strip 3   acetaminophen (TYLENOL) 500 MG tablet Take 1,000 mg by mouth  every 6 (six) hours as needed for mild pain or moderate pain.     atorvastatin (LIPITOR) 40 MG tablet Take 1 tablet (40 mg total) by mouth daily. 90 tablet 3   bisoprolol (ZEBETA) 5 MG tablet Take 0.5 tablets (2.5 mg total) by mouth daily. 45 tablet 3   Blood Glucose Monitoring Suppl (ACCU-CHEK AVIVA PLUS) w/Device KIT Use to check FBS QAM - DX:E11.9 1 kit 1   Cholecalciferol (VITAMIN D3) 50 MCG (2000 UT) CAPS Take 2,000 Units by mouth daily.     clobetasol ointment (TEMOVATE) 05.46% 1 application. daily as needed (Rash).     Coenzyme Q10 (CO Q 10 PO) Take 400 mg by mouth daily.     colesevelam (WELCHOL) 625 MG tablet TAKE 3 TABLETS BY MOUTH TWICE A DAY WITH A MEAL 540 tablet 0   dexlansoprazole (DEXILANT) 60 MG capsule TAKE 1 CAPSULE BY MOUTH EVERY DAY 90 capsule 0   diltiazem (CARDIZEM) 30 MG tablet TAKE 1 TABLET EVERY 4 HOURS AS NEEDED FOR AFIB HEART RATE >100 45 tablet 1   ELIQUIS 5 MG TABS tablet TAKE 1 TABLET BY MOUTH TWICE A DAY 180 tablet 3   famotidine (PEPCID) 40 MG tablet TAKE 1 TABLET BY MOUTH EVERY DAY 90 tablet 1   fluticasone (FLONASE) 50 MCG/ACT nasal spray Place 2 sprays into both nostrils daily as needed for allergies or rhinitis.     Ginkgo Biloba 120 MG TABS Take 120 mg by mouth daily.     Glucosamine-Chondroitin (COSAMIN DS PO) Take 1 tablet by mouth 2 (two) times a day.     Lancets (ACCU-CHEK SOFT TOUCH) lancets Use to check BS QAM DX: E11.9 100 each 3   losartan (COZAAR) 100 MG tablet TAKE 1 TABLET BY MOUTH EVERY DAY 90 tablet 3   Magnesium 250 MG TABS Take 250 mg by mouth daily.     Multiple Vitamin (MULTIVITAMIN WITH MINERALS) TABS tablet Take 1 tablet by mouth daily. Senior Multivitamin Iron Free Formula     Omega-3 Fatty Acids (OMEGA 3 PO) Take 500 mg by mouth 2 (two) times daily.     Potassium 99 MG TABS Take 1 tablet by mouth every morning.     spironolactone (ALDACTONE) 25 MG tablet Take 1 tablet (25 mg total) by mouth daily. 90 tablet 3   vitamin B-12  (CYANOCOBALAMIN) 1000 MCG tablet Take 1,000 mcg by mouth 2 (two) times daily.     vitamin C (ASCORBIC ACID) 500 MG tablet Take 500 mg by mouth daily. Ultra C     zafirlukast (ACCOLATE) 20 MG tablet Take 1 tablet (20 mg total) by  mouth 2 (two) times daily. 180 tablet 3   No current facility-administered medications on file prior to visit.   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   Social Determinants of Health   Financial Resource Strain: Low Risk  (07/10/2022)   Overall Financial Resource Strain (CARDIA)    Difficulty of Paying Living Expenses: Not hard at all  Food Insecurity: No Food Insecurity (07/10/2022)   Hunger Vital Sign    Worried About Running Out of Food in the Last Year: Never true    Ran Out of Food in the Last Year: Never true  Transportation Needs: No Transportation Needs (07/10/2022)   PRAPARE - Hydrologist (Medical): No    Lack of Transportation (Non-Medical): No  Physical Activity: Sufficiently Active (07/10/2022)   Exercise Vital Sign    Days of Exercise per Week: 5 days    Minutes  of Exercise per Session: 40 min  Stress: No Stress Concern Present (07/10/2022)   [place]    Feeling of Stress : Not at all  Social Connections: Moderately Isolated (07/10/2022)   Social Connection and Isolation Panel [NHANES]    Frequency of Communication with Friends and Family: Twice a week    Frequency of Social Gatherings with Friends and Family: Three times a week    Attends Religious Services: Never    Active Member of Clubs or Organizations: No    Attends Archivist Meetings: Never    Marital Status: Married  Human resources officer Violence: Not At Risk (07/10/2022)   Humiliation, Afraid, Rape, and Kick questionnaire    Fear of Current or Ex-Partner: No    Emotionally Abused: No    Physically Abused: No    Sexually Abused: No      Review of Systems  All other systems reviewed and are negative.      Objective:   Physical Exam Vitals reviewed.  Constitutional:      General: She is not in acute distress.    Appearance: She is well-developed. She is not diaphoretic.  Cardiovascular:     Rate and Rhythm: Normal rate and regular rhythm.     Heart sounds: Murmur heard.     No friction rub. No gallop.  Pulmonary:     Effort: Pulmonary effort is normal. No respiratory distress.     Breath sounds: Normal breath sounds. No stridor. No wheezing or rales.    Chest:  Abdominal:     General: Bowel sounds are normal.     Palpations: Abdomen is soft.  Musculoskeletal:     Cervical back: Neck supple.     Right lower leg: No edema.     Left lower leg: No edema.  Skin:    Coloration: Skin is not jaundiced.  Neurological:     General: No focal deficit present.     Mental Status: She is oriented to person, place, and time. Mental status is at baseline.           Assessment & Plan:  Rib pain on right side - Plan: CBC with Differential/Platelet, Sedimentation rate, C-reactive protein, Rheumatoid  factor, Protein electrophoresis, serum  Polyarthralgia - Plan: CBC with Differential/Platelet, Sedimentation rate, C-reactive protein, Rheumatoid factor, Protein electrophoresis, serum Patient has had pain in her ribs now for several months.  She has a CT scan of the chest scheduled for tomorrow.  We will wait to see the results of this.  We would also need to consider a thoracic spine MRI to evaluate for thoracic radiculopathy.  I will also check a CRP, sed rate, CBC, rheumatoid factor, and serum protein electrophoresis to evaluate for any evidence of underlying skeletal based neoplasm such as multiple myeloma, leukemia, etc.  She also has been told in the past that she has "rheumatism".  I have no history of this and therefore I will check a rheumatoid factor to see if she could have underlying rheumatoid arthritis that may cause costochondral pain.

## 2022-08-11 ENCOUNTER — Ambulatory Visit (HOSPITAL_COMMUNITY)
Admission: RE | Admit: 2022-08-11 | Discharge: 2022-08-11 | Disposition: A | Discharge status: Home / Self Care | Payer: Medicare HMO | Source: Ambulatory Visit | Attending: Cardiology | Admitting: Cardiology

## 2022-08-11 DIAGNOSIS — I5022 Chronic systolic (congestive) heart failure: Secondary | ICD-10-CM | POA: Insufficient documentation

## 2022-08-11 MED ORDER — NITROGLYCERIN 0.4 MG SL SUBL
0.8000 mg | SUBLINGUAL_TABLET | Freq: Once | SUBLINGUAL | Status: AC
  Administered 2022-08-11: 0.8 mg via SUBLINGUAL

## 2022-08-11 MED ORDER — IOHEXOL 350 MG/ML SOLN
100.0000 mL | Freq: Once | INTRAVENOUS | Status: AC | PRN
  Administered 2022-08-11: 100 mL via INTRAVENOUS

## 2022-08-11 MED ORDER — NITROGLYCERIN 0.4 MG SL SUBL
SUBLINGUAL_TABLET | SUBLINGUAL | Status: AC
  Filled 2022-08-11: qty 2

## 2022-08-12 ENCOUNTER — Telehealth (HOSPITAL_COMMUNITY): Payer: Self-pay

## 2022-08-12 LAB — CBC WITH DIFFERENTIAL/PLATELET
Absolute Monocytes: 627 cells/uL (ref 200–950)
Basophils Absolute: 40 cells/uL (ref 0–200)
Basophils Relative: 0.7 %
Eosinophils Absolute: 143 cells/uL (ref 15–500)
Eosinophils Relative: 2.5 %
HCT: 29.8 % — ABNORMAL LOW (ref 35.0–45.0)
Hemoglobin: 9.9 g/dL — ABNORMAL LOW (ref 11.7–15.5)
Lymphs Abs: 1932 cells/uL (ref 850–3900)
MCH: 30.8 pg (ref 27.0–33.0)
MCHC: 33.2 g/dL (ref 32.0–36.0)
MCV: 92.8 fL (ref 80.0–100.0)
MPV: 9.5 fL (ref 7.5–12.5)
Monocytes Relative: 11 %
Neutro Abs: 2958 cells/uL (ref 1500–7800)
Neutrophils Relative %: 51.9 %
Platelets: 312 10*3/uL (ref 140–400)
RBC: 3.21 10*6/uL — ABNORMAL LOW (ref 3.80–5.10)
RDW: 12.4 % (ref 11.0–15.0)
Total Lymphocyte: 33.9 %
WBC: 5.7 10*3/uL (ref 3.8–10.8)

## 2022-08-12 LAB — PROTEIN ELECTROPHORESIS, SERUM
Albumin ELP: 3.8 g/dL (ref 3.8–4.8)
Alpha 1: 0.3 g/dL (ref 0.2–0.3)
Alpha 2: 0.8 g/dL (ref 0.5–0.9)
Beta 2: 0.4 g/dL (ref 0.2–0.5)
Beta Globulin: 0.5 g/dL (ref 0.4–0.6)
Gamma Globulin: 0.8 g/dL (ref 0.8–1.7)
Total Protein: 6.5 g/dL (ref 6.1–8.1)

## 2022-08-12 LAB — SEDIMENTATION RATE: Sed Rate: 25 mm/h (ref 0–30)

## 2022-08-12 LAB — RHEUMATOID FACTOR: Rheumatoid fact SerPl-aCnc: <14 IU/mL (ref <14)

## 2022-08-12 LAB — C-REACTIVE PROTEIN: CRP: 1 mg/L (ref <8.0)

## 2022-08-12 NOTE — Telephone Encounter (Signed)
Patient aware of results.

## 2022-09-18 ENCOUNTER — Encounter: Payer: Self-pay | Admitting: Family Medicine

## 2022-09-18 ENCOUNTER — Ambulatory Visit (INDEPENDENT_AMBULATORY_CARE_PROVIDER_SITE_OTHER): Payer: Medicare HMO | Admitting: Family Medicine

## 2022-09-18 VITALS — BP 128/78 | HR 80 | Ht 62.0 in | Wt 176.0 lb

## 2022-09-18 DIAGNOSIS — R197 Diarrhea, unspecified: Secondary | ICD-10-CM

## 2022-09-18 NOTE — Progress Notes (Signed)
Subjective:    Patient ID: [name], female    DOB: 1946-02-17, 76 y.o.   MRN: [id number]  HPI  Patient reports a 1 month intermittent diarrhea.  She states that she will have episodes of diarrhea that occur for several days and then she will go several days without having diarrhea.  She states the diarrhea is loose and watery.  She denies any abdominal pain.  She denies any cramps.  She denies any bloody diarrhea.  She denies any recent travel or fever or chills or nausea or vomiting.  She denies any foods that seem to trigger the diarrhea.  She denies any weight loss.  She denies any chills.  She is taking a magnesium supplement and a potassium supplement.  She is not taking any stool softener.  Her abdominal exam today is benign  Past Medical History:  Diagnosis Date   Allergy 2000   Allergy history unknown    Arthritis    oa   Asthma    takes acolate for   Breast cancer ([place]) 1999; 2008   hx of bilateral breast cancer   Breast cancer, left breast ([place]) 10/27/2012   3.2 cm ER positive, Her-2 negative, 1 node positive S/P mastectomy/TTRAM reconstruction 4/08   Breast cancer, right breast ([place]) 10/27/2012   3.2 cm ER/PR positive, 1 node positive, Her-2 negative Rx w mastectomy, AC -T chemo, followed by aromatase inhibitor x 5 years dx 4/08   Cancer ([place])    breast CA   Cataracts, bilateral    CHF (congestive heart failure) (HCC)    Dehydration after exertion    after she went berry picking in the summer heat . see ED visit    Diabetes ([place])    DM2 (diabetes mellitus, type 2) ([place])    on welchol for   Dyspnea    with exertion   Elevated cholesterol    GERD (gastroesophageal reflux disease)    Hiatal hernia    Hypertension    LBBB (left bundle branch block) 10/27/2012   Leg cramps    NICM (nonischemic cardiomyopathy) ([place])    a. Echo:  06/14/12: Poor endocardial definition, possible septal HK, EF 50-55%, normal wall motion, mild LAE ;  b.  [facility] 9[facility]13:  normal cors, EF  35-40%,  c. Echo 7/14: EF 50-55%, mild LAE   Other fatigue 01/01/2015   PONV (postoperative nausea and vomiting)    Pulmonary embolus ([place]) 2001   after tram flap surgery   Past Surgical History:  Procedure Laterality Date   ATRIAL FIBRILLATION ABLATION N/A 01/06/2022   Procedure: ATRIAL FIBRILLATION ABLATION;  Surgeon: Constance Haw, MD;  Location: [facility];  Service: Cardiovascular;  Laterality: N/A;   BREAST SURGERY  2000 &2005   DILATION AND CURETTAGE OF UTERUS   87yr ago   x 2   EYE SURGERY  2020   JOINT REPLACEMENT     KNEE CLOSED REDUCTION Right 06/01/2018   Procedure: CLOSED MANIPULATION RIGHT KNEE;  Surgeon: AGaynelle Arabian MD;  [facility];  Service: Orthopedics;  Laterality: Right;   KNEE CLOSED REDUCTION Left 02/22/2019   Procedure: CLOSED MANIPULATION KNEE;  Surgeon: AGaynelle Arabian MD;  [facility];  Service: Orthopedics;  Laterality: Left;  116m   KNEE SURGERY  2006   left torn cartilage repair   LEFT HEART CATHETERIZATION WITH CORONARY ANGIOGRAM N/A 06/15/2012   Procedure: LEFT HEART CATHETERIZATION WITH CORONARY ANGIOGRAM;  Surgeon: MuWellington HampshireMD;  [facility];  Service: Cardiovascular;  Laterality: N/A;   MASTECTOMY     bilateral   RECONSTRUCTION BREAST W/ TRAM FLAP Left 2001   TOTAL KNEE ARTHROPLASTY Right 03/07/2018   Procedure: RIGHT TOTAL KNEE ARTHROPLASTY;  Surgeon: Gaynelle Arabian, MD;  [facility];  Service: Orthopedics;  Laterality: Right;   TOTAL KNEE ARTHROPLASTY Left 10/31/2018   Procedure: TOTAL KNEE ARTHROPLASTY;  Surgeon: Gaynelle Arabian, MD;  [facility];  Service: Orthopedics;  Laterality: Left;  40mn   Current Outpatient Medications on File Prior to Visit  Medication Sig Dispense Refill   ACCU-CHEK AVIVA PLUS test strip USE TO CHECK BLOOD SUGAR EVERY MORNING DX: E11.9 100 strip 3   acetaminophen (TYLENOL) 500 MG tablet Take 1,000 mg by mouth every 6 (six) hours as needed for mild pain or moderate  pain.     atorvastatin (LIPITOR) 40 MG tablet Take 1 tablet (40 mg total) by mouth daily. 90 tablet 3   bisoprolol (ZEBETA) 5 MG tablet Take 0.5 tablets (2.5 mg total) by mouth daily. 45 tablet 3   Blood Glucose Monitoring Suppl (ACCU-CHEK AVIVA PLUS) w/Device KIT Use to check FBS QAM - DX:E11.9 1 kit 1   Cholecalciferol (VITAMIN D3) 50 MCG (2000 UT) CAPS Take 2,000 Units by mouth daily.     clobetasol ointment (TEMOVATE) 04.92% 1 application. daily as needed (Rash).     Coenzyme Q10 (CO Q 10 PO) Take 400 mg by mouth daily.     colesevelam (WELCHOL) 625 MG tablet TAKE 3 TABLETS BY MOUTH TWICE A DAY WITH A MEAL 540 tablet 0   dexlansoprazole (DEXILANT) 60 MG capsule TAKE 1 CAPSULE BY MOUTH EVERY DAY 90 capsule 0   diltiazem (CARDIZEM) 30 MG tablet TAKE 1 TABLET EVERY 4 HOURS AS NEEDED FOR AFIB HEART RATE >100 45 tablet 1   ELIQUIS 5 MG TABS tablet TAKE 1 TABLET BY MOUTH TWICE A DAY 180 tablet 3   famotidine (PEPCID) 40 MG tablet TAKE 1 TABLET BY MOUTH EVERY DAY 90 tablet 1   fluticasone (FLONASE) 50 MCG/ACT nasal spray Place 2 sprays into both nostrils daily as needed for allergies or rhinitis.     Ginkgo Biloba 120 MG TABS Take 120 mg by mouth daily.     Glucosamine-Chondroitin (COSAMIN DS PO) Take 1 tablet by mouth 2 (two) times a day.     Lancets (ACCU-CHEK SOFT TOUCH) lancets Use to check BS QAM DX: E11.9 100 each 3   losartan (COZAAR) 100 MG tablet TAKE 1 TABLET BY MOUTH EVERY DAY 90 tablet 3   Magnesium 250 MG TABS Take 250 mg by mouth daily.     Multiple Vitamin (MULTIVITAMIN WITH MINERALS) TABS tablet Take 1 tablet by mouth daily. Senior Multivitamin Iron Free Formula     Omega-3 Fatty Acids (OMEGA 3 PO) Take 500 mg by mouth 2 (two) times daily.     Potassium 99 MG TABS Take 1 tablet by mouth every morning.     spironolactone (ALDACTONE) 25 MG tablet Take 1 tablet (25 mg total) by mouth daily. 90 tablet 3   vitamin B-12 (CYANOCOBALAMIN) 1000 MCG tablet Take 1,000 mcg by mouth 2 (two)  times daily.     vitamin C (ASCORBIC ACID) 500 MG tablet Take 500 mg by mouth daily. Ultra C     zafirlukast (ACCOLATE) 20 MG tablet Take 1 tablet (20 mg total) by mouth 2 (two) times daily. 180 tablet 3   No current facility-administered medications on file prior to visit.   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   Social Determinants of Health   Financial Resource Strain: Low Risk  (07/10/2022)   Overall Financial Resource Strain (CARDIA)    Difficulty of Paying Living Expenses: Not hard at all  Food Insecurity: No Food Insecurity (07/10/2022)   Hunger Vital Sign    Worried About Running Out of Food in the Last Year: Never true    Ran Out of Food in the Last Year: Never true  Transportation Needs: No Transportation Needs (07/10/2022)   PRAPARE - Hydrologist (Medical): No    Lack of Transportation (Non-Medical): No  Physical Activity: Sufficiently Active (07/10/2022)   Exercise Vital Sign    Days of Exercise per Week: 5 days    Minutes of Exercise per Session: 40 min  Stress: No Stress Concern  Present (07/10/2022)   [place]    Feeling of Stress : Not at all  Social Connections: Moderately Isolated (07/10/2022)   Social Connection and Isolation Panel [NHANES]    Frequency of Communication with Friends and Family: Twice a week    Frequency of Social Gatherings with Friends and Family: Three times a week    Attends Religious Services: Never    Active Member of Clubs or Organizations: No    Attends Archivist Meetings: Never    Marital Status: Married  Human resources officer Violence: Not At Risk (07/10/2022)   Humiliation, Afraid, Rape, and Kick questionnaire    Fear of Current or Ex-Partner: No    Emotionally Abused: No    Physically Abused: No    Sexually Abused: No      Review of Systems  All other systems reviewed and are negative.      Objective:   Physical Exam Vitals reviewed.  Constitutional:      General: She is not in acute distress.    Appearance: She is well-developed. She is not diaphoretic.  Cardiovascular:     Rate and Rhythm: Normal rate and regular rhythm.     Heart sounds: Murmur heard.     No friction rub. No gallop.  Pulmonary:     Effort: Pulmonary effort is normal. No respiratory distress.     Breath sounds: Normal breath sounds. No stridor. No wheezing or rales.  Abdominal:     General: Bowel sounds are normal. There is no distension.     Palpations: Abdomen is soft. There is no mass.  Tenderness: There is no abdominal tenderness. There is no guarding or rebound.  Musculoskeletal:     Cervical back: Neck supple.     Right lower leg: No edema.     Left lower leg: No edema.  Skin:    Coloration: Skin is not jaundiced.  Neurological:     General: No focal deficit present.     Mental Status: She is oriented to person, place, and time. Mental status is at baseline.           Assessment & Plan:  Diarrhea, unspecified type - Plan: Sedimentation rate, CBC with  Differential/Platelet, COMPLETE METABOLIC PANEL WITH GFR Based on her history, the diarrhea appears close diarrhea or perhaps even IBS.  Have asked her to stop potassium supplement and the magnesium supplement see if the diarrhea could be related to these.  I also asked her to add a fiber supplement.  I will check a CBC to evaluate for leukocytosis as well as a sedimentation rate.  If the diarrhea persist or if his sed rate is elevated or the CBC is elevated, I would recommend GI consultation for evaluation of amatory bowel disease/colitis.

## 2022-09-19 LAB — CBC WITH DIFFERENTIAL/PLATELET
Absolute Monocytes: 611 cells/uL (ref 200–950)
Basophils Absolute: 50 cells/uL (ref 0–200)
Basophils Relative: 0.8 %
Eosinophils Absolute: 170 cells/uL (ref 15–500)
Eosinophils Relative: 2.7 %
HCT: 30.2 % — ABNORMAL LOW (ref 35.0–45.0)
Hemoglobin: 10.1 g/dL — ABNORMAL LOW (ref 11.7–15.5)
Lymphs Abs: 1997 cells/uL (ref 850–3900)
MCH: 31.2 pg (ref 27.0–33.0)
MCHC: 33.4 g/dL (ref 32.0–36.0)
MCV: 93.2 fL (ref 80.0–100.0)
MPV: 9.6 fL (ref 7.5–12.5)
Monocytes Relative: 9.7 %
Neutro Abs: 3471 cells/uL (ref 1500–7800)
Neutrophils Relative %: 55.1 %
Platelets: 308 10*3/uL (ref 140–400)
RBC: 3.24 10*6/uL — ABNORMAL LOW (ref 3.80–5.10)
RDW: 12.2 % (ref 11.0–15.0)
Total Lymphocyte: 31.7 %
WBC: 6.3 10*3/uL (ref 3.8–10.8)

## 2022-09-19 LAB — COMPLETE METABOLIC PANEL WITH GFR
AG Ratio: 1.8 (calc) (ref 1.0–2.5)
ALT: 13 U/L (ref 6–29)
AST: 14 U/L (ref 10–35)
Albumin: 4.2 g/dL (ref 3.6–5.1)
Alkaline phosphatase (APISO): 92 U/L (ref 37–153)
BUN/Creatinine Ratio: 23 (calc) — ABNORMAL HIGH (ref 6–22)
BUN: 25 mg/dL (ref 7–25)
CO2: 25 mmol/L (ref 20–32)
Calcium: 9.3 mg/dL (ref 8.6–10.4)
Chloride: 105 mmol/L (ref 98–110)
Creat: 1.09 mg/dL — ABNORMAL HIGH (ref 0.60–1.00)
Globulin: 2.4 g/dL (calc) (ref 1.9–3.7)
Glucose, Bld: 93 mg/dL (ref 65–99)
Potassium: 4.6 mmol/L (ref 3.5–5.3)
Sodium: 139 mmol/L (ref 135–146)
Total Bilirubin: 0.3 mg/dL (ref 0.2–1.2)
Total Protein: 6.6 g/dL (ref 6.1–8.1)
eGFR: 52 mL/min/{1.73_m2} — ABNORMAL LOW (ref >=60)

## 2022-09-19 LAB — SEDIMENTATION RATE: Sed Rate: 28 mm/h (ref 0–30)

## 2022-09-23 ENCOUNTER — Other Ambulatory Visit (HOSPITAL_COMMUNITY): Payer: Self-pay

## 2022-10-09 ENCOUNTER — Telehealth: Payer: Self-pay

## 2022-10-09 NOTE — Telephone Encounter (Signed)
Pt called in concerned about getting her colonoscopy. Pt stated that she wasn't sure if she should just call and schedule her appt or see if her pcp wanted her to get a different type of exam scheduled due to her stomach issues. Pt just wanted to know what she should do. Please advise.  Cb#: [phone]

## 2022-10-12 ENCOUNTER — Ambulatory Visit (HOSPITAL_COMMUNITY)
Admission: RE | Admit: 2022-10-12 | Discharge: 2022-10-12 | Disposition: A | Discharge status: Home / Self Care | Payer: Medicare HMO | Source: Ambulatory Visit | Attending: Physician Assistant | Admitting: Physician Assistant

## 2022-10-12 ENCOUNTER — Encounter (HOSPITAL_COMMUNITY): Payer: Self-pay | Admitting: Physician Assistant

## 2022-10-12 VITALS — BP 114/60 | HR 85 | Ht 62.0 in | Wt 177.0 lb

## 2022-10-12 DIAGNOSIS — I428 Other cardiomyopathies: Secondary | ICD-10-CM | POA: Insufficient documentation

## 2022-10-12 DIAGNOSIS — D6869 Other thrombophilia: Secondary | ICD-10-CM

## 2022-10-12 DIAGNOSIS — I5022 Chronic systolic (congestive) heart failure: Secondary | ICD-10-CM | POA: Diagnosis not present

## 2022-10-12 DIAGNOSIS — Z79899 Other long term (current) drug therapy: Secondary | ICD-10-CM | POA: Diagnosis not present

## 2022-10-12 DIAGNOSIS — Z7901 Long term (current) use of anticoagulants: Secondary | ICD-10-CM | POA: Diagnosis not present

## 2022-10-12 DIAGNOSIS — E785 Hyperlipidemia, unspecified: Secondary | ICD-10-CM | POA: Insufficient documentation

## 2022-10-12 DIAGNOSIS — I480 Paroxysmal atrial fibrillation: Secondary | ICD-10-CM | POA: Insufficient documentation

## 2022-10-12 DIAGNOSIS — I110 Hypertensive heart disease with heart failure: Secondary | ICD-10-CM | POA: Insufficient documentation

## 2022-10-12 NOTE — Progress Notes (Signed)
Primary Care Physician: Susy Frizzle, MD Primary Cardiologist: Dr Aundra Dubin Primary Electrophysiologist: Dr Ileana Ladd Referring Physician: Dr Milon Score [name] is a 77 y.o. female with a history of HTN, DM, systolic CHF, asthma, HLD, and paroxysmal atrial fibrillation who presents for follow up in the [facility].  The patient was initially diagnosed with atrial fibrillation 12/2014 after presenting with symptoms of increased dyspnea. Her Jodelle Red showed atrial fibrillation. Patient is on Eliquis for a CHADS2VASC score of 6. On 12/21/19, patient reported that she was "feeling funny" and checked her Jodelle Red which showed rapid afib. She took an extra dose of Coreg but her rapid rates persistent and she presented to the ER. She spontaneously converted in the ER. There were no specific triggers that she could identify. She denies alcohol use. She does have snoring and daytime somnolence. Patient is s/p afib ablation with Dr Curt Bears on 01/06/22.   On follow up today, patient reports that she has done well from a cardiac standpoint. She checks her Jodelle Red mobile frequently and it has shown only SR. She denies any bleeding issues on anticoagulation.   Today, she denies symptoms of palpitations, orthopnea, PND, lower extremity edema, dizziness, presyncope, syncope, bleeding, or neurologic sequela. The patient is tolerating medications without difficulties and is otherwise without complaint today.    Atrial Fibrillation Risk Factors:  she does have symptoms or diagnosis of sleep apnea. Declined sleep study. she does not have a history of rheumatic fever. she does not have a history of alcohol use. The patient does not have a history of early familial atrial fibrillation or other arrhythmias.  she has a BMI of Body mass index is 32.37 kg/m.Marland Kitchen Filed Weights   10/12/22 1037  Weight: 80.3 kg    Family History  Problem Relation Age of Onset   Asthma Maternal Grandmother     Breast cancer Maternal Grandmother        dx. 51s   Stomach cancer Maternal Grandmother 60       lots of stomach problems   Heart attack Paternal Grandfather    Other Daughter        cyst on ovary; unilateral oophorectomy   Heart attack Brother    Cancer Maternal Aunt        unknown type   Celiac disease Maternal Aunt    Diabetes Maternal Uncle    Colon cancer Cousin        dx. 50s-60s   Colon cancer Paternal Aunt        dx. late 12s   Lung cancer Paternal Aunt        dx. late 60s-70s; former smoker   Heart attack Paternal Aunt    Heart attack Paternal Uncle      Atrial Fibrillation Management history:  Previous antiarrhythmic drugs: Multaq Previous cardioversions: none Previous ablations: 01/06/22 CHADS2VASC score: 6 Anticoagulation history: Eliquis   Past Medical History:  Diagnosis Date   Allergy 2000   Allergy history unknown    Arthritis    oa   Asthma    takes acolate for   Breast cancer ([place]) 1999; 2008   hx of bilateral breast cancer   Breast cancer, left breast ([place]) 10/27/2012   3.2 cm ER positive, Her-2 negative, 1 node positive S/P mastectomy/TTRAM reconstruction 4/08   Breast cancer, right breast ([place]) 10/27/2012   3.2 cm ER/PR positive, 1 node positive, Her-2 negative Rx w mastectomy, AC -T chemo, followed by aromatase inhibitor x 5 years  dx 4/08   Cancer ([place])    breast CA   Cataracts, bilateral    CHF (congestive heart failure) ([place])    Dehydration after exertion    after she went berry picking in the summer heat . see ED visit    Diabetes ([place])    DM2 (diabetes mellitus, type 2) ([place])    on welchol for   Dyspnea    with exertion   Elevated cholesterol    GERD (gastroesophageal reflux disease)    Hiatal hernia    Hypertension    LBBB (left bundle branch block) 10/27/2012   Leg cramps    NICM (nonischemic cardiomyopathy) ([place])    a. Echo:  06/14/12: Poor endocardial definition, possible septal HK, EF 50-55%, normal wall motion, mild LAE ;   b.  [facility] 9/13:  normal cors, EF 35-40%,  c. Echo 7/14: EF 50-55%, mild LAE   Other fatigue 01/01/2015   PONV (postoperative nausea and vomiting)    Pulmonary embolus ([place]) 2001   after tram flap surgery   Past Surgical History:  Procedure Laterality Date   ATRIAL FIBRILLATION ABLATION N/A 01/06/2022   Procedure: ATRIAL FIBRILLATION ABLATION;  Surgeon: Constance Haw, MD;  Location: [facility];  Service: Cardiovascular;  Laterality: N/A;   BREAST SURGERY  2000 &2005   DILATION AND CURETTAGE OF UTERUS   15yr ago   x 2   EYE SURGERY  2020   JOINT REPLACEMENT     KNEE CLOSED REDUCTION Right 06/01/2018   Procedure: CLOSED MANIPULATION RIGHT KNEE;  Surgeon: AGaynelle Arabian MD;  Location: [facility];  Service: Orthopedics;  Laterality: Right;   KNEE CLOSED REDUCTION Left 02/22/2019   Procedure: CLOSED MANIPULATION KNEE;  Surgeon: AGaynelle Arabian MD;  Location: [facility];  Service: Orthopedics;  Laterality: Left;  125m   KNEE SURGERY  2006   left torn cartilage repair   LEFT HEART CATHETERIZATION WITH CORONARY ANGIOGRAM N/A 06/15/2012   Procedure: LEFT HEART CATHETERIZATION WITH CORONARY ANGIOGRAM;  Surgeon: MuWellington HampshireMD;  [facility];  Service: Cardiovascular;  Laterality: N/A;   MASTECTOMY     bilateral   RECONSTRUCTION BREAST W/ TRAM FLAP Left 2001   TOTAL KNEE ARTHROPLASTY Right 03/07/2018   Procedure: RIGHT TOTAL KNEE ARTHROPLASTY;  Surgeon: AlGaynelle ArabianMD;  Location: [facility];  Service: Orthopedics;  Laterality: Right;   TOTAL KNEE ARTHROPLASTY Left 10/31/2018   Procedure: TOTAL KNEE ARTHROPLASTY;  Surgeon: AlGaynelle ArabianMD;  Location: [facility];  Service: Orthopedics;  Laterality: Left;  503m   Current Outpatient Medications:    ACCU-CHEK AVIVA PLUS test strip, USE TO CHECK BLOOD SUGAR EVERY MORNING DX: E11.9, Disp: 100 strip, Rfl: 3   acetaminophen (TYLENOL) 500 MG tablet, Take 1,000 mg by mouth every 6 (six) hours as needed for mild pain or moderate  pain., Disp: , Rfl:    atorvastatin (LIPITOR) 40 MG tablet, Take 1 tablet (40 mg total) by mouth daily., Disp: 90 tablet, Rfl: 3   bisoprolol (ZEBETA) 5 MG tablet, Take 0.5 tablets (2.5 mg total) by mouth daily., Disp: 45 tablet, Rfl: 3   Blood Glucose Monitoring Suppl (ACCU-CHEK AVIVA PLUS) w/Device KIT, Use to check FBS QAM - DX:E11.9, Disp: 1 kit, Rfl: 1   Cholecalciferol (VITAMIN D3) 50 MCG (2000 UT) CAPS, Take 2,000 Units by mouth daily., Disp: , Rfl:    clobetasol ointment (TEMOVATE) 0.03.26 1 application. daily as needed (Rash)., Disp: , Rfl:    Coenzyme Q10 (CO Q  10 PO), Take 400 mg by mouth daily., Disp: , Rfl:    colesevelam (WELCHOL) 625 MG tablet, TAKE 3 TABLETS BY MOUTH TWICE A DAY WITH A MEAL, Disp: 540 tablet, Rfl: 0   dexlansoprazole (DEXILANT) 60 MG capsule, TAKE 1 CAPSULE BY MOUTH EVERY DAY, Disp: 90 capsule, Rfl: 0   diltiazem (CARDIZEM) 30 MG tablet, TAKE 1 TABLET EVERY 4 HOURS AS NEEDED FOR AFIB HEART RATE >100, Disp: 45 tablet, Rfl: 1   ELIQUIS 5 MG TABS tablet, TAKE 1 TABLET BY MOUTH TWICE A DAY, Disp: 180 tablet, Rfl: 3   famotidine (PEPCID) 40 MG tablet, TAKE 1 TABLET BY MOUTH EVERY DAY, Disp: 90 tablet, Rfl: 1   fluticasone (FLONASE) 50 MCG/ACT nasal spray, Place 2 sprays into both nostrils daily as needed for allergies or rhinitis., Disp: , Rfl:    Ginkgo Biloba 120 MG TABS, Take 120 mg by mouth daily., Disp: , Rfl:    Glucosamine-Chondroitin (COSAMIN DS PO), Take 1 tablet by mouth 2 (two) times a day., Disp: , Rfl:    Lancets (ACCU-CHEK SOFT TOUCH) lancets, Use to check BS QAM DX: E11.9, Disp: 100 each, Rfl: 3   losartan (COZAAR) 100 MG tablet, TAKE 1 TABLET BY MOUTH EVERY DAY, Disp: 90 tablet, Rfl: 3   Multiple Vitamin (MULTIVITAMIN WITH MINERALS) TABS tablet, Take 1 tablet by mouth daily. Senior Multivitamin Iron Free Formula, Disp: , Rfl:    Omega-3 Fatty Acids (OMEGA 3 PO), Take 500 mg by mouth 2 (two) times daily., Disp: , Rfl:    spironolactone (ALDACTONE) 25 MG  tablet, Take 1 tablet (25 mg total) by mouth daily., Disp: 90 tablet, Rfl: 3   vitamin B-12 (CYANOCOBALAMIN) 1000 MCG tablet, Take 1,000 mcg by mouth 2 (two) times daily., Disp: , Rfl:    vitamin C (ASCORBIC ACID) 500 MG tablet, Take 500 mg by mouth daily. Ultra C, Disp: , Rfl:    zafirlukast (ACCOLATE) 20 MG tablet, Take 1 tablet (20 mg total) by mouth 2 (two) times daily., Disp: 180 tablet, Rfl: 3    Allergies  Allergen Reactions   Adhesive [Tape] Other (See Comments)    Skin turns red    Diphenhydramine Other (See Comments)    Pt feels wired, hyperactive   Vicodin [Hydrocodone-Acetaminophen] Nausea And Vomiting    Social History   Socioeconomic History   Marital status: Married    Spouse name: Dwight   Number of children: 2   Years of education: Not on file   Highest education level: Not on file  Occupational History   Occupation: retired    Fish farm manager: RETIRED    Comment: post Furniture conservator/restorer  Tobacco Use   Smoking status: Never   Smokeless tobacco: Never   Tobacco comments:    Never smoke 01/27/22  Vaping Use   Vaping Use: Never used  Substance and Sexual Activity   Alcohol use: Not Currently   Drug use: Never   Sexual activity: Not Currently    Birth control/protection: None  Other Topics Concern   Not on file  Social History Narrative   1 son and 1 daughter.   Social Determinants of Health   Financial Resource Strain: Low Risk  (07/10/2022)   Overall Financial Resource Strain (CARDIA)    Difficulty of Paying Living Expenses: Not hard at all  Food Insecurity: No Food Insecurity (07/10/2022)   Hunger Vital Sign    Worried About Running Out of Food in the Last Year: Never true    Ran Out of  Food in the Last Year: Never true  Transportation Needs: No Transportation Needs (07/10/2022)   PRAPARE - Hydrologist (Medical): No    Lack of Transportation (Non-Medical): No  Physical Activity: Sufficiently Active (07/10/2022)   Exercise  Vital Sign    Days of Exercise per Week: 5 days    Minutes of Exercise per Session: 40 min  Stress: No Stress Concern Present (07/10/2022)   [place]    Feeling of Stress : Not at all  Social Connections: Moderately Isolated (07/10/2022)   Social Connection and Isolation Panel [NHANES]    Frequency of Communication with Friends and Family: Twice a week    Frequency of Social Gatherings with Friends and Family: Three times a week    Attends Religious Services: Never    Active Member of Clubs or Organizations: No    Attends Archivist Meetings: Never    Marital Status: Married  Human resources officer Violence: Not At Risk (07/10/2022)   Humiliation, Afraid, Rape, and Kick questionnaire    Fear of Current or Ex-Partner: No    Emotionally Abused: No    Physically Abused: No    Sexually Abused: No     ROS- All systems are reviewed and negative except as per the HPI above.  Physical Exam: Vitals:   10/12/22 1037  BP: 114/60  Pulse: 85  Weight: 80.3 kg  Height: '5\' 2"'$  (1.575 m)    GEN- The patient is a well appearing elderly female, alert and oriented x 3 today.   HEENT-head normocephalic, atraumatic, sclera clear, conjunctiva pink, hearing intact, trachea midline. Lungs- Clear to ausculation bilaterally, normal work of breathing Heart- Regular rate and rhythm, no murmurs, rubs or gallops  GI- soft, NT, ND, + BS Extremities- no clubbing, cyanosis, or edema MS- no significant deformity or atrophy Skin- no rash or lesion Psych- euthymic mood, full affect Neuro- strength and sensation are intact   Wt Readings from Last 3 Encounters:  10/12/22 80.3 kg  09/18/22 79.8 kg  08/10/22 80.3 kg    EKG today demonstrates  SR, LBBB Vent. rate 85 BPM PR interval 200 ms QRS duration 154 ms QT/QTcB 398/473 ms  Echo 02/10/22 demonstrated   1. Left ventricular ejection fraction, by estimation, is 55 to 60%. The  left  ventricle has normal function. Abnormal (paradoxical) septal motion, consistent with left bundle branch block. Left ventricular diastolic parameters are indeterminate. There is mild left ventricular hypertrophy. Left ventricular diastolic parameters are indeterminate.   2. Right ventricular systolic function is normal. The right ventricular  size is normal. There is normal pulmonary artery systolic pressure. The  estimated right ventricular systolic pressure is 02.4 mmHg.   3. The mitral valve is normal in structure. No evidence of mitral valve  regurgitation. No evidence of mitral stenosis.   4. The aortic valve is tricuspid. Aortic valve regurgitation is not  visualized. No aortic stenosis is present.   5. The inferior vena cava is normal in size with greater than 50%  respiratory variability, suggesting right atrial pressure of 3 mmHg.   Epic records are reviewed at length today  CHA2DS2-VASc Score = 6  The patient's score is based upon: CHF History: 1 HTN History: 1 Diabetes History: 1 Stroke History: 0 Vascular Disease History: 0 Age Score: 2 Gender Score: 1        ASSESSMENT AND PLAN: 1. Paroxysmal Atrial Fibrillation (ICD10:  I48.0) The patient's CHA2DS2-VASc score is  6, indicating a 9.7% annual risk of stroke.   Failed Multaq due to side effects S/p afib ablation 01/06/22 Patient appears to be maintaining SR. Continue diltiazem 30 mg PRN q 4 hours for heart racing. Kardia for home monitoring.   Continue Eliquis 5 mg BID  Continue bisoprolol 2.5 mg daily  2. Secondary Hypercoagulable State (ICD10:  D68.69) The patient is at significant risk for stroke/thromboembolism based upon her CHA2DS2-VASc Score of 6.  Continue Apixaban (Eliquis).   3. HTN Stable, no changes today.  4. NICM EF 55-60% Fluid status appears stable.    Follow up in the AF clinic in 6 months.    [facility] [address] [place], [place]  [address] [phone] 10/12/2022 10:58 AM

## 2022-10-14 ENCOUNTER — Other Ambulatory Visit: Payer: Self-pay | Admitting: Family Medicine

## 2022-10-14 ENCOUNTER — Other Ambulatory Visit (HOSPITAL_COMMUNITY): Payer: Self-pay | Admitting: Cardiology

## 2022-10-14 NOTE — Telephone Encounter (Signed)
Requested Prescriptions  Pending Prescriptions Disp Refills   dexlansoprazole (DEXILANT) 60 MG capsule [Pharmacy Med Name: DEXLANSOPRAZOLE DR 60 MG CAP] 90 capsule 0    Sig: TAKE 1 CAPSULE BY MOUTH EVERY DAY     Gastroenterology: Proton Pump Inhibitors Passed - 10/14/2022 10:41 AM      Passed - Valid encounter within last 12 months    Recent Outpatient Visits           7 months ago Right leg pain   [place] Dennard Schaumann, Cammie Mcgee, MD   7 months ago Right sided sciatica   [place] Dennard Schaumann, Cammie Mcgee, MD   8 months ago Shortness of breath   [place] Susy Frizzle, MD   1 year ago Controlled type 2 diabetes mellitus with complication, without long-term current use of insulin ([place])   [place] Susy Frizzle, MD   2 years ago Controlled type 2 diabetes mellitus with complication, without long-term current use of insulin ([place])   [place] Pickard, Cammie Mcgee, MD               colesevelam [facility]) 625 MG tablet [Pharmacy Med Name: COLESEVELAM 625 MG TABLET] 540 tablet 0    Sig: TAKE 3 TABLETS BY MOUTH TWICE A DAY WITH A MEAL     Cardiovascular:  Antilipid - Bile Acid Sequestrants Failed - 10/14/2022 10:41 AM      Failed - Lipid Panel in normal range within the last 12 months    Cholesterol  Date Value Ref Range Status  07/13/2022 104 0 - 200 mg/dL Final   LDL Cholesterol (Calc)  Date Value Ref Range Status  05/23/2020 42 mg/dL (calc) Final    Comment:    Reference range: <100 . Desirable range <100 mg/dL for primary prevention;   <70 mg/dL for patients with CHD or diabetic patients  with > or = 2 CHD risk factors. Marland Kitchen LDL-C is now calculated using the Martin-Hopkins  calculation, which is a validated novel method providing  better accuracy than the Friedewald equation in the  estimation of LDL-C.  Cresenciano Genre et al. Annamaria Helling. 5053;976(73): 2061-2068   (http://education.[facility].com/faq/FAQ164)    LDL Cholesterol  Date Value Ref Range Status  07/13/2022 47 0 - 99 mg/dL Final    Comment:           Total Cholesterol/HDL:CHD Risk Coronary Heart Disease Risk Table                     Men   Women  1/2 Average Risk   3.4   3.3  Average Risk       5.0   4.4  2 X Average Risk   9.6   7.1  3 X Average Risk  23.4   11.0        Use the calculated Patient Ratio above and the CHD Risk Table to determine the patient's CHD Risk.        ATP III CLASSIFICATION (LDL):  <100     mg/dL   Optimal  100-129  mg/dL   Near or Above                    Optimal  130-159  mg/dL   Borderline  160-189  mg/dL   High  >190     mg/dL   Very High Performed at [facility] [address], [place], [place] [address]  HDL  Date Value Ref Range Status  07/13/2022 38 (L) >40 mg/dL Final   Triglycerides  Date Value Ref Range Status  07/13/2022 93 <150 mg/dL Final         Passed - Valid encounter within last 12 months    Recent Outpatient Visits           7 months ago Right leg pain   [place] Dennard Schaumann, Cammie Mcgee, MD   7 months ago Right sided sciatica   [place] Dennard Schaumann, Cammie Mcgee, MD   8 months ago Shortness of breath   [place] Susy Frizzle, MD   1 year ago Controlled type 2 diabetes mellitus with complication, without long-term current use of insulin ([place])   [place] Susy Frizzle, MD   2 years ago Controlled type 2 diabetes mellitus with complication, without long-term current use of insulin ([place])   [facility] Pickard, Cammie Mcgee, MD

## 2022-10-27 ENCOUNTER — Ambulatory Visit (INDEPENDENT_AMBULATORY_CARE_PROVIDER_SITE_OTHER): Payer: Medicare HMO | Admitting: Family Medicine

## 2022-10-27 ENCOUNTER — Encounter: Payer: Self-pay | Admitting: Family Medicine

## 2022-10-27 ENCOUNTER — Ambulatory Visit
Admission: RE | Admit: 2022-10-27 | Discharge: 2022-10-27 | Disposition: A | Discharge status: Home / Self Care | Payer: Medicare HMO | Source: Ambulatory Visit | Attending: Family Medicine | Admitting: Family Medicine

## 2022-10-27 VITALS — BP 116/62 | HR 80 | Temp 97.9°F | Ht 62.0 in | Wt 174.4 lb

## 2022-10-27 DIAGNOSIS — M79604 Pain in right leg: Secondary | ICD-10-CM

## 2022-10-27 DIAGNOSIS — M2550 Pain in unspecified joint: Secondary | ICD-10-CM

## 2022-10-27 NOTE — Progress Notes (Signed)
Subjective:    Patient ID: [name], female    DOB: 1946-08-29, 77 y.o.   MRN: [id number]  HPI  Patient presents today complaining of several months of pain in her right leg.  The pain seems to be moving.  The majority the time is located on her right gluteus.  At other times will radiate down her right leg.  Sometimes it will hurt in her anterior right hip.  At other times it will hurt in her right leg and her knee and in her thigh.  She describes it as a severe pain is made worse by lifting heavy objects.  She also reports the pain worsens when she does yard work.  She saw her orthopedist who did a "MRI" of her hip and said everything was normal and her hip.  She denies any numbness or tingling in her groin.  She complains about pain in all the joints of her right leg including her feet her ankles or knees and her hip. Past Medical History:  Diagnosis Date   Allergy 2000   Allergy history unknown    Arthritis    oa   Asthma    takes acolate for   Breast cancer ([place]) 1999; 2008   hx of bilateral breast cancer   Breast cancer, left breast ([place]) 10/27/2012   3.2 cm ER positive, Her-2 negative, 1 node positive S/P mastectomy/TTRAM reconstruction 4/08   Breast cancer, right breast ([place]) 10/27/2012   3.2 cm ER/PR positive, 1 node positive, Her-2 negative Rx w mastectomy, AC -T chemo, followed by aromatase inhibitor x 5 years dx 4/08   Cancer ([place])    breast CA   Cataracts, bilateral    CHF (congestive heart failure) (HCC)    Dehydration after exertion    after she went berry picking in the summer heat . see ED visit    Diabetes ([place])    DM2 (diabetes mellitus, type 2) ([place])    on welchol for   Dyspnea    with exertion   Elevated cholesterol    GERD (gastroesophageal reflux disease)    Hiatal hernia    Hypertension    LBBB (left bundle branch block) 10/27/2012   Leg cramps    NICM (nonischemic cardiomyopathy) ([place])    a. Echo:  06/14/12: Poor endocardial definition, possible  septal HK, EF 50-55%, normal wall motion, mild LAE ;  b.  [facility] 9/13:  normal cors, EF 35-40%,  c. Echo 7/14: EF 50-55%, mild LAE   Other fatigue 01/01/2015   PONV (postoperative nausea and vomiting)    Pulmonary embolus ([place]) 2001   after tram flap surgery   Past Surgical History:  Procedure Laterality Date   ATRIAL FIBRILLATION ABLATION N/A 01/06/2022   Procedure: ATRIAL FIBRILLATION ABLATION;  Surgeon: Constance Haw, MD;  Location: [facility];  Service: Cardiovascular;  Laterality: N/A;   BREAST SURGERY  2000 &2005   DILATION AND CURETTAGE OF UTERUS   40yr ago   x 2   EYE SURGERY  2020   JOINT REPLACEMENT     KNEE CLOSED REDUCTION Right 06/01/2018   Procedure: CLOSED MANIPULATION RIGHT KNEE;  Surgeon: AGaynelle Arabian MD;  [facility];  Service: Orthopedics;  Laterality: Right;   KNEE CLOSED REDUCTION Left 02/22/2019   Procedure: CLOSED MANIPULATION KNEE;  Surgeon: AGaynelle Arabian MD;  [facility]: [facility];  Service: Orthopedics;  Laterality: Left;  172m   KNEE SURGERY  2006   left torn cartilage repair  LEFT HEART CATHETERIZATION WITH CORONARY ANGIOGRAM N/A 06/15/2012   Procedure: LEFT HEART CATHETERIZATION WITH CORONARY ANGIOGRAM;  Surgeon: Wellington Hampshire, MD;  Location: [facility];  Service: Cardiovascular;  Laterality: N/A;   MASTECTOMY     bilateral   RECONSTRUCTION BREAST W/ TRAM FLAP Left 2001   TOTAL KNEE ARTHROPLASTY Right 03/07/2018   Procedure: RIGHT TOTAL KNEE ARTHROPLASTY;  Surgeon: Gaynelle Arabian, MD;  [facility];  Service: Orthopedics;  Laterality: Right;   TOTAL KNEE ARTHROPLASTY Left 10/31/2018   Procedure: TOTAL KNEE ARTHROPLASTY;  Surgeon: Gaynelle Arabian, MD;  [facility];  Service: Orthopedics;  Laterality: Left;  73mn   Current Outpatient Medications on File Prior to Visit  Medication Sig Dispense Refill   ACCU-CHEK AVIVA PLUS test strip USE TO CHECK BLOOD SUGAR EVERY MORNING DX: E11.9 100 strip 3   acetaminophen (TYLENOL) 500 MG  tablet Take 1,000 mg by mouth every 6 (six) hours as needed for mild pain or moderate pain.     atorvastatin (LIPITOR) 40 MG tablet Take 1 tablet (40 mg total) by mouth daily. 90 tablet 3   bisoprolol (ZEBETA) 5 MG tablet Take 0.5 tablets (2.5 mg total) by mouth daily. 45 tablet 3   Blood Glucose Monitoring Suppl (ACCU-CHEK AVIVA PLUS) w/Device KIT Use to check FBS QAM - DX:E11.9 1 kit 1   Cholecalciferol (VITAMIN D3) 50 MCG (2000 UT) CAPS Take 2,000 Units by mouth daily.     clobetasol ointment (TEMOVATE) 00.97% 1 application. daily as needed (Rash).     Coenzyme Q10 (CO Q 10 PO) Take 400 mg by mouth daily.     colesevelam (WELCHOL) 625 MG tablet TAKE 3 TABLETS BY MOUTH TWICE A DAY WITH A MEAL 540 tablet 0   dexlansoprazole (DEXILANT) 60 MG capsule TAKE 1 CAPSULE BY MOUTH EVERY DAY 90 capsule 0   diltiazem (CARDIZEM) 30 MG tablet TAKE 1 TABLET EVERY 4 HOURS AS NEEDED FOR AFIB HEART RATE >100 45 tablet 1   ELIQUIS 5 MG TABS tablet TAKE 1 TABLET BY MOUTH TWICE A DAY 180 tablet 3   famotidine (PEPCID) 40 MG tablet TAKE 1 TABLET BY MOUTH EVERY DAY 90 tablet 1   fluticasone (FLONASE) 50 MCG/ACT nasal spray Place 2 sprays into both nostrils daily as needed for allergies or rhinitis.     Ginkgo Biloba 120 MG TABS Take 120 mg by mouth daily.     Glucosamine-Chondroitin (COSAMIN DS PO) Take 1 tablet by mouth 2 (two) times a day.     Lancets (ACCU-CHEK SOFT TOUCH) lancets Use to check BS QAM DX: E11.9 100 each 3   losartan (COZAAR) 100 MG tablet TAKE 1 TABLET BY MOUTH EVERY DAY 90 tablet 3   Multiple Vitamin (MULTIVITAMIN WITH MINERALS) TABS tablet Take 1 tablet by mouth daily. Senior Multivitamin Iron Free Formula     Omega-3 Fatty Acids (OMEGA 3 PO) Take 500 mg by mouth 2 (two) times daily.     spironolactone (ALDACTONE) 25 MG tablet Take 1 tablet (25 mg total) by mouth daily. 90 tablet 3   vitamin B-12 (CYANOCOBALAMIN) 1000 MCG tablet Take 1,000 mcg by mouth 2 (two) times daily.     vitamin C  (ASCORBIC ACID) 500 MG tablet Take 500 mg by mouth daily. Ultra C     zafirlukast (ACCOLATE) 20 MG tablet Take 1 tablet (20 mg total) by mouth 2 (two) times daily. 180 tablet 3   No current facility-administered medications on file prior to visit.   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   Social Determinants of Health   Financial Resource Strain: Low Risk  (07/10/2022)   Overall Financial Resource Strain (CARDIA)    Difficulty of Paying Living Expenses: Not hard at all  Food Insecurity: No Food Insecurity (07/10/2022)   Hunger Vital Sign    Worried About Running Out of Food in the Last Year: Never true    Ran Out of Food in the Last Year: Never true  Transportation Needs: No Transportation Needs (07/10/2022)   PRAPARE - Hydrologist (Medical): No    Lack of Transportation (Non-Medical): No  Physical Activity: Sufficiently Active (07/10/2022)   Exercise Vital Sign    Days of Exercise per Week: 5 days    Minutes of Exercise per Session: 40 min  Stress: No Stress Concern Present (07/10/2022)   [place]    Feeling of Stress : Not at all  Social Connections: Moderately Isolated (07/10/2022)   Social Connection and Isolation Panel [NHANES]    Frequency of Communication with Friends and Family: Twice a week    Frequency of Social Gatherings with Friends and Family: Three times a week    Attends Religious Services: Never    Active Member of Clubs or Organizations: No    Attends Archivist Meetings: Never    Marital Status: Married  Human resources officer Violence: Not At Risk (07/10/2022)   Humiliation, Afraid, Rape, and Kick questionnaire    Fear of Current or Ex-Partner: No    Emotionally Abused: No    Physically Abused: No    Sexually Abused: No      Review of Systems  All other systems reviewed and are negative.      Objective:   Physical Exam Vitals reviewed.  Constitutional:      General: She is not in acute distress.    Appearance: She is well-developed. She is not diaphoretic.  Cardiovascular:     Rate and Rhythm: Normal rate and regular rhythm.     Heart sounds: Murmur heard.     No friction rub. No gallop.  Pulmonary:     Effort: Pulmonary effort is normal. No respiratory distress.     Breath sounds: Normal breath sounds. No stridor. No wheezing or rales.  Abdominal:     General: Bowel sounds are normal. There is no distension.     Palpations: Abdomen is soft. There is no mass.     Tenderness:  There is no abdominal tenderness. There is no guarding or rebound.  Musculoskeletal:     Cervical back: Neck supple.     Right lower leg: No edema.     Left lower leg: No edema.  Skin:    Coloration: Skin is not jaundiced.  Neurological:     General: No focal deficit present.     Mental Status: She is oriented to person, place, and time. Mental status is at baseline.     Patient has normal range of motion in the right hip.  She has a negative straight leg raise.  Muscle strength is 5/5 equal and  symmetric in both legs.  She denies any numbness or tingling in the right leg      Assessment & Plan:  Polyarthralgia - Plan: CBC with Differential/Platelet, COMPLETE METABOLIC PANEL WITH GFR, Sedimentation rate, Rheumatoid factor, ANA, DG Lumbar Spine Complete I am uncertain of the cause.  I suspect right-sided sciatica with lumbar radiculopathy.  Begin by obtaining a CBC, sed rate, and ANA to evaluate for any autoimmune diseases.  Also check a lumbar spine x-ray to evaluate for any possible locations of nerve impingement.

## 2022-10-28 LAB — COMPLETE METABOLIC PANEL WITH GFR
AG Ratio: 1.6 (calc) (ref 1.0–2.5)
ALT: 66 U/L — ABNORMAL HIGH (ref 6–29)
AST: 46 U/L — ABNORMAL HIGH (ref 10–35)
Albumin: 4.1 g/dL (ref 3.6–5.1)
Alkaline phosphatase (APISO): 89 U/L (ref 37–153)
BUN: 20 mg/dL (ref 7–25)
CO2: 24 mmol/L (ref 20–32)
Calcium: 8.9 mg/dL (ref 8.6–10.4)
Chloride: 107 mmol/L (ref 98–110)
Creat: 0.96 mg/dL (ref 0.60–1.00)
Globulin: 2.5 g/dL (calc) (ref 1.9–3.7)
Glucose, Bld: 106 mg/dL — ABNORMAL HIGH (ref 65–99)
Potassium: 4.4 mmol/L (ref 3.5–5.3)
Sodium: 139 mmol/L (ref 135–146)
Total Bilirubin: 0.2 mg/dL (ref 0.2–1.2)
Total Protein: 6.6 g/dL (ref 6.1–8.1)
eGFR: 61 mL/min/{1.73_m2} (ref >=60)

## 2022-10-28 LAB — CBC WITH DIFFERENTIAL/PLATELET
Absolute Monocytes: 602 cells/uL (ref 200–950)
Basophils Absolute: 19 cells/uL (ref 0–200)
Basophils Relative: 0.4 %
Eosinophils Absolute: 80 cells/uL (ref 15–500)
Eosinophils Relative: 1.7 %
HCT: 30.1 % — ABNORMAL LOW (ref 35.0–45.0)
Hemoglobin: 10.1 g/dL — ABNORMAL LOW (ref 11.7–15.5)
Lymphs Abs: 1368 cells/uL (ref 850–3900)
MCH: 30.8 pg (ref 27.0–33.0)
MCHC: 33.6 g/dL (ref 32.0–36.0)
MCV: 91.8 fL (ref 80.0–100.0)
MPV: 9.6 fL (ref 7.5–12.5)
Monocytes Relative: 12.8 %
Neutro Abs: 2632 cells/uL (ref 1500–7800)
Neutrophils Relative %: 56 %
Platelets: 299 10*3/uL (ref 140–400)
RBC: 3.28 10*6/uL — ABNORMAL LOW (ref 3.80–5.10)
RDW: 12.5 % (ref 11.0–15.0)
Total Lymphocyte: 29.1 %
WBC: 4.7 10*3/uL (ref 3.8–10.8)

## 2022-10-28 LAB — ANA: Anti Nuclear Antibody (ANA): NEGATIVE

## 2022-10-28 LAB — RHEUMATOID FACTOR: Rheumatoid fact SerPl-aCnc: <14 IU/mL (ref <14)

## 2022-10-28 LAB — SEDIMENTATION RATE: Sed Rate: 33 mm/h — ABNORMAL HIGH (ref 0–30)

## 2022-10-29 ENCOUNTER — Other Ambulatory Visit: Payer: Self-pay

## 2022-10-29 ENCOUNTER — Telehealth: Payer: Self-pay

## 2022-10-29 DIAGNOSIS — R748 Abnormal levels of other serum enzymes: Secondary | ICD-10-CM

## 2022-10-29 NOTE — Telephone Encounter (Signed)
FYI  Pt stated that she has not been feeling good since Monday.  Pt said that she feels like there is pressure in her chest, pt went to the fire station yesterday and the EMT did an EKG and check her vitals. Per pt, everything looks ok per EMT. Per pt EMT did tell pt that it wouldn't hurt to go ER.   I also told pt that if she felt pressure in her chest and she is not feeling good for her to go to the ER as well.  Per pt voiced understanding.

## 2022-11-11 ENCOUNTER — Telehealth: Payer: Self-pay | Admitting: Family Medicine

## 2022-11-11 NOTE — Telephone Encounter (Signed)
Patient returned missed call; stated she's been waiting for a call to schedule an imaging appointment from a referral she received from Dr. Dennard Schaumann. Patient is unsure of who called her. Requesting a call back for scheduling.   Please advise at [phone], or [phone].

## 2022-11-13 ENCOUNTER — Other Ambulatory Visit: Payer: Self-pay

## 2022-11-13 DIAGNOSIS — M5136 Other intervertebral disc degeneration, lumbar region: Secondary | ICD-10-CM

## 2022-11-13 DIAGNOSIS — M5431 Sciatica, right side: Secondary | ICD-10-CM

## 2022-11-13 DIAGNOSIS — M79604 Pain in right leg: Secondary | ICD-10-CM

## 2022-11-18 ENCOUNTER — Ambulatory Visit (HOSPITAL_COMMUNITY)
Admission: RE | Admit: 2022-11-18 | Discharge: 2022-11-18 | Disposition: A | Discharge status: Home / Self Care | Payer: Medicare HMO | Source: Ambulatory Visit | Attending: Family Medicine | Admitting: Family Medicine

## 2022-11-18 DIAGNOSIS — R748 Abnormal levels of other serum enzymes: Secondary | ICD-10-CM | POA: Diagnosis present

## 2022-11-19 NOTE — Telephone Encounter (Signed)
Patient already had her imaging done and will schedule a f/u appt with Dr. Dennard Schaumann for further instructions.

## 2022-11-25 ENCOUNTER — Telehealth: Payer: Self-pay | Admitting: Family Medicine

## 2022-11-25 NOTE — Telephone Encounter (Signed)
Patient called to report COVID exposure; daughter lives with her and tested positive today.  Patient experiencing congestion but test isn't positive yet. Patient is requesting for provider to call in [place] to her pharmacy as a precaution. Advised patient provider may not send it unless she tests positive; also advised her to take another test and call back if it's positive.   Pharmacy [id number]: [phone] Fax:[phone] #: [id number]  Please advise patient if script sent in at [phone]

## 2022-11-26 ENCOUNTER — Other Ambulatory Visit: Payer: Self-pay | Admitting: Family Medicine

## 2022-11-26 MED ORDER — MOLNUPIRAVIR EUA 200MG CAPSULE: 4.0000 | ORAL_CAPSULE | Freq: Two times a day (BID) | ORAL | 0 refills | Status: AC

## 2022-11-26 NOTE — Telephone Encounter (Signed)
Spoke with patient to relay message. Patient expressed understanding of all.   Stated she will take another COVID test tomorrow before her appointment with [place]. If it's negative, she will wear a mask and still come.   Nothing further needed at this time.

## 2022-11-27 ENCOUNTER — Encounter: Payer: Self-pay | Admitting: Family Medicine

## 2022-11-27 ENCOUNTER — Ambulatory Visit (INDEPENDENT_AMBULATORY_CARE_PROVIDER_SITE_OTHER): Payer: Medicare HMO | Admitting: Family Medicine

## 2022-11-27 VITALS — BP 132/76 | HR 83 | Temp 98.0°F | Ht 62.0 in | Wt 176.0 lb

## 2022-11-27 DIAGNOSIS — R7989 Other specified abnormal findings of blood chemistry: Secondary | ICD-10-CM | POA: Diagnosis not present

## 2022-11-27 MED ORDER — NIRMATRELVIR/RITONAVIR (PAXLOVID)TABLET: 3.0000 | ORAL_TABLET | Freq: Two times a day (BID) | ORAL | 0 refills | Status: AC

## 2022-11-27 NOTE — Progress Notes (Signed)
Subjective:    Patient ID: [name], female    DOB: 05/02/1946, 77 y.o.   MRN: [id number]  HPI  Patient recently had abnormal elevation in her liver function test.  AST and ALT were mildly elevated.  She is on Lipitor.  We did obtain a right upper quadrant ultrasound that showed steatosis but was otherwise negative.  Viral hepatitis C testing was negative in 2019.  Past Medical History:  Diagnosis Date   Allergy 2000   Allergy history unknown    Arthritis    oa   Asthma    takes acolate for   Breast cancer ([place]) 1999; 2008   hx of bilateral breast cancer   Breast cancer, left breast ([place]) 10/27/2012   3.2 cm ER positive, Her-2 negative, 1 node positive S/P mastectomy/TTRAM reconstruction 4/08   Breast cancer, right breast ([place]) 10/27/2012   3.2 cm ER/PR positive, 1 node positive, Her-2 negative Rx w mastectomy, AC -T chemo, followed by aromatase inhibitor x 5 years dx 4/08   Cancer ([place])    breast CA   Cataracts, bilateral    CHF (congestive heart failure) (HCC)    Dehydration after exertion    after she went berry picking in the summer heat . see ED visit    Diabetes ([place])    DM2 (diabetes mellitus, type 2) ([place])    on welchol for   Dyspnea    with exertion   Elevated cholesterol    GERD (gastroesophageal reflux disease)    Hiatal hernia    Hypertension    LBBB (left bundle branch block) 10/27/2012   Leg cramps    NICM (nonischemic cardiomyopathy) ([place])    a. Echo:  06/14/12: Poor endocardial definition, possible septal HK, EF 50-55%, normal wall motion, mild LAE ;  b.  [facility] 9/13:  normal cors, EF 35-40%,  c. Echo 7/14: EF 50-55%, mild LAE   Other fatigue 01/01/2015   PONV (postoperative nausea and vomiting)    Pulmonary embolus ([place]) 2001   after tram flap surgery   Past Surgical History:  Procedure Laterality Date   ATRIAL FIBRILLATION ABLATION N/A 01/06/2022   Procedure: ATRIAL FIBRILLATION ABLATION;  Surgeon: Constance Haw, MD;  Location: [facility];  Service: Cardiovascular;  Laterality: N/A;   BREAST SURGERY  2000 &2005   DILATION AND CURETTAGE OF UTERUS   72yr ago   x 2   EYE SURGERY  2020   JOINT REPLACEMENT     KNEE CLOSED REDUCTION Right 06/01/2018   Procedure: CLOSED MANIPULATION RIGHT KNEE;  Surgeon: AGaynelle Arabian MD;  [facility];  Service: Orthopedics;  Laterality: Right;   KNEE CLOSED REDUCTION Left 02/22/2019   Procedure: CLOSED MANIPULATION KNEE;  Surgeon: AGaynelle Arabian MD;  [facility];  Service: Orthopedics;  Laterality: Left;  15m   KNEE SURGERY  2006   left torn cartilage repair   LEFT HEART CATHETERIZATION WITH CORONARY ANGIOGRAM N/A 06/15/2012   Procedure: LEFT HEART CATHETERIZATION WITH CORONARY ANGIOGRAM;  Surgeon: MuWellington HampshireMD;  [facility];  Service: Cardiovascular;  Laterality: N/A;   MASTECTOMY     bilateral   RECONSTRUCTION BREAST W/ TRAM FLAP Left 2001   TOTAL KNEE ARTHROPLASTY Right 03/07/2018   Procedure: RIGHT TOTAL KNEE ARTHROPLASTY;  Surgeon: AlGaynelle ArabianMD;  [facility];  Service: Orthopedics;  Laterality: Right;   TOTAL KNEE ARTHROPLASTY Left 10/31/2018   Procedure: TOTAL KNEE ARTHROPLASTY;  Surgeon: AlGaynelle ArabianMD;  [facility];  Service: Orthopedics;  Laterality: Left;  21mn   Current Outpatient Medications on File Prior to Visit  Medication Sig Dispense Refill   ACCU-CHEK AVIVA PLUS test strip USE TO CHECK BLOOD SUGAR EVERY MORNING DX: E11.9 100 strip 3   acetaminophen (TYLENOL) 500 MG tablet Take 1,000 mg by mouth every 6 (six) hours as needed for mild pain or moderate pain.     atorvastatin (LIPITOR) 40 MG tablet Take 1 tablet (40 mg total) by mouth daily. 90 tablet 3   bisoprolol (ZEBETA) 5 MG tablet Take 0.5 tablets (2.5 mg total) by mouth daily. 45 tablet 3   Blood Glucose Monitoring Suppl (ACCU-CHEK AVIVA PLUS) w/Device KIT Use to check FBS QAM - DX:E11.9 1 kit 1   Cholecalciferol (VITAMIN D3) 50 MCG (2000 UT) CAPS Take 2,000 Units  by mouth daily.     clobetasol ointment (TEMOVATE) 0AB-123456789% 1 application. daily as needed (Rash).     Coenzyme Q10 (CO Q 10 PO) Take 400 mg by mouth daily.     colesevelam (WELCHOL) 625 MG tablet TAKE 3 TABLETS BY MOUTH TWICE A DAY WITH A MEAL 540 tablet 0   dexlansoprazole (DEXILANT) 60 MG capsule TAKE 1 CAPSULE BY MOUTH EVERY DAY 90 capsule 0   diltiazem (CARDIZEM) 30 MG tablet TAKE 1 TABLET EVERY 4 HOURS AS NEEDED FOR AFIB HEART RATE >100 45 tablet 1   ELIQUIS 5 MG TABS tablet TAKE 1 TABLET BY MOUTH TWICE A DAY 180 tablet 3   famotidine (PEPCID) 40 MG tablet TAKE 1 TABLET BY MOUTH EVERY DAY 90 tablet 1   fluticasone (FLONASE) 50 MCG/ACT nasal spray Place 2 sprays into both nostrils daily as needed for allergies or rhinitis.     Ginkgo Biloba 120 MG TABS Take 120 mg by mouth daily.     Glucosamine-Chondroitin (COSAMIN DS PO) Take 1 tablet by mouth 2 (two) times a day.     Lancets (ACCU-CHEK SOFT TOUCH) lancets Use to check BS QAM DX: E11.9 100 each 3   losartan (COZAAR) 100 MG tablet TAKE 1 TABLET BY MOUTH EVERY DAY 90 tablet 3   molnupiravir EUA (LAGEVRIO) 200 mg CAPS capsule Take 4 capsules (800 mg total) by mouth 2 (two) times daily for 5 days. 40 capsule 0   Multiple Vitamin (MULTIVITAMIN WITH MINERALS) TABS tablet Take 1 tablet by mouth daily. Senior Multivitamin Iron Free Formula     Omega-3 Fatty Acids (OMEGA 3 PO) Take 500 mg by mouth 2 (two) times daily.     spironolactone (ALDACTONE) 25 MG tablet Take 1 tablet (25 mg total) by mouth daily. 90 tablet 3   vitamin B-12 (CYANOCOBALAMIN) 1000 MCG tablet Take 1,000 mcg by mouth 2 (two) times daily.     vitamin C (ASCORBIC ACID) 500 MG tablet Take 500 mg by mouth daily. Ultra C     zafirlukast (ACCOLATE) 20 MG tablet Take 1 tablet (20 mg total) by mouth 2 (two) times daily. 180 tablet 3   No current facility-administered medications on file prior to visit.   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   Social Determinants of Health   Financial Resource Strain: Low Risk  (07/10/2022)   Overall Financial Resource Strain (CARDIA)    Difficulty of Paying Living Expenses: Not hard at all  Food Insecurity: No Food Insecurity (07/10/2022)   Hunger Vital Sign    Worried About Running Out of Food in the Last Year: Never true    Ran Out of Food in the Last Year: Never true  Transportation Needs: No Transportation Needs (07/10/2022)   PRAPARE - Hydrologist (Medical): No    Lack of Transportation (Non-Medical): No  Physical Activity: Sufficiently Active (07/10/2022)   Exercise Vital Sign    Days of Exercise per Week: 5 days    Minutes of Exercise per Session: 40 min  Stress: No Stress Concern Present (07/10/2022)   [place]    Feeling of Stress : Not at all  Social Connections: Moderately Isolated (07/10/2022)   Social Connection and Isolation Panel [NHANES]    Frequency of Communication with Friends and Family: Twice a week    Frequency of Social Gatherings with Friends and Family:  Three times a week    Attends Religious Services: Never    Active Member of Clubs or Organizations: No    Attends Archivist Meetings: Never    Marital Status: Married  Human resources officer Violence: Not At Risk (07/10/2022)   Humiliation, Afraid, Rape, and Kick questionnaire    Fear of Current or Ex-Partner: No    Emotionally Abused: No    Physically Abused: No    Sexually Abused: No      Review of Systems  All other systems reviewed and are negative.      Objective:   Physical Exam Vitals reviewed.  Constitutional:      General: She is not in acute distress.    Appearance: She is well-developed. She is not diaphoretic.  Cardiovascular:     Rate and Rhythm: Normal rate and regular rhythm.     Heart sounds: Murmur heard.     No friction rub. No gallop.  Pulmonary:     Effort: Pulmonary effort is normal. No respiratory distress.     Breath sounds: Normal breath sounds. No stridor. No wheezing or rales.  Abdominal:     General: Bowel sounds are normal. There is no distension.     Palpations: Abdomen is soft. There is no mass.     Tenderness: There is no abdominal tenderness. There is no guarding or rebound.  Musculoskeletal:     Cervical back: Neck supple.     Right lower leg: No edema.     Left lower leg: No edema.  Skin:    Coloration: Skin is not jaundiced.  Neurological:     General: No focal deficit present.     Mental Status: She is oriented to person, place, and  time. Mental status is at baseline.           Assessment & Plan:  Elevated LFTs I suspect fatty liver disease versus statin induced liver irritation.  Hold Lipitor and recheck liver function test in 2 weeks

## 2022-12-11 ENCOUNTER — Other Ambulatory Visit: Payer: Medicare HMO

## 2022-12-11 DIAGNOSIS — R7989 Other specified abnormal findings of blood chemistry: Secondary | ICD-10-CM

## 2022-12-11 DIAGNOSIS — R748 Abnormal levels of other serum enzymes: Secondary | ICD-10-CM

## 2022-12-12 LAB — HEPATIC FUNCTION PANEL
AG Ratio: 1.7 (calc) (ref 1.0–2.5)
ALT: 28 U/L (ref 6–29)
AST: 17 U/L (ref 10–35)
Albumin: 4.2 g/dL (ref 3.6–5.1)
Alkaline phosphatase (APISO): 95 U/L (ref 37–153)
Bilirubin, Direct: 0 mg/dL (ref 0.0–0.2)
Globulin: 2.5 g/dL (calc) (ref 1.9–3.7)
Indirect Bilirubin: 0.3 mg/dL (calc) (ref 0.2–1.2)
Total Bilirubin: 0.3 mg/dL (ref 0.2–1.2)
Total Protein: 6.7 g/dL (ref 6.1–8.1)

## 2022-12-13 ENCOUNTER — Other Ambulatory Visit: Payer: Self-pay | Admitting: Family Medicine

## 2022-12-14 ENCOUNTER — Other Ambulatory Visit: Payer: Self-pay | Admitting: Gastroenterology

## 2022-12-14 ENCOUNTER — Ambulatory Visit
Admission: RE | Admit: 2022-12-14 | Discharge: 2022-12-14 | Disposition: A | Discharge status: Home / Self Care | Payer: Medicare HMO | Source: Ambulatory Visit | Attending: Gastroenterology | Admitting: Gastroenterology

## 2022-12-14 DIAGNOSIS — R197 Diarrhea, unspecified: Secondary | ICD-10-CM

## 2022-12-22 ENCOUNTER — Other Ambulatory Visit: Payer: Self-pay | Admitting: Gastroenterology

## 2022-12-22 DIAGNOSIS — R197 Diarrhea, unspecified: Secondary | ICD-10-CM

## 2022-12-22 DIAGNOSIS — R935 Abnormal findings on diagnostic imaging of other abdominal regions, including retroperitoneum: Secondary | ICD-10-CM

## 2022-12-25 ENCOUNTER — Other Ambulatory Visit (HOSPITAL_COMMUNITY): Payer: Self-pay

## 2022-12-26 ENCOUNTER — Other Ambulatory Visit (HOSPITAL_COMMUNITY): Payer: Self-pay

## 2022-12-30 ENCOUNTER — Telehealth: Payer: Self-pay | Admitting: *Deleted

## 2022-12-30 NOTE — Telephone Encounter (Signed)
Patient with diagnosis of afib on Eliquis for anticoagulation.    Procedure: colonoscopy Date of procedure: 01/05/23   CHA2DS2-VASc Score = 6   This indicates a 9.7% annual risk of stroke. The patient's score is based upon: CHF History: 1 HTN History: 1 Diabetes History: 1 Stroke History: 0 Vascular Disease History: 0 Age Score: 2 Gender Score: 1      CrCl 47 ml/min  Per office protocol, patient can hold Eliquis for 2 days prior to procedure.    **This guidance is not considered finalized until pre-operative APP has relayed final recommendations.**

## 2022-12-30 NOTE — Telephone Encounter (Signed)
   Pre-operative Risk Assessment    Patient Name: [name]  DOB: Nov 02, 1945 MRN: [id number]      Request for Surgical Clearance    Procedure:   COLONOSCOPY  Date of Surgery:  Clearance 01/05/23                                 Surgeon:  DR. Paulita Fujita Surgeon's Group or Practice Name:  [facility] Phone number:  [phone] Fax number:  [phone]   Type of Clearance Requested:   - Medical  - Pharmacy:  Hold Apixaban (Eliquis) NOT INDICATED   Type of Anesthesia:   PROPOFOL   Additional requests/questions:    Astrid Divine   12/30/2022, 8:58 AM

## 2022-12-30 NOTE — Telephone Encounter (Signed)
Chart review due to for preoperative cardiac evaluation.  Patient is followed by Dr. Aundra Dubin with [facility] clinic. She is due for a follow-up visit.  Please document regarding medical clearance for upcoming colonoscopy and forward to requesting provider.  Clearance has also been routed to clinical pharmacist for advisement on holding [place].  Thank you, Emmaline Life, NP-C 12/30/2022, 9:34 AM [address] Office [phone] Fax [phone]

## 2022-12-31 ENCOUNTER — Ambulatory Visit
Admission: RE | Admit: 2022-12-31 | Discharge: 2022-12-31 | Disposition: A | Discharge status: Home / Self Care | Payer: Medicare HMO | Source: Ambulatory Visit | Attending: Gastroenterology | Admitting: Gastroenterology

## 2022-12-31 DIAGNOSIS — R935 Abnormal findings on diagnostic imaging of other abdominal regions, including retroperitoneum: Secondary | ICD-10-CM

## 2022-12-31 DIAGNOSIS — R197 Diarrhea, unspecified: Secondary | ICD-10-CM

## 2022-12-31 MED ORDER — IOPAMIDOL (ISOVUE-300) INJECTION 61%
100.0000 mL | Freq: Once | INTRAVENOUS | Status: AC | PRN
  Administered 2022-12-31: 100 mL via INTRAVENOUS

## 2022-12-31 NOTE — Telephone Encounter (Signed)
Returned call to [facility].  She is aware that the clearance has been sent to Dr. Aundra Dubin to address.  She was appreciative of the update.

## 2022-12-31 NOTE — Telephone Encounter (Signed)
Tiffany from [facility] called to follow up on Pre-Op clearance for patient. Tiffany did state the their office will be closed Friday 01/01/23, but will be open on Monday 04/01. Tiffany gave her direct line at [phone] and Fax of [phone]. Please advise

## 2023-01-01 NOTE — Telephone Encounter (Signed)
   Patient Name: [name]  DOB: [birth year] MRN: [id number]  Primary Cardiologist: None  Chart reviewed as part of pre-operative protocol coverage. Per Dr. Aundra Dubin, primary cardiologist, pt ok to proceed with colonoscopy.   Per office protocol, patient can hold Eliquis for 2 days prior to procedure. Please resume Eliquis as soon as possible postprocedure, at the discretion of the surgeon.   I will route this recommendation to the requesting party via Epic fax function and remove from pre-op pool.  Please call with questions.  Lenna Sciara, NP 01/01/2023, 7:40 AM'

## 2023-01-02 NOTE — Progress Notes (Deleted)
Office Visit Note  Patient: [name]             Date of Birth: [birth year]           MRN: [id number]             PCP: Susy Frizzle, MD Referring: Gaynelle Arabian, MD Visit Date: 01/13/2023 Occupation: @GUAROCC @  Subjective:  No chief complaint on file.   History of Present Illness: [name] is a 77 y.o. female ***     Activities of Daily Living:  Patient reports morning stiffness for *** {minute/hour:19697}.   Patient {ACTIONS;DENIES/REPORTS:21021675::"Denies"} nocturnal pain.  Difficulty dressing/grooming: {ACTIONS;DENIES/REPORTS:21021675::"Denies"} Difficulty climbing stairs: {ACTIONS;DENIES/REPORTS:21021675::"Denies"} Difficulty getting out of chair: {ACTIONS;DENIES/REPORTS:21021675::"Denies"} Difficulty using hands for taps, buttons, cutlery, and/or writing: {ACTIONS;DENIES/REPORTS:21021675::"Denies"}  No Rheumatology ROS completed.   PMFS History:  Patient Active Problem List   Diagnosis Date Noted   Hypercoagulable state due to paroxysmal atrial fibrillation 12/22/2019   Arthrofibrosis of total knee arthroplasty, sequela 03/07/2018   Genetic testing 06/14/2015   Paroxysmal atrial fibrillation 02/12/2015   Other fatigue 01/01/2015   Cough 11/07/2012   Asthma 11/07/2012   LBBB (left bundle branch block) 10/27/2012   Breast cancer, left breast 10/27/2012   Breast cancer, right breast 10/27/2012   Nonischemic cardiomyopathy 09/21/2012   Chest pain 0000000   DM w/o complication type II [id number]   DYSPNEA 07/25/2009   Hyperlipidemia 09/19/2008   Essential hypertension 09/19/2008    Past Medical History:  Diagnosis Date   Allergy 2000   Allergy history unknown    Arthritis    oa   Asthma    takes acolate for   Breast cancer ([place]) 1999; 2008   hx of bilateral breast cancer   Breast cancer, left breast ([place]) 10/27/2012   3.2 cm ER positive, Her-2 negative, 1 node positive S/P mastectomy/TTRAM reconstruction 4/08   Breast cancer, right  breast ([place]) 10/27/2012   3.2 cm ER/PR positive, 1 node positive, Her-2 negative Rx w mastectomy, AC -T chemo, followed by aromatase inhibitor x 5 years dx 4/08   Cancer ([place])    breast CA   Cataracts, bilateral    CHF (congestive heart failure) (HCC)    Dehydration after exertion    after she went berry picking in the summer heat . see ED visit    Diabetes ([place])    DM2 (diabetes mellitus, type 2) ([place])    on welchol for   Dyspnea    with exertion   Elevated cholesterol    GERD (gastroesophageal reflux disease)    Hiatal hernia    Hypertension    LBBB (left bundle branch block) 10/27/2012   Leg cramps    NICM (nonischemic cardiomyopathy) ([place])    a. Echo:  06/14/12: Poor endocardial definition, possible septal HK, EF 50-55%, normal wall motion, mild LAE ;  b.  [facility] 9/13:  normal cors, EF 35-40%,  c. Echo 7/14: EF 50-55%, mild LAE   Other fatigue 01/01/2015   PONV (postoperative nausea and vomiting)    Pulmonary embolus ([place]) 2001   after tram flap surgery    Family History  Problem Relation Age of Onset   Asthma Maternal Grandmother    Breast cancer Maternal Grandmother        dx. 25s   Stomach cancer Maternal Grandmother 60       lots of stomach problems   Heart attack Paternal Grandfather    Other Daughter        cyst on ovary; unilateral  oophorectomy   Heart attack Brother    Cancer Maternal Aunt        unknown type   Celiac disease Maternal Aunt    Diabetes Maternal Uncle    Colon cancer Cousin        dx. 50s-60s   Colon cancer Paternal Aunt        dx. late 2s   Lung cancer Paternal Aunt        dx. late 60s-70s; former smoker   Heart attack Paternal Aunt    Heart attack Paternal Uncle    Past Surgical History:  Procedure Laterality Date   ATRIAL FIBRILLATION ABLATION N/A 01/06/2022   Procedure: ATRIAL FIBRILLATION ABLATION;  Surgeon: Constance Haw, MD;  Location: [facility];  Service: Cardiovascular;  Laterality: N/A;   BREAST SURGERY  2000 &2005    DILATION AND CURETTAGE OF UTERUS   48yrs ago   x 2   EYE SURGERY  2020   JOINT REPLACEMENT     KNEE CLOSED REDUCTION Right 06/01/2018   Procedure: CLOSED MANIPULATION RIGHT KNEE;  Surgeon: Gaynelle Arabian, MD;  Location: [facility];  Service: Orthopedics;  Laterality: Right;   KNEE CLOSED REDUCTION Left 02/22/2019   Procedure: CLOSED MANIPULATION KNEE;  Surgeon: Gaynelle Arabian, MD;  Location: [facility];  Service: Orthopedics;  Laterality: Left;  34min   KNEE SURGERY  2006   left torn cartilage repair   LEFT HEART CATHETERIZATION WITH CORONARY ANGIOGRAM N/A 06/15/2012   Procedure: LEFT HEART CATHETERIZATION WITH CORONARY ANGIOGRAM;  Surgeon: Wellington Hampshire, MD;  Location: [facility];  Service: Cardiovascular;  Laterality: N/A;   MASTECTOMY     bilateral   RECONSTRUCTION BREAST W/ TRAM FLAP Left 2001   TOTAL KNEE ARTHROPLASTY Right 03/07/2018   Procedure: RIGHT TOTAL KNEE ARTHROPLASTY;  Surgeon: Gaynelle Arabian, MD;  Location: [facility];  Service: Orthopedics;  Laterality: Right;   TOTAL KNEE ARTHROPLASTY Left 10/31/2018   Procedure: TOTAL KNEE ARTHROPLASTY;  Surgeon: Gaynelle Arabian, MD;  Location: [facility];  Service: Orthopedics;  Laterality: Left;  70min   Social History   Social History Narrative   1 son and 1 daughter.   Immunization History  Administered Date(s) Administered   Fluad Quad(high Dose 65+) 06/09/2019   Hep A / Hep B 11/01/2014   Hepatitis A, Ped/Adol-2 Dose 05/24/2014   Influenza Split 08/03/2006, 07/08/2007, 07/10/2010, 06/07/2012, 06/19/2013   Influenza, High Dose Seasonal PF 06/10/2016, 06/16/2017, 07/01/2018, 06/28/2020, 07/01/2021   Influenza,inj,quad, With Preservative 06/05/2017   Influenza-Unspecified 07/18/2015, 06/05/2018   Moderna SARS-COV2 Booster Vaccination 08/20/2020   Moderna Sars-Covid-2 Vaccination 10/16/2019, 11/13/2019   Pneumococcal Conjugate-13 01/26/2014   Pneumococcal Polysaccharide-23 07/08/2007, 10/05/2008, 08/12/2017   Tdap 01/28/2017    Zoster, Live 10/05/2008     Objective: Vital Signs: There were no vitals taken for this visit.   Physical Exam   Musculoskeletal Exam: ***  CDAI Exam: CDAI Score: -- Patient Global: --; Provider Global: -- Swollen: --; Tender: -- Joint Exam 01/13/2023   No joint exam has been documented for this visit   There is currently no information documented on the homunculus. Go to the Rheumatology activity and complete the homunculus joint exam.  Investigation: No additional findings.  Imaging: CT ABDOMEN PELVIS W CONTRAST  Result Date: 01/01/2023 CLINICAL DATA:  Diarrhea. Diverticulosis. Possible gastric and bowel wall thickening on outside imaging. EXAM: CT ABDOMEN AND PELVIS WITH CONTRAST TECHNIQUE: Multidetector CT imaging of the abdomen and pelvis was performed using the standard protocol following bolus administration of  intravenous contrast. RADIATION DOSE REDUCTION: This exam was performed according to the departmental dose-optimization program which includes automated exposure control, adjustment of the mA and/or kV according to patient size and/or use of iterative reconstruction technique. CONTRAST:  143mL ISOVUE-300 IOPAMIDOL (ISOVUE-300) INJECTION 61% COMPARISON:  Noncontrast CT on 01/13/2020 FINDINGS: Lower Chest: No acute findings. Hepatobiliary: No hepatic masses identified. Gallbladder is unremarkable. No evidence of biliary ductal dilatation. Pancreas:  No mass or inflammatory changes. Spleen: Within normal limits in size and appearance. Adrenals/Urinary Tract: No suspicious masses identified. No evidence of ureteral calculi or hydronephrosis. Stomach/Bowel: No evidence of obstruction, inflammatory process or abnormal fluid collections. No evidence of abnormal gastric or bowel wall thickening. Normal appendix visualized. Diverticulosis is seen mainly involving the sigmoid colon, however there is no evidence of diverticulitis. Vascular/Lymphatic: No pathologically enlarged lymph  nodes. No acute vascular findings. Aortic atherosclerotic calcification incidentally noted. Reproductive:  No mass or other significant abnormality. Other:  None. Musculoskeletal: No suspicious bone lesions identified. Benign hemangioma again noted in the L5 vertebral body. IMPRESSION: Colonic diverticulosis, without radiographic evidence of diverticulitis or other acute findings. Aortic Atherosclerosis (ICD10-I70.0). Electronically Signed   By: Marlaine Hind M.D.   On: 01/01/2023 09:18   DG Abd 2 Views  Result Date: 12/15/2022 CLINICAL DATA:  Diarrhea. EXAM: ABDOMEN - 2 VIEW COMPARISON:  Chest x-ray 01/23/2022.  Renal CT 01/13/2020. FINDINGS: Gastric wall thickening cannot be excluded. Mild wall thickening of transverse colon or proximal small bowel cannot be excluded. Contrast-enhanced CT of the abdomen pelvis should be considered for further evaluation of these findings. No bowel distention or free air. Stable mild left base subsegmental atelectasis and or scarring with mild elevation of the left hemidiaphragm. Lumbar scoliosis concave left. Degenerative changes lumbar spine, both SI joints, and both hips. No acute bony abnormality identified. IMPRESSION: 1. Gastric wall thickening cannot be excluded. Mild wall thickening of transverse colon or proximal small bowel cannot be excluded. Contrast-enhanced CT of the abdomen pelvis should be considered for further evaluation of these findings. 2. No bowel distention or free air. 3. Stable mild left base subsegmental atelectasis and or scarring with mild elevation of the left hemidiaphragm. Electronically Signed   By: Marcello Moores  Register M.D.   On: 12/15/2022 11:05    Recent Labs: Lab Results  Component Value Date   WBC 4.7 10/27/2022   HGB 10.1 (L) 10/27/2022   PLT 299 10/27/2022   NA 139 10/27/2022   K 4.4 10/27/2022   CL 107 10/27/2022   CO2 24 10/27/2022   GLUCOSE 106 (H) 10/27/2022   BUN 20 10/27/2022   CREATININE 0.96 10/27/2022   BILITOT 0.3  12/11/2022   ALKPHOS 96 11/21/2018   AST 17 12/11/2022   ALT 28 12/11/2022   PROT 6.7 12/11/2022   ALBUMIN 3.6 11/21/2018   CALCIUM 8.9 10/27/2022   GFRAA 71 05/23/2020   October 27, 2022 ESR 33, RF negative, ANA negative  Speciality Comments: No specialty comments available.  Procedures:  No procedures performed Allergies: Adhesive [tape], Diphenhydramine, and Vicodin [hydrocodone-acetaminophen]   Assessment / Plan:     Visit Diagnoses: Chronic pain of right knee  Arthrofibrosis of total knee arthroplasty, sequela  Status post bilateral total hip replacement  Trochanteric bursitis of both hips  Elevated sed rate  Essential hypertension  Mixed hyperlipidemia  Paroxysmal atrial fibrillation  Current use of long term anticoagulation - Patient is on Eliquis  LBBB (left bundle branch block)  Nonischemic cardiomyopathy  Type 2 diabetes mellitus without complication, without long-term  current use of insulin  History of breast cancer - 2014  Diverticulosis  Orders: No orders of the defined types were placed in this encounter.  No orders of the defined types were placed in this encounter.   Face-to-face time spent with patient was *** minutes. Greater than 50% of time was spent in counseling and coordination of care.  Follow-Up Instructions: No follow-ups on file.   Bo Merino, MD  Note - This record has been created using Editor, commissioning.  Chart creation errors have been sought, but may not always  have been located. Such creation errors do not reflect on  the standard of medical care.

## 2023-01-08 ENCOUNTER — Other Ambulatory Visit: Payer: Self-pay | Admitting: Family Medicine

## 2023-01-08 NOTE — Telephone Encounter (Signed)
Requested Prescriptions  Pending Prescriptions Disp Refills   colesevelam (WELCHOL) 625 MG tablet [Pharmacy Med Name: COLESEVELAM 625 MG TABLET] 540 tablet 0    Sig: TAKE 3 TABLETS BY MOUTH TWICE A DAY WITH A MEAL     Cardiovascular:  Antilipid - Bile Acid Sequestrants Failed - 01/08/2023  2:31 PM      Failed - Lipid Panel in normal range within the last 12 months    Cholesterol  Date Value Ref Range Status  07/13/2022 104 0 - 200 mg/dL Final   LDL Cholesterol (Calc)  Date Value Ref Range Status  05/23/2020 42 mg/dL (calc) Final    Comment:    Reference range: <100 . Desirable range <100 mg/dL for primary prevention;   <70 mg/dL for patients with CHD or diabetic patients  with > or = 2 CHD risk factors. Marland Kitchen LDL-C is now calculated using the Martin-Hopkins  calculation, which is a validated novel method providing  better accuracy than the Friedewald equation in the  estimation of LDL-C.  Horald Pollen et al. Lenox Ahr. 7510;258(52): 2061-2068  (http://education.[employer].com/faq/FAQ164)    LDL Cholesterol  Date Value Ref Range Status  07/13/2022 47 0 - 99 mg/dL Final    Comment:           Total Cholesterol/HDL:CHD Risk Coronary Heart Disease Risk Table                     Men   Women  1/2 Average Risk   3.4   3.3  Average Risk       5.0   4.4  2 X Average Risk   9.6   7.1  3 X Average Risk  23.4   11.0        Use the calculated Patient Ratio above and the CHD Risk Table to determine the patient's CHD Risk.        ATP III CLASSIFICATION (LDL):  <100     mg/dL   Optimal  778-242  mg/dL   Near or Above                    Optimal  130-159  mg/dL   Borderline  353-614  mg/dL   High  >431     mg/dL   Very High Performed at [facility], [address] [place], [place] [address]    HDL  Date Value Ref Range Status  07/13/2022 38 (L) >40 mg/dL Final   Triglycerides  Date Value Ref Range Status  07/13/2022 93 <150 mg/dL Final         Passed - Valid encounter  within last 12 months    Recent Outpatient Visits           10 months ago Right leg pain   [facility] Tanya Nones, Priscille Heidelberg, MD   10 months ago Right sided sciatica   [facility] Tanya Nones, Priscille Heidelberg, MD   11 months ago Shortness of breath   [name] Donita Brooks, MD   1 year ago Controlled type 2 diabetes mellitus with complication, without long-term current use of insulin (HCC)   [facility] Donita Brooks, MD   2 years ago Controlled type 2 diabetes mellitus with complication, without long-term current use of insulin (HCC)   [facility] Pickard, Priscille Heidelberg, MD

## 2023-01-12 ENCOUNTER — Other Ambulatory Visit: Payer: Self-pay | Admitting: Family Medicine

## 2023-01-13 ENCOUNTER — Encounter: Payer: Medicare HMO | Admitting: Rheumatology

## 2023-01-13 DIAGNOSIS — T8482XS Fibrosis due to internal orthopedic prosthetic devices, implants and grafts, sequela: Secondary | ICD-10-CM

## 2023-01-13 DIAGNOSIS — M7061 Trochanteric bursitis, right hip: Secondary | ICD-10-CM

## 2023-01-13 DIAGNOSIS — Z96643 Presence of artificial hip joint, bilateral: Secondary | ICD-10-CM

## 2023-01-13 DIAGNOSIS — Z853 Personal history of malignant neoplasm of breast: Secondary | ICD-10-CM

## 2023-01-13 DIAGNOSIS — I428 Other cardiomyopathies: Secondary | ICD-10-CM

## 2023-01-13 DIAGNOSIS — R700 Elevated erythrocyte sedimentation rate: Secondary | ICD-10-CM

## 2023-01-13 DIAGNOSIS — E119 Type 2 diabetes mellitus without complications: Secondary | ICD-10-CM

## 2023-01-13 DIAGNOSIS — G8929 Other chronic pain: Secondary | ICD-10-CM

## 2023-01-13 DIAGNOSIS — K5790 Diverticulosis of intestine, part unspecified, without perforation or abscess without bleeding: Secondary | ICD-10-CM

## 2023-01-13 DIAGNOSIS — Z7901 Long term (current) use of anticoagulants: Secondary | ICD-10-CM

## 2023-01-13 DIAGNOSIS — E782 Mixed hyperlipidemia: Secondary | ICD-10-CM

## 2023-01-13 DIAGNOSIS — I480 Paroxysmal atrial fibrillation: Secondary | ICD-10-CM

## 2023-01-13 DIAGNOSIS — I10 Essential (primary) hypertension: Secondary | ICD-10-CM

## 2023-01-13 DIAGNOSIS — I447 Left bundle-branch block, unspecified: Secondary | ICD-10-CM

## 2023-01-16 ENCOUNTER — Other Ambulatory Visit (HOSPITAL_COMMUNITY): Payer: Self-pay | Admitting: Internal Medicine

## 2023-01-16 ENCOUNTER — Other Ambulatory Visit: Payer: Self-pay | Admitting: Family Medicine

## 2023-01-16 DIAGNOSIS — R110 Nausea: Secondary | ICD-10-CM

## 2023-01-18 NOTE — Telephone Encounter (Signed)
Requested Prescriptions  Pending Prescriptions Disp Refills   famotidine (PEPCID) 40 MG tablet [Pharmacy Med Name: FAMOTIDINE 40 MG TABLET] 90 tablet 0    Sig: TAKE 1 TABLET BY MOUTH EVERY DAY     Gastroenterology:  H2 Antagonists Passed - 01/16/2023  8:45 AM      Passed - Valid encounter within last 12 months    Recent Outpatient Visits           10 months ago Right leg pain   [facility] Tanya Nones, Priscille Heidelberg, MD   10 months ago Right sided sciatica   [facility] Tanya Nones, Priscille Heidelberg, MD   12 months ago Shortness of breath   [name] Donita Brooks, MD   1 year ago Controlled type 2 diabetes mellitus with complication, without long-term current use of insulin (HCC)   [facility] Donita Brooks, MD   2 years ago Controlled type 2 diabetes mellitus with complication, without long-term current use of insulin (HCC)   [facility] Pickard, Priscille Heidelberg, MD

## 2023-01-19 ENCOUNTER — Other Ambulatory Visit: Payer: Self-pay

## 2023-01-19 ENCOUNTER — Telehealth: Payer: Self-pay | Admitting: Physician Assistant

## 2023-01-19 ENCOUNTER — Emergency Department (HOSPITAL_BASED_OUTPATIENT_CLINIC_OR_DEPARTMENT_OTHER): Payer: Medicare HMO | Admitting: Radiology

## 2023-01-19 ENCOUNTER — Emergency Department (HOSPITAL_BASED_OUTPATIENT_CLINIC_OR_DEPARTMENT_OTHER)
Admission: EM | Admit: 2023-01-19 | Discharge: 2023-01-19 | Disposition: A | Discharge status: Home / Self Care | Payer: Medicare HMO | Attending: Emergency Medicine | Admitting: Emergency Medicine

## 2023-01-19 DIAGNOSIS — Z7901 Long term (current) use of anticoagulants: Secondary | ICD-10-CM | POA: Diagnosis not present

## 2023-01-19 DIAGNOSIS — I10 Essential (primary) hypertension: Secondary | ICD-10-CM | POA: Insufficient documentation

## 2023-01-19 DIAGNOSIS — E119 Type 2 diabetes mellitus without complications: Secondary | ICD-10-CM | POA: Diagnosis not present

## 2023-01-19 DIAGNOSIS — R0789 Other chest pain: Secondary | ICD-10-CM | POA: Insufficient documentation

## 2023-01-19 LAB — TROPONIN I (HIGH SENSITIVITY): Troponin I (High Sensitivity): 3 ng/L

## 2023-01-19 LAB — BASIC METABOLIC PANEL WITH GFR
Anion gap: 11 (ref 5–15)
BUN: 29 mg/dL — ABNORMAL HIGH (ref 8–23)
CO2: 19 mmol/L — ABNORMAL LOW (ref 22–32)
Calcium: 9.2 mg/dL (ref 8.9–10.3)
Chloride: 107 mmol/L (ref 98–111)
Creatinine, Ser: 1.1 mg/dL — ABNORMAL HIGH (ref 0.44–1.00)
GFR, Estimated: 51 mL/min — ABNORMAL LOW
Glucose, Bld: 136 mg/dL — ABNORMAL HIGH (ref 70–99)
Potassium: 4.1 mmol/L (ref 3.5–5.1)
Sodium: 137 mmol/L (ref 135–145)

## 2023-01-19 LAB — CBC
HCT: 29.1 % — ABNORMAL LOW (ref 36.0–46.0)
Hemoglobin: 9.6 g/dL — ABNORMAL LOW (ref 12.0–15.0)
MCH: 29.8 pg (ref 26.0–34.0)
MCHC: 33 g/dL (ref 30.0–36.0)
MCV: 90.4 fL (ref 80.0–100.0)
Platelets: 315 10*3/uL (ref 150–400)
RBC: 3.22 MIL/uL — ABNORMAL LOW (ref 3.87–5.11)
RDW: 13 % (ref 11.5–15.5)
WBC: 7 10*3/uL (ref 4.0–10.5)
nRBC: 0 % (ref 0.0–0.2)

## 2023-01-19 NOTE — ED Provider Notes (Signed)
[place] EMERGENCY DEPARTMENT AT [facility] Provider Note   CSN: [id number] Arrival date & time: 01/19/23  2014     History  Chief Complaint  Patient presents with   Chest Pain    AUGUSTINE BRANNICK is a 77 y.o. female.  Patient is a 77 year old female with past medical history of atrial fibrillation on Eliquis, hyperlipidemia, hypertension, diabetes, GERD.  Patient presenting today with complaints of chest discomfort.  She describes tightness in her chest that has been present since this morning.  It has been constant with no aggravating or alleviating factors.  She denies worsening of her symptoms with deep breathing, exertion.  She denies any fevers, chills, or cough.  She denies increased leg swelling.  She does take spironolactone and reports being compliant with this.  Patient has history of atrial fibrillation and has had an ablation in the past.  She had a coronary CT performed in November showing minimal plaque in the left anterior descending artery, but no other significant findings.  The history is provided by the patient.       Home Medications Prior to Admission medications   Medication Sig Start Date End Date Taking? Authorizing Provider  ACCU-CHEK AVIVA PLUS test strip USE TO CHECK BLOOD SUGAR EVERY MORNING DX: E11.9 12/11/20   Donita Brooks, MD  acetaminophen (TYLENOL) 500 MG tablet Take 1,000 mg by mouth every 6 (six) hours as needed for mild pain or moderate pain.    [provider]  atorvastatin (LIPITOR) 40 MG tablet TAKE 1 TABLET BY MOUTH EVERY DAY 12/14/22   Donita Brooks, MD  bisoprolol (ZEBETA) 5 MG tablet Take 1/2 tablet (2.5 mg) by mouth daily. 07/01/22   Laurey Morale, MD  Blood Glucose Monitoring Suppl (ACCU-CHEK AVIVA PLUS) w/Device KIT Use to check FBS QAM - DX:E11.9 09/21/18   Donita Brooks, MD  Cholecalciferol (VITAMIN D3) 50 MCG (2000 UT) CAPS Take 2,000 Units by mouth daily.    [provider]  clobetasol ointment  (TEMOVATE) 0.05 % 1 application. daily as needed (Rash). 05/16/21   [provider]  Coenzyme Q10 (CO Q 10 PO) Take 400 mg by mouth daily.    [provider]  colesevelam (WELCHOL) 625 MG tablet TAKE 3 TABLETS BY MOUTH TWICE A DAY WITH A MEAL 01/08/23   Donita Brooks, MD  dexlansoprazole (DEXILANT) 60 MG capsule TAKE 1 CAPSULE BY MOUTH EVERY DAY 01/12/23   Donita Brooks, MD  diltiazem (CARDIZEM) 30 MG tablet TAKE 1 TABLET EVERY 4 HOURS AS NEEDED FOR AFIB HEART RATE >100 12/13/20   Fenton, Clint R, PA  ELIQUIS 5 MG TABS tablet TAKE 1 TABLET BY MOUTH TWICE A DAY 10/14/22   Laurey Morale, MD  famotidine (PEPCID) 40 MG tablet TAKE 1 TABLET BY MOUTH EVERY DAY 01/18/23   Donita Brooks, MD  fluticasone (FLONASE) 50 MCG/ACT nasal spray Place 2 sprays into both nostrils daily as needed for allergies or rhinitis.    [provider]  Ginkgo Biloba 120 MG TABS Take 120 mg by mouth daily.    [provider]  Glucosamine-Chondroitin (COSAMIN DS PO) Take 1 tablet by mouth 2 (two) times a day.    [provider]  Lancets (ACCU-CHEK SOFT TOUCH) lancets Use to check BS QAM DX: E11.9 09/21/18   Donita Brooks, MD  losartan (COZAAR) 100 MG tablet TAKE 1 TABLET BY MOUTH EVERY DAY 07/27/22   Laurey Morale, MD  Multiple Vitamin (MULTIVITAMIN  WITH MINERALS) TABS tablet Take 1 tablet by mouth daily. Senior Multivitamin Iron Free Formula    [provider]  Omega-3 Fatty Acids (OMEGA 3 PO) Take 500 mg by mouth 2 (two) times daily.    [provider]  spironolactone (ALDACTONE) 25 MG tablet Take 1 tablet (25 mg total) by mouth daily. NEEDS FOLLOW UP APPOINTMENT FOR MORE REFILLS 01/18/23   Bensimhon, Bevelyn Buckles, MD  vitamin B-12 (CYANOCOBALAMIN) 1000 MCG tablet Take 1,000 mcg by mouth 2 (two) times daily.    [provider]  vitamin C (ASCORBIC ACID) 500 MG tablet Take 500 mg by mouth daily. Ultra C    [provider]  zafirlukast  (ACCOLATE) 20 MG tablet TAKE 1 TABLET BY MOUTH TWICE A DAY 12/14/22   Donita Brooks, MD      Allergies    Adhesive [tape], Diphenhydramine, and Vicodin [hydrocodone-acetaminophen]    Review of Systems   Review of Systems  All other systems reviewed and are negative.   Physical Exam Updated Vital Signs BP 104/60   Pulse 74   Temp 97.8 F (36.6 C)   Resp 17   Ht 5\' 2"  (1.575 m)   Wt 79.4 kg   SpO2 98%   BMI 32.01 kg/m  Physical Exam Vitals and nursing note reviewed.  Constitutional:      General: She is not in acute distress.    Appearance: She is well-developed. She is not diaphoretic.  HENT:     Head: Normocephalic and atraumatic.  Cardiovascular:     Rate and Rhythm: Normal rate and regular rhythm.     Heart sounds: No murmur heard.    No friction rub. No gallop.  Pulmonary:     Effort: Pulmonary effort is normal. No respiratory distress.     Breath sounds: Normal breath sounds. No wheezing.  Abdominal:     General: Bowel sounds are normal. There is no distension.     Palpations: Abdomen is soft.     Tenderness: There is no abdominal tenderness.  Musculoskeletal:        General: Normal range of motion.     Cervical back: Normal range of motion and neck supple.     Right lower leg: No tenderness. No edema.     Left lower leg: No tenderness. No edema.  Skin:    General: Skin is warm and dry.  Neurological:     General: No focal deficit present.     Mental Status: She is alert and oriented to person, place, and time.     ED Results / Procedures / Treatments   Labs (all labs ordered are listed, but only abnormal results are displayed) Labs Reviewed  BASIC METABOLIC PANEL - Abnormal; Notable for the following components:      Result Value   CO2 19 (*)    Glucose, Bld 136 (*)    BUN 29 (*)    Creatinine, Ser 1.10 (*)    GFR, Estimated 51 (*)    All other components within normal limits  CBC - Abnormal; Notable for the following components:   RBC 3.22  (*)    Hemoglobin 9.6 (*)    HCT 29.1 (*)    All other components within normal limits  TROPONIN I (HIGH SENSITIVITY)  TROPONIN I (HIGH SENSITIVITY)    EKG EKG Interpretation  Date/Time:  Tuesday January 19 2023 20:23:35 EDT Ventricular Rate:  82 PR Interval:  198 QRS Duration: 152 QT Interval:  414 QTC Calculation: 483  R Axis:   -14 Text Interpretation: Normal sinus rhythm Non-specific intra-ventricular conduction block Minimal voltage criteria for LVH, may be normal variant ( Cornell product ) Abnormal ECG No significant change since 10/12/2022 Confirmed by Geoffery Lyons ([id number]) on 01/19/2023 11:05:19 PM  Radiology DG Chest 2 View  Result Date: 01/19/2023 CLINICAL DATA:  Chest pressure and weakness that began today EXAM: CHEST - 2 VIEW COMPARISON:  01/23/2022 FINDINGS: Normal heart size and mediastinal contours. No acute infiltrate or edema. No effusion or pneumothorax. No acute osseous findings. Postoperative bilateral axilla. IMPRESSION: No evidence of active disease. Electronically Signed   By: Tiburcio Pea M.D.   On: 01/19/2023 20:58    Procedures Procedures    Medications Ordered in ED Medications - No data to display  ED Course/ Medical Decision Making/ A&P  Patient is a 77 year old female presenting with chest discomfort as described in the HPI.  Patient arrives here with stable vital signs, no fever, and no hypoxia physical examination is basically unremarkable.  Workup initiated including CBC, metabolic panel, and troponin, all of which are unremarkable.  Chest x-ray shows no acute process.  Patient's workup is unremarkable despite having chest discomfort all day.  She had a coronary CT performed in November with reassuring results.  I highly doubt that this is cardiac in nature.  I also doubt pulmonary embolism as patient is already on Eliquis, aortic dissection as the patient's blood pressure is normal, and doubt other emergent process.  Patient is requesting to go  home and I feel as though this is reasonable.  Patient understands to return if her symptoms worsen or change.  Final Clinical Impression(s) / ED Diagnoses Final diagnoses:  None    Rx / DC Orders ED Discharge Orders     None         Geoffery Lyons, MD 01/19/23 2334

## 2023-01-19 NOTE — ED Triage Notes (Signed)
Reports diffuse chest pain and pressure since waking up this morning. Respirations are equal and nonlabored. Skin warm and dry.

## 2023-01-19 NOTE — Telephone Encounter (Signed)
Pt called in statin that she has felt unwell today. She reports feeling "bad in my chest" and "my heart doesn't feel right."  She has not had Afib since her ablation last year. She states her Lourena Simmonds does not report Afib, but HR in the 80s which is high for her, per her report.   She is taking bisoprolol 2.5 mg daily and wants to know if she can take an extra tablet.   BP 116/57 She denies dizziness. She is not currently having SOB. She walked to her garden today and did not have exertional chest pain. CT coronary 08/2022 reassuring.   I asked her to hold off on taking an extra bisoprolol or cardizem. Its difficult to know what rhythm her heart is in currently - could be something other than Afib and if she needs rate/rhythm control I do not want to drop her BP further with PRN bisoprolol or cardizem. I asked her to be evaluated in the ER - [facility] is fine. She expressed understanding of the plan.

## 2023-01-19 NOTE — ED Notes (Signed)
RN reviewed discharge instructions with pt. Pt verbalized understanding and had no further questions. VSS upon discharge.  

## 2023-01-19 NOTE — Discharge Instructions (Signed)
Take medications as previously prescribed.  Return to the emergency department if you develop worsening pain, difficulty breathing, or for other new and concerning symptoms.

## 2023-02-09 ENCOUNTER — Other Ambulatory Visit (HOSPITAL_COMMUNITY): Payer: Self-pay | Admitting: Cardiology

## 2023-02-09 ENCOUNTER — Encounter (HOSPITAL_COMMUNITY): Payer: Self-pay | Admitting: Cardiology

## 2023-02-09 ENCOUNTER — Ambulatory Visit (HOSPITAL_COMMUNITY)
Admission: RE | Admit: 2023-02-09 | Discharge: 2023-02-09 | Disposition: A | Discharge status: Home / Self Care | Payer: Medicare HMO | Source: Ambulatory Visit | Attending: Cardiology | Admitting: Cardiology

## 2023-02-09 ENCOUNTER — Inpatient Hospital Stay (HOSPITAL_COMMUNITY)
Admission: RE | Admit: 2023-02-09 | Discharge: 2023-02-09 | Disposition: A | Discharge status: Home / Self Care | Payer: 59 | Source: Ambulatory Visit | Attending: Cardiology | Admitting: Cardiology

## 2023-02-09 VITALS — BP 104/60 | HR 75 | Wt 176.4 lb

## 2023-02-09 DIAGNOSIS — R5383 Other fatigue: Secondary | ICD-10-CM | POA: Diagnosis not present

## 2023-02-09 DIAGNOSIS — Z7901 Long term (current) use of anticoagulants: Secondary | ICD-10-CM | POA: Insufficient documentation

## 2023-02-09 DIAGNOSIS — I480 Paroxysmal atrial fibrillation: Secondary | ICD-10-CM

## 2023-02-09 DIAGNOSIS — I428 Other cardiomyopathies: Secondary | ICD-10-CM | POA: Insufficient documentation

## 2023-02-09 DIAGNOSIS — Z79899 Other long term (current) drug therapy: Secondary | ICD-10-CM | POA: Insufficient documentation

## 2023-02-09 DIAGNOSIS — I110 Hypertensive heart disease with heart failure: Secondary | ICD-10-CM | POA: Insufficient documentation

## 2023-02-09 DIAGNOSIS — I509 Heart failure, unspecified: Secondary | ICD-10-CM | POA: Diagnosis not present

## 2023-02-09 DIAGNOSIS — I5022 Chronic systolic (congestive) heart failure: Secondary | ICD-10-CM

## 2023-02-09 DIAGNOSIS — I447 Left bundle-branch block, unspecified: Secondary | ICD-10-CM | POA: Insufficient documentation

## 2023-02-09 DIAGNOSIS — E785 Hyperlipidemia, unspecified: Secondary | ICD-10-CM | POA: Diagnosis not present

## 2023-02-09 DIAGNOSIS — R0789 Other chest pain: Secondary | ICD-10-CM | POA: Insufficient documentation

## 2023-02-09 DIAGNOSIS — D649 Anemia, unspecified: Secondary | ICD-10-CM | POA: Diagnosis not present

## 2023-02-09 LAB — CBC
HCT: 30.4 % — ABNORMAL LOW (ref 36.0–46.0)
Hemoglobin: 9.7 g/dL — ABNORMAL LOW (ref 12.0–15.0)
MCH: 29.7 pg (ref 26.0–34.0)
MCHC: 31.9 g/dL (ref 30.0–36.0)
MCV: 93 fL (ref 80.0–100.0)
Platelets: 310 10*3/uL (ref 150–400)
RBC: 3.27 MIL/uL — ABNORMAL LOW (ref 3.87–5.11)
RDW: 12.9 % (ref 11.5–15.5)
WBC: 7.7 10*3/uL (ref 4.0–10.5)
nRBC: 0 % (ref 0.0–0.2)

## 2023-02-09 LAB — IRON AND TIBC
Iron: 53 ug/dL (ref 28–170)
Saturation Ratios: 10 % — ABNORMAL LOW (ref 10.4–31.8)
TIBC: 508 ug/dL — ABNORMAL HIGH (ref 250–450)
UIBC: 455 ug/dL

## 2023-02-09 LAB — TSH: TSH: 1.177 u[IU]/mL (ref 0.350–4.500)

## 2023-02-09 LAB — FERRITIN: Ferritin: 8 ng/mL — ABNORMAL LOW (ref 11–307)

## 2023-02-09 NOTE — Patient Instructions (Signed)
No changes to medications.  Labs done today, your results will be available in MyChart, we will contact you for abnormal readings.  Your physician has requested that you have an echocardiogram. Echocardiography is a painless test that uses sound waves to create images of your heart. It provides your doctor with information about the size and shape of your heart and how well your heart's chambers and valves are working. This procedure takes approximately one hour. There are no restrictions for this procedure. Please do NOT wear cologne, perfume, aftershave, or lotions (deodorant is allowed). Please arrive 15 minutes prior to your appointment time.  Your provider has recommended that  you wear a Zio Patch for 14 days.  This monitor will record your heart rhythm for our review.  IF you have any symptoms while wearing the monitor please press the button.  If you have any issues with the patch or you notice a red or orange light on it please call the company at [phone].  Once you remove the patch please mail it back to the company as soon as possible so we can get the results.  Your physician recommends that you schedule a follow-up appointment in: 6 months  If you have any questions or concerns before your next appointment please send [place] a message through [place] or call our office at [phone].    TO LEAVE A MESSAGE FOR THE NURSE SELECT OPTION 2, PLEASE LEAVE A MESSAGE INCLUDING: YOUR NAME DATE OF BIRTH CALL BACK NUMBER REASON FOR CALL**this is important as we prioritize the call backs  YOU WILL RECEIVE A CALL BACK THE SAME DAY AS LONG AS YOU CALL BEFORE 4:00 PM  At the [facility], you and your health needs are our priority. As part of our continuing mission to provide you with exceptional heart care, we have created designated Provider Care Teams. These Care Teams include your primary Cardiologist (physician) and Advanced Practice Providers (APPs- Physician Assistants  and Nurse Practitioners) who all work together to provide you with the care you need, when you need it.   You may see any of the following providers on your designated Care Team at your next follow up: Dr Arvilla Meres Dr Marca Ancona Dr. Marcos Eke, NP Robbie Lis, [place] [employer] [place], [place] Brynda Peon, NP Karle Plumber, PharmD   Please be sure to bring in all your medications bottles to every appointment.    Thank you for choosing [facility]

## 2023-02-10 ENCOUNTER — Other Ambulatory Visit (HOSPITAL_COMMUNITY): Payer: Self-pay

## 2023-02-10 DIAGNOSIS — I5022 Chronic systolic (congestive) heart failure: Secondary | ICD-10-CM

## 2023-02-10 NOTE — Progress Notes (Signed)
Patient ID: [name], female   DOB: 07-25-1946, 77 y.o.   MRN: [id number]    [facility] Note   PCP: Dr. Tanya Nones HF: Dr. Shirlee Latch   77 y.o. with history of HTN and type II diabetes presents for followup of CHF.  Back in 2009, she had an echo with normal EF.  She was admitted in 9/13 with chest pain and LBBB.  She had LHC showing no CAD but EF 35-40%.  Echo was a technically difficult study with EF reported as 50-55%.  She has been taking ARB and Coreg.  Repeat echo in 12/13 showed EF 40% with septal hypokinesis.  Cardiac MRI was done in 12/13 as well, with calculated EF 50%, global hypokinesis, and no delayed enhancement.  Echo in 7/15 showed EF 55-60% with mild MR and mild TR (no LBBB at this time).   In 3/16, she began to develop increased dyspnea.  She was seen by Norma Fredrickson and was noted to have a LBBB again. Echo in 3/16 showed EF down to 35-40%.  She was then seen in followup by Theodore Demark.  Alivecor on her phone showed a period of HR up to 160s that appeared to be atrial fibrillation.  She felt tachypalpitations. Coreg was increased and she was started on apixaban 5 mg bid. CPX in 4/16 showed a submaximal effort but normal spirometry and normal peak VO2.  She did have ST changes possibly concerning for ischemia (but has baseline LBBB so hard to interpret).    Cardiolite in 5/16 showed on ischemia or infarction.  Most recent echo in 2/17 showed EF 45-50%, diffuse hypokinesis, grade I diastolic dysfunction, normal RV size and systolic function.  Echo 1/18 with EF stable at 45%.  Echo in 4/19 showed EF 50-55% with septal dyssynergy.   Echo in 1/21 showed EF 40-45%, normal RV.  Cardiac MRI was done in 5/21, showing LV EF 54%, septal-lateral dyssynchrony c/w LBBB, RV EF 53%, no LGE.   Patient had COVID-19 in 7/22.   Echo in 12/22 showed EF 40-45%, septal-lateral dyssynchrony, normal RV.    Patient had atrial fibrillation ablation in 4/23.  Zio monitor in 5/23 showed no  AF.  Echo in 5/23 showed EF 55-60%, paradoxical septal motion, normal RV.  Coronary CTA was done in 11/23 due to atypical chest pain, showing no significant CAD and calcium score in the 29th percentile.   Patient returns for followup of CHF and atrial fibrillation.  She still has periodic episodes of "uncomfortable sensations" in her chest pain.  These are chronic, not related to exertion.  She is worried that she may be in atrial fibrillation during these episodes.  She went to the ER with this atypical chest pain in 4/24, workup was negative with troponin normal. She has shortness of breath moderate exertion.  Weight is stable and BP is controlled.   ECG (personally reviewed): NSR, LBBB 156 msec  Labs (9/13): TSH 4.884 (elevated), proBNP 155 Labs (10/13): K 3.6, creatinine 0.8 Labs (1/14): K 3.7, creatinine 0.8, BNP not elevated, ANA weakly positive, TSH normal Labs (7/14): BNP normal Labs (11/14): K 4.1, creatinine 0.73 Labs (10/15): K 4.2, creatinine 0.73, LDL 45 Labs (3/16): BNP 46, HCT 42.2, TSH normal Labs (4/16): K 4.1, creatinine 0.78 => 0.8, HCT 39.3 Labs (1/17): K 4.6, creatinine 1.06, hgb 13.1, BNP 22 Labs (10/17): LDL 49, HDL 37, K 4.8, creatinine 4.09, TSH normal, BNP 62 Labs (4/18): LDL 48, K 4.4, creatinine 8.11 Labs (7/19): K  4.3, creatinine 1.02, 11.7  Labs (6/20): K 4.8, creatinine 0.94 Labs (8/21): K 4.8, creatinine 0.91, LDL 42 Labs (1/22): K 4.5, creaitnine 1.04 Labs (10/22): K 4.2, creatinine 1.01 Labs (9/23): K 4.4, creatinine 1.05, hgb 10.2 Labs (4/24): K 4.1, creatinine 1.10  PMH: 1. LBBB/IVCD: Now persistent.   2. Breast cancer s/p bilateral mastectomy. 3. GERD 4. Type II diabetes mellitus 5. HTN 6. Asthma 7. PE in 2001 8. Hyperlipidemia 9. Nonischemic Cardiomyopathy: Possible LBBB cardiomyopathy. Echo 8/09 with 60%.  Admitted in 9/13 with chest pain.  LHC showed no angiographic CAD and EF 35-40%.  Echo at that time was read as showing EF 50-55% but was a  technically difficult study.  Echo (12/13): EF 40% with septal hypokinesis, normal RV size and systolic function, no significant valvular abnormalities.  Cardiac MRI (12/13): EF 50%, global hypokinesis, no delayed enhancement.  Echo (7/14) with EF 50-55%, mild LV dilation, mild LAE.  Echo (7/15) with EF 55-60%, mild MR and mild TR.  Echo (3/16) with EF 35-40%, septal-lateral dyssynchrony.  CPX (4/16) with RER 0.98 (submaximal), peak VO2 19.5, VE/VCO2 slope 29.4 => normal spirometry, normal peak VO2, but ST changes suggest possibility of ischemia.  Lexiscan Cardiolite (5/16) with no ischemia/infarction. - Echo (2/17) with EF 45-50%, diffuse hypokinesis, grade I diastolic dysfunction, normal RV size and systolic function.  - Echo (1/18) with EF 45%, septal-lateral dyssynchrony, septal hypokinesis, normal RV size and systolic function.  - Echo (4/19) with EF 50-55%, mild LVH, septal-lateral dyssynchony.  - Cardiac MRI (5/21): LV EF 54%, septal-lateral dyssynchrony c/w LBBB, RV EF 53%, no LGE.  - Echo (12/22): EF 40-45%, septal-lateral dyssynchrony, normal RV - Echo (5/23): EF 55-60%, paradoxical septal motion, normal RV.  - Coronary CTA (11/23): no significant CAD and calcium score in the 29th percentile. 10. Supraclavicular lymphadenopathy: Stable by CT back to 2009, suspect benign.  11. Atrial fibrillation: Paroxysmal.  Holter (4/16) with rare PACs and PVCs, short run 5 beats SVT.  - Atrial fibrillation transiently in 1/21.  - Did not tolerate Multaq.  - Atrial fibrillation ablation in 4/23.  12. Right TKR 6/19, left TKR 1/20 13. COVID-19 7/22 14. Anemia  SH: Married, lives in [facility], never smoked.  FH: Uncle with ? SCD.  Brother with ? SCD.    ROS: All systems reviewed and negative except as per HPI.   Current Outpatient Medications  Medication Sig Dispense Refill   ACCU-CHEK AVIVA PLUS test strip USE TO CHECK BLOOD SUGAR EVERY MORNING DX: E11.9 100 strip 3   acetaminophen  (TYLENOL) 500 MG tablet Take 1,000 mg by mouth every 6 (six) hours as needed for mild pain or moderate pain.     atorvastatin (LIPITOR) 40 MG tablet TAKE 1 TABLET BY MOUTH EVERY DAY 90 tablet 3   bisoprolol (ZEBETA) 5 MG tablet Take 1/2 tablet (2.5 mg) by mouth daily. 45 tablet 3   Blood Glucose Monitoring Suppl (ACCU-CHEK AVIVA PLUS) w/Device KIT Use to check FBS QAM - DX:E11.9 1 kit 1   Cholecalciferol (VITAMIN D3) 50 MCG (2000 UT) CAPS Take 2,000 Units by mouth daily.     clobetasol ointment (TEMOVATE) 0.05 % 1 application. daily as needed (Rash).     Coenzyme Q10 (CO Q 10 PO) Take 400 mg by mouth daily.     colesevelam (WELCHOL) 625 MG tablet TAKE 3 TABLETS BY MOUTH TWICE A DAY WITH A MEAL 540 tablet 0   dexlansoprazole (DEXILANT) 60 MG capsule TAKE 1 CAPSULE BY MOUTH  EVERY DAY 90 capsule 0   diltiazem (CARDIZEM) 30 MG tablet TAKE 1 TABLET EVERY 4 HOURS AS NEEDED FOR AFIB HEART RATE >100 45 tablet 1   ELIQUIS 5 MG TABS tablet TAKE 1 TABLET BY MOUTH TWICE A DAY 180 tablet 3   famotidine (PEPCID) 40 MG tablet TAKE 1 TABLET BY MOUTH EVERY DAY 90 tablet 0   fluticasone (FLONASE) 50 MCG/ACT nasal spray Place 2 sprays into both nostrils daily as needed for allergies or rhinitis.     Ginkgo Biloba 120 MG TABS Take 120 mg by mouth daily.     Glucosamine-Chondroitin (COSAMIN DS PO) Take 1 tablet by mouth 2 (two) times a day.     Lancets (ACCU-CHEK SOFT TOUCH) lancets Use to check BS QAM DX: E11.9 100 each 3   losartan (COZAAR) 100 MG tablet TAKE 1 TABLET BY MOUTH EVERY DAY 90 tablet 3   Multiple Vitamin (MULTIVITAMIN WITH MINERALS) TABS tablet Take 1 tablet by mouth daily. Senior Multivitamin Iron Free Formula     Omega-3 Fatty Acids (OMEGA 3 PO) Take 500 mg by mouth 2 (two) times daily.     spironolactone (ALDACTONE) 25 MG tablet Take 1 tablet (25 mg total) by mouth daily. NEEDS FOLLOW UP APPOINTMENT FOR MORE REFILLS 90 tablet 3   vitamin B-12 (CYANOCOBALAMIN) 1000 MCG tablet Take 1,000 mcg by mouth  2 (two) times daily.     vitamin C (ASCORBIC ACID) 500 MG tablet Take 500 mg by mouth daily. Ultra C     zafirlukast (ACCOLATE) 20 MG tablet TAKE 1 TABLET BY MOUTH TWICE A DAY 180 tablet 4   No current facility-administered medications for this encounter.    BP 104/60   Pulse 75   Wt 80 kg (176 lb 6.4 oz)   SpO2 97%   BMI 32.26 kg/m    Wt Readings from Last 3 Encounters:  02/09/23 80 kg (176 lb 6.4 oz)  01/19/23 79.4 kg (175 lb)  11/27/22 79.8 kg (176 lb)  General: NAD Neck: No JVD, no thyromegaly or thyroid nodule.  Lungs: Clear to auscultation bilaterally with normal respiratory effort. CV: Nondisplaced PMI.  Heart regular S1/S2, no S3/S4, no murmur.  No peripheral edema.  No carotid bruit.  Normal pedal pulses.  Abdomen: Soft, nontender, no hepatosplenomegaly, no distention.  Skin: Intact without lesions or rashes.  Neurologic: Alert and oriented x 3.  Psych: Normal affect. Extremities: No clubbing or cyanosis.  HEENT: Normal.   Assessment/Plan: 1. Nonischemic cardiomyopathy:   No CAD on cath 9/13 but EF 35-40% at that time.  ECG showed LBBB.  LBBB resolved and EF normalized.  However, in 3/16, patient had fatigue and dyspnea => repeat echo with EF back down to 35-40%, she now has a persistent LBBB. This may be a LBBB cardiomyopathy.  CPX in 4/16 was submaximal but showed normal spirometry and actually near normal peak VO2.  Echo in 4/19 showed EF stable at 50-55% with septal-lateral dyssynchrony.  Cardiac MRI in 5/21 showed LV EF 54%, septal-lateral dyssynchrony c/w LBBB, RV EF 53%, no LGE.  Echo in 12/22 showed EF 40-45%, septal-lateral dyssynchrony, normal RV.  Echo in 5/23 showed EF 55-60%, paradoxical septal motion, normal RV.  Coronary CTA in 11/23 with no significant coronary disease.  NYHA class II-III, still with episodes of chest heaviness and dyspnea. She is not volume overloaded on exam.  - Continue bisoprolol 2.5 mg daily.   - She did not tolerate Entresto well,  continue losartan (EF is now up  to 55-60%).   - Continue spironolactone 25 mg daily.  - Intolerable side effects with Marcelline Deist.   - BMET/BNP today.  - I will arrange for repeat echo.  2. Hyperlipidemia:  Continue atorvastatin 40 mg qhs.  3. Atrial fibrillation: She is significantly symptomatic when in AF.  She was unable to take Multaq due to side effects.  NSR s/p atrial fibrillation ablation in 4/23.  Still with spells of chest heaviness and dyspnea.  - I will arrange for Zio monitor x 14 days to see if symptoms correspond to AF episodes.  - Continue Eliquis 5 mg bid.   4. LBBB: Persistent and may have been cause of mild cardiomyopathy.  5. Anemia: This may contribute to her dyspnea.  - Check CBC, iron studies, IV Fe if low.   Followup in 6 months with APP.   Marca Ancona 02/10/2023

## 2023-02-25 ENCOUNTER — Other Ambulatory Visit (HOSPITAL_COMMUNITY): Payer: Self-pay | Admitting: *Deleted

## 2023-02-25 ENCOUNTER — Telehealth (HOSPITAL_COMMUNITY): Payer: Self-pay

## 2023-02-25 NOTE — Telephone Encounter (Signed)
Patient scheduled and made aware

## 2023-02-25 NOTE — Telephone Encounter (Signed)
-----   Message from Linda Hedges, RN sent at 02/10/2023  8:46 AM EDT ----- Can you schedule her for Iron infusion please.order is in

## 2023-03-02 NOTE — Addendum Note (Signed)
Encounter addended by: Crissie Figures, RN on: 03/02/2023 12:23 PM  Actions taken: Imaging Exam ended

## 2023-03-03 ENCOUNTER — Telehealth (HOSPITAL_COMMUNITY): Payer: Self-pay

## 2023-03-03 ENCOUNTER — Ambulatory Visit (HOSPITAL_COMMUNITY)
Admission: RE | Admit: 2023-03-03 | Discharge: 2023-03-03 | Disposition: A | Discharge status: Home / Self Care | Payer: Medicare HMO | Source: Ambulatory Visit | Attending: Family Medicine | Admitting: Family Medicine

## 2023-03-03 DIAGNOSIS — I509 Heart failure, unspecified: Secondary | ICD-10-CM | POA: Insufficient documentation

## 2023-03-03 DIAGNOSIS — I3481 Nonrheumatic mitral (valve) annulus calcification: Secondary | ICD-10-CM | POA: Diagnosis not present

## 2023-03-03 DIAGNOSIS — I447 Left bundle-branch block, unspecified: Secondary | ICD-10-CM | POA: Diagnosis not present

## 2023-03-03 DIAGNOSIS — I110 Hypertensive heart disease with heart failure: Secondary | ICD-10-CM | POA: Insufficient documentation

## 2023-03-03 DIAGNOSIS — I5030 Unspecified diastolic (congestive) heart failure: Secondary | ICD-10-CM | POA: Diagnosis not present

## 2023-03-03 DIAGNOSIS — Z86711 Personal history of pulmonary embolism: Secondary | ICD-10-CM | POA: Insufficient documentation

## 2023-03-03 DIAGNOSIS — I5022 Chronic systolic (congestive) heart failure: Secondary | ICD-10-CM

## 2023-03-03 DIAGNOSIS — E119 Type 2 diabetes mellitus without complications: Secondary | ICD-10-CM | POA: Diagnosis not present

## 2023-03-03 LAB — ECHOCARDIOGRAM COMPLETE
Calc EF: 60.4 %
MV VTI: 2.91 cm2
S' Lateral: 3.1 cm
Single Plane A2C EF: 57.6 %
Single Plane A4C EF: 58.6 %

## 2023-03-03 NOTE — Telephone Encounter (Addendum)
Pt aware, agreeable, and verbalized understanding    ----- Message from Laurey Morale, MD sent at 03/03/2023  3:37 PM EDT ----- EF 55-60% with normal RV

## 2023-03-03 NOTE — Progress Notes (Signed)
  Echocardiogram 2D Echocardiogram has been performed.  Milda Smart 03/03/2023, 11:56 AM

## 2023-03-10 ENCOUNTER — Ambulatory Visit (HOSPITAL_COMMUNITY)
Admission: RE | Admit: 2023-03-10 | Discharge: 2023-03-10 | Disposition: A | Discharge status: Home / Self Care | Payer: Medicare HMO | Source: Ambulatory Visit | Attending: Cardiology | Admitting: Cardiology

## 2023-03-10 DIAGNOSIS — I5022 Chronic systolic (congestive) heart failure: Secondary | ICD-10-CM | POA: Diagnosis present

## 2023-03-10 MED ORDER — SODIUM CHLORIDE 0.9 % IV SOLN
510.0000 mg | Freq: Once | INTRAVENOUS | Status: AC
  Administered 2023-03-10: 510 mg via INTRAVENOUS
  Filled 2023-03-10: qty 510

## 2023-03-10 NOTE — Progress Notes (Signed)
At the end of pt's observation time from [facility] pt stated she was itching on her neck.  She had no hives, no rash, no swelling, denied chest pain and shortness of breath.  Just stated she had itching at her neck.  I said I could call her Dr and get her an order for claritin since she was allergic to benadryl but she stated she had something at home, felt like she was fine, and wanted to go.  I also asked if she would like to stay a few more minutes to be sure nothing gets worse and she again said no, that she would be fine and wanted to go.  I advised her to take a claritin or zytrec whichever she had at home for the itching when she got home, and if she noted any increase in her symptoms, rash, hives, swelling, shortness of breath or chest pain to seek medical care.  Pt verbalized understanding.

## 2023-04-08 ENCOUNTER — Other Ambulatory Visit: Payer: Self-pay | Admitting: Family Medicine

## 2023-04-09 NOTE — Telephone Encounter (Signed)
Last OV 11/27/22, within protocol.  Requested Prescriptions  Pending Prescriptions Disp Refills   colesevelam (WELCHOL) 625 MG tablet [Pharmacy Med Name: COLESEVELAM 625 MG TABLET] 540 tablet 0    Sig: TAKE 3 TABLETS BY MOUTH TWICE A DAY WITH A MEAL     Cardiovascular:  Antilipid - Bile Acid Sequestrants Failed - 04/08/2023  2:48 AM      Failed - Valid encounter within last 12 months    Recent Outpatient Visits           1 year ago Right leg pain   [facility] Tanya Nones, Priscille Heidelberg, MD   1 year ago Right sided sciatica   [facility] Tanya Nones, Priscille Heidelberg, MD   1 year ago Shortness of breath   [facility] Donita Brooks, MD   1 year ago Controlled type 2 diabetes mellitus with complication, without long-term current use of insulin (HCC)   [facility] Donita Brooks, MD   2 years ago Controlled type 2 diabetes mellitus with complication, without long-term current use of insulin (HCC)   [facility] Pickard, Priscille Heidelberg, MD              Failed - Lipid Panel in normal range within the last 12 months    Cholesterol  Date Value Ref Range Status  07/13/2022 104 0 - 200 mg/dL Final   LDL Cholesterol (Calc)  Date Value Ref Range Status  05/23/2020 42 mg/dL (calc) Final    Comment:    Reference range: <100 . Desirable range <100 mg/dL for primary prevention;   <70 mg/dL for patients with CHD or diabetic patients  with > or = 2 CHD risk factors. Marland Kitchen LDL-C is now calculated using the Martin-Hopkins  calculation, which is a validated novel method providing  better accuracy than the Friedewald equation in the  estimation of LDL-C.  Horald Pollen et al. Lenox Ahr. 2536;644(03): 2061-2068  (http://education.[facility].com/faq/FAQ164)    LDL Cholesterol  Date Value Ref Range Status  07/13/2022 47 0 - 99 mg/dL Final    Comment:           Total Cholesterol/HDL:CHD Risk Coronary Heart Disease Risk Table                      Men   Women  1/2 Average Risk   3.4   3.3  Average Risk       5.0   4.4  2 X Average Risk   9.6   7.1  3 X Average Risk  23.4   11.0        Use the calculated Patient Ratio above and the CHD Risk Table to determine the patient's CHD Risk.        ATP III CLASSIFICATION (LDL):  <100     mg/dL   Optimal  474-259  mg/dL   Near or Above                    Optimal  130-159  mg/dL   Borderline  563-875  mg/dL   High  >643     mg/dL   Very High Performed at [facility], [address], [place], [place] [address]    HDL  Date Value Ref Range Status  07/13/2022 38 (L) >40 mg/dL Final   Triglycerides  Date Value Ref Range Status  07/13/2022 93 <150 mg/dL Final

## 2023-04-10 ENCOUNTER — Other Ambulatory Visit: Payer: Self-pay | Admitting: Family Medicine

## 2023-04-12 ENCOUNTER — Encounter (HOSPITAL_COMMUNITY): Payer: Self-pay | Admitting: Physician Assistant

## 2023-04-12 ENCOUNTER — Ambulatory Visit (HOSPITAL_COMMUNITY)
Admission: RE | Admit: 2023-04-12 | Discharge: 2023-04-12 | Disposition: A | Discharge status: Home / Self Care | Payer: Medicare HMO | Source: Ambulatory Visit | Attending: Physician Assistant | Admitting: Physician Assistant

## 2023-04-12 ENCOUNTER — Other Ambulatory Visit (HOSPITAL_COMMUNITY): Payer: Self-pay

## 2023-04-12 VITALS — BP 122/56 | HR 80 | Ht 62.0 in | Wt 178.0 lb

## 2023-04-12 DIAGNOSIS — D6869 Other thrombophilia: Secondary | ICD-10-CM | POA: Diagnosis not present

## 2023-04-12 DIAGNOSIS — Z79899 Other long term (current) drug therapy: Secondary | ICD-10-CM | POA: Diagnosis not present

## 2023-04-12 DIAGNOSIS — I480 Paroxysmal atrial fibrillation: Secondary | ICD-10-CM | POA: Diagnosis not present

## 2023-04-12 DIAGNOSIS — Z7901 Long term (current) use of anticoagulants: Secondary | ICD-10-CM | POA: Insufficient documentation

## 2023-04-12 DIAGNOSIS — I5020 Unspecified systolic (congestive) heart failure: Secondary | ICD-10-CM | POA: Diagnosis not present

## 2023-04-12 DIAGNOSIS — I428 Other cardiomyopathies: Secondary | ICD-10-CM | POA: Insufficient documentation

## 2023-04-12 DIAGNOSIS — I110 Hypertensive heart disease with heart failure: Secondary | ICD-10-CM | POA: Diagnosis not present

## 2023-04-12 NOTE — Telephone Encounter (Signed)
Due to a glitch in the system the protocol does not detect the last office visit with this practice so protocol indicates a visit is needed when it is not.    LOV 11/27/2022.    Requested Prescriptions  Pending Prescriptions Disp Refills   dexlansoprazole (DEXILANT) 60 MG capsule [Pharmacy Med Name: DEXLANSOPRAZOLE DR 60 MG CAP] 90 capsule 0    Sig: TAKE 1 CAPSULE BY MOUTH EVERY DAY     Gastroenterology: Proton Pump Inhibitors Failed - 04/10/2023  9:38 AM      Failed - Valid encounter within last 12 months    Recent Outpatient Visits           1 year ago Right leg pain   [facility] Tanya Nones, Priscille Heidelberg, MD   1 year ago Right sided sciatica   [facility] Donita Brooks, MD   1 year ago Shortness of breath   [facility] Donita Brooks, MD   1 year ago Controlled type 2 diabetes mellitus with complication, without long-term current use of insulin (HCC)   [facility] Donita Brooks, MD   2 years ago Controlled type 2 diabetes mellitus with complication, without long-term current use of insulin (HCC)   [facility] Pickard, Priscille Heidelberg, MD

## 2023-04-12 NOTE — Progress Notes (Signed)
Primary Care Physician: Donita Brooks, MD Primary Cardiologist: Dr Shirlee Latch Primary Electrophysiologist: Dr Estill Dooms Referring Physician: Dr Lorane Gell [name] is a 77 y.o. female with a history of HTN, DM, systolic CHF, asthma, HLD, and paroxysmal atrial fibrillation who presents for follow up in the [facility].  The patient was initially diagnosed with atrial fibrillation 12/2014 after presenting with symptoms of increased dyspnea. Her Lourena Simmonds showed atrial fibrillation. Patient is on Eliquis for a CHADS2VASC score of 6. On 12/21/19, patient reported that she was "feeling funny" and checked her Lourena Simmonds which showed rapid afib. She took an extra dose of Coreg but her rapid rates persistent and she presented to the ER. She spontaneously converted in the ER. There were no specific triggers that she could identify. She denies alcohol use. She does have snoring and daytime somnolence. Patient is s/p afib ablation with Dr Elberta Fortis on 01/06/22.   On follow up today, patient reports that she has done well from an afib standpoint. She wore a monitor 02/2023 which showed no afib. Her Lourena Simmonds mobile has shown only SR. No bleeding issues on anticoagulation.   Today, she denies symptoms of palpitations, orthopnea, PND, lower extremity edema, dizziness, presyncope, syncope, bleeding, or neurologic sequela. The patient is tolerating medications without difficulties and is otherwise without complaint today.    Atrial Fibrillation Risk Factors:  she does have symptoms or diagnosis of sleep apnea. Declined sleep study. she does not have a history of rheumatic fever. she does not have a history of alcohol use. The patient does not have a history of early familial atrial fibrillation or other arrhythmias.   Atrial Fibrillation Management history:  Previous antiarrhythmic drugs: Multaq Previous cardioversions: none Previous ablations: 01/06/22 Anticoagulation history:  Eliquis   Past Medical History:  Diagnosis Date   Allergy 2000   Allergy history unknown    Arthritis    oa   Asthma    takes acolate for   Breast cancer (HCC) 1999; 2008   hx of bilateral breast cancer   Breast cancer, left breast (HCC) 10/27/2012   3.2 cm ER positive, Her-2 negative, 1 node positive S/P mastectomy/TTRAM reconstruction 4/08   Breast cancer, right breast (HCC) 10/27/2012   3.2 cm ER/PR positive, 1 node positive, Her-2 negative Rx w mastectomy, AC -T chemo, followed by aromatase inhibitor x 5 years dx 4/08   Cancer (HCC)    breast CA   Cataracts, bilateral    CHF (congestive heart failure) (HCC)    Dehydration after exertion    after she went berry picking in the summer heat . see ED visit    Diabetes (HCC)    DM2 (diabetes mellitus, type 2) (HCC)    on welchol for   Dyspnea    with exertion   Elevated cholesterol    GERD (gastroesophageal reflux disease)    Hiatal hernia    Hypertension    LBBB (left bundle branch block) 10/27/2012   Leg cramps    NICM (nonischemic cardiomyopathy) (HCC)    a. Echo:  06/14/12: Poor endocardial definition, possible septal HK, EF 50-55%, normal wall motion, mild LAE ;  b.  [facility] 9/13:  normal cors, EF 35-40%,  c. Echo 7/14: EF 50-55%, mild LAE   Other fatigue 01/01/2015   PONV (postoperative nausea and vomiting)    Pulmonary embolus (HCC) 2001   after tram flap surgery    ROS- All systems are reviewed and negative except as per the HPI  above.  Physical Exam: Vitals:   04/12/23 1049  BP: (!) 122/56  Pulse: 80  Weight: 80.7 kg  Height: 5\' 2"  (1.575 m)    GEN: Well nourished, well developed in no acute distress NECK: No JVD; No carotid bruits CARDIAC: Regular rate and rhythm, no murmurs, rubs, gallops RESPIRATORY:  Clear to auscultation without rales, wheezing or rhonchi  ABDOMEN: Soft, non-tender, non-distended EXTREMITIES:  No edema; No deformity    Wt Readings from Last 3 Encounters:  04/12/23 80.7 kg   02/09/23 80 kg  01/19/23 79.4 kg    EKG today demonstrates  SR, 1st degree AV block, LBBB Vent. rate 80 BPM PR interval 206 ms QRS duration 154 ms QT/QTcB 424/489 ms  Echo 02/10/22 demonstrated   1. Left ventricular ejection fraction, by estimation, is 55 to 60%. The  left ventricle has normal function. Abnormal (paradoxical) septal motion, consistent with left bundle branch block. Left ventricular diastolic parameters are indeterminate. There is mild left ventricular hypertrophy. Left ventricular diastolic parameters are indeterminate.   2. Right ventricular systolic function is normal. The right ventricular  size is normal. There is normal pulmonary artery systolic pressure. The  estimated right ventricular systolic pressure is 19.0 mmHg.   3. The mitral valve is normal in structure. No evidence of mitral valve  regurgitation. No evidence of mitral stenosis.   4. The aortic valve is tricuspid. Aortic valve regurgitation is not  visualized. No aortic stenosis is present.   5. The inferior vena cava is normal in size with greater than 50%  respiratory variability, suggesting right atrial pressure of 3 mmHg.   Epic records are reviewed at length today  CHA2DS2-VASc Score = 6  The patient's score is based upon: CHF History: 1 HTN History: 1 Diabetes History: 1 Stroke History: 0 Vascular Disease History: 0 Age Score: 2 Gender Score: 1        ASSESSMENT AND PLAN: Paroxysmal Atrial Fibrillation (ICD10:  I48.0) The patient's CHA2DS2-VASc score is 6, indicating a 9.7% annual risk of stroke.   Failed Multaq due to side effects S/p afib ablation 01/06/22 Patient appears to be maintaining SR Continue diltiazem 30 mg PRN q 4 hours for heart racing. Kardia for home monitoring.   Continue Eliquis 5 mg BID  Continue bisoprolol 2.5 mg daily  Secondary Hypercoagulable State (ICD10:  D68.69) The patient is at significant risk for stroke/thromboembolism based upon her CHA2DS2-VASc  Score of 6.  Continue Apixaban (Eliquis).   HTN Stable on current regimen  NICM EF 55-60% Fluid status appears stable Followed in the [facility]   Follow up in the [facility] as scheduled. AF clinic in one year.    Jorja Loa PA-C Afib Clinic [facility] [address] [place], [place] [address] [phone] 04/12/2023 11:09 AM

## 2023-04-13 ENCOUNTER — Other Ambulatory Visit: Payer: Self-pay | Admitting: Family Medicine

## 2023-04-13 DIAGNOSIS — R110 Nausea: Secondary | ICD-10-CM

## 2023-04-13 NOTE — Telephone Encounter (Signed)
Requested medication (s) are due for refill today: yes  Requested medication (s) are on the active medication list: yes  Last refill:  01/18/23   Future visit scheduled:no  Notes to clinic:  pt has been seen many times in past year but not addressed this specific med   Requested Prescriptions  Pending Prescriptions Disp Refills   famotidine (PEPCID) 40 MG tablet [Pharmacy Med Name: FAMOTIDINE 40 MG TABLET] 90 tablet 0    Sig: TAKE 1 TABLET BY MOUTH EVERY DAY     Gastroenterology:  H2 Antagonists Failed - 04/13/2023  8:28 AM      Failed - Valid encounter within last 12 months    Recent Outpatient Visits           1 year ago Right leg pain   [facility] Pickard, Priscille Heidelberg, MD   1 year ago Right sided sciatica   [facility] Donita Brooks, MD   1 year ago Shortness of breath   [facility] Donita Brooks, MD   1 year ago Controlled type 2 diabetes mellitus with complication, without long-term current use of insulin (HCC)   [facility] Donita Brooks, MD   2 years ago Controlled type 2 diabetes mellitus with complication, without long-term current use of insulin (HCC)   [facility] Pickard, Priscille Heidelberg, MD

## 2023-05-15 ENCOUNTER — Other Ambulatory Visit (HOSPITAL_COMMUNITY): Payer: Self-pay | Admitting: Cardiology

## 2023-06-27 ENCOUNTER — Other Ambulatory Visit: Payer: Self-pay | Admitting: Family Medicine

## 2023-06-29 NOTE — Telephone Encounter (Signed)
OV 11/27/22 Requested Prescriptions  Pending Prescriptions Disp Refills   dexlansoprazole (DEXILANT) 60 MG capsule [Pharmacy Med Name: DEXLANSOPRAZOLE DR 60 MG CAP] 90 capsule 0    Sig: TAKE 1 CAPSULE BY MOUTH EVERY DAY     Gastroenterology: Proton Pump Inhibitors Failed - 06/27/2023  8:42 AM      Failed - Valid encounter within last 12 months    Recent Outpatient Visits           1 year ago Right leg pain   [facility] Tanya Nones, Priscille Heidelberg, MD   1 year ago Right sided sciatica   [facility] Tanya Nones, Priscille Heidelberg, MD   1 year ago Shortness of breath   [facility] Donita Brooks, MD   1 year ago Controlled type 2 diabetes mellitus with complication, without long-term current use of insulin (HCC)   [facility] Donita Brooks, MD   3 years ago Controlled type 2 diabetes mellitus with complication, without long-term current use of insulin (HCC)   [facility] Pickard, Priscille Heidelberg, MD               colesevelam [facility]) 625 MG tablet [Pharmacy Med Name: COLESEVELAM 625 MG TABLET] 540 tablet 0    Sig: TAKE 3 TABLETS BY MOUTH TWICE A DAY WITH A MEAL     Cardiovascular:  Antilipid - Bile Acid Sequestrants Failed - 06/27/2023  8:42 AM      Failed - Valid encounter within last 12 months    Recent Outpatient Visits           1 year ago Right leg pain   [facility] Tanya Nones, Priscille Heidelberg, MD   1 year ago Right sided sciatica   [facility] Tanya Nones, Priscille Heidelberg, MD   1 year ago Shortness of breath   [facility] Donita Brooks, MD   1 year ago Controlled type 2 diabetes mellitus with complication, without long-term current use of insulin (HCC)   [facility] Donita Brooks, MD   3 years ago Controlled type 2 diabetes mellitus with complication, without long-term current use of insulin (HCC)   [facility] Pickard, Priscille Heidelberg, MD               Failed - Lipid Panel in normal range within the last 12 months    Cholesterol  Date Value Ref Range Status  07/13/2022 104 0 - 200 mg/dL Final   LDL Cholesterol (Calc)  Date Value Ref Range Status  05/23/2020 42 mg/dL (calc) Final    Comment:    Reference range: <100 . Desirable range <100 mg/dL for primary prevention;   <70 mg/dL for patients with CHD or diabetic patients  with > or = 2 CHD risk factors. Marland Kitchen LDL-C is now calculated using the Martin-Hopkins  calculation, which is a validated novel method providing  better accuracy than the Friedewald equation in the  estimation of LDL-C.  Horald Pollen et al. Lenox Ahr. 4098;119(14): 2061-2068  (http://education.[employer].com/faq/FAQ164)    LDL Cholesterol  Date Value Ref Range Status  07/13/2022 47 0 - 99 mg/dL Final    Comment:           Total Cholesterol/HDL:CHD Risk Coronary Heart Disease Risk Table                     Men   Women  1/2 Average Risk  3.4   3.3  Average Risk       5.0   4.4  2 X Average Risk   9.6   7.1  3 X Average Risk  23.4   11.0        Use the calculated Patient Ratio above and the CHD Risk Table to determine the patient's CHD Risk.        ATP III CLASSIFICATION (LDL):  <100     mg/dL   Optimal  161-096  mg/dL   Near or Above                    Optimal  130-159  mg/dL   Borderline  045-409  mg/dL   High  >811     mg/dL   Very High Performed at [facility], [address], [place], [place] [address]    HDL  Date Value Ref Range Status  07/13/2022 38 (L) >40 mg/dL Final   Triglycerides  Date Value Ref Range Status  07/13/2022 93 <150 mg/dL Final

## 2023-07-14 ENCOUNTER — Other Ambulatory Visit: Payer: Self-pay | Admitting: Family Medicine

## 2023-07-14 DIAGNOSIS — R110 Nausea: Secondary | ICD-10-CM

## 2023-07-14 NOTE — Telephone Encounter (Signed)
Prescription Request  07/14/2023  LOV: 11/27/2022  What is the name of the medication or equipment?   famotidine (PEPCID) 40 MG tablet  **90 day script requested**  Have you contacted your pharmacy to request a refill? Yes   Which pharmacy would you like this sent to?  [id number] [place], [place] - [address]  [address] Phone: [phone] Fax: [phone]    Patient notified that their request is being sent to the clinical staff for review and that they should receive a response within 2 business days.   Please advise pharmacist.

## 2023-07-15 MED ORDER — FAMOTIDINE 40 MG PO TABS
40.0000 mg | ORAL_TABLET | Freq: Every day | ORAL | 0 refills | Status: DC
Start: 2023-07-15 — End: 2023-10-12

## 2023-07-15 NOTE — Telephone Encounter (Signed)
Requested Prescriptions  Pending Prescriptions Disp Refills   famotidine (PEPCID) 40 MG tablet 90 tablet 0    Sig: Take 1 tablet (40 mg total) by mouth daily.     Gastroenterology:  H2 Antagonists Failed - 07/14/2023  3:57 PM      Failed - Valid encounter within last 12 months    Recent Outpatient Visits           1 year ago Right leg pain   [facility] Tanya Nones, Priscille Heidelberg, MD   1 year ago Right sided sciatica   [facility] Tanya Nones, Priscille Heidelberg, MD   1 year ago Shortness of breath   [facility] Donita Brooks, MD   2 years ago Controlled type 2 diabetes mellitus with complication, without long-term current use of insulin (HCC)   [facility] Medicine Donita Brooks, MD   3 years ago Controlled type 2 diabetes mellitus with complication, without long-term current use of insulin (HCC)   [facility] Pickard, Priscille Heidelberg, MD

## 2023-08-02 NOTE — Progress Notes (Incomplete)
Patient ID: [name], female   DOB: 1946-05-24, 77 y.o.   MRN: [id number]    Advanced Heart Failure Clinic Note   PCP: Dr. Tanya Nones HF: Dr. Shirlee Latch   77 y.o. with history of HTN and type II diabetes presents for followup of CHF.  Back in 2009, she had an echo with normal EF.  She was admitted in 9/13 with chest pain and LBBB.  She had LHC showing no CAD but EF 35-40%.  Echo was a technically difficult study with EF reported as 50-55%.  She has been taking ARB and Coreg.  Repeat echo in 12/13 showed EF 40% with septal hypokinesis.  Cardiac MRI was done in 12/13 as well, with calculated EF 50%, global hypokinesis, and no delayed enhancement.  Echo in 7/15 showed EF 55-60% with mild MR and mild TR (no LBBB at this time).   In 3/16, she began to develop increased dyspnea.  She was seen by Norma Fredrickson and was noted to have a LBBB again. Echo in 3/16 showed EF down to 35-40%.  She was then seen in followup by Theodore Demark.  Alivecor on her phone showed a period of HR up to 160s that appeared to be atrial fibrillation.  She felt tachypalpitations. Coreg was increased and she was started on apixaban 5 mg bid. CPX in 4/16 showed a submaximal effort but normal spirometry and normal peak VO2.  She did have ST changes possibly concerning for ischemia (but has baseline LBBB so hard to interpret).    Cardiolite in 5/16 showed on ischemia or infarction.  Most recent echo in 2/17 showed EF 45-50%, diffuse hypokinesis, grade I diastolic dysfunction, normal RV size and systolic function.  Echo 1/18 with EF stable at 45%.  Echo in 4/19 showed EF 50-55% with septal dyssynergy.   Echo in 1/21 showed EF 40-45%, normal RV.  Cardiac MRI was done in 5/21, showing LV EF 54%, septal-lateral dyssynchrony c/w LBBB, RV EF 53%, no LGE.   Patient had COVID-19 in 7/22.   Echo in 12/22 showed EF 40-45%, septal-lateral dyssynchrony, normal RV.    Patient had atrial fibrillation ablation in 4/23.  Zio monitor in 5/23 showed no  AF.  Echo in 5/23 showed EF 55-60%, paradoxical septal motion, normal RV.  Coronary CTA was done in 11/23 due to atypical chest pain, showing no significant CAD and calcium score in the 29th percentile.   Today she returns for AHF follow up. Overall feeling ***. Denies palpitations, CP, dizziness, edema, or PND/Orthopnea. *** SOB. Appetite ok. No fever or chills. Weight at home *** pounds. Taking all medications. ETOH, smoking ***   ECG (personally reviewed from 7/24): NSR 80, LBBB 154 msec  Labs (6/20): K 4.8, creatinine 0.94 Labs (8/21): K 4.8, creatinine 0.91, LDL 42 Labs (1/22): K 4.5, creaitnine 1.04 Labs (10/22): K 4.2, creatinine 1.01 Labs (9/23): K 4.4, creatinine 1.05, hgb 10.2 Labs (4/24): K 4.1, creatinine 1.10 Labs (5/24): tSat 10, Ferritin 8  PMH: 1. LBBB/IVCD: Now persistent.   2. Breast cancer s/p bilateral mastectomy. 3. GERD 4. Type II diabetes mellitus 5. HTN 6. Asthma 7. PE in 2001 8. Hyperlipidemia 9. Nonischemic Cardiomyopathy: Possible LBBB cardiomyopathy. Echo 8/09 with 60%.  Admitted in 9/13 with chest pain.  LHC showed no angiographic CAD and EF 35-40%.  Echo at that time was read as showing EF 50-55% but was a technically difficult study.  Echo (12/13): EF 40% with septal hypokinesis, normal RV size and systolic function, no significant valvular  abnormalities.  Cardiac MRI (12/13): EF 50%, global hypokinesis, no delayed enhancement.  Echo (7/14) with EF 50-55%, mild LV dilation, mild LAE.  Echo (7/15) with EF 55-60%, mild MR and mild TR.  Echo (3/16) with EF 35-40%, septal-lateral dyssynchrony.  CPX (4/16) with RER 0.98 (submaximal), peak VO2 19.5, VE/VCO2 slope 29.4 => normal spirometry, normal peak VO2, but ST changes suggest possibility of ischemia.  Lexiscan Cardiolite (5/16) with no ischemia/infarction. - Echo (2/17) with EF 45-50%, diffuse hypokinesis, grade I diastolic dysfunction, normal RV size and systolic function.  - Echo (1/18) with EF 45%,  septal-lateral dyssynchrony, septal hypokinesis, normal RV size and systolic function.  - Echo (4/19) with EF 50-55%, mild LVH, septal-lateral dyssynchony.  - Cardiac MRI (5/21): LV EF 54%, septal-lateral dyssynchrony c/w LBBB, RV EF 53%, no LGE.  - Echo (12/22): EF 40-45%, septal-lateral dyssynchrony, normal RV - Echo (5/23): EF 55-60%, paradoxical septal motion, normal RV.  - Coronary CTA (11/23): no significant CAD and calcium score in the 29th percentile. 10. Supraclavicular lymphadenopathy: Stable by CT back to 2009, suspect benign.  11. Atrial fibrillation: Paroxysmal.  Holter (4/16) with rare PACs and PVCs, short run 5 beats SVT.  - Atrial fibrillation transiently in 1/21.  - Did not tolerate Multaq.  - Atrial fibrillation ablation in 4/23.  12. Right TKR 6/19, left TKR 1/20 13. COVID-19 7/22 14. Anemia  SH: Married, lives in [facility], never smoked.  FH: Uncle with ? SCD.  Brother with ? SCD.    ROS: All systems reviewed and negative except as per HPI.   Current Outpatient Medications  Medication Sig Dispense Refill   losartan (COZAAR) 100 MG tablet TAKE 1 TABLET BY MOUTH EVERY DAY 90 tablet 1   ACCU-CHEK AVIVA PLUS test strip USE TO CHECK BLOOD SUGAR EVERY MORNING DX: E11.9 100 strip 3   acetaminophen (TYLENOL) 500 MG tablet Take 1,000 mg by mouth every 6 (six) hours as needed for mild pain or moderate pain.     atorvastatin (LIPITOR) 40 MG tablet TAKE 1 TABLET BY MOUTH EVERY DAY 90 tablet 3   bisoprolol (ZEBETA) 5 MG tablet Take 1/2 tablet (2.5 mg) by mouth daily. 45 tablet 3   Blood Glucose Monitoring Suppl (ACCU-CHEK AVIVA PLUS) w/Device KIT Use to check FBS QAM - DX:E11.9 1 kit 1   Cholecalciferol (VITAMIN D3) 50 MCG (2000 UT) CAPS Take 2,000 Units by mouth daily.     clobetasol ointment (TEMOVATE) 0.05 % 1 application. daily as needed (Rash).     Coenzyme Q10 (CO Q 10 PO) Take 400 mg by mouth daily.     colesevelam (WELCHOL) 625 MG tablet TAKE 3 TABLETS BY  MOUTH TWICE A DAY WITH A MEAL 540 tablet 0   dexlansoprazole (DEXILANT) 60 MG capsule TAKE 1 CAPSULE BY MOUTH EVERY DAY 90 capsule 0   diltiazem (CARDIZEM) 30 MG tablet TAKE 1 TABLET EVERY 4 HOURS AS NEEDED FOR AFIB HEART RATE >100 45 tablet 1   ELIQUIS 5 MG TABS tablet TAKE 1 TABLET BY MOUTH TWICE A DAY 180 tablet 3   famotidine (PEPCID) 40 MG tablet Take 1 tablet (40 mg total) by mouth daily. 90 tablet 0   fluticasone (FLONASE) 50 MCG/ACT nasal spray Place 2 sprays into both nostrils daily as needed for allergies or rhinitis.     Ginkgo Biloba 120 MG TABS Take 120 mg by mouth daily.     Glucosamine-Chondroitin (COSAMIN DS PO) Take 1 tablet by mouth 2 (two) times a day.  Lancets (ACCU-CHEK SOFT TOUCH) lancets Use to check BS QAM DX: E11.9 100 each 3   Multiple Vitamin (MULTIVITAMIN WITH MINERALS) TABS tablet Take 1 tablet by mouth daily. Senior Multivitamin Iron Free Formula     Omega-3 Fatty Acids (OMEGA 3 PO) Take 500 mg by mouth 2 (two) times daily.     spironolactone (ALDACTONE) 25 MG tablet Take 1 tablet (25 mg total) by mouth daily. NEEDS FOLLOW UP APPOINTMENT FOR MORE REFILLS 90 tablet 3   vitamin B-12 (CYANOCOBALAMIN) 1000 MCG tablet Take 1,000 mcg by mouth 2 (two) times daily.     vitamin C (ASCORBIC ACID) 500 MG tablet Take 500 mg by mouth daily. Ultra C     zafirlukast (ACCOLATE) 20 MG tablet TAKE 1 TABLET BY MOUTH TWICE A DAY 180 tablet 4   No current facility-administered medications for this visit.    There were no vitals taken for this visit.   Wt Readings from Last 3 Encounters:  04/12/23 80.7 kg (178 lb)  02/09/23 80 kg (176 lb 6.4 oz)  01/19/23 79.4 kg (175 lb)   Physical Exam:  General:  *** appearing.  No respiratory difficulty HEENT: normal Neck: supple. JVD *** cm. Carotids 2+ bilat; no bruits. No lymphadenopathy or thyromegaly appreciated. Cor: PMI nondisplaced. Regular rate & rhythm. No rubs, gallops or murmurs. Lungs: clear Abdomen: soft, nontender,  nondistended. No hepatosplenomegaly. No bruits or masses. Good bowel sounds. Extremities: no cyanosis, clubbing, rash, edema  Neuro: alert & oriented x 3, cranial nerves grossly intact. moves all 4 extremities w/o difficulty. Affect pleasant.   Assessment/Plan: 1. Nonischemic cardiomyopathy:   No CAD on cath 9/13 but EF 35-40% at that time.  ECG showed LBBB.  LBBB resolved and EF normalized.  However, in 3/16, patient had fatigue and dyspnea => repeat echo with EF back down to 35-40%, she now has a persistent LBBB. This may be a LBBB cardiomyopathy.  CPX in 4/16 was submaximal but showed normal spirometry and actually near normal peak VO2.  Echo in 4/19 showed EF stable at 50-55% with septal-lateral dyssynchrony.  Cardiac MRI in 5/21 showed LV EF 54%, septal-lateral dyssynchrony c/w LBBB, RV EF 53%, no LGE.  Echo in 12/22 showed EF 40-45%, septal-lateral dyssynchrony, normal RV.  Echo in 5/23 showed EF 55-60%, paradoxical septal motion, normal RV.  Coronary CTA in 11/23 with no significant coronary disease.  NYHA class II-III, still with episodes of chest heaviness and dyspnea. She is not volume overloaded on exam. *** - Continue bisoprolol 2.5 mg daily.   - She did not tolerate Entresto well, continue losartan (EF is now up to 55-60%).   - Continue spironolactone 25 mg daily.  - Intolerable side effects with Marcelline Deist.   - BMET/BNP today.  - I will arrange for repeat echo. *** 2. Hyperlipidemia:  Continue atorvastatin 40 mg qhs.  3. Atrial fibrillation: She is significantly symptomatic when in AF.  She was unable to take Multaq due to side effects.  NSR s/p atrial fibrillation ablation in 4/23.  Still with spells of chest heaviness and dyspnea. *** - Zio 5/24: Rare short SVT runs.  - Continue Eliquis 5 mg bid.   4. LBBB: Persistent and may have been cause of mild cardiomyopathy.  5. Anemia: This may contribute to her dyspnea.  - Ferritin 8, tSat 10, s/p feraheme 6/24  Followup in 6 months with Dr.  Shirlee Latch. ***  Alen Bleacher AGACNP-BC  08/02/2023 Advanced Heart Failure Clinic [place] [address] [facility] [place] [address] [phone] (office) [phone] (fax)

## 2023-08-12 ENCOUNTER — Encounter (HOSPITAL_COMMUNITY): Payer: Self-pay

## 2023-08-12 ENCOUNTER — Telehealth (HOSPITAL_COMMUNITY): Payer: Self-pay

## 2023-08-12 ENCOUNTER — Other Ambulatory Visit (HOSPITAL_COMMUNITY): Payer: Self-pay

## 2023-08-12 ENCOUNTER — Ambulatory Visit (HOSPITAL_COMMUNITY)
Admission: RE | Admit: 2023-08-12 | Discharge: 2023-08-12 | Disposition: A | Discharge status: Home / Self Care | Payer: 59 | Source: Ambulatory Visit | Attending: Internal Medicine | Admitting: Internal Medicine

## 2023-08-12 VITALS — BP 112/62 | HR 76 | Wt 177.6 lb

## 2023-08-12 DIAGNOSIS — I5022 Chronic systolic (congestive) heart failure: Secondary | ICD-10-CM

## 2023-08-12 DIAGNOSIS — E119 Type 2 diabetes mellitus without complications: Secondary | ICD-10-CM | POA: Insufficient documentation

## 2023-08-12 DIAGNOSIS — I110 Hypertensive heart disease with heart failure: Secondary | ICD-10-CM | POA: Insufficient documentation

## 2023-08-12 DIAGNOSIS — I480 Paroxysmal atrial fibrillation: Secondary | ICD-10-CM | POA: Diagnosis not present

## 2023-08-12 DIAGNOSIS — E785 Hyperlipidemia, unspecified: Secondary | ICD-10-CM | POA: Insufficient documentation

## 2023-08-12 DIAGNOSIS — I509 Heart failure, unspecified: Secondary | ICD-10-CM | POA: Insufficient documentation

## 2023-08-12 DIAGNOSIS — D649 Anemia, unspecified: Secondary | ICD-10-CM | POA: Insufficient documentation

## 2023-08-12 DIAGNOSIS — I447 Left bundle-branch block, unspecified: Secondary | ICD-10-CM | POA: Diagnosis not present

## 2023-08-12 DIAGNOSIS — I428 Other cardiomyopathies: Secondary | ICD-10-CM | POA: Insufficient documentation

## 2023-08-12 DIAGNOSIS — Z7901 Long term (current) use of anticoagulants: Secondary | ICD-10-CM | POA: Diagnosis not present

## 2023-08-12 DIAGNOSIS — Z79899 Other long term (current) drug therapy: Secondary | ICD-10-CM | POA: Insufficient documentation

## 2023-08-12 DIAGNOSIS — I4891 Unspecified atrial fibrillation: Secondary | ICD-10-CM | POA: Diagnosis not present

## 2023-08-12 LAB — BASIC METABOLIC PANEL
Anion gap: 9 (ref 5–15)
BUN: 25 mg/dL — ABNORMAL HIGH (ref 8–23)
CO2: 23 mmol/L (ref 22–32)
Calcium: 9.3 mg/dL (ref 8.9–10.3)
Chloride: 108 mmol/L (ref 98–111)
Creatinine, Ser: 1.06 mg/dL — ABNORMAL HIGH (ref 0.44–1.00)
GFR, Estimated: 53 mL/min — ABNORMAL LOW (ref >60)
Glucose, Bld: 94 mg/dL (ref 70–99)
Potassium: 4.4 mmol/L (ref 3.5–5.1)
Sodium: 140 mmol/L (ref 135–145)

## 2023-08-12 LAB — CBC
HCT: 31.4 % — ABNORMAL LOW (ref 36.0–46.0)
Hemoglobin: 10.2 g/dL — ABNORMAL LOW (ref 12.0–15.0)
MCH: 30.2 pg (ref 26.0–34.0)
MCHC: 32.5 g/dL (ref 30.0–36.0)
MCV: 92.9 fL (ref 80.0–100.0)
Platelets: 337 10*3/uL (ref 150–400)
RBC: 3.38 MIL/uL — ABNORMAL LOW (ref 3.87–5.11)
RDW: 12.7 % (ref 11.5–15.5)
WBC: 6.1 10*3/uL (ref 4.0–10.5)
nRBC: 0 % (ref 0.0–0.2)

## 2023-08-12 LAB — IRON AND TIBC
Iron: 57 ug/dL (ref 28–170)
Saturation Ratios: 12 % (ref 10.4–31.8)
TIBC: 482 ug/dL — ABNORMAL HIGH (ref 250–450)
UIBC: 425 ug/dL

## 2023-08-12 LAB — BRAIN NATRIURETIC PEPTIDE: B Natriuretic Peptide: 28.5 pg/mL (ref 0.0–100.0)

## 2023-08-12 LAB — FERRITIN: Ferritin: 10 ng/mL — ABNORMAL LOW (ref 11–307)

## 2023-08-12 NOTE — Patient Instructions (Signed)
Labs done today. We will contact you only if your labs are abnormal.  No medication changes were made. Please continue all current medications as prescribed.  Your physician recommends that you schedule a follow-up appointment in: 6 months with Dr. Shirlee Latch. Please contact our office in March 2025 to schedule a May 2025 appointment.   If you have any questions or concerns before your next appointment please send [place] a message through [place] or call our office at [phone].    TO LEAVE A MESSAGE FOR THE NURSE SELECT OPTION 2, PLEASE LEAVE A MESSAGE INCLUDING: YOUR NAME DATE OF BIRTH CALL BACK NUMBER REASON FOR CALL**this is important as we prioritize the call backs  YOU WILL RECEIVE A CALL BACK THE SAME DAY AS LONG AS YOU CALL BEFORE 4:00 PM   Do the following things EVERYDAY: Weigh yourself in the morning before breakfast. Write it down and keep it in a log. Take your medicines as prescribed Eat low salt foods--Limit salt (sodium) to 2000 mg per day.  Stay as active as you can everyday Limit all fluids for the day to less than 2 liters   At the [facility], you and your health needs are our priority. As part of our continuing mission to provide you with exceptional heart care, we have created designated Provider Care Teams. These Care Teams include your primary Cardiologist (physician) and Advanced Practice Providers (APPs- Physician Assistants and Nurse Practitioners) who all work together to provide you with the care you need, when you need it.   You may see any of the following providers on your designated Care Team at your next follow up: Dr Arvilla Meres Dr Marca Ancona Dr. Marcos Eke, NP Robbie Lis, [place] [facility] [place], [place] Brynda Peon, NP Karle Plumber, PharmD   Please be sure to bring in all your medications bottles to every appointment.    Thank you for choosing [facility]

## 2023-08-12 NOTE — Addendum Note (Signed)
Encounter addended by: Alen Bleacher, NP on: 08/12/2023 2:59 PM  Actions taken: Problem List reviewed, Medication List reviewed, Allergies reviewed

## 2023-08-12 NOTE — Telephone Encounter (Addendum)
Pt aware, agreeable, and verbalized understanding  Informed her that Philicia will be the person calling her to get her infusion scheduled. Orders are in   ----- Message from Alen Bleacher sent at 08/12/2023  3:03 PM EST ----- Iron stores low. Please arrange for IV iron. Otherwise labs stable.

## 2023-08-24 ENCOUNTER — Ambulatory Visit (HOSPITAL_COMMUNITY)
Admission: RE | Admit: 2023-08-24 | Discharge: 2023-08-24 | Disposition: A | Discharge status: Home / Self Care | Payer: Medicare HMO | Source: Ambulatory Visit | Attending: Cardiology | Admitting: Cardiology

## 2023-08-24 DIAGNOSIS — I5022 Chronic systolic (congestive) heart failure: Secondary | ICD-10-CM | POA: Insufficient documentation

## 2023-08-24 MED ORDER — SODIUM CHLORIDE 0.9 % IV SOLN
510.0000 mg | Freq: Once | INTRAVENOUS | Status: AC
  Administered 2023-08-24: 510 mg via INTRAVENOUS
  Filled 2023-08-24: qty 510

## 2023-08-24 MED ORDER — FERUMOXYTOL INJECTION 510 MG/17 ML: 510.0000 mg | Freq: Once | INTRAVENOUS | Status: DC

## 2023-09-18 ENCOUNTER — Other Ambulatory Visit (HOSPITAL_COMMUNITY): Payer: Self-pay | Admitting: Cardiology

## 2023-09-27 ENCOUNTER — Other Ambulatory Visit: Payer: Self-pay

## 2023-09-27 NOTE — Telephone Encounter (Signed)
Prescription Request  09/27/2023  LOV: 11/27/22  What is the name of the medication or equipment? colesevelam [facility]) 625 MG tablet [[id number]]   Have you contacted your pharmacy to request a refill? No   Which pharmacy would you like this sent to?  [id number] [place], [place] - [address] [place] [address] Phone: [phone] Fax: [phone]    Patient notified that their request is being sent to the clinical staff for review and that they should receive a response within 2 business days.   Please advise at [facility] [phone]

## 2023-09-28 NOTE — Telephone Encounter (Signed)
Requested medication (s) are due for refill today: yes  Requested medication (s) are on the active medication list: yes  Last refill:  06/29/23 #540  Future visit scheduled: no  Notes to clinic:  overdue lab work   Requested Prescriptions  Pending Prescriptions Disp Refills   colesevelam (WELCHOL) 625 MG tablet 540 tablet 0    Sig: TAKE 3 TABLETS BY MOUTH TWICE A DAY WITH A MEAL     Cardiovascular:  Antilipid - Bile Acid Sequestrants Failed - 09/28/2023  2:07 PM      Failed - Valid encounter within last 12 months    Recent Outpatient Visits           1 year ago Right leg pain   [facility] Tanya Nones, Priscille Heidelberg, MD   1 year ago Right sided sciatica   [facility] Tanya Nones, Priscille Heidelberg, MD   1 year ago Shortness of breath   [facility] Donita Brooks, MD   2 years ago Controlled type 2 diabetes mellitus with complication, without long-term current use of insulin (HCC)   [facility] Donita Brooks, MD   3 years ago Controlled type 2 diabetes mellitus with complication, without long-term current use of insulin (HCC)   [facility] Pickard, Priscille Heidelberg, MD              Failed - Lipid Panel in normal range within the last 12 months    Cholesterol  Date Value Ref Range Status  07/13/2022 104 0 - 200 mg/dL Final   LDL Cholesterol (Calc)  Date Value Ref Range Status  05/23/2020 42 mg/dL (calc) Final    Comment:    Reference range: <100 . Desirable range <100 mg/dL for primary prevention;   <70 mg/dL for patients with CHD or diabetic patients  with > or = 2 CHD risk factors. Marland Kitchen LDL-C is now calculated using the Martin-Hopkins  calculation, which is a validated novel method providing  better accuracy than the Friedewald equation in the  estimation of LDL-C.  Horald Pollen et al. Lenox Ahr. 1610;960(45): 2061-2068  (http://education.[facility].com/faq/FAQ164)    LDL Cholesterol  Date Value Ref  Range Status  07/13/2022 47 0 - 99 mg/dL Final    Comment:           Total Cholesterol/HDL:CHD Risk Coronary Heart Disease Risk Table                     Men   Women  1/2 Average Risk   3.4   3.3  Average Risk       5.0   4.4  2 X Average Risk   9.6   7.1  3 X Average Risk  23.4   11.0        Use the calculated Patient Ratio above and the CHD Risk Table to determine the patient's CHD Risk.        ATP III CLASSIFICATION (LDL):  <100     mg/dL   Optimal  409-811  mg/dL   Near or Above                    Optimal  130-159  mg/dL   Borderline  914-782  mg/dL   High  >956     mg/dL   Very High Performed at [facility], [address] [place], [place] [address]    HDL  Date Value Ref Range Status  07/13/2022 38 (L) >40 mg/dL Final   Triglycerides  Date Value Ref Range Status  07/13/2022 93 <150 mg/dL Final

## 2023-09-28 NOTE — Telephone Encounter (Signed)
Pt needs appointment

## 2023-10-10 ENCOUNTER — Other Ambulatory Visit: Payer: Self-pay | Admitting: Family Medicine

## 2023-10-10 DIAGNOSIS — R110 Nausea: Secondary | ICD-10-CM

## 2023-11-02 ENCOUNTER — Telehealth: Payer: Self-pay | Admitting: Family Medicine

## 2023-11-02 MED ORDER — COLESEVELAM HCL 625 MG PO TABS: ORAL_TABLET | ORAL | 0 refills | Status: DC

## 2023-11-02 NOTE — Addendum Note (Signed)
Addended by: Arta Silence on: 11/02/2023 04:17 PM   Modules accepted: Orders

## 2023-11-02 NOTE — Telephone Encounter (Signed)
Copied from CRM [id number]. Topic: Clinical - Prescription Issue >> Nov 02, 2023  3:51 PM Gildardo Pounds wrote: Reason for CRM: Patient needs colesevelam (WELCHOL) 625 MG tablet doxycycline (VIBRAMYCIN) 100 MG capsule called in to pharmacy. Callback number is [phone]

## 2023-11-02 NOTE — Telephone Encounter (Signed)
Erroneous encounter. Please disregard.

## 2023-11-02 NOTE — Telephone Encounter (Signed)
Copied from CRM [id number]. Topic: Clinical - Medication Refill >> Nov 02, 2023  3:49 PM Gildardo Pounds wrote: Most Recent Primary Care Visit:  Provider: [facility]  Department: [facility]  Visit Type: LAB  Date: 12/11/2022  Medication: colesevelam (WELCHOL) 625 MG tablet doxycycline (VIBRAMYCIN) 100 MG capsule  Has the patient contacted their pharmacy? Yes (Agent: If no, request that the patient contact the pharmacy for the refill. If patient does not wish to contact the pharmacy document the reason why and proceed with request.) (Agent: If yes, when and what did the pharmacy advise?)  Is this the correct pharmacy for this prescription? Yes If no, delete pharmacy and type the correct one.  This is the patient's preferred pharmacy:  [facility] [address] [place] - [address] [place] [address] Phone: [phone] Fax: [phone]   Has the prescription been filled recently? No  Is the patient out of the medication? Yes  Has the patient been seen for an appointment in the last year OR does the patient have an upcoming appointment? Yes  Can we respond through MyChart? No  Agent: Please be advised that Rx refills may take up to 3 business days. We ask that you follow-up with your pharmacy.

## 2023-11-04 ENCOUNTER — Telehealth: Payer: Self-pay | Admitting: Family Medicine

## 2023-11-04 MED ORDER — DEXLANSOPRAZOLE 60 MG PO CPDR: 60.0000 mg | DELAYED_RELEASE_CAPSULE | Freq: Every day | ORAL | 0 refills | Status: DC

## 2023-11-04 NOTE — Telephone Encounter (Signed)
Prescription Request  11/04/2023  LOV: 11/27/2022  What is the name of the medication or equipment?   dexlansoprazole (DEXILANT) 60 MG capsule  **Original script was for 90 days**  Have you contacted your pharmacy to request a refill? Yes   Which pharmacy would you like this sent to?  [id number] [address], [place] - [address] [place] [address] Phone: [phone] Fax: [phone]    Patient notified that their request is being sent to the clinical staff for review and that they should receive a response within 2 business days.   Please advise pharmacist.

## 2023-12-05 ENCOUNTER — Other Ambulatory Visit: Payer: Self-pay | Admitting: Family Medicine

## 2023-12-05 ENCOUNTER — Other Ambulatory Visit (HOSPITAL_COMMUNITY): Payer: Self-pay | Admitting: Internal Medicine

## 2024-01-02 ENCOUNTER — Other Ambulatory Visit: Payer: Self-pay | Admitting: Family Medicine

## 2024-01-03 ENCOUNTER — Other Ambulatory Visit: Payer: Self-pay | Admitting: Family Medicine

## 2024-01-03 NOTE — Telephone Encounter (Signed)
 Copied from CRM [id number]. Topic: Clinical - Medication Refill >> Jan 03, 2024 10:26 AM Clayton Bibles wrote: Most Recent Primary Care Visit:  Provider: [facility]  [facility]  Visit Type: LAB  Date: 12/11/2022  Medication: zafirlukast (ACCOLATE) 20 MG tablet  Has the patient contacted their pharmacy? Yes (Agent: If no, request that the patient contact the pharmacy for the refill. If patient does not wish to contact the pharmacy document the reason why and proceed with request.) (Agent: If yes, when and what did the pharmacy advise?)  Pharmacy needs an order to refill this medication. Pharmacy requested this order on 01/02/24  Is this the correct pharmacy for this prescription? Yes If no, delete pharmacy and type the correct one.  This is the patient's preferred pharmacy:  [facility] [address], [place] - [address] [place] [address] Phone: [phone] Fax: [phone]   Has the prescription been filled recently? No  Is the patient out of the medication? Yes - She has been out of medication since this past Saturday.  Has the patient been seen for an appointment in the last year OR does the patient have an upcoming appointment? Yes  Can we respond through MyChart? Yes  Agent: Please be advised that Rx refills may take up to 3 business days. We ask that you follow-up with your pharmacy.

## 2024-01-04 ENCOUNTER — Telehealth: Payer: Self-pay

## 2024-01-04 ENCOUNTER — Other Ambulatory Visit: Payer: Self-pay

## 2024-01-04 DIAGNOSIS — R0602 Shortness of breath: Secondary | ICD-10-CM

## 2024-01-04 DIAGNOSIS — R110 Nausea: Secondary | ICD-10-CM

## 2024-01-04 DIAGNOSIS — J4530 Mild persistent asthma, uncomplicated: Secondary | ICD-10-CM

## 2024-01-04 MED ORDER — FAMOTIDINE 40 MG PO TABS: 40.0000 mg | ORAL_TABLET | Freq: Every day | ORAL | 0 refills | Status: DC

## 2024-01-04 MED ORDER — ZAFIRLUKAST 20 MG PO TABS: 20.0000 mg | ORAL_TABLET | Freq: Two times a day (BID) | ORAL | 4 refills | Status: DC

## 2024-01-04 NOTE — Telephone Encounter (Signed)
 Copied from CRM [id number]. Topic: Clinical - Medication Refill >> Jan 03, 2024  4:38 PM Louie Casa B wrote: Most Recent Primary Care Visit:  Provider: [facility]  [facility]  Visit Type: LAB  Date: 12/11/2022  Medication: zafirlukast (ACCOLATE) 20 MG tablet  Has the patient contacted their pharmacy? Yes (Agent: If no, request that the patient contact the pharmacy for the refill. If patient does not wish to contact the pharmacy document the reason why and proceed with request.) (Agent: If yes, when and what did the pharmacy advise?)  Is this the correct pharmacy for this prescription? Yes If no, delete pharmacy and type the correct one.  This is the patient's preferred pharmacy:  [facility] [address] [place] - [address] [place] [address] Phone: [phone] Fax: [phone]   Has the prescription been filled recently? Yes  Is the patient out of the medication? Yes  Has the patient been seen for an appointment in the last year OR does the patient have an upcoming appointment? Yes  Can we respond through MyChart? No  Agent: Please be advised that Rx refills may take up to 3 business days. We ask that you follow-up with your pharmacy. >> Jan 04, 2024 11:17 AM Truddie Crumble wrote: Patient husband called stating he is checking on the patient medication. Patient stated someone told him that the medication was going to be sent to the pharmacy but he called the pharmacy twice and it was not there. Patient stated he does not know who is dropping the d** ball but he need to talk to the office manager or the doctor because he is having problems also with his medication. Patient husband wants to know what is happening because something is wrong and it need to be fixed. Patient husband stated he is tired of it. Patient husband stated every ninety days it is something not being refilled and if has to go the [place] head man then he will.  Patient husband would like to be called

## 2024-01-04 NOTE — Telephone Encounter (Signed)
 Prescription Request  01/04/2024  LOV: 11/27/22  What is the name of the medication or equipment? famotidine (PEPCID) 40 MG tablet [[id number]]   Have you contacted your pharmacy to request a refill? Yes   Which pharmacy would you like this sent to?  [id number] [address], [place] - [address] [place] [address] Phone: [phone] Fax: [phone]    Patient notified that their request is being sent to the clinical staff for review and that they should receive a response within 2 business days.   Please advise at [facility] [phone]

## 2024-01-04 NOTE — Telephone Encounter (Signed)
 Requested by interface surescripts. Duplicate request. Receipt confirmed by pharmacy 01/04/24 at 11:35 am . Patient has not picked up Rx at this time.  Requested Prescriptions  Refused Prescriptions Disp Refills   zafirlukast (ACCOLATE) 20 MG tablet [Pharmacy Med Name: ZAFIRLUKAST 20 MG TABLET] 180 tablet 4    Sig: TAKE 1 TABLET BY MOUTH TWICE A DAY     Pulmonology:  Leukotriene Inhibitors Failed - 01/04/2024  2:41 PM      Failed - Valid encounter within last 12 months    Recent Outpatient Visits           1 year ago Elevated LFTs   [place] [facility] Pickard, Priscille Heidelberg, MD   1 year ago Polyarthralgia   [place] [facility] Donita Brooks, MD   1 year ago Diarrhea, unspecified type   [place] [facility] Donita Brooks, MD   1 year ago Rib pain on right side   [place] [facility] Donita Brooks, MD   1 year ago Nausea   [place] [facility] Pickard, Priscille Heidelberg, MD

## 2024-01-04 NOTE — Telephone Encounter (Signed)
 Called pharmacy to verify receipt was confirmed on 01/04/24 at 11:35 am . Patient has not picked up Rx at this time.

## 2024-01-05 NOTE — Telephone Encounter (Signed)
 Last RF dexilant: 11/04/23 #90 Welchol: 11/02/23 #540  Requested Prescriptions  Refused Prescriptions Disp Refills   dexlansoprazole (DEXILANT) 60 MG capsule [Pharmacy Med Name: DEXLANSOPRAZOLE DR 60 MG CAP] 90 capsule 0    Sig: TAKE 1 CAPSULE BY MOUTH EVERY DAY     Gastroenterology: Proton Pump Inhibitors Failed - 01/05/2024  3:37 PM      Failed - Valid encounter within last 12 months    Recent Outpatient Visits           1 year ago Elevated LFTs   [place] [facility] Pickard, Priscille Heidelberg, MD   1 year ago Polyarthralgia   [place] [facility] Tanya Nones, Priscille Heidelberg, MD   1 year ago Diarrhea, unspecified type   [place] [facility] Donita Brooks, MD   1 year ago Rib pain on right side   [place] [facility] Tanya Nones, Priscille Heidelberg, MD   1 year ago Nausea   [place] [facility] Tanya Nones, Priscille Heidelberg, MD               colesevelam [facility]) 625 MG tablet [Pharmacy Med Name: COLESEVELAM 625 MG TABLET] 540 tablet 0    Sig: TAKE 3 TABLETS BY MOUTH TWICE A DAY WITH A MEAL     Cardiovascular:  Antilipid - Bile Acid Sequestrants Failed - 01/05/2024  3:37 PM      Failed - Valid encounter within last 12 months    Recent Outpatient Visits           1 year ago Elevated LFTs   [place] [facility] Donita Brooks, MD   1 year ago Polyarthralgia   [place] [facility] Tanya Nones, Priscille Heidelberg, MD   1 year ago Diarrhea, unspecified type   [place] [facility] Donita Brooks, MD   1 year ago Rib pain on right side   [place] [facility] Tanya Nones, Priscille Heidelberg, MD   1 year ago Nausea   [place] [facility] Donita Brooks, MD              Failed - Lipid Panel in normal range within the last 12 months    Cholesterol  Date Value Ref Range Status  07/13/2022 104 0 - 200 mg/dL Final   LDL Cholesterol  (Calc)  Date Value Ref Range Status  05/23/2020 42 mg/dL (calc) Final    Comment:    Reference range: <100 . Desirable range <100 mg/dL for primary prevention;   <70 mg/dL for patients with CHD or diabetic patients  with > or = 2 CHD risk factors. Marland Kitchen LDL-C is now calculated using the Martin-Hopkins  calculation, which is a validated novel method providing  better accuracy than the Friedewald equation in the  estimation of LDL-C.  Horald Pollen et al. Lenox Ahr. 1610;960(45): 2061-2068  (http://education.[facility].com/faq/FAQ164)    LDL Cholesterol  Date Value Ref Range Status  07/13/2022 47 0 - 99 mg/dL Final    Comment:           Total Cholesterol/HDL:CHD Risk Coronary Heart Disease Risk Table                     Men   Women  1/2 Average Risk   3.4   3.3  Average Risk       5.0   4.4  2 X Average Risk  9.6   7.1  3 X Average Risk  23.4   11.0        Use the calculated Patient Ratio above and the CHD Risk Table to determine the patient's CHD Risk.        ATP III CLASSIFICATION (LDL):  <100     mg/dL   Optimal  295-621  mg/dL   Near or Above                    Optimal  130-159  mg/dL   Borderline  308-657  mg/dL   High  >846     mg/dL   Very High Performed at [facility], [address], [place], [place] [address]    HDL  Date Value Ref Range Status  07/13/2022 38 (L) >40 mg/dL Final   Triglycerides  Date Value Ref Range Status  07/13/2022 93 <150 mg/dL Final

## 2024-01-05 NOTE — Telephone Encounter (Signed)
 Refilled 01/04/24. Requested Prescriptions  Refused Prescriptions Disp Refills   zafirlukast (ACCOLATE) 20 MG tablet 180 tablet 4    Sig: Take 1 tablet (20 mg total) by mouth 2 (two) times daily.     Pulmonology:  Leukotriene Inhibitors Failed - 01/05/2024  9:51 AM      Failed - Valid encounter within last 12 months    Recent Outpatient Visits           1 year ago Elevated LFTs   [place] [facility] Pickard, Priscille Heidelberg, MD   1 year ago Polyarthralgia   [place] [facility] Tanya Nones, Priscille Heidelberg, MD   1 year ago Diarrhea, unspecified type   [place] [facility] Donita Brooks, MD   1 year ago Rib pain on right side   [place] [facility] Donita Brooks, MD   1 year ago Nausea   [place] [facility] Pickard, Priscille Heidelberg, MD

## 2024-01-17 ENCOUNTER — Encounter: Payer: Self-pay | Admitting: Family Medicine

## 2024-01-17 ENCOUNTER — Ambulatory Visit (INDEPENDENT_AMBULATORY_CARE_PROVIDER_SITE_OTHER): Admitting: Family Medicine

## 2024-01-17 VITALS — BP 126/62 | HR 77 | Temp 97.9°F | Ht 62.0 in | Wt 168.8 lb

## 2024-01-17 DIAGNOSIS — E119 Type 2 diabetes mellitus without complications: Secondary | ICD-10-CM | POA: Diagnosis not present

## 2024-01-17 DIAGNOSIS — Z7984 Long term (current) use of oral hypoglycemic drugs: Secondary | ICD-10-CM

## 2024-01-17 NOTE — Progress Notes (Signed)
 Subjective:    Patient ID: [name], female    DOB: 01/06/1946, 78 y.o.   MRN: [id number]  HPI  Patient has a history of diabetes mellitus type 2.  She is currently on WelChol to help manage her sugar.  We arrived with this due to her inability to tolerate metformin, her history of heart failure so wanted to avoid Actos, her inability to tolerate Farxiga/Jardiance, and the patient did not want to use the GLP-1 agonist.  She is not checking her blood sugar.  She denies any polyuria, polydipsia, blurry vision.  She denies any neuropathy in her feet.  She admits to eating ice cream and cake. Past Medical History:  Diagnosis Date   Allergy 2000   Allergy history unknown    Arthritis    oa   Asthma    takes acolate for   Breast cancer (HCC) 1999; 2008   hx of bilateral breast cancer   Breast cancer, left breast (HCC) 10/27/2012   3.2 cm ER positive, Her-2 negative, 1 node positive S/P mastectomy/TTRAM reconstruction 4/08   Breast cancer, right breast (HCC) 10/27/2012   3.2 cm ER/PR positive, 1 node positive, Her-2 negative Rx w mastectomy, AC -T chemo, followed by aromatase inhibitor x 5 years dx 4/08   Cancer (HCC)    breast CA   Cataracts, bilateral    CHF (congestive heart failure) (HCC)    Dehydration after exertion    after she went berry picking in the summer heat . see ED visit    Diabetes (HCC)    DM2 (diabetes mellitus, type 2) (HCC)    on welchol for   Dyspnea    with exertion   Elevated cholesterol    GERD (gastroesophageal reflux disease)    Hiatal hernia    Hypertension    LBBB (left bundle branch block) 10/27/2012   Leg cramps    NICM (nonischemic cardiomyopathy) (HCC)    a. Echo:  06/14/12: Poor endocardial definition, possible septal HK, EF 50-55%, normal wall motion, mild LAE ;  b.  [facility] 9/13:  normal cors, EF 35-40%,  c. Echo 7/14: EF 50-55%, mild LAE   Other fatigue 01/01/2015   PONV (postoperative nausea and vomiting)    Pulmonary embolus (HCC) 2001    after tram flap surgery   Past Surgical History:  Procedure Laterality Date   ATRIAL FIBRILLATION ABLATION N/A 01/06/2022   Procedure: ATRIAL FIBRILLATION ABLATION;  Surgeon: Regan Lemming, MD;  Location: [facility];  Service: Cardiovascular;  Laterality: N/A;   BREAST SURGERY  2000 &2005   DILATION AND CURETTAGE OF UTERUS   39yrs ago   x 2   EYE SURGERY  2020   JOINT REPLACEMENT     KNEE CLOSED REDUCTION Right 06/01/2018   Procedure: CLOSED MANIPULATION RIGHT KNEE;  Surgeon: Ollen Gross, MD;  Location: [facility];  Service: Orthopedics;  Laterality: Right;   KNEE CLOSED REDUCTION Left 02/22/2019   Procedure: CLOSED MANIPULATION KNEE;  Surgeon: Ollen Gross, MD;  Location: [facility];  Service: Orthopedics;  Laterality: Left;    KNEE SURGERY  2006   left torn cartilage repair   LEFT HEART CATHETERIZATION WITH CORONARY ANGIOGRAM N/A 06/15/2012   Procedure: LEFT HEART CATHETERIZATION WITH CORONARY ANGIOGRAM;  Surgeon: Iran Ouch, MD;  Location: [facility];  Service: Cardiovascular;  Laterality: N/A;   MASTECTOMY     bilateral   RECONSTRUCTION BREAST W/ TRAM FLAP Left 2001   TOTAL KNEE ARTHROPLASTY Right 03/07/2018  Procedure: RIGHT TOTAL KNEE ARTHROPLASTY;  Surgeon: Liliane Rei, MD;  Location: [facility];  Service: Orthopedics;  Laterality: Right;   TOTAL KNEE ARTHROPLASTY Left 10/31/2018   Procedure: TOTAL KNEE ARTHROPLASTY;  Surgeon: Liliane Rei, MD;  Location: [facility];  Service: Orthopedics;  Laterality: Left;    Current Outpatient Medications on File Prior to Visit  Medication Sig Dispense Refill   ACCU-CHEK AVIVA PLUS test strip USE TO CHECK BLOOD SUGAR EVERY MORNING DX: E11.9 100 strip 3   acetaminophen (TYLENOL) 500 MG tablet Take 1,000 mg by mouth every 6 (six) hours as needed for mild pain or moderate pain.     atorvastatin (LIPITOR) 40 MG tablet TAKE 1 TABLET BY MOUTH EVERY DAY 90 tablet 3   bisoprolol (ZEBETA) 5 MG tablet TAKE 1/2 TABLET BY  MOUTH DAILY 45 tablet 3   Blood Glucose Monitoring Suppl (ACCU-CHEK AVIVA PLUS) w/Device KIT Use to check FBS QAM - DX:E11.9 1 kit 1   Cholecalciferol (VITAMIN D3) 50 MCG (2000 UT) CAPS Take 2,000 Units by mouth daily.     clobetasol ointment (TEMOVATE) 0.05 % 1 application. daily as needed (Rash).     Coenzyme Q10 (CO Q 10 PO) Take 400 mg by mouth daily.     colesevelam (WELCHOL) 625 MG tablet TAKE 3 TABLETS BY MOUTH TWICE A DAY WITH A MEAL 540 tablet 0   dexlansoprazole (DEXILANT) 60 MG capsule Take 1 capsule (60 mg total) by mouth daily. 90 capsule 0   ELIQUIS 5 MG TABS tablet TAKE 1 TABLET BY MOUTH TWICE A DAY 180 tablet 3   famotidine (PEPCID) 40 MG tablet Take 1 tablet (40 mg total) by mouth daily. 90 tablet 0   fluticasone (FLONASE) 50 MCG/ACT nasal spray Place 2 sprays into both nostrils daily as needed for allergies or rhinitis.     Ginkgo Biloba 120 MG TABS Take 120 mg by mouth daily.     Glucosamine-Chondroitin (COSAMIN DS PO) Take 1 tablet by mouth 2 (two) times a day.     Lancets (ACCU-CHEK SOFT TOUCH) lancets Use to check BS QAM DX: E11.9 100 each 3   losartan (COZAAR) 100 MG tablet TAKE 1 TABLET BY MOUTH EVERY DAY 90 tablet 1   Multiple Vitamin (MULTIVITAMIN WITH MINERALS) TABS tablet Take 1 tablet by mouth daily. Senior Multivitamin Iron Free Formula     Omega-3 Fatty Acids (OMEGA 3 PO) Take 500 mg by mouth 2 (two) times daily.     spironolactone (ALDACTONE) 25 MG tablet TAKE 1 TABLET (25 MG TOTAL) BY MOUTH DAILY. NEEDS FOLLOW UP APPOINTMENT FOR MORE REFILLS 90 tablet 1   vitamin B-12 (CYANOCOBALAMIN) 1000 MCG tablet Take 1,000 mcg by mouth 2 (two) times daily.     vitamin C (ASCORBIC ACID) 500 MG tablet Take 500 mg by mouth daily. Ultra C     zafirlukast (ACCOLATE) 20 MG tablet Take 1 tablet (20 mg total) by mouth 2 (two) times daily. 180 tablet 4   diltiazem (CARDIZEM) 30 MG tablet TAKE 1 TABLET EVERY 4 HOURS AS NEEDED FOR AFIB HEART RATE >100 (Patient not taking: Reported on  01/17/2024) 45 tablet 1   No current facility-administered medications on file prior to visit.   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   Social Drivers of Corporate investment banker Strain: Low Risk  (07/10/2022)   Overall Financial Resource Strain (CARDIA)    Difficulty of Paying Living Expenses: Not hard at all  Food Insecurity: No Food Insecurity (07/10/2022)   Hunger Vital Sign    Worried About Running Out of Food in the Last Year: Never true    Ran Out of Food in the Last Year: Never true  Transportation Needs: No Transportation Needs (07/10/2022)   PRAPARE - Administrator, Civil Service (Medical): No    Lack of Transportation (Non-Medical): No  Physical Activity: Sufficiently Active (07/10/2022)   Exercise Vital Sign    Days of Exercise per Week: 5 days    Minutes of Exercise per Session: 40 min  Stress: No Stress Concern Present (07/10/2022)   [employer] of Occupational Health - Occupational  Stress Questionnaire    Feeling of Stress : Not at all  Social Connections: Moderately Isolated (07/10/2022)   Social Connection and Isolation Panel [NHANES]    Frequency of Communication with Friends and Family: Twice a week    Frequency of Social Gatherings with Friends and Family: Three times a week    Attends Religious Services: Never    Active Member of Clubs or Organizations: No    Attends Banker Meetings: Never    Marital Status: Married  Catering manager Violence: Not At Risk (07/10/2022)   Humiliation, Afraid, Rape, and Kick questionnaire    Fear of Current or Ex-Partner: No    Emotionally Abused: No    Physically Abused: No    Sexually Abused: No      Review of Systems  All other systems reviewed and are negative.      Objective:   Physical Exam Vitals reviewed.  Constitutional:      General: She is not in acute distress.    Appearance: She is well-developed. She is not diaphoretic.  Cardiovascular:     Rate and Rhythm: Normal rate and regular rhythm.     Heart sounds: Murmur heard.     No friction rub. No gallop.  Pulmonary:     Effort: Pulmonary effort is normal. No respiratory distress.     Breath sounds: Normal breath sounds. No stridor. No wheezing or rales.  Abdominal:     General: Bowel sounds are normal. There is no distension.     Palpations: Abdomen is soft. There is no mass.     Tenderness: There is no abdominal tenderness. There is no guarding or rebound.  Musculoskeletal:     Cervical back: Neck supple.     Right lower  leg: No edema.     Left lower leg: No edema.  Skin:    Coloration: Skin is not jaundiced.  Neurological:     General: No focal deficit present.     Mental Status: She is oriented to person, place, and time. Mental status is at baseline.           Assessment & Plan:  Type 2 diabetes mellitus without complication, without long-term current use of insulin (HCC) - Plan: Hemoglobin A1c, CBC with  Differential/Platelet, COMPLETE METABOLIC PANEL WITHOUT GFR, Microalbumin/Creatinine Ratio, Urine Check hemoglobin A1c.  Goal hemoglobin A1c is less than 7.  Check CMP and urine microalbumin to creatinine ratio.  Patient is not fasting so I cannot check her cholesterol.  Her blood pressure today is excellent.

## 2024-01-18 DIAGNOSIS — D649 Anemia, unspecified: Secondary | ICD-10-CM

## 2024-01-18 HISTORY — DX: Anemia, unspecified: D64.9

## 2024-01-20 ENCOUNTER — Other Ambulatory Visit: Payer: Self-pay

## 2024-01-20 DIAGNOSIS — J4530 Mild persistent asthma, uncomplicated: Secondary | ICD-10-CM

## 2024-01-20 DIAGNOSIS — I428 Other cardiomyopathies: Secondary | ICD-10-CM

## 2024-01-20 DIAGNOSIS — R110 Nausea: Secondary | ICD-10-CM

## 2024-01-20 DIAGNOSIS — I480 Paroxysmal atrial fibrillation: Secondary | ICD-10-CM

## 2024-01-20 DIAGNOSIS — E782 Mixed hyperlipidemia: Secondary | ICD-10-CM

## 2024-01-20 DIAGNOSIS — R0789 Other chest pain: Secondary | ICD-10-CM

## 2024-01-20 DIAGNOSIS — I10 Essential (primary) hypertension: Secondary | ICD-10-CM

## 2024-01-20 DIAGNOSIS — E119 Type 2 diabetes mellitus without complications: Secondary | ICD-10-CM

## 2024-01-20 DIAGNOSIS — R0602 Shortness of breath: Secondary | ICD-10-CM

## 2024-01-20 LAB — CBC WITH DIFFERENTIAL/PLATELET
Absolute Lymphocytes: 2237 {cells}/uL (ref 850–3900)
Absolute Monocytes: 528 {cells}/uL (ref 200–950)
Basophils Absolute: 53 {cells}/uL (ref 0–200)
Basophils Relative: 0.8 %
Eosinophils Absolute: 92 {cells}/uL (ref 15–500)
Eosinophils Relative: 1.4 %
HCT: 27.9 % — ABNORMAL LOW (ref 35.0–45.0)
Hemoglobin: 9 g/dL — ABNORMAL LOW (ref 11.7–15.5)
MCH: 30.4 pg (ref 27.0–33.0)
MCHC: 32.3 g/dL (ref 32.0–36.0)
MCV: 94.3 fL (ref 80.0–100.0)
MPV: 9.8 fL (ref 7.5–12.5)
Monocytes Relative: 8 %
Neutro Abs: 3689 {cells}/uL (ref 1500–7800)
Neutrophils Relative %: 55.9 %
Platelets: 320 10*3/uL (ref 140–400)
RBC: 2.96 10*6/uL — ABNORMAL LOW (ref 3.80–5.10)
RDW: 11.8 % (ref 11.0–15.0)
Total Lymphocyte: 33.9 %
WBC: 6.6 10*3/uL (ref 3.8–10.8)

## 2024-01-20 LAB — COMPLETE METABOLIC PANEL WITHOUT GFR
AG Ratio: 1.9 (calc) (ref 1.0–2.5)
ALT: 17 U/L (ref 6–29)
AST: 17 U/L (ref 10–35)
Albumin: 4.2 g/dL (ref 3.6–5.1)
Alkaline phosphatase (APISO): 77 U/L (ref 37–153)
BUN/Creatinine Ratio: 17 (calc) (ref 6–22)
BUN: 28 mg/dL — ABNORMAL HIGH (ref 7–25)
CO2: 25 mmol/L (ref 20–32)
Calcium: 9.2 mg/dL (ref 8.6–10.4)
Chloride: 106 mmol/L (ref 98–110)
Creat: 1.67 mg/dL — ABNORMAL HIGH (ref 0.60–1.00)
Globulin: 2.2 g/dL (ref 1.9–3.7)
Glucose, Bld: 107 mg/dL — ABNORMAL HIGH (ref 65–99)
Potassium: 4.2 mmol/L (ref 3.5–5.3)
Sodium: 138 mmol/L (ref 135–146)
Total Bilirubin: 0.3 mg/dL (ref 0.2–1.2)
Total Protein: 6.4 g/dL (ref 6.1–8.1)

## 2024-01-20 LAB — HEMOGLOBIN A1C
Hgb A1c MFr Bld: 6.6 % — ABNORMAL HIGH (ref <5.7)
Mean Plasma Glucose: 143 mg/dL
eAG (mmol/L): 7.9 mmol/L

## 2024-01-20 LAB — TEST AUTHORIZATION

## 2024-01-20 LAB — IRON,TIBC AND FERRITIN PANEL
%SAT: 15 % — ABNORMAL LOW (ref 16–45)
Ferritin: 10 ng/mL — ABNORMAL LOW (ref 16–288)
Iron: 61 ug/dL (ref 45–160)
TIBC: 415 ug/dL (ref 250–450)

## 2024-01-20 LAB — VITAMIN B12: Vitamin B-12: >2000 pg/mL — ABNORMAL HIGH (ref 200–1100)

## 2024-01-20 LAB — MICROALBUMIN / CREATININE URINE RATIO
Creatinine, Urine: 83 mg/dL (ref 20–275)
Microalb Creat Ratio: 4 mg/g{creat} (ref <30)
Microalb, Ur: 0.3 mg/dL

## 2024-01-20 MED ORDER — FAMOTIDINE 40 MG PO TABS: 40.0000 mg | ORAL_TABLET | Freq: Every day | ORAL | 1 refills | Status: DC

## 2024-01-20 MED ORDER — ATORVASTATIN CALCIUM 40 MG PO TABS: 40.0000 mg | ORAL_TABLET | Freq: Every day | ORAL | 3 refills | Status: AC

## 2024-01-20 MED ORDER — DEXLANSOPRAZOLE 60 MG PO CPDR: 60.0000 mg | DELAYED_RELEASE_CAPSULE | Freq: Every day | ORAL | 1 refills | Status: DC

## 2024-01-20 MED ORDER — COLESEVELAM HCL 625 MG PO TABS: ORAL_TABLET | ORAL | 1 refills | Status: DC

## 2024-01-20 MED ORDER — ZAFIRLUKAST 20 MG PO TABS: 20.0000 mg | ORAL_TABLET | Freq: Two times a day (BID) | ORAL | 4 refills | Status: AC

## 2024-01-25 ENCOUNTER — Other Ambulatory Visit

## 2024-01-25 DIAGNOSIS — I428 Other cardiomyopathies: Secondary | ICD-10-CM

## 2024-01-25 DIAGNOSIS — I10 Essential (primary) hypertension: Secondary | ICD-10-CM

## 2024-01-25 DIAGNOSIS — R0602 Shortness of breath: Secondary | ICD-10-CM

## 2024-01-26 LAB — BASIC METABOLIC PANEL WITH GFR
BUN: 24 mg/dL (ref 7–25)
CO2: 23 mmol/L (ref 20–32)
Calcium: 9.1 mg/dL (ref 8.6–10.4)
Chloride: 108 mmol/L (ref 98–110)
Creat: 0.98 mg/dL (ref 0.60–1.00)
Glucose, Bld: 161 mg/dL — ABNORMAL HIGH (ref 65–99)
Potassium: 4 mmol/L (ref 3.5–5.3)
Sodium: 138 mmol/L (ref 135–146)
eGFR: 59 mL/min/{1.73_m2} — ABNORMAL LOW (ref >=60)

## 2024-03-10 ENCOUNTER — Ambulatory Visit

## 2024-03-10 ENCOUNTER — Ambulatory Visit (INDEPENDENT_AMBULATORY_CARE_PROVIDER_SITE_OTHER)

## 2024-03-10 VITALS — Ht 62.0 in | Wt 168.8 lb

## 2024-03-10 DIAGNOSIS — Z0000 Encounter for general adult medical examination without abnormal findings: Secondary | ICD-10-CM | POA: Diagnosis not present

## 2024-03-10 NOTE — Progress Notes (Signed)
 Subjective:   [name] is a 78 y.o. female who presents for Medicare Annual (Subsequent) preventive examination.  Visit Complete: Virtual I connected with  [name] on 03/10/24 by a audio enabled telemedicine application and verified that I am speaking with the correct person using two identifiers.  Patient Location: Home  Provider Location: Office/Clinic  I discussed the limitations of evaluation and management by telemedicine. The patient expressed understanding and agreed to proceed.  Vital Signs: Because this visit was a virtual/telehealth visit, some criteria may be missing or patient reported. Any vitals not documented were not able to be obtained and vitals that have been documented are patient reported.  Cardiac Risk Factors include: obesity (BMI >30kg/m2)     Objective:    Today's Vitals   03/10/24 0908  Weight: 168 lb 12.8 oz (76.6 kg)  Height: 5\' 2"  (1.575 m)   Body mass index is 30.87 kg/m.     03/10/2024    9:06 AM 01/19/2023    8:23 PM 07/10/2022    2:21 PM 01/06/2022   11:54 AM 07/04/2021    2:04 PM 12/21/2019    1:03 PM 02/20/2019    4:37 PM  Advanced Directives  Does Patient Have a Medical Advance Directive? Yes No  Yes Yes No No  Type of Estate agent of [place];Living will  Healthcare Power of [place];Living will Healthcare Power of [place];Living will Healthcare Power of [place];Living will    Does patient want to make changes to medical advance directive? No - Patient declined        Copy of Healthcare Power of Attorney in Chart? No - copy requested    No - copy requested    Would patient like information on creating a medical advance directive?       No - Patient declined    Current Medications (verified) Outpatient Encounter Medications as of 03/10/2024  Medication Sig   ACCU-CHEK AVIVA PLUS test strip USE TO CHECK BLOOD SUGAR EVERY MORNING DX: E11.9   acetaminophen  (TYLENOL ) 500 MG tablet Take 1,000 mg by mouth every  6 (six) hours as needed for mild pain or moderate pain.   atorvastatin  (LIPITOR ) 40 MG tablet Take 1 tablet (40 mg total) by mouth daily.   bisoprolol  (ZEBETA ) 5 MG tablet TAKE 1/2 TABLET BY MOUTH DAILY   Blood Glucose Monitoring Suppl (ACCU-CHEK AVIVA PLUS) w/Device KIT Use to check FBS QAM - DX:E11.9   Cholecalciferol (VITAMIN D3) 50 MCG (2000 UT) CAPS Take 2,000 Units by mouth daily.   clobetasol ointment (TEMOVATE) 0.05 % 1 application. daily as needed (Rash).   Coenzyme Q10 (CO Q 10 PO) Take 400 mg by mouth daily.   colesevelam  (WELCHOL ) 625 MG tablet TAKE 3 TABLETS BY MOUTH TWICE A DAY WITH A MEAL   dexlansoprazole  (DEXILANT ) 60 MG capsule Take 1 capsule (60 mg total) by mouth daily.   diltiazem  (CARDIZEM ) 30 MG tablet TAKE 1 TABLET EVERY 4 HOURS AS NEEDED FOR AFIB HEART RATE >100   ELIQUIS  5 MG TABS tablet TAKE 1 TABLET BY MOUTH TWICE A DAY   famotidine  (PEPCID ) 40 MG tablet Take 1 tablet (40 mg total) by mouth daily.   fluticasone  (FLONASE ) 50 MCG/ACT nasal spray Place 2 sprays into both nostrils daily as needed for allergies or rhinitis.   Ginkgo Biloba 120 MG TABS Take 120 mg by mouth daily.   Glucosamine-Chondroitin (COSAMIN DS PO) Take 1 tablet by mouth 2 (two) times a day.   Lancets (ACCU-CHEK  SOFT TOUCH) lancets Use to check BS QAM DX: E11.9   losartan  (COZAAR ) 100 MG tablet TAKE 1 TABLET BY MOUTH EVERY DAY   Multiple Vitamin (MULTIVITAMIN WITH MINERALS) TABS tablet Take 1 tablet by mouth daily. Senior Multivitamin Iron Free Formula   Omega-3 Fatty Acids (OMEGA 3 PO) Take 500 mg by mouth 2 (two) times daily.   spironolactone  (ALDACTONE ) 25 MG tablet TAKE 1 TABLET (25 MG TOTAL) BY MOUTH DAILY. NEEDS FOLLOW UP APPOINTMENT FOR MORE REFILLS   vitamin B-12 (CYANOCOBALAMIN ) 1000 MCG tablet Take 1,000 mcg by mouth 2 (two) times daily.   vitamin C  (ASCORBIC ACID ) 500 MG tablet Take 500 mg by mouth daily. Ultra C   zafirlukast  (ACCOLATE ) 20 MG tablet Take 1 tablet (20 mg total) by mouth 2  (two) times daily.   No facility-administered encounter medications on file as of 03/10/2024.    Allergies (verified) Adhesive [tape], Diphenhydramine , Vicodin [hydrocodone-acetaminophen ], and Feraheme [ferumoxytol ]   History: Past Medical History:  Diagnosis Date   Allergy  2000   Allergy  history unknown    Anemia 01/18/2024   Arthritis    oa   Asthma    takes acolate for   Breast cancer (HCC) 1999; 2008   hx of bilateral breast cancer   Breast cancer, left breast (HCC) 10/27/2012   3.2 cm ER positive, Her-2 negative, 1 node positive S/P mastectomy/TTRAM reconstruction 4/08   Breast cancer, right breast (HCC) 10/27/2012   3.2 cm ER/PR positive, 1 node positive, Her-2 negative Rx w mastectomy, AC -T chemo, followed by aromatase inhibitor x 5 years dx 4/08   Cancer (HCC)    breast CA   Cataracts, bilateral    CHF (congestive heart failure) (HCC)    Dehydration after exertion    after she went berry picking in the summer heat . see ED visit    Diabetes (HCC)    DM2 (diabetes mellitus, type 2) (HCC)    on welchol  for   Dyspnea    with exertion   Elevated cholesterol    GERD (gastroesophageal reflux disease)    Hiatal hernia    Hypertension    LBBB (left bundle branch block) 10/27/2012   Leg cramps    NICM (nonischemic cardiomyopathy) (HCC)    a. Echo:  06/14/12: Poor endocardial definition, possible septal HK, EF 50-55%, normal wall motion, mild LAE ;  b.  [facility] 9/13:  normal cors, EF 35-40%,  c. Echo 7/14: EF 50-55%, mild LAE   Other fatigue 01/01/2015   PONV (postoperative nausea and vomiting)    Pulmonary embolus (HCC) 2001   after tram flap surgery   Past Surgical History:  Procedure Laterality Date   ATRIAL FIBRILLATION ABLATION N/A 01/06/2022   Procedure: ATRIAL FIBRILLATION ABLATION;  Surgeon: Lei Pump, MD;  Location: [facility] INV[facility];  Service: Cardiovascular;  Laterality: N/A;   BREAST SURGERY  2000 &2005   DILATION AND CURETTAGE OF UTERUS   74yrs ago    x 2   EYE SURGERY  2020   JOINT REPLACEMENT     KNEE CLOSED REDUCTION Right 06/01/2018   Procedure: CLOSED MANIPULATION RIGHT KNEE;  Surgeon: Liliane Rei, MD;  Location: [facility];  Service: Orthopedics;  Laterality: Right;   KNEE CLOSED REDUCTION Left 02/22/2019   Procedure: CLOSED MANIPULATION KNEE;  Surgeon: Liliane Rei, MD;  Location: [facility];  Service: Orthopedics;  Laterality: Left;    KNEE SURGERY  2006   left torn cartilage repair   LEFT HEART CATHETERIZATION WITH CORONARY  ANGIOGRAM N/A 06/15/2012   Procedure: LEFT HEART CATHETERIZATION WITH CORONARY ANGIOGRAM;  Surgeon: Wenona Hamilton, MD;  Location: [facility] CATH LAB;  Service: Cardiovascular;  Laterality: N/A;   MASTECTOMY     bilateral   RECONSTRUCTION BREAST W/ TRAM FLAP Left 2001   TOTAL KNEE ARTHROPLASTY Right 03/07/2018   Procedure: RIGHT TOTAL KNEE ARTHROPLASTY;  Surgeon: Liliane Rei, MD;  Location: [facility];  Service: Orthopedics;  Laterality: Right;   TOTAL KNEE ARTHROPLASTY Left 10/31/2018   Procedure: TOTAL KNEE ARTHROPLASTY;  Surgeon: Liliane Rei, MD;  Location: [facility];  Service: Orthopedics;  Laterality: Left;    Family History  Problem Relation Age of Onset   Asthma Maternal Grandmother    Breast cancer Maternal Grandmother        dx. 31s   Stomach cancer Maternal Grandmother 60       lots of stomach problems   Heart attack Paternal Grandfather    Other Daughter        cyst on ovary; unilateral oophorectomy   Heart attack Brother    Cancer Maternal Aunt        unknown type   Celiac disease Maternal Aunt    Diabetes Maternal Uncle    Colon cancer Cousin        dx. 50s-60s   Colon cancer Paternal Aunt        dx. late 79s   Lung cancer Paternal Aunt        dx. late 60s-70s; former smoker   Heart attack Paternal Aunt    Heart attack Paternal Uncle    Social History   Socioeconomic History   Marital status: Married    Spouse name: Dwight   Number of children: 2   Years of education:  Not on file   Highest education level: Not on file  Occupational History   Occupation: retired    Associate Professor: RETIRED    Comment: post Nurse, mental health  Tobacco Use   Smoking status: Never   Smokeless tobacco: Never   Tobacco comments:    Never smoke 01/27/22  Vaping Use   Vaping status: Never Used  Substance and Sexual Activity   Alcohol use: Not Currently   Drug use: Never   Sexual activity: Not Currently    Birth control/protection: None  Other Topics Concern   Not on file  Social History Narrative   1 son and 1 daughter.   Social Drivers of Corporate investment banker Strain: Low Risk  (03/10/2024)   Overall Financial Resource Strain (CARDIA)    Difficulty of Paying Living Expenses: Not hard at all  Food Insecurity: No Food Insecurity (03/10/2024)   Hunger Vital Sign    Worried About Running Out of Food in the Last Year: Never true    Ran Out of Food in the Last Year: Never true  Transportation Needs: No Transportation Needs (03/10/2024)   PRAPARE - Administrator, Civil Service (Medical): No    Lack of Transportation (Non-Medical): No  Physical Activity: Sufficiently Active (03/10/2024)   Exercise Vital Sign    Days of Exercise per Week: 6 days    Minutes of Exercise per Session: 60 min  Stress: No Stress Concern Present (03/10/2024)   [employer] of Occupational Health - Occupational Stress Questionnaire    Feeling of Stress : Not at all  Social Connections: Moderately Isolated (03/10/2024)   Social Connection and Isolation Panel [NHANES]    Frequency of Communication with Friends and Family: More than three  times a week    Frequency of Social Gatherings with Friends and Family: Twice a week    Attends Religious Services: Never    Database administrator or Organizations: No    Attends Banker Meetings: Never    Marital Status: Married    Tobacco Counseling Counseling given: Not Answered Tobacco comments: Never smoke  01/27/22   Clinical Intake:  Pre-visit preparation completed: Yes  Pain : No/denies pain     BMI - recorded: 30.87 (copied from last visit) Nutritional Status: BMI > 30  Obese Nutritional Risks: None Diabetes: Yes CBG done?: No Did pt. bring in CBG monitor from home?: No  How often do you need to have someone help you when you read instructions, pamphlets, or other written materials from your doctor or pharmacy?: 1 - Never What is the last grade level you completed in school?: High school  Interpreter Needed?: No  Information entered by :: Vallerie Gave, CMA   Activities of Daily Living    03/10/2024    9:27 AM 03/10/2024    8:54 AM  In your present state of health, do you have any difficulty performing the following activities:  Hearing? -- 1  Comment Pt states she sometimes needs things repeated   Vision?  0  Difficulty concentrating or making decisions?  0  Walking or climbing stairs?  1  Comment  Pt has had knee replacements- so it is painful.  Dressing or bathing?  0  Doing errands, shopping?  0  Preparing Food and eating ?  N  Using the Toilet?  N  In the past six months, have you accidently leaked urine?  Y  Do you have problems with loss of bowel control?  N  Managing your Medications?  Y  Comment  Husband helps her  Managing your Finances?  N  Housekeeping or managing your Housekeeping?  N    Patient Care Team: Austine Lefort, MD as PCP - General (Family Medicine) Lei Pump, MD as PCP - Electrophysiology (Cardiology) Darlis Eisenmenger, MD as Consulting Physician (Cardiology) Almeda Jacobs, MD as Consulting Physician (Hematology and Oncology)  Indicate any recent Medical Services you may have received from other than [facility] providers in the past year (date may be approximate).     Assessment:   This is a routine wellness examination for [place].  Hearing/Vision screen Hearing Screening - Comments:: Has some trouble hearing Vision  Screening - Comments:: Overdue on eye exam- explained importance of this.   Goals Addressed             This Visit's Progress    Exercise 3x per week (30 min per time)   Not on track    Patient would like to finish home projects        Depression Screen    03/10/2024    9:04 AM 03/10/2024    9:00 AM 11/27/2022    2:12 PM 08/10/2022   12:09 PM 07/10/2022    2:18 PM 07/03/2022   12:21 PM 07/04/2021    2:00 PM  PHQ 2/9 Scores  PHQ - 2 Score 0 0 0 0 0 0 0    Fall Risk    03/10/2024    9:03 AM 11/27/2022    2:12 PM 08/10/2022   12:09 PM 07/06/2022    8:50 AM 07/04/2021    2:05 PM  Fall Risk   Falls in the past year? 0 0 0 0 1  Number falls in past  yr: 0 0 0 0 0  Injury with Fall? 0 0 0 0 0  Risk for fall due to :  No Fall Risks No Fall Risks  History of fall(s);Impaired vision  Follow up  Falls prevention discussed Falls prevention discussed  Falls prevention discussed    MEDICARE RISK AT HOME: Medicare Risk at Home Any stairs in or around the home?: No If so, are there any without handrails?: No Home free of loose throw rugs in walkways, pet beds, electrical cords, etc?: Yes Adequate lighting in your home to reduce risk of falls?: Yes Life alert?: No Use of a cane, walker or w/c?: No Grab bars in the bathroom?: Yes Shower chair or bench in shower?: Yes Elevated toilet seat or a handicapped toilet?: Yes  TIMED UP AND GO:  Was the test performed?  No    Cognitive Function:        03/10/2024    9:30 AM 03/10/2024    9:04 AM 07/10/2022    2:27 PM 07/04/2021    2:09 PM  6CIT Screen  What Year? 0 points 0 points 0 points 0 points  What month? 0 points 0 points 0 points 0 points  What time? 0 points 0 points 0 points 0 points  Count back from 20 0 points 0 points 0 points 0 points  Months in reverse 0 points 0 points 0 points 0 points  Repeat phrase 6 points 6 points 8 points 0 points  Total Score 6 points 6 points 8 points 0 points    Immunizations Immunization History   Administered Date(s) Administered   Fluad Quad(high Dose 65+) 06/09/2019, 06/15/2022   Hep A / Hep B 11/01/2014   Hepatitis A, Ped/Adol-2 Dose 05/24/2014   Influenza Split 08/03/2006, 07/08/2007, 07/10/2010, 06/07/2012, 06/19/2013   Influenza, High Dose Seasonal PF 06/10/2016, 06/16/2017, 07/01/2018, 06/28/2020, 07/01/2021, 06/04/2023   Influenza,inj,quad, With Preservative 06/05/2017   Influenza-Unspecified 07/18/2015, 06/05/2018   Moderna SARS-COV2 Booster Vaccination 08/20/2020   Moderna Sars-Covid-2 Vaccination 10/16/2019, 11/13/2019   Pneumococcal Conjugate-13 01/26/2014   Pneumococcal Polysaccharide-23 07/08/2007, 10/05/2008, 08/12/2017   Tdap 01/28/2017   Zoster, Live 10/05/2008    TDAP status: Up to date  Flu Vaccine status: Up to date  Pneumococcal vaccine status: Up to date  Covid-19 vaccine status: Information provided on how to obtain vaccines.   Qualifies for Shingles Vaccine? Yes   Zostavax completed No   Shingrix Completed?: No.    Education has been provided regarding the importance of this vaccine. Patient has been advised to call insurance company to determine out of pocket expense if they have not yet received this vaccine. Advised may also receive vaccine at local pharmacy or Health Dept. Verbalized acceptance and understanding.  Screening Tests Health Maintenance  Topic Date Due   Zoster Vaccines- Shingrix (1 of 2) 04/23/1965   COVID-19 Vaccine (3 - Moderna risk series) 09/17/2020   FOOT EXAM  05/23/2021   OPHTHALMOLOGY EXAM  03/18/2023   INFLUENZA VACCINE  05/05/2024   HEMOGLOBIN A1C  07/18/2024   Diabetic kidney evaluation - Urine ACR  01/16/2025   Diabetic kidney evaluation - eGFR measurement  01/24/2025   Medicare Annual Wellness (AWV)  03/10/2025   DTaP/Tdap/Td (2 - Td or Tdap) 01/29/2027   Pneumonia Vaccine 40+ Years old  Completed   DEXA SCAN  Completed   Hepatitis C Screening  Completed   HPV VACCINES  Aged Out   Meningococcal B Vaccine   Aged Out   Colonoscopy  Discontinued  Health Maintenance  Health Maintenance Due  Topic Date Due   Zoster Vaccines- Shingrix (1 of 2) 04[birth year]23/1965   COVID-19 Vaccine (3 - Moderna risk series) 09/17/2020   FOOT EXAM  05/23/2021   OPHTHALMOLOGY EXAM  03/18/2023    Colorectal cancer screening: No longer required.   Mammogram status: No longer required due to bilateral mastectomy.  Bone Density status: Completed 02/03/2017. Results reflect: Bone density results: NORMAL. Repeat every 10-15 years.  Lung Cancer Screening: (Low Dose CT Chest recommended if Age 22-80 years, 20 pack-year currently smoking OR have quit w/in 15years.) does not qualify.   Lung Cancer Screening Referral: N/A  Additional Screening:  Hepatitis C Screening: does not qualify; Completed 02/01/2018  Vision Screening: Recommended annual ophthalmology exams for early detection of glaucoma and other disorders of the eye. Is the patient up to date with their annual eye exam?  No  Who is the provider or what is the name of the office in which the patient attends annual eye exams? [facility] If pt is not established with a provider, would they like to be referred to a provider to establish care? No .   Dental Screening: Recommended annual dental exams for proper oral hygiene  Diabetic Foot Exam: Pt is overdue, I explained to her the importance of having a regular diabetic foot exam.  Community Resource Referral / Chronic Care Management: CRR required this visit?  No   CCM required this visit?  No     Plan:     I have personally reviewed and noted the following in the patient's chart:   Medical and social history Use of alcohol, tobacco or illicit drugs  Current medications and supplements including opioid prescriptions. Patient is not currently taking opioid prescriptions. Functional ability and status Nutritional status Physical activity Advanced directives List of other  physicians Hospitalizations, surgeries, and ER visits in previous 12 months Vitals Screenings to include cognitive, depression, and falls Referrals and appointments  In addition, I have reviewed and discussed with patient certain preventive protocols, quality metrics, and best practice recommendations. A written personalized care plan for preventive services as well as general preventive health recommendations were provided to patient.     Vallerie Gave, CMA   03/10/2024   After Visit Summary: (Declined) Due to this being a telephonic visit, with patients personalized plan was offered to patient but patient Declined AVS at this time   Nurse Notes: I enjoyed speaking with Mrs Tullo today.

## 2024-04-05 LAB — HM DIABETES EYE EXAM

## 2024-04-11 ENCOUNTER — Ambulatory Visit (HOSPITAL_COMMUNITY)
Admission: RE | Admit: 2024-04-11 | Discharge: 2024-04-11 | Disposition: A | Discharge status: Home / Self Care | Source: Ambulatory Visit | Attending: Physician Assistant | Admitting: Physician Assistant

## 2024-04-11 ENCOUNTER — Encounter (HOSPITAL_COMMUNITY): Payer: Self-pay | Admitting: Physician Assistant

## 2024-04-11 VITALS — BP 134/80 | HR 78 | Ht 62.0 in | Wt 167.6 lb

## 2024-04-11 DIAGNOSIS — I480 Paroxysmal atrial fibrillation: Secondary | ICD-10-CM | POA: Diagnosis not present

## 2024-04-11 DIAGNOSIS — D6869 Other thrombophilia: Secondary | ICD-10-CM

## 2024-04-11 NOTE — Progress Notes (Signed)
 Primary Care Physician: Duanne Butler DASEN, MD Primary Cardiologist: Dr Rolan Primary Electrophysiologist: Dr Deno Referring Physician: Dr Rolan Guillaume [name] is a 78 y.o. female with a history of HTN, DM, systolic CHF, asthma, HLD, and paroxysmal atrial fibrillation who presents for follow up in the [facility].  The patient was initially diagnosed with atrial fibrillation 12/2014 after presenting with symptoms of increased dyspnea. Her Crist showed atrial fibrillation. Patient is on Eliquis  for a CHADS2VASC score of 6. On 12/21/19, patient reported that she was feeling funny and checked her Crist which showed rapid afib. She took an extra dose of Coreg  but her rapid rates persistent and she presented to the ER. She spontaneously converted in the ER. Patient is s/p afib ablation with Dr Inocencio on 01/06/22.   Patient returns for follow up for atrial fibrillation. She remains in SR today and denies any interim symptoms of afib. No afib on Kardia mobile. She denies any bleeding issues on anticoagulation.   Today, she  denies symptoms of palpitations, chest pain, shortness of breath, orthopnea, PND, lower extremity edema, dizziness, presyncope, syncope, bleeding, or neurologic sequela. The patient is tolerating medications without difficulties and is otherwise without complaint today.    Atrial Fibrillation Risk Factors:  she does have symptoms or diagnosis of sleep apnea. Declined sleep study. she does not have a history of rheumatic fever. she does not have a history of alcohol use. The patient does not have a history of early familial atrial fibrillation or other arrhythmias.   Atrial Fibrillation Management history:  Previous antiarrhythmic drugs: Multaq  Previous cardioversions: none Previous ablations: 01/06/22 Anticoagulation history: Eliquis    Past Medical History:  Diagnosis Date   Allergy  2000   Allergy  history unknown    Anemia 01/18/2024    Arthritis    oa   Asthma    takes acolate for   Breast cancer (HCC) 1999; 2008   hx of bilateral breast cancer   Breast cancer, left breast (HCC) 10/27/2012   3.2 cm ER positive, Her-2 negative, 1 node positive S/P mastectomy/TTRAM reconstruction 4/08   Breast cancer, right breast (HCC) 10/27/2012   3.2 cm ER/PR positive, 1 node positive, Her-2 negative Rx w mastectomy, AC -T chemo, followed by aromatase inhibitor x 5 years dx 4/08   Cancer (HCC)    breast CA   Cataracts, bilateral    CHF (congestive heart failure) (HCC)    Dehydration after exertion    after she went berry picking in the summer heat . see ED visit    Diabetes (HCC)    DM2 (diabetes mellitus, type 2) (HCC)    on welchol  for   Dyspnea    with exertion   Elevated cholesterol    GERD (gastroesophageal reflux disease)    Hiatal hernia    Hypertension    LBBB (left bundle branch block) 10/27/2012   Leg cramps    NICM (nonischemic cardiomyopathy) (HCC)    a. Echo:  06/14/12: Poor endocardial definition, possible septal HK, EF 50-55%, normal wall motion, mild LAE ;  b.  [facility] 9/13:  normal cors, EF 35-40%,  c. Echo 7/14: EF 50-55%, mild LAE   Other fatigue 01/01/2015   PONV (postoperative nausea and vomiting)    Pulmonary embolus (HCC) 2001   after tram flap surgery    ROS- All systems are reviewed and negative except as per the HPI above.  Physical Exam: Vitals:   04/11/24 1100  BP: 134/80  Pulse:  78  Weight: 76 kg  Height: 5' 2 (1.575 m)    GEN: Well nourished, well developed in no acute distress CARDIAC: Regular rate and rhythm, no murmurs, rubs, gallops RESPIRATORY:  Clear to auscultation without rales, wheezing or rhonchi  ABDOMEN: Soft, non-tender, non-distended EXTREMITIES:  No edema; No deformity    Wt Readings from Last 3 Encounters:  04/11/24 76 kg  03/10/24 76.6 kg  01/17/24 76.6 kg    EKG today demonstrates  SR, LBBB Vent. rate 78 BPM PR interval 196 ms QRS duration 158 ms QT/QTcB  426/485 ms   Echo 02/10/22 demonstrated   1. Left ventricular ejection fraction, by estimation, is 55 to 60%. The  left ventricle has normal function. Abnormal (paradoxical) septal motion, consistent with left bundle branch block. Left ventricular diastolic parameters are indeterminate. There is mild left ventricular hypertrophy. Left ventricular diastolic parameters are indeterminate.   2. Right ventricular systolic function is normal. The right ventricular  size is normal. There is normal pulmonary artery systolic pressure. The  estimated right ventricular systolic pressure is 19.0 mmHg.   3. The mitral valve is normal in structure. No evidence of mitral valve  regurgitation. No evidence of mitral stenosis.   4. The aortic valve is tricuspid. Aortic valve regurgitation is not  visualized. No aortic stenosis is present.   5. The inferior vena cava is normal in size with greater than 50%  respiratory variability, suggesting right atrial pressure of 3 mmHg.   Epic records are reviewed at length today  CHA2DS2-VASc Score = 6  The patient's score is based upon: CHF History: 1 HTN History: 1 Diabetes History: 1 Stroke History: 0 Vascular Disease History: 0 Age Score: 2 Gender Score: 1       ASSESSMENT AND PLAN: Paroxysmal Atrial Fibrillation (ICD10:  I48.0) The patient's CHA2DS2-VASc score is 6, indicating a 9.7% annual risk of stroke.   Failed Multaq  due to side effects S/p afib ablation 01/06/22 Patient appears to be maintaining SR Continue bisoprolol  2.5 mg daily Continue Eliquis  5 mg BID Continue diltiazem  30 mg PRN q 4 hours for heart racing Kardia mobile for home monitoring.   Secondary Hypercoagulable State (ICD10:  D68.69) The patient is at significant risk for stroke/thromboembolism based upon her CHA2DS2-VASc Score of 6.  Continue Apixaban  (Eliquis ). No bleeding issues.   HTN Stable on current regimen  NICM EF 55-60% Fluid status appears stable Followed in the  [facility]   Follow up in the AF clinic in one year (patient preference).    Daril Kicks PA-C Af[facility] [address] [place], [place] [address] [phone] 04/11/2024 11:22 AM

## 2024-05-05 ENCOUNTER — Ambulatory Visit (INDEPENDENT_AMBULATORY_CARE_PROVIDER_SITE_OTHER): Admitting: Family Medicine

## 2024-05-05 VITALS — BP 114/70 | HR 75 | Temp 98.0°F | Ht 62.0 in | Wt 169.6 lb

## 2024-05-05 DIAGNOSIS — M7631 Iliotibial band syndrome, right leg: Secondary | ICD-10-CM | POA: Diagnosis not present

## 2024-05-05 DIAGNOSIS — M5431 Sciatica, right side: Secondary | ICD-10-CM | POA: Diagnosis not present

## 2024-05-05 NOTE — Progress Notes (Signed)
 Subjective:    Patient ID: [name], female    DOB: [birth year], 78 y.o.   MRN: [id number]  HPI  Patient presents today complaining of several months of pain in her right leg.  The pain seems to be moving.  The majority the time is located on her right gluteus.  At other times will radiate down her right leg.  Sometimes it will hurt in her anterior right hip.  At other times it will hurt in her right leg and her knee and in her thigh.  She is tender to palpation along the iliotibial band.  Specifically she is tender over the greater trochanteric bursa and over the lateral knee.  However she has no pain with hip abduction against resistance.  Flexion, extension, internal and external rotation of the hip did not elicit any pain in the hip although they do cause pain in the knee joint.  She does have pain radiating from her lateral hip down her leg to her ankle. Past Medical History:  Diagnosis Date   Allergy  2000   Allergy  history unknown    Anemia 01/18/2024   Arthritis    oa   Asthma    takes acolate for   Breast cancer (HCC) 1999; 2008   hx of bilateral breast cancer   Breast cancer, left breast (HCC) 10/27/2012   3.2 cm ER positive, Her-2 negative, 1 node positive S/P mastectomy/TTRAM reconstruction 4/08   Breast cancer, right breast (HCC) 10/27/2012   3.2 cm ER/PR positive, 1 node positive, Her-2 negative Rx w mastectomy, AC -T chemo, followed by aromatase inhibitor x 5 years dx 4/08   Cancer (HCC)    breast CA   Cataracts, bilateral    CHF (congestive heart failure) (HCC)    Dehydration after exertion    after she went berry picking in the summer heat . see ED visit    Diabetes (HCC)    DM2 (diabetes mellitus, type 2) (HCC)    on welchol  for   Dyspnea    with exertion   Elevated cholesterol    GERD (gastroesophageal reflux disease)    Hiatal hernia    Hypertension    LBBB (left bundle branch block) 10/27/2012   Leg cramps    NICM (nonischemic cardiomyopathy) (HCC)     a. Echo:  06/14/12: Poor endocardial definition, possible septal HK, EF 50-55%, normal wall motion, mild LAE ;  b.  [facility] 9/13:  normal cors, EF 35-40%,  c. Echo 7/14: EF 50-55%, mild LAE   Other fatigue 01/01/2015   PONV (postoperative nausea and vomiting)    Pulmonary embolus (HCC) 2001   after tram flap surgery   Past Surgical History:  Procedure Laterality Date   ATRIAL FIBRILLATION ABLATION N/A 01/06/2022   Procedure: ATRIAL FIBRILLATION ABLATION;  Surgeon: Inocencio Soyla Lunger, MD;  Location: [facility];  Service: Cardiovascular;  Laterality: N/A;   BREAST SURGERY  2000 &2005   DILATION AND CURETTAGE OF UTERUS   58yrs ago   x 2   EYE SURGERY  2020   JOINT REPLACEMENT     KNEE CLOSED REDUCTION Right 06/01/2018   Procedure: CLOSED MANIPULATION RIGHT KNEE;  Surgeon: Melodi Lerner, MD;  Location: [facility];  Service: Orthopedics;  Laterality: Right;   KNEE CLOSED REDUCTION Left 02/22/2019   Procedure: CLOSED MANIPULATION KNEE;  Surgeon: Melodi Lerner, MD;  Location: [facility];  Service: Orthopedics;  Laterality: Left;    KNEE SURGERY  2006   left torn cartilage  repair   LEFT HEART CATHETERIZATION WITH CORONARY ANGIOGRAM N/A 06/15/2012   Procedure: LEFT HEART CATHETERIZATION WITH CORONARY ANGIOGRAM;  Surgeon: Deatrice DELENA Cage, MD;  Location: [facility]ATH [facility];  Service: Cardiovascular;  Laterality: N/A;   MASTECTOMY     bilateral   RECONSTRUCTION BREAST W/ TRAM FLAP Left 2001   TOTAL KNEE ARTHROPLASTY Right 03/07/2018   Procedure: RIGHT TOTAL KNEE ARTHROPLASTY;  Surgeon: Melodi Lerner, MD;  Location: [facility];  Service: Orthopedics;  Laterality: Right;   TOTAL KNEE ARTHROPLASTY Left 10/31/2018   Procedure: TOTAL KNEE ARTHROPLASTY;  Surgeon: Melodi Lerner, MD;  Location: [facility];  Service: Orthopedics;  Laterality: Left;    Current Outpatient Medications on File Prior to Visit  Medication Sig Dispense Refill   ACCU-CHEK AVIVA PLUS test strip USE TO CHECK BLOOD SUGAR EVERY MORNING  DX: E11.9 100 strip 3   acetaminophen  (TYLENOL ) 500 MG tablet Take 1,000 mg by mouth every 6 (six) hours as needed for mild pain or moderate pain.     atorvastatin  (LIPITOR ) 40 MG tablet Take 1 tablet (40 mg total) by mouth daily. 90 tablet 3   bisoprolol  (ZEBETA ) 5 MG tablet TAKE 1/2 TABLET BY MOUTH DAILY 45 tablet 3   Blood Glucose Monitoring Suppl (ACCU-CHEK AVIVA PLUS) w/Device KIT Use to check FBS QAM - DX:E11.9 1 kit 1   Cholecalciferol (VITAMIN D3) 50 MCG (2000 UT) CAPS Take 2,000 Units by mouth daily.     clobetasol ointment (TEMOVATE) 0.05 % 1 application. daily as needed (Rash).     Coenzyme Q10 (CO Q 10 PO) Take 400 mg by mouth daily.     colesevelam  (WELCHOL ) 625 MG tablet TAKE 3 TABLETS BY MOUTH TWICE A DAY WITH A MEAL 540 tablet 1   dexlansoprazole  (DEXILANT ) 60 MG capsule Take 1 capsule (60 mg total) by mouth daily. 90 capsule 1   diltiazem  (CARDIZEM ) 30 MG tablet TAKE 1 TABLET EVERY 4 HOURS AS NEEDED FOR AFIB HEART RATE >100 45 tablet 1   ELIQUIS  5 MG TABS tablet TAKE 1 TABLET BY MOUTH TWICE A DAY 180 tablet 3   famotidine  (PEPCID ) 40 MG tablet Take 1 tablet (40 mg total) by mouth daily. 90 tablet 1   fluticasone  (FLONASE ) 50 MCG/ACT nasal spray Place 2 sprays into both nostrils daily as needed for allergies or rhinitis.     Ginkgo Biloba 120 MG TABS Take 120 mg by mouth daily.     Glucosamine-Chondroitin (COSAMIN DS PO) Take 1 tablet by mouth 2 (two) times a day.     Lancets (ACCU-CHEK SOFT TOUCH) lancets Use to check BS QAM DX: E11.9 100 each 3   losartan  (COZAAR ) 100 MG tablet TAKE 1 TABLET BY MOUTH EVERY DAY 90 tablet 1   Multiple Vitamin (MULTIVITAMIN WITH MINERALS) TABS tablet Take 1 tablet by mouth daily. Senior Multivitamin Iron Free Formula     Omega-3 Fatty Acids (OMEGA 3 PO) Take 500 mg by mouth 2 (two) times daily.     spironolactone  (ALDACTONE ) 25 MG tablet TAKE 1 TABLET (25 MG TOTAL) BY MOUTH DAILY. NEEDS FOLLOW UP APPOINTMENT FOR MORE REFILLS 90 tablet 1   vitamin  B-12 (CYANOCOBALAMIN ) 1000 MCG tablet Take 1,000 mcg by mouth 2 (two) times daily.     vitamin C  (ASCORBIC ACID ) 500 MG tablet Take 500 mg by mouth daily. Ultra C     zafirlukast  (ACCOLATE ) 20 MG tablet Take 1 tablet (20 mg total) by mouth 2 (two) times daily. 180 tablet 4   No  current facility-administered medications on file prior to visit.   Allergies  Allergen Reactions   Adhesive [Tape] Other (See Comments)    Skin turns red    Diphenhydramine  Other (See Comments)    Pt feels wired, hyperactive   Vicodin [Hydrocodone-Acetaminophen ] Nausea And Vomiting   Feraheme [Ferumoxytol ] Itching    Patient states she does not remember having trouble or itching with feraheme   Social History   Socioeconomic History   Marital status: Married    Spouse name: Dwight   Number of children: 2   Years of education: Not on file   Highest education level: Not on file  Occupational History   Occupation: retired    Associate Professor: RETIRED    Comment: post Nurse, mental health  Tobacco Use   Smoking status: Never   Smokeless tobacco: Never   Tobacco comments:    Never smoke 01/27/22  Vaping Use   Vaping status: Never Used  Substance and Sexual Activity   Alcohol use: Not Currently   Drug use: Never   Sexual activity: Not Currently    Birth control/protection: None  Other Topics Concern   Not on file  Social History Narrative   1 son and 1 daughter.   Social Drivers of Corporate investment banker Strain: Low Risk  (03/10/2024)   Overall Financial Resource Strain (CARDIA)    Difficulty of Paying Living Expenses: Not hard at all  Food Insecurity: No Food Insecurity (03/10/2024)   Hunger Vital Sign    Worried About Running Out of Food in the Last Year: Never true    Ran Out of Food in the Last Year: Never true  Transportation Needs: No Transportation Needs (03/10/2024)   PRAPARE - Administrator, Civil Service (Medical): No    Lack of Transportation (Non-Medical): No  Physical  Activity: Sufficiently Active (03/10/2024)   Exercise Vital Sign    Days of Exercise per Week: 6 days    Minutes of Exercise per Session: 60 min  Stress: No Stress Concern Present (03/10/2024)   [employer] of Occupational Health - Occupational Stress Questionnaire    Feeling of Stress : Not at all  Social Connections: Moderately Isolated (03/10/2024)   Social Connection and Isolation Panel    Frequency of Communication with Friends and Family: More than three times a week    Frequency of Social Gatherings with Friends and Family: Twice a week    Attends Religious Services: Never    Database administrator or Organizations: No    Attends Banker Meetings: Never    Marital Status: Married  Catering manager Violence: Not At Risk (03/10/2024)   Humiliation, Afraid, Rape, and Kick questionnaire    Fear of Current or Ex-Partner: No    Emotionally Abused: No    Physically Abused: No    Sexually Abused: No      Review of Systems  All other systems reviewed and are negative.      Objective:   Physical Exam Vitals reviewed.  Constitutional:      General: She is not in acute distress.    Appearance: She is well-developed. She is not diaphoretic.  Cardiovascular:     Rate and Rhythm: Normal rate and regular rhythm.     Heart sounds: Murmur heard.     No friction rub. No gallop.  Pulmonary:     Effort: Pulmonary effort is normal. No respiratory distress.     Breath sounds: Normal breath sounds. No stridor. No wheezing or  rales.  Abdominal:     General: Bowel sounds are normal. There is no distension.     Palpations: Abdomen is soft. There is no mass.     Tenderness: There is no abdominal tenderness. There is no guarding or rebound.  Musculoskeletal:     Cervical back: Neck supple.     Right lower leg: No edema.     Left lower leg: No edema.  Skin:    Coloration: Skin is not jaundiced.  Neurological:     General: No focal deficit present.     Mental Status: She is  oriented to person, place, and time. Mental status is at baseline.     Patient has normal range of motion in the right hip.  She has a negative straight leg raise.  Muscle strength is 5/5 equal and symmetric in both legs.  She denies any numbness or tingling in the right leg      Assessment & Plan:  Right sided sciatica - Plan: DG Lumbar Spine Complete  It band syndrome, right - Plan: Ambulatory referral to Physical Therapy I am uncertain of the cause.  I suspect right-sided sciatica with lumbar radiculopathy.  However iliotibial band syndrome is also on the differential diagnosis.  I recommended trying physical therapy.  Patient is already taking Tylenol .  She is unable to take NSAIDs due to Eliquis .  She is allergic to prednisone .  Therefore I recommended trying physical therapy.  Meanwhile I will obtain an x-ray of the lumbar spine to evaluate for any significant degenerative disc disease that may cause lumbar radiculopathy

## 2024-05-16 ENCOUNTER — Encounter (HOSPITAL_COMMUNITY): Payer: Self-pay | Admitting: Cardiology

## 2024-05-16 ENCOUNTER — Ambulatory Visit (HOSPITAL_COMMUNITY)
Admission: RE | Admit: 2024-05-16 | Discharge: 2024-05-16 | Disposition: A | Discharge status: Home / Self Care | Source: Ambulatory Visit | Attending: Cardiology | Admitting: Cardiology

## 2024-05-16 VITALS — BP 118/84 | HR 84 | Ht 62.0 in | Wt 166.8 lb

## 2024-05-16 DIAGNOSIS — I5022 Chronic systolic (congestive) heart failure: Secondary | ICD-10-CM | POA: Diagnosis present

## 2024-05-16 DIAGNOSIS — I428 Other cardiomyopathies: Secondary | ICD-10-CM | POA: Diagnosis not present

## 2024-05-16 DIAGNOSIS — I447 Left bundle-branch block, unspecified: Secondary | ICD-10-CM | POA: Diagnosis not present

## 2024-05-16 DIAGNOSIS — E119 Type 2 diabetes mellitus without complications: Secondary | ICD-10-CM | POA: Insufficient documentation

## 2024-05-16 DIAGNOSIS — I480 Paroxysmal atrial fibrillation: Secondary | ICD-10-CM | POA: Diagnosis not present

## 2024-05-16 DIAGNOSIS — Z7901 Long term (current) use of anticoagulants: Secondary | ICD-10-CM | POA: Diagnosis not present

## 2024-05-16 DIAGNOSIS — I110 Hypertensive heart disease with heart failure: Secondary | ICD-10-CM | POA: Diagnosis present

## 2024-05-16 DIAGNOSIS — E782 Mixed hyperlipidemia: Secondary | ICD-10-CM | POA: Insufficient documentation

## 2024-05-16 DIAGNOSIS — Z79899 Other long term (current) drug therapy: Secondary | ICD-10-CM | POA: Diagnosis not present

## 2024-05-16 LAB — BASIC METABOLIC PANEL WITH GFR
Anion gap: 10 (ref 5–15)
BUN: 23 mg/dL (ref 8–23)
CO2: 22 mmol/L (ref 22–32)
Calcium: 9.4 mg/dL (ref 8.9–10.3)
Chloride: 107 mmol/L (ref 98–111)
Creatinine, Ser: 0.97 mg/dL (ref 0.44–1.00)
GFR, Estimated: 59 mL/min — ABNORMAL LOW (ref >60)
Glucose, Bld: 138 mg/dL — ABNORMAL HIGH (ref 70–99)
Potassium: 3.7 mmol/L (ref 3.5–5.1)
Sodium: 139 mmol/L (ref 135–145)

## 2024-05-16 LAB — LIPID PANEL
Cholesterol: 107 mg/dL (ref 0–200)
HDL: 44 mg/dL (ref >40)
LDL Cholesterol: 39 mg/dL (ref 0–99)
Total CHOL/HDL Ratio: 2.4 ratio
Triglycerides: 118 mg/dL (ref <150)
VLDL: 24 mg/dL (ref 0–40)

## 2024-05-16 LAB — CBC
HCT: 33.9 % — ABNORMAL LOW (ref 36.0–46.0)
Hemoglobin: 11.2 g/dL — ABNORMAL LOW (ref 12.0–15.0)
MCH: 31.2 pg (ref 26.0–34.0)
MCHC: 33 g/dL (ref 30.0–36.0)
MCV: 94.4 fL (ref 80.0–100.0)
Platelets: 291 K/uL (ref 150–400)
RBC: 3.59 MIL/uL — ABNORMAL LOW (ref 3.87–5.11)
RDW: 12.5 % (ref 11.5–15.5)
WBC: 6 K/uL (ref 4.0–10.5)
nRBC: 0 % (ref 0.0–0.2)

## 2024-05-16 NOTE — Progress Notes (Signed)
 Patient ID: [name], female   DOB: Feb 03, 1946, 78 y.o.   MRN: [id number]    Advanced Heart Failure Clinic Note   PCP: Dr. Duanne HF: Dr. Rolan   Chief complaint: CHF  [name] is a 78 y.o. female with HTN and DMII presents for followup of CHF.  Back in 2009, she had an echo with normal EF.  She was admitted in 9/13 with chest pain and LBBB.  She had LHC showing no CAD but EF 35-40%.  Echo was a technically difficult study with EF reported as 50-55%.  Had been taking ARB and Coreg .  Repeat echo in 12/13 showed EF 40% with septal hypokinesis.  Cardiac MRI was done in 12/13 as well, with calculated EF 50%, global hypokinesis, and no delayed enhancement.  Echo in 7/15 showed EF 55-60% with mild MR and mild TR (no LBBB at this time).   In 3/16, she began to develop increased dyspnea.  She was seen by Katheryn Jude and was noted to have a LBBB again. Echo in 3/16 showed EF down to 35-40%.  She was then seen in followup by Shona Shad.  Alivecor on her phone showed a period of HR up to 160s that appeared to be atrial fibrillation.  She felt tachypalpitations. Coreg  was increased and she was started on apixaban  5 mg bid. CPX in 4/16 showed a submaximal effort but normal spirometry and normal peak VO2.  She did have ST changes possibly concerning for ischemia (but has baseline LBBB so hard to interpret).    Cardiolite  in 5/16 showed on ischemia or infarction.  Most recent echo in 2/17 showed EF 45-50%, diffuse hypokinesis, grade I diastolic dysfunction, normal RV size and systolic function.  Echo 1/18 with EF stable at 45%.  Echo in 4/19 showed EF 50-55% with septal dyssynergy.   Echo in 1/21 showed EF 40-45%, normal RV.  Cardiac MRI was done in 5/21, showing LV EF 54%, septal-lateral dyssynchrony c/w LBBB, RV EF 53%, no LGE.   Patient had COVID-19 in 7/22.   Echo 12/22 showed EF 40-45%, septal-lateral dyssynchrony, normal RV.    Patient had atrial fibrillation ablation in 4/23.  Zio  monitor in 5/23 showed no AF.  Echo in 5/23 showed EF 55-60%, paradoxical septal motion, normal RV.  Coronary CTA was done in 11/23 due to atypical chest pain, showing no significant CAD and calcium  score in the 29th percentile.   Echo 5/24 EF 55-60%, GIDD, RV normal. Zio 5/24: Rare short SVT runs, predominantly NSR  Today she returns for AHF follow up. She uses her Kardia mobile occasionally, has not noted atrial fibrillation.  Mild dyspnea carrying a heavy load, but no dyspnea walking on flat ground or doing ADLs.  She has been doing a lot of gardening this summer.  Weight down 11 lbs.  No palpitations or lightheadedness.   ECG (personally reviewed): NSR, LBBB 156 msec  Labs (4/24): K 4.1, creatinine 1.10 Labs (5/24): tSat 10, Ferritin 8 Labs (4/25): K 4, creatinine 0.91  PMH: 1. LBBB/IVCD: Now persistent.   2. Breast cancer s/p bilateral mastectomy. 3. GERD 4. Type II diabetes mellitus 5. HTN 6. Asthma 7. PE in 2001 8. Hyperlipidemia 9. Nonischemic Cardiomyopathy: Possible LBBB cardiomyopathy. Echo 8/09 with 60%.  Admitted in 9/13 with chest pain.  LHC showed no angiographic CAD and EF 35-40%.  Echo at that time was read as showing EF 50-55% but was a technically difficult study.  Echo (12/13): EF 40% with septal hypokinesis,  normal RV size and systolic function, no significant valvular abnormalities.  Cardiac MRI (12/13): EF 50%, global hypokinesis, no delayed enhancement.  Echo (7/14) with EF 50-55%, mild LV dilation, mild LAE.  Echo (7/15) with EF 55-60%, mild MR and mild TR.  Echo (3/16) with EF 35-40%, septal-lateral dyssynchrony.  CPX (4/16) with RER 0.98 (submaximal), peak VO2 19.5, VE/VCO2 slope 29.4 => normal spirometry, normal peak VO2, but ST changes suggest possibility of ischemia.  Lexiscan  Cardiolite  (5/16) with no ischemia/infarction. - Echo (2/17) with EF 45-50%, diffuse hypokinesis, grade I diastolic dysfunction, normal RV size and systolic function.  - Echo (1/18) with EF  45%, septal-lateral dyssynchrony, septal hypokinesis, normal RV size and systolic function.  - Echo (4/19) with EF 50-55%, mild LVH, septal-lateral dyssynchony.  - Cardiac MRI (5/21): LV EF 54%, septal-lateral dyssynchrony c/w LBBB, RV EF 53%, no LGE.  - Echo (12/22): EF 40-45%, septal-lateral dyssynchrony, normal RV - Echo (5/23): EF 55-60%, paradoxical septal motion, normal RV.  - Echo (5/24):  EF 55-60%, GIDD, RV normal.  - Coronary CTA (11/23): no significant CAD and calcium  score in the 29th percentile. 10. Supraclavicular lymphadenopathy: Stable by CT back to 2009, suspect benign.  11. Atrial fibrillation: Paroxysmal.  Holter (4/16) with rare PACs and PVCs, short run 5 beats SVT.  - Zio 5/24: Rare short SVT runs, predominantly NSR - Atrial fibrillation transiently in 1/21.  - Did not tolerate Multaq .  - Atrial fibrillation ablation in 4/23.  12. Right TKR 6/19, left TKR 1/20 13. COVID-19 7/22 14. Anemia  SH: Married, lives in [facility], never smoked.  FH: Uncle with ? SCD.  Brother with ? SCD.    ROS: All systems reviewed and negative except as per HPI.   Current Outpatient Medications  Medication Sig Dispense Refill   ACCU-CHEK AVIVA PLUS test strip USE TO CHECK BLOOD SUGAR EVERY MORNING DX: E11.9 100 strip 3   acetaminophen  (TYLENOL ) 500 MG tablet Take 1,000 mg by mouth every 6 (six) hours as needed for mild pain or moderate pain.     atorvastatin  (LIPITOR ) 40 MG tablet Take 1 tablet (40 mg total) by mouth daily. 90 tablet 3   bisoprolol  (ZEBETA ) 5 MG tablet TAKE 1/2 TABLET BY MOUTH DAILY 45 tablet 3   Blood Glucose Monitoring Suppl (ACCU-CHEK AVIVA PLUS) w/Device KIT Use to check FBS QAM - DX:E11.9 1 kit 1   Cholecalciferol (VITAMIN D3) 50 MCG (2000 UT) CAPS Take 2,000 Units by mouth daily.     clobetasol ointment (TEMOVATE) 0.05 % 1 application. daily as needed (Rash).     Coenzyme Q10 (CO Q 10 PO) Take 400 mg by mouth daily.     colesevelam  (WELCHOL ) 625 MG  tablet TAKE 3 TABLETS BY MOUTH TWICE A DAY WITH A MEAL 540 tablet 1   dexlansoprazole  (DEXILANT ) 60 MG capsule Take 1 capsule (60 mg total) by mouth daily. 90 capsule 1   diltiazem  (CARDIZEM ) 30 MG tablet TAKE 1 TABLET EVERY 4 HOURS AS NEEDED FOR AFIB HEART RATE >100 45 tablet 1   ELIQUIS  5 MG TABS tablet TAKE 1 TABLET BY MOUTH TWICE A DAY 180 tablet 3   famotidine  (PEPCID ) 40 MG tablet Take 1 tablet (40 mg total) by mouth daily. 90 tablet 1   fluticasone  (FLONASE ) 50 MCG/ACT nasal spray Place 2 sprays into both nostrils daily as needed for allergies or rhinitis.     Ginkgo Biloba 120 MG TABS Take 120 mg by mouth daily.     Glucosamine-Chondroitin (  COSAMIN DS PO) Take 1 tablet by mouth 2 (two) times a day.     Lancets (ACCU-CHEK SOFT TOUCH) lancets Use to check BS QAM DX: E11.9 100 each 3   losartan  (COZAAR ) 100 MG tablet TAKE 1 TABLET BY MOUTH EVERY DAY 90 tablet 1   Multiple Vitamin (MULTIVITAMIN WITH MINERALS) TABS tablet Take 1 tablet by mouth daily. Senior Multivitamin Iron Free Formula     Omega-3 Fatty Acids (OMEGA 3 PO) Take 500 mg by mouth 2 (two) times daily.     spironolactone  (ALDACTONE ) 25 MG tablet TAKE 1 TABLET (25 MG TOTAL) BY MOUTH DAILY. NEEDS FOLLOW UP APPOINTMENT FOR MORE REFILLS 90 tablet 1   vitamin B-12 (CYANOCOBALAMIN ) 1000 MCG tablet Take 1,000 mcg by mouth 2 (two) times daily.     vitamin C  (ASCORBIC ACID ) 500 MG tablet Take 500 mg by mouth daily. Ultra C     zafirlukast  (ACCOLATE ) 20 MG tablet Take 1 tablet (20 mg total) by mouth 2 (two) times daily. 180 tablet 4   No current facility-administered medications for this encounter.    BP 118/84   Pulse 84   Ht 5' 2 (1.575 m)   Wt 75.7 kg (166 lb 12.8 oz)   SpO2 98%   BMI 30.51 kg/m    Wt Readings from Last 3 Encounters:  05/16/24 75.7 kg (166 lb 12.8 oz)  05/05/24 76.9 kg (169 lb 9.6 oz)  04/11/24 76 kg (167 lb 9.6 oz)  General: NAD Neck: No JVD, no thyromegaly or thyroid  nodule.  Lungs: Clear to  auscultation bilaterally with normal respiratory effort. CV: Nondisplaced PMI.  Heart regular S1/S2, no S3/S4, no murmur.  No peripheral edema.  No carotid bruit.  Normal pedal pulses.  Abdomen: Soft, nontender, no hepatosplenomegaly, no distention.  Skin: Intact without lesions or rashes.  Neurologic: Alert and oriented x 3.  Psych: Normal affect. Extremities: No clubbing or cyanosis.  HEENT: Normal.   Assessment/Plan: 1. Nonischemic cardiomyopathy:   No CAD on cath 9/13 but EF 35-40% at that time.  ECG showed LBBB.  LBBB resolved and EF normalized.  However, in 3/16, patient had fatigue and dyspnea => repeat echo with EF back down to 35-40%, she now has a persistent LBBB. This may be a LBBB cardiomyopathy.  CPX in 4/16 was submaximal but showed normal spirometry and actually near normal peak VO2.  Echo in 4/19 showed EF stable at 50-55% with septal-lateral dyssynchrony.  Cardiac MRI in 5/21 showed LV EF 54%, septal-lateral dyssynchrony c/w LBBB, RV EF 53%, no LGE.  Echo in 12/22 showed EF 40-45%, septal-lateral dyssynchrony, normal RV.  Echo in 5/23 showed EF 55-60%, paradoxical septal motion, normal RV.  Coronary CTA in 11/23 with no significant coronary disease. Echo 5/24 EF 55-60%, GIDD, RV normal. NYHA class I-II, not volume overloaded on exam.  - I will arrange for repeat echo this year.  - Continue bisoprolol  2.5 mg daily.   - She did not tolerate Entresto  well, continue losartan  (and EF has recovered).  - Continue spironolactone  25 mg daily. BMET today.  - Intolerable side effects with Farxiga .   2. Hyperlipidemia:  Continue atorvastatin  40 mg qhs. Check lipids today.  3. Atrial fibrillation: She is significantly symptomatic when in AF.  She was unable to take Multaq  due to side effects. S/p atrial fibrillation ablation in 4/23.  No recent AF recurence. - Continue Eliquis  5 mg bid.  CBC today 4. LBBB: Persistent and may have been cause of mild cardiomyopathy.  Followup in 1 year if  echo remains stable.   I spent 31 minutes reviewing records, interviewing/examining patient, and managing orders.   Ezra Shuck 05/16/2024 [facility] [place] [address] [facility] [place] [address] [phone] (office) [phone] (fax)

## 2024-05-16 NOTE — Patient Instructions (Signed)
 Great to see you today!!!  Labs done today, your results will be available in MyChart, we will contact you for abnormal readings.  Your physician has requested that you have an echocardiogram. Echocardiography is a painless test that uses sound waves to create images of your heart. It provides your doctor with information about the size and shape of your heart and how well your heart's chambers and valves are working. This procedure takes approximately one hour. There are no restrictions for this procedure. Please do NOT wear cologne, perfume, aftershave, or lotions (deodorant is allowed). Please arrive 15 minutes prior to your appointment time.  Please note: We ask at that you not bring children with you during ultrasound (echo/ vascular) testing. Due to room size and safety concerns, children are not allowed in the ultrasound rooms during exams. Our front office staff cannot provide observation of children in our lobby area while testing is being conducted. An adult accompanying a patient to their appointment will only be allowed in the ultrasound room at the discretion of the ultrasound technician under special circumstances. We apologize for any inconvenience.   Your physician recommends that you schedule a follow-up appointment in: 1 year (August 2026), **PLEASE CALL OUR OFFICE IN JUNE TO SCHEDULE THIS APPOINTMENT  If you have any questions or concerns before your next appointment please send us  a message through [place] or call our office at [phone].    TO LEAVE A MESSAGE FOR THE NURSE SELECT OPTION 2, PLEASE LEAVE A MESSAGE INCLUDING: YOUR NAME DATE OF BIRTH CALL BACK NUMBER REASON FOR CALL**this is important as we prioritize the call backs  YOU WILL RECEIVE A CALL BACK THE SAME DAY AS LONG AS YOU CALL BEFORE 4:00 PM  At the [facility], you and your health needs are our priority. As part of our continuing mission to provide you with exceptional heart care, we  have created designated Provider Care Teams. These Care Teams include your primary Cardiologist (physician) and Advanced Practice Providers (APPs- Physician Assistants and Nurse Practitioners) who all work together to provide you with the care you need, when you need it.   You may see any of the following providers on your designated Care Team at your next follow up: Dr Toribio Fuel Dr Ezra Shuck Dr. Ria Commander Dr. Morene Brownie Amy Lenetta, NP Caffie Shed, [place] [facility], [place] Beckey Coe, NP [place] Lee, NP Ellouise Class, NP Tinnie Redman, PharmD Jaun Bash, PharmD   Please be sure to bring in all your medications bottles to every appointment.    Thank you for choosing [facility]

## 2024-05-17 ENCOUNTER — Ambulatory Visit (HOSPITAL_COMMUNITY): Payer: Self-pay | Admitting: Cardiology

## 2024-05-31 ENCOUNTER — Other Ambulatory Visit (HOSPITAL_COMMUNITY): Payer: Self-pay | Admitting: Internal Medicine

## 2024-06-30 ENCOUNTER — Ambulatory Visit (HOSPITAL_COMMUNITY)
Admission: RE | Admit: 2024-06-30 | Discharge: 2024-06-30 | Disposition: A | Discharge status: Home / Self Care | Source: Ambulatory Visit | Attending: Cardiology | Admitting: Cardiology

## 2024-06-30 DIAGNOSIS — I429 Cardiomyopathy, unspecified: Secondary | ICD-10-CM | POA: Diagnosis not present

## 2024-06-30 DIAGNOSIS — I5022 Chronic systolic (congestive) heart failure: Secondary | ICD-10-CM | POA: Diagnosis present

## 2024-06-30 LAB — ECHOCARDIOGRAM COMPLETE
AR max vel: 2.21 cm2
AV Area VTI: 2.26 cm2
AV Area mean vel: 2.45 cm2
AV Mean grad: 4 mmHg
AV Peak grad: 8.1 mmHg
Ao pk vel: 1.42 m/s
Area-P 1/2: 3.7 cm2
Calc EF: 56.7 %
MV VTI: 2.31 cm2
S' Lateral: 3.45 cm
Single Plane A2C EF: 57.8 %
Single Plane A4C EF: 53.8 %

## 2024-07-07 ENCOUNTER — Other Ambulatory Visit: Payer: Self-pay | Admitting: Family Medicine

## 2024-07-07 DIAGNOSIS — E782 Mixed hyperlipidemia: Secondary | ICD-10-CM

## 2024-07-10 ENCOUNTER — Other Ambulatory Visit: Payer: Self-pay | Admitting: Family Medicine

## 2024-07-10 DIAGNOSIS — R0602 Shortness of breath: Secondary | ICD-10-CM

## 2024-07-10 DIAGNOSIS — R0789 Other chest pain: Secondary | ICD-10-CM

## 2024-08-27 ENCOUNTER — Other Ambulatory Visit (HOSPITAL_COMMUNITY): Payer: Self-pay | Admitting: Cardiology

## 2024-09-09 ENCOUNTER — Other Ambulatory Visit (HOSPITAL_COMMUNITY): Payer: Self-pay | Admitting: Cardiology

## 2024-09-18 ENCOUNTER — Other Ambulatory Visit: Payer: Self-pay

## 2024-09-18 ENCOUNTER — Telehealth: Payer: Self-pay

## 2024-09-18 DIAGNOSIS — R110 Nausea: Secondary | ICD-10-CM

## 2024-09-18 MED ORDER — FAMOTIDINE 40 MG PO TABS: 40.0000 mg | ORAL_TABLET | Freq: Every day | ORAL | 1 refills | Status: AC

## 2024-09-18 NOTE — Telephone Encounter (Signed)
 Prescription Request  09/18/2024  LOV: 05/05/24  What is the name of the medication or equipment? famotidine  (PEPCID ) 40 MG tablet [[id number]]   Have you contacted your pharmacy to request a refill? Yes   Which pharmacy would you like this sent to?  [id number] [place], [place] - [address] [place] [address] Phone: [phone] Fax: [phone]    Patient notified that their request is being sent to the clinical staff for review and that they should receive a response within 2 business days.   Please advise at [facility] [phone]

## 2024-09-18 NOTE — Telephone Encounter (Signed)
 Sent in medication
